# Patient Record
Sex: Male | Born: 1935 | Race: White | Hispanic: No | Smoking: Former smoker
Health system: Southern US, Community
[De-identification: ages and names within clinical notes are randomized; demographics above are authoritative.]

## PROBLEM LIST (undated history)

## (undated) DIAGNOSIS — I10 Essential (primary) hypertension: Secondary | ICD-10-CM

## (undated) DIAGNOSIS — R001 Bradycardia, unspecified: Secondary | ICD-10-CM

## (undated) DIAGNOSIS — J189 Pneumonia, unspecified organism: Secondary | ICD-10-CM

## (undated) DIAGNOSIS — I110 Hypertensive heart disease with heart failure: Secondary | ICD-10-CM

## (undated) DIAGNOSIS — R2681 Unsteadiness on feet: Secondary | ICD-10-CM

## (undated) DIAGNOSIS — I2510 Atherosclerotic heart disease of native coronary artery without angina pectoris: Secondary | ICD-10-CM

## (undated) DIAGNOSIS — N529 Male erectile dysfunction, unspecified: Secondary | ICD-10-CM

## (undated) DIAGNOSIS — R413 Other amnesia: Secondary | ICD-10-CM

## (undated) DIAGNOSIS — Z86010 Personal history of colonic polyps: Secondary | ICD-10-CM

## (undated) DIAGNOSIS — J309 Allergic rhinitis, unspecified: Secondary | ICD-10-CM

## (undated) DIAGNOSIS — Z9181 History of falling: Secondary | ICD-10-CM

## (undated) DIAGNOSIS — M6281 Muscle weakness (generalized): Secondary | ICD-10-CM

## (undated) DIAGNOSIS — R600 Localized edema: Secondary | ICD-10-CM

## (undated) DIAGNOSIS — K2100 Gastro-esophageal reflux disease with esophagitis, without bleeding: Secondary | ICD-10-CM

## (undated) DIAGNOSIS — N400 Enlarged prostate without lower urinary tract symptoms: Secondary | ICD-10-CM

## (undated) DIAGNOSIS — Z9889 Other specified postprocedural states: Secondary | ICD-10-CM

## (undated) DIAGNOSIS — R51 Headache: Secondary | ICD-10-CM

## (undated) DIAGNOSIS — R42 Dizziness and giddiness: Secondary | ICD-10-CM

## (undated) DIAGNOSIS — I5032 Chronic diastolic (congestive) heart failure: Secondary | ICD-10-CM

## (undated) DIAGNOSIS — E559 Vitamin D deficiency, unspecified: Secondary | ICD-10-CM

## (undated) DIAGNOSIS — H9190 Unspecified hearing loss, unspecified ear: Secondary | ICD-10-CM

## (undated) DIAGNOSIS — K635 Polyp of colon: Secondary | ICD-10-CM

## (undated) DIAGNOSIS — A419 Sepsis, unspecified organism: Secondary | ICD-10-CM

## (undated) DIAGNOSIS — K649 Unspecified hemorrhoids: Secondary | ICD-10-CM

## (undated) DIAGNOSIS — I340 Nonrheumatic mitral (valve) insufficiency: Secondary | ICD-10-CM

## (undated) DIAGNOSIS — M150 Primary generalized (osteo)arthritis: Secondary | ICD-10-CM

## (undated) DIAGNOSIS — A048 Other specified bacterial intestinal infections: Secondary | ICD-10-CM

## (undated) DIAGNOSIS — E785 Hyperlipidemia, unspecified: Secondary | ICD-10-CM

## (undated) DIAGNOSIS — K648 Other hemorrhoids: Secondary | ICD-10-CM

## (undated) DIAGNOSIS — G9341 Metabolic encephalopathy: Secondary | ICD-10-CM

## (undated) DIAGNOSIS — E876 Hypokalemia: Secondary | ICD-10-CM

## (undated) HISTORY — DX: Unsteadiness on feet: R26.81

## (undated) HISTORY — DX: Atherosclerotic heart disease of native coronary artery without angina pectoris: I25.10

## (undated) HISTORY — DX: Hypokalemia: E87.6

## (undated) HISTORY — DX: Allergic rhinitis, unspecified: J30.9

## (undated) HISTORY — DX: Localized edema: R60.0

## (undated) HISTORY — DX: Vitamin D deficiency, unspecified: E55.9

## (undated) HISTORY — PX: APPENDECTOMY: SHX54

## (undated) HISTORY — DX: Metabolic encephalopathy: G93.41

## (undated) HISTORY — DX: Unspecified hemorrhoids: K64.9

## (undated) HISTORY — DX: Unspecified hearing loss, unspecified ear: H91.90

## (undated) HISTORY — DX: Dizziness and giddiness: R42

## (undated) HISTORY — DX: Polyp of colon: K63.5

## (undated) HISTORY — PX: OTHER SURGICAL HISTORY: SHX169

## (undated) HISTORY — DX: Other specified postprocedural states: Z98.890

## (undated) HISTORY — DX: Muscle weakness (generalized): M62.81

## (undated) HISTORY — PX: TONSILLECTOMY: SUR1361

## (undated) HISTORY — DX: Hyperlipidemia, unspecified: E78.5

## (undated) HISTORY — DX: Gastro-esophageal reflux disease with esophagitis, without bleeding: K21.00

## (undated) HISTORY — DX: Primary generalized (osteo)arthritis: M15.0

## (undated) HISTORY — DX: Other hemorrhoids: K64.8

## (undated) HISTORY — PX: UPPER GASTROINTESTINAL ENDOSCOPY: SHX188

## (undated) HISTORY — DX: Chronic diastolic (congestive) heart failure: I50.32

## (undated) HISTORY — DX: Benign prostatic hyperplasia without lower urinary tract symptoms: N40.0

## (undated) HISTORY — DX: Pneumonia, unspecified organism: J18.9

## (undated) HISTORY — DX: Hypertensive heart disease with heart failure: I11.0

## (undated) HISTORY — PX: HEMORROIDECTOMY: SUR656

## (undated) HISTORY — DX: Male erectile dysfunction, unspecified: N52.9

## (undated) HISTORY — DX: Bradycardia, unspecified: R00.1

## (undated) HISTORY — DX: Other amnesia: R41.3

## (undated) HISTORY — PX: COLONOSCOPY: SHX174

## (undated) HISTORY — DX: Personal history of colonic polyps: Z86.010

## (undated) HISTORY — DX: Essential (primary) hypertension: I10

## (undated) HISTORY — DX: History of falling: Z91.81

## (undated) HISTORY — PX: CATARACT EXTRACTION, BILATERAL: SHX1313

## (undated) HISTORY — DX: Other specified bacterial intestinal infections: A04.8

## (undated) HISTORY — DX: Nonrheumatic mitral (valve) insufficiency: I34.0

## (undated) HISTORY — DX: Gastro-esophageal reflux disease with esophagitis: K21.0

## (undated) HISTORY — PX: NASAL SEPTUM SURGERY: SHX37

## (undated) HISTORY — DX: Headache: R51

---
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
## 1995-06-17 HISTORY — PX: CARDIAC CATHETERIZATION: SHX172

## 1998-03-06 ENCOUNTER — Ambulatory Visit (HOSPITAL_COMMUNITY): Admission: RE | Admit: 1998-03-06 | Discharge: 1998-03-06 | Payer: Self-pay | Admitting: Internal Medicine

## 1998-03-06 ENCOUNTER — Encounter: Payer: Self-pay | Admitting: Internal Medicine

## 2000-05-26 ENCOUNTER — Ambulatory Visit (HOSPITAL_BASED_OUTPATIENT_CLINIC_OR_DEPARTMENT_OTHER): Admission: RE | Admit: 2000-05-26 | Discharge: 2000-05-26 | Payer: Self-pay | Admitting: General Surgery

## 2000-05-26 ENCOUNTER — Encounter (INDEPENDENT_AMBULATORY_CARE_PROVIDER_SITE_OTHER): Payer: Self-pay | Admitting: *Deleted

## 2001-12-29 ENCOUNTER — Ambulatory Visit (HOSPITAL_COMMUNITY): Admission: RE | Admit: 2001-12-29 | Discharge: 2001-12-29 | Payer: Self-pay | Admitting: *Deleted

## 2001-12-29 ENCOUNTER — Encounter (INDEPENDENT_AMBULATORY_CARE_PROVIDER_SITE_OTHER): Payer: Self-pay | Admitting: *Deleted

## 2002-03-30 ENCOUNTER — Ambulatory Visit (HOSPITAL_COMMUNITY): Admission: RE | Admit: 2002-03-30 | Discharge: 2002-03-30 | Payer: Self-pay | Admitting: *Deleted

## 2004-06-16 HISTORY — PX: OTHER SURGICAL HISTORY: SHX169

## 2004-06-16 HISTORY — PX: INGUINAL HERNIA REPAIR: SUR1180

## 2004-11-18 ENCOUNTER — Encounter: Admission: RE | Admit: 2004-11-18 | Discharge: 2004-11-18 | Payer: Self-pay | Admitting: Orthopedic Surgery

## 2004-12-11 ENCOUNTER — Ambulatory Visit (HOSPITAL_BASED_OUTPATIENT_CLINIC_OR_DEPARTMENT_OTHER): Admission: RE | Admit: 2004-12-11 | Discharge: 2004-12-12 | Payer: Self-pay | Admitting: General Surgery

## 2004-12-11 ENCOUNTER — Ambulatory Visit (HOSPITAL_COMMUNITY): Admission: RE | Admit: 2004-12-11 | Discharge: 2004-12-11 | Payer: Self-pay | Admitting: General Surgery

## 2004-12-31 ENCOUNTER — Inpatient Hospital Stay (HOSPITAL_COMMUNITY): Admission: RE | Admit: 2004-12-31 | Discharge: 2005-01-04 | Payer: Self-pay | Admitting: Orthopedic Surgery

## 2004-12-31 ENCOUNTER — Encounter (INDEPENDENT_AMBULATORY_CARE_PROVIDER_SITE_OTHER): Payer: Self-pay | Admitting: Specialist

## 2005-01-04 ENCOUNTER — Ambulatory Visit: Payer: Self-pay | Admitting: Internal Medicine

## 2007-06-17 DIAGNOSIS — Z860100 Personal history of colon polyps, unspecified: Secondary | ICD-10-CM

## 2007-06-17 DIAGNOSIS — K648 Other hemorrhoids: Secondary | ICD-10-CM

## 2007-06-17 DIAGNOSIS — Z9889 Other specified postprocedural states: Secondary | ICD-10-CM

## 2007-06-17 HISTORY — DX: Other hemorrhoids: K64.8

## 2007-06-17 HISTORY — DX: Other specified postprocedural states: Z98.890

## 2007-06-17 HISTORY — DX: Personal history of colon polyps, unspecified: Z86.0100

## 2007-06-17 HISTORY — DX: Personal history of colonic polyps: Z86.010

## 2008-01-07 ENCOUNTER — Ambulatory Visit: Admission: RE | Admit: 2008-01-07 | Discharge: 2008-01-07 | Payer: Self-pay | Admitting: Orthopedic Surgery

## 2008-01-07 ENCOUNTER — Encounter (INDEPENDENT_AMBULATORY_CARE_PROVIDER_SITE_OTHER): Payer: Self-pay | Admitting: Orthopedic Surgery

## 2008-01-07 ENCOUNTER — Ambulatory Visit: Payer: Self-pay | Admitting: Surgery

## 2008-04-13 ENCOUNTER — Ambulatory Visit (HOSPITAL_COMMUNITY): Admission: RE | Admit: 2008-04-13 | Discharge: 2008-04-13 | Payer: Self-pay | Admitting: *Deleted

## 2008-04-13 ENCOUNTER — Encounter (INDEPENDENT_AMBULATORY_CARE_PROVIDER_SITE_OTHER): Payer: Self-pay | Admitting: *Deleted

## 2010-08-01 ENCOUNTER — Ambulatory Visit (INDEPENDENT_AMBULATORY_CARE_PROVIDER_SITE_OTHER): Payer: Medicare Other | Admitting: Cardiovascular Disease

## 2010-08-01 DIAGNOSIS — I119 Hypertensive heart disease without heart failure: Secondary | ICD-10-CM

## 2010-08-01 DIAGNOSIS — I498 Other specified cardiac arrhythmias: Secondary | ICD-10-CM

## 2010-08-01 DIAGNOSIS — E7800 Pure hypercholesterolemia, unspecified: Secondary | ICD-10-CM

## 2010-10-25 ENCOUNTER — Encounter: Payer: Self-pay | Admitting: Cardiovascular Disease

## 2010-10-25 DIAGNOSIS — I2510 Atherosclerotic heart disease of native coronary artery without angina pectoris: Secondary | ICD-10-CM | POA: Insufficient documentation

## 2010-10-25 DIAGNOSIS — I10 Essential (primary) hypertension: Secondary | ICD-10-CM | POA: Insufficient documentation

## 2010-10-25 DIAGNOSIS — E785 Hyperlipidemia, unspecified: Secondary | ICD-10-CM | POA: Insufficient documentation

## 2010-10-25 DIAGNOSIS — R51 Headache: Secondary | ICD-10-CM | POA: Insufficient documentation

## 2010-10-25 DIAGNOSIS — R519 Headache, unspecified: Secondary | ICD-10-CM | POA: Insufficient documentation

## 2010-10-29 ENCOUNTER — Encounter: Payer: Self-pay | Admitting: Cardiovascular Disease

## 2010-10-29 ENCOUNTER — Ambulatory Visit (INDEPENDENT_AMBULATORY_CARE_PROVIDER_SITE_OTHER): Payer: Medicare Other | Admitting: Cardiovascular Disease

## 2010-10-29 VITALS — BP 128/84 | HR 46 | Ht 67.0 in | Wt 170.4 lb

## 2010-10-29 DIAGNOSIS — I10 Essential (primary) hypertension: Secondary | ICD-10-CM

## 2010-10-29 DIAGNOSIS — I2510 Atherosclerotic heart disease of native coronary artery without angina pectoris: Secondary | ICD-10-CM

## 2010-10-29 NOTE — Assessment & Plan Note (Signed)
The Bystolic was apparently too expensive. He went back on the bisoprolol and cut the pill in half to 5 mg a day. He seems to be tolerating that fairly well. We'll continue with his current medications and I'll see him in 6 months.

## 2010-10-29 NOTE — Progress Notes (Signed)
[name] [name] Date of Birth  [birth year] [facility] / [facility] [address]     [address] [place], [place]  [address] [phone]  Fax  [phone]  History of Present Illness:  Elderly man with a history of moderate CAD by cath 20 years ago. Hx. Of htn and hyperlipidemia.  Has had a cold / laryngitis this past month. No syncope. No chest pain or dyspnea.   Current Outpatient Prescriptions on File Prior to Visit  Medication Sig Dispense Refill  . amLODipine (NORVASC) 10 MG tablet Take 10 mg by mouth daily.        Marland Kitchen aspirin 325 MG tablet Take 325 mg by mouth daily. 1/2 DAILY       . atorvastatin (LIPITOR) 10 MG tablet Take 10 mg by mouth 2 (two) times a week.       . Cholecalciferol (VITAMIN D3) 1000 UNITS CAPS Take by mouth daily.        . Coenzyme Q10 (COQ10) 100 MG CAPS Take by mouth daily.        . finasteride (PROSCAR) 5 MG tablet Take 5 mg by mouth daily.        Marland Kitchen lisinopril-hydrochlorothiazide (PRINZIDE,ZESTORETIC) 20-25 MG per tablet Take 1 tablet by mouth daily.        . Multiple Vitamin (MULTIVITAMIN) tablet Take 1 tablet by mouth daily.        . Multiple Vitamins-Minerals (ZINC PO) Take by mouth 3 (three) times a week.        Marland Kitchen NIACIN PO Take by mouth 4 (four) times a week. 5 days week      . DISCONTD: bisoprolol (ZEBETA) 5 MG tablet Take 5 mg by mouth daily.          No Known Allergies  Past Medical History  Diagnosis Date  . Coronary artery disease     MODERATE  . Hyperlipidemia   . Hypertension   . Headache     Past Surgical History  Procedure Date  . Cardiac catheterization 1997    REVEALED MILD TO MODERATE IRREGULARITIES. HIS LEFT EVNTRICULAR SYSTOLIC FUNCTION REVEALED NORMAL EJECTION FRACTION WITH EF OF 70%  . Appendectomy   . Inguinal hernia repair   . Tonsillectomy   . Hemorroidectomy     History  Smoking status  . Former Smoker  . Quit date: 10/25/1975  Smokeless tobacco  . Not on file    History    Alcohol Use No    No family history on file.  Reviw of Systems:  Reviewed in the HPI.  All other systems are negative.  Physical Exam: BP 128/84  Pulse 46  Ht 5\' 7"  (1.702 m)  Wt 170 lb 6.4 oz (77.293 kg)  BMI 26.69 kg/m2 The patient is alert and oriented x 3.  The mood and affect are normal.  The skin is warm and dry.  Color is normal.  The HEENT exam reveals that the sclera are nonicteric.  The mucous membranes are moist.  The carotids are 2+ without bruits.  There is no thyromegaly.  There is no JVD.  The lungs are clear.  The chest wall is non tender.  The heart exam reveals a regular rate with a normal S1 and S2.  There are no murmurs, gallops, or rubs.  The PMI is not displaced.   Abdominal exam reveals good bowel sounds.  There is no guarding or rebound.  There is no hepatosplenomegaly or tenderness.  There are no masses.  Exam of the legs reveal no clubbing, cyanosis, or edema.  The legs are without rashes.  The distal pulses are intact.  Cranial nerves II - XII are intact.  Motor and sensory functions are intact.  The gait is normal.  ECG: His EKG reveals sinus bradycardia. Otherwise there are no ST or T wave changes.  Assessment / Plan:

## 2010-10-29 NOTE — Op Note (Signed)
NAME:  [name] [name], DYGERT NO.:  [id number]   MEDICAL RECORD NO.:  [id number]          PATIENT TYPE:  AMB   LOCATION:  ENDO                         FACILITY:  [facility]   PHYSICIAN:  Georgiana Spinner, M.D.    DATE OF BIRTH:  05-26-1925   DATE OF PROCEDURE:  04/13/2008  DATE OF DISCHARGE:                               OPERATIVE REPORT   PROCEDURE:  Colonoscopy.   INDICATIONS:  Colon polyps.   ANESTHESIA:  Fentanyl 75 mcg, Versed 7 mg.   DESCRIPTION OF PROCEDURE:  With the patient mildly sedated in the left  lateral decubitus position, the Pentax videoscopic colonoscope was  inserted in the rectum after normal rectal exam and passed under direct  vision to the cecum identified by ileocecal valve and appendiceal  orifice both of which were photographed.  From this point the  colonoscope was slowly withdrawn taking circumferential views of colonic  mucosa stopping first in the cecum where a polyp was seen, photographed  and removed using hot biopsy forceps technique setting of 20/150 blended  current.  We next stopped in the rectum which appeared normal on direct  showed hemorrhoids on retroflexed view.  The endoscope was straightened  and withdrawn.  The patient's vital signs and pulse oximeter remained  stable.  The patient tolerated the procedure well without apparent  complications.   FINDINGS:  1. Polyp of cecum.  2. Internal hemorrhoids.  3. Otherwise unremarkable exam.   PLAN:  Await biopsy report.  The patient will call me for results and  follow-up with me as an outpatient.           ______________________________  Georgiana Spinner, M.D.     GMO/MEDQ  D:  04/13/2008  T:  04/13/2008  Job:  [id number]

## 2010-10-29 NOTE — Assessment & Plan Note (Signed)
[name] [name] is doing very well. He has not had any episodes of angina. I've asked him to continue with his same medications.

## 2010-11-01 NOTE — Op Note (Signed)
NAME:  [name] [name], [name] [name] NO.:  [id number]   MEDICAL RECORD NO.:  [id number]          PATIENT TYPE:  OUT   LOCATION:  DFTL                         FACILITY:  [facility]   PHYSICIAN:  Sharlet Salina T. Hoxworth, M.D.DATE OF BIRTH:  1925/04/09   DATE OF PROCEDURE:  12/11/2004  DATE OF DISCHARGE:  12/11/2004                                 OPERATIVE REPORT   PRE AND POSTOPERATIVE DIAGNOSIS:  Left inguinal hernia.   SURGICAL PROCEDURES:  Open repair left inguinal hernia.   SURGEON:  Dr. Johna Sheriff.   ANESTHESIA:  Laryngeal mask general.   BRIEF HISTORY:  [name] is a 77 year old male who presents with a  progressively enlarging symptomatic left inguinal hernia confirmed by exam.  He has previous history of right inguinal hernia repair. We have recommended  proceeding with repair under general anesthesia with mesh. This procedure,  indications, risks of bleeding, infection, recurrence were discussed and  understood.  He is now brought to the operating room for this procedure.   DESCRIPTION OF PROCEDURE:  The patient was brought to the operating room and  placed in supine position on the operating table. General orotracheal  anesthesia was induced. The left groin was sterilely prepped and draped. He  received preoperative antibiotics. Oblique incision made and dissection  carried down through subcutaneous tissue using cautery. External oblique was  identified divided along the lines of its  fibers to the external ring. The  ilioinguinal nerve was identified, dissected and protected. The cord was  dissected up off the floor at the pubic tubercle. Cremasteric fibers were  divided bilaterally with cautery up to the internal ring completely freeing  the cord. Dissection of the cord revealed a good size cord lipoma that was  excised.  There was also an indirect sac which was dissected free from cord  structures using cautery and blunt dissection and then suture-ligated,  divided,  and removed at or above the level of the internal ring. The floor  was relatively intact. A piece of Parietex polyester mesh was used and  trimmed to size to fit the floor of the inguinal canal with tails to go  around the cord to the internal ring. The mesh was sutured initially to the  pubic tubercle and then to the inguinal ligament and iliopubic tract with  running of 2-0 Prolene. Medially the mesh was sutured to the edge of the  rectus sheath with interrupted 2-0 Prolene. Tails were then tacked together  lateral to the cord with interrupted 2-0 Prolene. This provided nice broad  coverage of the direct and indirect spaces with the internal ring snug to  the fingertip. The cord and ilioinguinal nerves were returned to the  anatomic position. Complete hemostasis was assured. Soft tissue was then  infiltrated with Marcaine. The external oblique was closed with running 3-0  Vicryl. Scarpas fascia was closed with running 3-0 Vicryl, skin closed with  running subcuticular 4-0 Monocryl and Steri-Strips.  Sponge, needle and  instrument counts were correct.  Dry sterile dressings were applied. The  patient taken to recovery room in good condition.      BTH/MEDQ  D:  12/24/2004  T:  12/24/2004  Job:  [id number]

## 2010-11-01 NOTE — Consult Note (Signed)
NAME:  [name], [name] NO.:  [id number]   MEDICAL RECORD NO.:  [id number]          PATIENT TYPE:  INP   LOCATION:  1509                         FACILITY:  [facility]   PHYSICIAN:  Rozanna Boer., M.D.DATE OF BIRTH:  1925/04/16   DATE OF CONSULTATION:  01/03/2005  DATE OF DISCHARGE:                                   CONSULTATION   REASON FOR CONSULTATION:  Urinary retention postop back surgery.   HISTORY OF PRESENT ILLNESS:  A 77 year old patient who had back surgery on  01/01/2005 who has been unable to void since then.  He has had  catheterizations for at least 1400 cc on one occasion.  He has been unable  to void, just dribbles postoperatively.  He had a left inguinal hernia  repair just three weeks ago but apparently did not have any trouble voiding  after that but was constipated.  I have seen him in the office in the past.  The last was 05/23/2004.  This was the first time I had seen him in a little  over a year.  He was having some soreness of his left scrotum at that time.  He says prior to the hernia and back surgery, he had had a little bit more  frequency and urgency.  He was getting up at least two times a night and a  stream that noticeably slowed in the last four-six months.  He denies a  previous history of hematuria, urinary tract infections, or previous  retention.   MEDICINES:  Include:  1.  Atenolol.  2.  Hydrochlorothiazide.  3.  Lipitor.  4.  Occasional Levitra.   PREVIOUS SURGERIES:  Include:  1.  Appendectomy at age 87.  2.  Vasectomy five years ago.  3.  Hemorrhoidectomy in 1970.  4.  Lipomas of his back.  5.  Right inguinal hernia in 1984.  6.  Left inguinal hernia in 11/2004.  7.  Back surgery 01/01/2005.   REVIEW OF SYSTEMS, FAMILY HISTORY, SOCIAL HISTORY:  Pretty unrevealing.  He  has high blood pressure but never had a heart attack.  He did have a cardiac  catheterization many years ago.  He used to work at [employer].  He  does have some chronic constipation.  No irritable bowel.  No diabetes.  No  history of kidney stones.  He has one son and two daughters.  His son is 9  and his daughters are 43 and 54.  His daughter and son have had kidney  stones.  He has two sisters and one brother.  Both parents are deceased.  His mother had carcinoma of the colon at age 44.  Father with complications  of breathing problems at age 43.  He quit smoking in 1971.  He rarely drinks  alcohol.  He does have a little mild ED, for which he takes Levitra.   PHYSICAL EXAMINATION:  VITAL SIGNS:  His blood pressure is 130/65, pulse 68.  Weight is 175.  GENERAL:  He is a pleasant white gentleman in no acute distress but moves  very slowly due to his  back.  HEENT:  Clear.  NECK:  Supple.  LUNGS:  Clear.  ABDOMEN:  Soft.  Liver and spleen are not palpable.  Bladder  is distended.  GENITALIA:  The penis is uncircumcised.  He has bilaterally descended testes  without tenderness.  Epididymis non-tender.  RECTAL:  His prostate is about 45 g in size on rectal palpation.  No  induration, no tenderness.  EXTREMITIES:  Faint distal pulses.  Intact sensation and light touch.  No  edema.   ADDENDUM:  A Foley catheter was inserted and over 1000 cc of clear urine was  obtained.  This was left attached to a drainage bag.   IMPRESSION:  1.  Urinary retention.  2.  Obstructive uropathy postoperative back surgery.   RECOMMENDATIONS:  Leave Foley catheter for at least one week.  He will come  to my office at that time for a voiding trial.  Then 12 hours before I see  him he will remove his Foley catheter.  The patient was instructed how to do  this.  He will use a leg bag during the day and a large drainage bag at  night.  He will be covered with antibiotics when the catheter is finally  removed but it may take a little bit more than a week, but we will keep him  on Flomax 0.4 mg a day until then.   ALLERGIES:  NO ALLERGIES  TO MEDICATIONS.       HMK/MEDQ  D:  01/03/2005  T:  01/03/2005  Job:  [id number]

## 2010-11-01 NOTE — Op Note (Signed)
NAME:  [name] [name], [name] [name] NO.:  [id number]   MEDICAL RECORD NO.:  [id number]          PATIENT TYPE:  INP   LOCATION:  1509                         FACILITY:  [facility]   PHYSICIAN:  [name] Kays, M.D.  DATE OF BIRTH:  [birth year]   DATE OF PROCEDURE:  12/31/2004  DATE OF DISCHARGE:                                 OPERATIVE REPORT   PREOPERATIVE DIAGNOSES:  1.  Spinal stenosis L3-4, L4-5.  2.  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   POSTOPERATIVE DIAGNOSES:  1.  Spinal stenosis L3-4, L4-5.  2.  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   OPERATION:  1.  Central decompressive laminectomy L3-4, L4-5.  2.  Microdiskectomy and removal of free fragment L4-5 right.   SURGEON:  Dr. Simonne Come.   ASSISTANT:  Dr. Worthy Rancher.   ANESTHESIA:  General.   PATHOLOGY AND JUSTIFICATION FOR PROCEDURE:  He has had a long history of  back problems, with the most recent one being a flare-up of pain after doing  some sit-ups in April of this year.  MRI demonstrated multilevel canal  stenosis with a free fragment of disk material just cephalad at L4-5 on the  right.  Because of the spinal stenosis, I had him undergo a lumbar myelogram  which demonstrated mainly spinal stenosis at L3-4 and L4-5.  There was some  narrowing of the spinal canal throughout, in the upper lumbar spine as well,  but this was mainly on the lateral projection.  On the AP projection, there  was no significant spinal stenosis, and consequently I felt that we could  check him intraoperatively, but probably he would not need decompression  above at the L3-4 level.   PROCEDURE:  Prophylactic antibiotics, satisfactory general anesthesia, knee-  chest position on the Boyd frame.  Back was prepped with DuraPrep and  with three spinal needles and a lateral x-ray, we tentatively located the  interval we wanted from L3-L5.  Then continued draping the back in a sterile  field.   Made my initial incision in the midline and tagged two spinous  processes which actually turned out to be superior to the spinous process of  L4 and inferior with Kocher clamps.  Based on this, we increased our  incision both proximally and distally to expose L3 and L5.  Soft tissue was  dissected off the neural arches of all these levels, and two self-retaining  McCullough retractors were placed.  With double-action rongeur, I then  removed spinous process and a bit of the neural arch of L4 and also a  substantial portion of the inferior portion of L3 and superior portion of  L5.  Then with a combination of 2, 3, and 4 mm Kerrison rongeurs and double  action rongeur, we began removing bone ligamentum flavum centrally, working  laterally cephalad and caudad.  When we had done the gross decompression, we  brought in the microscope to complete the decompression until he had been  thoroughly decompressed around all four sides.  Then identified the L4-5  disk space based on prior x-ray and mobilized the L5  nerve root and dura  medially.  The disk was identified; bleeders were coagulated with bipolar  cautery, and I opened it with a 15 knife blade assisted by Epstein curette  and nerve hook.  I removed a small amount of disk material from the  interspace, and we then went searching for the free fragment of disk  material which we were able to locate up underneath the L4 nerve root and  over the body of L4, teasing it out with nerve hook and Epstein curette.  There were multiple fragments sent to pathology.  When we were satisfied  that we had removed all free fragment of disk material and rechecked the  interspace and removed all disk material from there, I irrigated the wound  well with sterile saline and placed Gelfoam soaked in thrombin in the bed of  the disk area.  Self-retaining retractors were removed.  I placed a quarter-  inch Penrose drain through the right posterior flank covering the  dura, then  closed the wound carefully over the drain with interrupted #1 Vicryl in the  paralumbar muscle and fascia, 2-0 Vicryl in subcutaneous tissue, and staples  in the skin.  He was given 15 mg of Toradol IV on closing.  The estimated  blood loss was perhaps 500 mL.  No blood replacement.       JA/MEDQ  D:  12/31/2004  T:  12/31/2004  Job:  [id number]

## 2010-11-01 NOTE — Procedures (Signed)
[facility]  Patient:    BROC, CASPERS Visit Number: [id number] MRN: [id number]          Service Type: END Location: ENDO Attending Physician:  Sabino Gasser Dictated by:   Sabino Gasser, M.D. Proc. Date: 12/29/01 Admit Date:  12/29/2001 Discharge Date: 12/29/2001                             Procedure Report  PROCEDURE:  Colonoscopy.  INDICATION FOR PROCEDURE:  Colon polyps.  ANESTHESIA:  Versed 1 mg.  DESCRIPTION OF PROCEDURE:  With the patient mildly sedated in the left lateral decubitus position, the Olympus videoscopic colonoscope was inserted in the rectum and passed after a normal rectal examination to the cecum identified by the ileocecal valve and appendiceal orifice. From this point, the colonoscope was slowly withdrawn taking circumferential views of the entire colonic mucosa, stopping at 15 cm from the anal verge at which point a pearly polyp was seen, photographed, and removed using hot biopsy forceps technique. I pulled all the way back to the rectum which appeared normal in direct and showed a small polyp on retroflexed view which also was removed using hot biopsy forceps technique on a setting of 20:20 blended current. The endoscope was straightened and withdrawn. The patients vital signs and pulse oximeter remained stable. The patient tolerated the procedure well without apparent complications.  FINDINGS:  Two small polyps, one at 15 cm and one just above the anal verge removed. Await biopsy report. The patient will call me for results and followup with me as an outpatient. Dictated by:   Sabino Gasser, M.D. Attending Physician:  Sabino Gasser DD:  12/29/01 TD:  01/01/02 Job: [id number]

## 2010-11-01 NOTE — Discharge Summary (Signed)
NAME:  MADIX, BLOWE NO.:  [id number]   MEDICAL RECORD NO.:  [id number]          PATIENT TYPE:  INP   LOCATION:  1509                         FACILITY:  [facility]   PHYSICIAN:  [name] Kays, M.D.  DATE OF BIRTH:  1925/05/20   DATE OF ADMISSION:  12/31/2004  DATE OF DISCHARGE:  01/04/2005                                 DISCHARGE SUMMARY   ADMITTING DIAGNOSES:  1.  Spinal stenosis L3-L4. L4-L5 with herniated nucleus pulposus on the      right with extruded fragment L4-5.  2.  Hypertension.  3.  Hiatal hernia.  4.  Dyslipidemia.   DISCHARGE DIAGNOSES:  1.  Spinal stenosis L3-L4. L4-L5 with herniated nucleus pulposus on the      right with extruded fragment L4-5.  2.  Hypertension.  3.  Hiatal hernia.  4.  Dyslipidemia.  5.  Urinary retention postoperative back surgery.   CONSULTS:  Dr. Courtney Paris for urology.   OPERATION:  On December 31, 2004 the patient underwent central decompressive  lumbar laminectomy L3-L4, L4-L5 with microdiskectomy, removal of free  fragment from L4-5 on the right. Dr. Ranee Gosselin assisted.   BRIEF HISTORY:  This 77 year old male with continuing progressive problems  concerning pain in his lumbar spine with radiating into the buttocks and  right leg. He has noticed weakness and a tendency for it to give out. The  patient's routine x-ray showed spondylolisthesis L3 on 4 and L4 on 5. CT  scan showed that he had stenosis at the same levels. Extruded fragment noted  at L4-5. Due to these positive findings and the fact this patient had  progressive problems and after risks and benefits were described to the  patient, he underwent the above procedure.   COURSE IN THE HOSPITAL:  The patient tolerated the surgical procedure quite  well. Early on postoperatively he developed hiccups and diaphragm spasms.  His abdomen was tender, distended, firm with few bowel sounds. We started  the ileus protocol right away. KUB was done to the  abdomen and diffuse  gaseous distension to colon was seen. This was showed consistency with  ileus. We continued on ileus protocol postoperatively. Also noted that the  patient had frequency with minimal amount of urination and had to be  catheterized with large amount of urine in the bladder up to 1400 cc. We  asked Dr. Vic Blackbird to see the patient who had previously cared for  the patient. He consulted and agreed with the Flomax that we had started. He  also put an indwelling Foley. We also had to call a GI consult, Dr. Leone Payor.  Dulcolax suppository was used as well as increasing his level of ambulation  and the patient had a large BM on January 04, 2005. Once his GU and GI status  was stabilized, the ileus was relieved, and the urinary retention was taken  care of, it was decided he could be discharged on that account.   As far as his physical therapy and his recovery from back surgery, he did  really quite well. The drain was removed with no difficulty. He  had minimal  amount of drainage to his back. He had a marked relief in pain in his lower  extremities. On the day of discharge, orthopedically, he was really quite  stable, lower extremities were moving well without pain. Wound was dry as  well. Vital signs were stable. His wife was in the room and we reviewed the  discharge plan with her prior to discharge.   LABORATORY VALUES IN THE HOSPITAL:  Hematologically showed CBC with  differential which was within normal limits. Urinalysis negative for urinary  tract infection. Chest x-ray showed no acute cardiopulmonary process. KUB  showed as above. No EKGs seen.   CONDITION ON DISCHARGE:  Improved, stable.   PLAN:  The patient discharged to his home. We will keep his analgesics at a  minimum. He is given Darvocet-N for discomfort and Robaxin 500 mg for muscle  relaxant. He may shower when the drainage stops from his back, use dry  dressing p.r.n. Return to see Dr. Simonne Come in  2 weeks. He is to follow up  with Dr. Vic Blackbird. He is to keep his catheter until 12 hours before  his visit with Dr. Aldean Ast and then remove the catheter. Instructions are  given by the nurse. He is to follow up with gastroenterology per their  instructions. Dr. Leone Payor saw the patient last. Call if any problems on all  the above accounts.      Druscilla Brownie   DLU/MEDQ  D:  01/16/2005  T:  01/16/2005  Job:  [id number]   cc:   Rozanna Boer., M.D.  [address]  [place]  [place] [address]  Fax: [phone]   Iva Boop, M.D. [facility]  [address] [place], [place] [address]

## 2010-11-01 NOTE — Op Note (Signed)
   NAME:  ELDRA, WORD NO.:  [id number]   MEDICAL RECORD NO.:  [id number]                   PATIENT TYPE:  AMB   LOCATION:  ENDO                                 FACILITY:  [facility]   PHYSICIAN:  Georgiana Spinner, M.D.                 DATE OF BIRTH:  04/28/1925   DATE OF PROCEDURE:  03/30/2002  DATE OF DISCHARGE:                                 OPERATIVE REPORT   PROCEDURE:  Upper endoscopy.   INDICATIONS:  Abdominal pain, follow-up of gastric ulcer.   ANESTHESIA:  Demerol 50, Versed 5 mg.   DESCRIPTION OF PROCEDURE:  With patient mildly sedated in the left lateral  decubitus position, the Olympus videoscopic endoscope was inserted in the  mouth, passed under direct vision through the esophagus, which appeared  normal, into the stomach.  Fundus, body, antrum, duodenal bulb, and second  portion of duodenum all appeared normal.  From this point, the endoscope was  slowly withdrawn, taking circumferential views of the entire duodenal mucosa  until the endoscope then pulled back into the stomach, placed in  retroflexion to view the stomach from below.  The endoscope was then  straightened and withdrawn, taking circumferential views of the remaining  gastric and esophageal mucosa.  The patient's vital signs and pulse oximeter  remained stable.  The patient tolerated the procedure well without apparent  complications.   FINDINGS:  Eradication of previous ulcer, status post treatment for H.  pylori.   PLAN:  Have patient follow up with me as needed.                                               Georgiana Spinner, M.D.    GMO/MEDQ  D:  03/30/2002  T:  03/30/2002  Job:  [id number]

## 2010-11-01 NOTE — H&P (Signed)
NAME:  [name] [name], [name] [name] NO.:  [id number]   MEDICAL RECORD NO.:  [id number]          PATIENT TYPE:  INP   LOCATION:  NA                           FACILITY:  [facility]   PHYSICIAN:  [name] Kays, M.D.  DATE OF BIRTH:  26-Feb-1925   DATE OF ADMISSION:  DATE OF DISCHARGE:                                HISTORY & PHYSICAL   DATE OF ADMISSION TO [facility]:  December 31, 2004   CHIEF COMPLAINT:  Pain in my back with pain and weakness in my right leg.   PRESENT ILLNESS:  This 77 year old white male with continuing and  progressive problems concerning pain into his lumbar spine radiating into  the buttocks and specifically on the right leg with feelings of giving out  of his right leg. The patient has had noticeable weakness in the right lower  extremity, has a tendency to drag the right leg. Negative straight leg  raising is negative bilaterally, as well as negative figure-of-four. Gross  sensation as well as motor strength is equal and good in the lower  extremities. X-rays in the office showed spondylolisthesis of L3 on L4 and  L4 on L5. We sent the patient for lumbar myelogram and CT scan on November 18, 2004 and it was noted that the patient had specific stenosis seen at the L3-  4 and L4-5 level. Also an extruded fragment was noted at C4-5 to the right.  Due to these positive findings and the fact that this is a very active  gentleman, and his overall lifestyle has deteriorated with function as well  as the pain and discomfort. After risks and benefits were discussed with the  patient it was felt he would benefit from surgical intervention and is being  admitted for central decompressive lumbar laminectomy L3-L4, L4-L5 with  microdiskectomy at L4-L5 on the right.   PAST MEDICAL HISTORY:  The patient has recently had a herniorrhaphy by Dr.  Johna Sheriff and Dr. Johna Sheriff feels that he can go ahead with this surgery. In  the past he has also had an appendectomy some 65  years ago,  hemorrhoidectomy, noncancerous tumors from his upper back. He is being  treated by Dr. Merri Brunette for hypertension, a hiatal hernia, and  dyslipidemia.   CURRENT MEDICATIONS:  1.  Lipitor 10 mg four times a week.  2.  Diovan 160 mg daily.  3.  Lotrel daily.   He has no drug allergies.   Again, Dr. Merri Brunette is his family physician, Dr. Elease Hashimoto is his  cardiologist, and Dr. Jaclynn Guarneri is his surgeon.   FAMILY HISTORY:  Positive for cancer.   SOCIAL HISTORY:  The patient is married, retired, has no intake of tobacco  products, no alcohol products, has three children. His wife will be the  primary caregiver after surgery. He lives in a two-story house with 14  stairs to the second level.   REVIEW OF SYSTEMS:  CNS:  No seizure disorder, paralysis, double vision. The  patient does have reported weakness in the right lower extremity.  CARDIOVASCULAR:  No chest pain, no angina or orthopnea.  RESPIRATORY:  No  productive cough, no hemoptysis, no shortness of breath. GASTROINTESTINAL:  No nausea, vomiting, melena, or bloody stools. GENITOURINARY:  The patient  has frequency; however, no dysuria or hematuria. MUSCULOSKELETAL:  Primarily  in present illness, but he has other mild arthralgias consistent with  osteoarthritis.   PHYSICAL EXAMINATION:  GENERAL:  Alert and cooperative, friendly 77 year old  white male looking somewhat younger than his stated age.  VITAL SIGNS:  Blood pressure 152/72, pulse 60, respirations are 12.  HEENT:  Normocephalic, PERRLA, EOM intact. Oropharynx is clear.  CHEST:  Clear to auscultation. No rhonchi, no rales.  HEART:  With regular rate and rhythm. There is a grade 3/6 holosystolic  murmur heard best at the right sternal border at the second interspace.  ABDOMEN:  Soft, nontender. Liver and spleen not felt.  GENITALIA AND RECTAL:  Not done, not pertinent to present illness.  EXTREMITIES:  As in present illness above.   ADMITTING  DIAGNOSES:  1.  Spinal stenosis L3-L4, L4-L5, with herniated nucleus pulposus on the      right with extruded fragment L4-L5.  2.  Hypertension.  3.  Hiatal hernia.  4.  Dyslipidemia.   PLAN:  The patient will be admitted for central decompressive lumbar  laminectomy at L3-L4, L4-L5, with microdiskectomy at L4-L5 on the right. If  we have any medical problems, we will certainly call upon Dr. Merri Brunette  or one of his associates to follow along with [place] during this  hospitalization, and should there be any cardiac events we will definitely  contact Dr. Kristeen Miss.      Druscilla Brownie   DLU/MEDQ  D:  12/26/2004  T:  12/26/2004  Job:  [id number]   cc:   Soyla Murphy. Renne Crigler, M.D.  [address]  [place]  [place] [address]  Fax: [phone]   Vesta Mixer, M.D.  [address], [address]  [place]  [place] [address]  Fax: [phone]   Lorne Skeens. Hoxworth, M.D.  [address]  [place]  [place] [address]

## 2010-11-01 NOTE — Procedures (Signed)
[facility]  Patient:    OMRAN, KEELIN Visit Number: [id number] MRN: [id number]          Service Type: END Location: ENDO Attending Physician:  Sabino Gasser Dictated by:   Sabino Gasser, M.D. Proc. Date: 12/29/01 Admit Date:  12/29/2001 Discharge Date: 12/29/2001                             Procedure Report  PROCEDURE:  Upper endoscopy.  INDICATION FOR PROCEDURE:  Gastroesophageal reflux disease.  ANESTHESIA:  Demerol 40, Versed 5 mg.  DESCRIPTION OF PROCEDURE:  With the patient mildly sedated in the left lateral decubitus position, the Olympus videoscopic endoscope was inserted in the mouth and passed under direct vision through the esophagus which appeared normal into the stomach. The fundus, body and antrum were visualized. The body showed ulcerations with eschars, photographed and subsequently biopsied. We entered into the duodenal bulb and second portion of the duodenum and both appeared normal. From this point, the endoscope was slowly withdrawn taking circumferential views of the entire duodenal mucosa until the endoscope was then pulled back into the stomach, placed in retroflexion to view the stomach from below. The endoscope was then straightened and withdrawn taking circumferential views of the remaining gastric and esophageal mucosa. The patients vital signs and pulse oximeter remained stable. The patient tolerated the procedure well without apparent complications.  FINDINGS:  Ulceration of body of stomach. Await biopsy report. The patient will call me for results and followup with me as an outpatient. Proceed to colonoscopy as planned. Dictated by:   Sabino Gasser, M.D. Attending Physician:  Sabino Gasser DD:  12/29/01 TD:  01/01/02 Job: [id number]

## 2010-11-27 ENCOUNTER — Encounter: Payer: Self-pay | Admitting: Internal Medicine

## 2011-01-08 ENCOUNTER — Other Ambulatory Visit: Payer: Self-pay | Admitting: Cardiovascular Disease

## 2011-01-08 NOTE — Telephone Encounter (Signed)
Fax received from pharmacy. Refill completed. Jodette Emylie Amster RN  

## 2011-01-09 ENCOUNTER — Ambulatory Visit (INDEPENDENT_AMBULATORY_CARE_PROVIDER_SITE_OTHER): Payer: Medicare Other | Admitting: Internal Medicine

## 2011-01-09 ENCOUNTER — Encounter: Payer: Self-pay | Admitting: Internal Medicine

## 2011-01-09 DIAGNOSIS — Z860100 Personal history of colon polyps, unspecified: Secondary | ICD-10-CM | POA: Insufficient documentation

## 2011-01-09 DIAGNOSIS — F419 Anxiety disorder, unspecified: Secondary | ICD-10-CM

## 2011-01-09 DIAGNOSIS — F411 Generalized anxiety disorder: Secondary | ICD-10-CM

## 2011-01-09 DIAGNOSIS — Z800 Family history of malignant neoplasm of digestive organs: Secondary | ICD-10-CM

## 2011-01-09 NOTE — Patient Instructions (Signed)
You should receive a letter from [place] by November 2012 - about scheduling colonoscopy. Call us in November if you have not, and you can arrange a routine repeat colonoscpy.

## 2011-01-09 NOTE — Progress Notes (Signed)
  Subjective:    Patient ID: [name] [name], male    DOB: 1925-05-23, 77 y.o.   MRN: [id number]  HPI Prior colon polyps - last adenoma of cecum removed 04/13/2008 He is concerned about this and mother's history of colon cancer x 2.  Feels like he is old but fit and likely to live a long time.  Review of Systems     Objective:   Physical Exam WDWN, elderly but vigorous Lungs clear Heart S1S2 no rmg Alert and oriented x 3       Assessment & Plan:

## 2011-01-09 NOTE — Assessment & Plan Note (Signed)
She was elderly but had 2 cancers. He is clearly traumatized by the memory of her dieting related to emergency surgery for colorectal cancer and he would like to avoid that.

## 2011-01-09 NOTE — Assessment & Plan Note (Signed)
Discussed with him this was small and removed he was told at one 2- 3 years by his previous gastroenterologist. I recommend he wait 3-5 years. He is quite concerned given his mother's history and has some anxiety over that so will plan on repeating before of this year, likely in November or December.

## 2011-02-27 ENCOUNTER — Other Ambulatory Visit: Payer: Self-pay | Admitting: Neurology

## 2011-02-27 DIAGNOSIS — E785 Hyperlipidemia, unspecified: Secondary | ICD-10-CM

## 2011-02-27 DIAGNOSIS — R42 Dizziness and giddiness: Secondary | ICD-10-CM

## 2011-02-28 ENCOUNTER — Ambulatory Visit
Admission: RE | Admit: 2011-02-28 | Discharge: 2011-02-28 | Disposition: A | Discharge status: Home / Self Care | Payer: Medicare Other | Source: Ambulatory Visit | Attending: Neurology | Admitting: Neurology

## 2011-02-28 DIAGNOSIS — R42 Dizziness and giddiness: Secondary | ICD-10-CM

## 2011-02-28 DIAGNOSIS — E785 Hyperlipidemia, unspecified: Secondary | ICD-10-CM

## 2011-05-01 ENCOUNTER — Encounter: Payer: Self-pay | Admitting: Internal Medicine

## 2011-05-22 ENCOUNTER — Encounter: Payer: Self-pay | Admitting: Internal Medicine

## 2011-05-22 ENCOUNTER — Ambulatory Visit (AMBULATORY_SURGERY_CENTER): Payer: Medicare Other

## 2011-05-22 VITALS — Ht 68.0 in | Wt 175.0 lb

## 2011-05-22 DIAGNOSIS — Z1211 Encounter for screening for malignant neoplasm of colon: Secondary | ICD-10-CM

## 2011-05-22 MED ORDER — PEG-KCL-NACL-NASULF-NA ASC-C 100 G PO SOLR: 1.0000 | Freq: Once | ORAL | Status: DC

## 2011-06-05 ENCOUNTER — Encounter: Payer: Self-pay | Admitting: Internal Medicine

## 2011-06-05 ENCOUNTER — Ambulatory Visit (AMBULATORY_SURGERY_CENTER): Payer: Medicare Other | Admitting: Internal Medicine

## 2011-06-05 VITALS — BP 133/64 | HR 54 | Temp 97.2°F | Resp 21 | Ht 68.0 in | Wt 175.0 lb

## 2011-06-05 DIAGNOSIS — Z800 Family history of malignant neoplasm of digestive organs: Secondary | ICD-10-CM

## 2011-06-05 DIAGNOSIS — Z1211 Encounter for screening for malignant neoplasm of colon: Secondary | ICD-10-CM

## 2011-06-05 DIAGNOSIS — Z86010 Personal history of colonic polyps: Secondary | ICD-10-CM

## 2011-06-05 DIAGNOSIS — D1330 Benign neoplasm of unspecified part of small intestine: Secondary | ICD-10-CM

## 2011-06-05 DIAGNOSIS — D126 Benign neoplasm of colon, unspecified: Secondary | ICD-10-CM

## 2011-06-05 DIAGNOSIS — K648 Other hemorrhoids: Secondary | ICD-10-CM

## 2011-06-05 MED ORDER — SODIUM CHLORIDE 0.9 % IV SOLN: 500.0000 mL | Freq: Once | INTRAVENOUS | Status: DC

## 2011-06-05 NOTE — Patient Instructions (Signed)
Two tiny benign-appearing polyps were removed. The largest polyp was only 3 mm. You have internal hemorrhoids - not usually a problem. Please read the information provided. I will send a letter with pathology results and recommendations. Iva Boop, MD, [facility]

## 2011-06-05 NOTE — Progress Notes (Signed)
Patient did not experience any of the following events: a burn prior to discharge; a fall within the facility; wrong site/side/patient/procedure/implant event; or a hospital transfer or hospital admission upon discharge from the facility. (G8907) Patient did not have preoperative order for IV antibiotic SSI prophylaxis. (G8918)  

## 2011-06-05 NOTE — Op Note (Signed)
[name] [address] [place], [place]  [address]  COLONOSCOPY PROCEDURE REPORT  PATIENT:  [name] [name], [name] [name]  MR#:  [id number] BIRTHDATE:  1924-10-14, 86 yrs. old  GENDER:  male ENDOSCOPIST:  Iva Boop, MD, [facility] REF. BY:  Soyla Murphy. Renne Crigler, M.D. PROCEDURE DATE:  06/05/2011 PROCEDURE:  Colonoscopy with biopsy ASA CLASS:  Class II INDICATIONS:  surveillance and high-risk screening, history of pre-cancerous (adenomatous) colon polyps, family history of colon cancer MEDICATIONS:   These medications were titrated to patient response per physician's verbal order, Fentanyl 50 mcg IV, Versed 5 mg IV  DESCRIPTION OF PROCEDURE:   After the risks benefits and alternatives of the procedure were thoroughly explained, informed consent was obtained.  Digital rectal exam was performed and revealed no abnormalities.   The [id number] endoscope was introduced through the anus and advanced to the cecum, which was identified by both the appendix and ileocecal valve, without limitations.  The quality of the prep was good, using MoviPrep. The instrument was then slowly withdrawn as the colon was fully examined. <<PROCEDUREIMAGES>>  FINDINGS:  Two polyps were found. Diminutive polyps cecum (1mm) and sigmoid (3mm). The polyps were removed using cold biopsy forceps.  This was otherwise a normal examination of the colon. Retroflexed views in the rectum revealed internal hemorrhoids. The time to cecum = 4:38 minutes. The scope was then withdrawn in 11:38 minutes from the cecum and the procedure completed. COMPLICATIONS:  None ENDOSCOPIC IMPRESSION: 1) Two diminutive polyps removed, largest 3 mm. 2) Otherwise normal colon examination, good prep 3) Internal hemorrhoids in the rectum RECOMMENDATIONS: Will notify of results and plans with a letter. He has probably reached maximal benefit of screening colonoscopy at his age (though his very fit). REPEAT EXAM:  No  Iva Boop,  MD, [facility] Graham  CC:  Romero Liner, MD and The Patient  n. eSIGNED:   Iva Boop at 06/05/2011 11:48 AM  Amado Nash, [id number]

## 2011-06-06 ENCOUNTER — Telehealth: Payer: Self-pay | Admitting: *Deleted

## 2011-06-06 NOTE — Telephone Encounter (Signed)

## 2011-06-09 ENCOUNTER — Encounter: Payer: Self-pay | Admitting: Internal Medicine

## 2011-06-09 NOTE — Progress Notes (Signed)
Quick Note:  Diminutive adenomas. Recall not recommended given age. ______

## 2011-10-02 ENCOUNTER — Other Ambulatory Visit: Payer: Self-pay | Admitting: Cardiovascular Disease

## 2011-10-29 ENCOUNTER — Other Ambulatory Visit: Payer: Self-pay | Admitting: *Deleted

## 2011-10-29 ENCOUNTER — Other Ambulatory Visit (INDEPENDENT_AMBULATORY_CARE_PROVIDER_SITE_OTHER): Payer: Medicare Other

## 2011-10-29 DIAGNOSIS — E785 Hyperlipidemia, unspecified: Secondary | ICD-10-CM

## 2011-10-29 LAB — BASIC METABOLIC PANEL
BUN: 15 mg/dL (ref 6–23)
CO2: 27 mEq/L (ref 19–32)
Calcium: 9 mg/dL (ref 8.4–10.5)
Chloride: 100 mEq/L (ref 96–112)
Creatinine, Ser: 0.9 mg/dL (ref 0.4–1.5)
GFR: 84.27 mL/min (ref >60.00)
Glucose, Bld: 95 mg/dL (ref 70–99)
Potassium: 4.1 mEq/L (ref 3.5–5.1)
Sodium: 136 mEq/L (ref 135–145)

## 2011-10-29 LAB — HEPATIC FUNCTION PANEL
ALT: 20 U/L (ref 0–53)
AST: 23 U/L (ref 0–37)
Albumin: 4 g/dL (ref 3.5–5.2)
Alkaline Phosphatase: 44 U/L (ref 39–117)
Bilirubin, Direct: 0.2 mg/dL (ref 0.0–0.3)
Total Bilirubin: 1.1 mg/dL (ref 0.3–1.2)
Total Protein: 6.5 g/dL (ref 6.0–8.3)

## 2011-10-29 LAB — LIPID PANEL
Cholesterol: 151 mg/dL (ref 0–200)
HDL: 63.6 mg/dL (ref >39.00)
LDL Cholesterol: 75 mg/dL (ref 0–99)
Total CHOL/HDL Ratio: 2
Triglycerides: 61 mg/dL (ref 0.0–149.0)
VLDL: 12.2 mg/dL (ref 0.0–40.0)

## 2011-10-30 ENCOUNTER — Encounter: Payer: Self-pay | Admitting: Cardiovascular Disease

## 2011-10-30 ENCOUNTER — Ambulatory Visit (INDEPENDENT_AMBULATORY_CARE_PROVIDER_SITE_OTHER): Payer: Medicare Other | Admitting: Cardiovascular Disease

## 2011-10-30 VITALS — BP 137/63 | HR 47 | Ht 67.0 in | Wt 177.6 lb

## 2011-10-30 DIAGNOSIS — R609 Edema, unspecified: Secondary | ICD-10-CM

## 2011-10-30 DIAGNOSIS — I10 Essential (primary) hypertension: Secondary | ICD-10-CM

## 2011-10-30 DIAGNOSIS — R001 Bradycardia, unspecified: Secondary | ICD-10-CM

## 2011-10-30 DIAGNOSIS — I2510 Atherosclerotic heart disease of native coronary artery without angina pectoris: Secondary | ICD-10-CM

## 2011-10-30 DIAGNOSIS — I498 Other specified cardiac arrhythmias: Secondary | ICD-10-CM

## 2011-10-30 NOTE — Assessment & Plan Note (Signed)
His heart rate is still very slow. We will go had to stop the Zebeta. I'll see him again in 6 months for followup office visit. He will check his blood pressure at home and will call if it is elevated.

## 2011-10-30 NOTE — Progress Notes (Signed)
[name] [name] Date of Birth  04-30-1925       [facility]    [place] [address]  [address] [place], [place]  [address]   [place], [place]  [address] [phone]     [phone]   Fax  [phone]    Fax [phone]  Problem List: 1. Moderate coronary artery disease by heart catheterization 20 years ago 2. Hyperlipidemia 3. Hypertension  History of Present Illness:  [name] [name] is a 78 y.o. gentleman with the above noted hx.   He still has some ankle edema that he is concerned about.  He has not had any chest pain   Current Outpatient Prescriptions on File Prior to Visit  Medication Sig Dispense Refill  . amLODipine (NORVASC) 10 MG tablet TAKE ONE TABLET BY MOUTH EVERY DAY  90 tablet  0  . aspirin 325 MG tablet Take 325 mg by mouth daily. 1/2 DAILY       . atorvastatin (LIPITOR) 10 MG tablet Take 10 mg by mouth 2 (two) times a week.       . bisoprolol (ZEBETA) 5 MG tablet Take 5 mg by mouth daily.        . Cholecalciferol (VITAMIN D3) 1000 UNITS CAPS Take by mouth daily.        . Coenzyme Q10 (COQ10) 100 MG CAPS Take by mouth daily.        . finasteride (PROSCAR) 5 MG tablet Take 5 mg by mouth daily.        . fish oil-omega-3 fatty acids 1000 MG capsule Take by mouth daily.        Marland Kitchen lisinopril-hydrochlorothiazide (PRINZIDE,ZESTORETIC) 20-25 MG per tablet Take 1 tablet by mouth daily.        . Multiple Vitamin (MULTIVITAMIN) tablet Take 1 tablet by mouth daily.        . Multiple Vitamins-Minerals (ZINC PO) Take by mouth 3 (three) times a week.        Marland Kitchen NIACIN PO Take by mouth 4 (four) times a week. 5 days week       Current Facility-Administered Medications on File Prior to Visit  Medication Dose Route Frequency Provider Last Rate Last Dose  . 0.9 %  sodium chloride infusion  500 mL Intravenous Once Iva Boop, MD        No Known Allergies  Past Medical History  Diagnosis Date  . Coronary artery disease    MODERATE  . Hyperlipidemia   . Hypertension   . Headache   . Internal hemorrhoids 2009    Dr. Virginia Rochester Colonoscopy  . History of colon polyps 2009    Dr. Orr/Colonoscopy -small cecal adenoma  . H. pylori infection     Hx of     Past Surgical History  Procedure Date  . Cardiac catheterization 1997    REVEALED MILD TO MODERATE IRREGULARITIES. HIS LEFT EVNTRICULAR SYSTOLIC FUNCTION REVEALED NORMAL EJECTION FRACTION WITH EF OF 70%  . Appendectomy   . Inguinal hernia repair   . Tonsillectomy   . Hemorroidectomy   . Lower back surgery   . Colonoscopy multiple  . Upper gastrointestinal endoscopy     History  Smoking status  . Former Smoker  . Quit date: 10/25/1975  Smokeless tobacco  . Never Used    History  Alcohol Use  . 1.2 oz/week  . 1 Glasses of wine, 1 Cans of beer per week    occ    Family  History  Problem Relation Age of Onset  . Colon cancer Mother 75    second cancer in 70's deceased after emergency surgery  . Breast cancer Sister     Reviw of Systems:  Reviewed in the HPI.  All other systems are negative.  Physical Exam: Blood pressure 137/63, pulse 47, height 5\' 7"  (1.702 m), weight 177 lb 9.6 oz (80.559 kg). General: Well developed, well nourished, in no acute distress.  Head: Normocephalic, atraumatic, sclera non-icteric, mucus membranes are moist,   Neck: Supple. Carotids are 2 + without bruits. No JVD  Lungs: Clear bilaterally to auscultation.  Heart: regular rate.  normal  S1 S2. No murmurs, gallops or rubs.  Abdomen: Soft, non-tender, non-distended with normal bowel sounds. No hepatomegaly. No rebound/guarding. No masses.  Msk:  Strength and tone are normal  Extremities: No clubbing or cyanosis. Trace - 1+ edema.  Distal pedal pulses are 2+ and equal bilaterally.  Neuro: Alert and oriented X 3. Moves all extremities spontaneously.  Psych:  Responds to questions appropriately with a normal affect.  ECG: Oct 30, 2011-marked sinus  bradycardia 48 beats a minute. EKG is otherwise normal.  Assessment / Plan:

## 2011-10-30 NOTE — Assessment & Plan Note (Addendum)
His lipids are well controlled. He's not having any angina.  Addendum:  Nov 01, 2011 Echo results Left ventricle: The cavity size was normal. Wall thickness was increased in a pattern of mild LVH. Systolic function was normal. The estimated ejection fraction was in the range of 60% to 65%. Wall motion was normal; there were no regional wall motion abnormalities. Doppler parameters are consistent with abnormal left ventricular relaxation (grade 1 diastolic dysfunction). - Aortic valve: Mild regurgitation.

## 2011-10-30 NOTE — Patient Instructions (Addendum)
Your physician wants you to follow-up in: 6 MONTHS  You will receive a reminder letter in the mail two months in advance. If you don't receive a letter, please call our office to schedule the follow-up appointment.  Your physician recommends that you return for a FASTING lipid profile: 6 MONTHS  Your physician has recommended you make the following change in your medication: STOP ZEBATA DUE TO SLOW HEART RATE/ CHECK BP DAILY CALL WITH QUESTIONS OR CONCERNS.  Your physician has requested that you have an echocardiogram. Echocardiography is a painless test that uses sound waves to create images of your heart. It provides your doctor with information about the size and shape of your heart and how well your heart's chambers and valves are working. This procedure takes approximately one hour. There are no restrictions for this procedure.

## 2011-10-30 NOTE — Assessment & Plan Note (Addendum)
He's on Zestoretic and amlodipine. He's having some ankle edema which may be due to the amlodipine. His neighbor started him on some Lasix which seems to have helped.  We'll get an echocardiogram for further evaluation of his cardiac function. We will also want to evaluate his ankle edema.  We'll continue to follow him. The may need to increase the lisinopril his blood pressure becomes elevated after stopping the bisoprolol.

## 2011-10-31 ENCOUNTER — Other Ambulatory Visit: Payer: Self-pay

## 2011-10-31 ENCOUNTER — Ambulatory Visit (HOSPITAL_COMMUNITY): Payer: Medicare Other | Attending: Internal Medicine

## 2011-10-31 DIAGNOSIS — R609 Edema, unspecified: Secondary | ICD-10-CM

## 2011-10-31 DIAGNOSIS — I495 Sick sinus syndrome: Secondary | ICD-10-CM | POA: Insufficient documentation

## 2011-10-31 DIAGNOSIS — I2510 Atherosclerotic heart disease of native coronary artery without angina pectoris: Secondary | ICD-10-CM

## 2011-10-31 DIAGNOSIS — M7989 Other specified soft tissue disorders: Secondary | ICD-10-CM | POA: Insufficient documentation

## 2011-10-31 DIAGNOSIS — R001 Bradycardia, unspecified: Secondary | ICD-10-CM

## 2011-10-31 DIAGNOSIS — I379 Nonrheumatic pulmonary valve disorder, unspecified: Secondary | ICD-10-CM | POA: Insufficient documentation

## 2011-10-31 DIAGNOSIS — I359 Nonrheumatic aortic valve disorder, unspecified: Secondary | ICD-10-CM | POA: Insufficient documentation

## 2011-10-31 DIAGNOSIS — I10 Essential (primary) hypertension: Secondary | ICD-10-CM | POA: Insufficient documentation

## 2011-10-31 DIAGNOSIS — E785 Hyperlipidemia, unspecified: Secondary | ICD-10-CM | POA: Insufficient documentation

## 2011-10-31 DIAGNOSIS — I079 Rheumatic tricuspid valve disease, unspecified: Secondary | ICD-10-CM | POA: Insufficient documentation

## 2011-11-03 ENCOUNTER — Telehealth: Payer: Self-pay | Admitting: Cardiovascular Disease

## 2011-11-03 NOTE — Telephone Encounter (Signed)
Message copied by Antony Odea on Mon Nov 03, 2011  5:58 PM ------      Message from: Vesta Mixer      Created: Sat Nov 01, 2011  7:30 AM       Normal LV function      Mild valvular abnormalities that we will follow . No medication adjustments needed.

## 2011-11-03 NOTE — Telephone Encounter (Signed)
msg left to call back

## 2011-11-03 NOTE — Telephone Encounter (Signed)
Patient called with lab/ echo results. Pt verbalized understanding. Alfonso Ramus RN

## 2011-11-03 NOTE — Telephone Encounter (Signed)
New msg Pt wants to talk to you about what is on his records. Please call him back

## 2012-01-09 ENCOUNTER — Other Ambulatory Visit: Payer: Self-pay | Admitting: Cardiovascular Disease

## 2012-04-14 ENCOUNTER — Other Ambulatory Visit: Payer: Self-pay | Admitting: Cardiovascular Disease

## 2012-04-14 NOTE — Telephone Encounter (Signed)
Fax Received. Refill Completed. [name] [name] (R.M.A)   

## 2012-05-27 ENCOUNTER — Other Ambulatory Visit: Payer: Self-pay | Admitting: *Deleted

## 2012-05-27 DIAGNOSIS — I10 Essential (primary) hypertension: Secondary | ICD-10-CM

## 2012-05-27 DIAGNOSIS — I2510 Atherosclerotic heart disease of native coronary artery without angina pectoris: Secondary | ICD-10-CM

## 2012-05-27 DIAGNOSIS — E785 Hyperlipidemia, unspecified: Secondary | ICD-10-CM

## 2012-05-31 ENCOUNTER — Other Ambulatory Visit (INDEPENDENT_AMBULATORY_CARE_PROVIDER_SITE_OTHER): Payer: Medicare Other

## 2012-05-31 DIAGNOSIS — I10 Essential (primary) hypertension: Secondary | ICD-10-CM

## 2012-05-31 DIAGNOSIS — E785 Hyperlipidemia, unspecified: Secondary | ICD-10-CM

## 2012-05-31 DIAGNOSIS — I2510 Atherosclerotic heart disease of native coronary artery without angina pectoris: Secondary | ICD-10-CM

## 2012-05-31 LAB — LIPID PANEL
Cholesterol: 166 mg/dL (ref 0–200)
HDL: 69.8 mg/dL (ref >39.00)
LDL Cholesterol: 84 mg/dL (ref 0–99)
Total CHOL/HDL Ratio: 2
Triglycerides: 59 mg/dL (ref 0.0–149.0)
VLDL: 11.8 mg/dL (ref 0.0–40.0)

## 2012-05-31 LAB — HEPATIC FUNCTION PANEL
ALT: 26 U/L (ref 0–53)
AST: 25 U/L (ref 0–37)
Albumin: 4.1 g/dL (ref 3.5–5.2)
Alkaline Phosphatase: 49 U/L (ref 39–117)
Bilirubin, Direct: 0.1 mg/dL (ref 0.0–0.3)
Total Bilirubin: 1.2 mg/dL (ref 0.3–1.2)
Total Protein: 6.9 g/dL (ref 6.0–8.3)

## 2012-05-31 LAB — BASIC METABOLIC PANEL
BUN: 19 mg/dL (ref 6–23)
CO2: 29 mEq/L (ref 19–32)
Calcium: 9.1 mg/dL (ref 8.4–10.5)
Chloride: 99 mEq/L (ref 96–112)
Creatinine, Ser: 0.9 mg/dL (ref 0.4–1.5)
GFR: 88.63 mL/min (ref >60.00)
Glucose, Bld: 95 mg/dL (ref 70–99)
Potassium: 3.6 mEq/L (ref 3.5–5.1)
Sodium: 137 mEq/L (ref 135–145)

## 2012-06-02 ENCOUNTER — Encounter: Payer: Self-pay | Admitting: Cardiovascular Disease

## 2012-06-02 ENCOUNTER — Ambulatory Visit (INDEPENDENT_AMBULATORY_CARE_PROVIDER_SITE_OTHER): Payer: Medicare Other | Admitting: Cardiovascular Disease

## 2012-06-02 VITALS — BP 134/60 | HR 60 | Ht 67.0 in | Wt 175.0 lb

## 2012-06-02 DIAGNOSIS — I10 Essential (primary) hypertension: Secondary | ICD-10-CM

## 2012-06-02 DIAGNOSIS — E785 Hyperlipidemia, unspecified: Secondary | ICD-10-CM

## 2012-06-02 DIAGNOSIS — I2510 Atherosclerotic heart disease of native coronary artery without angina pectoris: Secondary | ICD-10-CM

## 2012-06-02 NOTE — Progress Notes (Signed)
[name] [name] Date of Birth  [birth year]       [facility]    [place] [address]  [address] [place], [place]  [address]   [place], [place]  [address] [phone]     [phone]   Fax  [phone]    Fax [phone]  Problem List: 1. Moderate coronary artery disease by heart catheterization 20 years ago 2. Hyperlipidemia 3. Hypertension 4. Mild AI 5, Mild TR  History of Present Illness:  [name] [name] is a 78 y.o. gentleman with the above noted hx.   He still has some ankle edema that he is concerned about.  He has not had any chest pain.   He is still very active and healthy.  Current Outpatient Prescriptions on File Prior to Visit  Medication Sig Dispense Refill  . amLODipine (NORVASC) 10 MG tablet TAKE ONE TABLET BY MOUTH EVERY DAY-NEEDS TO SCHEDULE AN OFFICE VISIT  90 tablet  3  . aspirin 325 MG tablet Take 325 mg by mouth daily. 1/2 DAILY       . Cholecalciferol (VITAMIN D3) 1000 UNITS CAPS Take by mouth daily.        . Coenzyme Q10 (COQ10) 100 MG CAPS Take by mouth daily.        . finasteride (PROSCAR) 5 MG tablet Take 5 mg by mouth daily.        . fish oil-omega-3 fatty acids 1000 MG capsule Take by mouth daily.        . furosemide (LASIX) 20 MG tablet Take 20 mg by mouth 2 (two) times daily.      Marland Kitchen lisinopril-hydrochlorothiazide (PRINZIDE,ZESTORETIC) 20-25 MG per tablet Take 1 tablet by mouth daily.        . Multiple Vitamin (MULTIVITAMIN) tablet Take 1 tablet by mouth daily.        . Multiple Vitamins-Minerals (ZINC PO) Take by mouth 3 (three) times a week.        Marland Kitchen NIACIN PO Take by mouth. 5 days week        No Known Allergies  Past Medical History  Diagnosis Date  . Coronary artery disease     MODERATE  . Hyperlipidemia   . Hypertension   . Headache   . Internal hemorrhoids 2009    Dr. Virginia [name] Colonoscopy  . History of colon polyps 2009    Dr. Orr/Colonoscopy -small cecal adenoma  . H. pylori  infection     Hx of     Past Surgical History  Procedure Date  . Cardiac catheterization 1997    REVEALED MILD TO MODERATE IRREGULARITIES. HIS LEFT EVNTRICULAR SYSTOLIC FUNCTION REVEALED NORMAL EJECTION FRACTION WITH EF OF 70%  . Appendectomy   . Inguinal hernia repair   . Tonsillectomy   . Hemorroidectomy   . Lower back surgery   . Colonoscopy multiple  . Upper gastrointestinal endoscopy     History  Smoking status  . Former Smoker  . Quit date: 10/25/1975  Smokeless tobacco  . Never Used    History  Alcohol Use  . 1.2 oz/week  . 1 Glasses of wine, 1 Cans of beer per week    Comment: occ    Family History  Problem Relation Age of Onset  . Colon cancer Mother 51    second cancer in 70's deceased after emergency surgery  . Breast cancer Sister     Reviw of Systems:  Reviewed in the HPI.  All other systems  are negative.  Physical Exam: Blood pressure 134/60, pulse 60, height 5\' 7"  (1.702 m), weight 175 lb 12.8 oz (79.742 kg), SpO2 98.00%. General: Well developed, well nourished, in no acute distress.  Head: Normocephalic, atraumatic, sclera non-icteric, mucus membranes are moist,   Neck: Supple. Carotids are 2 + without bruits. No JVD  Lungs: Clear bilaterally to auscultation.  Heart: regular rate.  normal  S1 S2.  Very faint systolic murmur  Abdomen: Soft, non-tender, non-distended with normal bowel sounds. No hepatomegaly. No rebound/guarding. No masses.  Msk:  Strength and tone are normal  Extremities: No clubbing or cyanosis. Trace - 1+ edema.  Distal pedal pulses are 2+ and equal bilaterally.  Neuro: Alert and oriented X 3. Moves all extremities spontaneously.  Psych:  Responds to questions appropriately with a normal affect.  ECG:  Assessment / Plan:

## 2012-06-02 NOTE — Assessment & Plan Note (Signed)
His blood pressure is well-controlled. Continue with his current medications.

## 2012-06-02 NOTE — Assessment & Plan Note (Signed)
His recent lipid levels are very good. We will continue with his current dose of atorvastatin.

## 2012-06-02 NOTE — Patient Instructions (Addendum)
Your physician wants you to follow-up in: 1 year with fasting labs and an ekg at that time.  You will receive a reminder letter in the mail two months in advance. If you don't receive a letter, please call our office to schedule the follow-up appointment.  Your physician recommends that you return for lab work in: at your next appt in one year.

## 2012-06-02 NOTE — Assessment & Plan Note (Signed)
He has moderate coronary artery disease by cardiac apposition. He's completely asymptomatic he denies any chest pain or shortness breath. He remains very active.

## 2013-01-17 ENCOUNTER — Other Ambulatory Visit: Payer: Self-pay | Admitting: Cardiovascular Disease

## 2013-06-07 ENCOUNTER — Encounter: Payer: Self-pay | Admitting: Cardiovascular Disease

## 2013-06-07 ENCOUNTER — Ambulatory Visit (INDEPENDENT_AMBULATORY_CARE_PROVIDER_SITE_OTHER): Payer: Medicare Other | Admitting: Cardiovascular Disease

## 2013-06-07 VITALS — BP 133/69 | HR 65 | Ht 68.0 in | Wt 173.8 lb

## 2013-06-07 DIAGNOSIS — E785 Hyperlipidemia, unspecified: Secondary | ICD-10-CM

## 2013-06-07 DIAGNOSIS — I2510 Atherosclerotic heart disease of native coronary artery without angina pectoris: Secondary | ICD-10-CM

## 2013-06-07 DIAGNOSIS — R001 Bradycardia, unspecified: Secondary | ICD-10-CM

## 2013-06-07 DIAGNOSIS — I495 Sick sinus syndrome: Secondary | ICD-10-CM

## 2013-06-07 NOTE — Assessment & Plan Note (Signed)
Mr. Kelsay is doing well. He's not had any episodes of chest pain. Continue with the same medications.

## 2013-06-07 NOTE — Progress Notes (Signed)
[name] [name] Date of Birth  [birth year]       [facility]    [place] [address]  [address], [address] [place], [place]  [address]   [place], [place]  [address] [phone]     [phone]   Fax  [phone]    Fax [phone]  Problem List: 1. Moderate coronary artery disease by heart catheterization 20 years ago 2. Hyperlipidemia 3. Hypertension 4. Mild AI 5, Mild TR  History of Present Illness:  [name] [name] is a 79 y.o. gentleman with the above noted hx.   He still has some ankle edema that he is concerned about.  He has not had any chest pain.   He is still very active and healthy.  Dec. 23, 2014:  [name] [name] has done well.  No CP.  No dyspnea or syncope.   Still moderately active.  He walks some.  He does some stretching.  He wants to start bowling again.    He does have some  symptoms when he first lies down or sits up.  ? Vertigo vs. orthostasis hyotension.   Current Outpatient Prescriptions on File Prior to Visit  Medication Sig Dispense Refill  . amLODipine (NORVASC) 10 MG tablet TAKE ONE TABLET BY MOUTH EVERY DAY  90 tablet  1  . aspirin 325 MG tablet Take 325 mg by mouth daily. 1/2 DAILY       . atorvastatin (LIPITOR) 20 MG tablet Take 20 mg by mouth. 2 Times a Week      . Cholecalciferol (VITAMIN D3) 1000 UNITS CAPS Take by mouth daily.        . Coenzyme Q10 (COQ10) 100 MG CAPS Take by mouth daily.        . fish oil-omega-3 fatty acids 1000 MG capsule Take by mouth daily.        . furosemide (LASIX) 20 MG tablet Take 20 mg by mouth as needed.       Marland Kitchen lisinopril-hydrochlorothiazide (PRINZIDE,ZESTORETIC) 20-25 MG per tablet Take 1 tablet by mouth daily.        . Multiple Vitamin (MULTIVITAMIN) tablet Take 1 tablet by mouth daily.        . Multiple Vitamins-Minerals (ZINC PO) Take by mouth 3 (three) times a week.        Marland Kitchen NIACIN PO Take by mouth. 5 days week      . finasteride (PROSCAR) 5 MG tablet Take 5 mg by  mouth daily.         No current facility-administered medications on file prior to visit.    No Known Allergies  Past Medical History  Diagnosis Date  . Coronary artery disease     MODERATE  . Hyperlipidemia   . Hypertension   . Headache(784.0)   . Internal hemorrhoids 2009    Dr. Virginia Rochester Colonoscopy  . History of colon polyps 2009    Dr. Orr/Colonoscopy -small cecal adenoma  . H. pylori infection     Hx of     Past Surgical History  Procedure Laterality Date  . Cardiac catheterization  1997    REVEALED MILD TO MODERATE IRREGULARITIES. HIS LEFT EVNTRICULAR SYSTOLIC FUNCTION REVEALED NORMAL EJECTION FRACTION WITH EF OF 70%  . Appendectomy    . Inguinal hernia repair    . Tonsillectomy    . Hemorroidectomy    . Lower back surgery    . Colonoscopy  multiple  . Upper gastrointestinal endoscopy  History  Smoking status  . Former Smoker  . Quit date: 10/25/1975  Smokeless tobacco  . Never Used    History  Alcohol Use  . 1.2 oz/week  . 1 Glasses of wine, 1 Cans of beer per week    Comment: occ    Family History  Problem Relation Age of Onset  . Colon cancer Mother 29    second cancer in 51's deceased after emergency surgery  . Breast cancer Sister     Reviw of Systems:  Reviewed in the HPI.  All other systems are negative.  Physical Exam: Blood pressure 133/69, pulse 65, height 5\' 8"  (1.727 m), weight 173 lb 12.8 oz (78.835 kg). General: Well developed, well nourished, in no acute distress.  Head: Normocephalic, atraumatic, sclera non-icteric, mucus membranes are moist,   Neck: Supple. Carotids are 2 + without bruits. No JVD  Lungs: Clear bilaterally to auscultation.  Heart: regular rate.  normal  S1 S2.  Very faint systolic murmur  Abdomen: Soft, non-tender, non-distended with normal bowel sounds. No hepatomegaly. No rebound/guarding. No masses.  Msk:  Strength and tone are normal  Extremities: No clubbing or cyanosis. Trace - 1+ edema.  Distal  pedal pulses are 2+ and equal bilaterally.  Neuro: Alert and oriented X 3. Moves all extremities spontaneously.  Psych:  Responds to questions appropriately with a normal affect.  ECG:  Dec. 23, 2014:  Sinus brady at 61.  Normal ECG.    Assessment / Plan:

## 2013-06-07 NOTE — Assessment & Plan Note (Signed)
He has his lipid levels drawn at his medical doctors office. I have requested that he have been sent   here.

## 2013-06-07 NOTE — Patient Instructions (Signed)
Your physician wants you to follow-up in: 1 year with ekg You will receive a reminder letter in the mail two months in advance. If you don't receive a letter, please call our office to schedule the follow-up appointment.  Your physician recommends that you continue on your current medications as directed. Please refer to the Current Medication list given to you today. 

## 2013-08-04 ENCOUNTER — Other Ambulatory Visit: Payer: Self-pay | Admitting: Cardiovascular Disease

## 2013-08-16 ENCOUNTER — Encounter (HOSPITAL_COMMUNITY): Payer: Self-pay | Admitting: Emergency Medicine

## 2013-08-16 ENCOUNTER — Emergency Department (HOSPITAL_COMMUNITY): Payer: Medicare Other

## 2013-08-16 ENCOUNTER — Inpatient Hospital Stay (HOSPITAL_COMMUNITY)
Admission: EM | Admit: 2013-08-16 | Discharge: 2013-08-20 | DRG: 470 | Disposition: A | Discharge status: Skilled Nursing Facility | Payer: Medicare Other | Attending: Internal Medicine | Admitting: Internal Medicine

## 2013-08-16 DIAGNOSIS — W010XXA Fall on same level from slipping, tripping and stumbling without subsequent striking against object, initial encounter: Secondary | ICD-10-CM | POA: Diagnosis present

## 2013-08-16 DIAGNOSIS — Z79899 Other long term (current) drug therapy: Secondary | ICD-10-CM

## 2013-08-16 DIAGNOSIS — S7292XA Unspecified fracture of left femur, initial encounter for closed fracture: Secondary | ICD-10-CM | POA: Diagnosis present

## 2013-08-16 DIAGNOSIS — I5032 Chronic diastolic (congestive) heart failure: Secondary | ICD-10-CM | POA: Diagnosis present

## 2013-08-16 DIAGNOSIS — E785 Hyperlipidemia, unspecified: Secondary | ICD-10-CM | POA: Diagnosis present

## 2013-08-16 DIAGNOSIS — Z87891 Personal history of nicotine dependence: Secondary | ICD-10-CM

## 2013-08-16 DIAGNOSIS — S72033A Displaced midcervical fracture of unspecified femur, initial encounter for closed fracture: Principal | ICD-10-CM | POA: Diagnosis present

## 2013-08-16 DIAGNOSIS — F0390 Unspecified dementia without behavioral disturbance: Secondary | ICD-10-CM | POA: Diagnosis present

## 2013-08-16 DIAGNOSIS — Z7982 Long term (current) use of aspirin: Secondary | ICD-10-CM

## 2013-08-16 DIAGNOSIS — I498 Other specified cardiac arrhythmias: Secondary | ICD-10-CM | POA: Diagnosis present

## 2013-08-16 DIAGNOSIS — Z800 Family history of malignant neoplasm of digestive organs: Secondary | ICD-10-CM

## 2013-08-16 DIAGNOSIS — R001 Bradycardia, unspecified: Secondary | ICD-10-CM

## 2013-08-16 DIAGNOSIS — I509 Heart failure, unspecified: Secondary | ICD-10-CM | POA: Diagnosis present

## 2013-08-16 DIAGNOSIS — I10 Essential (primary) hypertension: Secondary | ICD-10-CM | POA: Diagnosis present

## 2013-08-16 DIAGNOSIS — R51 Headache: Secondary | ICD-10-CM

## 2013-08-16 DIAGNOSIS — S72009A Fracture of unspecified part of neck of unspecified femur, initial encounter for closed fracture: Secondary | ICD-10-CM

## 2013-08-16 DIAGNOSIS — I2510 Atherosclerotic heart disease of native coronary artery without angina pectoris: Secondary | ICD-10-CM | POA: Diagnosis present

## 2013-08-16 DIAGNOSIS — Z86010 Personal history of colonic polyps: Secondary | ICD-10-CM

## 2013-08-16 DIAGNOSIS — N400 Enlarged prostate without lower urinary tract symptoms: Secondary | ICD-10-CM | POA: Diagnosis present

## 2013-08-16 LAB — CBC WITH DIFFERENTIAL/PLATELET
Basophils Absolute: 0 10*3/uL (ref 0.0–0.1)
Basophils Relative: 0 % (ref 0–1)
Eosinophils Absolute: 0.2 10*3/uL (ref 0.0–0.7)
Eosinophils Relative: 2 % (ref 0–5)
HCT: 42.5 % (ref 39.0–52.0)
Hemoglobin: 15.1 g/dL (ref 13.0–17.0)
Lymphocytes Relative: 19 % (ref 12–46)
Lymphs Abs: 1.6 10*3/uL (ref 0.7–4.0)
MCH: 32.4 pg (ref 26.0–34.0)
MCHC: 35.5 g/dL (ref 30.0–36.0)
MCV: 91.2 fL (ref 78.0–100.0)
Monocytes Absolute: 1.2 10*3/uL — ABNORMAL HIGH (ref 0.1–1.0)
Monocytes Relative: 14 % — ABNORMAL HIGH (ref 3–12)
Neutro Abs: 5.6 10*3/uL (ref 1.7–7.7)
Neutrophils Relative %: 65 % (ref 43–77)
Platelets: 214 10*3/uL (ref 150–400)
RBC: 4.66 MIL/uL (ref 4.22–5.81)
RDW: 11.9 % (ref 11.5–15.5)
WBC: 8.6 10*3/uL (ref 4.0–10.5)

## 2013-08-16 LAB — BASIC METABOLIC PANEL
BUN: 26 mg/dL — ABNORMAL HIGH (ref 6–23)
CO2: 26 mEq/L (ref 19–32)
Calcium: 10 mg/dL (ref 8.4–10.5)
Chloride: 98 mEq/L (ref 96–112)
Creatinine, Ser: 1.18 mg/dL (ref 0.50–1.35)
GFR calc Af Amer: 63 mL/min — ABNORMAL LOW (ref >90)
GFR calc non Af Amer: 54 mL/min — ABNORMAL LOW (ref >90)
Glucose, Bld: 105 mg/dL — ABNORMAL HIGH (ref 70–99)
Potassium: 3.7 mEq/L (ref 3.7–5.3)
Sodium: 139 mEq/L (ref 137–147)

## 2013-08-16 LAB — TYPE AND SCREEN
ABO/RH(D): B NEG
Antibody Screen: NEGATIVE

## 2013-08-16 LAB — PROTIME-INR
INR: 1.07 (ref 0.00–1.49)
Prothrombin Time: 13.7 seconds (ref 11.6–15.2)

## 2013-08-16 MED ORDER — MORPHINE SULFATE 4 MG/ML IJ SOLN
4.0000 mg | INTRAMUSCULAR | Status: AC | PRN
  Administered 2013-08-16 – 2013-08-17 (×3): 4 mg via INTRAVENOUS
  Filled 2013-08-16 (×3): qty 1

## 2013-08-16 MED ORDER — ONDANSETRON HCL 4 MG/2ML IJ SOLN
4.0000 mg | Freq: Once | INTRAMUSCULAR | Status: AC
  Administered 2013-08-16: 4 mg via INTRAVENOUS
  Filled 2013-08-16: qty 2

## 2013-08-16 NOTE — ED Notes (Signed)
Pt aware of need to provide a urine sample but is unable to provide one at this time.

## 2013-08-16 NOTE — ED Notes (Signed)
Attempted to get urine from pt, he stated that he doesn't have to go. Will try again

## 2013-08-16 NOTE — ED Notes (Signed)
Pt reports on 2/20 he fell and slid down into a ditch after slipping on the ice, pt states Dr. Drema Dallas at the orthopedics office told him to come here because his MRI showed that he has broken his L hip. Pt has been walking on this leg with difficulty until today. Reports that it has become more difficult. Pt in a wheelchair at this time. Pt a&o x4.

## 2013-08-16 NOTE — ED Provider Notes (Signed)
CSN: [id number]     Arrival date & time 08/16/13  1918 History   First MD Initiated Contact with Patient 08/16/13 2006     Chief Complaint  Patient presents with  . Hip Injury      HPI  Patient presents with a left hip fracture. About 10 days ago he fell on the ice in a culvert in his front yard. With some effort he was able to get back into his home. He has been able to function somewhat in his home with pain. Had x-rays a days ago that apparently did not show his fracture. He had sudden worsening of his pain today while trying to start a generator. He went and saw his orthopedist at [place] and had an MRI that demonstrated a left subcapital hip fracture and he was directed here.  Past Medical History  Diagnosis Date  . Coronary artery disease     MODERATE  . Hyperlipidemia   . Hypertension   . Headache(784.0)   . Internal hemorrhoids 2009    Dr. Lajoyce Corners Colonoscopy  . History of colon polyps 2009    Dr. Orr/Colonoscopy -small cecal adenoma  . H. pylori infection     Hx of    Past Surgical History  Procedure Laterality Date  . Cardiac catheterization  1997    REVEALED MILD TO MODERATE IRREGULARITIES. HIS LEFT EVNTRICULAR SYSTOLIC FUNCTION REVEALED NORMAL EJECTION FRACTION WITH EF OF 70%  . Appendectomy    . Inguinal hernia repair    . Tonsillectomy    . Hemorroidectomy    . Lower back surgery    . Colonoscopy  multiple  . Upper gastrointestinal endoscopy     Family History  Problem Relation Age of Onset  . Colon cancer Mother 21    second cancer in 12's deceased after emergency surgery  . Breast cancer Sister    History  Substance Use Topics  . Smoking status: Former Smoker    Quit date: 10/25/1975  . Smokeless tobacco: Never Used  . Alcohol Use: 1.2 oz/week    1 Glasses of wine, 1 Cans of beer per week     Comment: occ    Review of Systems  Constitutional: Negative for fever, chills, diaphoresis, appetite change and fatigue.  HENT: Negative for mouth  sores, sore throat and trouble swallowing.   Eyes: Negative for visual disturbance.  Respiratory: Negative for cough, chest tightness, shortness of breath and wheezing.   Cardiovascular: Negative for chest pain.  Gastrointestinal: Negative for nausea, vomiting, abdominal pain, diarrhea and abdominal distention.  Endocrine: Negative for polydipsia, polyphagia and polyuria.  Genitourinary: Negative for dysuria, frequency and hematuria.  Musculoskeletal: Negative for gait problem.       Left hip pain  Skin: Negative for color change, pallor and rash.  Neurological: Negative for dizziness, syncope, light-headedness and headaches.  Hematological: Does not bruise/bleed easily.  Psychiatric/Behavioral: Negative for behavioral problems and confusion.      Allergies  Review of patient's allergies indicates no known allergies.  Home Medications   Current Outpatient Rx  Name  Route  Sig  Dispense  Refill  . amLODipine (NORVASC) 10 MG tablet        90 tablet   0   . aspirin 325 MG tablet   Oral   Take 325 mg by mouth daily. 1/2 DAILY          . atorvastatin (LIPITOR) 20 MG tablet   Oral   Take 20 mg by mouth. 2 Times a  Week         . Cholecalciferol (VITAMIN D3) 1000 UNITS CAPS   Oral   Take by mouth daily.           . Coenzyme Q10 (COQ10) 100 MG CAPS   Oral   Take by mouth daily.           . finasteride (PROSCAR) 5 MG tablet   Oral   Take 5 mg by mouth daily.           . furosemide (LASIX) 20 MG tablet   Oral   Take 40 mg by mouth as needed for fluid.          Marland Kitchen KRILL OIL PO   Oral   Take 1 capsule by mouth daily.         Marland Kitchen lisinopril-hydrochlorothiazide (PRINZIDE,ZESTORETIC) 20-25 MG per tablet   Oral   Take 1 tablet by mouth daily.           . Multiple Vitamin (MULTIVITAMIN) tablet   Oral   Take 1 tablet by mouth daily.           . Multiple Vitamins-Minerals (ZINC PO)   Oral   Take by mouth 3 (three) times a week. Monday, Wednesday, and  Friday.         Marland Kitchen NIACIN PO   Oral   Take by mouth. 5 days week         . traMADol (ULTRAM) 50 MG tablet   Oral   Take 50 mg by mouth every 6 (six) hours as needed.          BP 169/71  Pulse 64  Temp(Src) 97.6 F (36.4 C) (Oral)  Resp 18  SpO2 98% Physical Exam  Constitutional: He is oriented to person, place, and time. He appears well-developed and well-nourished. No distress.  HENT:  Head: Normocephalic.  Eyes: Conjunctivae are normal. Pupils are equal, round, and reactive to light. No scleral icterus.  Neck: Normal range of motion. Neck supple. No thyromegaly present.  Cardiovascular: Normal rate and regular rhythm.  Exam reveals no gallop and no friction rub.   No murmur heard. Pulmonary/Chest: Effort normal and breath sounds normal. No respiratory distress. He has no wheezes. He has no rales.  Abdominal: Soft. Bowel sounds are normal. He exhibits no distension. There is no tenderness. There is no rebound.  Musculoskeletal: Normal range of motion.  Patient has pain with any movement of his left hip. Patient has ecchymosis posterior to the left hip.  Neurological: He is alert and oriented to person, place, and time.  Skin: Skin is warm and dry. No rash noted.  Psychiatric: He has a normal mood and affect. His behavior is normal.    ED Course  Procedures (including critical care time) Labs Review Labs Reviewed  BASIC METABOLIC PANEL - Abnormal; Notable for the following:    Glucose, Bld 105 (*)    BUN 26 (*)    GFR calc non Af Amer 54 (*)    GFR calc Af Amer 63 (*)    All other components within normal limits  CBC WITH DIFFERENTIAL - Abnormal; Notable for the following:    Monocytes Relative 14 (*)    Monocytes Absolute 1.2 (*)    All other components within normal limits  URINE CULTURE  PROTIME-INR  URINALYSIS, ROUTINE W REFLEX MICROSCOPIC  TYPE AND SCREEN  ABO/RH   Imaging Review Dg Chest 1 View  08/16/2013   CLINICAL DATA:  Hip injury.  EXAM:  CHEST -  1 VIEW  COMPARISON:  03/22/2009  FINDINGS: Normal heart size and mediastinal contours. No acute infiltrate or edema. No effusion or pneumothorax. No acute osseous findings.  IMPRESSION: No active disease.   Electronically Signed   By: Jorje Guild M.D.   On: 08/16/2013 21:16   Dg Hip Complete Left  08/16/2013   CLINICAL DATA:  Fall, left hip pain  EXAM: LEFT HIP - COMPLETE 2+ VIEW  COMPARISON:  None.  FINDINGS: There is an acute left hip subcapital femoral neck fracture with slight displacement. Diffuse soft tissue swelling. Bones are osteopenic. Pelvis and right hip appear intact. No diastases.  IMPRESSION: Acute left hip subcapital femoral neck fracture   Electronically Signed   By: Daryll Brod M.D.   On: 08/16/2013 21:18     EKG Interpretation None      MDM   Final diagnoses:  Hip fracture    As this is Dr. Posey Pronto. Also talked with hospitalist. Patient admitted and hip fracture orders.    Tanna Furry, MD 08/16/13 [phone]

## 2013-08-17 ENCOUNTER — Encounter (HOSPITAL_COMMUNITY): Payer: Medicare Other | Admitting: Anesthesiology

## 2013-08-17 ENCOUNTER — Encounter (HOSPITAL_COMMUNITY)
Admission: EM | Disposition: A | Discharge status: Skilled Nursing Facility | Payer: Self-pay | Source: Home / Self Care | Attending: Internal Medicine

## 2013-08-17 ENCOUNTER — Inpatient Hospital Stay (HOSPITAL_COMMUNITY): Payer: Medicare Other

## 2013-08-17 ENCOUNTER — Inpatient Hospital Stay (HOSPITAL_COMMUNITY): Payer: Medicare Other | Admitting: Anesthesiology

## 2013-08-17 DIAGNOSIS — I2510 Atherosclerotic heart disease of native coronary artery without angina pectoris: Secondary | ICD-10-CM

## 2013-08-17 DIAGNOSIS — S72009A Fracture of unspecified part of neck of unspecified femur, initial encounter for closed fracture: Secondary | ICD-10-CM

## 2013-08-17 DIAGNOSIS — I5032 Chronic diastolic (congestive) heart failure: Secondary | ICD-10-CM | POA: Diagnosis not present

## 2013-08-17 DIAGNOSIS — E785 Hyperlipidemia, unspecified: Secondary | ICD-10-CM

## 2013-08-17 DIAGNOSIS — I10 Essential (primary) hypertension: Secondary | ICD-10-CM

## 2013-08-17 DIAGNOSIS — S7290XA Unspecified fracture of unspecified femur, initial encounter for closed fracture: Secondary | ICD-10-CM

## 2013-08-17 HISTORY — PX: HIP ARTHROPLASTY: SHX981

## 2013-08-17 LAB — URINALYSIS, ROUTINE W REFLEX MICROSCOPIC
Bilirubin Urine: NEGATIVE
Glucose, UA: NEGATIVE mg/dL
Hgb urine dipstick: NEGATIVE
Ketones, ur: NEGATIVE mg/dL
Leukocytes, UA: NEGATIVE
Nitrite: NEGATIVE
Protein, ur: NEGATIVE mg/dL
Specific Gravity, Urine: 1.021 (ref 1.005–1.030)
Urobilinogen, UA: 0.2 mg/dL (ref 0.0–1.0)
pH: 6.5 (ref 5.0–8.0)

## 2013-08-17 LAB — SURGICAL PCR SCREEN
MRSA, PCR: NEGATIVE
Staphylococcus aureus: NEGATIVE

## 2013-08-17 LAB — ABO/RH: ABO/RH(D): B NEG

## 2013-08-17 SURGERY — HEMIARTHROPLASTY, HIP, DIRECT ANTERIOR APPROACH, FOR FRACTURE
Anesthesia: General | Laterality: Left

## 2013-08-17 MED ORDER — FENTANYL CITRATE 0.05 MG/ML IJ SOLN
INTRAMUSCULAR | Status: DC | PRN
  Administered 2013-08-17: 50 ug via INTRAVENOUS

## 2013-08-17 MED ORDER — ENOXAPARIN SODIUM 40 MG/0.4ML ~~LOC~~ SOLN
40.0000 mg | SUBCUTANEOUS | Status: DC
  Filled 2013-08-17: qty 0.4

## 2013-08-17 MED ORDER — MIDAZOLAM HCL 2 MG/2ML IJ SOLN
INTRAMUSCULAR | Status: AC
  Filled 2013-08-17: qty 2

## 2013-08-17 MED ORDER — DOCUSATE SODIUM 100 MG PO CAPS: 100.0000 mg | ORAL_CAPSULE | Freq: Two times a day (BID) | ORAL | Status: DC | PRN

## 2013-08-17 MED ORDER — SODIUM CHLORIDE 0.9 % IR SOLN
Status: DC | PRN
  Administered 2013-08-17: 1000 mL

## 2013-08-17 MED ORDER — HYDROMORPHONE HCL PF 1 MG/ML IJ SOLN
INTRAMUSCULAR | Status: AC
  Filled 2013-08-17: qty 1

## 2013-08-17 MED ORDER — PROPOFOL 10 MG/ML IV BOLUS
INTRAVENOUS | Status: AC
  Filled 2013-08-17: qty 20

## 2013-08-17 MED ORDER — FINASTERIDE 5 MG PO TABS
5.0000 mg | ORAL_TABLET | Freq: Every day | ORAL | Status: DC
  Administered 2013-08-18 – 2013-08-19 (×2): 5 mg via ORAL
  Filled 2013-08-17 (×4): qty 1

## 2013-08-17 MED ORDER — ONDANSETRON HCL 4 MG PO TABS
4.0000 mg | ORAL_TABLET | Freq: Four times a day (QID) | ORAL | Status: DC | PRN
Start: 2013-08-17 — End: 2013-08-20

## 2013-08-17 MED ORDER — BUPIVACAINE LIPOSOME 1.3 % IJ SUSP
20.0000 mL | Freq: Once | INTRAMUSCULAR | Status: AC
  Administered 2013-08-17: 20 mL
  Filled 2013-08-17: qty 20

## 2013-08-17 MED ORDER — COQ10 100 MG PO CAPS: 1.0000 | ORAL_CAPSULE | Freq: Every day | ORAL | Status: DC

## 2013-08-17 MED ORDER — CEFAZOLIN SODIUM-DEXTROSE 2-3 GM-% IV SOLR
2.0000 g | Freq: Four times a day (QID) | INTRAVENOUS | Status: AC
  Administered 2013-08-17 – 2013-08-18 (×2): 2 g via INTRAVENOUS
  Filled 2013-08-17 (×2): qty 50

## 2013-08-17 MED ORDER — POLYETHYLENE GLYCOL 3350 17 G PO PACK: 17.0000 g | PACK | Freq: Every day | ORAL | Status: DC | PRN

## 2013-08-17 MED ORDER — ENOXAPARIN SODIUM 40 MG/0.4ML ~~LOC~~ SOLN
40.0000 mg | SUBCUTANEOUS | Status: DC
  Administered 2013-08-18 – 2013-08-20 (×3): 40 mg via SUBCUTANEOUS
  Filled 2013-08-17 (×4): qty 0.4

## 2013-08-17 MED ORDER — LISINOPRIL-HYDROCHLOROTHIAZIDE 20-25 MG PO TABS: 1.0000 | ORAL_TABLET | Freq: Every day | ORAL | Status: DC

## 2013-08-17 MED ORDER — DEXAMETHASONE SODIUM PHOSPHATE 10 MG/ML IJ SOLN
INTRAMUSCULAR | Status: DC | PRN
  Administered 2013-08-17: 10 mg via INTRAVENOUS

## 2013-08-17 MED ORDER — BUPIVACAINE-EPINEPHRINE PF 0.25-1:200000 % IJ SOLN
INTRAMUSCULAR | Status: DC | PRN
  Administered 2013-08-17: 20 mL via PERINEURAL

## 2013-08-17 MED ORDER — MORPHINE SULFATE 4 MG/ML IJ SOLN
4.0000 mg | INTRAMUSCULAR | Status: DC | PRN
  Administered 2013-08-17 (×3): 4 mg via INTRAVENOUS
  Filled 2013-08-17 (×3): qty 1

## 2013-08-17 MED ORDER — ONDANSETRON HCL 4 MG/2ML IJ SOLN: 4.0000 mg | Freq: Four times a day (QID) | INTRAMUSCULAR | Status: DC | PRN

## 2013-08-17 MED ORDER — LISINOPRIL 20 MG PO TABS
20.0000 mg | ORAL_TABLET | Freq: Every day | ORAL | Status: DC
  Filled 2013-08-17: qty 1

## 2013-08-17 MED ORDER — LACTATED RINGERS IV SOLN
INTRAVENOUS | Status: DC | PRN
  Administered 2013-08-17 (×2): via INTRAVENOUS

## 2013-08-17 MED ORDER — EPHEDRINE SULFATE 50 MG/ML IJ SOLN
INTRAMUSCULAR | Status: AC
  Filled 2013-08-17: qty 1

## 2013-08-17 MED ORDER — CEFAZOLIN SODIUM-DEXTROSE 2-3 GM-% IV SOLR
INTRAVENOUS | Status: AC
  Filled 2013-08-17: qty 50

## 2013-08-17 MED ORDER — GLYCOPYRROLATE 0.2 MG/ML IJ SOLN
INTRAMUSCULAR | Status: AC
  Filled 2013-08-17: qty 3

## 2013-08-17 MED ORDER — HYDROCHLOROTHIAZIDE 25 MG PO TABS
25.0000 mg | ORAL_TABLET | Freq: Every day | ORAL | Status: DC
  Filled 2013-08-17: qty 1

## 2013-08-17 MED ORDER — BUPIVACAINE HCL (PF) 0.25 % IJ SOLN
INTRAMUSCULAR | Status: AC
  Filled 2013-08-17: qty 30

## 2013-08-17 MED ORDER — ASPIRIN 81 MG PO CHEW
162.0000 mg | CHEWABLE_TABLET | Freq: Every day | ORAL | Status: DC
  Administered 2013-08-18 – 2013-08-19 (×2): 162 mg via ORAL
  Filled 2013-08-17 (×4): qty 2

## 2013-08-17 MED ORDER — ACETAMINOPHEN 650 MG RE SUPP: 650.0000 mg | Freq: Four times a day (QID) | RECTAL | Status: DC | PRN

## 2013-08-17 MED ORDER — SODIUM CHLORIDE 0.9 % IV SOLN
INTRAVENOUS | Status: DC
  Administered 2013-08-17 – 2013-08-18 (×2): via INTRAVENOUS

## 2013-08-17 MED ORDER — METOCLOPRAMIDE HCL 10 MG PO TABS
5.0000 mg | ORAL_TABLET | Freq: Three times a day (TID) | ORAL | Status: DC | PRN
  Administered 2013-08-19: 10 mg via ORAL
  Filled 2013-08-17: qty 1

## 2013-08-17 MED ORDER — DOCUSATE SODIUM 100 MG PO CAPS
100.0000 mg | ORAL_CAPSULE | Freq: Two times a day (BID) | ORAL | Status: DC
  Administered 2013-08-17 – 2013-08-19 (×5): 100 mg via ORAL

## 2013-08-17 MED ORDER — SUCCINYLCHOLINE CHLORIDE 20 MG/ML IJ SOLN
INTRAMUSCULAR | Status: DC | PRN
  Administered 2013-08-17: 100 mg via INTRAVENOUS

## 2013-08-17 MED ORDER — MENTHOL 3 MG MT LOZG: 1.0000 | LOZENGE | OROMUCOSAL | Status: DC | PRN

## 2013-08-17 MED ORDER — SODIUM CHLORIDE 0.9 % IJ SOLN
INTRAMUSCULAR | Status: AC
  Filled 2013-08-17: qty 10

## 2013-08-17 MED ORDER — MORPHINE SULFATE 2 MG/ML IJ SOLN: 1.0000 mg | INTRAMUSCULAR | Status: DC | PRN

## 2013-08-17 MED ORDER — LACTATED RINGERS IV SOLN: INTRAVENOUS | Status: DC

## 2013-08-17 MED ORDER — FLEET ENEMA 7-19 GM/118ML RE ENEM: 1.0000 | ENEMA | Freq: Once | RECTAL | Status: AC | PRN

## 2013-08-17 MED ORDER — PHENOL 1.4 % MT LIQD: 1.0000 | OROMUCOSAL | Status: DC | PRN

## 2013-08-17 MED ORDER — ATORVASTATIN CALCIUM 20 MG PO TABS
20.0000 mg | ORAL_TABLET | ORAL | Status: DC
  Filled 2013-08-17: qty 1

## 2013-08-17 MED ORDER — METHOCARBAMOL 100 MG/ML IJ SOLN
500.0000 mg | Freq: Four times a day (QID) | INTRAMUSCULAR | Status: DC | PRN
  Filled 2013-08-17 (×2): qty 5

## 2013-08-17 MED ORDER — ACETAMINOPHEN 325 MG PO TABS: 650.0000 mg | ORAL_TABLET | Freq: Four times a day (QID) | ORAL | Status: DC | PRN

## 2013-08-17 MED ORDER — TRAMADOL HCL 50 MG PO TABS
50.0000 mg | ORAL_TABLET | Freq: Four times a day (QID) | ORAL | Status: DC | PRN
  Administered 2013-08-18: 50 mg via ORAL
  Administered 2013-08-19 (×2): 100 mg via ORAL
  Filled 2013-08-17 (×2): qty 2
  Filled 2013-08-17: qty 1

## 2013-08-17 MED ORDER — METHOCARBAMOL 500 MG PO TABS
500.0000 mg | ORAL_TABLET | Freq: Four times a day (QID) | ORAL | Status: DC | PRN
  Administered 2013-08-19 (×3): 500 mg via ORAL
  Filled 2013-08-17 (×4): qty 1

## 2013-08-17 MED ORDER — ONDANSETRON HCL 4 MG/2ML IJ SOLN
INTRAMUSCULAR | Status: DC | PRN
  Administered 2013-08-17: 4 mg via INTRAVENOUS

## 2013-08-17 MED ORDER — VITAMIN D 1000 UNITS PO TABS
1000.0000 [IU] | ORAL_TABLET | Freq: Every day | ORAL | Status: DC
  Administered 2013-08-18 – 2013-08-19 (×2): 1000 [IU] via ORAL
  Filled 2013-08-17 (×4): qty 1

## 2013-08-17 MED ORDER — CEFAZOLIN SODIUM-DEXTROSE 2-3 GM-% IV SOLR
2.0000 g | Freq: Once | INTRAVENOUS | Status: AC
  Administered 2013-08-17: 2 g via INTRAVENOUS
  Filled 2013-08-17: qty 50

## 2013-08-17 MED ORDER — ROCURONIUM BROMIDE 100 MG/10ML IV SOLN
INTRAVENOUS | Status: AC
  Filled 2013-08-17: qty 1

## 2013-08-17 MED ORDER — BISACODYL 10 MG RE SUPP: 10.0000 mg | Freq: Every day | RECTAL | Status: DC | PRN

## 2013-08-17 MED ORDER — ONDANSETRON HCL 4 MG PO TABS: 4.0000 mg | ORAL_TABLET | Freq: Four times a day (QID) | ORAL | Status: DC | PRN

## 2013-08-17 MED ORDER — SODIUM CHLORIDE 0.9 % IV SOLN
INTRAVENOUS | Status: DC
  Administered 2013-08-17: 01:00:00 via INTRAVENOUS

## 2013-08-17 MED ORDER — METHOCARBAMOL 100 MG/ML IJ SOLN
500.0000 mg | Freq: Four times a day (QID) | INTRAVENOUS | Status: DC | PRN
  Administered 2013-08-17: 500 mg via INTRAVENOUS
  Filled 2013-08-17 (×2): qty 5

## 2013-08-17 MED ORDER — ADULT MULTIVITAMIN W/MINERALS CH
1.0000 | ORAL_TABLET | Freq: Every day | ORAL | Status: DC
  Administered 2013-08-18 – 2013-08-19 (×2): 1 via ORAL
  Filled 2013-08-17 (×4): qty 1

## 2013-08-17 MED ORDER — SODIUM CHLORIDE 0.9 % IJ SOLN
INTRAMUSCULAR | Status: AC
  Filled 2013-08-17: qty 50

## 2013-08-17 MED ORDER — HYDROMORPHONE HCL PF 1 MG/ML IJ SOLN
0.2500 mg | INTRAMUSCULAR | Status: DC | PRN
  Administered 2013-08-17: 0.5 mg via INTRAVENOUS
  Administered 2013-08-17 (×2): 0.25 mg via INTRAVENOUS

## 2013-08-17 MED ORDER — PROPOFOL 10 MG/ML IV BOLUS
INTRAVENOUS | Status: DC | PRN
  Administered 2013-08-17: 120 mg via INTRAVENOUS

## 2013-08-17 MED ORDER — DEXAMETHASONE SODIUM PHOSPHATE 10 MG/ML IJ SOLN
INTRAMUSCULAR | Status: AC
  Filled 2013-08-17: qty 1

## 2013-08-17 MED ORDER — ONDANSETRON HCL 4 MG/2ML IJ SOLN
INTRAMUSCULAR | Status: AC
  Filled 2013-08-17: qty 2

## 2013-08-17 MED ORDER — METOPROLOL TARTRATE 12.5 MG HALF TABLET
12.5000 mg | ORAL_TABLET | Freq: Two times a day (BID) | ORAL | Status: DC
  Administered 2013-08-17: 12.5 mg via ORAL
  Filled 2013-08-17 (×4): qty 1

## 2013-08-17 MED ORDER — EPHEDRINE SULFATE 50 MG/ML IJ SOLN
INTRAMUSCULAR | Status: DC | PRN
  Administered 2013-08-17 (×2): 10 mg via INTRAVENOUS

## 2013-08-17 MED ORDER — NEOSTIGMINE METHYLSULFATE 1 MG/ML IJ SOLN
INTRAMUSCULAR | Status: AC
  Filled 2013-08-17: qty 10

## 2013-08-17 MED ORDER — METOCLOPRAMIDE HCL 5 MG/ML IJ SOLN: 5.0000 mg | Freq: Three times a day (TID) | INTRAMUSCULAR | Status: DC | PRN

## 2013-08-17 MED ORDER — SENNA 8.6 MG PO TABS
1.0000 | ORAL_TABLET | Freq: Every day | ORAL | Status: DC
  Administered 2013-08-18 – 2013-08-19 (×2): 8.6 mg via ORAL

## 2013-08-17 MED ORDER — HYDROCODONE-ACETAMINOPHEN 5-325 MG PO TABS
1.0000 | ORAL_TABLET | ORAL | Status: DC | PRN
  Administered 2013-08-17 – 2013-08-18 (×2): 1 via ORAL
  Filled 2013-08-17 (×2): qty 1

## 2013-08-17 MED ORDER — FENTANYL CITRATE 0.05 MG/ML IJ SOLN
INTRAMUSCULAR | Status: AC
Start: 2013-08-17 — End: 2013-08-17
  Filled 2013-08-17: qty 5

## 2013-08-17 SURGICAL SUPPLY — 49 items
BAG SPEC THK2 15X12 ZIP CLS (MISCELLANEOUS) ×1
BAG ZIPLOCK 12X15 (MISCELLANEOUS) ×2 IMPLANT
BIT DRILL 2.8X128 (BIT) ×2 IMPLANT
BLADE SAW SAG 73X25 THK (BLADE) ×1
BLADE SAW SGTL 73X25 THK (BLADE) ×1 IMPLANT
BRUSH FEMORAL CANAL (MISCELLANEOUS) ×2 IMPLANT
CAPT HIP FX BIPOLAR/UNIPOLAR ×1 IMPLANT
CEMENT BONE DEPUY (Cement) ×5 IMPLANT
CEMENT RESTRICTOR DEPUY SZ 3 (Cement) ×1 IMPLANT
DRAPE INCISE IOBAN 66X45 STRL (DRAPES) ×2 IMPLANT
DRAPE ORTHO SPLIT 77X108 STRL (DRAPES) ×2
DRAPE POUCH INSTRU U-SHP 10X18 (DRAPES) ×2 IMPLANT
DRAPE SURG ORHT 6 SPLT 77X108 (DRAPES) ×1 IMPLANT
DRAPE U-SHAPE 47X51 STRL (DRAPES) ×2 IMPLANT
DRSG ADAPTIC 3X8 NADH LF (GAUZE/BANDAGES/DRESSINGS) ×1 IMPLANT
DRSG EMULSION OIL 3X16 NADH (GAUZE/BANDAGES/DRESSINGS) ×2 IMPLANT
DRSG MEPILEX BORDER 4X4 (GAUZE/BANDAGES/DRESSINGS) ×2 IMPLANT
DRSG MEPILEX BORDER 4X8 (GAUZE/BANDAGES/DRESSINGS) ×2 IMPLANT
DURAPREP 26ML APPLICATOR (WOUND CARE) ×2 IMPLANT
ELECT REM PT RETURN 9FT ADLT (ELECTROSURGICAL) ×2
ELECTRODE REM PT RTRN 9FT ADLT (ELECTROSURGICAL) ×1 IMPLANT
EVACUATOR 1/8 PVC DRAIN (DRAIN) ×2 IMPLANT
FACESHIELD LNG OPTICON STERILE (SAFETY) ×6 IMPLANT
GLOVE BIO SURGEON STRL SZ7.5 (GLOVE) ×2 IMPLANT
GLOVE BIO SURGEON STRL SZ8 (GLOVE) ×4 IMPLANT
GLOVE BIOGEL PI IND STRL 8 (GLOVE) ×1 IMPLANT
GLOVE BIOGEL PI INDICATOR 8 (GLOVE) ×1
GOWN STRL REUS W/TWL LRG LVL3 (GOWN DISPOSABLE) ×2 IMPLANT
GOWN STRL REUS W/TWL XL LVL3 (GOWN DISPOSABLE) ×2 IMPLANT
HANDPIECE INTERPULSE COAX TIP (DISPOSABLE) ×2
IMMOBILIZER KNEE 20 (SOFTGOODS) ×1 IMPLANT
KIT BASIN OR (CUSTOM PROCEDURE TRAY) ×2 IMPLANT
MANIFOLD NEPTUNE II (INSTRUMENTS) ×2 IMPLANT
PACK TOTAL JOINT (CUSTOM PROCEDURE TRAY) ×2 IMPLANT
PAD ABD 8X10 STRL (GAUZE/BANDAGES/DRESSINGS) ×2 IMPLANT
PASSER SUT SWANSON 36MM LOOP (INSTRUMENTS) ×2 IMPLANT
POSITIONER SURGICAL ARM (MISCELLANEOUS) ×2 IMPLANT
PRESSURIZER FEMORAL UNIV (MISCELLANEOUS) ×2 IMPLANT
SET HNDPC FAN SPRY TIP SCT (DISPOSABLE) ×1 IMPLANT
SPONGE GAUZE 4X4 12PLY (GAUZE/BANDAGES/DRESSINGS) ×3 IMPLANT
STRIP CLOSURE SKIN 1/2X4 (GAUZE/BANDAGES/DRESSINGS) ×2 IMPLANT
SUT ETHIBOND NAB CT1 #1 30IN (SUTURE) ×4 IMPLANT
SUT MNCRL AB 4-0 PS2 18 (SUTURE) ×2 IMPLANT
SUT VIC AB 1 CT1 27 (SUTURE) ×6
SUT VIC AB 1 CT1 27XBRD ANTBC (SUTURE) ×3 IMPLANT
SUT VIC AB 2-0 CT1 27 (SUTURE) ×6
SUT VIC AB 2-0 CT1 TAPERPNT 27 (SUTURE) ×3 IMPLANT
TOWEL OR 17X26 10 PK STRL BLUE (TOWEL DISPOSABLE) ×4 IMPLANT
TOWER CARTRIDGE SMART MIX (DISPOSABLE) ×2 IMPLANT

## 2013-08-17 NOTE — Progress Notes (Signed)
Clinical Social Work Department BRIEF PSYCHOSOCIAL ASSESSMENT 08/17/2013  Patient:  [name] [name], [name] [name]     Account Number:  [id number]     Admit date:  08/16/2013  Clinical Social Worker:  Lacie Scotts  Date/Time:  08/17/2013 03:44 PM  Referred by:  Physician  Date Referred:  08/17/2013 Referred for  SNF Placement   Other Referral:   Interview type:  Patient Other interview type:    PSYCHOSOCIAL DATA Living Status:  ALONE Admitted from facility:   Level of care:   Primary support name:  [name] Primary support relationship to patient:  CHILD, ADULT Degree of support available:   supportive    CURRENT CONCERNS Current Concerns  Post-Acute Placement   Other Concerns:    SOCIAL WORK ASSESSMENT / PLAN Pt is an 80 yr old gentleman living at home prior to hospitalization. CSW met with pt / daughter to assist with d/c planning. Pt slipped on ice and fx his hip. He is scheduled for surgery today. ST Rehab will be needed following hospital d/c. Pt / family are in agreement with this plan. SNF search has been initiated and family has been contacted to provide bed offers. CSW will continue to follow to assist with d/c planning.   Assessment/plan status:  Psychosocial Support/Ongoing Assessment of Needs Other assessment/ plan:   Information/referral to community resources:   SNF list has been provided. Insurance cover for SNF and ambulance transport reviewed.    PATIENT'S/FAMILY'S RESPONSE TO PLAN OF CARE: Pt is patiently awaiting surgery. He says his pain is being controlled. He has had several visitors today which is making the time go by faster. Pt states he will be happy when surgery is over and he can begin recovery. Pt is not looking forward to rehab. He would rather return home but understands rehab is needed.   Werner Lean LCSW [phone]

## 2013-08-17 NOTE — Op Note (Signed)
OPERATIVE REPORT   PREOPERATIVE DIAGNOSIS:  Left femoral neck fracture displaced.   POSTOPERATIVE DIAGNOSIS:  Left femoral neck fracture displaced.   PROCEDURE:  Left hip hemiarthroplasty.   SURGEON: Gaynelle Arabian, M.D.   ASSISTANT: Arlee Muslim, PA-C  ANESTHESIA: General  ESTIMATED BLOOD LOSS:100 ml    DRAINS: Hemovac x1.   COMPLICATIONS: None.   CONDITION: Stable to recovery.   BRIEF CLINICAL NOTE: [name] [name] is an 80 y.o. male with  significant dementia who had a fall approximately 12 days ago with hip pain initially but with the ability to bear weight until yesterday when he developed severe pain and inability to bear weight. Radiographs showed  a displaced   Left femoral neck fracture. He was evaluated in the emergency  department and admitted to the medical service. He was cleared for  surgery and presents for right hip hemiarthroplasty.   PROCEDURE IN DETAIL: After successful administration of general  anesthetic, the patient was placed in the Right lateral decubitus  position with the Left side up and held with the hip positioner.Left lower extremity was isolated from her perineum with plastic drapes and  prepped and draped in the usual sterile fashion. Short posterolateral  incision was made with a 10 blade through the subcutaneous tissue to the  level of fascia lata which was incised in line with the skin incision.  The sciatic nerve was palpated and protected, and short rotators and  capsule were isolated off the femur. There was complete fracture of the  femoral neck. The femoral head was removed. Diameters 54 mm. We then  isolated the femur with retractors and freshened up the femoral neck cut  with an oscillating saw. I used a starter reamer to get into the  femoral canal and then irrigated with saline. A lateralizing reamer was  then placed. We then broached up to a size 5, which had excellent  rotational and torsional stability. Trial neck for the size  5 with a  28 + 1.5 head and 54 bipolar was placed. Hip was reduced with excellent  stability. I placed the left leg on top of the right leg, and lengths  were equal. Hip was dislocated. All trials were removed. We trialed  the cement restrictor, and size 3 was most appropriate. The size 3  cement restrictor was placed at the appropriate depth in the femoral  canal. Canal was then prepared with pulsatile lavage and thoroughly  dried. Cement was mixed and once ready implantation was injected into  the femoral canal and pressurized. The size 5 [place] basic  cemented stem was then cemented into place in about 20 degrees of  anteversion. When the cement was fully hardened, then the permanent 28 + 1.5  head and 54 bipolar components were placed. Hip was reduced with  same stability parameters. Capsule and short rotators reattached to the  femur through drill holes with Ethibond suture. 20 ml Exparel mixed with 30 ml saline were injected into the gluteal muscles, fascia lata and subcutaneous tissues and an additional 20 ml of .25% Marcaine was injected into the same tissues. Fascia lata was closed  over Hemovac drain with running #1 V-Loc suture. Subcu closed with 2-0  Vicryl, and subcuticular running 4-0 Monocryl. The drains were hooked  to suction. Incision was cleaned and dried and Steri-Strips and a bulky  sterile dressing applied. She was then placed into a knee immobilizer,  awakened and transported to recovery in stable condition.   Gaynelle Arabian, M.D.

## 2013-08-17 NOTE — Anesthesia Postprocedure Evaluation (Signed)
  Anesthesia Post-op Note  Patient: [name] [name]  Procedure(s) Performed: Procedure(s) (LRB): ARTHROPLASTY BIPOLAR HIP (Left)  Patient Location: PACU  Anesthesia Type: General  Level of Consciousness: awake and alert   Airway and Oxygen Therapy: Patient Spontanous Breathing  Post-op Pain: mild  Post-op Assessment: Post-op Vital signs reviewed, Patient's Cardiovascular Status Stable, Respiratory Function Stable, Patent Airway and No signs of Nausea or vomiting  Last Vitals:  Filed Vitals:   08/17/13 2048  BP: 152/64  Pulse:   Temp:   Resp:     Post-op Vital Signs: stable   Complications: No apparent anesthesia complications

## 2013-08-17 NOTE — Preoperative (Addendum)
Beta Blockers   Reason not to administer Beta Blockers:Not Applicable, HR 40

## 2013-08-17 NOTE — Anesthesia Preprocedure Evaluation (Addendum)
Anesthesia Evaluation  Patient identified by MRN, date of birth, ID band Patient awake    Reviewed: Allergy & Precautions, H&P , NPO status , Patient's Chart, lab work & pertinent test results  Airway Mallampati: II TM Distance: >3 FB Neck ROM: full    Dental no notable dental hx. (+) Teeth Intact, Dental Advisory Given   Pulmonary neg pulmonary ROS, former smoker,  breath sounds clear to auscultation  Pulmonary exam normal       Cardiovascular hypertension, Pt. on medications + CAD Rhythm:regular Rate:Normal  Moderate CAD on cardiac cath 1997. Chronic diastolic heart failure.  Severe sinus bradycardia.  No CP or cardiac symptoms   Neuro/Psych negative neurological ROS  negative psych ROS   GI/Hepatic negative GI ROS, Neg liver ROS,   Endo/Other  negative endocrine ROS  Renal/GU negative Renal ROS  negative genitourinary   Musculoskeletal   Abdominal   Peds  Hematology negative hematology ROS (+)   Anesthesia Other Findings   Reproductive/Obstetrics negative OB ROS                         Anesthesia Physical Anesthesia Plan  ASA: III  Anesthesia Plan: General   Post-op Pain Management:    Induction: Intravenous  Airway Management Planned: Oral ETT  Additional Equipment:   Intra-op Plan:   Post-operative Plan: Extubation in OR  Informed Consent: I have reviewed the patients History and Physical, chart, labs and discussed the procedure including the risks, benefits and alternatives for the proposed anesthesia with the patient or authorized representative who has indicated his/her understanding and acceptance.   Dental Advisory Given  Plan Discussed with: CRNA and Surgeon  Anesthesia Plan Comments:         Anesthesia Quick Evaluation

## 2013-08-17 NOTE — Progress Notes (Addendum)
[facility] PROGRESS NOTE  [name] [name] OEU:[id number] DOB: [birth year] DOA: 08/16/2013 PCP: [name], MD  Assessment/Plan: 80 y/o male with PMH of HTN, HPL, BPH, CAD admitted with mechanical fall and hip fracture   1. Fall mechanical L hip fracture  -defer management to ortho; OR today    2. CAD no active cardiopulmonary symptoms;  -echo (2013): LVEF 55%, normal wall motion;  -cont ASA, statin, BP control; hold to start BB due h/o bradycardia   3. HTN stable; hold diuretics   4. HPL on statin   Code Status: full Family Communication: d/w patient, friends at the bedside;  (indicate person spoken with, relationship, and if by phone, the number) Disposition Plan: home per ortho   Consultants:  ortho  Procedures:  Pend surgery  Antibiotics:  none (indicate start date, and stop date if known)  HPI/Subjective: alert  Objective: Filed Vitals:   08/17/13 1019  BP: 151/72  Pulse: 51  Temp: 98.6 F (37 C)  Resp: 16    Intake/Output Summary (Last 24 hours) at 08/17/13 1142 Last data filed at 08/17/13 1121  Gross per 24 hour  Intake 509.17 ml  Output    200 ml  Net 309.17 ml   Filed Weights   08/17/13 0020  Weight: 74.844 kg (165 lb)    Exam:   General:  alert  Cardiovascular: s1,s2 rrr  Respiratory: CTA BL  Abdomen: soft, nt, nd   Musculoskeletal: no LE edema   Data Reviewed: Basic Metabolic Panel:  Recent Labs Lab 08/16/13 2042  NA 139  K 3.7  CL 98  CO2 26  GLUCOSE 105*  BUN 26*  CREATININE 1.18  CALCIUM 10.0   Liver Function Tests: No results found for this basename: AST, ALT, ALKPHOS, BILITOT, PROT, ALBUMIN,  in the last 168 hours No results found for this basename: LIPASE, AMYLASE,  in the last 168 hours No results found for this basename: AMMONIA,  in the last 168 hours CBC:  Recent Labs Lab 08/16/13 2042  WBC 8.6  NEUTROABS 5.6  HGB 15.1  HCT 42.5  MCV 91.2  PLT 214   Cardiac Enzymes: No  results found for this basename: CKTOTAL, CKMB, CKMBINDEX, TROPONINI,  in the last 168 hours BNP (last 3 results) No results found for this basename: PROBNP,  in the last 8760 hours CBG: No results found for this basename: GLUCAP,  in the last 168 hours  Recent Results (from the past 240 hour(s))  SURGICAL PCR SCREEN     Status: None   Collection Time    08/17/13  9:11 AM      Result Value Ref Range Status   MRSA, PCR NEGATIVE  NEGATIVE Final   Staphylococcus aureus NEGATIVE  NEGATIVE Final   Comment:            The Xpert SA Assay (FDA     approved for NASAL specimens     in patients over 66 years of age),     is one component of     a comprehensive surveillance     program.  Test performance has     been validated by [employer] for patients greater     than or equal to 69 year old.     It is not intended     to diagnose infection nor to     guide or monitor treatment.     Studies: Dg Chest 1 View  08/16/2013   CLINICAL DATA:  Hip injury.  EXAM: CHEST - 1 VIEW  COMPARISON:  03/22/2009  FINDINGS: Normal heart size and mediastinal contours. No acute infiltrate or edema. No effusion or pneumothorax. No acute osseous findings.  IMPRESSION: No active disease.   Electronically Signed   By: Jorje Guild M.D.   On: 08/16/2013 21:16   Dg Hip Complete Left  08/16/2013   CLINICAL DATA:  Fall, left hip pain  EXAM: LEFT HIP - COMPLETE 2+ VIEW  COMPARISON:  None.  FINDINGS: There is an acute left hip subcapital femoral neck fracture with slight displacement. Diffuse soft tissue swelling. Bones are osteopenic. Pelvis and right hip appear intact. No diastases.  IMPRESSION: Acute left hip subcapital femoral neck fracture   Electronically Signed   By: Daryll Brod M.D.   On: 08/16/2013 21:18    Scheduled Meds: . aspirin  162 mg Oral Daily  . [START ON 08/18/2013] atorvastatin  20 mg Oral Once per day on Mon Thu  .  ceFAZolin (ANCEF) IV  2 g Intravenous Once  . cholecalciferol  1,000 Units  Oral Daily  . finasteride  5 mg Oral Daily  . lisinopril  20 mg Oral Daily   And  . hydrochlorothiazide  25 mg Oral Daily  . multivitamin with minerals  1 tablet Oral Daily   Continuous Infusions: . sodium chloride 50 mL/hr at 08/17/13 0110    Principal Problem:   Femur fracture, left Active Problems:   Coronary artery disease   Hyperlipidemia   Hypertension   Chronic diastolic heart failure    Time spent: >35 minutes     Kinnie Feil  [facility] Pager [phone]. If 7PM-7AM, please contact night-coverage at [email or web address], password [facility] 08/17/2013, 11:42 AM  LOS: 1 day

## 2013-08-17 NOTE — Interval H&P Note (Signed)
History and Physical Interval Note:  08/17/2013 4:19 PM  [name] [name]  has presented today for surgery, with the diagnosis of FRACTURED LEFT HIP  The various methods of treatment have been discussed with the patient and family. After consideration of risks, benefits and other options for treatment, the patient has consented to  Procedure(s): ARTHROPLASTY BIPOLAR HIP (Left) as a surgical intervention .  The patient's history has been reviewed, patient examined, no change in status, stable for surgery.  I have reviewed the patient's chart and labs.  Questions were answered to the patient's satisfaction.     Gearlean Alf

## 2013-08-17 NOTE — Progress Notes (Signed)
Called anesthesia about holding beta blocker this AM before surgery. Anesthesia confirmed holding this medication.

## 2013-08-17 NOTE — Progress Notes (Signed)
PHARMACIST - PHYSICIAN ORDER COMMUNICATION  CONCERNING: P&T Medication Policy on Herbal Medications  DESCRIPTION:  This patient's order for: CoQ10   has been noted.  This product(s) is classified as an "herbal" or natural product. Due to a lack of definitive safety studies or FDA approval, nonstandard manufacturing practices, plus the potential risk of unknown drug-drug interactions while on inpatient medications, the Pharmacy and Therapeutics Committee does not permit the use of "herbal" or natural products of this type within [facility].   ACTION TAKEN: The pharmacy department is unable to verify this order at this time and your patient has been informed of this safety policy. Please reevaluate patient's clinical condition at discharge and address if the herbal or natural product(s) should be resumed at that time.   Thanks! Dorrene German 08/17/2013 12:31 AM

## 2013-08-17 NOTE — H&P (Signed)
[facility] History and Physical  [name] [name] WUG:[id number] DOB: 01-26-1925 DOA: 08/16/2013  Referring physician: Dr. Tanna Furry PCP: [name], MD   Chief Complaint:  Sent from orthopedics clinic for left femur fracture on MRI  HPI:  80 year old fully  functional male with history of coronary artery disease (followed by Dr. Acie Fredrickson), hypertension and hyperlipidemia who slipped on icy surface at home and landed on his left hip. Initially patient felt fine but from the second day he had pain over his left hip with painful gait. He subsequently had x-ray of the hip done which did not show any fracture. However patient had persistent pain in his left hip and today it was quite severe. He went to see his orthopedist and had an MRI done and including imaging which showed left subcapital hip fracture and was sent to the ED.  Patient denies headache, dizziness, fever, chills, nausea , vomiting, chest pain, palpitations, SOB, abdominal pain, bowel or urinary symptoms. Denies change in weight or appetite.  Course in the ED  Patient's vitals were stable. Blood work unremarkable. X-ray of the left hip showed an acute subcapital femoral neck fracture. Dr. Ricki Rodriguez was consulted by ED physician who plans on taking patient to OR tomorrow morning. [facility] consulted for admission to medical floor.  Review of Systems:  Constitutional: Denies fever, chills, diaphoresis, appetite change and fatigue.  HEENT: Denies photophobia, hearing loss, ear pain, congestion, sore throat, rhinorrhea, sneezing,  trouble swallowing, neck pain, neck stiffness and tinnitus.   Respiratory: Denies SOB, DOE, cough, chest tightness,  and wheezing.   Cardiovascular: Denies chest pain, palpitations and leg swelling.  Gastrointestinal: Denies nausea, vomiting, abdominal pain, diarrhea, constipation, blood in stool and abdominal distention.  Genitourinary: Denies dysuria, urgency, frequency, hematuria,  flank pain and difficulty urinating.  Musculoskeletal: Pain over her left hip with painful gait, Denies myalgias, back pain, joint swelling, arthralgias  Skin: Denies pallor, rash and wound.  Neurological: Denies dizziness, seizures, syncope, weakness, light-headedness, numbness and headaches.     Past Medical History  Diagnosis Date  . Coronary artery disease     MODERATE  . Hyperlipidemia   . Hypertension   . Headache(784.0)   . Internal hemorrhoids 2009    Dr. Lajoyce Corners Colonoscopy  . History of colon polyps 2009    Dr. Orr/Colonoscopy -small cecal adenoma  . H. pylori infection     Hx of    Past Surgical History  Procedure Laterality Date  . Cardiac catheterization  1997    REVEALED MILD TO MODERATE IRREGULARITIES. HIS LEFT EVNTRICULAR SYSTOLIC FUNCTION REVEALED NORMAL EJECTION FRACTION WITH EF OF 70%  . Appendectomy    . Inguinal hernia repair    . Tonsillectomy    . Hemorroidectomy    . Lower back surgery    . Colonoscopy  multiple  . Upper gastrointestinal endoscopy     Social History:  reports that he quit smoking about 37 years ago. He has never used smokeless tobacco. He reports that he drinks about 1.2 ounces of alcohol per week. He reports that he does not use illicit drugs.  No Known Allergies  Family History  Problem Relation Age of Onset  . Colon cancer Mother 64    second cancer in 86's deceased after emergency surgery  . Breast cancer Sister     Prior to Admission medications   Medication Sig Start Date End Date Taking? Authorizing Provider  amLODipine (NORVASC) 10 MG tablet TAKE ONE TABLET BY MOUTH ONCE DAILY.  PT  NEEDS TO SCHEDULE APPOINTMENT FOR FURTHER REFILLS.   Yes Thayer Headings, MD  aspirin 325 MG tablet Take 325 mg by mouth daily. 1/2 DAILY    Yes Historical Provider, MD  atorvastatin (LIPITOR) 20 MG tablet Take 20 mg by mouth. 2 Times a Week 04/14/12  Yes Thayer Headings, MD  Cholecalciferol (VITAMIN D3) 1000 UNITS CAPS Take by mouth daily.      Yes Historical Provider, MD  Coenzyme Q10 (COQ10) 100 MG CAPS Take by mouth daily.     Yes Historical Provider, MD  finasteride (PROSCAR) 5 MG tablet Take 5 mg by mouth daily.     Yes Historical Provider, MD  furosemide (LASIX) 20 MG tablet Take 40 mg by mouth as needed for fluid.    Yes Historical Provider, MD  KRILL OIL PO Take 1 capsule by mouth daily.   Yes Historical Provider, MD  lisinopril-hydrochlorothiazide (PRINZIDE,ZESTORETIC) 20-25 MG per tablet Take 1 tablet by mouth daily.     Yes Historical Provider, MD  Multiple Vitamin (MULTIVITAMIN) tablet Take 1 tablet by mouth daily.     Yes Historical Provider, MD  Multiple Vitamins-Minerals (ZINC PO) Take by mouth 3 (three) times a week. Monday, Wednesday, and Friday.   Yes Historical Provider, MD  NIACIN PO Take by mouth. 5 days week   Yes Historical Provider, MD  traMADol (ULTRAM) 50 MG tablet Take 50 mg by mouth every 6 (six) hours as needed.   Yes Historical Provider, MD     Physical Exam:  Filed Vitals:   08/16/13 1921 08/16/13 2335  BP: 169/71 135/59  Pulse: 64 63  Temp: 97.6 F (36.4 C) 97.4 F (36.3 C)  TempSrc: Oral Oral  Resp: 18 16  SpO2: 98% 98%    Constitutional: Vital signs reviewed.  Patient is an elderly well-developed and well-nourished male in no acute distress. HEENT: no pallor, no icterus, moist oral mucosa,  Cardiovascular: RRR, S1 normal, S2 normal, no MRG Chest: CTAB, no wheezes, rales, or rhonchi Abdominal: Soft. Non-tender, non-distended, bowel sounds are normal,  Ext: warm, no edema, painful ROM of left hip  Neurological: A&O x3, non focal  Labs on Admission:  Basic Metabolic Panel:  Recent Labs Lab 08/16/13 2042  NA 139  K 3.7  CL 98  CO2 26  GLUCOSE 105*  BUN 26*  CREATININE 1.18  CALCIUM 10.0   Liver Function Tests: No results found for this basename: AST, ALT, ALKPHOS, BILITOT, PROT, ALBUMIN,  in the last 168 hours No results found for this basename: LIPASE, AMYLASE,  in the last  168 hours No results found for this basename: AMMONIA,  in the last 168 hours CBC:  Recent Labs Lab 08/16/13 2042  WBC 8.6  NEUTROABS 5.6  HGB 15.1  HCT 42.5  MCV 91.2  PLT 214   Cardiac Enzymes: No results found for this basename: CKTOTAL, CKMB, CKMBINDEX, TROPONINI,  in the last 168 hours BNP: No components found with this basename: POCBNP,  CBG: No results found for this basename: GLUCAP,  in the last 168 hours  Radiological Exams on Admission: Dg Chest 1 View  08/16/2013   CLINICAL DATA:  Hip injury.  EXAM: CHEST - 1 VIEW  COMPARISON:  03/22/2009  FINDINGS: Normal heart size and mediastinal contours. No acute infiltrate or edema. No effusion or pneumothorax. No acute osseous findings.  IMPRESSION: No active disease.   Electronically Signed   By: Jorje Guild M.D.   On: 08/16/2013 21:16   Dg Hip Complete Left  08/16/2013   CLINICAL DATA:  Fall, left hip pain  EXAM: LEFT HIP - COMPLETE 2+ VIEW  COMPARISON:  None.  FINDINGS: There is an acute left hip subcapital femoral neck fracture with slight displacement. Diffuse soft tissue swelling. Bones are osteopenic. Pelvis and right hip appear intact. No diastases.  IMPRESSION: Acute left hip subcapital femoral neck fracture   Electronically Signed   By: Daryll Brod M.D.   On: 08/16/2013 21:18    EKG: Pending  Assessment/Plan  Principal Problem:   Femur fracture, left Admit to MedSurg Pain control with when necessary Tylenol and Vicodin. Added when necessary Robaxin for muscle spasms. N.p.o. after midnight for or tomorrow. Dr. Ricki Rodriguez consulted by ED. -Given history of coronary artery disease he would benefit from perioperative beta blocker . I will start him on low dose metoprolol 12.5 mg twice a day. -DVT prophylaxis with subcutaneous Lovenox 40 mg daily. Postop DVT prophylaxis and pain medications will be addressed by orthopedics. -PT/OT eval postop. Plan for skilled nursing facility upon discharge.  Active Problems:    Coronary artery disease Continue aspirin and statin. 2-D echo from 2013 showing  normal EF and grade 1 diastolic dysfunction. Patient clinically euvolemic.    Hyperlipidemia Continue statin    Hypertension Continue HCTZ-lisinopril. Will hold amlodipine as patient started on perioperative beta blocker (history of severe sinus bradycardia in the problem list).    Diet: N.p.o. after midnight  DVT prophylaxis: sq lovenox   Code Status: full code Family Communication: discussed with patient and his friend at bedside Disposition Plan: Likely needs skilled nursing facility postop  Louellen Molder [facility] Pager [phone]  Total time spent on admission :60 minutes  If 7PM-7AM, please contact night-coverage [email or web address] Password [email or web address] 08/17/2013, 12:01 AM

## 2013-08-17 NOTE — Consult Note (Signed)
Reason for Consult:Left hip fracture Referring Physician: [name] is an 80 y.o. male.  HPI: [name] [name] is an 80 yo male who slipped on ice in his yard on 2/20 landing on his left hip and buttock area and sliding down a small embankment. He was able to get up and walk with slight left hip pain. The pain increased and he was still able to walk until yesterday when he had a major increase in pain. He was seen by Dr. Drema Dallas in our office yesterday and had an x-ray and MRI showing a left femoral neck fracture. He was sent to [facility] ED and admitted by the hospitalist service. His only complaint is left hip pain and now inability to ambulate. His x-rays last night showed that there is now angulation of the femoral neck. He presents now for operative fixation of this fracture.   Past Medical History  Diagnosis Date  . Coronary artery disease     MODERATE  . Hyperlipidemia   . Hypertension   . Headache(784.0)   . Internal hemorrhoids 2009    Dr. Lajoyce Corners Colonoscopy  . History of colon polyps 2009    Dr. Orr/Colonoscopy -small cecal adenoma  . H. pylori infection     Hx of     Past Surgical History  Procedure Laterality Date  . Cardiac catheterization  1997    REVEALED MILD TO MODERATE IRREGULARITIES. HIS LEFT EVNTRICULAR SYSTOLIC FUNCTION REVEALED NORMAL EJECTION FRACTION WITH EF OF 70%  . Appendectomy    . Inguinal hernia repair    . Tonsillectomy    . Hemorroidectomy    . Lower back surgery    . Colonoscopy  multiple  . Upper gastrointestinal endoscopy      Family History  Problem Relation Age of Onset  . Colon cancer Mother 49    second cancer in 45's deceased after emergency surgery  . Breast cancer Sister     Social History:  reports that he quit smoking about 37 years ago. He has never used smokeless tobacco. He reports that he drinks about 1.2 ounces of alcohol per week. He reports that he does not use illicit drugs.  Allergies: No Known  Allergies  Medications: I have reviewed the patient's current medications.  Results for orders placed during the hospital encounter of 08/16/13 (from the past 48 hour(s))  ABO/RH     Status: None   Collection Time    08/16/13  8:27 PM      Result Value Ref Range   ABO/RH(D) B NEG    BASIC METABOLIC PANEL     Status: Abnormal   Collection Time    08/16/13  8:42 PM      Result Value Ref Range   Sodium 139  137 - 147 mEq/L   Potassium 3.7  3.7 - 5.3 mEq/L   Chloride 98  96 - 112 mEq/L   CO2 26  19 - 32 mEq/L   Glucose, Bld 105 (*) 70 - 99 mg/dL   BUN 26 (*) 6 - 23 mg/dL   Creatinine, Ser 1.18  0.50 - 1.35 mg/dL   Calcium 10.0  8.4 - 10.5 mg/dL   GFR calc non Af Amer 54 (*) >90 mL/min   GFR calc Af Amer 63 (*) >90 mL/min   Comment: (NOTE)     The eGFR has been calculated using the CKD EPI equation.     This calculation has not been validated in all clinical situations.  eGFR's persistently <90 mL/min signify possible Chronic Kidney     Disease.  CBC WITH DIFFERENTIAL     Status: Abnormal   Collection Time    08/16/13  8:42 PM      Result Value Ref Range   WBC 8.6  4.0 - 10.5 K/uL   RBC 4.66  4.22 - 5.81 MIL/uL   Hemoglobin 15.1  13.0 - 17.0 g/dL   HCT 42.5  39.0 - 52.0 %   MCV 91.2  78.0 - 100.0 fL   MCH 32.4  26.0 - 34.0 pg   MCHC 35.5  30.0 - 36.0 g/dL   RDW 11.9  11.5 - 15.5 %   Platelets 214  150 - 400 K/uL   Neutrophils Relative % 65  43 - 77 %   Neutro Abs 5.6  1.7 - 7.7 K/uL   Lymphocytes Relative 19  12 - 46 %   Lymphs Abs 1.6  0.7 - 4.0 K/uL   Monocytes Relative 14 (*) 3 - 12 %   Monocytes Absolute 1.2 (*) 0.1 - 1.0 K/uL   Eosinophils Relative 2  0 - 5 %   Eosinophils Absolute 0.2  0.0 - 0.7 K/uL   Basophils Relative 0  0 - 1 %   Basophils Absolute 0.0  0.0 - 0.1 K/uL  PROTIME-INR     Status: None   Collection Time    08/16/13  8:42 PM      Result Value Ref Range   Prothrombin Time 13.7  11.6 - 15.2 seconds   INR 1.07  0.00 - 1.49  TYPE AND SCREEN      Status: None   Collection Time    08/16/13  8:42 PM      Result Value Ref Range   ABO/RH(D) B NEG     Antibody Screen NEG     Sample Expiration 08/19/2013    URINALYSIS, ROUTINE W REFLEX MICROSCOPIC     Status: None   Collection Time    08/17/13  1:05 AM      Result Value Ref Range   Color, Urine YELLOW  YELLOW   APPearance CLEAR  CLEAR   Specific Gravity, Urine 1.021  1.005 - 1.030   pH 6.5  5.0 - 8.0   Glucose, UA NEGATIVE  NEGATIVE mg/dL   Hgb urine dipstick NEGATIVE  NEGATIVE   Bilirubin Urine NEGATIVE  NEGATIVE   Ketones, ur NEGATIVE  NEGATIVE mg/dL   Protein, ur NEGATIVE  NEGATIVE mg/dL   Urobilinogen, UA 0.2  0.0 - 1.0 mg/dL   Nitrite NEGATIVE  NEGATIVE   Leukocytes, UA NEGATIVE  NEGATIVE   Comment: MICROSCOPIC NOT DONE ON URINES WITH NEGATIVE PROTEIN, BLOOD, LEUKOCYTES, NITRITE, OR GLUCOSE <1000 mg/dL.    Dg Chest 1 View  08/16/2013   CLINICAL DATA:  Hip injury.  EXAM: CHEST - 1 VIEW  COMPARISON:  03/22/2009  FINDINGS: Normal heart size and mediastinal contours. No acute infiltrate or edema. No effusion or pneumothorax. No acute osseous findings.  IMPRESSION: No active disease.   Electronically Signed   By: Jorje Guild M.D.   On: 08/16/2013 21:16   Dg Hip Complete Left  08/16/2013   CLINICAL DATA:  Fall, left hip pain  EXAM: LEFT HIP - COMPLETE 2+ VIEW  COMPARISON:  None.  FINDINGS: There is an acute left hip subcapital femoral neck fracture with slight displacement. Diffuse soft tissue swelling. Bones are osteopenic. Pelvis and right hip appear intact. No diastases.  IMPRESSION: Acute left hip subcapital femoral  neck fracture   Electronically Signed   By: Daryll Brod M.D.   On: 08/16/2013 21:18    Review of Systems  Constitutional: Negative.   HENT: Negative.   Eyes: Negative.   Respiratory: Negative.   Cardiovascular: Negative.   Gastrointestinal: Negative.   Genitourinary: Negative.   Musculoskeletal: Positive for falls, joint pain and myalgias.  Skin:  Negative.   Neurological: Negative.   Endo/Heme/Allergies: Negative.   Psychiatric/Behavioral: Negative.    Blood pressure 121/58, pulse 50, temperature 98.1 F (36.7 C), temperature source Oral, resp. rate 16, height 5' 7"  (1.702 m), weight 165 lb (74.844 kg), SpO2 95.00%. Physical Exam Physical Examination: General appearance - alert, well appearing, and in no distress Mental status - alert, oriented to person, place, and time Chest - clear to auscultation, no wheezes, rales or rhonchi, symmetric air entry Heart - normal rate, regular rhythm, normal S1, S2, no murmurs, rubs, clicks or gallops Abdomen - soft, nontender, nondistended, no masses or organomegaly Neurological - alert, oriented, normal speech, no focal findings or movement disorder noted LLE shortened with slight external rotation. Sensation and pulses intact left foot; motor intact left foot   Assessment/Plan: Left femoral neck fracture- Unfortunately he now has slight angulation of the femoral neck suggesting that the fracture started to displace yesterday as evidenced by his increase in pain. He would be at high risk of further displacement if we treat this with pinning as opposed to hemiarthroplasty which will avoid that risk of non-union, AVN or femoral head collapse. I discussed both surgical options with the patient and he elects to proceed with left hip hemiarthroplasty. Discussed procedure, risks, potential complications and rehab course and he elects to proceed. Will plan on surgery later this afternoon  [name] [name] 08/17/2013, 7:52 AM

## 2013-08-17 NOTE — Progress Notes (Signed)
Clinical Social Work Department CLINICAL SOCIAL WORK PLACEMENT NOTE 08/17/2013  Patient:  [name] [name], [name] [name]  Account Number:  [id number] Admit date:  08/16/2013  Clinical Social Worker:  Werner Lean, LCSW  Date/time:  08/17/2013 04:02 PM  Clinical Social Work is seeking post-discharge placement for this patient at the following level of care:      (*CSW will update this form in Epic as items are completed)   08/17/2013  Patient/family provided with [place] Department of Clinical Social Work's list of facilities offering this level of care within the geographic area requested by the patient (or if unable, by the patient's family).  08/17/2013  Patient/family informed of their freedom to choose among providers that offer the needed level of care, that participate in Medicare, Medicaid or managed care program needed by the patient, have an available bed and are willing to accept the patient.  08/17/2013  Patient/family informed of [facility]' ownership interest in [facility], as well as of the fact that they are under no obligation to receive care at this facility.  PASARR submitted to EDS on 08/17/2013 PASARR number received from EDS on 08/17/2013  FL2 transmitted to all facilities in geographic area requested by pt/family on  08/17/2013 FL2 transmitted to all facilities within larger geographic area on   Patient informed that his/her managed care company has contracts with or will negotiate with  certain facilities, including the following:     Patient/family informed of bed offers received:   Patient chooses bed at  Physician recommends and patient chooses bed at    Patient to be transferred to  on   Patient to be transferred to facility by   The following physician request were entered in Epic:   Additional Comments:  Werner Lean LCSW [phone]

## 2013-08-17 NOTE — Transfer of Care (Signed)
Immediate Anesthesia Transfer of Care Note  Patient: [name] [name]  Procedure(s) Performed: Procedure(s): ARTHROPLASTY BIPOLAR HIP (Left)  Patient Location: PACU  Anesthesia Type:General  Level of Consciousness: awake, alert , oriented and patient cooperative  Airway & Oxygen Therapy: Patient Spontanous Breathing and Patient connected to face mask oxygen  Post-op Assessment: Report given to PACU RN and Post -op Vital signs reviewed and stable  Post vital signs: Reviewed and stable  Complications: No apparent anesthesia complications

## 2013-08-18 ENCOUNTER — Encounter (HOSPITAL_COMMUNITY): Payer: Self-pay | Admitting: Orthopedic Surgery

## 2013-08-18 DIAGNOSIS — I495 Sick sinus syndrome: Secondary | ICD-10-CM

## 2013-08-18 LAB — BASIC METABOLIC PANEL
BUN: 19 mg/dL (ref 6–23)
CO2: 27 mEq/L (ref 19–32)
Calcium: 8.9 mg/dL (ref 8.4–10.5)
Chloride: 101 mEq/L (ref 96–112)
Creatinine, Ser: 0.83 mg/dL (ref 0.50–1.35)
GFR calc Af Amer: 90 mL/min — ABNORMAL LOW (ref >90)
GFR calc non Af Amer: 77 mL/min — ABNORMAL LOW (ref >90)
Glucose, Bld: 202 mg/dL — ABNORMAL HIGH (ref 70–99)
Potassium: 4.9 mEq/L (ref 3.7–5.3)
Sodium: 141 mEq/L (ref 137–147)

## 2013-08-18 LAB — URINE CULTURE: Colony Count: NO GROWTH

## 2013-08-18 LAB — CBC
HCT: 39.7 % (ref 39.0–52.0)
Hemoglobin: 13.6 g/dL (ref 13.0–17.0)
MCH: 31.9 pg (ref 26.0–34.0)
MCHC: 34.3 g/dL (ref 30.0–36.0)
MCV: 93 fL (ref 78.0–100.0)
Platelets: 194 10*3/uL (ref 150–400)
RBC: 4.27 MIL/uL (ref 4.22–5.81)
RDW: 12 % (ref 11.5–15.5)
WBC: 10.5 10*3/uL (ref 4.0–10.5)

## 2013-08-18 MED ORDER — LISINOPRIL 20 MG PO TABS
20.0000 mg | ORAL_TABLET | Freq: Every day | ORAL | Status: DC
  Administered 2013-08-18 – 2013-08-19 (×2): 20 mg via ORAL
  Filled 2013-08-18 (×3): qty 1

## 2013-08-18 MED ORDER — AMLODIPINE BESYLATE 10 MG PO TABS
10.0000 mg | ORAL_TABLET | Freq: Every day | ORAL | Status: DC
  Administered 2013-08-18 – 2013-08-19 (×2): 10 mg via ORAL
  Filled 2013-08-18 (×3): qty 1

## 2013-08-18 NOTE — Progress Notes (Signed)
   Subjective: 1 Day Post-Op Procedure(s) (LRB): ARTHROPLASTY BIPOLAR HIP (Left) Patient reports pain as mild and moderate.   Patient seen in rounds with [name]. Patient is well, but has had some minor complaints of pain in the hip, requiring pain medications We will start therapy today.  Plan is to go Skilled nursing facility after hospital stay.  Objective: Vital signs in last 24 hours: Temp:  [96.4 F (35.8 C)-98.6 F (37 C)] 97.4 F (36.3 C) (03/05 0435) Pulse Rate:  [51-61] 56 (03/05 0435) Resp:  [12-18] 16 (03/05 0435) BP: (126-180)/(51-73) 157/65 mmHg (03/05 0435) SpO2:  [88 %-100 %] 97 % (03/05 0435)  Intake/Output from previous day:  Intake/Output Summary (Last 24 hours) at 08/18/13 0845 Last data filed at 08/18/13 0500  Gross per 24 hour  Intake 2804.17 ml  Output   1520 ml  Net 1284.17 ml    Intake/Output this shift:    Labs:  Recent Labs  08/16/13 2042 08/18/13 0518  HGB 15.1 13.6    Recent Labs  08/16/13 2042 08/18/13 0518  WBC 8.6 10.5  RBC 4.66 4.27  HCT 42.5 39.7  PLT 214 194    Recent Labs  08/16/13 2042 08/18/13 0518  NA 139 141  K 3.7 4.9  CL 98 101  CO2 26 27  BUN 26* 19  CREATININE 1.18 0.83  GLUCOSE 105* 202*  CALCIUM 10.0 8.9    Recent Labs  08/16/13 2042  INR 1.07    EXAM General - Patient is Alert, Appropriate and Oriented Extremity - Neurovascular intact Sensation intact distally Dorsiflexion/Plantar flexion intact Dressing - dressing C/D/I Motor Function - intact, moving foot and toes well on exam.  Hemovac pulled without difficulty.  Past Medical History  Diagnosis Date  . Coronary artery disease     MODERATE  . Hyperlipidemia   . Hypertension   . Headache(784.0)   . Internal hemorrhoids 2009    Dr. Lajoyce Corners Colonoscopy  . History of colon polyps 2009    Dr. Orr/Colonoscopy -small cecal adenoma  . H. pylori infection     Hx of     Assessment/Plan: 1 Day Post-Op Procedure(s)  (LRB): ARTHROPLASTY BIPOLAR HIP (Left) Principal Problem:   Femur fracture, left Active Problems:   Coronary artery disease   Hyperlipidemia   Hypertension   Chronic diastolic heart failure  Estimated body mass index is 25.84 kg/(m^2) as calculated from the following:   Height as of this encounter: 5\' 7"  (1.702 m).   Weight as of this encounter: 74.844 kg (165 lb). Advance diet Up with therapy Discharge to SNF  DVT Prophylaxis - Lovenox for 10 days and then ASA therapy. Weight Bearing As Tolerated left Leg D/C Knee Immobilizer Hemovac Pulled Begin Therapy Hip Preacutions  [name], ALEXZANDREW 08/18/2013, 8:45 AM

## 2013-08-18 NOTE — Progress Notes (Signed)
[facility] PROGRESS NOTE  [name] [name] UVO:[id number] DOB: Oct 24, 1924 DOA: 08/16/2013 PCP: [name], MD  Assessment/Plan: 80 y/o male with PMH of HTN, HPL, BPH, CAD admitted with mechanical fall and hip fracture   1. Fall mechanical L hip fracture  -s/p L hip by ortho on 3/4; pend PT    2. CAD no active cardiopulmonary symptoms;  -echo (2013): LVEF 55%, normal wall motion;  -cont ASA, statin, BP control; hold to start BB due h/o bradycardia   3. HTN stable; resume lisinopril; hold diuretics   4. HPL on statin   Code Status: full Family Communication: d/w patient, friends at the bedside;  (indicate person spoken with, relationship, and if by phone, the number) Disposition Plan:SNF per ortho; likely in AM   Consultants:  ortho  Procedures:  Pend surgery  Antibiotics:  none (indicate start date, and stop date if known)  HPI/Subjective: alert  Objective: Filed Vitals:   08/18/13 1027  BP: 154/65  Pulse: 67  Temp: 98.2 F (36.8 C)  Resp: 16    Intake/Output Summary (Last 24 hours) at 08/18/13 1132 Last data filed at 08/18/13 0900  Gross per 24 hour  Intake   2535 ml  Output   1320 ml  Net   1215 ml   Filed Weights   08/17/13 0020  Weight: 74.844 kg (165 lb)    Exam:   General:  alert  Cardiovascular: s1,s2 rrr  Respiratory: CTA BL  Abdomen: soft, nt, nd   Musculoskeletal: no LE edema   Data Reviewed: Basic Metabolic Panel:  Recent Labs Lab 08/16/13 2042 08/18/13 0518  NA 139 141  K 3.7 4.9  CL 98 101  CO2 26 27  GLUCOSE 105* 202*  BUN 26* 19  CREATININE 1.18 0.83  CALCIUM 10.0 8.9   Liver Function Tests: No results found for this basename: AST, ALT, ALKPHOS, BILITOT, PROT, ALBUMIN,  in the last 168 hours No results found for this basename: LIPASE, AMYLASE,  in the last 168 hours No results found for this basename: AMMONIA,  in the last 168 hours CBC:  Recent Labs Lab 08/16/13 2042 08/18/13 0518   WBC 8.6 10.5  NEUTROABS 5.6  --   HGB 15.1 13.6  HCT 42.5 39.7  MCV 91.2 93.0  PLT 214 194   Cardiac Enzymes: No results found for this basename: CKTOTAL, CKMB, CKMBINDEX, TROPONINI,  in the last 168 hours BNP (last 3 results) No results found for this basename: PROBNP,  in the last 8760 hours CBG: No results found for this basename: GLUCAP,  in the last 168 hours  Recent Results (from the past 240 hour(s))  URINE CULTURE     Status: None   Collection Time    08/17/13  1:05 AM      Result Value Ref Range Status   Specimen Description URINE, RANDOM   Final   Special Requests NONE   Final   Culture  Setup Time     Final   Value: 08/17/2013 05:29     Performed at [facility]     Final   Value: NO GROWTH     Performed at [facility]   Culture     Final   Value: NFG     Performed at [facility]   Report Status 08/18/2013 FINAL   Final  SURGICAL PCR SCREEN     Status: None   Collection Time    08/17/13  9:11 AM  Result Value Ref Range Status   MRSA, PCR NEGATIVE  NEGATIVE Final   Staphylococcus aureus NEGATIVE  NEGATIVE Final   Comment:            The Xpert SA Assay (FDA     approved for NASAL specimens     in patients over 18 years of age),     is one component of     a comprehensive surveillance     program.  Test performance has     been validated by [employer] for patients greater     than or equal to 31 year old.     It is not intended     to diagnose infection nor to     guide or monitor treatment.     Studies: Dg Chest 1 View  08/16/2013   CLINICAL DATA:  Hip injury.  EXAM: CHEST - 1 VIEW  COMPARISON:  03/22/2009  FINDINGS: Normal heart size and mediastinal contours. No acute infiltrate or edema. No effusion or pneumothorax. No acute osseous findings.  IMPRESSION: No active disease.   Electronically Signed   By: Jorje Guild M.D.   On: 08/16/2013 21:16   Dg Hip Complete Left  08/16/2013   CLINICAL DATA:   Fall, left hip pain  EXAM: LEFT HIP - COMPLETE 2+ VIEW  COMPARISON:  None.  FINDINGS: There is an acute left hip subcapital femoral neck fracture with slight displacement. Diffuse soft tissue swelling. Bones are osteopenic. Pelvis and right hip appear intact. No diastases.  IMPRESSION: Acute left hip subcapital femoral neck fracture   Electronically Signed   By: Daryll Brod M.D.   On: 08/16/2013 21:18   Dg Pelvis Portable  08/17/2013   CLINICAL DATA:  Left hip replacement.  EXAM: PORTABLE PELVIS 1-2 VIEWS  COMPARISON:  Plain films left hip 08/16/2013.  FINDINGS: The patient has a new bipolar left hip hemiarthroplasty. Surgical drain and gas in the soft tissues from surgery noted. No acute abnormality.  IMPRESSION: Left hip replacement without acute abnormality.   Electronically Signed   By: Inge Rise M.D.   On: 08/17/2013 18:47    Scheduled Meds: . aspirin  162 mg Oral Daily  . atorvastatin  20 mg Oral Once per day on Mon Thu  . cholecalciferol  1,000 Units Oral Daily  . docusate sodium  100 mg Oral BID  . enoxaparin (LOVENOX) injection  40 mg Subcutaneous Q24H  . finasteride  5 mg Oral Daily  . multivitamin with minerals  1 tablet Oral Daily  . senna  1 tablet Oral Daily   Continuous Infusions: . sodium chloride 75 mL/hr at 08/17/13 2044    Principal Problem:   Femur fracture, left Active Problems:   Coronary artery disease   Hyperlipidemia   Hypertension   Chronic diastolic heart failure    Time spent: >35 minutes     Kinnie Feil  [facility] Pager [phone]. If 7PM-7AM, please contact night-coverage at [email or web address], password [facility] 08/18/2013, 11:32 AM  LOS: 2 days

## 2013-08-18 NOTE — Progress Notes (Signed)
OT Cancellation Note  Patient Details Name: [name] [name] MRN: [id number] DOB: [birth year]   Cancelled Treatment:    Reason Eval/Treat Not Completed: Other (comment) Defer OT eval to SNF.   Jules Schick [phone] 08/18/2013, 1:15 PM

## 2013-08-18 NOTE — Progress Notes (Signed)
08/18/13 1300  PT Visit Information  Last PT Received On 08/18/13  Assistance Needed +1  History of Present Illness 80 year old fully  functional male with history of coronary artery disease (followed by Dr. Acie Fredrickson), hypertension and hyperlipidemia who slipped on icy surface at home and landed on his left hip. Initially patient felt fine but from the second day he had pain over his left hip with painful gait. He subsequently had x-ray of the hip done which did not show any fracture. However patient had persistent pain in his left hip and today it was quite severe. He went to see his orthopedist and had an MRI done and including imaging which showed left subcapital hip fracture and was sent to the ED.   PT Time Calculation  PT Start Time 1313  PT Stop Time 1338  PT Time Calculation (min) 25 min  Subjective Data  Patient Stated Goal back to active lifestyle  Precautions  Precautions Fall;Posterior Hip  Restrictions  Other Position/Activity Restrictions WBAT  Cognition  Arousal/Alertness Awake/alert  Behavior During Therapy WFL for tasks assessed/performed  Overall Cognitive Status Impaired/Different from baseline  Area of Impairment Following commands;Safety/judgement  Memory Decreased recall of precautions;Decreased short-term memory  Following Commands Follows one step commands with increased time  Safety/Judgement Decreased awareness of safety;Decreased awareness of deficits  General Comments pt talks excessively  and requires frequent redirection, cues to stay on task  Transfers  Overall transfer level Needs assistance  Equipment used Rolling walker (2 wheeled)  Transfers Sit to/from Stand  Sit to Stand Min guard  General transfer comment verbal and tactile cues for hip precautions, LLE position, hip precautions   Ambulation/Gait  Ambulation/Gait assistance Min guard  Ambulation Distance (Feet) 84 Feet  Assistive device Rolling walker (2 wheeled)  Gait Pattern/deviations Step-to  pattern  Gait velocity decr  General Gait Details cues for sequence, RW safety, and  to stay on task  General Exercises - Lower Extremity  Ankle Circles/Pumps AROM;Both;10 reps  Quad Sets AROM;Both;5 reps  PT - End of Session  Equipment Utilized During Treatment Gait belt  Activity Tolerance Patient tolerated treatment well  Patient left in chair;with call bell/phone within reach;with family/visitor present  Nurse Communication Mobility status  PT - Assessment/Plan  PT Plan Current plan remains appropriate  PT Frequency Min 3X/week  Follow Up Recommendations SNF  PT equipment None recommended by PT  PT Goal Progression  Progress towards PT goals Progressing toward goals  Acute Rehab PT Goals  PT Goal Formulation With patient  Time For Goal Achievement 08/25/13  Potential to Achieve Goals Good  PT General Charges  $$ ACUTE PT VISIT 1 Procedure  PT Treatments  $Gait Training 23-37 mins

## 2013-08-18 NOTE — Progress Notes (Signed)
UR completed. Audriella Blakeley RN CCM Case Mgmt phone [phone] 

## 2013-08-18 NOTE — Evaluation (Signed)
Physical Therapy Evaluation Patient Details Name: [name] [name] MRN: [id number] DOB: 09/21/1924 Today's Date: 08/18/2013 Time: 7829-5621 PT Time Calculation (min): 34 min  PT Assessment / Plan / Recommendation History of Present Illness  80 year old fully  functional male with history of coronary artery disease (followed by Dr. Acie Fredrickson), hypertension and hyperlipidemia who slipped on icy surface at home and landed on his left hip. Initially patient felt fine but from the second day he had pain over his left hip with painful gait. He subsequently had x-ray of the hip done which did not show any fracture. However patient had persistent pain in his left hip and today it was quite severe. He went to see his orthopedist and had an MRI done and including imaging which showed left subcapital hip fracture and was sent to the ED.   Clinical Impression  Pt will benefit from PT to address deficits below; planning for SNF post acute, dtr hopeful for [facility]    PT Assessment  Patient needs continued PT services    Follow Up Recommendations  SNF    Does the patient have the potential to tolerate intense rehabilitation      Barriers to Discharge        Equipment Recommendations  None recommended by PT    Recommendations for Other Services     Frequency Min 3X/week    Precautions / Restrictions Precautions Precautions: Fall;Posterior Hip Restrictions Weight Bearing Restrictions: No Other Position/Activity Restrictions: WBAT   Pertinent Vitals/Pain Min c/o pain      Mobility  Bed Mobility Overal bed mobility: Needs Assistance Bed Mobility: Supine to Sit Supine to sit: Min assist General bed mobility comments: constant multi-modal cues to maintain hip precautions;  Transfers Overall transfer level: Needs assistance Equipment used: Rolling walker (2 wheeled) Transfers: Sit to/from Stand Sit to Stand: Min assist General transfer comment: verbal and tactile cues for hip  precautions, LLE position, hip precautions  Ambulation/Gait Ambulation/Gait assistance: Min assist Ambulation Distance (Feet): 90 Feet Assistive device: Rolling walker (2 wheeled) Gait Pattern/deviations: Step-to pattern General Gait Details: cues for sequence, RW safety, and  to stay on task    Exercises     PT Diagnosis: Difficulty walking  PT Problem List: Decreased strength;Decreased range of motion;Decreased activity tolerance;Decreased balance;Decreased mobility;Decreased knowledge of precautions;Decreased safety awareness;Decreased knowledge of use of DME PT Treatment Interventions: DME instruction;Gait training;Functional mobility training;Therapeutic activities;Therapeutic exercise;Patient/family education     PT Goals(Current goals can be found in the care plan section) Acute Rehab PT Goals Patient Stated Goal: back to active lifestyle PT Goal Formulation: With patient Time For Goal Achievement: 08/25/13 Potential to Achieve Goals: Good  Visit Information  Last PT Received On: 08/18/13 Assistance Needed:  (+2 for safety) History of Present Illness: 80 year old fully  functional male with history of coronary artery disease (followed by Dr. Acie Fredrickson), hypertension and hyperlipidemia who slipped on icy surface at home and landed on his left hip. Initially patient felt fine but from the second day he had pain over his left hip with painful gait. He subsequently had x-ray of the hip done which did not show any fracture. However patient had persistent pain in his left hip and today it was quite severe. He went to see his orthopedist and had an MRI done and including imaging which showed left subcapital hip fracture and was sent to the ED.        Prior Functioning  Home Living Family/patient expects to be discharged to:: Skilled nursing facility Living Arrangements:  Alone Additional Comments: dtr does report that they have noticed some subtle cognitive issues since pt wife passed  away in Dec 2014 Prior Function Level of Independence: Independent Communication Communication: No difficulties    Cognition  Cognition Arousal/Alertness: Awake/alert Behavior During Therapy: WFL for tasks assessed/performed Overall Cognitive Status: Impaired/Different from baseline Area of Impairment: Following commands;Safety/judgement Following Commands: Follows one step commands inconsistently Safety/Judgement: Decreased awareness of safety;Decreased awareness of deficits General Comments: pt talks excessively  and requires frequent redirection, cues to stay on task    Extremity/Trunk Assessment Upper Extremity Assessment Upper Extremity Assessment: Overall WFL for tasks assessed Lower Extremity Assessment Lower Extremity Assessment: LLE deficits/detail LLE Deficits / Details: at least 3/5; AAROM WFL within hip precautions    Balance    End of Session PT - End of Session Equipment Utilized During Treatment: Gait belt Activity Tolerance: Patient tolerated treatment well Patient left: in chair;with family/visitor present;with call bell/phone within reach Nurse Communication: Mobility status  GP     [facility] 08/18/2013, 12:19 PM

## 2013-08-19 ENCOUNTER — Encounter: Payer: Self-pay | Admitting: *Deleted

## 2013-08-19 DIAGNOSIS — I5032 Chronic diastolic (congestive) heart failure: Secondary | ICD-10-CM

## 2013-08-19 MED ORDER — TRAMADOL HCL 50 MG PO TABS: 50.0000 mg | ORAL_TABLET | Freq: Four times a day (QID) | ORAL | Status: DC | PRN

## 2013-08-19 MED ORDER — ASPIRIN EC 81 MG PO TBEC: 81.0000 mg | DELAYED_RELEASE_TABLET | Freq: Every day | ORAL | Status: DC

## 2013-08-19 MED ORDER — DSS 100 MG PO CAPS: 100.0000 mg | ORAL_CAPSULE | Freq: Two times a day (BID) | ORAL | Status: DC

## 2013-08-19 MED ORDER — ENOXAPARIN SODIUM 40 MG/0.4ML ~~LOC~~ SOLN: 40.0000 mg | SUBCUTANEOUS | Status: DC

## 2013-08-19 MED ORDER — SENNA 8.6 MG PO TABS: 1.0000 | ORAL_TABLET | Freq: Every day | ORAL | Status: DC

## 2013-08-19 MED ORDER — BISACODYL 10 MG RE SUPP: 10.0000 mg | Freq: Every day | RECTAL | Status: DC | PRN

## 2013-08-19 MED ORDER — ALUM & MAG HYDROXIDE-SIMETH 200-200-20 MG/5ML PO SUSP
30.0000 mL | Freq: Four times a day (QID) | ORAL | Status: DC | PRN
  Administered 2013-08-19: 30 mL via ORAL
  Filled 2013-08-19: qty 30

## 2013-08-19 MED ORDER — HYDROCHLOROTHIAZIDE 25 MG PO TABS
25.0000 mg | ORAL_TABLET | Freq: Every day | ORAL | Status: DC
  Filled 2013-08-19 (×2): qty 1

## 2013-08-19 MED ORDER — PANTOPRAZOLE SODIUM 40 MG PO TBEC
40.0000 mg | DELAYED_RELEASE_TABLET | Freq: Every day | ORAL | Status: DC
  Administered 2013-08-19: 40 mg via ORAL
  Filled 2013-08-19 (×2): qty 1

## 2013-08-19 MED ORDER — HYDROCODONE-ACETAMINOPHEN 5-325 MG PO TABS: 1.0000 | ORAL_TABLET | ORAL | Status: DC | PRN

## 2013-08-19 NOTE — Progress Notes (Signed)
Writer reviewing orders and realized Troponin which was ordered at 1900 3/6 had not been done. Reordered. Will make MD aware.

## 2013-08-19 NOTE — Progress Notes (Addendum)
CSW assisting with d/c planning. Pt has a ST Rehab bed at [facility] for SAT if stable for d/c. CSW will send tentative d/c summary to SNF once available. Weekend CSW will assist with d/c planning to SNF. Pt / family are in agreement with d/c plan.  Werner Lean LCSW [phone]   [id number]  D/C Summary has been sent to [facility] for possible SAT d/c.  Werner Lean LCSW [phone]

## 2013-08-19 NOTE — Progress Notes (Signed)
Patient began complaining of what he called "indigestion" around 1700, maalox and protonix given around 1800. At 1900, pain still unrelieved.  MD called, and he ordered and EKG, and troponin's x1   On call MD informed of EKG results.   Night RN to call night MD with further concerns/questions.

## 2013-08-19 NOTE — Progress Notes (Signed)
08/19/13 1500  PT Visit Information  Last PT Received On 08/19/13  Assistance Needed +1  History of Present Illness 80 year old fully  functional male with history of coronary artery disease (followed by Dr. Acie Fredrickson), hypertension and hyperlipidemia who slipped on icy surface at home and landed on his left hip. Initially patient felt fine but from the second day he had pain over his left hip with painful gait. He subsequently had x-ray of the hip done which did not show any fracture. However patient had persistent pain in his left hip and today it was quite severe. He went to see his orthopedist and had an MRI done and including imaging which showed left subcapital hip fracture and was sent to the ED.   PT Time Calculation  PT Start Time 1525  PT Stop Time 1548  PT Time Calculation (min) 23 min  Subjective Data  Patient Stated Goal back to active lifestyle  Precautions  Precautions Fall;Posterior Hip  Restrictions  Other Position/Activity Restrictions WBAT  Cognition  Arousal/Alertness Awake/alert  Behavior During Therapy WFL for tasks assessed/performed  Overall Cognitive Status Impaired/Different from baseline  Area of Impairment Following commands;Safety/judgement  Memory Decreased recall of precautions;Decreased short-term memory  Following Commands Follows one step commands with increased time  Safety/Judgement Decreased awareness of safety;Decreased awareness of deficits  General Comments pt following commands more consistently this pm  Bed Mobility  Overal bed mobility Needs Assistance  Bed Mobility Supine to Sit;Sit to Supine  Supine to sit Min assist  Sit to supine Min assist  General bed mobility comments cues for technique  Transfers  Overall transfer level Needs assistance  Equipment used Rolling walker (2 wheeled)  Transfers Sit to/from Stand  Sit to Stand Min guard  General transfer comment verbal and tactile cues for hip precautions, LLE position, hip precautions    Ambulation/Gait  Ambulation/Gait assistance Min guard  Ambulation Distance (Feet) 102 Feet  Assistive device Rolling walker (2 wheeled)  Gait Pattern/deviations Step-to pattern;Narrow base of support;Antalgic  Gait velocity decr  General Gait Details near constant cues for sequence, RW safety, and  to stay on task  PT - End of Session  Equipment Utilized During Treatment Gait belt  Activity Tolerance Patient tolerated treatment well  Patient left in bed;with call bell/phone within reach;with bed alarm set;with family/visitor present  Nurse Communication Mobility status  PT - Assessment/Plan  PT Plan Current plan remains appropriate  PT Frequency Min 3X/week  Follow Up Recommendations SNF  PT equipment None recommended by PT  PT Goal Progression  Progress towards PT goals Progressing toward goals  Acute Rehab PT Goals  Time For Goal Achievement 08/25/13  Potential to Achieve Goals Good  PT General Charges  $$ ACUTE PT VISIT 1 Procedure  PT Treatments  $Therapeutic Activity 8-22 mins

## 2013-08-19 NOTE — Progress Notes (Signed)
[facility] PROGRESS NOTE  [name] [name] JSE:[id number] DOB: 12-29-1924 DOA: 08/16/2013 PCP: [name], MD  Assessment/Plan: 80 y/o male with PMH of HTN, HPL, BPH, CAD admitted with mechanical fall and hip fracture   1. Fall mechanical L hip fracture  -s/p L hip by ortho on 3/4; SNF likely on 3/7 per ortho     2. CAD no active cardiopulmonary symptoms;  -echo (2013): LVEF 55%, normal wall motion;  -cont ASA, statin, BP control; hold to start BB due h/o bradycardia   3. HTN not at goal; resume lisinopril; resume diuretics   4. HPL on statin   Code Status: full Family Communication: d/w patient, friends at the bedside;  (indicate person spoken with, relationship, and if by phone, the number) Disposition Plan:SNF per ortho; likely 3/7   Consultants:  ortho  Procedures:  Pend surgery  Antibiotics:  none (indicate start date, and stop date if known)  HPI/Subjective: alert  Objective: Filed Vitals:   08/19/13 0444  BP: 160/66  Pulse: 64  Temp: 97.7 F (36.5 C)  Resp: 18    Intake/Output Summary (Last 24 hours) at 08/19/13 0957 Last data filed at 08/19/13 0901  Gross per 24 hour  Intake 2260.25 ml  Output   1250 ml  Net 1010.25 ml   Filed Weights   08/17/13 0020  Weight: 74.844 kg (165 lb)    Exam:   General:  alert  Cardiovascular: s1,s2 rrr  Respiratory: CTA BL  Abdomen: soft, nt, nd   Musculoskeletal: no LE edema   Data Reviewed: Basic Metabolic Panel:  Recent Labs Lab 08/16/13 2042 08/18/13 0518  NA 139 141  K 3.7 4.9  CL 98 101  CO2 26 27  GLUCOSE 105* 202*  BUN 26* 19  CREATININE 1.18 0.83  CALCIUM 10.0 8.9   Liver Function Tests: No results found for this basename: AST, ALT, ALKPHOS, BILITOT, PROT, ALBUMIN,  in the last 168 hours No results found for this basename: LIPASE, AMYLASE,  in the last 168 hours No results found for this basename: AMMONIA,  in the last 168 hours CBC:  Recent Labs Lab  08/16/13 2042 08/18/13 0518  WBC 8.6 10.5  NEUTROABS 5.6  --   HGB 15.1 13.6  HCT 42.5 39.7  MCV 91.2 93.0  PLT 214 194   Cardiac Enzymes: No results found for this basename: CKTOTAL, CKMB, CKMBINDEX, TROPONINI,  in the last 168 hours BNP (last 3 results) No results found for this basename: PROBNP,  in the last 8760 hours CBG: No results found for this basename: GLUCAP,  in the last 168 hours  Recent Results (from the past 240 hour(s))  URINE CULTURE     Status: None   Collection Time    08/17/13  1:05 AM      Result Value Ref Range Status   Specimen Description URINE, RANDOM   Final   Special Requests NONE   Final   Culture  Setup Time     Final   Value: 08/17/2013 05:29     Performed at [facility]     Final   Value: NO GROWTH     Performed at [facility]   Culture     Final   Value: NFG     Performed at [facility]   Report Status 08/18/2013 FINAL   Final  SURGICAL PCR SCREEN     Status: None   Collection Time    08/17/13  9:11  AM      Result Value Ref Range Status   MRSA, PCR NEGATIVE  NEGATIVE Final   Staphylococcus aureus NEGATIVE  NEGATIVE Final   Comment:            The Xpert SA Assay (FDA     approved for NASAL specimens     in patients over 67 years of age),     is one component of     a comprehensive surveillance     program.  Test performance has     been validated by [employer] for patients greater     than or equal to 86 year old.     It is not intended     to diagnose infection nor to     guide or monitor treatment.     Studies: Dg Pelvis Portable  08/17/2013   CLINICAL DATA:  Left hip replacement.  EXAM: PORTABLE PELVIS 1-2 VIEWS  COMPARISON:  Plain films left hip 08/16/2013.  FINDINGS: The patient has a new bipolar left hip hemiarthroplasty. Surgical drain and gas in the soft tissues from surgery noted. No acute abnormality.  IMPRESSION: Left hip replacement without acute abnormality.    Electronically Signed   By: Inge Rise M.D.   On: 08/17/2013 18:47    Scheduled Meds: . amLODipine  10 mg Oral Daily  . aspirin  162 mg Oral Daily  . atorvastatin  20 mg Oral Once per day on Mon Thu  . cholecalciferol  1,000 Units Oral Daily  . docusate sodium  100 mg Oral BID  . enoxaparin (LOVENOX) injection  40 mg Subcutaneous Q24H  . finasteride  5 mg Oral Daily  . lisinopril  20 mg Oral Daily  . multivitamin with minerals  1 tablet Oral Daily  . senna  1 tablet Oral Daily   Continuous Infusions:    Principal Problem:   Femur fracture, left Active Problems:   Coronary artery disease   Hyperlipidemia   Hypertension   Chronic diastolic heart failure    Time spent: >35 minutes     Kinnie Feil  [facility] Pager [phone]. If 7PM-7AM, please contact night-coverage at [email or web address], password [facility] 08/19/2013, 9:57 AM  LOS: 3 days

## 2013-08-19 NOTE — Discharge Planning (Signed)
Physician Discharge Summary  [name] [name] HUT:[id number] DOB: 1924/06/28 DOA: 08/16/2013  PCP: [name] Pel, MD  Admit date: 08/16/2013 Discharge date: 08/19/2013  Time spent: >35 minutes  Recommendations for Outpatient Follow-up:  F/u with orthopedics in [name] in 2 weeks. F/u with PCP in 1-2 weeks  Discharge Diagnoses:  Principal Problem:   Femur fracture, left Active Problems:   Coronary artery disease   Hyperlipidemia   Hypertension   Chronic diastolic heart failure   Discharge Condition: stable   Diet recommendation: heart health y  [facility] Weights   08/17/13 0020  Weight: 74.844 kg (165 lb)    History of present illness:   80 y/o male with PMH of HTN, HPL, BPH, CAD admitted with mechanical fall and hip fracture   Hospital Course:  1. Fall mechanical L hip fracture  -s/p L hip by ortho on 3/4; SNF likely on 3/7 per ortho -f/u with ortho in 2 weeks   2. CAD no active cardiopulmonary symptoms;  -echo (2013): LVEF 55%, normal wall motion;  -cont ASA, statin, BP control; 3. HTN resume lisinopril; resume diuretics, titrate as tolerated  4. HPL on statin    Plan to d/c on 3/7 SNF  Procedures:  L hip (i.e. Studies not automatically included, echos, thoracentesis, etc; not x-rays)  Consultations:  ortho  Discharge Exam: Filed Vitals:   08/19/13 0900  BP:   Pulse: 68  Temp:   Resp:     General: alert Cardiovascular: s1,s2 rrr Respiratory: CTA BL  Discharge Instructions     Medication List    STOP taking these medications       aspirin 325 MG tablet  Replaced by:  aspirin EC 81 MG tablet      TAKE these medications       amLODipine 10 MG tablet  Commonly known as:  NORVASC  TAKE ONE TABLET BY MOUTH ONCE DAILY.  PT NEEDS TO SCHEDULE APPOINTMENT FOR FURTHER REFILLS.*     aspirin EC 81 MG tablet  Take 1 tablet (81 mg total) by mouth daily.     atorvastatin 20 MG tablet  Commonly known as:  LIPITOR  Take 20 mg by mouth. 2  Times a Week     bisacodyl 10 MG suppository  Commonly known as:  DULCOLAX  Place 1 suppository (10 mg total) rectally daily as needed for moderate constipation.     CoQ10 100 MG Caps  Take by mouth daily.     DSS 100 MG Caps  Take 100 mg by mouth 2 (two) times daily.     enoxaparin 40 MG/0.4ML injection  Commonly known as:  LOVENOX  Inject 0.4 mLs (40 mg total) into the skin daily.     finasteride 5 MG tablet  Commonly known as:  PROSCAR  Take 5 mg by mouth daily.     furosemide 20 MG tablet  Commonly known as:  LASIX  Take 40 mg by mouth as needed for fluid.     HYDROcodone-acetaminophen 5-325 MG per tablet  Commonly known as:  NORCO/VICODIN  Take 1-2 tablets by mouth every 4 (four) hours as needed for moderate pain.     KRILL OIL PO  Take 1 capsule by mouth daily.     lisinopril-hydrochlorothiazide 20-25 MG per tablet  Commonly known as:  PRINZIDE,ZESTORETIC  Take 1 tablet by mouth daily.     multivitamin tablet  Take 1 tablet by mouth daily.     NIACIN PO  Take by mouth. 5 days week  senna 8.6 MG Tabs tablet  Commonly known as:  SENOKOT  Take 1 tablet (8.6 mg total) by mouth daily.     traMADol 50 MG tablet  Commonly known as:  ULTRAM  Take 1 tablet (50 mg total) by mouth every 6 (six) hours as needed.     Vitamin D3 1000 UNITS Caps  Take by mouth daily.     ZINC PO  Take by mouth 3 (three) times a week. Monday, Wednesday, and Friday.       No Known Allergies    The results of significant diagnostics from this hospitalization (including imaging, microbiology, ancillary and laboratory) are listed below for reference.    Significant Diagnostic Studies: Dg Chest 1 View  08/16/2013   CLINICAL DATA:  Hip injury.  EXAM: CHEST - 1 VIEW  COMPARISON:  03/22/2009  FINDINGS: Normal heart size and mediastinal contours. No acute infiltrate or edema. No effusion or pneumothorax. No acute osseous findings.  IMPRESSION: No active disease.   Electronically Signed    By: Jorje Guild M.D.   On: 08/16/2013 21:16   Dg Hip Complete Left  08/16/2013   CLINICAL DATA:  Fall, left hip pain  EXAM: LEFT HIP - COMPLETE 2+ VIEW  COMPARISON:  None.  FINDINGS: There is an acute left hip subcapital femoral neck fracture with slight displacement. Diffuse soft tissue swelling. Bones are osteopenic. Pelvis and right hip appear intact. No diastases.  IMPRESSION: Acute left hip subcapital femoral neck fracture   Electronically Signed   By: Daryll Brod M.D.   On: 08/16/2013 21:18   Dg Pelvis Portable  08/17/2013   CLINICAL DATA:  Left hip replacement.  EXAM: PORTABLE PELVIS 1-2 VIEWS  COMPARISON:  Plain films left hip 08/16/2013.  FINDINGS: The patient has a new bipolar left hip hemiarthroplasty. Surgical drain and gas in the soft tissues from surgery noted. No acute abnormality.  IMPRESSION: Left hip replacement without acute abnormality.   Electronically Signed   By: Inge Rise M.D.   On: 08/17/2013 18:47    Microbiology: Recent Results (from the past 240 hour(s))  URINE CULTURE     Status: None   Collection Time    08/17/13  1:05 AM      Result Value Ref Range Status   Specimen Description URINE, RANDOM   Final   Special Requests NONE   Final   Culture  Setup Time     Final   Value: 08/17/2013 05:29     Performed at [facility]     Final   Value: NO GROWTH     Performed at [facility]   Culture     Final   Value: NFG     Performed at [facility]   Report Status 08/18/2013 FINAL   Final  SURGICAL PCR SCREEN     Status: None   Collection Time    08/17/13  9:11 AM      Result Value Ref Range Status   MRSA, PCR NEGATIVE  NEGATIVE Final   Staphylococcus aureus NEGATIVE  NEGATIVE Final   Comment:            The Xpert SA Assay (FDA     approved for NASAL specimens     in patients over 61 years of age),     is one component of     a comprehensive surveillance     program.  Test performance has     been validated  by [facility] for patients greater     than or equal to 76 year old.     It is not intended     to diagnose infection nor to     guide or monitor treatment.     Labs: Basic Metabolic Panel:  Recent Labs Lab 08/16/13 2042 08/18/13 0518  NA 139 141  K 3.7 4.9  CL 98 101  CO2 26 27  GLUCOSE 105* 202*  BUN 26* 19  CREATININE 1.18 0.83  CALCIUM 10.0 8.9   Liver Function Tests: No results found for this basename: AST, ALT, ALKPHOS, BILITOT, PROT, ALBUMIN,  in the last 168 hours No results found for this basename: LIPASE, AMYLASE,  in the last 168 hours No results found for this basename: AMMONIA,  in the last 168 hours CBC:  Recent Labs Lab 08/16/13 2042 08/18/13 0518  WBC 8.6 10.5  NEUTROABS 5.6  --   HGB 15.1 13.6  HCT 42.5 39.7  MCV 91.2 93.0  PLT 214 194   Cardiac Enzymes: No results found for this basename: CKTOTAL, CKMB, CKMBINDEX, TROPONINI,  in the last 168 hours BNP: BNP (last 3 results) No results found for this basename: PROBNP,  in the last 8760 hours CBG: No results found for this basename: GLUCAP,  in the last 168 hours     Signed:  Kinnie Feil  [facility] 08/19/2013, 1:49 PM

## 2013-08-19 NOTE — Progress Notes (Signed)
Physical Therapy Treatment Patient Details Name: [name] [name] MRN: [id number] DOB: Oct 01, 1924 Today's Date: 08/19/2013 Time: 6270-3500 PT Time Calculation (min): 42 min  PT Assessment / Plan / Recommendation  History of Present Illness 80 year old fully  functional male with history of coronary artery disease (followed by Dr. Acie Fredrickson), hypertension and hyperlipidemia who slipped on icy surface at home and landed on his left hip. Initially patient felt fine but from the second day he had pain over his left hip with painful gait. He subsequently had x-ray of the hip done which did not show any fracture. However patient had persistent pain in his left hip and today it was quite severe. He went to see his orthopedist and had an MRI done and including imaging which showed left subcapital hip fracture and was sent to the ED.    PT Comments   Pt will benefit from PT  At SNF level, will follow  Follow Up Recommendations  SNF     Does the patient have the potential to tolerate intense rehabilitation     Barriers to Discharge        Equipment Recommendations  None recommended by PT    Recommendations for Other Services    Frequency Min 3X/week   Progress towards PT Goals Progress towards PT goals: Progressing toward goals  Plan Current plan remains appropriate    Precautions / Restrictions Precautions Precautions: Fall;Posterior Hip Restrictions Other Position/Activity Restrictions: WBAT   Pertinent Vitals/Pain Pain controlled    Mobility  Bed Mobility Overal bed mobility: Needs Assistance Bed Mobility: Sit to Supine Sit to supine: Min assist General bed mobility comments: constant multi-modal cues to maintain hip precautions;  Transfers Overall transfer level: Needs assistance Equipment used: Rolling walker (2 wheeled) Transfers: Sit to/from [facility] Sit to Stand: Min guard Stand pivot transfers: Min assist General transfer comment: verbal and tactile  cues for hip precautions, LLE position, hip precautions  Ambulation/Gait Ambulation/Gait assistance: Min assist Ambulation Distance (Feet): 96 Feet Assistive device: Rolling walker (2 wheeled) Gait Pattern/deviations: Step-to pattern;Antalgic Gait velocity: decr General Gait Details: cues for sequence, RW safety, and  to stay on task    Exercises General Exercises - Lower Extremity Ankle Circles/Pumps: AROM;Both;10 reps Quad Sets: AROM;Both;10 reps Heel Slides: Left;AROM;10 reps;AAROM Hip ABduction/ADduction: AAROM;Left;10 reps   PT Diagnosis:    PT Problem List:   PT Treatment Interventions:     PT Goals (current goals can now be found in the care plan section) Acute Rehab PT Goals Patient Stated Goal: back to active lifestyle PT Goal Formulation: With patient Time For Goal Achievement: 08/25/13 Potential to Achieve Goals: Good  Visit Information  Last PT Received On: 08/19/13 Assistance Needed: +1 History of Present Illness: 80 year old fully  functional male with history of coronary artery disease (followed by Dr. Acie Fredrickson), hypertension and hyperlipidemia who slipped on icy surface at home and landed on his left hip. Initially patient felt fine but from the second day he had pain over his left hip with painful gait. He subsequently had x-ray of the hip done which did not show any fracture. However patient had persistent pain in his left hip and today it was quite severe. He went to see his orthopedist and had an MRI done and including imaging which showed left subcapital hip fracture and was sent to the ED.     Subjective Data  Patient Stated Goal: back to active lifestyle   Cognition  Cognition Arousal/Alertness: Awake/alert Behavior During Therapy: WFL for tasks assessed/performed  Overall Cognitive Status: Impaired/Different from baseline Area of Impairment: Following commands;Safety/judgement Memory: Decreased recall of precautions;Decreased short-term memory Following  Commands: Follows one step commands with increased time Safety/Judgement: Decreased awareness of safety;Decreased awareness of deficits General Comments: pt talks excessively  and requires frequent redirection, cues to stay on task    Balance     End of Session PT - End of Session Equipment Utilized During Treatment: Gait belt Activity Tolerance: Patient tolerated treatment well Patient left: in chair;with call bell/phone within reach;with family/visitor present Nurse Communication: Mobility status   GP     [facility] 08/19/2013, 10:36 AM

## 2013-08-19 NOTE — Progress Notes (Signed)
   Subjective: 2 Days Post-Op Procedure(s) (LRB): ARTHROPLASTY BIPOLAR HIP (Left) Patient reports pain as mild and moderate.   Patient seen in rounds with [name]. Tough night.  Discussed using the pain meds. Patient is well, but has had some minor complaints of pain in the hip, requiring pain medications Plan is to go Skilled nursing facility after hospital stay.  Objective: Vital signs in last 24 hours: Temp:  [97.7 F (36.5 C)-98.6 F (37 C)] 97.7 F (36.5 C) (03/06 0444) Pulse Rate:  [64-72] 64 (03/06 0444) Resp:  [16-19] 18 (03/06 0444) BP: (146-168)/(65-70) 160/66 mmHg (03/06 0444) SpO2:  [95 %-99 %] 96 % (03/06 0444)  Intake/Output from previous day:  Intake/Output Summary (Last 24 hours) at 08/19/13 0900 Last data filed at 08/19/13 0600  Gross per 24 hour  Intake 2020.25 ml  Output   1250 ml  Net 770.25 ml    Intake/Output this shift:    Labs:  Recent Labs  08/16/13 2042 08/18/13 0518  HGB 15.1 13.6    Recent Labs  08/16/13 2042 08/18/13 0518  WBC 8.6 10.5  RBC 4.66 4.27  HCT 42.5 39.7  PLT 214 194    Recent Labs  08/16/13 2042 08/18/13 0518  NA 139 141  K 3.7 4.9  CL 98 101  CO2 26 27  BUN 26* 19  CREATININE 1.18 0.83  GLUCOSE 105* 202*  CALCIUM 10.0 8.9    Recent Labs  08/16/13 2042  INR 1.07    EXAM General - Patient is Alert, Appropriate and Oriented Extremity - Neurovascular intact Sensation intact distally Dressing/Incision - clean, dry, no drainage, healing Motor Function - intact, moving foot and toes well on exam.   Past Medical History  Diagnosis Date  . Coronary artery disease     MODERATE  . Hyperlipidemia   . Hypertension   . Headache(784.0)   . Internal hemorrhoids 2009    Dr. Lajoyce Corners Colonoscopy  . History of colon polyps 2009    Dr. Orr/Colonoscopy -small cecal adenoma  . H. pylori infection     Hx of     Assessment/Plan: 2 Days Post-Op Procedure(s) (LRB): ARTHROPLASTY BIPOLAR HIP (Left) Principal  Problem:   Femur fracture, left Active Problems:   Coronary artery disease   Hyperlipidemia   Hypertension   Chronic diastolic heart failure  Estimated body mass index is 25.84 kg/(m^2) as calculated from the following:   Height as of this encounter: 5\' 7"  (1.702 m).   Weight as of this encounter: 74.844 kg (165 lb). Up with therapy Plan for discharge tomorrow Discharge to SNF as per medicine. F/U/ with [name] in 2 weeks. DVT Prophylaxis - Lovenox for 10 days and then ASA therapy. Weight Bearing As Tolerated left Leg  [name], [name] 08/19/2013, 9:00 AM

## 2013-08-20 LAB — TROPONIN I: Troponin I: <0.3 ng/mL (ref <0.30)

## 2013-08-20 MED ORDER — PANTOPRAZOLE SODIUM 40 MG PO TBEC: 40.0000 mg | DELAYED_RELEASE_TABLET | Freq: Every day | ORAL | Status: DC

## 2013-08-20 MED ORDER — ALUM & MAG HYDROXIDE-SIMETH 200-200-20 MG/5ML PO SUSP: 30.0000 mL | Freq: Four times a day (QID) | ORAL | Status: DC | PRN

## 2013-08-20 NOTE — Progress Notes (Signed)
Per MD, Pt ready for d/c.  Notified RN, Pt and facility.  LM for Pt's daughter, [name], at Pt's request.  Confirmed receipt of d/c summary, as admission arranged by Weekday CSW.  Facility ready to receive Pt.  Arranged for transportation.  Bernita Raisin, [place] [employer] [phone]

## 2013-08-20 NOTE — Progress Notes (Signed)
   Subjective: 3 Days Post-Op Procedure(s) (LRB): ARTHROPLASTY BIPOLAR HIP (Left) Patient reports pain as mild.   Had good BM this AM Plan is to go Rehab after hospital stay.  Objective: Vital signs in last 24 hours: Temp:  [97.6 F (36.4 C)-98.4 F (36.9 C)] 98.4 F (36.9 C) (03/07 0535) Pulse Rate:  [64-72] 66 (03/07 0535) Resp:  [16-18] 18 (03/07 0535) BP: (172-183)/(65-71) 175/67 mmHg (03/07 0535) SpO2:  [96 %-98 %] 97 % (03/07 0535)  Intake/Output from previous day:  Intake/Output Summary (Last 24 hours) at 08/20/13 0858 Last data filed at 08/20/13 0559  Gross per 24 hour  Intake    480 ml  Output   1325 ml  Net   -845 ml    Intake/Output this shift:    Labs:  Recent Labs  08/18/13 0518  HGB 13.6    Recent Labs  08/18/13 0518  WBC 10.5  RBC 4.27  HCT 39.7  PLT 194    Recent Labs  08/18/13 0518  NA 141  K 4.9  CL 101  CO2 27  BUN 19  CREATININE 0.83  GLUCOSE 202*  CALCIUM 8.9   No results found for this basename: LABPT, INR,  in the last 72 hours  EXAM General - Patient is Alert, Appropriate and Oriented Extremity - Neurologically intact ABD soft Neurovascular intact Incision: dressing C/D/I No cellulitis present Compartment soft Dressing - no drainage Motor Function - intact, moving foot and toes well on exam.    Past Medical History  Diagnosis Date  . Coronary artery disease     MODERATE  . Hyperlipidemia   . Hypertension   . Headache(784.0)   . Internal hemorrhoids 2009    Dr. Lajoyce Corners Colonoscopy  . History of colon polyps 2009    Dr. Orr/Colonoscopy -small cecal adenoma  . H. pylori infection     Hx of   . Hx of colonoscopy 2009    Dr Lajoyce Corners, hemorrhoids & polyps    Assessment/Plan: 3 Days Post-Op Procedure(s) (LRB): ARTHROPLASTY BIPOLAR HIP (Left) Principal Problem:   Femur fracture, left Active Problems:   Coronary artery disease   Hyperlipidemia   Hypertension   Chronic diastolic heart failure   Discharge to  SNF  DVT Prophylaxis - Lovenox x 10 days then ASA daily Weight-Bearing as tolerated to left leg F/U [name] 2 weeks  Gearlean Alf 08/20/2013, 8:58 AM

## 2013-08-27 ENCOUNTER — Non-Acute Institutional Stay (SKILLED_NURSING_FACILITY): Payer: Medicare Other | Admitting: Internal Medicine

## 2013-08-27 ENCOUNTER — Encounter: Payer: Self-pay | Admitting: Internal Medicine

## 2013-08-27 DIAGNOSIS — I10 Essential (primary) hypertension: Secondary | ICD-10-CM

## 2013-08-27 DIAGNOSIS — S7290XA Unspecified fracture of unspecified femur, initial encounter for closed fracture: Secondary | ICD-10-CM

## 2013-08-27 DIAGNOSIS — I5032 Chronic diastolic (congestive) heart failure: Secondary | ICD-10-CM

## 2013-08-27 DIAGNOSIS — E785 Hyperlipidemia, unspecified: Secondary | ICD-10-CM

## 2013-08-27 DIAGNOSIS — S7292XA Unspecified fracture of left femur, initial encounter for closed fracture: Secondary | ICD-10-CM

## 2013-08-27 DIAGNOSIS — I2510 Atherosclerotic heart disease of native coronary artery without angina pectoris: Secondary | ICD-10-CM

## 2013-08-27 NOTE — Progress Notes (Signed)
MRN: [id number] Name: [name] [name]  Sex: male Age: 80 y.o. DOB: 09/21/1924  [place] #: [facility] Facility/Room: 805-2 Level Of Care: SNF Provider: Inocencio Homes D Emergency Contacts: Extended Emergency Contact Information Primary Emergency Contact: Rosten,[name] Address: [address]          [place], [place] [address] [name] Phone: [phone] Mobile Phone: [phone] Relation: None Secondary Emergency Contact: Mcdaris,[name] Address: [place] Mobile Phone: [phone] Relation: None  Code Status: FULL  Allergies: Review of patient's allergies indicates no known allergies.  Chief Complaint  Patient presents with  . nursing home admission    HPI: Patient is 80 y.o. male who is s/p repair L hip fx who os admitted for OT/PT.  Past Medical History  Diagnosis Date  . Coronary artery disease     MODERATE  . Hyperlipidemia   . Hypertension   . Headache(784.0)   . Internal hemorrhoids 2009    Dr. Lajoyce Corners Colonoscopy  . History of colon polyps 2009    Dr. Orr/Colonoscopy -small cecal adenoma  . H. pylori infection     Hx of   . Hx of colonoscopy 2009    Dr Lajoyce Corners, hemorrhoids & polyps    Past Surgical History  Procedure Laterality Date  . Cardiac catheterization  1997    REVEALED MILD TO MODERATE IRREGULARITIES. HIS LEFT EVNTRICULAR SYSTOLIC FUNCTION REVEALED NORMAL EJECTION FRACTION WITH EF OF 70%  . Appendectomy    . Inguinal hernia repair    . Tonsillectomy    . Hemorroidectomy    . Lower back surgery    . Colonoscopy  multiple  . Upper gastrointestinal endoscopy    . Hip arthroplasty Left 08/17/2013    Procedure: ARTHROPLASTY BIPOLAR HIP;  Surgeon: Gearlean Alf, MD;  Location: [facility];  Service: Orthopedics;  Laterality: Left;      Medication List       This list is accurate as of: 08/27/13 11:59 PM.  Always use your most recent med list.               amLODipine 10  MG tablet  Commonly known as:  NORVASC  None Entered     aspirin EC 81 MG tablet  Take 1 tablet (81 mg total) by mouth daily.     atorvastatin 20 MG tablet  Commonly known as:  LIPITOR  Take 20 mg by mouth. 2 Times a Week     bisacodyl 10 MG suppository  Commonly known as:  DULCOLAX  Place 1 suppository (10 mg total) rectally daily as needed for moderate constipation.     CoQ10 100 MG Caps  Take by mouth daily.     DSS 100 MG Caps  Take 100 mg by mouth 2 (two) times daily.     enoxaparin 40 MG/0.4ML injection  Commonly known as:  LOVENOX  Inject 0.4 mLs (40 mg total) into the skin daily.     finasteride 5 MG tablet  Commonly known as:  PROSCAR  Take 5 mg by mouth daily.     furosemide 20 MG tablet  Commonly known as:  LASIX  Take 40 mg by mouth as needed for fluid.     HYDROcodone-acetaminophen 5-325 MG per tablet  Commonly known as:  NORCO/VICODIN  Take 1-2 tablets by mouth every 4 (four) hours as needed for moderate pain.     KRILL OIL PO  Take 1  capsule by mouth daily.     lisinopril-hydrochlorothiazide 20-25 MG per tablet  Commonly known as:  PRINZIDE,ZESTORETIC  Take 1 tablet by mouth daily.     multivitamin tablet  Take 1 tablet by mouth daily.     NIACIN PO  Take by mouth. 5 days week     senna 8.6 MG Tabs tablet  Commonly known as:  SENOKOT  Take 1 tablet (8.6 mg total) by mouth daily.     traMADol 50 MG tablet  Commonly known as:  ULTRAM  Take 1 tablet (50 mg total) by mouth every 6 (six) hours as needed.     Vitamin D3 1000 UNITS Caps  Take by mouth daily.        Meds ordered this encounter  Medications  . amLODipine (NORVASC) 10 MG tablet    Sig: None Entered     There is no immunization history on file for this patient.  History  Substance Use Topics  . Smoking status: Former Smoker    Quit date: 10/25/1975  . Smokeless tobacco: Never Used  . Alcohol Use: 1.2 oz/week    1 Glasses of wine, 1 Cans of beer per week     Comment:  occ    Family history is noncontributory    Review of Systems  DATA OBTAINED: from patient; no c/o GENERAL: Feels well no fevers, fatigue, appetite changes SKIN: No itching, rash or wounds EYES: No eye pain, redness, discharge EARS: No earache, tinnitus, change in hearing NOSE: No congestion, drainage or bleeding  MOUTH/THROAT: No mouth or tooth pain, No sore throat,  RESPIRATORY: No cough, wheezing, SOB CARDIAC: No chest pain, palpitations, lower extremity edema  GI: No abdominal pain, No N/V/D or constipation, No heartburn or reflux  GU: No dysuria, frequency or urgency, or incontinence  MUSCULOSKELETAL: No unrelieved bone/joint pain NEUROLOGIC: No headache, dizziness or focal weakness PSYCHIATRIC: No overt anxiety or sadness. Sleeps well. No behavior issue.   Filed Vitals:   08/27/13 1226  BP: 147/58  Pulse: 75  Temp: 96.8 F (36 C)  Resp: 20    Physical Exam  GENERAL APPEARANCE: Alert, conversant. Appropriately groomed. No acute distress.  SKIN: No diaphoresis rash, or wounds HEAD: Normocephalic, atraumatic  EYES: Conjunctiva/lids clear. Pupils round, reactive. EOMs intact.  EARS: External exam WNL, canals clear. Hearing grossly normal.  NOSE: No deformity or discharge.  MOUTH/THROAT: Lips w/o lesions. RESPIRATORY: Breathing is even, unlabored. Lung sounds are clear   CARDIOVASCULAR: Heart RRR no murmurs, rubs or gallops. 1+ LLE peripheral edema.  GASTROINTESTINAL: Abdomen is soft, non-tender, not distended w/ normal bowel sounds GENITOURINARY: Bladder non tender, not distended  MUSCULOSKELETAL: No abnormal joints or musculature NEUROLOGIC: Oriented X3. Cranial nerves 2-12 grossly intact. Moves all extremities no tremor. PSYCHIATRIC: Mood and affect appropriate to situation, no behavioral issues  Patient Active Problem List   Diagnosis Date Noted  . Chronic diastolic heart failure 53/97/6734  . Femur fracture, left 08/16/2013  . Bradycardia, severe sinus  10/30/2011  . Personal history of colonic adenoma 01/09/2011  . Family history of malignant neoplasm of colon cancer-mother 01/09/2011  . Coronary artery disease   . Hyperlipidemia   . Hypertension   . Headache     CBC    Component Value Date/Time   WBC 10.5 08/18/2013 0518   RBC 4.27 08/18/2013 0518   HGB 13.6 08/18/2013 0518   HCT 39.7 08/18/2013 0518   PLT 194 08/18/2013 0518   MCV 93.0 08/18/2013 0518   LYMPHSABS 1.6 08/16/2013  2042   MONOABS 1.2* 08/16/2013 2042   EOSABS 0.2 08/16/2013 2042   BASOSABS 0.0 08/16/2013 2042    CMP     Component Value Date/Time   NA 141 08/18/2013 0518   K 4.9 08/18/2013 0518   CL 101 08/18/2013 0518   CO2 27 08/18/2013 0518   GLUCOSE 202* 08/18/2013 0518   BUN 19 08/18/2013 0518   CREATININE 0.83 08/18/2013 0518   CALCIUM 8.9 08/18/2013 0518   PROT 6.9 05/31/2012 0837   ALBUMIN 4.1 05/31/2012 0837   AST 25 05/31/2012 0837   ALT 26 05/31/2012 0837   ALKPHOS 49 05/31/2012 0837   BILITOT 1.2 05/31/2012 0837   GFRNONAA 77* 08/18/2013 0518   GFRAA 90* 08/18/2013 0518    Assessment and Plan  Femur fracture, left S/p L HIP ARTHROPLASTY 3/5; prophylaxed with lovenox; admitted for OT/PT  Coronary artery disease Stable;ECHO 2013 with normal wall motion, LVEF -55%; continue BP control, statin and ASA 81mg   Hypertension Continue zestoretic, norvasc, Lasix  Chronic diastolic heart failure ECHO 2013 EF  55%; continue lasix, BP control, statin, ASA  Hyperlipidemia Continue Lipitor, krill oil, niacin    Hennie Duos, MD

## 2013-08-27 NOTE — Assessment & Plan Note (Signed)
Continue zestoretic, norvasc, Lasix

## 2013-08-27 NOTE — Assessment & Plan Note (Signed)
S/p L HIP ARTHROPLASTY 3/5; prophylaxed with lovenox; admitted for OT/PT

## 2013-08-27 NOTE — Assessment & Plan Note (Signed)
Stable;ECHO 2013 with normal wall motion, LVEF -55%; continue BP control, statin and ASA 81mg 

## 2013-08-27 NOTE — Assessment & Plan Note (Signed)
Continue Lipitor, krill oil, niacin

## 2013-08-27 NOTE — Assessment & Plan Note (Signed)
ECHO 2013 EF  55%; continue lasix, BP control, statin, ASA

## 2013-09-02 ENCOUNTER — Encounter: Payer: Self-pay | Admitting: Adult Health

## 2013-09-02 ENCOUNTER — Non-Acute Institutional Stay (SKILLED_NURSING_FACILITY): Payer: Medicare Other | Admitting: Adult Health

## 2013-09-02 DIAGNOSIS — I5032 Chronic diastolic (congestive) heart failure: Secondary | ICD-10-CM

## 2013-09-02 DIAGNOSIS — E785 Hyperlipidemia, unspecified: Secondary | ICD-10-CM

## 2013-09-02 DIAGNOSIS — S7290XA Unspecified fracture of unspecified femur, initial encounter for closed fracture: Secondary | ICD-10-CM

## 2013-09-02 DIAGNOSIS — S7292XA Unspecified fracture of left femur, initial encounter for closed fracture: Secondary | ICD-10-CM

## 2013-09-02 DIAGNOSIS — I10 Essential (primary) hypertension: Secondary | ICD-10-CM

## 2013-09-02 DIAGNOSIS — I2510 Atherosclerotic heart disease of native coronary artery without angina pectoris: Secondary | ICD-10-CM

## 2013-09-02 NOTE — Progress Notes (Signed)
Patient ID: [name] [name], male   DOB: [birth year], 80 y.o.   MRN: [id number]              PROGRESS NOTE  DATE: 09/02/2013   FACILITY: [facility]  LEVEL OF CARE: SNF (31)  Acute Visit  CHIEF COMPLAINT:  Discharge Notes  HISTORY OF PRESENT ILLNESS: This is an 80 year old male who is for discharge home with Home health PT, OT and Nursing. DME: Rolling walker and bedside commode. He has been admitted to [facility] on 08/20/13 from [facility]. He had a fall and sustained a left hip fracture and had left hip bipolar arthroplasty. Patient was admitted to this facility for short-term rehabilitation after the patient's recenthospitalization.  Patient has completed SNF rehabilitation and therapy has cleared the patient for discharge.  Reassessment of ongoing problem(s):  HTN: Pt 's HTN remains stable.  Denies CP, sob, DOE, pedal edema, headaches, dizziness or visual disturbances.  No complications from the medications currently being used.  Last BP : 131/52  CAD: The angina has been stable. The patient denies dyspnea on exertion, orthopnea, pedal edema, palpitations and paroxysmal nocturnal dyspnea. No complications noted from the medication presently being used.  CHF:The patient does not relate significant weight changes, denies sob, DOE, orthopnea, PNDs, pedal edema, palpitations or chest pain.  CHF remains stable.  No complications form the medications being used.  PAST MEDICAL HISTORY : Reviewed.  No changes.  CURRENT MEDICATIONS: Reviewed per [facility]  REVIEW OF SYSTEMS:  GENERAL: no change in appetite, no fatigue, no weight changes, no fever, chills or weakness RESPIRATORY: no cough, SOB, DOE, wheezing, hemoptysis CARDIAC: no chest pain, edema or palpitations GI: no abdominal pain, diarrhea, constipation, heart burn, nausea or vomiting  PHYSICAL EXAMINATION  GENERAL: no acute distress, normal body habitus EYES: conjunctivae normal, sclerae normal, normal  eye lids NECK: supple, trachea midline, no neck masses, no thyroid tenderness, no thyromegaly LYMPHATICS: no LAN in the neck, no supraclavicular LAN RESPIRATORY: breathing is even & unlabored, BS CTAB CARDIAC: RRR, no murmur,no extra heart sounds, no edema GI: abdomen soft, normal BS, no masses, no tenderness, no hepatomegaly, no splenomegaly EXTREMITIES: able to move all 4 extremities and ambulates with a walker PSYCHIATRIC: the patient is alert & oriented to person, affect & behavior appropriate  LABS/RADIOLOGY: Labs reviewed: Basic Metabolic Panel:  Recent Labs  08/16/13 2042 08/18/13 0518  NA 139 141  K 3.7 4.9  CL 98 101  CO2 26 27  GLUCOSE 105* 202*  BUN 26* 19  CREATININE 1.18 0.83  CALCIUM 10.0 8.9    CBC:  Recent Labs  08/16/13 2042 08/18/13 0518  WBC 8.6 10.5  NEUTROABS 5.6  --   HGB 15.1 13.6  HCT 42.5 39.7  MCV 91.2 93.0  PLT 214 194    Cardiac Enzymes:  Recent Labs  08/19/13 2357  TROPONINI <0.30    ASSESSMENT/PLAN:  Left hip fracture S/P left hip bipolar arthroplasty - for Home health PT, OT and Nursing Hyperlipidemia - continue Lipitor Hypertension - well-controlled; continue Norvasc and Zestoretic Constipation - no complaints Chronic diastolic heart failure - stable    I have filled out patient's discharge paperwork and written prescriptions.  Patient will receive home health PT, OT and Nursing. DME provided: Rolling walker and bedside commode  Total discharge time: Greater than 30 minutes Discharge time involved coordination of the discharge process with Education officer, museum, nursing staff and therapy department. Medical justification for home health services/DME verified.  CPT CODE: 36144   Seth Bake- NP [facility] [phone]

## 2013-09-26 ENCOUNTER — Other Ambulatory Visit: Payer: Self-pay | Admitting: Cardiovascular Disease

## 2014-02-23 ENCOUNTER — Other Ambulatory Visit: Payer: Self-pay | Admitting: Cardiovascular Disease

## 2014-03-02 ENCOUNTER — Telehealth: Payer: Self-pay | Admitting: Cardiovascular Disease

## 2014-03-02 DIAGNOSIS — E785 Hyperlipidemia, unspecified: Secondary | ICD-10-CM

## 2014-03-02 NOTE — Telephone Encounter (Signed)
New message  Pt called requests a call back to determine if lab work is needed//sr

## 2014-03-02 NOTE — Telephone Encounter (Signed)
Spoke with patient who states he would like to get his cholesterol checked due to his wife died last 31-May-2023 and his diet has not been as good as it was.  Patient states PCP has not checked cholesterol since May 31, 2023.  I scheduled patient for fasting lab work on 10/9 prior to 10/13 appointment.  Patient verbalized understanding and agreement.

## 2014-03-08 ENCOUNTER — Non-Acute Institutional Stay (INDEPENDENT_AMBULATORY_CARE_PROVIDER_SITE_OTHER): Payer: Medicare Other | Admitting: Family Medicine

## 2014-03-08 DIAGNOSIS — Z593 Problems related to living in residential institution: Secondary | ICD-10-CM

## 2014-03-08 NOTE — Progress Notes (Addendum)
Patient ID: [name] [name], male   DOB: Oct 22, 1924, 80 y.o.   MRN: [id number] Opened note to add discharge date to NH information.

## 2014-03-13 ENCOUNTER — Encounter: Payer: Self-pay | Admitting: Cardiovascular Disease

## 2014-03-23 ENCOUNTER — Other Ambulatory Visit (INDEPENDENT_AMBULATORY_CARE_PROVIDER_SITE_OTHER): Payer: Medicare Other | Admitting: *Deleted

## 2014-03-23 DIAGNOSIS — E785 Hyperlipidemia, unspecified: Secondary | ICD-10-CM

## 2014-03-23 LAB — LIPID PANEL
Cholesterol: 173 mg/dL (ref 0–200)
HDL: 56 mg/dL (ref >39.00)
LDL Cholesterol: 103 mg/dL — ABNORMAL HIGH (ref 0–99)
NonHDL: 117
Total CHOL/HDL Ratio: 3
Triglycerides: 71 mg/dL (ref 0.0–149.0)
VLDL: 14.2 mg/dL (ref 0.0–40.0)

## 2014-03-23 LAB — BASIC METABOLIC PANEL
BUN: 23 mg/dL (ref 6–23)
CO2: 29 mEq/L (ref 19–32)
Calcium: 9.2 mg/dL (ref 8.4–10.5)
Chloride: 99 mEq/L (ref 96–112)
Creatinine, Ser: 1 mg/dL (ref 0.4–1.5)
GFR: 77.84 mL/min (ref >60.00)
Glucose, Bld: 92 mg/dL (ref 70–99)
Potassium: 3.9 mEq/L (ref 3.5–5.1)
Sodium: 137 mEq/L (ref 135–145)

## 2014-03-23 LAB — HEPATIC FUNCTION PANEL
ALT: 18 U/L (ref 0–53)
AST: 22 U/L (ref 0–37)
Albumin: 3.6 g/dL (ref 3.5–5.2)
Alkaline Phosphatase: 43 U/L (ref 39–117)
Bilirubin, Direct: 0.1 mg/dL (ref 0.0–0.3)
Total Bilirubin: 0.9 mg/dL (ref 0.2–1.2)
Total Protein: 6.9 g/dL (ref 6.0–8.3)

## 2014-03-24 ENCOUNTER — Telehealth: Payer: Self-pay | Admitting: Cardiovascular Disease

## 2014-03-24 ENCOUNTER — Other Ambulatory Visit: Payer: Medicare Other

## 2014-03-24 NOTE — Telephone Encounter (Signed)
Patient calling for lab results. Informed him that Dr. Acie Fredrickson wants him to increase Lipitor to 40 mg daily. He states he is only take the 20mg  3 times a week. Days he will discuss this with dr. Acie Fredrickson at his appointment on Tues. 10/13.

## 2014-03-24 NOTE — Telephone Encounter (Signed)
New message ° ° ° ° °Want test results °

## 2014-03-27 NOTE — Telephone Encounter (Signed)
Message copied by Brett Canales on Mon Mar 27, 2014 12:15 PM ------      Message from: Thayer Headings      Created: Thu Mar 23, 2014  4:47 PM       Lipids are ok but his LDL is still slightly elevated. Hx of moderate CAd      Increase lipitor to 40       Recheck fasting labs in 3 months ------

## 2014-03-27 NOTE — Telephone Encounter (Signed)
Left message to call back  

## 2014-03-27 NOTE — Progress Notes (Signed)
Patient informed. He states he has an appointment with Dr. Acie Fredrickson tomorrow and will discuss with him then.

## 2014-03-28 ENCOUNTER — Ambulatory Visit (INDEPENDENT_AMBULATORY_CARE_PROVIDER_SITE_OTHER): Payer: Medicare Other | Admitting: Cardiovascular Disease

## 2014-03-28 ENCOUNTER — Encounter: Payer: Self-pay | Admitting: Cardiovascular Disease

## 2014-03-28 VITALS — BP 132/80 | HR 61 | Ht 67.0 in | Wt 171.2 lb

## 2014-03-28 DIAGNOSIS — E785 Hyperlipidemia, unspecified: Secondary | ICD-10-CM

## 2014-03-28 DIAGNOSIS — I10 Essential (primary) hypertension: Secondary | ICD-10-CM

## 2014-03-28 MED ORDER — TADALAFIL 20 MG PO TABS: 20.0000 mg | ORAL_TABLET | Freq: Every day | ORAL | Status: DC | PRN

## 2014-03-28 NOTE — Assessment & Plan Note (Signed)
His LDL is 103. He only takes his atorvastatin 3 times a week and does not want to increase his dose.  He has moderate CAD by cath many years ago.

## 2014-03-28 NOTE — Assessment & Plan Note (Signed)
BP is stable. Continue off amlodipine for now. Will continue to monitor.

## 2014-03-28 NOTE — Progress Notes (Signed)
[name] [name] Date of Birth  1925/02/22       [facility] [address], [place], [place]  [address]   [place], [place]  [address] [phone]     [phone]   Fax  [phone]    Fax [phone]  Problem List: 1. Moderate coronary artery disease by heart catheterization 20 years ago 2. Hyperlipidemia 3. Hypertension 4. Mild AI 5, Mild TR  History of Present Illness:  [name] [name] is a 80 y.o. gentleman with the above noted hx.   He still has some ankle edema that he is concerned about.  He has not had any chest pain.   He is still very active and healthy.  Dec. 23, 2014:  [name] [name] has done well.  No CP.  No dyspnea or syncope.   Still moderately active.  He walks some.  He does some stretching.  He wants to start bowling again.    He does have some  symptoms when he first lies down or sits up.  ? Vertigo vs. orthostasis hyotension.   03/28/2014:  [name] [name] is doing ok His wife died last year.  He stopped his amlodipine earlier this year - insurance would not pay for it so he stopped it. BP has been OK since that time Tries to watch his salt.  Still eats canned veggies and other salty food.     Current Outpatient Prescriptions on File Prior to Visit  Medication Sig Dispense Refill  . aspirin EC 81 MG tablet Take 1 tablet (81 mg total) by mouth daily.      Marland Kitchen atorvastatin (LIPITOR) 20 MG tablet TAKE ONE TABLET BY MOUTH EVERY DAY  90 tablet  0  . Cholecalciferol (VITAMIN D3) 1000 UNITS CAPS Take by mouth daily.        . Coenzyme Q10 (COQ10) 100 MG CAPS Take by mouth daily.        . furosemide (LASIX) 20 MG tablet Take 40 mg by mouth as needed for fluid.       Marland Kitchen KRILL OIL PO Take 1 capsule by mouth daily.      Marland Kitchen lisinopril-hydrochlorothiazide (PRINZIDE,ZESTORETIC) 20-25 MG per tablet Take 1 tablet by mouth daily.        . Multiple Vitamin (MULTIVITAMIN) tablet Take 1 tablet by mouth daily.         Marland Kitchen NIACIN PO Take by mouth. 5 days week       No current facility-administered medications on file prior to visit.    No Known Allergies  Past Medical History  Diagnosis Date  . Hyperlipidemia   . Hypertension   . Headache(784.0)   . Internal hemorrhoids 2009    Dr. Lajoyce Corners Colonoscopy  . History of colon polyps 2009    Dr. Orr/Colonoscopy -small cecal adenoma  . H. pylori infection     Hx of   . Hx of colonoscopy 2009    Dr Lajoyce Corners, hemorrhoids & polyps  . Coronary artery disease     MODERATE    Past Surgical History  Procedure Laterality Date  . Cardiac catheterization  1997    REVEALED MILD TO MODERATE IRREGULARITIES. HIS LEFT EVNTRICULAR SYSTOLIC FUNCTION REVEALED NORMAL EJECTION FRACTION WITH EF OF 70%  . Appendectomy    . Inguinal hernia repair    . Tonsillectomy    . Hemorroidectomy    . Lower back surgery    . Colonoscopy  multiple  .  Upper gastrointestinal endoscopy    . Hip arthroplasty Left 08/17/2013    Procedure: ARTHROPLASTY BIPOLAR HIP;  Surgeon: Gearlean Alf, MD;  Location: [facility];  Service: Orthopedics;  Laterality: Left;    History  Smoking status  . Former Smoker  . Quit date: 10/25/1975  Smokeless tobacco  . Never Used    History  Alcohol Use  . 1.2 oz/week  . 1 Glasses of wine, 1 Cans of beer per week    Comment: occ    Family History  Problem Relation Age of Onset  . Colon cancer Mother 28    second cancer in 32's deceased after emergency surgery  . Breast cancer Sister     Reviw of Systems:  Reviewed in the HPI.  All other systems are negative.  Physical Exam: Blood pressure 132/80, pulse 61, height 5\' 7"  (1.702 m), weight 171 lb 3.2 oz (77.656 kg). General: Well developed, well nourished, in no acute distress.  Head: Normocephalic, atraumatic, sclera non-icteric, mucus membranes are moist,   Neck: Supple. Carotids are 2 + without bruits. No JVD  Lungs: Clear bilaterally to auscultation.  Heart: regular rate.  normal   S1 S2.  Very faint systolic murmur  Abdomen: Soft, non-tender, non-distended with normal bowel sounds. No hepatomegaly. No rebound/guarding. No masses.  Msk:  Strength and tone are normal  Extremities: No clubbing or cyanosis. Trace - 1+ edema.  Distal pedal pulses are 2+ and equal bilaterally.  Neuro: Alert and oriented X 3. Moves all extremities spontaneously.  Psych:  Responds to questions appropriately with a normal affect.  ECG:  Dec. 23, 2014:  Sinus brady at 59.  Normal ECG.    Assessment / Plan:

## 2014-03-28 NOTE — Patient Instructions (Addendum)
Your physician recommends that you continue on your current medications as directed. Please refer to the Current Medication list given to you today.   Your physician recommends that you return for lab work in: [place], SAME DAY AS [place] (BMET, LFT, LIPIDS)-  PLEASE BE FASTING    Your physician has recommended you make the following change in your medication: Dr Acie Fredrickson has prescribed you Cialis 20 mg po as needed    Your physician wants you to follow-up in: [place] Acie Fredrickson you will receive a reminder letter in the mail two months in advance. If you don't receive a letter, please call our office to schedule the follow-up appointment.

## 2014-10-17 ENCOUNTER — Other Ambulatory Visit: Payer: Self-pay

## 2014-10-18 ENCOUNTER — Other Ambulatory Visit (INDEPENDENT_AMBULATORY_CARE_PROVIDER_SITE_OTHER): Payer: Medicare HMO | Admitting: *Deleted

## 2014-10-18 DIAGNOSIS — E785 Hyperlipidemia, unspecified: Secondary | ICD-10-CM | POA: Diagnosis not present

## 2014-10-18 DIAGNOSIS — I10 Essential (primary) hypertension: Secondary | ICD-10-CM

## 2014-10-18 LAB — BASIC METABOLIC PANEL
BUN: 20 mg/dL (ref 6–23)
CO2: 31 mEq/L (ref 19–32)
Calcium: 9.5 mg/dL (ref 8.4–10.5)
Chloride: 102 mEq/L (ref 96–112)
Creatinine, Ser: 0.94 mg/dL (ref 0.40–1.50)
GFR: 80.6 mL/min (ref >60.00)
Glucose, Bld: 92 mg/dL (ref 70–99)
Potassium: 3.9 mEq/L (ref 3.5–5.1)
Sodium: 137 mEq/L (ref 135–145)

## 2014-10-18 LAB — HEPATIC FUNCTION PANEL
ALT: 21 U/L (ref 0–53)
AST: 22 U/L (ref 0–37)
Albumin: 4.1 g/dL (ref 3.5–5.2)
Alkaline Phosphatase: 50 U/L (ref 39–117)
Bilirubin, Direct: 0.2 mg/dL (ref 0.0–0.3)
Total Bilirubin: 0.9 mg/dL (ref 0.2–1.2)
Total Protein: 6.9 g/dL (ref 6.0–8.3)

## 2014-10-18 LAB — LIPID PANEL
Cholesterol: 146 mg/dL (ref 0–200)
HDL: 58.2 mg/dL (ref >39.00)
LDL Cholesterol: 74 mg/dL (ref 0–99)
NonHDL: 87.8
Total CHOL/HDL Ratio: 3
Triglycerides: 67 mg/dL (ref 0.0–149.0)
VLDL: 13.4 mg/dL (ref 0.0–40.0)

## 2014-10-18 NOTE — Addendum Note (Signed)
Addended by: Eulis Foster on: 10/18/2014 09:13 AM   Modules accepted: Orders

## 2014-10-20 ENCOUNTER — Ambulatory Visit (INDEPENDENT_AMBULATORY_CARE_PROVIDER_SITE_OTHER): Payer: Medicare HMO | Admitting: Cardiovascular Disease

## 2014-10-20 ENCOUNTER — Encounter: Payer: Self-pay | Admitting: Cardiovascular Disease

## 2014-10-20 VITALS — BP 122/64 | HR 70 | Ht 67.0 in | Wt 175.0 lb

## 2014-10-20 DIAGNOSIS — E785 Hyperlipidemia, unspecified: Secondary | ICD-10-CM

## 2014-10-20 DIAGNOSIS — I5032 Chronic diastolic (congestive) heart failure: Secondary | ICD-10-CM | POA: Diagnosis not present

## 2014-10-20 DIAGNOSIS — I10 Essential (primary) hypertension: Secondary | ICD-10-CM

## 2014-10-20 NOTE — Progress Notes (Signed)
Cardiology Office Note   Date:  10/20/2014   ID:  SHERLOCK NANCARROW, DOB [birth year], MRN [id number]  PCP:  [name], MD  Cardiologist:   Thayer Headings, MD   Chief Complaint  Patient presents with  . Follow-up    HTN, CAD   1. Moderate coronary artery disease by heart catheterization 20 years ago 2. Hyperlipidemia 3. Hypertension 4. Mild AI 5, Mild TR  History of Present Illness:  [name] [name] is a 81 y.o. gentleman with the above noted hx. He still has some ankle edema that he is concerned about. He has not had any chest pain. He is still very active and healthy.  Dec. 23, 2014:  [name] [name] has done well. No CP. No dyspnea or syncope. Still moderately active. He walks some. He does some stretching. He wants to start bowling again. He does have some symptoms when he first lies down or sits up. ? Vertigo vs. orthostasis hyotension.   03/28/2014:  [name] [name] is doing ok His wife died last year.  He stopped his amlodipine earlier this year - insurance would not pay for it so he stopped it. BP has been OK since that time Tries to watch his salt. Still eats canned veggies and other salty food.    Oct 20, 2014:  [name] [name] is a 81 y.o. male who presents for  Follow up for    Past Medical History  Diagnosis Date  . Hyperlipidemia   . Hypertension   . Headache(784.0)   . Internal hemorrhoids 2009    Dr. Lajoyce Corners Colonoscopy  . History of colon polyps 2009    Dr. Orr/Colonoscopy -small cecal adenoma  . H. pylori infection     Hx of   . Hx of colonoscopy 2009    Dr Lajoyce Corners, hemorrhoids & polyps  . Coronary artery disease     MODERATE    Past Surgical History  Procedure Laterality Date  . Cardiac catheterization  1997    REVEALED MILD TO MODERATE IRREGULARITIES. HIS LEFT EVNTRICULAR SYSTOLIC FUNCTION REVEALED NORMAL EJECTION FRACTION WITH EF OF 70%  . Appendectomy    . Inguinal hernia repair    . Tonsillectomy    .  Hemorroidectomy    . Lower back surgery    . Colonoscopy  multiple  . Upper gastrointestinal endoscopy    . Hip arthroplasty Left 08/17/2013    Procedure: ARTHROPLASTY BIPOLAR HIP;  Surgeon: Gearlean Alf, MD;  Location: [facility];  Service: Orthopedics;  Laterality: Left;     Current Outpatient Prescriptions  Medication Sig Dispense Refill  . aspirin EC 81 MG tablet Take 1 tablet (81 mg total) by mouth daily.    Marland Kitchen atorvastatin (LIPITOR) 20 MG tablet TAKE ONE TABLET BY MOUTH EVERY DAY 90 tablet 0  . Cholecalciferol (VITAMIN D3) 1000 UNITS CAPS Take by mouth daily.      . Coenzyme Q10 (COQ10) 100 MG CAPS Take by mouth daily.      . furosemide (LASIX) 20 MG tablet Take 40 mg by mouth as needed for fluid.     Marland Kitchen KRILL OIL PO Take 1 capsule by mouth daily.    Marland Kitchen lisinopril-hydrochlorothiazide (PRINZIDE,ZESTORETIC) 20-25 MG per tablet Take 1 tablet by mouth daily.      . Multiple Vitamin (MULTIVITAMIN) tablet Take 1 tablet by mouth daily.      Marland Kitchen NIACIN PO Take by mouth. 5 days week    . tadalafil (CIALIS) 20 MG tablet Take 1 tablet (20  mg total) by mouth daily as needed for erectile dysfunction. 10 tablet 0   No current facility-administered medications for this visit.    Allergies:   Review of patient's allergies indicates no known allergies.    Social History:  The patient  reports that he quit smoking about 39 years ago. He has never used smokeless tobacco. He reports that he drinks about 1.2 oz of alcohol per week. He reports that he does not use illicit drugs.   Family History:  The patient's family history includes Breast cancer in his sister; Colon cancer (age of onset: 72) in his mother.    ROS:  Please see the history of present illness.    Review of Systems: Constitutional:  denies fever, chills, diaphoresis, appetite change and fatigue.  HEENT: denies photophobia, eye pain, redness, hearing loss, ear pain, congestion, sore throat, rhinorrhea, sneezing, neck pain, neck stiffness  and tinnitus.  Respiratory: denies SOB, DOE, cough, chest tightness, and wheezing.  Cardiovascular: denies chest pain, palpitations and leg swelling.  Gastrointestinal: denies nausea, vomiting, abdominal pain, diarrhea, constipation, blood in stool.  Genitourinary: denies dysuria, urgency, frequency, hematuria, flank pain and difficulty urinating.  Musculoskeletal: denies  myalgias, back pain, joint swelling, arthralgias and gait problem.   Skin: denies pallor, rash and wound.  Neurological: denies dizziness, seizures, syncope, weakness, light-headedness, numbness and headaches.   Hematological: denies adenopathy, easy bruising, personal or family bleeding history.  Psychiatric/ Behavioral: denies suicidal ideation, mood changes, confusion, nervousness, sleep disturbance and agitation.       All other systems are reviewed and negative.    PHYSICAL EXAM: VS:  Ht 5\' 7"  (1.702 m)  Wt 175 lb (79.379 kg)  BMI 27.40 kg/m2 , BMI Body mass index is 27.4 kg/(m^2). GEN: Well nourished, well developed, in no acute distress HEENT: normal Neck: no JVD, carotid bruits, or masses Cardiac: RRR; no murmurs, rubs, or gallops,no edema  Respiratory:  clear to auscultation bilaterally, normal work of breathing GI: soft, nontender, nondistended, + BS MS: no deformity or atrophy Skin: warm and dry, no rash Neuro:  Strength and sensation are intact Psych: normal   EKG:  EKG is ordered today. The ekg ordered today demonstrates  NSR at 70 .  Occasional PVCs    Recent Labs: 10/18/2014: ALT 21; BUN 20; Creatinine 0.94; Potassium 3.9; Sodium 137    Lipid Panel    Component Value Date/Time   CHOL 146 10/18/2014 0913   TRIG 67.0 10/18/2014 0913   HDL 58.20 10/18/2014 0913   CHOLHDL 3 10/18/2014 0913   VLDL 13.4 10/18/2014 0913   LDLCALC 74 10/18/2014 0913      Wt Readings from Last 3 Encounters:  10/20/14 175 lb (79.379 kg)  03/28/14 171 lb 3.2 oz (77.656 kg)  09/02/13 170 lb 12.8 oz (77.474  kg)      Other studies Reviewed: Additional studies/ records that were reviewed today include: . Review of the above records demonstrates:    ASSESSMENT AND PLAN:  1. Moderate coronary artery disease by heart catheterization 20 years ago 2. Hyperlipidemia- his lipid levels look very stable. Continue current medications. 3. Hypertension - his blood pressure remains well-controlled. Continue current medications. 4. Mild AI 5, Mild TR 6. Chronic diastolic congestive heart failure-he remains very stable. Continue current medications.   Current medicines are reviewed at length with the patient today.  The patient does not have concerns regarding medicines.  The following changes have been made:  no change  Labs/ tests ordered today include:  No orders of the defined types were placed in this encounter.     Disposition:   FU with me in 1 year     Delanna Blacketer, [name], MD  10/20/2014 2:17 PM    [facility] [place], [place], [place]  [address] Phone: [phone]; Fax: [phone]   [facility]  [address] [place], [place]  [address] [phone]    Fax [phone]

## 2014-10-20 NOTE — Patient Instructions (Addendum)
Medication Instructions:  Your physician recommends that you continue on your current medications as directed. Please refer to the Current Medication list given to you today.   Labwork: Your physician recommends that you return for lab work in: 1 year on the day of or a few days before your office visit with Dr. Acie Fredrickson.  You will need to FAST for this appointment - nothing to eat or drink after midnight the night before except water.   Testing/Procedures: None  Follow-Up: Your physician wants you to follow-up in: 1 year with Dr. Acie Fredrickson.  You will receive a reminder letter in the mail two months in advance. If you don't receive a letter, please call our office to schedule the follow-up appointment.

## 2014-11-12 ENCOUNTER — Other Ambulatory Visit: Payer: Self-pay | Admitting: Cardiovascular Disease

## 2015-03-20 DIAGNOSIS — I10 Essential (primary) hypertension: Secondary | ICD-10-CM | POA: Diagnosis not present

## 2015-03-20 DIAGNOSIS — Z7982 Long term (current) use of aspirin: Secondary | ICD-10-CM | POA: Diagnosis not present

## 2015-03-23 DIAGNOSIS — Z23 Encounter for immunization: Secondary | ICD-10-CM | POA: Diagnosis not present

## 2015-03-23 DIAGNOSIS — E785 Hyperlipidemia, unspecified: Secondary | ICD-10-CM | POA: Diagnosis not present

## 2015-03-23 DIAGNOSIS — I10 Essential (primary) hypertension: Secondary | ICD-10-CM | POA: Diagnosis not present

## 2015-03-23 DIAGNOSIS — Z1212 Encounter for screening for malignant neoplasm of rectum: Secondary | ICD-10-CM | POA: Diagnosis not present

## 2015-03-23 DIAGNOSIS — N401 Enlarged prostate with lower urinary tract symptoms: Secondary | ICD-10-CM | POA: Diagnosis not present

## 2015-03-23 DIAGNOSIS — R600 Localized edema: Secondary | ICD-10-CM | POA: Diagnosis not present

## 2015-03-27 ENCOUNTER — Telehealth: Payer: Self-pay | Admitting: Internal Medicine

## 2015-03-27 ENCOUNTER — Encounter: Payer: Self-pay | Admitting: Physician Assistant

## 2015-03-27 NOTE — Telephone Encounter (Signed)
Appointment scheduled with Nicoletta Ba for 04/13/15

## 2015-03-27 NOTE — Telephone Encounter (Signed)
Notes that were faxed states rectal bleeding x 1 recently.  Patient will be offered an earlier appt with APP by [facility]

## 2015-04-13 ENCOUNTER — Encounter: Payer: Self-pay | Admitting: Physician Assistant

## 2015-04-13 ENCOUNTER — Ambulatory Visit (INDEPENDENT_AMBULATORY_CARE_PROVIDER_SITE_OTHER): Payer: Medicare HMO | Admitting: Physician Assistant

## 2015-04-13 VITALS — BP 132/68 | HR 64 | Ht 67.0 in | Wt 169.0 lb

## 2015-04-13 DIAGNOSIS — K625 Hemorrhage of anus and rectum: Secondary | ICD-10-CM | POA: Diagnosis not present

## 2015-04-13 NOTE — Patient Instructions (Signed)
Follow up with Dr. Silvano Rusk as needed.

## 2015-04-13 NOTE — Progress Notes (Signed)
Patient ID: [name] [name], male   DOB: [birth year], 81 y.o.   MRN: [id number]   Subjective:    Patient ID: [name] [name], male    DOB: 04/09/1925, 81 y.o.   MRN: [id number]  HPI  Cutter is a very nice 81 year old white male patient of Dr. Shelia Media and known to Dr. Carlean Purl from colonoscopy done in December 2012. He had internal hemorrhoids noted and 2 diminutive polyps were removed which were adenomas. No follow-up was planned due to his age. Comes in today after a recent episode with constipation and a scant amount of rectal bleeding on a couple of occasions. Patient says that he was having a lot of back problems within the past 2-3 months and was given a course of steroids which helped quite a bit. During that time he was also taking pain medication and became constipated. He says with straining he noted a small amount of blood on 2-3 occasions. Over the past month he has had no problems says with his back being batteries not requiring any pain medication and his bowel movements are back to normal. He has no complaints of abdominal discomfort and has not seen any further blood. No complaint of rectal discomfort or pain.  Review of Systems  Pertinent positive and negative review of systems were noted in the above HPI section.  All other review of systems was otherwise negative. Outpatient Encounter Prescriptions as of 04/13/2015  Medication Sig  . amLODipine (NORVASC) 5 MG tablet Take 5 mg by mouth daily.  Marland Kitchen aspirin EC 81 MG tablet Take 1 tablet (81 mg total) by mouth daily.  Marland Kitchen atorvastatin (LIPITOR) 20 MG tablet TAKE ONE TABLET BY MOUTH EVERY DAY (Patient taking differently: 3 x a week)  . Cholecalciferol (VITAMIN D3) 1000 UNITS CAPS Take by mouth daily.    . Coenzyme Q10 (COQ10) 100 MG CAPS Take 100 mg by mouth daily.   . furosemide (LASIX) 20 MG tablet Take 40 mg by mouth daily.   Marland Kitchen KRILL OIL PO Take 1 capsule by mouth 1 day or 1 dose.   Marland Kitchen lisinopril-hydrochlorothiazide  (PRINZIDE,ZESTORETIC) 20-25 MG per tablet Take 1 tablet by mouth daily.    . Multiple Vitamin (MULTIVITAMIN) tablet Take 1 tablet by mouth daily.    Marland Kitchen NIACIN PO Take 500 mg by mouth daily.   . traMADol (ULTRAM) 50 MG tablet Take 50 mg by mouth every 6 (six) hours as needed.  . zinc gluconate 50 MG tablet Take 100 mg by mouth 3 (three) times a week.   . [DISCONTINUED] tadalafil (CIALIS) 20 MG tablet Take 1 tablet (20 mg total) by mouth daily as needed for erectile dysfunction. (Patient not taking: Reported on 10/20/2014)   No facility-administered encounter medications on file as of 04/13/2015.   No Known Allergies Patient Active Problem List   Diagnosis Date Noted  . Chronic diastolic heart failure ([place]) 08/17/2013  . Femur fracture, left ([place]) 08/16/2013  . Bradycardia, severe sinus 10/30/2011  . Personal history of colonic adenoma 01/09/2011  . Family history of malignant neoplasm of colon cancer-mother 01/09/2011  . Coronary artery disease   . Hyperlipidemia   . Hypertension   . Headache(784.0)    Social History   Social History  . Marital Status: Widowed    Spouse Name: N/A  . Number of Children: N/A  . Years of Education: N/A   Occupational History  . Not on file.   Social History Main Topics  . Smoking status: Former Smoker  Quit date: 10/25/1975  . Smokeless tobacco: Never Used  . Alcohol Use: 1.2 oz/week    1 Glasses of wine, 1 Cans of beer per week     Comment: occ  . Drug Use: No  . Sexual Activity: Not on file   Other Topics Concern  . Not on file   Social History Narrative    Mr. Apuzzo family history includes Breast cancer in his sister; Colon cancer (age of onset: 82) in his mother.      Objective:    Filed Vitals:   04/13/15 1445  BP: 132/68  Pulse: 64    Physical Exam  well-developed elderly white male in no acute distress, quite pleasant blood pressure 132/68 pulse 64 height 5 foot 7 weight 169, HEENT; nontraumatic normocephalic EOMI  PERRLA sclera anicteric, Supple ;no JVD, Cardiovascular; regular rate and rhythm with V7-C3  , soft systolic murmur, Pulmonary; clear bilaterally, Abdomen ;soft nontender nondistended bowel sounds are active there is no palpable mass or hepatosplenomegaly, Rectal ;exam no external hemorrhoids noted he's nontender to digital exam and on anoscopy does have an internal hemorrhoid slightly friable, Remedies no clubbing cyanosis or edema, Neuropsych; mood and affect appropriate      Assessment & Plan:   #1 81 yo male with recent episode  of constipation related to pain meds , and scant hematochezia with straining- resolved Suspect secondary to internal hemorrhoid #2 dimunitive  adenomatous polyps -last Colon 05/2011  Plan; Observation Pt asked to return for any recurrent bleeding or problems,. Do not plan further surveillance colonoscopies at age 47   Mckinnley Cottier S Elzabeth Mcquerry PA-C 04/13/2015   Cc: Deland Pretty, MD

## 2015-04-25 NOTE — Progress Notes (Signed)
Agree with Ms. Esterwood's assessment and plan. Carl E. Gessner, MD, FACG   

## 2015-06-04 DIAGNOSIS — R69 Illness, unspecified: Secondary | ICD-10-CM | POA: Diagnosis not present

## 2015-08-06 ENCOUNTER — Ambulatory Visit (INDEPENDENT_AMBULATORY_CARE_PROVIDER_SITE_OTHER): Payer: Medicare HMO | Admitting: *Deleted

## 2015-08-06 DIAGNOSIS — R079 Chest pain, unspecified: Secondary | ICD-10-CM | POA: Diagnosis not present

## 2015-08-06 NOTE — Progress Notes (Signed)
PT WALKED INTO  OFFICE  WITH  COMPLAINTS OF  DULL SHARP  CHEST  PAIN SINCE  Friday NOTES  INCREASE IN  INTENSITY  WITH  COUGH,SNEEZE.OR BLOWING OF NOSE .PER PT  WENT  TO  GYM  ON  Friday AND  TODAY  LOW IMPACT NO WEIGHT LIFTING  AND  WALKING. NOTES  LEG  WEAKNESS NO OTHER  SYMPTOMS NOTED . EKG   DONE  AND  DISCUSSED  WITH  DR CAMNITZ .PER  DR  CAMNITZ NO  CHANGE IN  EKG SINCE LAST ONE ON RECORD FROM 10-2014. ALSO WITH  SYMPTOMS  SOUNDS  MUSCULAR SHOULD FOLLOW UP PMD  AND FOLLOW UP WITH  DR Acie Fredrickson  AS  SCHEDULED PT VERBALIZED  UNDERSTANDING./CY

## 2015-10-09 DIAGNOSIS — J029 Acute pharyngitis, unspecified: Secondary | ICD-10-CM | POA: Diagnosis not present

## 2015-10-09 DIAGNOSIS — J309 Allergic rhinitis, unspecified: Secondary | ICD-10-CM | POA: Diagnosis not present

## 2015-10-17 ENCOUNTER — Telehealth: Payer: Self-pay | Admitting: Cardiovascular Disease

## 2015-10-17 ENCOUNTER — Emergency Department (HOSPITAL_COMMUNITY): Payer: Medicare HMO

## 2015-10-17 ENCOUNTER — Emergency Department (HOSPITAL_COMMUNITY)
Admission: EM | Admit: 2015-10-17 | Discharge: 2015-10-17 | Disposition: A | Discharge status: Home / Self Care | Payer: Medicare HMO | Attending: Emergency Medicine | Admitting: Emergency Medicine

## 2015-10-17 ENCOUNTER — Encounter (HOSPITAL_COMMUNITY): Payer: Self-pay | Admitting: Emergency Medicine

## 2015-10-17 DIAGNOSIS — H9190 Unspecified hearing loss, unspecified ear: Secondary | ICD-10-CM | POA: Insufficient documentation

## 2015-10-17 DIAGNOSIS — Z79899 Other long term (current) drug therapy: Secondary | ICD-10-CM | POA: Diagnosis not present

## 2015-10-17 DIAGNOSIS — D329 Benign neoplasm of meninges, unspecified: Secondary | ICD-10-CM | POA: Diagnosis not present

## 2015-10-17 DIAGNOSIS — Z86010 Personal history of colonic polyps: Secondary | ICD-10-CM | POA: Diagnosis not present

## 2015-10-17 DIAGNOSIS — Y9289 Other specified places as the place of occurrence of the external cause: Secondary | ICD-10-CM | POA: Insufficient documentation

## 2015-10-17 DIAGNOSIS — Z7982 Long term (current) use of aspirin: Secondary | ICD-10-CM | POA: Insufficient documentation

## 2015-10-17 DIAGNOSIS — Z87891 Personal history of nicotine dependence: Secondary | ICD-10-CM | POA: Diagnosis not present

## 2015-10-17 DIAGNOSIS — Y998 Other external cause status: Secondary | ICD-10-CM | POA: Diagnosis not present

## 2015-10-17 DIAGNOSIS — W1839XA Other fall on same level, initial encounter: Secondary | ICD-10-CM | POA: Insufficient documentation

## 2015-10-17 DIAGNOSIS — E785 Hyperlipidemia, unspecified: Secondary | ICD-10-CM | POA: Diagnosis not present

## 2015-10-17 DIAGNOSIS — I2510 Atherosclerotic heart disease of native coronary artery without angina pectoris: Secondary | ICD-10-CM | POA: Insufficient documentation

## 2015-10-17 DIAGNOSIS — Z043 Encounter for examination and observation following other accident: Secondary | ICD-10-CM | POA: Diagnosis not present

## 2015-10-17 DIAGNOSIS — Z87438 Personal history of other diseases of male genital organs: Secondary | ICD-10-CM | POA: Insufficient documentation

## 2015-10-17 DIAGNOSIS — I10 Essential (primary) hypertension: Secondary | ICD-10-CM | POA: Diagnosis not present

## 2015-10-17 DIAGNOSIS — R42 Dizziness and giddiness: Secondary | ICD-10-CM | POA: Diagnosis not present

## 2015-10-17 DIAGNOSIS — Y9389 Activity, other specified: Secondary | ICD-10-CM | POA: Diagnosis not present

## 2015-10-17 DIAGNOSIS — S199XXA Unspecified injury of neck, initial encounter: Secondary | ICD-10-CM | POA: Diagnosis not present

## 2015-10-17 DIAGNOSIS — Z8719 Personal history of other diseases of the digestive system: Secondary | ICD-10-CM | POA: Diagnosis not present

## 2015-10-17 LAB — URINALYSIS, ROUTINE W REFLEX MICROSCOPIC
Bilirubin Urine: NEGATIVE
Glucose, UA: NEGATIVE mg/dL
Hgb urine dipstick: NEGATIVE
Ketones, ur: NEGATIVE mg/dL
Leukocytes, UA: NEGATIVE
Nitrite: NEGATIVE
Protein, ur: NEGATIVE mg/dL
Specific Gravity, Urine: 1.021 (ref 1.005–1.030)
pH: 7 (ref 5.0–8.0)

## 2015-10-17 LAB — CBC
HCT: 41.5 % (ref 39.0–52.0)
Hemoglobin: 13.8 g/dL (ref 13.0–17.0)
MCH: 30.9 pg (ref 26.0–34.0)
MCHC: 33.3 g/dL (ref 30.0–36.0)
MCV: 93 fL (ref 78.0–100.0)
Platelets: 205 10*3/uL (ref 150–400)
RBC: 4.46 MIL/uL (ref 4.22–5.81)
RDW: 12.4 % (ref 11.5–15.5)
WBC: 7.6 10*3/uL (ref 4.0–10.5)

## 2015-10-17 LAB — I-STAT CHEM 8, ED
BUN: 21 mg/dL — ABNORMAL HIGH (ref 6–20)
Calcium, Ion: 1.19 mmol/L (ref 1.13–1.30)
Chloride: 99 mmol/L — ABNORMAL LOW (ref 101–111)
Creatinine, Ser: 0.9 mg/dL (ref 0.61–1.24)
Glucose, Bld: 133 mg/dL — ABNORMAL HIGH (ref 65–99)
HCT: 42 % (ref 39.0–52.0)
Hemoglobin: 14.3 g/dL (ref 13.0–17.0)
Potassium: 3.8 mmol/L (ref 3.5–5.1)
Sodium: 138 mmol/L (ref 135–145)
TCO2: 26 mmol/L (ref 0–100)

## 2015-10-17 LAB — COMPREHENSIVE METABOLIC PANEL
ALT: 26 U/L (ref 17–63)
AST: 28 U/L (ref 15–41)
Albumin: 3.7 g/dL (ref 3.5–5.0)
Alkaline Phosphatase: 51 U/L (ref 38–126)
Anion gap: 8 (ref 5–15)
BUN: 18 mg/dL (ref 6–20)
CO2: 26 mmol/L (ref 22–32)
Calcium: 9.2 mg/dL (ref 8.9–10.3)
Chloride: 103 mmol/L (ref 101–111)
Creatinine, Ser: 0.99 mg/dL (ref 0.61–1.24)
GFR calc Af Amer: >60 mL/min (ref >60)
GFR calc non Af Amer: >60 mL/min (ref >60)
Glucose, Bld: 141 mg/dL — ABNORMAL HIGH (ref 65–99)
Potassium: 3.9 mmol/L (ref 3.5–5.1)
Sodium: 137 mmol/L (ref 135–145)
Total Bilirubin: 1.1 mg/dL (ref 0.3–1.2)
Total Protein: 6.5 g/dL (ref 6.5–8.1)

## 2015-10-17 LAB — APTT: aPTT: 35 seconds (ref 24–37)

## 2015-10-17 LAB — DIFFERENTIAL
Basophils Absolute: 0 10*3/uL (ref 0.0–0.1)
Basophils Relative: 1 %
Eosinophils Absolute: 0.3 10*3/uL (ref 0.0–0.7)
Eosinophils Relative: 4 %
Lymphocytes Relative: 18 %
Lymphs Abs: 1.4 10*3/uL (ref 0.7–4.0)
Monocytes Absolute: 0.7 10*3/uL (ref 0.1–1.0)
Monocytes Relative: 9 %
Neutro Abs: 5.1 10*3/uL (ref 1.7–7.7)
Neutrophils Relative %: 68 %

## 2015-10-17 LAB — RAPID URINE DRUG SCREEN, HOSP PERFORMED
Amphetamines: NOT DETECTED
Barbiturates: NOT DETECTED
Benzodiazepines: NOT DETECTED
Cocaine: NOT DETECTED
Opiates: NOT DETECTED
Tetrahydrocannabinol: NOT DETECTED

## 2015-10-17 LAB — I-STAT TROPONIN, ED: Troponin i, poc: 0 ng/mL (ref 0.00–0.08)

## 2015-10-17 LAB — PROTIME-INR
INR: 1.12 (ref 0.00–1.49)
Prothrombin Time: 14.6 seconds (ref 11.6–15.2)

## 2015-10-17 MED ORDER — METHYLPREDNISOLONE 4 MG PO TBPK: ORAL_TABLET | ORAL | Status: DC

## 2015-10-17 MED ORDER — LORAZEPAM 1 MG PO TABS
1.0000 mg | ORAL_TABLET | Freq: Once | ORAL | Status: AC
  Administered 2015-10-17: 1 mg via ORAL
  Filled 2015-10-17: qty 1

## 2015-10-17 MED ORDER — GADOBENATE DIMEGLUMINE 529 MG/ML IV SOLN
15.0000 mL | Freq: Once | INTRAVENOUS | Status: AC | PRN
  Administered 2015-10-17: 15 mL via INTRAVENOUS

## 2015-10-17 MED ORDER — DEXAMETHASONE SODIUM PHOSPHATE 10 MG/ML IJ SOLN
10.0000 mg | Freq: Once | INTRAMUSCULAR | Status: AC
  Administered 2015-10-17: 10 mg via INTRAVENOUS
  Filled 2015-10-17: qty 1

## 2015-10-17 NOTE — Telephone Encounter (Signed)
Instructed to take patient to ED to be evaluated to r/o TIA or other causes. Patient is agreeable to plan and neighbor, Anderson Malta, will help him get to the hospital.

## 2015-10-17 NOTE — ED Notes (Signed)
Pt in CT.

## 2015-10-17 NOTE — ED Notes (Signed)
MRI contacted regarding status of patient coming over for his test, advised they would be over soon to get patient for his scan.

## 2015-10-17 NOTE — Telephone Encounter (Signed)
Patient's neighbor calling reporting patient was going to walk across the street to her house to speak with her husband who was out in the yard. Patient began to feel a sudden onset of lightheadedness/dizziness.  Patient was found in a seated position in his bushes.  This occurred about 20 min ago. Patient is speaking/answering in the background during phone call. Wrist BP cuff reporting ranges from 145-160/80-90s.  Last recorded is 160/92. Informed her that I would discuss with Dr. Acie Fredrickson, as he is in the office, and most likely patient will need to go to ED to be evaluated. She understands I will call back in a moment.

## 2015-10-17 NOTE — Telephone Encounter (Signed)
New Message  [name] [name] the patient [name] [name] states that she found the pt outside on the ground. He was light headed and fell into the bushes.   Pt c/o BP issue:  1. What are your last 5 BP readings?   155/95 initially and it is now 145/85.   2. Are you having any other symptoms (ex. Dizziness, headache, blurred vision, passed out)? Dizziness lightheaded and he fell out (lost balance)    Neighbor said she is willing ot bring him in for a look

## 2015-10-17 NOTE — ED Notes (Signed)
Pt ambulates independently and with steady gait at time of discharge. Discharge instructions and follow up information reviewed with patient. No other questions or concerns voiced at this time.  

## 2015-10-17 NOTE — ED Provider Notes (Addendum)
CSN: [id number]     Arrival date & time 10/17/15  1325 History   First MD Initiated Contact with Patient 10/17/15 [phone]     Chief Complaint  Patient presents with  . Fall  . Dizziness     (Consider location/radiation/quality/duration/timing/severity/associated sxs/prior Treatment) HPI Patient was walking down his driveway at 12 noon today when he found himself leaning to the left and fell to the ground he did not injure himself as result fall. His neighbor found him sitting on the ground and he was unable to get up. Since the event patient has been ambulatory and feels "tired and washed out. He denies difficulty speaking no focal numbness or weakness no other associated symptoms symptoms were not similar to vertigo he has had in the past Past Medical History  Diagnosis Date  . Hyperlipidemia   . Hypertension   . Headache(784.0)   . Internal hemorrhoids 2009    Dr. Lajoyce Corners Colonoscopy  . History of colon polyps 2009    Dr. Orr/Colonoscopy -small cecal adenoma  . H. pylori infection     Hx of   . Hx of colonoscopy 2009    Dr Lajoyce Corners, hemorrhoids & polyps  . Coronary artery disease     MODERATE  . Mitral valve regurgitation   . Edema of lower extremity   . Allergic rhinitis   . Erectile dysfunction   . Amnesia   . Reflux esophagitis   . Dizziness   . Bradycardia   . Hard of hearing   . Colon polyps    Past Surgical History  Procedure Laterality Date  . Cardiac catheterization  1997    REVEALED MILD TO MODERATE IRREGULARITIES. HIS LEFT EVNTRICULAR SYSTOLIC FUNCTION REVEALED NORMAL EJECTION FRACTION WITH EF OF 70%  . Appendectomy    . Inguinal hernia repair Bilateral 2006  . Tonsillectomy    . Hemorroidectomy    . Lower back surgery  2006  . Colonoscopy  multiple  . Upper gastrointestinal endoscopy    . Hip arthroplasty Left 08/17/2013    Procedure: ARTHROPLASTY BIPOLAR HIP;  Surgeon: Gearlean Alf, MD;  Location: [facility];  Service: Orthopedics;  Laterality: Left;  . Cataract  extraction, bilateral    . Nasal septum surgery     Family History  Problem Relation Age of Onset  . Colon cancer Mother 83    second cancer in 71's deceased after emergency surgery  . Breast cancer Sister    Social History  Substance Use Topics  . Smoking status: Former Smoker    Quit date: 10/25/1975  . Smokeless tobacco: Never Used  . Alcohol Use: 1.2 oz/week    1 Glasses of wine, 1 Cans of beer per week     Comment: occ    Review of Systems  Constitutional: Negative.   HENT: Negative.   Respiratory: Negative.   Cardiovascular: Negative.   Gastrointestinal: Negative.   Musculoskeletal: Negative.   Skin: Negative.   Neurological: Negative.        Gait Out of balance earlier today. Currently feels generally weak  Psychiatric/Behavioral: Negative.   All other systems reviewed and are negative.     Allergies  Review of patient's allergies indicates no known allergies.  Home Medications   Prior to Admission medications   Medication Sig Start Date End Date Taking? Authorizing Provider  amLODipine (NORVASC) 5 MG tablet Take 5 mg by mouth daily.    Historical Provider, MD  aspirin EC 81 MG tablet Take 1 tablet (81 mg total) by  mouth daily. 08/19/13   Kinnie Feil, MD  atorvastatin (LIPITOR) 20 MG tablet TAKE ONE TABLET BY MOUTH EVERY DAY Patient taking differently: 3 x a week 11/14/14   Thayer Headings, MD  Cholecalciferol (VITAMIN D3) 1000 UNITS CAPS Take by mouth daily.      Historical Provider, MD  Coenzyme Q10 (COQ10) 100 MG CAPS Take 100 mg by mouth daily.     Historical Provider, MD  furosemide (LASIX) 20 MG tablet Take 40 mg by mouth daily.     Historical Provider, MD  KRILL OIL PO Take 1 capsule by mouth 1 day or 1 dose.     Historical Provider, MD  lisinopril-hydrochlorothiazide (PRINZIDE,ZESTORETIC) 20-25 MG per tablet Take 1 tablet by mouth daily.      Historical Provider, MD  Multiple Vitamin (MULTIVITAMIN) tablet Take 1 tablet by mouth daily.       Historical Provider, MD  NIACIN PO Take 500 mg by mouth daily.     Historical Provider, MD  traMADol (ULTRAM) 50 MG tablet Take 50 mg by mouth every 6 (six) hours as needed.    Historical Provider, MD  zinc gluconate 50 MG tablet Take 100 mg by mouth 3 (three) times a week.     Historical Provider, MD   BP 160/74 mmHg  Pulse 62  Temp(Src) 97.6 F (36.4 C) (Oral)  Resp 16  SpO2 95% Physical Exam  Constitutional: He is oriented to person, place, and time. He appears well-developed and well-nourished.  HENT:  Head: Normocephalic and atraumatic.  Eyes: Conjunctivae are normal. Pupils are equal, round, and reactive to light.  Neck: Neck supple. No tracheal deviation present. No thyromegaly present.  Cardiovascular: Normal rate and regular rhythm.   No murmur heard. Pulmonary/Chest: Effort normal and breath sounds normal.  Abdominal: Soft. Bowel sounds are normal. He exhibits no distension. There is no tenderness.  Musculoskeletal: Normal range of motion. He exhibits no edema or tenderness.  Neurological: He is alert and oriented to person, place, and time. No cranial nerve deficit. Coordination normal.  Gait normal motor strength 5 over 5 overall  Skin: Skin is warm and dry. No rash noted.  Psychiatric: He has a normal mood and affect.  Nursing note and vitals reviewed.   ED Course  Procedures (including critical care time) Labs Review Labs Reviewed  COMPREHENSIVE METABOLIC PANEL - Abnormal; Notable for the following:    Glucose, Bld 141 (*)    All other components within normal limits  I-STAT CHEM 8, ED - Abnormal; Notable for the following:    Chloride 99 (*)    BUN 21 (*)    Glucose, Bld 133 (*)    All other components within normal limits  PROTIME-INR  APTT  CBC  DIFFERENTIAL  URINE RAPID DRUG SCREEN, HOSP PERFORMED  URINALYSIS, ROUTINE W REFLEX MICROSCOPIC (NOT AT [facility])  ETHANOL  I-STAT TROPOININ, ED    Imaging Review No results found. I have personally reviewed  and evaluated these images and lab results as part of my medical decision-making.   EKG Interpretation   Date/Time:  Wednesday Oct 17 2015 13:39:11 EDT Ventricular Rate:  59 PR Interval:  204 QRS Duration: 108 QT Interval:  410 QTC Calculation: 405 R Axis:   32 Text Interpretation:  Sinus bradycardia Nonspecific ST abnormality  Abnormal ECG No significant change since last tracing Confirmed by  Winfred Leeds  MD, Yordan Martindale [phone]) on 10/17/2015 4:13:27 PM     Case discussed with Dr. Nicole Kindred via telephone who requested the  contact neurosurgeon after review of scan. 9:20 PM patient is alert feels well to go home Glasgow Coma Score 15 Results for orders placed or performed during the hospital encounter of 10/17/15  Protime-INR  Result Value Ref Range   Prothrombin Time 14.6 11.6 - 15.2 seconds   INR 1.12 0.00 - 1.49  APTT  Result Value Ref Range   aPTT 35 24 - 37 seconds  CBC  Result Value Ref Range   WBC 7.6 4.0 - 10.5 K/uL   RBC 4.46 4.22 - 5.81 MIL/uL   Hemoglobin 13.8 13.0 - 17.0 g/dL   HCT 41.5 39.0 - 52.0 %   MCV 93.0 78.0 - 100.0 fL   MCH 30.9 26.0 - 34.0 pg   MCHC 33.3 30.0 - 36.0 g/dL   RDW 12.4 11.5 - 15.5 %   Platelets 205 150 - 400 K/uL  Differential  Result Value Ref Range   Neutrophils Relative % 68 %   Neutro Abs 5.1 1.7 - 7.7 K/uL   Lymphocytes Relative 18 %   Lymphs Abs 1.4 0.7 - 4.0 K/uL   Monocytes Relative 9 %   Monocytes Absolute 0.7 0.1 - 1.0 K/uL   Eosinophils Relative 4 %   Eosinophils Absolute 0.3 0.0 - 0.7 K/uL   Basophils Relative 1 %   Basophils Absolute 0.0 0.0 - 0.1 K/uL  Comprehensive metabolic panel  Result Value Ref Range   Sodium 137 135 - 145 mmol/L   Potassium 3.9 3.5 - 5.1 mmol/L   Chloride 103 101 - 111 mmol/L   CO2 26 22 - 32 mmol/L   Glucose, Bld 141 (H) 65 - 99 mg/dL   BUN 18 6 - 20 mg/dL   Creatinine, Ser 0.99 0.61 - 1.24 mg/dL   Calcium 9.2 8.9 - 10.3 mg/dL   Total Protein 6.5 6.5 - 8.1 g/dL   Albumin 3.7 3.5 - 5.0 g/dL   AST  28 15 - 41 U/L   ALT 26 17 - 63 U/L   Alkaline Phosphatase 51 38 - 126 U/L   Total Bilirubin 1.1 0.3 - 1.2 mg/dL   GFR calc non Af Amer >60 >60 mL/min   GFR calc Af Amer >60 >60 mL/min   Anion gap 8 5 - 15  Urine rapid drug screen (hosp performed)not at [facility]  Result Value Ref Range   Opiates NONE DETECTED NONE DETECTED   Cocaine NONE DETECTED NONE DETECTED   Benzodiazepines NONE DETECTED NONE DETECTED   Amphetamines NONE DETECTED NONE DETECTED   Tetrahydrocannabinol NONE DETECTED NONE DETECTED   Barbiturates NONE DETECTED NONE DETECTED  Urinalysis, Routine w reflex microscopic (not at [facility])  Result Value Ref Range   Color, Urine YELLOW YELLOW   APPearance CLEAR CLEAR   Specific Gravity, Urine 1.021 1.005 - 1.030   pH 7.0 5.0 - 8.0   Glucose, UA NEGATIVE NEGATIVE mg/dL   Hgb urine dipstick NEGATIVE NEGATIVE   Bilirubin Urine NEGATIVE NEGATIVE   Ketones, ur NEGATIVE NEGATIVE mg/dL   Protein, ur NEGATIVE NEGATIVE mg/dL   Nitrite NEGATIVE NEGATIVE   Leukocytes, UA NEGATIVE NEGATIVE  I-Stat Chem 8, ED  (not at [facility], [facility])  Result Value Ref Range   Sodium 138 135 - 145 mmol/L   Potassium 3.8 3.5 - 5.1 mmol/L   Chloride 99 (L) 101 - 111 mmol/L   BUN 21 (H) 6 - 20 mg/dL   Creatinine, Ser 0.90 0.61 - 1.24 mg/dL   Glucose, Bld 133 (H) 65 - 99 mg/dL   Calcium,  Ion 1.19 1.13 - 1.30 mmol/L   TCO2 26 0 - 100 mmol/L   Hemoglobin 14.3 13.0 - 17.0 g/dL   HCT 42.0 39.0 - 52.0 %  I-stat troponin, ED (not at [facility], [facility])  Result Value Ref Range   Troponin i, poc 0.00 0.00 - 0.08 ng/mL   Comment 3           Ct Head Wo Contrast  10/17/2015  CLINICAL DATA:  Confusion, dizziness, fall EXAM: CT HEAD WITHOUT CONTRAST TECHNIQUE: Contiguous axial images were obtained from the base of the skull through the vertex without intravenous contrast. COMPARISON:  Brain MRI 02/28/2011 FINDINGS: No intracranial hemorrhage, or midline shift. There is focal area of decreased attenuation in left temporal lobe.  Measures about 2.2 cm. Vasogenic edema or white matter demyelination cannot be excluded. Less likely cortical infarct. Further correlation with MRI is recommended. Mild cerebral atrophy. Mild periventricular white matter decreased attenuation probable due to chronic small vessel ischemic changes. Vascular: Atherosclerotic calcifications of carotid siphon are noted. Skull: No skull fracture is noted. Sinuses/Orbits: Paranasal sinuses and mastoid air cells are unremarkable. Other: None IMPRESSION: No intracranial hemorrhage, or midline shift. There is focal area of decreased attenuation in left temporal lobe. Measures about 2.2 cm. Vasogenic edema or white matter demyelination cannot be excluded. Less likely cortical infarct. Further correlation with MRI is recommended. Mild cerebral atrophy. Mild periventricular white matter decreased attenuation probable due to chronic small vessel ischemic changes. Electronically Signed   By: Lahoma Crocker M.D.   On: 10/17/2015 16:43   Mr Jeri Cos [id number] Contrast  10/17/2015  CLINICAL DATA:  Fall to the left. Abnormal CT of the head. Confusion. Dizziness. EXAM: MRI HEAD WITHOUT AND WITH CONTRAST TECHNIQUE: Multiplanar, multiecho pulse sequences of the brain and surrounding structures were obtained without and with intravenous contrast. CONTRAST:  44mL MULTIHANCE GADOBENATE DIMEGLUMINE 529 MG/ML IV SOLN COMPARISON:  CT head without contrast from the same day. MRI brain 02/28/2011 FINDINGS: Abnormal T2 signal is again noted within the left temporal tip. An enhancing dural-based mass lesion medial to the left temporal tip along the lateral aspect of the cavernous sinus measures 12 x 12 x 7 mm. There is no evidence for invasion of the cavernous sinus. The pituitary gland is normal. No other acute infarct, hemorrhage, or mass lesion is present. Moderate generalized atrophy is likely within normal limits for age. Dilated perivascular spaces are present in the basal ganglia bilaterally without  significant infarcts. The brainstem and cerebellum are normal. Flow is present in the major intracranial arteries. Focal T1 shortening is evident within the left petrous apex compatible with a cholesterol granuloma. Bilateral lens replacements are present. The paranasal sinuses and mastoid air cells are clear. IMPRESSION: 1. 12 mm meningioma along the medial aspect of the left middle cranial fossa with significant edema evident within the left temporal tip. 2. Left petrous apex cholesterol granuloma. 3. Mild generalized atrophy is likely within normal limits for age. Electronically Signed   By: San Morelle M.D.   On: 10/17/2015 19:53    MDM  I discussed case with Dr. Sherley Bounds who reviewed MRI scan feels the patient is okay for discharge with dose of Decadron 10 mg IM and prescription for Medrol Dosepak. He wants to see patient in his office within the next 1-2 weeks.Blood pressure recheck 1 or 2 weeks Final diagnoses:  None  Diagnosis #1 fall #2 meningioma #3elevated blood pressure     Orlie Dakin, MD 10/17/15 2125  Orlie Dakin,  MD 10/17/15 2126  Orlie Dakin, MD 10/25/15 1507

## 2015-10-17 NOTE — Discharge Instructions (Signed)
Meningioma Take the medication prescribed starting tomorrow. Call Dr. Adah Salvage office tomorrow to arrange to be seen in one or 2 weeks. Your blood pressure tonight was elevated at 172/66. Get  It rechecked in one or 2 weeks. Return if you feel worse for any reason. Meningioma is a tumor that occurs in the thin tissue that covers the brain and spinal cord (meninges). Meningiomas are usually benign, which means they are not cancerous and do not spread to other areas. In rare cases, a meningioma may become cancerous (malignant). Older women have a higher risk of having meningiomas. However, men have a higher risk of having a meningioma that is malignant. RISK FACTORS People who have had radiation exposure in the past may have an increased risk of developing this type of tumor. People who have neurofibromatosis 2 may also have an increased risk of meningioma. Older women have a higher risk of meningiomas than men or children. However, men have a higher risk of meningiomas that are malignant. SIGNS AND SYMPTOMS Symptoms of meningioma usually begin very slowly. The symptoms may depend on the size and location of the tumor. Possible symptoms include:   Headaches.  Nausea and vomiting.  Vision changes.  Hearing changes.   Loss of the sense of smell.  Seizures.   Weakness or numbness on one side of the body or in an arm or leg.   Mood changes.   Problems with memory or thinking.  DIAGNOSIS  Brain tumors can usually be seen on brain imaging, such as CT scan or MRI. A sample of the tumor will need to be studied in a laboratory (biopsy) to confirm the diagnosis of meningioma. Information about the tumor cells also helps guide treatment. TREATMENT  Because meningioma is so slow growing, treatment is often delayed until symptoms affect daily activities. Regular monitoring is performed to track the tumor's growth.  There are several ways that meningioma is treated:  Surgery to remove as much  of the tumor as possible.  High-energy rays (radiation therapy) to help shrink or kill the tumor.  Chemotherapy to shrink or kill the tumor. Because normal cells may also be killed, chemotherapy has many side effects.  Targeted therapy, using substances that injure or kill cancer cells without affecting normal cells.  Steroid medicine to decrease brain swelling and improve symptoms. HOME CARE INSTRUCTIONS  Take all medicines as directed by your health care provider.  Go to all follow-up appointments. SEEK MEDICAL CARE IF:  Any symptoms come back.  You have diarrhea, throw up (vomit), or have abdominal pain.  You cannot eat or drink as much as you need.  You are more weak or tired than usual.   You are losing weight without trying. SEEK IMMEDIATE MEDICAL CARE IF:  Your diarrhea, vomiting, or abdominal pain does not go away.  You have new symptoms, such as vision problems or difficulty walking.   You have a seizure.   You have bleeding that does not stop.   You have trouble breathing.   You have a fever.    This information is not intended to replace advice given to you by your health care provider. Make sure you discuss any questions you have with your health care provider.   Document Released: 06/07/2013 Document Reviewed: 06/07/2013 Elsevier Interactive Patient Education [employer].

## 2015-10-17 NOTE — ED Notes (Signed)
MD at bedside. 

## 2015-10-17 NOTE — ED Notes (Signed)
Per patients neighbor, pt found at the end of his driveway and stated that he was going to fall due to dizziness. Pt fell into the bushes. Pt did not hit head, denies pain at this time. No obvious injuries at this time. Pt feels "washed out". Pt has equal grip strength, no facial droop, no obvious deficits at this time. Pt AAOX4, answers all orientation questions correctly. Pt still feels dizzy at this time. Hx of TIA. UNknown LSN due to patient being found by neighbor.

## 2015-10-18 ENCOUNTER — Telehealth: Payer: Self-pay | Admitting: *Deleted

## 2015-10-18 ENCOUNTER — Other Ambulatory Visit: Payer: Medicare HMO

## 2015-10-18 NOTE — Telephone Encounter (Signed)
Pt called stating he did not have Rx from yesterday's visit.  Pt stated he had packet in hand.  EDCM directed pt to located bluish/purple paper that was Rx.  Pt found it promptly and will take to pharmacy.

## 2015-10-19 DIAGNOSIS — D320 Benign neoplasm of cerebral meninges: Secondary | ICD-10-CM | POA: Diagnosis not present

## 2015-10-19 DIAGNOSIS — I10 Essential (primary) hypertension: Secondary | ICD-10-CM | POA: Diagnosis not present

## 2015-10-19 DIAGNOSIS — Z0000 Encounter for general adult medical examination without abnormal findings: Secondary | ICD-10-CM | POA: Diagnosis not present

## 2015-10-19 DIAGNOSIS — E785 Hyperlipidemia, unspecified: Secondary | ICD-10-CM | POA: Diagnosis not present

## 2015-10-24 DIAGNOSIS — G939 Disorder of brain, unspecified: Secondary | ICD-10-CM | POA: Diagnosis not present

## 2015-10-29 ENCOUNTER — Ambulatory Visit: Payer: Medicare HMO | Admitting: Cardiovascular Disease

## 2015-10-30 ENCOUNTER — Other Ambulatory Visit (INDEPENDENT_AMBULATORY_CARE_PROVIDER_SITE_OTHER): Payer: Medicare HMO | Admitting: *Deleted

## 2015-10-30 DIAGNOSIS — E785 Hyperlipidemia, unspecified: Secondary | ICD-10-CM | POA: Diagnosis not present

## 2015-10-30 DIAGNOSIS — I2583 Coronary atherosclerosis due to lipid rich plaque: Secondary | ICD-10-CM | POA: Diagnosis not present

## 2015-10-30 DIAGNOSIS — I10 Essential (primary) hypertension: Secondary | ICD-10-CM | POA: Diagnosis not present

## 2015-10-30 DIAGNOSIS — I2510 Atherosclerotic heart disease of native coronary artery without angina pectoris: Secondary | ICD-10-CM

## 2015-10-30 LAB — LIPID PANEL
Cholesterol: 156 mg/dL (ref 125–200)
HDL: 72 mg/dL (ref >=40)
LDL Cholesterol: 73 mg/dL (ref <130)
Total CHOL/HDL Ratio: 2.2 Ratio (ref <=5.0)
Triglycerides: 53 mg/dL (ref <150)
VLDL: 11 mg/dL (ref <30)

## 2015-10-30 LAB — HEPATIC FUNCTION PANEL
ALT: 22 U/L (ref 9–46)
AST: 22 U/L (ref 10–35)
Albumin: 4 g/dL (ref 3.6–5.1)
Alkaline Phosphatase: 48 U/L (ref 40–115)
Bilirubin, Direct: 0.2 mg/dL (ref <=0.2)
Indirect Bilirubin: 0.9 mg/dL (ref 0.2–1.2)
Total Bilirubin: 1.1 mg/dL (ref 0.2–1.2)
Total Protein: 6.3 g/dL (ref 6.1–8.1)

## 2015-10-30 LAB — BASIC METABOLIC PANEL
BUN: 22 mg/dL (ref 7–25)
CO2: 29 mmol/L (ref 20–31)
Calcium: 9.4 mg/dL (ref 8.6–10.3)
Chloride: 101 mmol/L (ref 98–110)
Creat: 1.02 mg/dL (ref 0.70–1.11)
Glucose, Bld: 94 mg/dL (ref 65–99)
Potassium: 3.8 mmol/L (ref 3.5–5.3)
Sodium: 138 mmol/L (ref 135–146)

## 2015-10-30 NOTE — Addendum Note (Signed)
Addended by: Eulis Foster on: 10/30/2015 07:37 AM   Modules accepted: Orders

## 2015-11-07 ENCOUNTER — Ambulatory Visit (INDEPENDENT_AMBULATORY_CARE_PROVIDER_SITE_OTHER): Payer: Medicare HMO | Admitting: Cardiovascular Disease

## 2015-11-07 ENCOUNTER — Encounter: Payer: Self-pay | Admitting: Cardiovascular Disease

## 2015-11-07 VITALS — BP 120/60 | HR 60 | Ht 67.0 in | Wt 175.4 lb

## 2015-11-07 DIAGNOSIS — I10 Essential (primary) hypertension: Secondary | ICD-10-CM | POA: Diagnosis not present

## 2015-11-07 DIAGNOSIS — I5032 Chronic diastolic (congestive) heart failure: Secondary | ICD-10-CM | POA: Diagnosis not present

## 2015-11-07 NOTE — Progress Notes (Signed)
Cardiology Office Note   Date:  11/07/2015   ID:  [name] [name], DOB [birth year], MRN [id number]  PCP:  [name], MD  Cardiologist:   Mertie Moores, MD   Chief Complaint  Patient presents with  . Follow-up    chronic diastolic CHF   1. Moderate coronary artery disease by heart catheterization 20 years ago 2. Hyperlipidemia 3. Hypertension 4. Mild AI 5, Mild TR  History of Present Illness:  [name] [name] is a 82 y.o. gentleman with the above noted hx. He still has some ankle edema that he is concerned about. He has not had any chest pain. He is still very active and healthy.  Dec. 23, 2014:  [name] [name] has done well. No CP. No dyspnea or syncope. Still moderately active. He walks some. He does some stretching. He wants to start bowling again. He does have some symptoms when he first lies down or sits up. ? Vertigo vs. orthostasis hyotension.   03/28/2014:  [name] [name] is doing ok His wife died last year.  He stopped his amlodipine earlier this year - insurance would not pay for it so he stopped it. BP has been OK since that time Tries to watch his salt. Still eats canned veggies and other salty food.    Oct 20, 2014:  [name] [name] is a 82 y.o. male who presents for  Follow up for his CHf  May 24 ,2017:  [name] [name] is seen for his HTN and chronic diastolic CHF No CP or dyspnea.  Had some mid sternal CP several weeks ago - has resolved now    Past Medical History  Diagnosis Date  . Hyperlipidemia   . Hypertension   . Headache(784.0)   . Internal hemorrhoids 2009    Dr. Lajoyce Corners Colonoscopy  . History of colon polyps 2009    Dr. Orr/Colonoscopy -small cecal adenoma  . H. pylori infection     Hx of   . Hx of colonoscopy 2009    Dr Lajoyce Corners, hemorrhoids & polyps  . Coronary artery disease     MODERATE  . Mitral valve regurgitation   . Edema of lower extremity   . Allergic rhinitis   . Erectile dysfunction   . Amnesia    . Reflux esophagitis   . Dizziness   . Bradycardia   . Hard of hearing   . Colon polyps     Past Surgical History  Procedure Laterality Date  . Cardiac catheterization  1997    REVEALED MILD TO MODERATE IRREGULARITIES. HIS LEFT EVNTRICULAR SYSTOLIC FUNCTION REVEALED NORMAL EJECTION FRACTION WITH EF OF 70%  . Appendectomy    . Inguinal hernia repair Bilateral 2006  . Tonsillectomy    . Hemorroidectomy    . Lower back surgery  2006  . Colonoscopy  multiple  . Upper gastrointestinal endoscopy    . Hip arthroplasty Left 08/17/2013    Procedure: ARTHROPLASTY BIPOLAR HIP;  Surgeon: Gearlean Alf, MD;  Location: [facility];  Service: Orthopedics;  Laterality: Left;  . Cataract extraction, bilateral    . Nasal septum surgery       Current Outpatient Prescriptions  Medication Sig Dispense Refill  . amLODipine (NORVASC) 5 MG tablet Take 5 mg by mouth daily.    Marland Kitchen aspirin EC 81 MG tablet Take 1 tablet (81 mg total) by mouth daily.    Marland Kitchen atorvastatin (LIPITOR) 20 MG tablet TAKE ONE TABLET BY MOUTH EVERY DAY (Patient taking differently: 3 x a week) 90 tablet  3  . Cholecalciferol (VITAMIN D3) 1000 UNITS CAPS Take by mouth daily.      . furosemide (LASIX) 20 MG tablet Take 40 mg by mouth daily.     Marland Kitchen KRILL OIL PO Take 1 capsule by mouth 1 day or 1 dose.     Marland Kitchen lisinopril-hydrochlorothiazide (PRINZIDE,ZESTORETIC) 20-25 MG per tablet Take 1 tablet by mouth daily.      . methylPREDNISolone (MEDROL DOSEPAK) 4 MG TBPK tablet Use as directed 21 tablet 0  . Multiple Vitamin (MULTIVITAMIN) tablet Take 1 tablet by mouth daily.      Marland Kitchen NIACIN PO Take 500 mg by mouth daily. Mon, tues, thurs sat    . vitamin B-12 (CYANOCOBALAMIN) 1000 MCG tablet Take 1,000 mcg by mouth daily.    Marland Kitchen zinc gluconate 50 MG tablet Take 100 mg by mouth 3 (three) times a week. Take on Mon wed and fridays     No current facility-administered medications for this visit.    Allergies:   Review of patient's allergies indicates no  known allergies.    Social History:  The patient  reports that he quit smoking about 40 years ago. He has never used smokeless tobacco. He reports that he drinks about 1.2 oz of alcohol per week. He reports that he does not use illicit drugs.   Family History:  The patient's family history includes Breast cancer in his sister; Colon cancer (age of onset: 81) in his mother.    ROS:  Please see the history of present illness.    Review of Systems: Constitutional:  denies fever, chills, diaphoresis, appetite change and fatigue.  HEENT: denies photophobia, eye pain, redness, hearing loss, ear pain, congestion, sore throat, rhinorrhea, sneezing, neck pain, neck stiffness and tinnitus.  Respiratory: denies SOB, DOE, cough, chest tightness, and wheezing.  Cardiovascular: denies chest pain, palpitations and leg swelling.  Gastrointestinal: denies nausea, vomiting, abdominal pain, diarrhea, constipation, blood in stool.  Genitourinary: denies dysuria, urgency, frequency, hematuria, flank pain and difficulty urinating.  Musculoskeletal: denies  myalgias, back pain, joint swelling, arthralgias and gait problem.   Skin: denies pallor, rash and wound.  Neurological: denies dizziness, seizures, syncope, weakness, light-headedness, numbness and headaches.   Hematological: denies adenopathy, easy bruising, personal or family bleeding history.  Psychiatric/ Behavioral: denies suicidal ideation, mood changes, confusion, nervousness, sleep disturbance and agitation.       All other systems are reviewed and negative.    PHYSICAL EXAM: VS:  BP 120/60 mmHg  Pulse 60  Ht 5\' 7"  (1.702 m)  Wt 175 lb 6.4 oz (79.561 kg)  BMI 27.47 kg/m2 , BMI Body mass index is 27.47 kg/(m^2). GEN: Well nourished, well developed, in no acute distress HEENT: normal Neck: no JVD, carotid bruits, or masses Cardiac: RRR; no murmurs, rubs, or gallops,no edema  Respiratory:  clear to auscultation bilaterally, normal work of  breathing GI: soft, nontender, nondistended, + BS MS: no deformity or atrophy Skin: warm and dry, no rash Neuro:  Strength and sensation are intact Psych: normal   EKG:  EKG is ordered today. The ekg ordered today demonstrates  NSR at 70 .  Occasional PVCs    Recent Labs: 10/17/2015: Hemoglobin 14.3; Platelets 205 10/30/2015: ALT 22; BUN 22; Creat 1.02; Potassium 3.8; Sodium 138    Lipid Panel    Component Value Date/Time   CHOL 156 10/30/2015 0737   TRIG 53 10/30/2015 0737   HDL 72 10/30/2015 0737   CHOLHDL 2.2 10/30/2015 0737   VLDL 11 10/30/2015 0737  [place] 73 10/30/2015 0737      Wt Readings from Last 3 Encounters:  11/07/15 175 lb 6.4 oz (79.561 kg)  04/13/15 169 lb (76.658 kg)  10/20/14 175 lb (79.379 kg)      Other studies Reviewed: Additional studies/ records that were reviewed today include: . Review of the above records demonstrates:    ASSESSMENT AND PLAN:  1. Moderate coronary artery disease by heart catheterization 20 years ago 2. Hyperlipidemia- his lipid levels from last week look very stable. Continue current medications.  3. Hypertension - his blood pressure remains well-controlled. Continue current medications.  4. Balance issues:  Has loss of balance frequently .  Was seen in the ER.   MRI shows a benign tumur.  .   Is to have a repeat study in 3 months   5. Mild AI 6, Mild TR 7. Chronic diastolic congestive heart failure-he remains very stable. Continue current medications.   Current medicines are reviewed at length with the patient today.  The patient does not have concerns regarding medicines.  The following changes have been made:  no change  Labs/ tests ordered today include:  No orders of the defined types were placed in this encounter.    Continue same meds.  Disposition:   FU with me in 1 year     Mertie Moores, MD  11/07/2015 10:37 AM    [facility] [place], [place], [place]  [address] Phone:  [phone]; Fax: [phone]   [facility]  [address] [place], [place]  [address] [phone]    Fax [phone]

## 2015-11-07 NOTE — Patient Instructions (Signed)
Medication Instructions:  Your physician recommends that you continue on your current medications as directed. Please refer to the Current Medication list given to you today.   Labwork: Your physician recommends that you return for lab work in: 1 year on the day of or a few days before your office visit with Dr. [name].  You will need to FAST for this appointment - nothing to eat or drink after midnight the night before except water.   Testing/Procedures: None Ordered   Follow-Up: Your physician wants you to follow-up in: 1 year with Dr. [name].  You will receive a reminder letter in the mail two months in advance. If you don't receive a letter, please call our office to schedule the follow-up appointment.   If you need a refill on your cardiac medications before your next appointment, please call your pharmacy.   Thank you for choosing [facility]! Michelle Swinyer, RN [phone]    

## 2016-01-09 DIAGNOSIS — I10 Essential (primary) hypertension: Secondary | ICD-10-CM | POA: Diagnosis not present

## 2016-01-17 DIAGNOSIS — G939 Disorder of brain, unspecified: Secondary | ICD-10-CM | POA: Diagnosis not present

## 2016-01-25 DIAGNOSIS — G939 Disorder of brain, unspecified: Secondary | ICD-10-CM | POA: Diagnosis not present

## 2016-01-25 DIAGNOSIS — D329 Benign neoplasm of meninges, unspecified: Secondary | ICD-10-CM | POA: Diagnosis not present

## 2016-02-08 DIAGNOSIS — G939 Disorder of brain, unspecified: Secondary | ICD-10-CM | POA: Diagnosis not present

## 2016-03-13 DIAGNOSIS — I10 Essential (primary) hypertension: Secondary | ICD-10-CM | POA: Diagnosis not present

## 2016-03-13 DIAGNOSIS — M6281 Muscle weakness (generalized): Secondary | ICD-10-CM | POA: Diagnosis not present

## 2016-03-13 DIAGNOSIS — R278 Other lack of coordination: Secondary | ICD-10-CM | POA: Diagnosis not present

## 2016-03-13 DIAGNOSIS — R2681 Unsteadiness on feet: Secondary | ICD-10-CM | POA: Diagnosis not present

## 2016-03-13 DIAGNOSIS — Z9181 History of falling: Secondary | ICD-10-CM | POA: Diagnosis not present

## 2016-03-13 DIAGNOSIS — N400 Enlarged prostate without lower urinary tract symptoms: Secondary | ICD-10-CM | POA: Diagnosis not present

## 2016-03-13 DIAGNOSIS — J3089 Other allergic rhinitis: Secondary | ICD-10-CM | POA: Diagnosis not present

## 2016-03-13 DIAGNOSIS — R41841 Cognitive communication deficit: Secondary | ICD-10-CM | POA: Diagnosis not present

## 2016-03-13 DIAGNOSIS — N3941 Urge incontinence: Secondary | ICD-10-CM | POA: Diagnosis not present

## 2016-03-20 DIAGNOSIS — I10 Essential (primary) hypertension: Secondary | ICD-10-CM | POA: Diagnosis not present

## 2016-03-20 DIAGNOSIS — E785 Hyperlipidemia, unspecified: Secondary | ICD-10-CM | POA: Diagnosis not present

## 2016-03-20 DIAGNOSIS — Z0000 Encounter for general adult medical examination without abnormal findings: Secondary | ICD-10-CM | POA: Diagnosis not present

## 2016-03-20 DIAGNOSIS — Z23 Encounter for immunization: Secondary | ICD-10-CM | POA: Diagnosis not present

## 2016-03-24 DIAGNOSIS — Z0001 Encounter for general adult medical examination with abnormal findings: Secondary | ICD-10-CM | POA: Diagnosis not present

## 2016-03-24 DIAGNOSIS — Z8719 Personal history of other diseases of the digestive system: Secondary | ICD-10-CM | POA: Diagnosis not present

## 2016-03-24 DIAGNOSIS — E785 Hyperlipidemia, unspecified: Secondary | ICD-10-CM | POA: Diagnosis not present

## 2016-03-24 DIAGNOSIS — R600 Localized edema: Secondary | ICD-10-CM | POA: Diagnosis not present

## 2016-04-10 DIAGNOSIS — D3131 Benign neoplasm of right choroid: Secondary | ICD-10-CM | POA: Diagnosis not present

## 2016-04-10 DIAGNOSIS — H43811 Vitreous degeneration, right eye: Secondary | ICD-10-CM | POA: Diagnosis not present

## 2016-04-10 DIAGNOSIS — H35363 Drusen (degenerative) of macula, bilateral: Secondary | ICD-10-CM | POA: Diagnosis not present

## 2016-04-10 DIAGNOSIS — Z961 Presence of intraocular lens: Secondary | ICD-10-CM | POA: Diagnosis not present

## 2016-05-02 DIAGNOSIS — S92353A Displaced fracture of fifth metatarsal bone, unspecified foot, initial encounter for closed fracture: Secondary | ICD-10-CM | POA: Diagnosis not present

## 2016-05-02 DIAGNOSIS — S9031XA Contusion of right foot, initial encounter: Secondary | ICD-10-CM | POA: Diagnosis not present

## 2016-05-02 DIAGNOSIS — W19XXXA Unspecified fall, initial encounter: Secondary | ICD-10-CM | POA: Diagnosis not present

## 2016-05-03 DIAGNOSIS — S92354A Nondisplaced fracture of fifth metatarsal bone, right foot, initial encounter for closed fracture: Secondary | ICD-10-CM | POA: Diagnosis not present

## 2016-05-13 DIAGNOSIS — G939 Disorder of brain, unspecified: Secondary | ICD-10-CM | POA: Diagnosis not present

## 2016-07-11 ENCOUNTER — Ambulatory Visit (INDEPENDENT_AMBULATORY_CARE_PROVIDER_SITE_OTHER): Payer: Medicare HMO | Admitting: Family Medicine

## 2016-07-11 VITALS — BP 132/58 | HR 59 | Temp 97.6°F | Ht 67.0 in | Wt 172.2 lb

## 2016-07-11 DIAGNOSIS — H6123 Impacted cerumen, bilateral: Secondary | ICD-10-CM | POA: Diagnosis not present

## 2016-07-11 NOTE — Progress Notes (Signed)
Patient ID: [name] [name], male    DOB: 1924/12/10, 83 y.o.   MRN: [id number]  PCP: [name], MD  Chief Complaint  Patient presents with  . Cerumen Impaction    Subjective:  HPI 83 year old male presents for evaluation of cerumen impaction. Referral from [facility] due to ear wax impaction. Left ear is worst than right. Last ear wax removal occurred 2 years ago.  Social History   Social History  . Marital status: Widowed    Spouse name: N/A  . Number of children: N/A  . Years of education: N/A   Occupational History  . Not on file.   Social History Main Topics  . Smoking status: Former Smoker    Quit date: 10/25/1975  . Smokeless tobacco: Never Used  . Alcohol use 1.2 oz/week    1 Glasses of wine, 1 Cans of beer per week     Comment: occ  . Drug use: No  . Sexual activity: Not on file   Other Topics Concern  . Not on file   Social History Narrative  . No narrative on file    Family History  Problem Relation Age of Onset  . Colon cancer Mother 28    second cancer in 18's deceased after emergency surgery  . Breast cancer Sister    Review of Systems See HPI  Patient Active Problem List   Diagnosis Date Noted  . Chronic diastolic heart failure ([place]) 08/17/2013  . Femur fracture, left ([place]) 08/16/2013  . Bradycardia, severe sinus 10/30/2011  . Personal history of colonic adenoma 01/09/2011  . Family history of malignant neoplasm of colon cancer-mother 01/09/2011  . Coronary artery disease   . Hyperlipidemia   . Hypertension   . Headache(784.0)     No Known Allergies  Prior to Admission medications   Medication Sig Start Date End Date Taking? Authorizing Provider  amLODipine (NORVASC) 5 MG tablet Take 5 mg by mouth daily.   Yes Historical Provider, MD  aspirin EC 81 MG tablet Take 1 tablet (81 mg total) by mouth daily. 08/19/13  Yes Kinnie Feil, MD  atorvastatin (LIPITOR) 20 MG tablet TAKE ONE TABLET BY MOUTH EVERY  DAY Patient taking differently: 3 x a week 11/14/14  Yes Thayer Headings, MD  Cholecalciferol (VITAMIN D3) 1000 UNITS CAPS Take by mouth daily.     Yes Historical Provider, MD  furosemide (LASIX) 20 MG tablet Take 40 mg by mouth daily.    Yes Historical Provider, MD  KRILL OIL PO Take 1 capsule by mouth 1 day or 1 dose.    Yes Historical Provider, MD  lisinopril-hydrochlorothiazide (PRINZIDE,ZESTORETIC) 20-25 MG per tablet Take 1 tablet by mouth daily.     Yes Historical Provider, MD  Multiple Vitamin (MULTIVITAMIN) tablet Take 1 tablet by mouth daily.     Yes Historical Provider, MD  NIACIN PO Take 500 mg by mouth daily. Mon, tues, thurs sat   Yes Historical Provider, MD  vitamin B-12 (CYANOCOBALAMIN) 1000 MCG tablet Take 1,000 mcg by mouth daily.   Yes Historical Provider, MD  zinc gluconate 50 MG tablet Take 100 mg by mouth 3 (three) times a week. Take on Mon wed and fridays   Yes Historical Provider, MD  methylPREDNISolone (MEDROL DOSEPAK) 4 MG TBPK tablet Use as directed Patient not taking: Reported on 07/11/2016 10/17/15   Orlie Dakin, MD    Past Medical, Surgical Family and Social History reviewed and updated.    Objective:  Today's Vitals   07/11/16 0926  BP: (!) 132/58  Pulse: (!) 59  Temp: 97.6 F (36.4 C)  TempSrc: Oral  SpO2: 99%  Weight: 172 lb 3.2 oz (78.1 kg)  Height: 5\' 7"  (1.702 m)    Wt Readings from Last 3 Encounters:  07/11/16 172 lb 3.2 oz (78.1 kg)  11/07/15 175 lb 6.4 oz (79.6 kg)  04/13/15 169 lb (76.7 kg)   Physical Exam  Constitutional: He is oriented to person, place, and time. He appears well-developed and well-nourished.  HENT:  Head: Normocephalic and atraumatic.  Bilaterally thickened with darken cerumen bilateral ears.  Cardiovascular: Normal rate, regular rhythm, normal heart sounds and intact distal pulses.   Pulmonary/Chest: Effort normal and breath sounds normal.  Neurological: He is alert and oriented to person, place, and time.  Skin:  Skin is warm and dry.  Psychiatric: He has a normal mood and affect. His behavior is normal. Judgment and thought content normal.      Assessment & Plan:  1. Impacted cerumen of both ears -Ear Wax Removal   Carroll Sage. Kenton Kingfisher, MSN, FNP-C [facility]

## 2016-07-11 NOTE — Patient Instructions (Addendum)
     IF you received an x-ray today, you will receive an invoice from [facility]. Please contact [facility] at [phone] with questions or concerns regarding your invoice.   IF you received labwork today, you will receive an invoice from [facility]. Please contact [facility] at [phone] with questions or concerns regarding your invoice.   Our billing staff will not be able to assist you with questions regarding bills from these companies.  You will be contacted with the lab results as soon as they are available. The fastest way to get your results is to activate your My Chart account. Instructions are located on the last page of this paperwork. If you have not heard from us regarding the results in 2 weeks, please contact this office.     

## 2016-07-18 DIAGNOSIS — G939 Disorder of brain, unspecified: Secondary | ICD-10-CM | POA: Diagnosis not present

## 2016-07-23 DIAGNOSIS — D320 Benign neoplasm of cerebral meninges: Secondary | ICD-10-CM | POA: Diagnosis not present

## 2016-07-23 DIAGNOSIS — G939 Disorder of brain, unspecified: Secondary | ICD-10-CM | POA: Diagnosis not present

## 2016-07-28 DIAGNOSIS — I10 Essential (primary) hypertension: Secondary | ICD-10-CM | POA: Diagnosis not present

## 2016-07-28 DIAGNOSIS — Z7982 Long term (current) use of aspirin: Secondary | ICD-10-CM | POA: Diagnosis not present

## 2016-07-28 DIAGNOSIS — Z9181 History of falling: Secondary | ICD-10-CM | POA: Diagnosis not present

## 2016-07-28 DIAGNOSIS — Z6825 Body mass index (BMI) 25.0-25.9, adult: Secondary | ICD-10-CM | POA: Diagnosis not present

## 2016-07-28 DIAGNOSIS — R600 Localized edema: Secondary | ICD-10-CM | POA: Diagnosis not present

## 2016-07-28 DIAGNOSIS — Z0000 Encounter for general adult medical examination without abnormal findings: Secondary | ICD-10-CM | POA: Diagnosis not present

## 2016-07-28 DIAGNOSIS — D320 Benign neoplasm of cerebral meninges: Secondary | ICD-10-CM | POA: Diagnosis not present

## 2016-07-28 DIAGNOSIS — E785 Hyperlipidemia, unspecified: Secondary | ICD-10-CM | POA: Diagnosis not present

## 2016-07-28 DIAGNOSIS — Z87891 Personal history of nicotine dependence: Secondary | ICD-10-CM | POA: Diagnosis not present

## 2016-08-11 DIAGNOSIS — G939 Disorder of brain, unspecified: Secondary | ICD-10-CM | POA: Diagnosis not present

## 2016-08-28 DIAGNOSIS — N390 Urinary tract infection, site not specified: Secondary | ICD-10-CM | POA: Diagnosis not present

## 2016-08-28 DIAGNOSIS — N3001 Acute cystitis with hematuria: Secondary | ICD-10-CM | POA: Diagnosis not present

## 2016-08-28 DIAGNOSIS — R358 Other polyuria: Secondary | ICD-10-CM | POA: Diagnosis not present

## 2016-10-07 ENCOUNTER — Emergency Department (HOSPITAL_COMMUNITY)
Admission: EM | Admit: 2016-10-07 | Discharge: 2016-10-08 | Disposition: A | Discharge status: Home / Self Care | Payer: Medicare HMO | Attending: Emergency Medicine | Admitting: Emergency Medicine

## 2016-10-07 ENCOUNTER — Emergency Department (HOSPITAL_COMMUNITY): Payer: Medicare HMO

## 2016-10-07 ENCOUNTER — Encounter (HOSPITAL_COMMUNITY): Payer: Self-pay | Admitting: Emergency Medicine

## 2016-10-07 DIAGNOSIS — R509 Fever, unspecified: Secondary | ICD-10-CM | POA: Diagnosis present

## 2016-10-07 DIAGNOSIS — I110 Hypertensive heart disease with heart failure: Secondary | ICD-10-CM | POA: Diagnosis not present

## 2016-10-07 DIAGNOSIS — N12 Tubulo-interstitial nephritis, not specified as acute or chronic: Secondary | ICD-10-CM | POA: Diagnosis not present

## 2016-10-07 DIAGNOSIS — I5032 Chronic diastolic (congestive) heart failure: Secondary | ICD-10-CM | POA: Diagnosis not present

## 2016-10-07 DIAGNOSIS — Z79899 Other long term (current) drug therapy: Secondary | ICD-10-CM | POA: Insufficient documentation

## 2016-10-07 DIAGNOSIS — Z7982 Long term (current) use of aspirin: Secondary | ICD-10-CM | POA: Diagnosis not present

## 2016-10-07 DIAGNOSIS — I2510 Atherosclerotic heart disease of native coronary artery without angina pectoris: Secondary | ICD-10-CM | POA: Diagnosis not present

## 2016-10-07 DIAGNOSIS — R531 Weakness: Secondary | ICD-10-CM | POA: Diagnosis not present

## 2016-10-07 DIAGNOSIS — R404 Transient alteration of awareness: Secondary | ICD-10-CM | POA: Diagnosis not present

## 2016-10-07 DIAGNOSIS — Z87891 Personal history of nicotine dependence: Secondary | ICD-10-CM | POA: Diagnosis not present

## 2016-10-07 DIAGNOSIS — A419 Sepsis, unspecified organism: Secondary | ICD-10-CM | POA: Diagnosis not present

## 2016-10-07 DIAGNOSIS — Z96642 Presence of left artificial hip joint: Secondary | ICD-10-CM | POA: Diagnosis not present

## 2016-10-07 LAB — COMPREHENSIVE METABOLIC PANEL
ALT: 18 U/L (ref 17–63)
AST: 26 U/L (ref 15–41)
Albumin: 3.8 g/dL (ref 3.5–5.0)
Alkaline Phosphatase: 48 U/L (ref 38–126)
Anion gap: 10 (ref 5–15)
BUN: 21 mg/dL — ABNORMAL HIGH (ref 6–20)
CO2: 25 mmol/L (ref 22–32)
Calcium: 9.2 mg/dL (ref 8.9–10.3)
Chloride: 100 mmol/L — ABNORMAL LOW (ref 101–111)
Creatinine, Ser: 1.1 mg/dL (ref 0.61–1.24)
GFR calc Af Amer: >60 mL/min (ref >60)
GFR calc non Af Amer: 57 mL/min — ABNORMAL LOW (ref >60)
Glucose, Bld: 146 mg/dL — ABNORMAL HIGH (ref 65–99)
Potassium: 3.9 mmol/L (ref 3.5–5.1)
Sodium: 135 mmol/L (ref 135–145)
Total Bilirubin: 1.8 mg/dL — ABNORMAL HIGH (ref 0.3–1.2)
Total Protein: 6.7 g/dL (ref 6.5–8.1)

## 2016-10-07 LAB — CBC WITH DIFFERENTIAL/PLATELET
Basophils Absolute: 0 10*3/uL (ref 0.0–0.1)
Basophils Relative: 0 %
Eosinophils Absolute: 0 10*3/uL (ref 0.0–0.7)
Eosinophils Relative: 0 %
HCT: 40.5 % (ref 39.0–52.0)
Hemoglobin: 13.9 g/dL (ref 13.0–17.0)
Lymphocytes Relative: 13 %
Lymphs Abs: 1.7 10*3/uL (ref 0.7–4.0)
MCH: 31.4 pg (ref 26.0–34.0)
MCHC: 34.3 g/dL (ref 30.0–36.0)
MCV: 91.4 fL (ref 78.0–100.0)
Monocytes Absolute: 1.2 10*3/uL — ABNORMAL HIGH (ref 0.1–1.0)
Monocytes Relative: 9 %
Neutro Abs: 10.4 10*3/uL — ABNORMAL HIGH (ref 1.7–7.7)
Neutrophils Relative %: 78 %
Platelets: 194 10*3/uL (ref 150–400)
RBC: 4.43 MIL/uL (ref 4.22–5.81)
RDW: 12.5 % (ref 11.5–15.5)
WBC: 13.3 10*3/uL — ABNORMAL HIGH (ref 4.0–10.5)

## 2016-10-07 LAB — URINALYSIS, ROUTINE W REFLEX MICROSCOPIC
Bilirubin Urine: NEGATIVE
Glucose, UA: NEGATIVE mg/dL
Ketones, ur: 5 mg/dL — AB
Nitrite: POSITIVE — AB
Protein, ur: 100 mg/dL — AB
Specific Gravity, Urine: 1.017 (ref 1.005–1.030)
pH: 5 (ref 5.0–8.0)

## 2016-10-07 LAB — I-STAT CG4 LACTIC ACID, ED: Lactic Acid, Venous: 1.37 mmol/L (ref 0.5–1.9)

## 2016-10-07 MED ORDER — SODIUM CHLORIDE 0.9 % IV BOLUS (SEPSIS)
1000.0000 mL | Freq: Once | INTRAVENOUS | Status: AC
  Administered 2016-10-07: 1000 mL via INTRAVENOUS

## 2016-10-07 MED ORDER — CIPROFLOXACIN HCL 500 MG PO TABS: 500.0000 mg | ORAL_TABLET | Freq: Two times a day (BID) | ORAL | 0 refills | Status: AC

## 2016-10-07 NOTE — ED Triage Notes (Signed)
Per EMS-from [facility] [name] [name]-states has been running fever and feeling weak since Sunday-oral temp was 101.6-given 1000 mg of tylenol in route-fatigue, decreased appetite

## 2016-10-07 NOTE — Discharge Instructions (Signed)
Follow up with urology and your PCP as this is your second UTI here in the past month or so

## 2016-10-07 NOTE — ED Notes (Signed)
[employer], materials contacted for [employer] from [employer] [address] to [place] at [employer] [address]

## 2016-10-07 NOTE — ED Notes (Signed)
I-Stat LacAcid 1.73

## 2016-10-07 NOTE — ED Provider Notes (Signed)
[facility] DEPT Provider Note   CSN: [id number] Arrival date & time: 10/07/16  1749     History   Chief Complaint Chief Complaint  Patient presents with  . Fever    HPI [name] [name] is a 83 y.o. male.  83 yo M with a chief complaints of fever. Going on for the past 4 days. He is felt generally weak with this as well. Denies cough congestion abdominal pain. Has had some urinary hesitancy. Denies dysuria. Denies flank pain.   The history is provided by the patient.  Illness  This is a new problem. The current episode started more than 2 days ago. The problem occurs constantly. The problem has not changed since onset.Pertinent negatives include no chest pain, no abdominal pain, no headaches and no shortness of breath. Nothing aggravates the symptoms. Nothing relieves the symptoms. He has tried nothing for the symptoms. The treatment provided no relief.    Past Medical History:  Diagnosis Date  . Allergic rhinitis   . Amnesia   . Bradycardia   . Colon polyps   . Coronary artery disease    MODERATE  . Dizziness   . Edema of lower extremity   . Erectile dysfunction   . H. pylori infection    Hx of   . Hard of hearing   . Headache(784.0)   . History of colon polyps 2009   Dr. Orr/Colonoscopy -small cecal adenoma  . Hx of colonoscopy 2009   Dr Lajoyce Corners, hemorrhoids & polyps  . Hyperlipidemia   . Hypertension   . Internal hemorrhoids 2009   Dr. Lajoyce Corners Colonoscopy  . Mitral valve regurgitation   . Reflux esophagitis     Patient Active Problem List   Diagnosis Date Noted  . Chronic diastolic heart failure ([place]) 08/17/2013  . Femur fracture, left ([place]) 08/16/2013  . Bradycardia, severe sinus 10/30/2011  . Personal history of colonic adenoma 01/09/2011  . Family history of malignant neoplasm of colon cancer-mother 01/09/2011  . Coronary artery disease   . Hyperlipidemia   . Hypertension   . FWYOVZCH(885.0)     Past Surgical History:  Procedure Laterality Date    . APPENDECTOMY    . CARDIAC CATHETERIZATION  1997   REVEALED MILD TO MODERATE IRREGULARITIES. HIS LEFT EVNTRICULAR SYSTOLIC FUNCTION REVEALED NORMAL EJECTION FRACTION WITH EF OF 70%  . CATARACT EXTRACTION, BILATERAL    . COLONOSCOPY  multiple  . HEMORROIDECTOMY    . HIP ARTHROPLASTY Left 08/17/2013   Procedure: ARTHROPLASTY BIPOLAR HIP;  Surgeon: Gearlean Alf, MD;  Location: [facility];  Service: Orthopedics;  Laterality: Left;  . INGUINAL HERNIA REPAIR Bilateral 2006  . lower back surgery  2006  . NASAL SEPTUM SURGERY    . TONSILLECTOMY    . UPPER GASTROINTESTINAL ENDOSCOPY         Home Medications    Prior to Admission medications   Medication Sig Start Date End Date Taking? Authorizing Provider  amLODipine (NORVASC) 5 MG tablet Take 5 mg by mouth daily.   Yes Historical Provider, MD  aspirin 325 MG EC tablet Take 162.5 mg by mouth daily.   Yes Historical Provider, MD  atorvastatin (LIPITOR) 20 MG tablet TAKE ONE TABLET BY MOUTH EVERY DAY Patient taking differently: TAKE ONE TABLET BY MOUTH 3 TIMES A WEEK 11/14/14  Yes Thayer Headings, MD  Cholecalciferol (VITAMIN D3) 1000 UNITS CAPS Take 1 capsule by mouth daily.    Yes Historical Provider, MD  furosemide (LASIX) 40 MG tablet Take 40  mg by mouth 3 (three) times a week.   Yes Historical Provider, MD  lisinopril-hydrochlorothiazide (PRINZIDE,ZESTORETIC) 20-25 MG tablet Take 1 tablet by mouth daily.   Yes Historical Provider, MD  Multiple Vitamin (MULTIVITAMIN) tablet Take 1 tablet by mouth daily.     Yes Historical Provider, MD  NIACIN PO Take 500 mg by mouth daily. Mon, tues, thurs sat   Yes Historical Provider, MD  vitamin B-12 (CYANOCOBALAMIN) 1000 MCG tablet Take 1,000 mcg by mouth daily.   Yes Historical Provider, MD  zinc gluconate 50 MG tablet Take 100 mg by mouth 3 (three) times a week. Take on Mon wed and fridays   Yes Historical Provider, MD  aspirin EC 81 MG tablet Take 1 tablet (81 mg total) by mouth daily. Patient not  taking: Reported on 10/07/2016 08/19/13   Kinnie Feil, MD  ciprofloxacin (CIPRO) 500 MG tablet Take 1 tablet (500 mg total) by mouth 2 (two) times daily. One po bid x 7 days 10/07/16 10/21/16  Deno Etienne, DO  methylPREDNISolone (MEDROL DOSEPAK) 4 MG TBPK tablet Use as directed Patient not taking: Reported on 07/11/2016 10/17/15   Orlie Dakin, MD    Family History Family History  Problem Relation Age of Onset  . Colon cancer Mother 42    second cancer in 26's deceased after emergency surgery  . Breast cancer Sister     Social History Social History  Substance Use Topics  . Smoking status: Former Smoker    Quit date: 10/25/1975  . Smokeless tobacco: Never Used  . Alcohol use 1.2 oz/week    1 Glasses of wine, 1 Cans of beer per week     Comment: occ     Allergies   Patient has no known allergies.   Review of Systems Review of Systems  Constitutional: Negative for chills and fever.  HENT: Negative for congestion and facial swelling.   Eyes: Negative for discharge and visual disturbance.  Respiratory: Negative for shortness of breath.   Cardiovascular: Negative for chest pain and palpitations.  Gastrointestinal: Negative for abdominal pain, diarrhea, nausea and vomiting.  Musculoskeletal: Negative for arthralgias and myalgias.  Skin: Negative for color change and rash.  Neurological: Positive for weakness. Negative for tremors, syncope and headaches.  Psychiatric/Behavioral: Negative for confusion and dysphoric mood.     Physical Exam Updated Vital Signs BP (!) 151/66 (BP Location: Right Arm)   Pulse 71   Temp 98.3 F (36.8 C) (Oral)   Resp 18   SpO2 98%   Physical Exam  Constitutional: He is oriented to person, place, and time. He appears well-developed and well-nourished.  HENT:  Head: Normocephalic and atraumatic.  Eyes: EOM are normal. Pupils are equal, round, and reactive to light.  Neck: Normal range of motion. Neck supple. No JVD present.  Cardiovascular:  Normal rate and regular rhythm.  Exam reveals no gallop and no friction rub.   No murmur heard. Pulmonary/Chest: No respiratory distress. He has no wheezes.  Abdominal: He exhibits no distension and no mass. There is no tenderness. There is no rebound and no guarding.  Musculoskeletal: Normal range of motion.  Neurological: He is alert and oriented to person, place, and time.  Skin: No rash noted. No pallor.  Psychiatric: He has a normal mood and affect. His behavior is normal.  Nursing note and vitals reviewed.    ED Treatments / Results  Labs (all labs ordered are listed, but only abnormal results are displayed) Labs Reviewed  COMPREHENSIVE METABOLIC PANEL -  Abnormal; Notable for the following:       Result Value   Chloride 100 (*)    Glucose, Bld 146 (*)    BUN 21 (*)    Total Bilirubin 1.8 (*)    GFR calc non Af Amer 57 (*)    All other components within normal limits  CBC WITH DIFFERENTIAL/PLATELET - Abnormal; Notable for the following:    WBC 13.3 (*)    Neutro Abs 10.4 (*)    Monocytes Absolute 1.2 (*)    All other components within normal limits  URINALYSIS, ROUTINE W REFLEX MICROSCOPIC - Abnormal; Notable for the following:    Color, Urine AMBER (*)    APPearance CLOUDY (*)    Hgb urine dipstick MODERATE (*)    Ketones, ur 5 (*)    Protein, ur 100 (*)    Nitrite POSITIVE (*)    Leukocytes, UA LARGE (*)    Bacteria, UA MANY (*)    Squamous Epithelial / LPF 0-5 (*)    All other components within normal limits  CULTURE, BLOOD (ROUTINE X 2)  CULTURE, BLOOD (ROUTINE X 2)  URINE CULTURE  I-STAT CG4 LACTIC ACID, ED  I-STAT CG4 LACTIC ACID, ED    EKG  EKG Interpretation  Date/Time:  Tuesday October 07 2016 18:16:22 EDT Ventricular Rate:  77 PR Interval:    QRS Duration: 114 QT Interval:  396 QTC Calculation: 449 R Axis:   9 Text Interpretation:  Sinus rhythm Borderline intraventricular conduction delay No significant change since last tracing Confirmed by Hagan Maltz  MD, DANIEL [phone]) on 10/07/2016 6:35:43 PM       Radiology Dg Chest 2 View  Result Date: 10/07/2016 CLINICAL DATA:  Sepsis EXAM: CHEST  2 VIEW COMPARISON:  08/16/13 chest radiograph. FINDINGS: Low lung volumes. Stable cardiomediastinal silhouette with top-normal heart size and aortic atherosclerosis. No pneumothorax. No pleural effusion. No pulmonary edema. No consolidative airspace disease . IMPRESSION: Low lung volumes.  No active cardiopulmonary disease. Aortic atherosclerosis. Electronically Signed   By: Ilona Sorrel M.D.   On: 10/07/2016 19:18    Procedures Procedures (including critical care time)  Medications Ordered in ED Medications  sodium chloride 0.9 % bolus 1,000 mL (1,000 mLs Intravenous New Bag/Given 10/07/16 1835)     Initial Impression / Assessment and Plan / ED Course  I have reviewed the triage vital signs and the nursing notes.  Pertinent labs & imaging results that were available during my care of the patient were reviewed by me and considered in my medical decision making (see chart for details).     83 yo M with a chief complaint of a fever. Afebrile here in the ED. Will obtain a chest x-ray and UA to evaluate for bacterial source of illness. The patient is well-appearing and nontoxic.\  Patient was found to have a urinary tract infection. Patient also had a urinary tract infection about 2 or 3 weeks ago per him. Will start on ciprofloxacin. Have them follow with urology.  1:02 AM:  I have discussed the diagnosis/risks/treatment options with the patient and believe the pt to be eligible for discharge home to follow-up with Urology, PCP. We also discussed returning to the ED immediately if new or worsening sx occur. We discussed the sx which are most concerning (e.g., sudden worsening pain, fever, inability to tolerate by mouth) that necessitate immediate return. Medications administered to the patient during their visit and any new prescriptions provided to the  patient are listed below.  Medications given  during this visit Medications  sodium chloride 0.9 % bolus 1,000 mL (1,000 mLs Intravenous New Bag/Given 10/07/16 1835)     The patient appears reasonably screen and/or stabilized for discharge and I doubt any other medical condition or other [facility] requiring further screening, evaluation, or treatment in the ED at this time prior to discharge.    Final Clinical Impressions(s) / ED Diagnoses   Final diagnoses:  Pyelonephritis    New Prescriptions New Prescriptions   CIPROFLOXACIN (CIPRO) 500 MG TABLET    Take 1 tablet (500 mg total) by mouth 2 (two) times daily. One po bid x 7 days     Deno Etienne, DO 10/08/16 0102

## 2016-10-07 NOTE — ED Notes (Signed)
Bed: WA04 Expected date:  Expected time:  Means of arrival:  Comments: EMS 

## 2016-10-08 DIAGNOSIS — I5032 Chronic diastolic (congestive) heart failure: Secondary | ICD-10-CM | POA: Diagnosis not present

## 2016-10-08 DIAGNOSIS — R29898 Other symptoms and signs involving the musculoskeletal system: Secondary | ICD-10-CM | POA: Diagnosis not present

## 2016-10-08 DIAGNOSIS — Z87891 Personal history of nicotine dependence: Secondary | ICD-10-CM | POA: Diagnosis not present

## 2016-10-08 DIAGNOSIS — R2689 Other abnormalities of gait and mobility: Secondary | ICD-10-CM | POA: Diagnosis not present

## 2016-10-08 DIAGNOSIS — M25551 Pain in right hip: Secondary | ICD-10-CM | POA: Diagnosis not present

## 2016-10-08 DIAGNOSIS — R2681 Unsteadiness on feet: Secondary | ICD-10-CM | POA: Diagnosis not present

## 2016-10-08 DIAGNOSIS — N12 Tubulo-interstitial nephritis, not specified as acute or chronic: Secondary | ICD-10-CM | POA: Diagnosis not present

## 2016-10-08 DIAGNOSIS — I2510 Atherosclerotic heart disease of native coronary artery without angina pectoris: Secondary | ICD-10-CM | POA: Diagnosis not present

## 2016-10-08 DIAGNOSIS — M6281 Muscle weakness (generalized): Secondary | ICD-10-CM | POA: Diagnosis not present

## 2016-10-08 DIAGNOSIS — Z7389 Other problems related to life management difficulty: Secondary | ICD-10-CM | POA: Diagnosis not present

## 2016-10-08 DIAGNOSIS — M79604 Pain in right leg: Secondary | ICD-10-CM | POA: Diagnosis not present

## 2016-10-08 DIAGNOSIS — N3946 Mixed incontinence: Secondary | ICD-10-CM | POA: Diagnosis not present

## 2016-10-08 DIAGNOSIS — Z7982 Long term (current) use of aspirin: Secondary | ICD-10-CM | POA: Diagnosis not present

## 2016-10-08 DIAGNOSIS — Z79899 Other long term (current) drug therapy: Secondary | ICD-10-CM | POA: Diagnosis not present

## 2016-10-08 DIAGNOSIS — G9341 Metabolic encephalopathy: Secondary | ICD-10-CM | POA: Diagnosis not present

## 2016-10-08 DIAGNOSIS — I110 Hypertensive heart disease with heart failure: Secondary | ICD-10-CM | POA: Diagnosis not present

## 2016-10-08 DIAGNOSIS — A419 Sepsis, unspecified organism: Secondary | ICD-10-CM | POA: Diagnosis not present

## 2016-10-08 DIAGNOSIS — Z9181 History of falling: Secondary | ICD-10-CM | POA: Diagnosis not present

## 2016-10-08 DIAGNOSIS — R41841 Cognitive communication deficit: Secondary | ICD-10-CM | POA: Diagnosis not present

## 2016-10-08 DIAGNOSIS — Z96642 Presence of left artificial hip joint: Secondary | ICD-10-CM | POA: Diagnosis not present

## 2016-10-10 LAB — URINE CULTURE: Culture: >=100000 — AB

## 2016-10-11 ENCOUNTER — Telehealth: Payer: Self-pay

## 2016-10-11 NOTE — Telephone Encounter (Signed)
Post ED Visit - Positive Culture Follow-up  Culture report reviewed by antimicrobial stewardship pharmacist:  []  Elenor Quinones, Pharm.D. []  Heide Guile, Pharm.D., BCPS AQ-ID []  Parks Neptune, Pharm.D., BCPS []  Alycia Rossetti, Pharm.D., BCPS []  [place], Pharm.D., BCPS, AAHIVP []  Legrand Como, Pharm.D., BCPS, AAHIVP []  Salome Arnt, PharmD, BCPS [x]  Dimitri Ped, PharmD, BCPS []  Vincenza Hews, PharmD, BCPS  Positive urine culture Treated with Ciprofloxacin, organism sensitive to the same and no further patient follow-up is required at this time.  Genia Del 10/11/2016, 10:12 AM

## 2016-10-12 LAB — CULTURE, BLOOD (ROUTINE X 2)
Culture: NO GROWTH
Culture: NO GROWTH
Special Requests: ADEQUATE
Special Requests: ADEQUATE

## 2016-10-14 DIAGNOSIS — N390 Urinary tract infection, site not specified: Secondary | ICD-10-CM | POA: Diagnosis not present

## 2016-10-28 DIAGNOSIS — D1801 Hemangioma of skin and subcutaneous tissue: Secondary | ICD-10-CM | POA: Diagnosis not present

## 2016-10-28 DIAGNOSIS — L821 Other seborrheic keratosis: Secondary | ICD-10-CM | POA: Diagnosis not present

## 2016-10-30 ENCOUNTER — Telehealth: Payer: Self-pay | Admitting: Cardiovascular Disease

## 2016-10-30 DIAGNOSIS — E782 Mixed hyperlipidemia: Secondary | ICD-10-CM

## 2016-10-30 NOTE — Telephone Encounter (Signed)
Notified patient that lab appointment has been scheduled for 9/4 per his request. He thanked me for the call.

## 2016-10-30 NOTE — Telephone Encounter (Signed)
New Message    Can you schedule him lab work 11a 9/4  He is coming in 9/5 to see dr Acie Fredrickson

## 2016-11-14 DIAGNOSIS — N390 Urinary tract infection, site not specified: Secondary | ICD-10-CM | POA: Diagnosis not present

## 2016-11-14 DIAGNOSIS — N3020 Other chronic cystitis without hematuria: Secondary | ICD-10-CM | POA: Diagnosis not present

## 2016-11-14 DIAGNOSIS — N138 Other obstructive and reflux uropathy: Secondary | ICD-10-CM | POA: Diagnosis not present

## 2016-11-14 DIAGNOSIS — N401 Enlarged prostate with lower urinary tract symptoms: Secondary | ICD-10-CM | POA: Diagnosis not present

## 2016-11-21 DIAGNOSIS — N390 Urinary tract infection, site not specified: Secondary | ICD-10-CM | POA: Diagnosis not present

## 2016-11-21 DIAGNOSIS — N138 Other obstructive and reflux uropathy: Secondary | ICD-10-CM | POA: Diagnosis not present

## 2016-11-21 DIAGNOSIS — N401 Enlarged prostate with lower urinary tract symptoms: Secondary | ICD-10-CM | POA: Diagnosis not present

## 2016-11-21 DIAGNOSIS — N528 Other male erectile dysfunction: Secondary | ICD-10-CM | POA: Diagnosis not present

## 2016-11-21 DIAGNOSIS — N3020 Other chronic cystitis without hematuria: Secondary | ICD-10-CM | POA: Diagnosis not present

## 2017-02-03 DIAGNOSIS — G939 Disorder of brain, unspecified: Secondary | ICD-10-CM | POA: Diagnosis not present

## 2017-02-05 DIAGNOSIS — D329 Benign neoplasm of meninges, unspecified: Secondary | ICD-10-CM | POA: Diagnosis not present

## 2017-02-05 DIAGNOSIS — G939 Disorder of brain, unspecified: Secondary | ICD-10-CM | POA: Diagnosis not present

## 2017-02-09 DIAGNOSIS — G939 Disorder of brain, unspecified: Secondary | ICD-10-CM | POA: Diagnosis not present

## 2017-02-17 ENCOUNTER — Other Ambulatory Visit: Payer: Medicare HMO | Admitting: *Deleted

## 2017-02-17 DIAGNOSIS — E782 Mixed hyperlipidemia: Secondary | ICD-10-CM

## 2017-02-17 LAB — LIPID PANEL
Chol/HDL Ratio: 2.7 ratio (ref 0.0–5.0)
Cholesterol, Total: 194 mg/dL (ref 100–199)
HDL: 72 mg/dL (ref >39)
LDL Calculated: 105 mg/dL — ABNORMAL HIGH (ref 0–99)
Triglycerides: 87 mg/dL (ref 0–149)
VLDL Cholesterol Cal: 17 mg/dL (ref 5–40)

## 2017-02-17 LAB — COMPREHENSIVE METABOLIC PANEL
ALT: 20 IU/L (ref 0–44)
AST: 22 IU/L (ref 0–40)
Albumin/Globulin Ratio: 1.8 (ref 1.2–2.2)
Albumin: 4.4 g/dL (ref 3.5–4.7)
Alkaline Phosphatase: 59 IU/L (ref 39–117)
BUN/Creatinine Ratio: 15 (ref 10–24)
BUN: 13 mg/dL (ref 8–27)
Bilirubin Total: 1 mg/dL (ref 0.0–1.2)
CO2: 24 mmol/L (ref 20–29)
Calcium: 9.7 mg/dL (ref 8.6–10.2)
Chloride: 92 mmol/L — ABNORMAL LOW (ref 96–106)
Creatinine, Ser: 0.87 mg/dL (ref 0.76–1.27)
GFR calc Af Amer: 88 mL/min/{1.73_m2} (ref >59)
GFR calc non Af Amer: 77 mL/min/{1.73_m2} (ref >59)
Globulin, Total: 2.4 g/dL (ref 1.5–4.5)
Glucose: 85 mg/dL (ref 65–99)
Potassium: 4 mmol/L (ref 3.5–5.2)
Sodium: 134 mmol/L (ref 134–144)
Total Protein: 6.8 g/dL (ref 6.0–8.5)

## 2017-02-18 ENCOUNTER — Encounter (INDEPENDENT_AMBULATORY_CARE_PROVIDER_SITE_OTHER): Payer: Self-pay

## 2017-02-18 ENCOUNTER — Encounter: Payer: Self-pay | Admitting: Cardiovascular Disease

## 2017-02-18 ENCOUNTER — Ambulatory Visit (INDEPENDENT_AMBULATORY_CARE_PROVIDER_SITE_OTHER): Payer: Medicare HMO | Admitting: Cardiovascular Disease

## 2017-02-18 VITALS — BP 166/70 | HR 63 | Ht 67.5 in | Wt 172.2 lb

## 2017-02-18 DIAGNOSIS — I2583 Coronary atherosclerosis due to lipid rich plaque: Secondary | ICD-10-CM | POA: Diagnosis not present

## 2017-02-18 DIAGNOSIS — I5032 Chronic diastolic (congestive) heart failure: Secondary | ICD-10-CM | POA: Diagnosis not present

## 2017-02-18 DIAGNOSIS — I2510 Atherosclerotic heart disease of native coronary artery without angina pectoris: Secondary | ICD-10-CM

## 2017-02-18 NOTE — Progress Notes (Signed)
Cardiology Office Note   Date:  02/18/2017   ID:  [name] [name], DOB [birth year], MRN [id number]  PCP:  Deland Pretty, MD  Cardiologist:   Mertie Moores, MD   Chief Complaint  Patient presents with  . Follow-up    diastolic CHF, HTN   1. Moderate coronary artery disease by heart catheterization 20 years ago 2. Hyperlipidemia 3. Hypertension 4. Mild AI 5, Mild TR  History of Present Illness:  [name] [name] is a 83 y.o. gentleman with the above noted hx. He still has some ankle edema that he is concerned about. He has not had any chest pain. He is still very active and healthy.  Dec. 23, 2014:  [name] [name] has done well. No CP. No dyspnea or syncope. Still moderately active. He walks some. He does some stretching. He wants to start bowling again. He does have some symptoms when he first lies down or sits up. ? Vertigo vs. orthostasis hyotension.   03/28/2014:  [name] [name] is doing ok His wife died last year.  He stopped his amlodipine earlier this year - insurance would not pay for it so he stopped it. BP has been OK since that time Tries to watch his salt. Still eats canned veggies and other salty food.    Oct 20, 2014:  [name] [name] is a 83 y.o. male who presents for  Follow up for his CHf  May 24 ,2017:  [name] [name] is seen for his HTN and chronic diastolic CHF No CP or dyspnea.  Had some mid sternal CP several weeks ago - has resolved now  Sept. 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   It is growing slowly  No CP or dyspnea.  Has some occasional leg pain .  Has some neuropathy also   He has stopped his Atorvastatin - his recent lipid levels were slightly elevated.  He was concerned about possible side effect.     Past Medical History:  Diagnosis Date  . Allergic rhinitis   . Amnesia   . Bradycardia   . Colon polyps   .  Coronary artery disease    MODERATE  . Dizziness   . Edema of lower extremity   . Erectile dysfunction   . H. pylori infection    Hx of   . Hard of hearing   . Headache(784.0)   . History of colon polyps 2009   Dr. Orr/Colonoscopy -small cecal adenoma  . Hx of colonoscopy 2009   Dr Lajoyce Corners, hemorrhoids & polyps  . Hyperlipidemia   . Hypertension   . Internal hemorrhoids 2009   Dr. Lajoyce Corners Colonoscopy  . Mitral valve regurgitation   . Reflux esophagitis     Past Surgical History:  Procedure Laterality Date  . APPENDECTOMY    . CARDIAC CATHETERIZATION  1997   REVEALED MILD TO MODERATE IRREGULARITIES. HIS LEFT EVNTRICULAR SYSTOLIC FUNCTION REVEALED NORMAL EJECTION FRACTION WITH EF OF 70%  . CATARACT EXTRACTION, BILATERAL    . COLONOSCOPY  multiple  . HEMORROIDECTOMY    . HIP ARTHROPLASTY Left 08/17/2013   Procedure: ARTHROPLASTY BIPOLAR HIP;  Surgeon: Gearlean Alf, MD;  Location: [facility] OR[facility];  Service: Orthopedics;  Laterality: Left;  . INGUINAL HERNIA REPAIR Bilateral 2006  . lower back surgery  2006  . NASAL SEPTUM SURGERY    . TONSILLECTOMY    . UPPER GASTROINTESTINAL  ENDOSCOPY       Current Outpatient Prescriptions  Medication Sig Dispense Refill  . amLODipine (NORVASC) 5 MG tablet Take 5 mg by mouth daily.    . ASPIR-LOW 81 MG EC tablet Take 81 mg by mouth daily.  6  . Cholecalciferol (VITAMIN D3) 1000 UNITS CAPS Take 1 capsule by mouth daily.     . furosemide (LASIX) 40 MG tablet Take 40 mg by mouth 3 (three) times a week.    Marland Kitchen lisinopril-hydrochlorothiazide (PRINZIDE,ZESTORETIC) 20-25 MG tablet Take 1 tablet by mouth daily.    . Multiple Vitamin (MULTIVITAMIN) tablet Take 1 tablet by mouth daily.      Marland Kitchen NIACIN PO Take 500 mg by mouth daily. Mon, tues, thurs sat    . vitamin B-12 (CYANOCOBALAMIN) 1000 MCG tablet Take 1,000 mcg by mouth daily.    Marland Kitchen zinc gluconate 50 MG tablet Take 100 mg by mouth 3 (three) times a week. Take on Mon wed and fridays     No current  facility-administered medications for this visit.     Allergies:   Patient has no known allergies.    Social History:  The patient  reports that he quit smoking about 41 years ago. He has never used smokeless tobacco. He reports that he drinks about 1.2 oz of alcohol per week . He reports that he does not use drugs.   Family History:  The patient's family history includes Breast cancer in his sister; Colon cancer (age of onset: 64) in his mother.    ROS:  Please see the history of present illness.    Review of Systems: Constitutional:  denies fever, chills, diaphoresis, appetite change and fatigue.  HEENT: denies photophobia, eye pain, redness, hearing loss, ear pain, congestion, sore throat, rhinorrhea, sneezing, neck pain, neck stiffness and tinnitus.  Respiratory: denies SOB, DOE, cough, chest tightness, and wheezing.  Cardiovascular: denies chest pain, palpitations and leg swelling.  Gastrointestinal: denies nausea, vomiting, abdominal pain, diarrhea, constipation, blood in stool.  Genitourinary: denies dysuria, urgency, frequency, hematuria, flank pain and difficulty urinating.  Musculoskeletal: denies  myalgias, back pain, joint swelling, arthralgias and gait problem.   Skin: denies pallor, rash and wound.  Neurological: denies dizziness, seizures, syncope, weakness, light-headedness, numbness and headaches.   Hematological: denies adenopathy, easy bruising, personal or family bleeding history.  Psychiatric/ Behavioral: denies suicidal ideation, mood changes, confusion, nervousness, sleep disturbance and agitation.       All other systems are reviewed and negative.    PHYSICAL EXAM: VS:  BP (!) 166/70   Pulse 63   Ht 5' 7.5" (1.715 m)   Wt 172 lb 3.2 oz (78.1 kg)   BMI 26.57 kg/m  , BMI Body mass index is 26.57 kg/m. GEN: Well nourished, well developed, in no acute distress  HEENT: normal  Neck: no JVD, carotid bruits, or masses Cardiac: RR; soft systolic murmur, no   rubs, or gallops,  Respiratory:  clear to auscultation bilaterally, normal work of breathing GI: soft, nontender, nondistended, + BS MS: no deformity or atrophy  Trace leg edema with chronic stasis changes.   Skin: warm and dry, no rash Neuro:  Strength and sensation are intact Psych: normal   EKG:  EKG is ordered today.  Recent Labs: 10/07/2016: Hemoglobin 13.9; Platelets 194 02/17/2017: ALT 20; BUN 13; Creatinine, Ser 0.87; Potassium 4.0; Sodium 134    Lipid Panel    Component Value Date/Time   CHOL 194 02/17/2017 0820   TRIG 87 02/17/2017 0820   HDL  72 02/17/2017 0820   CHOLHDL 2.7 02/17/2017 0820   CHOLHDL 2.2 10/30/2015 0737   VLDL 11 10/30/2015 0737   LDLCALC 105 (H) 02/17/2017 0820      Wt Readings from Last 3 Encounters:  02/18/17 172 lb 3.2 oz (78.1 kg)  07/11/16 172 lb 3.2 oz (78.1 kg)  11/07/15 175 lb 6.4 oz (79.6 kg)      Other studies Reviewed: Additional studies/ records that were reviewed today include: . Review of the above records demonstrates:    ASSESSMENT AND PLAN:  1. Moderate coronary artery disease by heart catheterization 20 years ago 2. Hyperlipidemia- LDL is 105 HDL is 72.    3. Hypertension - BP , Needs to watch his salt intake   4. Balance issues:  Has loss of balance frequently .  Was seen in the ER.   MRI shows a benign tumur.  hes not sure if he will have surgery on this tumor or not.   5. Mild AI 6, Mild TR 7. Chronic diastolic congestive heart failure-he remains very stable. Continue current medications.   Current medicines are reviewed at length with the patient today.  The patient does not have concerns regarding medicines.  The following changes have been made:  no change  Labs/ tests ordered today include:  No orders of the defined types were placed in this encounter.   Continue same meds.  Disposition:   FU with me in 1 year     Mertie Moores, MD  02/18/2017 11:35 AM    [address], [place], [place]  [address] Phone: [phone]; Fax: [phone]

## 2017-02-18 NOTE — Patient Instructions (Signed)
Medication Instructions:  Your physician recommends that you continue on your current medications as directed. Please refer to the Current Medication list given to you today.   Labwork: None Ordered   Testing/Procedures: None Ordered   Follow-Up: Your physician wants you to follow-up in: 1 year with Dr. [name].  You will receive a reminder letter in the mail two months in advance. If you don't receive a letter, please call our office to schedule the follow-up appointment.   If you need a refill on your cardiac medications before your next appointment, please call your pharmacy.   Thank you for choosing [facility]! Katalyna Socarras, RN [phone]    

## 2017-03-26 DIAGNOSIS — Z0000 Encounter for general adult medical examination without abnormal findings: Secondary | ICD-10-CM | POA: Diagnosis not present

## 2017-03-26 DIAGNOSIS — I10 Essential (primary) hypertension: Secondary | ICD-10-CM | POA: Diagnosis not present

## 2017-03-26 DIAGNOSIS — E785 Hyperlipidemia, unspecified: Secondary | ICD-10-CM | POA: Diagnosis not present

## 2017-03-31 DIAGNOSIS — N390 Urinary tract infection, site not specified: Secondary | ICD-10-CM | POA: Diagnosis not present

## 2017-03-31 DIAGNOSIS — I340 Nonrheumatic mitral (valve) insufficiency: Secondary | ICD-10-CM | POA: Diagnosis not present

## 2017-03-31 DIAGNOSIS — E785 Hyperlipidemia, unspecified: Secondary | ICD-10-CM | POA: Diagnosis not present

## 2017-03-31 DIAGNOSIS — I779 Disorder of arteries and arterioles, unspecified: Secondary | ICD-10-CM | POA: Diagnosis not present

## 2017-03-31 DIAGNOSIS — R600 Localized edema: Secondary | ICD-10-CM | POA: Diagnosis not present

## 2017-03-31 DIAGNOSIS — I5032 Chronic diastolic (congestive) heart failure: Secondary | ICD-10-CM | POA: Diagnosis not present

## 2017-03-31 DIAGNOSIS — R001 Bradycardia, unspecified: Secondary | ICD-10-CM | POA: Diagnosis not present

## 2017-03-31 DIAGNOSIS — I10 Essential (primary) hypertension: Secondary | ICD-10-CM | POA: Diagnosis not present

## 2017-03-31 DIAGNOSIS — I2510 Atherosclerotic heart disease of native coronary artery without angina pectoris: Secondary | ICD-10-CM | POA: Diagnosis not present

## 2017-03-31 DIAGNOSIS — K620 Anal polyp: Secondary | ICD-10-CM | POA: Diagnosis not present

## 2017-04-07 IMAGING — CT CT HEAD W/O CM
1 series · 15 of 30 positions shown, 19 images · non-contrast
Comparison: Brain MRI 02/28/2011

CLINICAL DATA: Confusion, dizziness, fall

EXAM:
CT HEAD WITHOUT CONTRAST
TECHNIQUE: Contiguous axial images were obtained from the base of the skull
through the vertex without intravenous contrast.

[Series 2: head 5.0 h30s · axial · 0.45mm/px · z∈[-103,+47]mm · 15 of 34 slices shown, 19 images]
[im 2/34  brain]
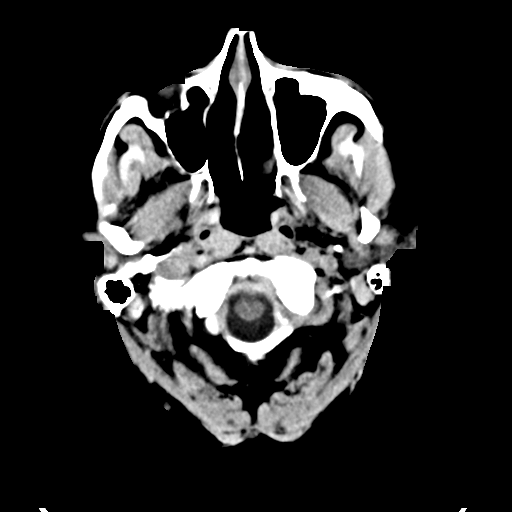
[im 2/34  bone]
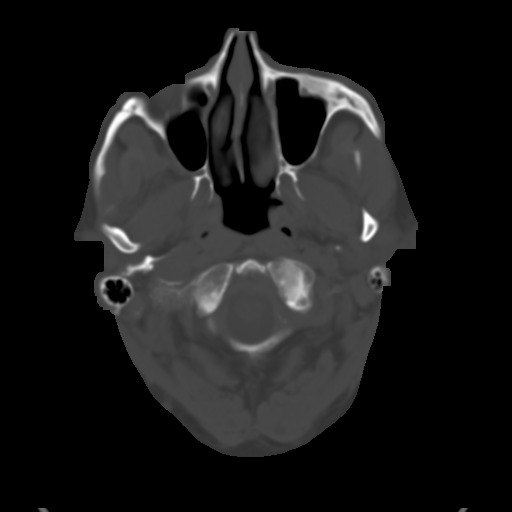
[im 4/34  brain]
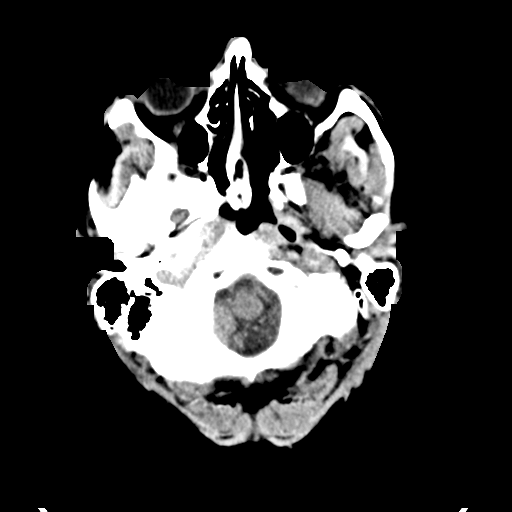
[im 6/34  brain]
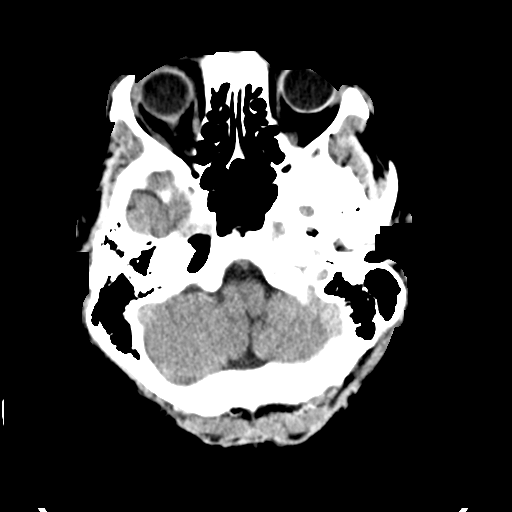
[im 8/34  brain]
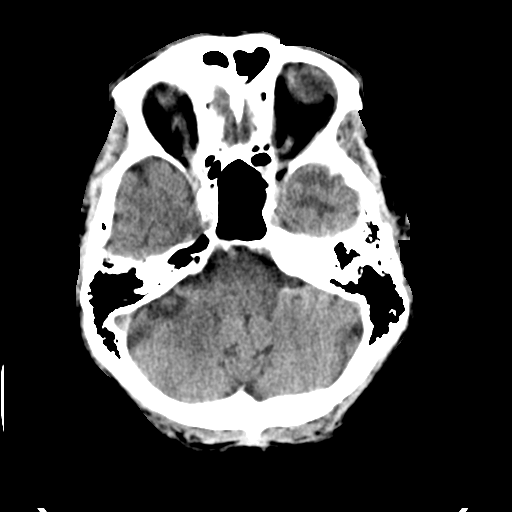
[im 11/34  brain]
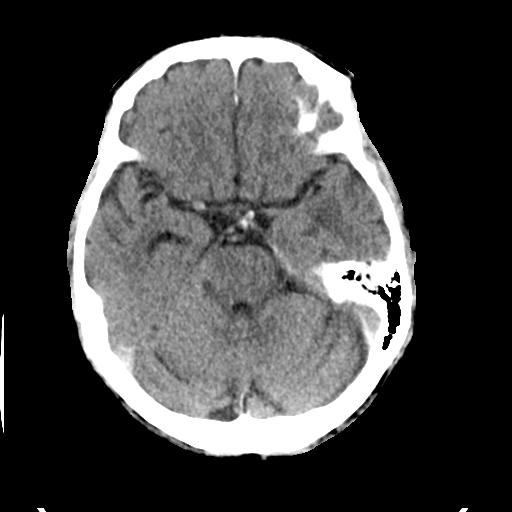
[im 11/34  bone]
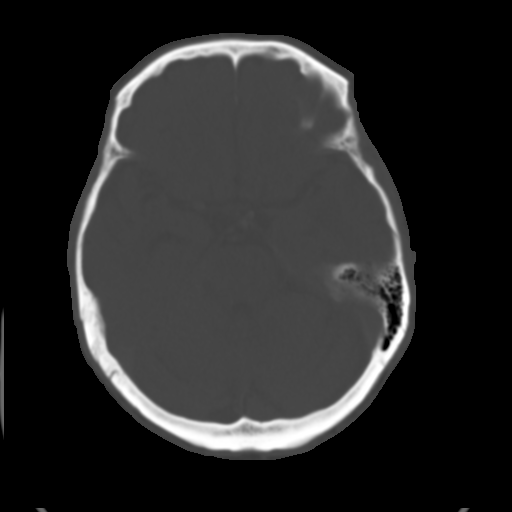
[im 13/34  brain]
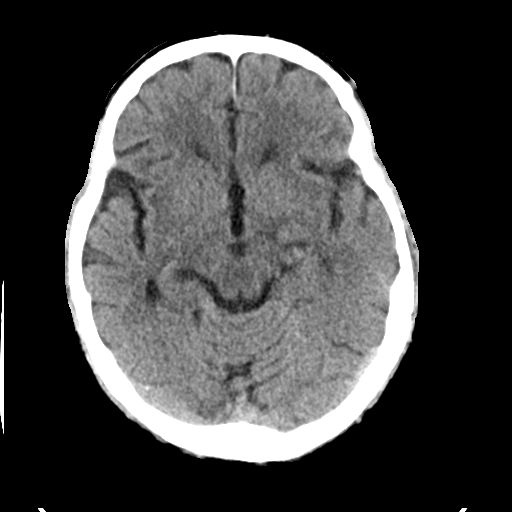
[im 15/34  brain]
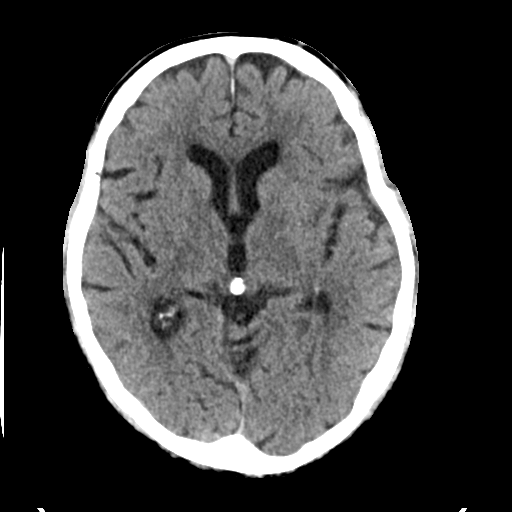
[im 18/34  brain]
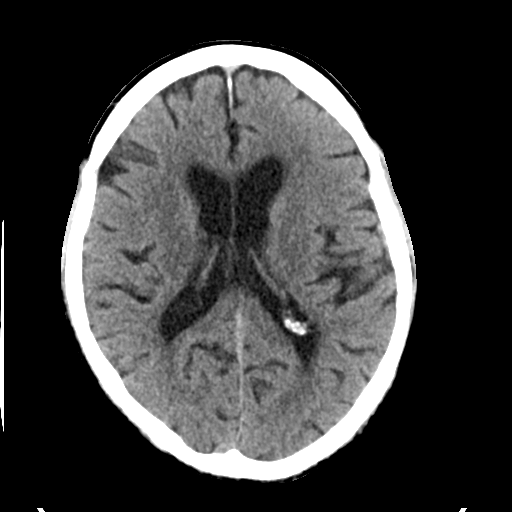
[im 19/34  brain]
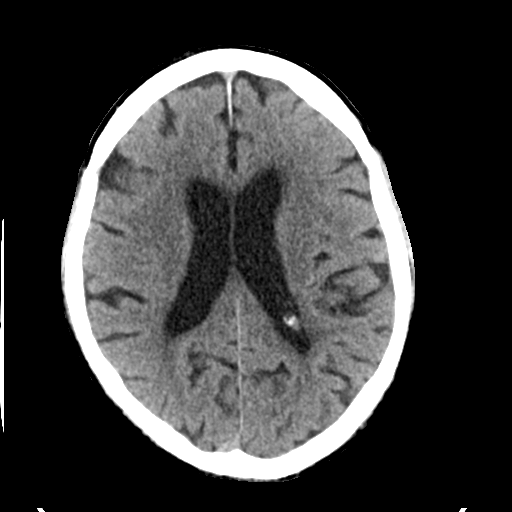
[im 19/34  bone]
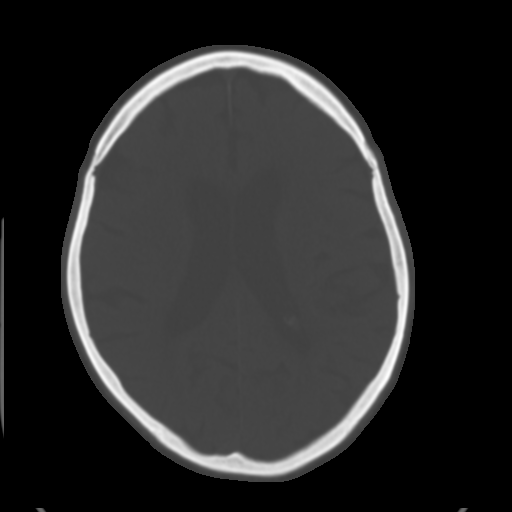
[im 21/34  brain]
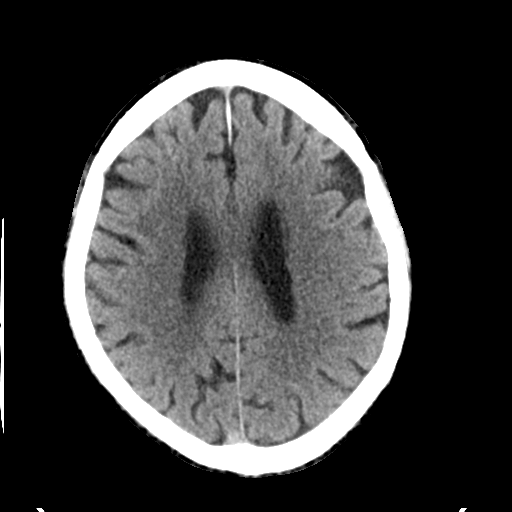
[im 23/34  brain]
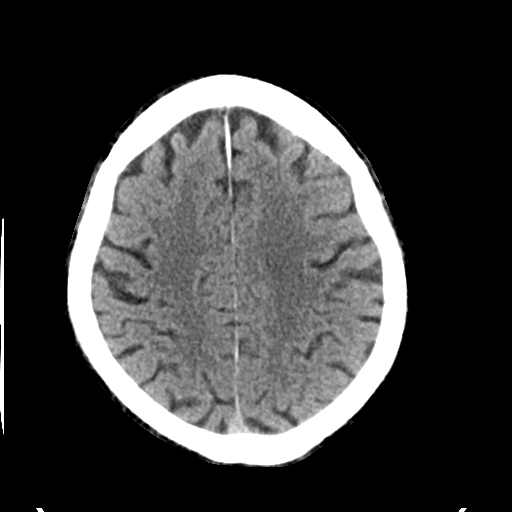
[im 26/34  brain]
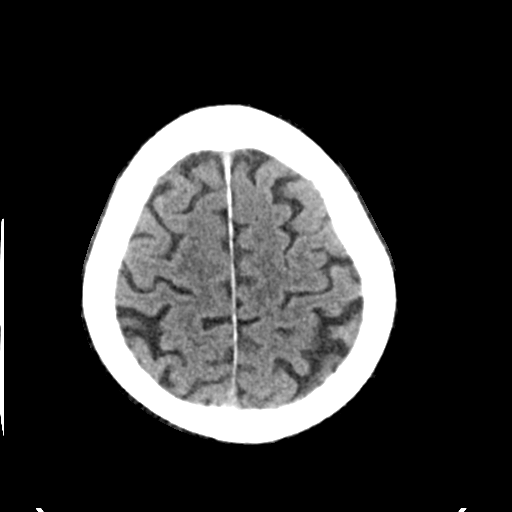
[im 28/34  brain]
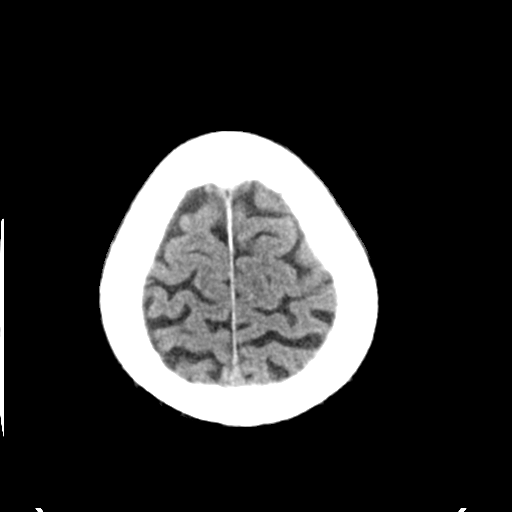
[im 28/34  bone]
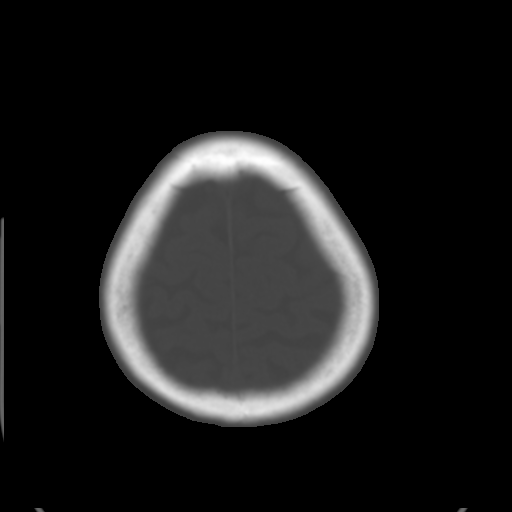
[im 30/34  brain]
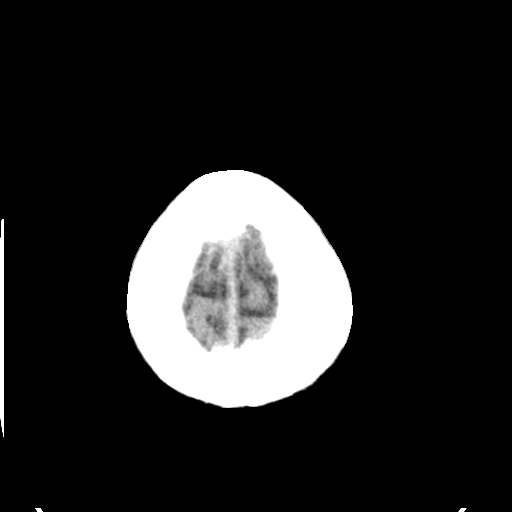
[im 32/34  brain]
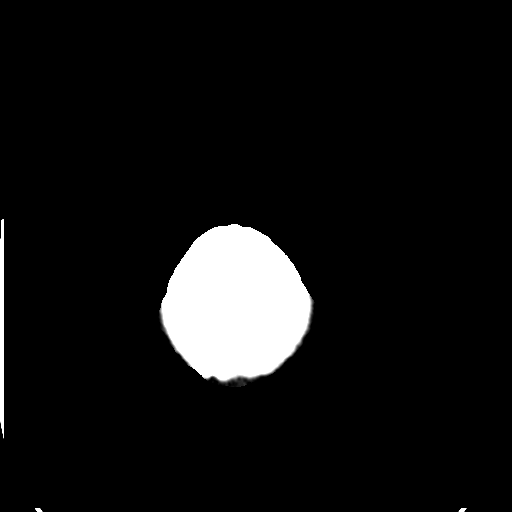

[15 of 30 positions shown; findings below may reference images not displayed]

FINDINGS: No intracranial hemorrhage, or midline shift. There is focal area of
decreased attenuation in left temporal lobe. Measures about 2.2 cm.
Vasogenic edema or white matter demyelination cannot be excluded.
Less likely cortical infarct. Further correlation with MRI is
recommended. Mild cerebral atrophy. Mild periventricular white
matter decreased attenuation probable due to chronic small vessel
ischemic changes.

Vascular: Atherosclerotic calcifications of carotid siphon are
noted.

Skull: No skull fracture is noted.

Sinuses/Orbits: Paranasal sinuses and mastoid air cells are
unremarkable.

Other: None
IMPRESSION: No intracranial hemorrhage, or midline shift. There is focal area of
decreased attenuation in left temporal lobe. Measures about 2.2 cm.
Vasogenic edema or white matter demyelination cannot be excluded.
Less likely cortical infarct. Further correlation with MRI is
recommended. Mild cerebral atrophy. Mild periventricular white
matter decreased attenuation probable due to chronic small vessel
ischemic changes.

## 2017-05-12 DIAGNOSIS — E785 Hyperlipidemia, unspecified: Secondary | ICD-10-CM | POA: Diagnosis not present

## 2017-05-12 DIAGNOSIS — I2510 Atherosclerotic heart disease of native coronary artery without angina pectoris: Secondary | ICD-10-CM | POA: Diagnosis not present

## 2017-05-14 DIAGNOSIS — I2510 Atherosclerotic heart disease of native coronary artery without angina pectoris: Secondary | ICD-10-CM | POA: Diagnosis not present

## 2017-05-14 DIAGNOSIS — R600 Localized edema: Secondary | ICD-10-CM | POA: Diagnosis not present

## 2017-05-14 DIAGNOSIS — E785 Hyperlipidemia, unspecified: Secondary | ICD-10-CM | POA: Diagnosis not present

## 2017-06-26 DIAGNOSIS — Z961 Presence of intraocular lens: Secondary | ICD-10-CM | POA: Diagnosis not present

## 2017-06-26 DIAGNOSIS — H43811 Vitreous degeneration, right eye: Secondary | ICD-10-CM | POA: Diagnosis not present

## 2017-06-26 DIAGNOSIS — H26492 Other secondary cataract, left eye: Secondary | ICD-10-CM | POA: Diagnosis not present

## 2017-06-26 DIAGNOSIS — H35363 Drusen (degenerative) of macula, bilateral: Secondary | ICD-10-CM | POA: Diagnosis not present

## 2017-07-17 DIAGNOSIS — G939 Disorder of brain, unspecified: Secondary | ICD-10-CM | POA: Diagnosis not present

## 2017-07-21 DIAGNOSIS — D329 Benign neoplasm of meninges, unspecified: Secondary | ICD-10-CM | POA: Diagnosis not present

## 2017-07-21 DIAGNOSIS — G939 Disorder of brain, unspecified: Secondary | ICD-10-CM | POA: Diagnosis not present

## 2017-08-04 DIAGNOSIS — G939 Disorder of brain, unspecified: Secondary | ICD-10-CM | POA: Diagnosis not present

## 2017-08-05 DIAGNOSIS — L304 Erythema intertrigo: Secondary | ICD-10-CM | POA: Diagnosis not present

## 2017-08-05 DIAGNOSIS — L0390 Cellulitis, unspecified: Secondary | ICD-10-CM | POA: Diagnosis not present

## 2017-08-12 DIAGNOSIS — L304 Erythema intertrigo: Secondary | ICD-10-CM | POA: Diagnosis not present

## 2017-08-12 DIAGNOSIS — L0390 Cellulitis, unspecified: Secondary | ICD-10-CM | POA: Diagnosis not present

## 2017-08-20 DIAGNOSIS — L0390 Cellulitis, unspecified: Secondary | ICD-10-CM | POA: Diagnosis not present

## 2017-08-20 DIAGNOSIS — I10 Essential (primary) hypertension: Secondary | ICD-10-CM | POA: Diagnosis not present

## 2017-08-20 DIAGNOSIS — L304 Erythema intertrigo: Secondary | ICD-10-CM | POA: Diagnosis not present

## 2017-09-30 DIAGNOSIS — E785 Hyperlipidemia, unspecified: Secondary | ICD-10-CM | POA: Diagnosis not present

## 2017-10-01 DIAGNOSIS — G629 Polyneuropathy, unspecified: Secondary | ICD-10-CM | POA: Diagnosis not present

## 2017-10-01 DIAGNOSIS — E785 Hyperlipidemia, unspecified: Secondary | ICD-10-CM | POA: Diagnosis not present

## 2017-10-01 DIAGNOSIS — D329 Benign neoplasm of meninges, unspecified: Secondary | ICD-10-CM | POA: Diagnosis not present

## 2017-10-01 DIAGNOSIS — K625 Hemorrhage of anus and rectum: Secondary | ICD-10-CM | POA: Diagnosis not present

## 2017-10-01 DIAGNOSIS — I10 Essential (primary) hypertension: Secondary | ICD-10-CM | POA: Diagnosis not present

## 2017-10-09 DIAGNOSIS — C712 Malignant neoplasm of temporal lobe: Secondary | ICD-10-CM | POA: Diagnosis not present

## 2017-10-09 DIAGNOSIS — Z87891 Personal history of nicotine dependence: Secondary | ICD-10-CM | POA: Diagnosis not present

## 2017-10-09 DIAGNOSIS — D320 Benign neoplasm of cerebral meninges: Secondary | ICD-10-CM | POA: Diagnosis not present

## 2017-10-20 ENCOUNTER — Ambulatory Visit: Payer: Medicare HMO | Admitting: Physician Assistant

## 2017-10-28 ENCOUNTER — Encounter (INDEPENDENT_AMBULATORY_CARE_PROVIDER_SITE_OTHER): Payer: Self-pay

## 2017-10-28 ENCOUNTER — Encounter: Payer: Self-pay | Admitting: Internal Medicine

## 2017-10-28 ENCOUNTER — Ambulatory Visit: Payer: Medicare HMO | Admitting: Internal Medicine

## 2017-10-28 VITALS — BP 154/60 | HR 60 | Ht 67.0 in | Wt 168.0 lb

## 2017-10-28 DIAGNOSIS — K648 Other hemorrhoids: Secondary | ICD-10-CM | POA: Insufficient documentation

## 2017-10-28 NOTE — Progress Notes (Signed)
[name] [name] 84 y.o. 1924-11-10 [id number]  Assessment & Plan:   Encounter Diagnosis  Name Primary?  . Bleeding internal hemorrhoids Yes    RP banded - looked like culprit  F/u prn  I appreciate the opportunity to care for him. QI:[facility], Thayer Jew, MD   Subjective:   Chief Complaint:  HPI Having rectal bleeding on paper and some in commode past week or so. No sig constipation or diarrhea. CBC is NL  Colonoscopy 2012 2 diminutive adenomas    No Known Allergies  Current Outpatient Medications:  .  amLODipine (NORVASC) 5 MG tablet, Take 5 mg by mouth daily., Disp: , Rfl:  .  Cholecalciferol (VITAMIN D3) 1000 UNITS CAPS, Take 1 capsule by mouth daily. , Disp: , Rfl:  .  furosemide (LASIX) 40 MG tablet, Take 40 mg by mouth 2 (two) times daily as needed. , Disp: , Rfl:  .  lisinopril-hydrochlorothiazide (PRINZIDE,ZESTORETIC) 20-25 MG tablet, Take 1 tablet by mouth daily., Disp: , Rfl:  .  Multiple Vitamin (MULTIVITAMIN) tablet, Take 1 tablet by mouth daily.  , Disp: , Rfl:  .  NIACIN PO, Take 500 mg by mouth daily. Mon, tues, thurs sat, Disp: , Rfl:  .  vitamin B-12 (CYANOCOBALAMIN) 1000 MCG tablet, Take 1,000 mcg by mouth daily., Disp: , Rfl:  .  zinc gluconate 50 MG tablet, Take 100 mg by mouth 3 (three) times a week. Take on Mon wed and fridays, Disp: , Rfl:   Past Medical History:  Diagnosis Date  . Allergic rhinitis   . Amnesia   . Bradycardia   . Colon polyps   . Coronary artery disease    MODERATE  . Dizziness   . Edema of lower extremity   . Erectile dysfunction   . H. pylori infection    Hx of   . Hard of hearing   . Headache(784.0)   . History of colon polyps 2009   Dr. Orr/Colonoscopy -small cecal adenoma  . Hx of colonoscopy 2009   Dr Lajoyce Corners, hemorrhoids & polyps  . Hyperlipidemia   . Hypertension   . Internal hemorrhoids 2009   Dr. Lajoyce Corners Colonoscopy  . Mitral valve regurgitation   . Reflux esophagitis    Past Surgical History:    Procedure Laterality Date  . APPENDECTOMY    . CARDIAC CATHETERIZATION  1997   REVEALED MILD TO MODERATE IRREGULARITIES. HIS LEFT EVNTRICULAR SYSTOLIC FUNCTION REVEALED NORMAL EJECTION FRACTION WITH EF OF 70%  . CATARACT EXTRACTION, BILATERAL    . COLONOSCOPY  multiple  . HEMORROIDECTOMY    . HIP ARTHROPLASTY Left 08/17/2013   Procedure: ARTHROPLASTY BIPOLAR HIP;  Surgeon: Gearlean Alf, MD;  Location: [facility];  Service: Orthopedics;  Laterality: Left;  . INGUINAL HERNIA REPAIR Bilateral 2006  . lower back surgery  2006  . NASAL SEPTUM SURGERY    . TONSILLECTOMY    . UPPER GASTROINTESTINAL ENDOSCOPY     Social History   Social History Narrative   Retired and widowed   family history includes Breast cancer in his sister; Colon cancer (age of onset: 40) in his mother.   Review of Systems As above  Objective:   Physical Exam BP (!) 154/60   Pulse 60   Ht 5\' 7"  (1.702 m)   Wt 168 lb (76.2 kg)   BMI 26.31 kg/m   Rectal   No mass non tender  Anoscopy Gr 2 inflamed RP internal hemorrhoid  PROCEDURE NOTE: The patient presents with symptomatic grade 2 hemorrhoids,  requesting rubber band ligation of his/her hemorrhoidal disease.  All risks, benefits and alternative forms of therapy were described and informed consent was obtained.   The anorectum was pre-medicated with 0.125% NTG and 5% lidocaine  The decision was made to band the RP internal hemorrhoid, and the [name] was used to perform band ligation without complication.  Digital anorectal examination was then performed to assure proper positioning of the band, and to adjust the banded tissue as required.  The patient was discharged home without pain or other issues.  Dietary and behavioral recommendations were given and along with follow-up instructions.      The patient will return as needed for  follow-up and possible additional banding as required. No complications were encountered and the patient  tolerated the procedure well.

## 2017-10-28 NOTE — Patient Instructions (Signed)
HEMORRHOID BANDING PROCEDURE    FOLLOW-UP CARE   1. The procedure you have had should have been relatively painless since the banding of the area involved does not have nerve endings and there is no pain sensation.  The rubber band cuts off the blood supply to the hemorrhoid and the band may fall off as soon as 48 hours after the banding (the band may occasionally be seen in the toilet bowl following a bowel movement). You may notice a temporary feeling of fullness in the rectum which should respond adequately to plain Tylenol or Motrin.  2. Following the banding, avoid strenuous exercise that evening and resume full activity the next day.  A sitz bath (soaking in a warm tub) or bidet is soothing, and can be useful for cleansing the area after bowel movements.     3. To avoid constipation, take two tablespoons of natural wheat bran, natural oat bran, flax, Benefiber or any over the counter fiber supplement and increase your water intake to 7-8 glasses daily.    4. Unless you have been prescribed anorectal medication, do not put anything inside your rectum for two weeks: No suppositories, enemas, fingers, etc.  5. Occasionally, you may have more bleeding than usual after the banding procedure.  This is often from the untreated hemorrhoids rather than the treated one.  Don't be concerned if there is a tablespoon or so of blood.  If there is more blood than this, lie flat with your bottom higher than your head and apply an ice pack to the area. If the bleeding does not stop within a half an hour or if you feel faint, call our office at [phone] or go to the emergency room.  6. Problems are not common; however, if there is a substantial amount of bleeding, severe pain, chills, fever or difficulty passing urine (very rare) or other problems, you should call us at [phone] or report to the nearest emergency room.  7. Do not stay seated continuously for more than 2-3 hours for a day or two  after the procedure.  Tighten your buttock muscles 10-15 times every two hours and take 10-15 deep breaths every 1-2 hours.  Do not spend more than a few minutes on the toilet if you cannot empty your bowel; instead re-visit the toilet at a later time.    Come back and see [place] as needed.   I appreciate the opportunity to care for you. Silvano Rusk, MD, [facility]

## 2017-11-03 ENCOUNTER — Ambulatory Visit: Payer: Medicare HMO | Admitting: Neurology

## 2017-11-03 ENCOUNTER — Encounter: Payer: Self-pay | Admitting: Neurology

## 2017-11-03 ENCOUNTER — Telehealth: Payer: Self-pay | Admitting: Neurology

## 2017-11-03 VITALS — BP 148/68 | HR 58 | Ht 67.0 in | Wt 169.0 lb

## 2017-11-03 DIAGNOSIS — M79604 Pain in right leg: Secondary | ICD-10-CM | POA: Diagnosis not present

## 2017-11-03 DIAGNOSIS — R200 Anesthesia of skin: Secondary | ICD-10-CM | POA: Diagnosis not present

## 2017-11-03 DIAGNOSIS — R29898 Other symptoms and signs involving the musculoskeletal system: Secondary | ICD-10-CM | POA: Diagnosis not present

## 2017-11-03 DIAGNOSIS — W19XXXA Unspecified fall, initial encounter: Secondary | ICD-10-CM

## 2017-11-03 DIAGNOSIS — M5416 Radiculopathy, lumbar region: Secondary | ICD-10-CM

## 2017-11-03 NOTE — Progress Notes (Signed)
[facility]    Provider:  Dr Jaynee Eagles Referring Provider: Deland Pretty, MD Primary Care Physician:  Deland Pretty, MD  CC:  tingling  HPI:  [name] [name] is a 84 y.o. male here as a referral from Dr. Shelia Media for dysesthesias of the right leg.  He has a past medical history of hypertension, Meningioma, coronary artery disease, hyperlipidemia, gait abnormality previously evaluated by neurology, chronic lower extremity edema, episodic dizziness, bradycardia, hard of hearing, leg edema. He has had symptoms for years, tingling in his right big, feels like electrical shocks but what has worsened and painful and spreading up the groin, in the groin it hurts, he has to put a pillow between his legs when he sleeps, extra strength tylenol helps, he feels weakness in his leg. The groin pain is NOT associated with the tingling in the toes but feels pain in the anterior thigh. It goes and comes, continuous every day and worsening. He has fallen. He fell this past Sunday on his right hip. Denies back pain. He had surgery in the past which affected his right leg, symptoms may be secondary to his previous back surgery or something new.  Reviewed notes, labs and imaging from outside physicians, which showed:  Reviewed referring physician's notes.  Patient complains of dysesthesias to the right leg.  Trace pitting edema noted in the bilateral lower extremities.  He was referred for neuropathy of the right leg.  He also has a meningioma and he sees neurosurgery Dr. Ronnald Ramp for a second opinion.   CBC unremarkable.  Personally reviewed MRI of the brain 2017 which showed a 12 mm meningioma along the medial aspect of the left middle cranial fossa with significant edema.  Review of Systems: Patient complains of symptoms per HPI as well as the following symptoms: imbalance,memory loss. Pertinent negatives and positives per HPI. All others negative.   Social History   Socioeconomic History  .  Marital status: Widowed    Spouse name: Not on file  . Number of children: 3  . Years of education: Not on file  . Highest education level: Not on file  Occupational History  . Not on file  Social Needs  . Financial resource strain: Not on file  . Food insecurity:    Worry: Not on file    Inability: Not on file  . Transportation needs:    Medical: Not on file    Non-medical: Not on file  Tobacco Use  . Smoking status: Former Smoker    Last attempt to quit: 10/25/1975    Years since quitting: 42.0  . Smokeless tobacco: Never Used  Substance and Sexual Activity  . Alcohol use: Yes    Comment: occ 6-7 times a year  . Drug use: No  . Sexual activity: Not on file  Lifestyle  . Physical activity:    Days per week: Not on file    Minutes per session: Not on file  . Stress: Not on file  Relationships  . Social connections:    Talks on phone: Not on file    Gets together: Not on file    Attends religious service: Not on file    Active member of club or organization: Not on file    Attends meetings of clubs or organizations: Not on file    Relationship status: Not on file  . Intimate partner violence:    Fear of current or ex partner: Not on file    Emotionally abused: Not on file  Physically abused: Not on file    Forced sexual activity: Not on file  Other Topics Concern  . Not on file  Social History Narrative   Retired and widowed   Lives at [facility]   Right handed    Family History  Problem Relation Age of Onset  . Colon cancer Mother 9       second cancer in 110's deceased after emergency surgery  . Breast cancer Sister     Past Medical History:  Diagnosis Date  . Allergic rhinitis   . Amnesia   . BPH (benign prostatic hyperplasia)   . Bradycardia   . Colon polyps   . Coronary artery disease    MODERATE  . Dizziness   . Edema of lower extremity   . Erectile dysfunction   . H. pylori infection    Hx of   . Hard of hearing   .  Headache(784.0)   . History of colon polyps 2009   Dr. Orr/Colonoscopy -small cecal adenoma  . Hx of colonoscopy 2009   Dr Lajoyce Corners, hemorrhoids & polyps  . Hyperlipidemia   . Hypertension   . Internal hemorrhoids 2009   Dr. Lajoyce Corners Colonoscopy  . Mitral valve regurgitation   . Reflux esophagitis     Past Surgical History:  Procedure Laterality Date  . APPENDECTOMY    . CARDIAC CATHETERIZATION  1997   REVEALED MILD TO MODERATE IRREGULARITIES. HIS LEFT EVNTRICULAR SYSTOLIC FUNCTION REVEALED NORMAL EJECTION FRACTION WITH EF OF 70%  . CATARACT EXTRACTION, BILATERAL    . COLONOSCOPY  multiple  . fatty tumor removal     benign  . HEMORROIDECTOMY    . HIP ARTHROPLASTY Left 08/17/2013   Procedure: ARTHROPLASTY BIPOLAR HIP;  Surgeon: Gearlean Alf, MD;  Location: [facility];  Service: Orthopedics;  Laterality: Left;  . INGUINAL HERNIA REPAIR Bilateral 2006  . lower back surgery  2006  . NASAL SEPTUM SURGERY    . TONSILLECTOMY    . UPPER GASTROINTESTINAL ENDOSCOPY      Current Outpatient Medications  Medication Sig Dispense Refill  . acetaminophen (TYLENOL) 500 MG tablet Take 500 mg by mouth as needed.    Marland Kitchen amLODipine (NORVASC) 5 MG tablet Take 5 mg by mouth daily.    Marland Kitchen lisinopril-hydrochlorothiazide (PRINZIDE,ZESTORETIC) 20-25 MG tablet Take 1 tablet by mouth daily.    . Multiple Vitamin (MULTIVITAMIN) tablet Take 1 tablet by mouth daily.      Marland Kitchen NIACIN PO Take 500 mg by mouth daily. Mon, tues, thurs sat    . OVER THE COUNTER MEDICATION Take 1 capsule by mouth daily. Vitamin D & Fish Oil in one large capsule    . vitamin B-12 (CYANOCOBALAMIN) 1000 MCG tablet Take 1,000 mcg by mouth daily.    Marland Kitchen zinc gluconate 50 MG tablet Take 100 mg by mouth 3 (three) times a week. Take on Mon wed and fridays    . furosemide (LASIX) 40 MG tablet Take 40 mg by mouth 2 (two) times daily as needed.     . rosuvastatin (CRESTOR) 5 MG tablet Take 5 mg by mouth. Takes rarely     No current facility-administered  medications for this visit.     Allergies as of 11/03/2017  . (No Known Allergies)    Vitals: BP (!) 148/68 (BP Location: Right Arm, Patient Position: Sitting)   Pulse (!) 58   Ht 5\' 7"  (1.702 m)   Wt 169 lb (76.7 kg)   BMI 26.47 kg/m  Last Weight:  Wt Readings from Last 1 Encounters:  11/03/17 169 lb (76.7 kg)   Last Height:   Ht Readings from Last 1 Encounters:  11/03/17 5\' 7"  (1.702 m)   Physical exam: Exam: Gen: NAD, conversant, tangential                 CV: RRR, no MRG. No Carotid Bruits. No peripheral edema, warm, nontender Eyes: Conjunctivae clear without exudates or hemorrhage  Neuro: Detailed Neurologic Exam  Speech:    Speech is normal Cognition:    The patient is oriented to person, place, and time;     recent and remote memory impaired;     language fluent;     Impaired attention, concentration,  fund of knowledge Cranial Nerves:    The pupils are equal, round, and reactive to light. Attempted fundoscopic exam could not visualize.  Visual fields are full to finger confrontation. Extraocular movements are intact. Trigeminal sensation is intact and the muscles of mastication are normal. The face is symmetric. The palate elevates in the midline. Hearing impaired. Voice is normal. Shoulder shrug is normal. The tongue has normal motion without fasciculations.   Coordination:    Normal finger to nose  Gait:    Wide based and shuffling   Motor Observation: No resting tremor or action.  No asymmetry, no atrophy, and no involuntary movements noted. Tone:    Normal muscle tone.  No cogwheeling.   Posture:    Posture is slightly stooped    Strength: Mild right leg flexion weakness     Sensation: intact to LT     Reflex Exam:  DTR's:    Absent right patellar and AJ.   Toes:    The toes are downgoing bilaterally.   Clonus:    Clonus is absent.       Assessment/Plan:   He has had symptoms for years, tingling in his right big, feels like electrical  shocks but what has worsened and painful and spreading up the groin, in the groin it hurts, he has to put a pillow between his legs when he sleeps, extra strength tylenol helps, he feels weakness in his leg. The groin pain is NOT associated with the tingling in the toes but feels pain in the anterior thigh. It goes and comes, continuous every day and worsening. He has fallen.    Right Groin pain: Consider evaluation of his right hip as this is not associated with the tingling in his toes, per pcp Right foot tingling and anterior thigh pain, progressive, weakness, falls: MRI lumbar spine and emg/ncs of bilateral lowers. Exam shows weakness right leg, need MRI lumbar spine for eval of ESI or surgical options for his progressive weakness, falls Meningioma: Following with Dr. Ronnald Ramp Neurosurgery, recommend follow up within 6 weeks Falls: Needs physical therapy for gait and balance evaluation,  Fall risk: discussed precautions He had surgery in the past which affected his right leg, symptoms may be secondary to his previous back surgery or something new. He declines physical therapy even with encouragement  Cc:Dr. Shelia Media Orders Placed This Encounter  Procedures  . MR LUMBAR SPINE WO CONTRAST  . NCV with EMG(electromyography)    Sarina Ill, MD  [facility] [address] [place], [place] [address]  Phone [phone] Fax [phone]

## 2017-11-03 NOTE — Patient Instructions (Addendum)
MRI lumbar spine EMG/NCS  Electromyoneurogram Electromyoneurogram is a test to check how well your muscles and nerves are working. This procedure includes the combined use of electromyogram (EMG) and nerve conduction study (NCS). EMG is used to look for muscular disorders. NCS, which is also called electroneurogram, measures how well your nerves are controlling your muscles. The procedures are usually performed together to check if your muscles and nerves are healthy. If the reaction to testing is abnormal, this can indicate disease or injury, such as peripheral nerve damage. Tell a health care provider about:  Any allergies you have.  All medicines you are taking, including vitamins, herbs, eye drops, creams, and over-the-counter medicines.  Any problems you or family members have had with anesthetic medicines.  Any blood disorders you have.  Any surgeries you have had.  Any medical conditions you have.  Any pacemaker you have. What are the risks? Generally, this is a safe procedure. However, problems may occur, including:  Infection where the electrodes were inserted.  Bleeding.  What happens before the procedure?  Ask your health care provider about: ? Changing or stopping your regular medicines. This is especially important if you are taking diabetes medicines or blood thinners. ? Taking medicines such as aspirin and ibuprofen. These medicines can thin your blood. Do not take these medicines before your procedure if your health care provider instructs you not to.  Your health care provider may ask you to avoid: ? Caffeine, such as coffee and tea. ? Nicotine. This includes cigarettes and anything with tobacco.  Do not use lotions or creams on the same day that you will be having the procedure. What happens during the procedure? For EMG:  Your health care provider will ask you to stay in a position so that he or she can access the muscle that will be studied. You may be  standing, sitting down, or lying down.  You may be given a medicine that numbs the area (local anesthetic).  A very thin needle that has an electrode on it will be inserted into your muscle.  Another small electrode will be placed on your skin near the muscle.  Your health care provider will ask you to continue to remain still.  The electrodes will send a signal that tells about the electrical activity of your muscles. You may see this on a monitor or hear it in the room.  After your muscles have been studied at rest, your health care provider will ask you to contract or flex your muscles. The electrodes will send a signal that tells about the electrical activity of your muscles.  Your health care provider will remove the electrodes and the electrode needles when the procedure is finished. The procedure may vary among health care providers and hospitals. For NCS:  An electrode that records your nerve activity (recording electrode) will be placed on your skin by the muscle that is being studied.  An electrode that is used as a reference (reference electrode) will be placed near the recording electrode.  A paste or gel will be applied to your skin between the recording electrode and the reference electrode.  Your nerve will be stimulated with a mild shock. Your health care provider will measure how much time it takes for your muscle to react.  Your health care provider will remove the electrodes and the gel when the procedure is finished. The procedure may vary among health care providers and hospitals. What happens after the procedure?  It is your  responsibility to obtain your test results. Ask your health care provider or the department performing the test when and how you will get your results.  Your health care provider may: ? Give you medicines for any pain. ? Monitor the insertion sites to make sure that they stop bleeding. This information is not intended to replace advice  given to you by your health care provider. Make sure you discuss any questions you have with your health care provider. Document Released: 10/03/2004 Document Revised: 11/08/2015 Document Reviewed: 07/24/2014 Elsevier Interactive Patient Education  2018 [place] in the Home Falls can cause injuries. They can happen to people of all ages. There are many things you can do to make your home safe and to help prevent falls. What can I do on the outside of my home?  Regularly fix the edges of walkways and driveways and fix any cracks.  Remove anything that might make you trip as you walk through a door, such as a raised step or threshold.  Trim any bushes or trees on the path to your home.  Use bright outdoor lighting.  Clear any walking paths of anything that might make someone trip, such as rocks or tools.  Regularly check to see if handrails are loose or broken. Make sure that both sides of any steps have handrails.  Any raised decks and porches should have guardrails on the edges.  Have any leaves, snow, or ice cleared regularly.  Use sand or salt on walking paths during winter.  Clean up any spills in your garage right away. This includes oil or grease spills. What can I do in the bathroom?  Use night lights.  Install grab bars by the toilet and in the tub and shower. Do not use towel bars as grab bars.  Use non-skid mats or decals in the tub or shower.  If you need to sit down in the shower, use a plastic, non-slip stool.  Keep the floor dry. Clean up any water that spills on the floor as soon as it happens.  Remove soap buildup in the tub or shower regularly.  Attach bath mats securely with double-sided non-slip rug tape.  Do not have throw rugs and other things on the floor that can make you trip. What can I do in the bedroom?  Use night lights.  Make sure that you have a light by your bed that is easy to reach.  Do not use any sheets  or blankets that are too big for your bed. They should not hang down onto the floor.  Have a firm chair that has side arms. You can use this for support while you get dressed.  Do not have throw rugs and other things on the floor that can make you trip. What can I do in the kitchen?  Clean up any spills right away.  Avoid walking on wet floors.  Keep items that you use a lot in easy-to-reach places.  If you need to reach something above you, use a strong step stool that has a grab bar.  Keep electrical cords out of the way.  Do not use floor polish or wax that makes floors slippery. If you must use wax, use non-skid floor wax.  Do not have throw rugs and other things on the floor that can make you trip. What can I do with my stairs?  Do not leave any items on the stairs.  Make sure that there  are handrails on both sides of the stairs and use them. Fix handrails that are broken or loose. Make sure that handrails are as long as the stairways.  Check any carpeting to make sure that it is firmly attached to the stairs. Fix any carpet that is loose or worn.  Avoid having throw rugs at the top or bottom of the stairs. If you do have throw rugs, attach them to the floor with carpet tape.  Make sure that you have a light switch at the top of the stairs and the bottom of the stairs. If you do not have them, ask someone to add them for you. What else can I do to help prevent falls?  Wear shoes that: ? Do not have high heels. ? Have rubber bottoms. ? Are comfortable and fit you well. ? Are closed at the toe. Do not wear sandals.  If you use a stepladder: ? Make sure that it is fully opened. Do not climb a closed stepladder. ? Make sure that both sides of the stepladder are locked into place. ? Ask someone to hold it for you, if possible.  Clearly mark and make sure that you can see: ? Any grab bars or handrails. ? First and last steps. ? Where the edge of each step is.  Use  tools that help you move around (mobility aids) if they are needed. These include: ? Canes. ? Walkers. ? Scooters. ? Crutches.  Turn on the lights when you go into a dark area. Replace any light bulbs as soon as they burn out.  Set up your furniture so you have a clear path. Avoid moving your furniture around.  If any of your floors are uneven, fix them.  If there are any pets around you, be aware of where they are.  Review your medicines with your doctor. Some medicines can make you feel dizzy. This can increase your chance of falling. Ask your doctor what other things that you can do to help prevent falls. This information is not intended to replace advice given to you by your health care provider. Make sure you discuss any questions you have with your health care provider. Document Released: 03/29/2009 Document Revised: 11/08/2015 Document Reviewed: 07/07/2014 Elsevier Interactive Patient Education  [employer].

## 2017-11-03 NOTE — Telephone Encounter (Signed)
[employer] medic[employer] order sent to [facility]. [facility] will obtain the auth and they will reach out to the pt to schedule.

## 2017-11-05 ENCOUNTER — Telehealth: Payer: Self-pay | Admitting: Neurology

## 2017-11-05 NOTE — Telephone Encounter (Signed)
error 

## 2017-11-17 NOTE — Telephone Encounter (Signed)
Jasmine with [employer] called to inform us that MRI authorization denied. Peer to peer can be completed by June 8th please call [phone].

## 2017-11-18 ENCOUNTER — Telehealth: Payer: Self-pay | Admitting: Neurology

## 2017-11-18 DIAGNOSIS — M5416 Radiculopathy, lumbar region: Secondary | ICD-10-CM

## 2017-11-18 DIAGNOSIS — M48062 Spinal stenosis, lumbar region with neurogenic claudication: Secondary | ICD-10-CM

## 2017-11-18 NOTE — Telephone Encounter (Signed)
The case number if you wanted to call to do a peer to peer is [id number].

## 2017-11-18 NOTE — Telephone Encounter (Signed)
Can you call and tell me why it was denied please? If I know the denial in advance it helps me gather information. thanks

## 2017-11-18 NOTE — Telephone Encounter (Signed)
[email or web address]  re: Holland Falling called  re: MRI for pt stating it is pending denial. Thomasena Edis stated there is a request for additional medical records and he offered a Peer to peer  When calling back please ref. [id number].

## 2017-11-18 NOTE — Telephone Encounter (Signed)
"  Based on [facility] Coverage determination and [employer] spine imaging guidelines, we cannot approve this request. MRI mri be supported in the evaluation of suspected or known spinal disease with one of the following. Failure to improve after a recent (within 3 months) 6 week trial of provider-directed treatment with clinical re-evaluation or any sings or symptoms such as significant motor weakness, malignancy, infection, cauda equina syndrome, for which conservative treatment is not needed"

## 2017-11-19 ENCOUNTER — Other Ambulatory Visit: Payer: Medicare HMO

## 2017-11-19 ENCOUNTER — Ambulatory Visit
Admission: RE | Admit: 2017-11-19 | Discharge: 2017-11-19 | Disposition: A | Discharge status: Home / Self Care | Payer: Medicare HMO | Source: Ambulatory Visit | Attending: Neurology | Admitting: Neurology

## 2017-11-19 DIAGNOSIS — R200 Anesthesia of skin: Secondary | ICD-10-CM

## 2017-11-19 DIAGNOSIS — R29898 Other symptoms and signs involving the musculoskeletal system: Secondary | ICD-10-CM

## 2017-11-19 DIAGNOSIS — M5416 Radiculopathy, lumbar region: Secondary | ICD-10-CM

## 2017-11-19 DIAGNOSIS — M79604 Pain in right leg: Secondary | ICD-10-CM | POA: Diagnosis not present

## 2017-11-19 DIAGNOSIS — W19XXXA Unspecified fall, initial encounter: Secondary | ICD-10-CM

## 2017-11-23 DIAGNOSIS — N138 Other obstructive and reflux uropathy: Secondary | ICD-10-CM | POA: Diagnosis not present

## 2017-11-23 DIAGNOSIS — N3020 Other chronic cystitis without hematuria: Secondary | ICD-10-CM | POA: Diagnosis not present

## 2017-11-23 DIAGNOSIS — N401 Enlarged prostate with lower urinary tract symptoms: Secondary | ICD-10-CM | POA: Diagnosis not present

## 2017-11-23 DIAGNOSIS — N529 Male erectile dysfunction, unspecified: Secondary | ICD-10-CM | POA: Diagnosis not present

## 2017-11-23 NOTE — Telephone Encounter (Signed)
Patient paid it all out of his pocket.

## 2017-11-23 NOTE — Telephone Encounter (Signed)
He paid for the exam out of pocket.

## 2017-11-23 NOTE — Telephone Encounter (Signed)
[name] [name] @ [employer] re: [name] [name] has called to inform of Denial as of 06-08 for the MRI of the Lumbar Spine. A call back can be made to [phone]

## 2017-11-23 NOTE — Telephone Encounter (Signed)
Called pt and discussed his MRI results. He is aware that his symptoms are likely caused by the nerve pinching and degeneration in the lumbar spine. He agreed to be evaluated by neurosurgeon and he requested Dr. Sherley Bounds @ [facility]. Also pt in agreement to cancel the appointment for the EMG/NCS. Pt verbalized appreciation.    Result Notes for MR LUMBAR SPINE WO CONTRAST   Notes recorded by Melvenia Beam, MD on 11/22/2017 at 10:22 AM EDT Patient has significant lumbar degeneration and pinched nerves. These changes have significantly worsened/ progressed since last MRi in 2006. He needs to be evaluated at neurosurgery as these are likely the causes of his symptoms. You can cancel the emg/ncs and refer him to neurosuregry if he is amenable. thanks

## 2017-11-23 NOTE — Addendum Note (Signed)
Addended by: Gildardo Griffes on: 11/23/2017 04:42 PM   Modules accepted: Orders

## 2017-11-23 NOTE — Telephone Encounter (Signed)
[name] [name], patient already had the MRI how did that happen? Was he approved or not? When I saw he had it I assumed they got my clinical notes and approved it. How did he get it done without approval?

## 2017-11-23 NOTE — Telephone Encounter (Signed)
So should I call the peer to peer?

## 2017-11-23 NOTE — Telephone Encounter (Signed)
Pt requesting a call to discuss his MRI results.

## 2017-11-24 NOTE — Telephone Encounter (Signed)
I faxed referral to [facility] for Dr. Ronnald Ramp.

## 2017-12-08 ENCOUNTER — Ambulatory Visit: Payer: Medicare HMO | Admitting: Diagnostic Neuroimaging

## 2017-12-08 DIAGNOSIS — M545 Low back pain: Secondary | ICD-10-CM | POA: Diagnosis not present

## 2017-12-08 DIAGNOSIS — Z6826 Body mass index (BMI) 26.0-26.9, adult: Secondary | ICD-10-CM | POA: Diagnosis not present

## 2017-12-10 ENCOUNTER — Encounter: Payer: Medicare HMO | Admitting: Neurology

## 2018-02-11 DIAGNOSIS — M545 Low back pain: Secondary | ICD-10-CM | POA: Diagnosis not present

## 2018-02-11 DIAGNOSIS — M961 Postlaminectomy syndrome, not elsewhere classified: Secondary | ICD-10-CM | POA: Diagnosis not present

## 2018-02-11 DIAGNOSIS — M48062 Spinal stenosis, lumbar region with neurogenic claudication: Secondary | ICD-10-CM | POA: Diagnosis not present

## 2018-02-17 DIAGNOSIS — G9389 Other specified disorders of brain: Secondary | ICD-10-CM | POA: Diagnosis not present

## 2018-02-17 DIAGNOSIS — R413 Other amnesia: Secondary | ICD-10-CM | POA: Diagnosis not present

## 2018-02-17 DIAGNOSIS — G936 Cerebral edema: Secondary | ICD-10-CM | POA: Diagnosis not present

## 2018-02-17 DIAGNOSIS — D329 Benign neoplasm of meninges, unspecified: Secondary | ICD-10-CM | POA: Diagnosis not present

## 2018-02-17 DIAGNOSIS — D320 Benign neoplasm of cerebral meninges: Secondary | ICD-10-CM | POA: Diagnosis not present

## 2018-02-17 DIAGNOSIS — R479 Unspecified speech disturbances: Secondary | ICD-10-CM | POA: Diagnosis not present

## 2018-02-24 DIAGNOSIS — M545 Low back pain: Secondary | ICD-10-CM | POA: Diagnosis not present

## 2018-02-24 DIAGNOSIS — M48062 Spinal stenosis, lumbar region with neurogenic claudication: Secondary | ICD-10-CM | POA: Diagnosis not present

## 2018-02-24 DIAGNOSIS — M961 Postlaminectomy syndrome, not elsewhere classified: Secondary | ICD-10-CM | POA: Diagnosis not present

## 2018-03-08 ENCOUNTER — Encounter (INDEPENDENT_AMBULATORY_CARE_PROVIDER_SITE_OTHER): Payer: Self-pay

## 2018-03-08 ENCOUNTER — Ambulatory Visit: Payer: Medicare HMO | Admitting: Cardiovascular Disease

## 2018-03-08 ENCOUNTER — Encounter: Payer: Self-pay | Admitting: Cardiovascular Disease

## 2018-03-08 VITALS — BP 158/62 | HR 55 | Ht 67.0 in | Wt 168.1 lb

## 2018-03-08 DIAGNOSIS — I10 Essential (primary) hypertension: Secondary | ICD-10-CM | POA: Diagnosis not present

## 2018-03-08 DIAGNOSIS — I5032 Chronic diastolic (congestive) heart failure: Secondary | ICD-10-CM

## 2018-03-08 DIAGNOSIS — I2510 Atherosclerotic heart disease of native coronary artery without angina pectoris: Secondary | ICD-10-CM

## 2018-03-08 MED ORDER — AMLODIPINE BESYLATE 10 MG PO TABS: 10.0000 mg | ORAL_TABLET | Freq: Every day | ORAL | 3 refills | Status: DC

## 2018-03-08 NOTE — Patient Instructions (Signed)
Medication Instructions:  Your physician has recommended you make the following change in your medication:   INCREASE Amlodipine (Norvasc) to 10 mg daily   Labwork: None Ordered   Testing/Procedures: None Ordered   Follow-Up: Your physician wants you to follow-up in: 1 year with Dr. Acie Fredrickson. You will receive a reminder letter in the mail two months in advance. If you don't receive a letter, please call our office to schedule the follow-up appointment.   If you need a refill on your cardiac medications before your next appointment, please call your pharmacy.   Thank you for choosing [facility]! Christen Bame, RN [phone]

## 2018-03-08 NOTE — Progress Notes (Signed)
Cardiology Office Note   Date:  03/08/2018   ID:  [name] [name], DOB 09/12/1924, MRN [id number]  PCP:  Deland Pretty, MD  Cardiologist:   Mertie Moores, MD   No chief complaint on file.  1. Moderate coronary artery disease by heart catheterization 20 years ago 2. Hyperlipidemia 3. Hypertension 4. Mild AI 5, Mild TR  History of Present Illness:  [name] [name] is a 84 y.o. gentleman with the above noted hx. He still has some ankle edema that he is concerned about. He has not had any chest pain. He is still very active and healthy.  Dec. 23, 2014:  [name] [name] has done well. No CP. No dyspnea or syncope. Still moderately active. He walks some. He does some stretching. He wants to start bowling again. He does have some symptoms when he first lies down or sits up. ? Vertigo vs. orthostasis hyotension.   03/28/2014:  [name] [name] is doing ok His wife died last year.  He stopped his amlodipine earlier this year - insurance would not pay for it so he stopped it. BP has been OK since that time Tries to watch his salt. Still eats canned veggies and other salty food.    Oct 20, 2014:  [name] [name] is a 84 y.o. male who presents for  Follow up for his CHf  May 24 ,2017:  [name] [name] is seen for his HTN and chronic diastolic CHF No CP or dyspnea.  Had some mid sternal CP several weeks ago - has resolved now  Sept. 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   It is growing slowly  No CP or dyspnea.  Has some occasional leg pain .  Has some neuropathy also   He has stopped his Atorvastatin - his recent lipid levels were slightly elevated.  He was concerned about possible side effect.     Past Medical History:  Diagnosis Date  . Allergic rhinitis   . Amnesia   . BPH (benign prostatic hyperplasia)   . Bradycardia   . Colon polyps   . Coronary  artery disease    MODERATE  . Dizziness   . Edema of lower extremity   . Erectile dysfunction   . H. pylori infection    Hx of   . Hard of hearing   . Headache(784.0)   . History of colon polyps 2009   Dr. Orr/Colonoscopy -small cecal adenoma  . Hx of colonoscopy 2009   Dr Lajoyce Corners, hemorrhoids & polyps  . Hyperlipidemia   . Hypertension   . Internal hemorrhoids 2009   Dr. Lajoyce Corners Colonoscopy  . Mitral valve regurgitation   . Reflux esophagitis     Past Surgical History:  Procedure Laterality Date  . APPENDECTOMY    . CARDIAC CATHETERIZATION  1997   REVEALED MILD TO MODERATE IRREGULARITIES. HIS LEFT EVNTRICULAR SYSTOLIC FUNCTION REVEALED NORMAL EJECTION FRACTION WITH EF OF 70%  . CATARACT EXTRACTION, BILATERAL    . COLONOSCOPY  multiple  . fatty tumor removal     benign  . HEMORROIDECTOMY    . HIP ARTHROPLASTY Left 08/17/2013   Procedure: ARTHROPLASTY BIPOLAR HIP;  Surgeon: Gearlean Alf, MD;  Location: [facility];  Service: Orthopedics;  Laterality: Left;  . INGUINAL HERNIA REPAIR Bilateral 2006  . lower back surgery  2006  . NASAL SEPTUM SURGERY    . TONSILLECTOMY    .  UPPER GASTROINTESTINAL ENDOSCOPY       Current Outpatient Medications  Medication Sig Dispense Refill  . acetaminophen (TYLENOL) 500 MG tablet Take 500 mg by mouth as needed.    . furosemide (LASIX) 40 MG tablet Take 40 mg by mouth 2 (two) times daily as needed.     Marland Kitchen lisinopril-hydrochlorothiazide (PRINZIDE,ZESTORETIC) 20-25 MG tablet Take 1 tablet by mouth daily.    . Multiple Vitamin (MULTIVITAMIN) tablet Take 1 tablet by mouth daily.      Marland Kitchen NIACIN PO Take 500 mg by mouth daily. Mon, tues, thurs sat    . OVER THE COUNTER MEDICATION Take 1 capsule by mouth daily. Vitamin D & Fish Oil in one large capsule    . rosuvastatin (CRESTOR) 5 MG tablet Take 5 mg by mouth. Takes rarely    . vitamin B-12 (CYANOCOBALAMIN) 1000 MCG tablet Take 1,000 mcg by mouth daily.    Marland Kitchen zinc gluconate 50 MG tablet Take 100 mg by mouth  3 (three) times a week. Take on Mon wed and fridays    . amLODipine (NORVASC) 10 MG tablet Take 1 tablet (10 mg total) by mouth daily. 90 tablet 3   No current facility-administered medications for this visit.     Allergies:   Patient has no known allergies.    Social History:  The patient  reports that he quit smoking about 42 years ago. He has never used smokeless tobacco. He reports that he drinks alcohol. He reports that he does not use drugs.   Family History:  The patient's family history includes Breast cancer in his sister; Colon cancer (age of onset: 78) in his mother.    ROS:  Noted in current hx    Physical Exam: Blood pressure (!) 158/62, pulse (!) 55, height 5\' 7"  (1.702 m), weight 168 lb 1.9 oz (76.3 kg), SpO2 96 %.  GEN:   Elderly male,   Walks slowly , hard of hearing  HEENT: Normal NECK: No JVD; No carotid bruits LYMPHATICS: No lymphadenopathy CARDIAC: RRR  RESPIRATORY:  Clear to auscultation without rales, wheezing or rhonchi  ABDOMEN: Soft, non-tender, non-distended MUSCULOSKELETAL:  No edema; No deformity  SKIN: Warm and dry NEUROLOGIC:  Very hard of hearing .  Walks very slowly    EKG:    Sept. 23, 2019:  Sinus brady at 63.   No changes   Recent Labs: No results found for requested labs within last 8760 hours.    Lipid Panel    Component Value Date/Time   CHOL 194 02/17/2017 0820   TRIG 87 02/17/2017 0820   HDL 72 02/17/2017 0820   CHOLHDL 2.7 02/17/2017 0820   CHOLHDL 2.2 10/30/2015 0737   VLDL 11 10/30/2015 0737   LDLCALC 105 (H) 02/17/2017 0820      Wt Readings from Last 3 Encounters:  03/08/18 168 lb 1.9 oz (76.3 kg)  11/03/17 169 lb (76.7 kg)  10/28/17 168 lb (76.2 kg)      Other studies Reviewed: Additional studies/ records that were reviewed today include: . Review of the above records demonstrates:    ASSESSMENT AND PLAN:  1. Moderate coronary artery disease by heart catheterization 20 years ago, no angina   2.  Hyperlipidemia-   Last labs were drawn in April, 2019: These looked great.  Continue to follow-up with his primary medical doctor.  3. Hypertension -  BP remains elevated.   We will increase his amlodipine to 10 mg a day.  Follow-up with his primary medical doctor.  4. Balance issues:   Has improved.    5.  Brain meningioma Has some short term memory issues. Followed by primary MD and neuro   5. Mild AI 6, Mild TR 7. Chronic diastolic congestive heart failure-he remains very stable. Continue current medications.   Current medicines are reviewed at length with the patient today.  The patient does not have concerns regarding medicines.  The following changes have been made:  no change  Labs/ tests ordered today include:   Orders Placed This Encounter  Procedures  . EKG 12-Lead    Continue same meds.  Disposition:   FU with me in 1 year     Mertie Moores, MD  03/08/2018 10:25 AM    [address], [place], [place]  [address] Phone: [phone]; Fax: [phone]

## 2018-03-17 DIAGNOSIS — D329 Benign neoplasm of meninges, unspecified: Secondary | ICD-10-CM | POA: Diagnosis not present

## 2018-03-17 DIAGNOSIS — R4181 Age-related cognitive decline: Secondary | ICD-10-CM | POA: Diagnosis not present

## 2018-03-17 DIAGNOSIS — G936 Cerebral edema: Secondary | ICD-10-CM | POA: Diagnosis not present

## 2018-04-01 DIAGNOSIS — I10 Essential (primary) hypertension: Secondary | ICD-10-CM | POA: Diagnosis not present

## 2018-04-01 DIAGNOSIS — E785 Hyperlipidemia, unspecified: Secondary | ICD-10-CM | POA: Diagnosis not present

## 2018-04-06 DIAGNOSIS — R001 Bradycardia, unspecified: Secondary | ICD-10-CM | POA: Diagnosis not present

## 2018-04-06 DIAGNOSIS — D72820 Lymphocytosis (symptomatic): Secondary | ICD-10-CM | POA: Diagnosis not present

## 2018-04-06 DIAGNOSIS — I779 Disorder of arteries and arterioles, unspecified: Secondary | ICD-10-CM | POA: Diagnosis not present

## 2018-04-06 DIAGNOSIS — Z23 Encounter for immunization: Secondary | ICD-10-CM | POA: Diagnosis not present

## 2018-04-06 DIAGNOSIS — N529 Male erectile dysfunction, unspecified: Secondary | ICD-10-CM | POA: Diagnosis not present

## 2018-04-06 DIAGNOSIS — I2510 Atherosclerotic heart disease of native coronary artery without angina pectoris: Secondary | ICD-10-CM | POA: Diagnosis not present

## 2018-04-06 DIAGNOSIS — Z0001 Encounter for general adult medical examination with abnormal findings: Secondary | ICD-10-CM | POA: Diagnosis not present

## 2018-04-06 DIAGNOSIS — E785 Hyperlipidemia, unspecified: Secondary | ICD-10-CM | POA: Diagnosis not present

## 2018-04-06 DIAGNOSIS — Z7982 Long term (current) use of aspirin: Secondary | ICD-10-CM | POA: Diagnosis not present

## 2018-04-06 DIAGNOSIS — I5032 Chronic diastolic (congestive) heart failure: Secondary | ICD-10-CM | POA: Diagnosis not present

## 2018-06-18 DIAGNOSIS — D320 Benign neoplasm of cerebral meninges: Secondary | ICD-10-CM | POA: Diagnosis not present

## 2018-06-18 DIAGNOSIS — D329 Benign neoplasm of meninges, unspecified: Secondary | ICD-10-CM | POA: Diagnosis not present

## 2018-06-18 DIAGNOSIS — Z510 Encounter for antineoplastic radiation therapy: Secondary | ICD-10-CM | POA: Diagnosis not present

## 2018-07-06 DIAGNOSIS — H35363 Drusen (degenerative) of macula, bilateral: Secondary | ICD-10-CM | POA: Diagnosis not present

## 2018-07-06 DIAGNOSIS — H26492 Other secondary cataract, left eye: Secondary | ICD-10-CM | POA: Diagnosis not present

## 2018-07-06 DIAGNOSIS — H43811 Vitreous degeneration, right eye: Secondary | ICD-10-CM | POA: Diagnosis not present

## 2018-07-06 DIAGNOSIS — Z961 Presence of intraocular lens: Secondary | ICD-10-CM | POA: Diagnosis not present

## 2018-08-08 ENCOUNTER — Emergency Department (HOSPITAL_COMMUNITY): Payer: Medicare HMO

## 2018-08-08 ENCOUNTER — Emergency Department (HOSPITAL_COMMUNITY)
Admission: EM | Admit: 2018-08-08 | Discharge: 2018-08-09 | Disposition: A | Discharge status: Home / Self Care | Payer: Medicare HMO | Attending: Emergency Medicine | Admitting: Emergency Medicine

## 2018-08-08 ENCOUNTER — Other Ambulatory Visit: Payer: Self-pay

## 2018-08-08 ENCOUNTER — Encounter (HOSPITAL_COMMUNITY): Payer: Self-pay | Admitting: *Deleted

## 2018-08-08 DIAGNOSIS — W010XXA Fall on same level from slipping, tripping and stumbling without subsequent striking against object, initial encounter: Secondary | ICD-10-CM | POA: Diagnosis not present

## 2018-08-08 DIAGNOSIS — R410 Disorientation, unspecified: Secondary | ICD-10-CM | POA: Diagnosis not present

## 2018-08-08 DIAGNOSIS — Z87891 Personal history of nicotine dependence: Secondary | ICD-10-CM | POA: Insufficient documentation

## 2018-08-08 DIAGNOSIS — S8991XA Unspecified injury of right lower leg, initial encounter: Secondary | ICD-10-CM | POA: Diagnosis not present

## 2018-08-08 DIAGNOSIS — Z79899 Other long term (current) drug therapy: Secondary | ICD-10-CM | POA: Insufficient documentation

## 2018-08-08 DIAGNOSIS — Z96642 Presence of left artificial hip joint: Secondary | ICD-10-CM | POA: Diagnosis not present

## 2018-08-08 DIAGNOSIS — W19XXXA Unspecified fall, initial encounter: Secondary | ICD-10-CM

## 2018-08-08 DIAGNOSIS — I2510 Atherosclerotic heart disease of native coronary artery without angina pectoris: Secondary | ICD-10-CM | POA: Diagnosis not present

## 2018-08-08 DIAGNOSIS — N3000 Acute cystitis without hematuria: Secondary | ICD-10-CM

## 2018-08-08 DIAGNOSIS — I5032 Chronic diastolic (congestive) heart failure: Secondary | ICD-10-CM | POA: Diagnosis not present

## 2018-08-08 DIAGNOSIS — S199XXA Unspecified injury of neck, initial encounter: Secondary | ICD-10-CM | POA: Diagnosis not present

## 2018-08-08 DIAGNOSIS — S80211A Abrasion, right knee, initial encounter: Secondary | ICD-10-CM | POA: Diagnosis not present

## 2018-08-08 DIAGNOSIS — R404 Transient alteration of awareness: Secondary | ICD-10-CM | POA: Diagnosis not present

## 2018-08-08 DIAGNOSIS — M25561 Pain in right knee: Secondary | ICD-10-CM | POA: Diagnosis not present

## 2018-08-08 DIAGNOSIS — M542 Cervicalgia: Secondary | ICD-10-CM | POA: Diagnosis not present

## 2018-08-08 DIAGNOSIS — I110 Hypertensive heart disease with heart failure: Secondary | ICD-10-CM | POA: Insufficient documentation

## 2018-08-08 DIAGNOSIS — Y92129 Unspecified place in nursing home as the place of occurrence of the external cause: Secondary | ICD-10-CM | POA: Insufficient documentation

## 2018-08-08 DIAGNOSIS — Y9301 Activity, walking, marching and hiking: Secondary | ICD-10-CM | POA: Insufficient documentation

## 2018-08-08 DIAGNOSIS — S0990XA Unspecified injury of head, initial encounter: Secondary | ICD-10-CM | POA: Diagnosis not present

## 2018-08-08 DIAGNOSIS — Y998 Other external cause status: Secondary | ICD-10-CM | POA: Diagnosis not present

## 2018-08-08 DIAGNOSIS — I10 Essential (primary) hypertension: Secondary | ICD-10-CM | POA: Diagnosis not present

## 2018-08-08 NOTE — ED Provider Notes (Signed)
[facility] DEPT Provider Note   CSN: [id number] Arrival date & time: 08/08/18  2119    History   Chief Complaint Chief Complaint  Patient presents with  . Fall  . Altered Mental Status    HPI [name] [name] is a 85 y.o. male.     The history is provided by the patient, a relative and medical records. No language interpreter was used.  Fall  This is a new problem. The current episode started 1 to 2 hours ago. The problem occurs rarely. The problem has been resolved. Pertinent negatives include no chest pain, no abdominal pain, no headaches and no shortness of breath. Nothing aggravates the symptoms. Nothing relieves the symptoms. He has tried nothing for the symptoms. The treatment provided no relief.    Past Medical History:  Diagnosis Date  . Allergic rhinitis   . Amnesia   . BPH (benign prostatic hyperplasia)   . Bradycardia   . Colon polyps   . Coronary artery disease    MODERATE  . Dizziness   . Edema of lower extremity   . Erectile dysfunction   . H. pylori infection    Hx of   . Hard of hearing   . Headache(784.0)   . History of colon polyps 2009   Dr. Orr/Colonoscopy -small cecal adenoma  . Hx of colonoscopy 2009   Dr Lajoyce Corners, hemorrhoids & polyps  . Hyperlipidemia   . Hypertension   . Internal hemorrhoids 2009   Dr. Lajoyce Corners Colonoscopy  . Mitral valve regurgitation   . Reflux esophagitis     Patient Active Problem List   Diagnosis Date Noted  . Right leg weakness 11/03/2017  . Bleeding internal hemorrhoids 10/28/2017  . Chronic diastolic heart failure ([place]) 08/17/2013  . Femur fracture, left ([place]) 08/16/2013  . Bradycardia, severe sinus 10/30/2011  . Personal history of colonic adenoma 01/09/2011  . Family history of malignant neoplasm of colon cancer-mother 01/09/2011  . Coronary artery disease   . Hyperlipidemia   . Hypertension   . KGURKYHC(623.7)     Past Surgical History:  Procedure Laterality Date  .  APPENDECTOMY    . CARDIAC CATHETERIZATION  1997   REVEALED MILD TO MODERATE IRREGULARITIES. HIS LEFT EVNTRICULAR SYSTOLIC FUNCTION REVEALED NORMAL EJECTION FRACTION WITH EF OF 70%  . CATARACT EXTRACTION, BILATERAL    . COLONOSCOPY  multiple  . fatty tumor removal     benign  . HEMORROIDECTOMY    . HIP ARTHROPLASTY Left 08/17/2013   Procedure: ARTHROPLASTY BIPOLAR HIP;  Surgeon: Gearlean Alf, MD;  Location: [facility];  Service: Orthopedics;  Laterality: Left;  . INGUINAL HERNIA REPAIR Bilateral 2006  . lower back surgery  2006  . NASAL SEPTUM SURGERY    . TONSILLECTOMY    . UPPER GASTROINTESTINAL ENDOSCOPY          Home Medications    Prior to Admission medications   Medication Sig Start Date End Date Taking? Authorizing Provider  acetaminophen (TYLENOL) 500 MG tablet Take 500 mg by mouth as needed.    [provider]  amLODipine (NORVASC) 10 MG tablet Take 1 tablet (10 mg total) by mouth daily. 03/08/18   [name], [name], MD  furosemide (LASIX) 40 MG tablet Take 40 mg by mouth 2 (two) times daily as needed.     [provider]  lisinopril-hydrochlorothiazide (PRINZIDE,ZESTORETIC) 20-25 MG tablet Take 1 tablet by mouth daily.    [provider]  Multiple Vitamin (MULTIVITAMIN) tablet Take 1  tablet by mouth daily.      [provider]  NIACIN PO Take 500 mg by mouth daily. Mon, tues, thurs sat    [provider]  OVER THE COUNTER MEDICATION Take 1 capsule by mouth daily. Vitamin D & Fish Oil in one large capsule    [provider]  rosuvastatin (CRESTOR) 5 MG tablet Take 5 mg by mouth. Takes rarely    [provider]  vitamin B-12 (CYANOCOBALAMIN) 1000 MCG tablet Take 1,000 mcg by mouth daily.    [provider]  zinc gluconate 50 MG tablet Take 100 mg by mouth 3 (three) times a week. Take on Mon wed and fridays    [provider]    Family History Family History  Problem Relation Age of Onset  . Colon  cancer Mother 25       second cancer in 20's deceased after emergency surgery  . Breast cancer Sister     Social History Social History   Tobacco Use  . Smoking status: Former Smoker    Last attempt to quit: 10/25/1975    Years since quitting: 42.8  . Smokeless tobacco: Never Used  Substance Use Topics  . Alcohol use: Yes    Comment: occ 6-7 times a year  . Drug use: No     Allergies   Patient has no known allergies.   Review of Systems Review of Systems  Constitutional: Negative for activity change, chills, diaphoresis, fatigue and fever.  HENT: Negative for congestion and rhinorrhea.   Eyes: Negative for visual disturbance.  Respiratory: Negative for cough, chest tightness, shortness of breath, wheezing and stridor.   Cardiovascular: Negative for chest pain, palpitations and leg swelling.  Gastrointestinal: Negative for abdominal distention, abdominal pain, blood in stool, constipation, diarrhea, nausea and vomiting.  Genitourinary: Negative for difficulty urinating, dysuria and flank pain.  Musculoskeletal: Negative for back pain, gait problem, neck pain and neck stiffness.  Skin: Negative for rash and wound.  Neurological: Negative for dizziness, weakness, light-headedness and headaches.  Psychiatric/Behavioral: Negative for agitation.  All other systems reviewed and are negative.    Physical Exam Updated Vital Signs BP (!) 191/59 (BP Location: Right Arm)   Pulse 85   Temp 98.1 F (36.7 C) (Oral)   Resp 16   SpO2 98%   Physical Exam Vitals signs and nursing note reviewed.  Constitutional:      General: He is not in acute distress.    Appearance: He is well-developed. He is not ill-appearing, toxic-appearing or diaphoretic.  HENT:     Head: Normocephalic and atraumatic.     Nose: No congestion or rhinorrhea.     Mouth/Throat:     Pharynx: No oropharyngeal exudate or posterior oropharyngeal erythema.  Eyes:     Extraocular Movements: Extraocular movements  intact.     Conjunctiva/sclera: Conjunctivae normal.     Pupils: Pupils are equal, round, and reactive to light.  Neck:     Musculoskeletal: Neck supple. No muscular tenderness.  Cardiovascular:     Rate and Rhythm: Normal rate and regular rhythm.     Heart sounds: No murmur.  Pulmonary:     Effort: Pulmonary effort is normal. No respiratory distress.     Breath sounds: Normal breath sounds. No wheezing, rhonchi or rales.  Chest:     Chest wall: No tenderness.  Abdominal:     General: Abdomen is flat. There is no distension.     Palpations: Abdomen is soft.  Tenderness: There is no abdominal tenderness.  Musculoskeletal:        General: Tenderness present. No deformity.     Right lower leg: No edema.     Left lower leg: No edema.  Skin:    General: Skin is warm and dry.     Capillary Refill: Capillary refill takes less than 2 seconds.     Findings: No erythema or rash.  Neurological:     General: No focal deficit present.     Mental Status: He is alert.     Sensory: No sensory deficit.     Motor: No weakness.  Psychiatric:        Mood and Affect: Mood normal.      ED Treatments / Results  Labs (all labs ordered are listed, but only abnormal results are displayed) Labs Reviewed  URINALYSIS, ROUTINE W REFLEX MICROSCOPIC - Abnormal; Notable for the following components:      Result Value   Hgb urine dipstick MODERATE (*)    Ketones, ur 80 (*)    Protein, ur 30 (*)    Leukocytes,Ua SMALL (*)    Bacteria, UA RARE (*)    All other components within normal limits  URINE CULTURE    EKG None  Radiology Ct Head Wo Contrast  Result Date: 08/08/2018 CLINICAL DATA:  85 y/o  M; fall, head trauma, neck pain, confusion. EXAM: CT HEAD WITHOUT CONTRAST CT CERVICAL SPINE WITHOUT CONTRAST TECHNIQUE: Multidetector CT imaging of the head and cervical spine was performed following the standard protocol without intravenous contrast. Multiplanar CT image reconstructions of the  cervical spine were also generated. COMPARISON:  07/21/2017 MRI head. FINDINGS: CT HEAD FINDINGS Brain: No acute stroke, hemorrhage, extra-axial collection, hydrocephalus, or herniation lobulated meningioma along the medial aspect of the left middle cranial fossa as well as hypoattenuation throughout the left anterior temporal lobe likely representing associated edema is stable in comparison with the prior MRI of the brain given differences in technique. Nonspecific white matter hypodensities are compatible with chronic microvascular ischemic changes and there is volume loss of the brain which is also stable. Vascular: Calcific atherosclerosis of carotid siphons and vertebral arteries. Skull: Normal. Negative for fracture or focal lesion. Sinuses/Orbits: Mild paranasal sinus mucosal thickening. Normal aeration of mastoid air cells. Bilateral intra-ocular lens replacement. Other: None. CT CERVICAL SPINE FINDINGS Alignment: Mild reversal of cervical curvature with apex at C5. No listhesis. Skull base and vertebrae: No acute fracture. No primary bone lesion or focal pathologic process. Soft tissues and spinal canal: No prevertebral fluid or swelling. No visible canal hematoma. Disc levels: Loss of intervertebral disc space height greatest at the C3-4 and C6-7 levels. Right-sided C3-C5 as well as left-sided C4-C6 facet fusion. Uncovertebral and facet hypertrophy result in bony neural foraminal encroachment at the right C3-4 and bilateral C6-7 levels. Mild spinal canal stenosis at C6-7. Upper chest: Negative. Other: 21 mm nodule in the left lobe of the thyroid gland. IMPRESSION: CT head: 1. No acute intracranial abnormality identified. 2. Stable left middle cranial fossa meningioma and left anterior temporal lobe edema given differences in technique. 3. Stable chronic microvascular ischemic changes and volume loss of the brain. CT CERVICAL SPINE:. 1. No acute fracture or dislocation identified. 2. Moderate cervical  spondylosis greatest at C3-4 and C6-7 levels. 3. 21 mm nodule in left lobe of thyroid gland. Further evaluation with thyroid ultrasound is recommended on a nonemergent basis. Electronically Signed   By: Kristine Garbe M.D.   On: 08/08/2018 23:20  Ct Cervical Spine Wo Contrast  Result Date: 08/08/2018 CLINICAL DATA:  85 y/o  M; fall, head trauma, neck pain, confusion. EXAM: CT HEAD WITHOUT CONTRAST CT CERVICAL SPINE WITHOUT CONTRAST TECHNIQUE: Multidetector CT imaging of the head and cervical spine was performed following the standard protocol without intravenous contrast. Multiplanar CT image reconstructions of the cervical spine were also generated. COMPARISON:  07/21/2017 MRI head. FINDINGS: CT HEAD FINDINGS Brain: No acute stroke, hemorrhage, extra-axial collection, hydrocephalus, or herniation lobulated meningioma along the medial aspect of the left middle cranial fossa as well as hypoattenuation throughout the left anterior temporal lobe likely representing associated edema is stable in comparison with the prior MRI of the brain given differences in technique. Nonspecific white matter hypodensities are compatible with chronic microvascular ischemic changes and there is volume loss of the brain which is also stable. Vascular: Calcific atherosclerosis of carotid siphons and vertebral arteries. Skull: Normal. Negative for fracture or focal lesion. Sinuses/Orbits: Mild paranasal sinus mucosal thickening. Normal aeration of mastoid air cells. Bilateral intra-ocular lens replacement. Other: None. CT CERVICAL SPINE FINDINGS Alignment: Mild reversal of cervical curvature with apex at C5. No listhesis. Skull base and vertebrae: No acute fracture. No primary bone lesion or focal pathologic process. Soft tissues and spinal canal: No prevertebral fluid or swelling. No visible canal hematoma. Disc levels: Loss of intervertebral disc space height greatest at the C3-4 and C6-7 levels. Right-sided C3-C5 as well  as left-sided C4-C6 facet fusion. Uncovertebral and facet hypertrophy result in bony neural foraminal encroachment at the right C3-4 and bilateral C6-7 levels. Mild spinal canal stenosis at C6-7. Upper chest: Negative. Other: 21 mm nodule in the left lobe of the thyroid gland. IMPRESSION: CT head: 1. No acute intracranial abnormality identified. 2. Stable left middle cranial fossa meningioma and left anterior temporal lobe edema given differences in technique. 3. Stable chronic microvascular ischemic changes and volume loss of the brain. CT CERVICAL SPINE:. 1. No acute fracture or dislocation identified. 2. Moderate cervical spondylosis greatest at C3-4 and C6-7 levels. 3. 21 mm nodule in left lobe of thyroid gland. Further evaluation with thyroid ultrasound is recommended on a nonemergent basis. Electronically Signed   By: Kristine Garbe M.D.   On: 08/08/2018 23:20   Dg Knee Complete 4 Views Right  Result Date: 08/08/2018 CLINICAL DATA:  Fall, right knee pain EXAM: RIGHT KNEE - COMPLETE 4+ VIEW COMPARISON:  None. FINDINGS: No evidence of fracture, dislocation, or joint effusion. No evidence of arthropathy or other focal bone abnormality. Soft tissues are unremarkable. IMPRESSION: Negative. Electronically Signed   By: Rolm Baptise M.D.   On: 08/08/2018 22:19    Procedures Procedures (including critical care time)  Medications Ordered in ED Medications  cephALEXin (KEFLEX) capsule 500 mg (500 mg Oral Given 08/09/18 0036)     Initial Impression / Assessment and Plan / ED Course  I have reviewed the triage vital signs and the nursing notes.  Pertinent labs & imaging results that were available during my care of the patient were reviewed by me and considered in my medical decision making (see chart for details).        DELL BRINER is a 85 y.o. male with a past medical history significant for CAD, CHF, hypertension, hyperlipidemia, and meningioma status post gamma knife surgery  who presents with an unwitnessed fall and mild confusion.  Patient reports that he is feeling normal and is thinking clearly.  He reports he thinks he had a fall today and EMS report was  that he was found to have fallen over at a nursing facility.  Patient was brought in with c-collar in place for injury rule out.  Patient abrasion on his right knee.  Patient's daughter came to the bedside eventually and reports that he is acting with very mild confusion similar to when he said urinary tract infection in the past.  Patient denies fevers, chills, congestion, cough, chest pain, shortness breath, palpitations, nausea, vomiting, constipation, or diarrhea.  He denies any urinary symptoms on my evaluation.  He is reporting mild right knee pain.  No significant headaches, vision changes, neck pain, or neck stiffness.  On exam, lungs clear chest nontender.  Abdomen nontender.  Patient moving all extremities.  Patient has abrasion to his right knee with mild tenderness.  Patient had no evidence of significant trauma on his head.  Due to patient's recent gamma knife and the confusion with has not had injury, patient had CT head and neck which were reassuring.  C-collar removed.  X-ray of the knee showed no fracture dislocation.  Daughter requested urinalysis as he "is acting the same" as his prior UTI.  Urinalysis shows no evidence of contamination with no epithelial cells but does show leukocytes and significant bacteria.  Patient was treated for UTI similar to prior.  Patient given dose of Keflex and will follow-up with his PCP.  Given lack of any respiratory symptoms or other abnormalities, do not feel he needs further work-up in the emergency department at this time.  Patient and family agree with plan of care and understands return precautions and follow-up instructions.  Patient no other questions or concerns and was discharged in good condition.   Final Clinical Impressions(s) / ED Diagnoses   Final  diagnoses:  Acute cystitis without hematuria  Confusion  Fall, initial encounter  Acute pain of right knee  Abrasion of right knee, initial encounter    ED Discharge Orders         Ordered    cephALEXin (KEFLEX) 500 MG capsule  2 times daily     08/09/18 0018         Clinical Impression: 1. Acute cystitis without hematuria   2. Confusion   3. Fall, initial encounter   4. Acute pain of right knee   5. Abrasion of right knee, initial encounter     Disposition: Discharge  Condition: Good  I have discussed the results, Dx and Tx plan with the pt(& family if present). He/she/they expressed understanding and agree(s) with the plan. Discharge instructions discussed at great length. Strict return precautions discussed and pt &/or family have verbalized understanding of the instructions. No further questions at time of discharge.    Discharge Medication List as of 08/09/2018 12:19 AM    START taking these medications   Details  cephALEXin (KEFLEX) 500 MG capsule Take 1 capsule (500 mg total) by mouth 2 (two) times daily for 7 days., Starting Mon 08/09/2018, Until Mon 08/16/2018, Print        Follow Up: Deland Pretty, MD [address] [place] [id number] [phone]     [facility] [place] [phone] [name]       Pang Robers, Gwenyth Allegra, MD 08/09/18 0100

## 2018-08-08 NOTE — ED Notes (Signed)
Bed: WA06 Expected date:  Expected time:  Means of arrival:  Comments: EMS 85 yo male from independent living-fall-normally confused-brain tumor which he is getting radiation for

## 2018-08-08 NOTE — ED Triage Notes (Signed)
Pt bib EMS and coming from [facility].  Pt presents to ED after an unwitnessed fall.  Pt appeared to be walking and then fell over.  EMS didn't find any obvious injuries and it is not believed that pt had LOC.  Unsure if pt takes blood thinners as a med list could not be obtained.  Pt is alert with a recently dx brain tumor and facility is unaware what his baseline is but know he babbles all the time.  Pt is not oriented to time but is able to follow commands. Pt did report some knee pain but the abrasions appear old.  EMS EKG was unremarkable. Hx HTN and is probably non-compliant with medications.

## 2018-08-09 DIAGNOSIS — I2510 Atherosclerotic heart disease of native coronary artery without angina pectoris: Secondary | ICD-10-CM | POA: Diagnosis not present

## 2018-08-09 DIAGNOSIS — S80211A Abrasion, right knee, initial encounter: Secondary | ICD-10-CM | POA: Diagnosis not present

## 2018-08-09 DIAGNOSIS — I5032 Chronic diastolic (congestive) heart failure: Secondary | ICD-10-CM | POA: Diagnosis not present

## 2018-08-09 DIAGNOSIS — R410 Disorientation, unspecified: Secondary | ICD-10-CM | POA: Diagnosis not present

## 2018-08-09 DIAGNOSIS — Z87891 Personal history of nicotine dependence: Secondary | ICD-10-CM | POA: Diagnosis not present

## 2018-08-09 DIAGNOSIS — N3000 Acute cystitis without hematuria: Secondary | ICD-10-CM | POA: Diagnosis not present

## 2018-08-09 DIAGNOSIS — M542 Cervicalgia: Secondary | ICD-10-CM | POA: Diagnosis not present

## 2018-08-09 DIAGNOSIS — I110 Hypertensive heart disease with heart failure: Secondary | ICD-10-CM | POA: Diagnosis not present

## 2018-08-09 DIAGNOSIS — Z96642 Presence of left artificial hip joint: Secondary | ICD-10-CM | POA: Diagnosis not present

## 2018-08-09 DIAGNOSIS — W010XXA Fall on same level from slipping, tripping and stumbling without subsequent striking against object, initial encounter: Secondary | ICD-10-CM | POA: Diagnosis not present

## 2018-08-09 LAB — URINALYSIS, ROUTINE W REFLEX MICROSCOPIC
Bilirubin Urine: NEGATIVE
Glucose, UA: NEGATIVE mg/dL
Ketones, ur: 80 mg/dL — AB
Nitrite: NEGATIVE
Protein, ur: 30 mg/dL — AB
Specific Gravity, Urine: 1.023 (ref 1.005–1.030)
pH: 5 (ref 5.0–8.0)

## 2018-08-09 MED ORDER — CEPHALEXIN 500 MG PO CAPS: 500.0000 mg | ORAL_CAPSULE | Freq: Two times a day (BID) | ORAL | 0 refills | Status: AC

## 2018-08-09 MED ORDER — CEPHALEXIN 500 MG PO CAPS
500.0000 mg | ORAL_CAPSULE | Freq: Once | ORAL | Status: AC
  Administered 2018-08-09: 500 mg via ORAL
  Filled 2018-08-09: qty 1

## 2018-08-09 NOTE — Discharge Instructions (Signed)
Your work-up today showed no acute traumatic injuries.  The x-ray of your knee showed no fracture dislocation.  The CT of your head and neck showed no fracture or dislocation in your neck and showed no intracranial injury.  Your urine did show evidence of urinary tract infection especially with the mild confusion you are having similar to prior altered mental status with UTI.  Please take the antibiotics for the next week and follow-up with your primary doctor.  If any symptoms change or worsen, please return to the nearest emergency department.  Please be careful not to fall.

## 2018-08-11 LAB — URINE CULTURE: Culture: >=100000 — AB

## 2018-08-12 ENCOUNTER — Telehealth: Payer: Self-pay | Admitting: *Deleted

## 2018-08-12 NOTE — Telephone Encounter (Signed)
Post ED Visit - Positive Culture Follow-up  Culture report reviewed by antimicrobial stewardship pharmacist: [facility] Team []  Elenor Quinones, Pharm.D. []  Heide Guile, Pharm.D., BCPS AQ-ID []  Parks Neptune, Pharm.D., BCPS []  Alycia Rossetti, Pharm.D., BCPS []  [place], [place].D., BCPS, AAHIVP []  Legrand Como, Pharm.D., BCPS, AAHIVP []  Salome Arnt, PharmD, BCPS []  Johnnette Gourd, PharmD, BCPS []  Hughes Better, PharmD, BCPS []  Leeroy Cha, PharmD []  Laqueta Linden, PharmD, BCPS []  Albertina Parr, PharmD  [place] Team []  Leodis Sias, PharmD []  Lindell Spar, PharmD []  Royetta Asal, PharmD []  Graylin Shiver, Rph []  Rema Fendt) Glennon Mac, PharmD []  Arlyn Dunning, PharmD []  Netta Cedars, PharmD []  Dia Sitter, PharmD []  Leone Haven, PharmD []  Gretta Arab, PharmD []  Theodis Shove, PharmD [x]  Peggyann Juba, PharmD []  Reuel Boom, PharmD   Positive urine culture Treated with Cephalexin, organism sensitive to the same and no further patient follow-up is required at this time.  Harlon Flor [facility] 08/12/2018, 12:43 PM

## 2018-10-12 DIAGNOSIS — K625 Hemorrhage of anus and rectum: Secondary | ICD-10-CM | POA: Diagnosis not present

## 2018-10-25 ENCOUNTER — Ambulatory Visit: Payer: Medicare HMO | Admitting: Internal Medicine

## 2018-11-01 ENCOUNTER — Encounter (HOSPITAL_COMMUNITY): Payer: Self-pay | Admitting: Emergency Medicine

## 2018-11-01 ENCOUNTER — Inpatient Hospital Stay (HOSPITAL_COMMUNITY)
Admission: EM | Admit: 2018-11-01 | Discharge: 2018-11-04 | DRG: 871 | Disposition: A | Discharge status: Skilled Nursing Facility | Payer: Medicare HMO | Source: Skilled Nursing Facility | Attending: Internal Medicine | Admitting: Internal Medicine

## 2018-11-01 ENCOUNTER — Emergency Department (HOSPITAL_COMMUNITY): Payer: Medicare HMO

## 2018-11-01 ENCOUNTER — Other Ambulatory Visit: Payer: Self-pay

## 2018-11-01 DIAGNOSIS — I5032 Chronic diastolic (congestive) heart failure: Secondary | ICD-10-CM | POA: Diagnosis present

## 2018-11-01 DIAGNOSIS — R569 Unspecified convulsions: Secondary | ICD-10-CM | POA: Diagnosis not present

## 2018-11-01 DIAGNOSIS — Z7401 Bed confinement status: Secondary | ICD-10-CM | POA: Diagnosis not present

## 2018-11-01 DIAGNOSIS — I340 Nonrheumatic mitral (valve) insufficiency: Secondary | ICD-10-CM | POA: Diagnosis not present

## 2018-11-01 DIAGNOSIS — E785 Hyperlipidemia, unspecified: Secondary | ICD-10-CM | POA: Diagnosis present

## 2018-11-01 DIAGNOSIS — E161 Other hypoglycemia: Secondary | ICD-10-CM | POA: Diagnosis not present

## 2018-11-01 DIAGNOSIS — Z66 Do not resuscitate: Secondary | ICD-10-CM | POA: Diagnosis present

## 2018-11-01 DIAGNOSIS — I440 Atrioventricular block, first degree: Secondary | ICD-10-CM | POA: Diagnosis present

## 2018-11-01 DIAGNOSIS — R404 Transient alteration of awareness: Secondary | ICD-10-CM | POA: Diagnosis not present

## 2018-11-01 DIAGNOSIS — E786 Lipoprotein deficiency: Secondary | ICD-10-CM | POA: Diagnosis not present

## 2018-11-01 DIAGNOSIS — D320 Benign neoplasm of cerebral meninges: Secondary | ICD-10-CM | POA: Diagnosis present

## 2018-11-01 DIAGNOSIS — A419 Sepsis, unspecified organism: Secondary | ICD-10-CM | POA: Diagnosis not present

## 2018-11-01 DIAGNOSIS — Z87891 Personal history of nicotine dependence: Secondary | ICD-10-CM

## 2018-11-01 DIAGNOSIS — N400 Enlarged prostate without lower urinary tract symptoms: Secondary | ICD-10-CM | POA: Diagnosis present

## 2018-11-01 DIAGNOSIS — E876 Hypokalemia: Secondary | ICD-10-CM | POA: Diagnosis present

## 2018-11-01 DIAGNOSIS — M150 Primary generalized (osteo)arthritis: Secondary | ICD-10-CM | POA: Diagnosis not present

## 2018-11-01 DIAGNOSIS — G936 Cerebral edema: Secondary | ICD-10-CM | POA: Diagnosis not present

## 2018-11-01 DIAGNOSIS — Z7189 Other specified counseling: Secondary | ICD-10-CM

## 2018-11-01 DIAGNOSIS — I2510 Atherosclerotic heart disease of native coronary artery without angina pectoris: Secondary | ICD-10-CM | POA: Diagnosis present

## 2018-11-01 DIAGNOSIS — M2550 Pain in unspecified joint: Secondary | ICD-10-CM | POA: Diagnosis not present

## 2018-11-01 DIAGNOSIS — J189 Pneumonia, unspecified organism: Secondary | ICD-10-CM

## 2018-11-01 DIAGNOSIS — G9341 Metabolic encephalopathy: Secondary | ICD-10-CM | POA: Diagnosis present

## 2018-11-01 DIAGNOSIS — R1310 Dysphagia, unspecified: Secondary | ICD-10-CM | POA: Diagnosis not present

## 2018-11-01 DIAGNOSIS — Z20828 Contact with and (suspected) exposure to other viral communicable diseases: Secondary | ICD-10-CM | POA: Diagnosis not present

## 2018-11-01 DIAGNOSIS — R2681 Unsteadiness on feet: Secondary | ICD-10-CM | POA: Diagnosis not present

## 2018-11-01 DIAGNOSIS — R32 Unspecified urinary incontinence: Secondary | ICD-10-CM | POA: Diagnosis not present

## 2018-11-01 DIAGNOSIS — M25662 Stiffness of left knee, not elsewhere classified: Secondary | ICD-10-CM | POA: Diagnosis not present

## 2018-11-01 DIAGNOSIS — I110 Hypertensive heart disease with heart failure: Secondary | ICD-10-CM | POA: Diagnosis present

## 2018-11-01 DIAGNOSIS — R4182 Altered mental status, unspecified: Secondary | ICD-10-CM | POA: Diagnosis not present

## 2018-11-01 DIAGNOSIS — R918 Other nonspecific abnormal finding of lung field: Secondary | ICD-10-CM | POA: Diagnosis not present

## 2018-11-01 DIAGNOSIS — R29898 Other symptoms and signs involving the musculoskeletal system: Secondary | ICD-10-CM | POA: Diagnosis not present

## 2018-11-01 DIAGNOSIS — R1312 Dysphagia, oropharyngeal phase: Secondary | ICD-10-CM | POA: Diagnosis not present

## 2018-11-01 DIAGNOSIS — I4891 Unspecified atrial fibrillation: Secondary | ICD-10-CM | POA: Diagnosis not present

## 2018-11-01 DIAGNOSIS — Z9181 History of falling: Secondary | ICD-10-CM | POA: Diagnosis not present

## 2018-11-01 DIAGNOSIS — I10 Essential (primary) hypertension: Secondary | ICD-10-CM | POA: Diagnosis not present

## 2018-11-01 DIAGNOSIS — N3946 Mixed incontinence: Secondary | ICD-10-CM | POA: Diagnosis not present

## 2018-11-01 DIAGNOSIS — M6281 Muscle weakness (generalized): Secondary | ICD-10-CM | POA: Diagnosis not present

## 2018-11-01 DIAGNOSIS — Z515 Encounter for palliative care: Secondary | ICD-10-CM | POA: Diagnosis present

## 2018-11-01 DIAGNOSIS — F0390 Unspecified dementia without behavioral disturbance: Secondary | ICD-10-CM | POA: Diagnosis present

## 2018-11-01 DIAGNOSIS — E162 Hypoglycemia, unspecified: Secondary | ICD-10-CM | POA: Diagnosis not present

## 2018-11-01 DIAGNOSIS — R531 Weakness: Secondary | ICD-10-CM | POA: Diagnosis not present

## 2018-11-01 DIAGNOSIS — Z96642 Presence of left artificial hip joint: Secondary | ICD-10-CM | POA: Diagnosis present

## 2018-11-01 DIAGNOSIS — M25661 Stiffness of right knee, not elsewhere classified: Secondary | ICD-10-CM | POA: Diagnosis not present

## 2018-11-01 LAB — BASIC METABOLIC PANEL
Anion gap: 14 (ref 5–15)
BUN: 13 mg/dL (ref 8–23)
CO2: 25 mmol/L (ref 22–32)
Calcium: 9.5 mg/dL (ref 8.9–10.3)
Chloride: 97 mmol/L — ABNORMAL LOW (ref 98–111)
Creatinine, Ser: 1.03 mg/dL (ref 0.61–1.24)
GFR calc Af Amer: >60 mL/min (ref >60)
GFR calc non Af Amer: >60 mL/min (ref >60)
Glucose, Bld: 129 mg/dL — ABNORMAL HIGH (ref 70–99)
Potassium: 3.4 mmol/L — ABNORMAL LOW (ref 3.5–5.1)
Sodium: 136 mmol/L (ref 135–145)

## 2018-11-01 LAB — URINALYSIS, ROUTINE W REFLEX MICROSCOPIC
Bilirubin Urine: NEGATIVE
Glucose, UA: NEGATIVE mg/dL
Hgb urine dipstick: NEGATIVE
Ketones, ur: 20 mg/dL — AB
Leukocytes,Ua: NEGATIVE
Nitrite: NEGATIVE
Protein, ur: NEGATIVE mg/dL
Specific Gravity, Urine: 1.014 (ref 1.005–1.030)
pH: 7 (ref 5.0–8.0)

## 2018-11-01 LAB — HEPATIC FUNCTION PANEL
ALT: 24 U/L (ref 0–44)
AST: 31 U/L (ref 15–41)
Albumin: 4.1 g/dL (ref 3.5–5.0)
Alkaline Phosphatase: 64 U/L (ref 38–126)
Bilirubin, Direct: 0.3 mg/dL — ABNORMAL HIGH (ref 0.0–0.2)
Indirect Bilirubin: 0.9 mg/dL (ref 0.3–0.9)
Total Bilirubin: 1.2 mg/dL (ref 0.3–1.2)
Total Protein: 6.6 g/dL (ref 6.5–8.1)

## 2018-11-01 LAB — CBC WITH DIFFERENTIAL/PLATELET
Abs Immature Granulocytes: 0.06 10*3/uL (ref 0.00–0.07)
Basophils Absolute: 0 10*3/uL (ref 0.0–0.1)
Basophils Relative: 0 %
Eosinophils Absolute: 0 10*3/uL (ref 0.0–0.5)
Eosinophils Relative: 0 %
HCT: 42.9 % (ref 39.0–52.0)
Hemoglobin: 14.9 g/dL (ref 13.0–17.0)
Immature Granulocytes: 0 %
Lymphocytes Relative: 5 %
Lymphs Abs: 0.9 10*3/uL (ref 0.7–4.0)
MCH: 32.7 pg (ref 26.0–34.0)
MCHC: 34.7 g/dL (ref 30.0–36.0)
MCV: 94.3 fL (ref 80.0–100.0)
Monocytes Absolute: 1 10*3/uL (ref 0.1–1.0)
Monocytes Relative: 6 %
Neutro Abs: 15 10*3/uL — ABNORMAL HIGH (ref 1.7–7.7)
Neutrophils Relative %: 89 %
Platelets: 223 10*3/uL (ref 150–400)
RBC: 4.55 MIL/uL (ref 4.22–5.81)
RDW: 12 % (ref 11.5–15.5)
WBC: 16.9 10*3/uL — ABNORMAL HIGH (ref 4.0–10.5)
nRBC: 0 % (ref 0.0–0.2)

## 2018-11-01 LAB — TROPONIN I: Troponin I: 0.06 ng/mL (ref <0.03)

## 2018-11-01 LAB — LACTIC ACID, PLASMA: Lactic Acid, Venous: 1.8 mmol/L (ref 0.5–1.9)

## 2018-11-01 LAB — AMMONIA: Ammonia: 22 umol/L (ref 9–35)

## 2018-11-01 LAB — CK: Total CK: 343 U/L (ref 49–397)

## 2018-11-01 LAB — SARS CORONAVIRUS 2 BY RT PCR (HOSPITAL ORDER, PERFORMED IN ~~LOC~~ HOSPITAL LAB): SARS Coronavirus 2: NEGATIVE

## 2018-11-01 MED ORDER — ACETAMINOPHEN 325 MG PO TABS: 650.0000 mg | ORAL_TABLET | Freq: Four times a day (QID) | ORAL | Status: DC | PRN

## 2018-11-01 MED ORDER — PIPERACILLIN-TAZOBACTAM 3.375 G IVPB
3.3750 g | Freq: Three times a day (TID) | INTRAVENOUS | Status: DC
  Administered 2018-11-01 – 2018-11-04 (×8): 3.375 g via INTRAVENOUS
  Filled 2018-11-01 (×8): qty 50

## 2018-11-01 MED ORDER — VANCOMYCIN HCL 10 G IV SOLR
1500.0000 mg | Freq: Once | INTRAVENOUS | Status: AC
  Administered 2018-11-01: 16:00:00 1500 mg via INTRAVENOUS
  Filled 2018-11-01: qty 1500

## 2018-11-01 MED ORDER — AMLODIPINE BESYLATE 5 MG PO TABS
10.0000 mg | ORAL_TABLET | Freq: Every day | ORAL | Status: DC
  Administered 2018-11-02 – 2018-11-04 (×3): 10 mg via ORAL
  Filled 2018-11-01 (×3): qty 2

## 2018-11-01 MED ORDER — POTASSIUM CHLORIDE 10 MEQ/100ML IV SOLN
10.0000 meq | INTRAVENOUS | Status: AC
  Administered 2018-11-01 (×2): 10 meq via INTRAVENOUS
  Filled 2018-11-01 (×2): qty 100

## 2018-11-01 MED ORDER — VANCOMYCIN HCL IN DEXTROSE 1-5 GM/200ML-% IV SOLN: 1000.0000 mg | Freq: Once | INTRAVENOUS | Status: DC

## 2018-11-01 MED ORDER — VANCOMYCIN HCL IN DEXTROSE 1-5 GM/200ML-% IV SOLN: 1000.0000 mg | INTRAVENOUS | Status: DC

## 2018-11-01 MED ORDER — SODIUM CHLORIDE 0.9 % IV BOLUS
500.0000 mL | Freq: Once | INTRAVENOUS | Status: AC
  Administered 2018-11-01: 500 mL via INTRAVENOUS

## 2018-11-01 MED ORDER — PIPERACILLIN-TAZOBACTAM 4.5 G IVPB: 4.5000 g | Freq: Once | INTRAVENOUS | Status: DC

## 2018-11-01 MED ORDER — SODIUM CHLORIDE 0.9 % IV SOLN
INTRAVENOUS | Status: AC
  Administered 2018-11-01: 22:00:00 via INTRAVENOUS

## 2018-11-01 MED ORDER — ROSUVASTATIN CALCIUM 5 MG PO TABS
5.0000 mg | ORAL_TABLET | Freq: Every day | ORAL | Status: DC
  Administered 2018-11-02 – 2018-11-04 (×3): 5 mg via ORAL
  Filled 2018-11-01 (×3): qty 1

## 2018-11-01 MED ORDER — ENOXAPARIN SODIUM 40 MG/0.4ML ~~LOC~~ SOLN
40.0000 mg | SUBCUTANEOUS | Status: DC
  Administered 2018-11-01 – 2018-11-03 (×3): 40 mg via SUBCUTANEOUS
  Filled 2018-11-01 (×3): qty 0.4

## 2018-11-01 MED ORDER — PIPERACILLIN-TAZOBACTAM 3.375 G IVPB 30 MIN
3.3750 g | Freq: Once | INTRAVENOUS | Status: AC
  Administered 2018-11-01: 16:00:00 3.375 g via INTRAVENOUS
  Filled 2018-11-01: qty 50

## 2018-11-01 NOTE — ED Triage Notes (Signed)
Pt was found staff abt 1315 lying in between the bed and floor on his left side. Pt was last seen normal abt 25 hours. Pt is nonverbal which atypical for him. Staff states his normal is he usually ambulates and talks.

## 2018-11-01 NOTE — ED Notes (Signed)
ED TO INPATIENT HANDOFF REPORT  ED Nurse Name and Phone #:  Patty [phone]  S Name/Age/Gender [name] [name] 85 y.o. male Room/Bed: 032C/032C  Code Status   Code Status: Full Code  Home/SNF/Other Nursing Home Pt awake but will not answer questions which is abnormal for him. He is normally AOx4.  Is this baseline? No   Triage Complete: Triage complete  Chief Complaint non verbal  Triage Note Pt was found staff abt 1315 lying in between the bed and floor on his left side. Pt was last seen normal abt 25 hours. Pt is nonverbal which atypical for him. Staff states his normal is he usually ambulates and talks.    Allergies No Known Allergies  Level of Care/Admitting Diagnosis ED Disposition    ED Disposition Condition Comment   Admit  Hospital Area: [facility] [[id number]]  Level of Care: Telemetry Medical [104]  Covid Evaluation: N/A  Diagnosis: Multifocal pneumonia [[id number]]  Admitting Physician: Shela Leff [[id number]]  Attending Physician: Shela Leff [[id number]]  Estimated length of stay: past midnight tomorrow  Certification:: I certify this patient will need inpatient services for at least 2 midnights  PT Class (Do Not Modify): Inpatient [101]  PT Acc Code (Do Not Modify): Private [1]       B Medical/Surgery History Past Medical History:  Diagnosis Date  . Allergic rhinitis   . Amnesia   . BPH (benign prostatic hyperplasia)   . Bradycardia   . Colon polyps   . Coronary artery disease    MODERATE  . Dizziness   . Edema of lower extremity   . Erectile dysfunction   . H. pylori infection    Hx of   . Hard of hearing   . Headache(784.0)   . History of colon polyps 2009   Dr. Orr/Colonoscopy -small cecal adenoma  . Hx of colonoscopy 2009   Dr Lajoyce Corners, hemorrhoids & polyps  . Hyperlipidemia   . Hypertension   . Internal hemorrhoids 2009   Dr. Lajoyce Corners Colonoscopy  . Mitral valve regurgitation   . Reflux esophagitis    Past  Surgical History:  Procedure Laterality Date  . APPENDECTOMY    . CARDIAC CATHETERIZATION  1997   REVEALED MILD TO MODERATE IRREGULARITIES. HIS LEFT EVNTRICULAR SYSTOLIC FUNCTION REVEALED NORMAL EJECTION FRACTION WITH EF OF 70%  . CATARACT EXTRACTION, BILATERAL    . COLONOSCOPY  multiple  . fatty tumor removal     benign  . HEMORROIDECTOMY    . HIP ARTHROPLASTY Left 08/17/2013   Procedure: ARTHROPLASTY BIPOLAR HIP;  Surgeon: Gearlean Alf, MD;  Location: [facility];  Service: Orthopedics;  Laterality: Left;  . INGUINAL HERNIA REPAIR Bilateral 2006  . lower back surgery  2006  . NASAL SEPTUM SURGERY    . TONSILLECTOMY    . UPPER GASTROINTESTINAL ENDOSCOPY       A IV Location/Drains/Wounds Patient Lines/Drains/Airways Status   Active Line/Drains/Airways    Name:   Placement date:   Placement time:   Site:   Days:   Peripheral IV 11/01/18 Left Forearm   11/01/18    1345    Forearm   less than 1   Closed System Drain 1 Left Hip Accordion (Hemovac) 8 Fr.   08/17/13    1723    Hip   1902   Incision (Closed) 08/17/13 Hip Left   08/17/13    1731     1902          Intake/Output Last 24 hours  Intake/Output Summary (Last 24 hours) at 11/01/2018 2117 Last data filed at 11/01/2018 1749 Gross per 24 hour  Intake 500 ml  Output -  Net 500 ml    Labs/Imaging Results for orders placed or performed during the hospital encounter of 11/01/18 (from the past 48 hour(s))  Urinalysis, Routine w reflex microscopic     Status: Abnormal   Collection Time: 11/01/18  2:00 PM  Result Value Ref Range   Color, Urine YELLOW YELLOW   APPearance CLEAR CLEAR   Specific Gravity, Urine 1.014 1.005 - 1.030   pH 7.0 5.0 - 8.0   Glucose, UA NEGATIVE NEGATIVE mg/dL   Hgb urine dipstick NEGATIVE NEGATIVE   Bilirubin Urine NEGATIVE NEGATIVE   Ketones, ur 20 (A) NEGATIVE mg/dL   Protein, ur NEGATIVE NEGATIVE mg/dL   Nitrite NEGATIVE NEGATIVE   Leukocytes,Ua NEGATIVE NEGATIVE    Comment: Performed at [facility] [address], [place], [place] [address]  CK     Status: None   Collection Time: 11/01/18  2:05 PM  Result Value Ref Range   Total CK 343 49 - 397 U/L    Comment: Performed at [facility], [place] [address] [place], [place] [address]  Basic metabolic panel     Status: Abnormal   Collection Time: 11/01/18  2:05 PM  Result Value Ref Range   Sodium 136 135 - 145 mmol/L   Potassium 3.4 (L) 3.5 - 5.1 mmol/L   Chloride 97 (L) 98 - 111 mmol/L   CO2 25 22 - 32 mmol/L   Glucose, Bld 129 (H) 70 - 99 mg/dL   BUN 13 8 - 23 mg/dL   Creatinine, Ser 1.03 0.61 - 1.24 mg/dL   Calcium 9.5 8.9 - 10.3 mg/dL   GFR calc non Af Amer >60 >60 mL/min   GFR calc Af Amer >60 >60 mL/min   Anion gap 14 5 - 15    Comment: Performed at [facility], [place] [address], [place], [place] [address]  CBC with Differential     Status: Abnormal   Collection Time: 11/01/18  2:05 PM  Result Value Ref Range   WBC 16.9 (H) 4.0 - 10.5 K/uL   RBC 4.55 4.22 - 5.81 MIL/uL   Hemoglobin 14.9 13.0 - 17.0 g/dL   HCT 42.9 39.0 - 52.0 %   MCV 94.3 80.0 - 100.0 fL   MCH 32.7 26.0 - 34.0 pg   MCHC 34.7 30.0 - 36.0 g/dL   RDW 12.0 11.5 - 15.5 %   Platelets 223 150 - 400 K/uL   nRBC 0.0 0.0 - 0.2 %   Neutrophils Relative % 89 %   Neutro Abs 15.0 (H) 1.7 - 7.7 K/uL   Lymphocytes Relative 5 %   Lymphs Abs 0.9 0.7 - 4.0 K/uL   Monocytes Relative 6 %   Monocytes Absolute 1.0 0.1 - 1.0 K/uL   Eosinophils Relative 0 %   Eosinophils Absolute 0.0 0.0 - 0.5 K/uL   Basophils Relative 0 %   Basophils Absolute 0.0 0.0 - 0.1 K/uL   Immature Granulocytes 0 %   Abs Immature Granulocytes 0.06 0.00 - 0.07 K/uL    Comment: Performed at [facility] [address], [place], [place] [address]  Hepatic function panel     Status: Abnormal   Collection Time: 11/01/18  4:24 PM  Result Value Ref Range   Total Protein 6.6 6.5 - 8.1 g/dL   Albumin 4.1 3.5 -  5.0 g/dL   AST 31 15 - 41 U/L   ALT 24 0 - 44 U/L    Alkaline Phosphatase 64 38 - 126 U/L   Total Bilirubin 1.2 0.3 - 1.2 mg/dL   Bilirubin, Direct 0.3 (H) 0.0 - 0.2 mg/dL   Indirect Bilirubin 0.9 0.3 - 0.9 mg/dL    Comment: Performed at [facility] [address] [place], [place] [address]  Lactic acid, plasma     Status: None   Collection Time: 11/01/18  4:24 PM  Result Value Ref Range   Lactic Acid, Venous 1.8 0.5 - 1.9 mmol/L    Comment: Performed at [facility] [address], [place], [place] [address]  Ammonia     Status: None   Collection Time: 11/01/18  4:24 PM  Result Value Ref Range   Ammonia 22 9 - 35 umol/L    Comment: Performed at [facility], [place] [address], [place], [place] [address]  SARS Coronavirus 2 (CEPHEID- Performed in [facility] hospital lab), Hosp Order     Status: None   Collection Time: 11/01/18  4:49 PM  Result Value Ref Range   SARS Coronavirus 2 NEGATIVE NEGATIVE    Comment: (NOTE) If result is NEGATIVE SARS-CoV-2 target nucleic acids are NOT DETECTED. The SARS-CoV-2 RNA is generally detectable in upper and lower  respiratory specimens during the acute phase of infection. The lowest  concentration of SARS-CoV-2 viral copies this assay can detect is 250  copies / mL. A negative result does not preclude SARS-CoV-2 infection  and should not be used as the sole basis for treatment or other  patient management decisions.  A negative result may occur with  improper specimen collection / handling, submission of specimen other  than nasopharyngeal swab, presence of viral mutation(s) within the  areas targeted by this assay, and inadequate number of viral copies  (<250 copies / mL). A negative result must be combined with clinical  observations, patient history, and epidemiological information. If result is POSITIVE SARS-CoV-2 target nucleic acids are DETECTED. The SARS-CoV-2 RNA is generally detectable in upper and lower  respiratory specimens dur ing the acute phase of infection.   Positive  results are indicative of active infection with SARS-CoV-2.  Clinical  correlation with patient history and other diagnostic information is  necessary to determine patient infection status.  Positive results do  not rule out bacterial infection or co-infection with other viruses. If result is PRESUMPTIVE POSTIVE SARS-CoV-2 nucleic acids MAY BE PRESENT.   A presumptive positive result was obtained on the submitted specimen  and confirmed on repeat testing.  While 2019 novel coronavirus  (SARS-CoV-2) nucleic acids may be present in the submitted sample  additional confirmatory testing may be necessary for epidemiological  and / or clinical management purposes  to differentiate between  SARS-CoV-2 and other Sarbecovirus currently known to infect humans.  If clinically indicated additional testing with an alternate test  methodology [phone]) is advised. The SARS-CoV-2 RNA is generally  detectable in upper and lower respiratory sp ecimens during the acute  phase of infection. The expected result is Negative. Fact Sheet for Patients:  StrictlyIdeas.no Fact Sheet for Healthcare Providers: BankingDealers.co.za This test is not yet approved or cleared by the [place] FDA and has been authorized for detection and/or diagnosis of SARS-CoV-2 by FDA under an Emergency Use Authorization (EUA).  This EUA will remain in effect (meaning this test can be used) for the duration of the COVID-19  declaration under Section 564(b)(1) of the Act, 21 U.S.C. section 360bbb-3(b)(1), unless the authorization is terminated or revoked sooner. Performed at [facility], [place] [address], [place], [place] [address]   Troponin I - ONCE - STAT     Status: Abnormal   Collection Time: 11/01/18  7:36 PM  Result Value Ref Range   Troponin I 0.06 (HH) <0.03 ng/mL    Comment: CRITICAL RESULT CALLED TO, READ BACK BY AND VERIFIED WITH: P.Artis Buechele RN [id number]  11/01/2018 MCCORMICK K Performed at [facility], [place] [address] [place], [place] [address]    Dg Chest 2 View  Result Date: 11/01/2018 CLINICAL DATA:  85 year old male with altered mental status EXAM: CHEST - 2 VIEW COMPARISON:  10/07/2016 FINDINGS: Cardiomediastinal silhouette unchanged in size and contour. No evidence of central vascular congestion. No pneumothorax. No pleural effusion. Patchy opacities at the bilateral lung bases. No displaced fracture. Degenerative changes of the spine IMPRESSION: Patchy opacities bilateral lungs, potentially developing infection and/or atelectasis. Electronically Signed   By: Corrie Mckusick D.O.   On: 11/01/2018 14:53   Ct Head Wo Contrast  Result Date: 11/01/2018 CLINICAL DATA:  Altered mental status and decreased responsiveness EXAM: CT HEAD WITHOUT CONTRAST TECHNIQUE: Contiguous axial images were obtained from the base of the skull through the vertex without intravenous contrast. COMPARISON:  Brain MRI July 21, 2017; head CT August 08, 2018 FINDINGS: Brain: Mild diffuse atrophy is stable. There remains edema throughout much of the inferior left temporal lobe. A known meningioma arising in the medial left temporal lobe is better appreciated on prior MR. There is subtle increased attenuation in the area of the known meningioma, stable, measuring 2.2 x 0.9 cm. No similar appearing edema elsewhere. No other mass evident. There is no hemorrhage, extra-axial fluid collection, or midline shift. There is patchy small vessel disease in the centra semiovale bilaterally. No acute infarct is demonstrable on this study. Vascular: There is no appreciable hyperdense vessel. There is calcification in the distal left vertebral artery as well as in both carotid siphon regions. Skull: The bony calvarium appears intact. Sinuses/Orbits: There is mucosal thickening in several ethmoid air cells bilaterally. Other visualized paranasal sinuses are clear. Visualized orbits  appear symmetric bilaterally. Other: Mastoid air cells are clear. IMPRESSION: Persistent vasogenic edema in the left temporal lobe adjacent to a known meningioma, not felt to be appreciably changed in size or contour. No new edema evident. No hemorrhage. There is underlying atrophy with periventricular small vessel disease. No evident acute infarct. There are foci of arterial vascular calcification. There is mucosal thickening in several ethmoid air cells bilaterally. Electronically Signed   By: Lowella Grip III M.D.   On: 11/01/2018 15:34    Pending Labs Unresulted Labs (From admission, onward)    Start     Ordered   11/02/18 0500  CBC  Tomorrow morning,   R     11/01/18 2109   11/02/18 6384  Basic metabolic panel  Tomorrow morning,   R     11/01/18 2109   11/01/18 2112  Brain natriuretic peptide  ONCE - STAT,   R     11/01/18 2111   11/01/18 2109  Vitamin B12  Once,   R     11/01/18 2109   11/01/18 2107  Magnesium  Add-on,   R     11/01/18 2109   11/01/18 2107  TSH  Once,   R     11/01/18 2109   11/01/18 2107  T4, free  Once,   R     11/01/18 2109   11/01/18 2103  HIV antibody (Routine Screening)  Once,   R     11/01/18 2109   11/01/18 1620  Blood culture (routine x 2)  BLOOD CULTURE X 2,   STAT     11/01/18 1620          Vitals/Pain Today's Vitals   11/01/18 1800 11/01/18 1815 11/01/18 1900 11/01/18 1944  BP: 121/71  127/66   Pulse: (!) 119 68 64   Resp:  (!) 21 16   Temp:      TempSrc:      SpO2: 95% 98% 96%   Weight:      Height:      PainSc:    0-No pain    Isolation Precautions No active isolations  Medications Medications  amLODipine (NORVASC) tablet 10 mg (has no administration in time range)  rosuvastatin (CRESTOR) tablet 5 mg (has no administration in time range)  enoxaparin (LOVENOX) injection 40 mg (has no administration in time range)  acetaminophen (TYLENOL) tablet 650 mg (has no administration in time range)  potassium chloride 10 mEq in 100 mL  IVPB (has no administration in time range)  sodium chloride 0.9 % bolus 500 mL (has no administration in time range)  0.9 %  sodium chloride infusion (has no administration in time range)  vancomycin (VANCOCIN) 1,500 mg in sodium chloride 0.9 % 500 mL IVPB (0 mg Intravenous Stopped 11/01/18 1749)  piperacillin-tazobactam (ZOSYN) IVPB 3.375 g (0 g Intravenous Stopped 11/01/18 1605)    Mobility walks with device High fall risk   Focused Assessments    R Recommendations: See Admitting Provider Note  Report given to:   Additional Notes:

## 2018-11-01 NOTE — ED Notes (Addendum)
Family Contact: Daughter [name]= [place] = [phone]

## 2018-11-01 NOTE — Progress Notes (Signed)
Pharmacy Antibiotic Note  [name] [name] is a 85 y.o. male admitted on 11/01/2018 with multifocal pneumonia.  Pharmacy has been consulted for vancomycin and zosyn dosing.  Presenting with altered mental status. CXR showing patchy opacities bilaterally. WBC 16.9, LA 1.8, afebrile. Scr 1.03 (CrCl 43 mL/min).   Plan: Zosyn 3.375g IV q8h (4 hour infusion). Vancomycin 1500 mg IV once then 1000 mg IV every 24 hours  Monitor renal fx, cx results, clinical pic, and vanc levels as appropriate  Height: 5\' 7"  (170.2 cm) Weight: 167 lb 8.8 oz (76 kg) IBW/kg (Calculated) : 66.1  Temp (24hrs), Avg:98 F (36.7 C), Min:98 F (36.7 C), Max:98 F (36.7 C)  Recent Labs  Lab 11/01/18 1405 11/01/18 1624  WBC 16.9*  --   CREATININE 1.03  --   LATICACIDVEN  --  1.8    Estimated Creatinine Clearance: 43.7 mL/min (by C-G formula based on SCr of 1.03 mg/dL).    No Known Allergies  Antimicrobials this admission: Vanc 5/18 >>  Zosyn 5/18 >>   Dose adjustments this admission: N/A  Microbiology results: 5/18 BCx: sent 5/18 COVID Cx: neg   Thank you for allowing pharmacy to be a part of this patient's care.  Antonietta Jewel, PharmD, [place] Clinical Pharmacist  Pager: [phone] Phone: [phone] 11/01/2018 9:21 PM

## 2018-11-01 NOTE — ED Notes (Signed)
RN updated daughter Gwinda Passe on the phone.

## 2018-11-01 NOTE — ED Notes (Signed)
This RN gave pt daughter Gwinda Passe updates as much as my scope allowed & told her that the Dr. Lynnda Child call with updates soon.

## 2018-11-01 NOTE — H&P (Addendum)
History and Physical    [name] [name] RJJ:[id number] DOB: 19-Oct-1924 DOA: 11/01/2018  PCP: Deland Pretty, MD Patient coming from: [facility]  Chief Complaint: Altered mental status  HPI: [name] [name] is a 85 y.o. male with medical history significant of amnesia, BPH, bradycardia, coronary artery disease, colon polyps, hypertension, hyperlipidemia, mitral valve regurgitation, diastolic congestive heart failure, reflux esophagitis, and conditions listed below presenting to the hospital for evaluation of altered mental status.  He was found by his nursing home staff at 1315 lying in between the bed and floor on his left side.  He was last seen normal about 25 hours prior to ED arrival.  Found to be nonverbal at triage but at baseline is verbal and able to ambulate.  Patient is nonverbal.  No additional history could be obtained from him.  ED Course: Afebrile, tachycardic, tachypneic, not hypotensive, and not hypoxic.  White count 01.6 with neutrophilic predominance, hemoglobin 14.9, and platelet count 223.  Sodium 136, potassium 3.4, chloride 97, bicarb 25, BUN 13, creatinine 1.0, and glucose 129.  LFTs grossly normal.  Lactic acid normal.  UA positive for ketones but negative for nitrite and leukocyte esterase.  CK within normal range.  Ammonia normal.  Troponin 0.06.  EKG done in the ED is a very poor quality study and cannot be interpreted.  COVID-19 rapid test negative.  Chest x-ray (personally reviewed) showing patchy opacities in bilateral lungs, concerning for developing infection versus atelectasis.  Head CT showing persistent vasogenic edema in the left temporal lobe adjacent to the known meningioma, not felt to be appreciably changed in size or contour.  No new edema evident.  No hemorrhage.  No evident acute infarct. Pending labs: BNP, blood culture x2 Medications administered in the ED: Vancomycin and Zosyn  Review of Systems: All systems reviewed and apart from history of  presenting illness, are negative.  Past Medical History:  Diagnosis Date  . Allergic rhinitis   . Amnesia   . BPH (benign prostatic hyperplasia)   . Bradycardia   . Colon polyps   . Coronary artery disease    MODERATE  . Dizziness   . Edema of lower extremity   . Erectile dysfunction   . H. pylori infection    Hx of   . Hard of hearing   . Headache(784.0)   . History of colon polyps 2009   Dr. Orr/Colonoscopy -small cecal adenoma  . Hx of colonoscopy 2009   Dr Lajoyce Corners, hemorrhoids & polyps  . Hyperlipidemia   . Hypertension   . Internal hemorrhoids 2009   Dr. Lajoyce Corners Colonoscopy  . Mitral valve regurgitation   . Reflux esophagitis     Past Surgical History:  Procedure Laterality Date  . APPENDECTOMY    . CARDIAC CATHETERIZATION  1997   REVEALED MILD TO MODERATE IRREGULARITIES. HIS LEFT EVNTRICULAR SYSTOLIC FUNCTION REVEALED NORMAL EJECTION FRACTION WITH EF OF 70%  . CATARACT EXTRACTION, BILATERAL    . COLONOSCOPY  multiple  . fatty tumor removal     benign  . HEMORROIDECTOMY    . HIP ARTHROPLASTY Left 08/17/2013   Procedure: ARTHROPLASTY BIPOLAR HIP;  Surgeon: Gearlean Alf, MD;  Location: [facility];  Service: Orthopedics;  Laterality: Left;  . INGUINAL HERNIA REPAIR Bilateral 2006  . lower back surgery  2006  . NASAL SEPTUM SURGERY    . TONSILLECTOMY    . UPPER GASTROINTESTINAL ENDOSCOPY       reports that he quit smoking about 43 years ago. He has  never used smokeless tobacco. He reports current alcohol use. He reports that he does not use drugs.  No Known Allergies  Family History  Problem Relation Age of Onset  . Colon cancer Mother 42       second cancer in 68's deceased after emergency surgery  . Breast cancer Sister     Prior to Admission medications   Medication Sig Start Date End Date Taking? Authorizing Provider  amLODipine (NORVASC) 10 MG tablet Take 1 tablet (10 mg total) by mouth daily. 03/08/18  Yes [name], [name], MD  lisinopril-hydrochlorothiazide  (PRINZIDE,ZESTORETIC) 20-25 MG tablet Take 1 tablet by mouth daily.   Yes [provider]  rosuvastatin (CRESTOR) 5 MG tablet Take 5 mg by mouth. Takes rarely   Yes [provider]  acetaminophen (TYLENOL) 500 MG tablet Take 500 mg by mouth as needed.    [provider]  furosemide (LASIX) 40 MG tablet Take 40 mg by mouth 2 (two) times daily as needed.     [provider]  Multiple Vitamin (MULTIVITAMIN) tablet Take 1 tablet by mouth daily.      [provider]  NIACIN PO Take 500 mg by mouth daily. Mon, tues, thurs sat    [provider]  OVER THE COUNTER MEDICATION Take 1 capsule by mouth daily. Vitamin D & Fish Oil in one large capsule    [provider]  vitamin B-12 (CYANOCOBALAMIN) 1000 MCG tablet Take 1,000 mcg by mouth daily.    [provider]  zinc gluconate 50 MG tablet Take 100 mg by mouth 3 (three) times a week. Take on Mon wed and fridays    [provider]    Physical Exam: Vitals:   11/01/18 1800 11/01/18 1815 11/01/18 1900 11/01/18 2147  BP: 121/71  127/66 (!) 138/52  Pulse: (!) 119 68 64 (!) 58  Resp:  (!) 21 16 16   Temp:    98.2 F (36.8 C)  TempSrc:    Oral  SpO2: 95% 98% 96% 98%  Weight:      Height:        Physical Exam  Constitutional: He appears well-developed and well-nourished. No distress.  Resting comfortably in a stretcher watching television  HENT:  Head: Normocephalic.  Very small amount of dried blood noticed around the lips.  Oropharynx could not be checked as patient refused to open his mouth.  Eyes: Pupils are equal, round, and reactive to light. Right eye exhibits no discharge. Left eye exhibits no discharge.  Neck: Neck supple.  Mild JVD  Cardiovascular: Normal rate, regular rhythm and intact distal pulses.  Pulmonary/Chest: Effort normal. No respiratory distress. He has no wheezes.  Slightly coarse breath sounds appreciated at the bases upon auscultation of  anterior lung fields.  Posterior lung fields could not be auscultated as patient is not able to understand instructions.  Abdominal: Soft. Bowel sounds are normal. He exhibits no distension. There is no abdominal tenderness. There is no guarding.  Musculoskeletal:        General: Edema present. No tenderness or deformity.     Comments: +2 pedal edema bilaterally  Neurological:  Awake and alert, watching television and looking around the room Nonverbal and not following commands Moving all extremities spontaneously  Skin: Skin is warm and dry. He is not diaphoretic.     Labs on Admission: I have personally reviewed following labs and imaging studies  CBC: Recent Labs  Lab 11/01/18 1405  WBC 16.9*  NEUTROABS 15.0*  HGB  14.9  HCT 42.9  MCV 94.3  PLT 016   Basic Metabolic Panel: Recent Labs  Lab 11/01/18 1405  NA 136  K 3.4*  CL 97*  CO2 25  GLUCOSE 129*  BUN 13  CREATININE 1.03  CALCIUM 9.5   GFR: Estimated Creatinine Clearance: 43.7 mL/min (by C-G formula based on SCr of 1.03 mg/dL). Liver Function Tests: Recent Labs  Lab 11/01/18 1624  AST 31  ALT 24  ALKPHOS 64  BILITOT 1.2  PROT 6.6  ALBUMIN 4.1   No results for input(s): LIPASE, AMYLASE in the last 168 hours. Recent Labs  Lab 11/01/18 1624  AMMONIA 22   Coagulation Profile: No results for input(s): INR, PROTIME in the last 168 hours. Cardiac Enzymes: Recent Labs  Lab 11/01/18 1405 11/01/18 1936  CKTOTAL 343  --   TROPONINI  --  0.06*   BNP (last 3 results) No results for input(s): PROBNP in the last 8760 hours. HbA1C: No results for input(s): HGBA1C in the last 72 hours. CBG: No results for input(s): GLUCAP in the last 168 hours. Lipid Profile: No results for input(s): CHOL, HDL, LDLCALC, TRIG, CHOLHDL, LDLDIRECT in the last 72 hours. Thyroid Function Tests: No results for input(s): TSH, T4TOTAL, FREET4, T3FREE, THYROIDAB in the last 72 hours. Anemia Panel: No results for input(s):  VITAMINB12, FOLATE, FERRITIN, TIBC, IRON, RETICCTPCT in the last 72 hours. Urine analysis:    Component Value Date/Time   COLORURINE YELLOW 11/01/2018 1400   APPEARANCEUR CLEAR 11/01/2018 1400   LABSPEC 1.014 11/01/2018 1400   PHURINE 7.0 11/01/2018 1400   GLUCOSEU NEGATIVE 11/01/2018 1400   HGBUR NEGATIVE 11/01/2018 1400   BILIRUBINUR NEGATIVE 11/01/2018 1400   KETONESUR 20 (A) 11/01/2018 1400   PROTEINUR NEGATIVE 11/01/2018 1400   UROBILINOGEN 0.2 08/17/2013 0105   NITRITE NEGATIVE 11/01/2018 1400   LEUKOCYTESUR NEGATIVE 11/01/2018 1400    Radiological Exams on Admission: Dg Chest 2 View  Result Date: 11/01/2018 CLINICAL DATA:  85 year old male with altered mental status EXAM: CHEST - 2 VIEW COMPARISON:  10/07/2016 FINDINGS: Cardiomediastinal silhouette unchanged in size and contour. No evidence of central vascular congestion. No pneumothorax. No pleural effusion. Patchy opacities at the bilateral lung bases. No displaced fracture. Degenerative changes of the spine IMPRESSION: Patchy opacities bilateral lungs, potentially developing infection and/or atelectasis. Electronically Signed   By: Corrie Mckusick D.O.   On: 11/01/2018 14:53   Ct Head Wo Contrast  Result Date: 11/01/2018 CLINICAL DATA:  Altered mental status and decreased responsiveness EXAM: CT HEAD WITHOUT CONTRAST TECHNIQUE: Contiguous axial images were obtained from the base of the skull through the vertex without intravenous contrast. COMPARISON:  Brain MRI July 21, 2017; head CT August 08, 2018 FINDINGS: Brain: Mild diffuse atrophy is stable. There remains edema throughout much of the inferior left temporal lobe. A known meningioma arising in the medial left temporal lobe is better appreciated on prior MR. There is subtle increased attenuation in the area of the known meningioma, stable, measuring 2.2 x 0.9 cm. No similar appearing edema elsewhere. No other mass evident. There is no hemorrhage, extra-axial fluid  collection, or midline shift. There is patchy small vessel disease in the centra semiovale bilaterally. No acute infarct is demonstrable on this study. Vascular: There is no appreciable hyperdense vessel. There is calcification in the distal left vertebral artery as well as in both carotid siphon regions. Skull: The bony calvarium appears intact. Sinuses/Orbits: There is mucosal thickening in several ethmoid air cells bilaterally. Other visualized paranasal sinuses are  clear. Visualized orbits appear symmetric bilaterally. Other: Mastoid air cells are clear. IMPRESSION: Persistent vasogenic edema in the left temporal lobe adjacent to a known meningioma, not felt to be appreciably changed in size or contour. No new edema evident. No hemorrhage. There is underlying atrophy with periventricular small vessel disease. No evident acute infarct. There are foci of arterial vascular calcification. There is mucosal thickening in several ethmoid air cells bilaterally. Electronically Signed   By: Lowella Grip III M.D.   On: 11/01/2018 15:34    EKG: Independently reviewed.  Sinus rhythm with first-degree AV block.  Assessment/Plan Principal Problem:   Multifocal pneumonia Active Problems:   Hypertension   Sepsis ([place])   Acute metabolic encephalopathy   Hypokalemia   Sepsis secondary to multifocal pneumonia Tachycardic and tachypneic on arrival.  Afebrile.  White count 25.0 with neutrophilic predominance.  Lactic acid normal.  COVID-19 rapid test negative. Chest x-ray (personally reviewed) showing patchy opacities in bilateral lungs, concerning for developing infection versus atelectasis.  Received vancomycin and Zosyn in the ED. PSI/ PORT score 141 (risk class V) with 27.0-29.2% mortality risk.  -Continue vancomycin and Zosyn -IV fluid hydration -Supplemental oxygen if needed -Continue to monitor CBC -Blood culture x2  Acute metabolic encephalopathy Suspect secondary to pneumonia.  Currently nonverbal  and not following commands. No obvious focal motor deficit noted, moving all extremities spontaneously.  UA not suggestive of infection.  Ammonia level normal. Head CT showing persistent vasogenic edema in the left temporal lobe adjacent to the known meningioma, not felt to be appreciably changed in size or contour.  No new edema evident.  No hemorrhage.  No evident acute infarct. -Continue to monitor mental status -Management of multifocal pneumonia as mentioned above -Check TSH, free T4, B12 levels -Consider brain MRI if no improvement with treatment for pneumonia.  Fall, ?syncope Patient was found lying between his bed and the floor at his nursing home.  He was down on the floor for an unknown period of time.  It is not clear whether this was a syncopal event.  Troponin 0.06, EKG with first-degree AV block and ?ST depression in lateral leads.  Appears comfortable on exam and no obvious deformities noted.  Head CT negative for acute finding.  CK within normal range.  Fall could possibly be due to physical deconditioning from underlying infection. -Cardiac monitoring -Management of pneumonia as mentioned above -IV fluid hydration -Check orthostatics -PT and OT evaluation -Echocardiogram -Trend troponin -Check TSH, free T4 levels  Mild hypokalemia Potassium 3.4. -Replete potassium.  Check magnesium level.  Continue to monitor BMP.  Chronic diastolic congestive heart failure Has peripheral edema but chest x-ray without pulmonary edema. -Continue gentle IV fluid hydration given sepsis and patient being non-verbal and not eating -Check BNP  Hypertension -Continue amlodipine  Hyperlipidemia -Continue Crestor  DVT prophylaxis: Lovenox Code Status: Full code Family Communication: No family available at this time. Disposition Plan: Anticipate discharge to nursing home after clinical improvement. Consults called: None Admission status: It is my clinical opinion that admission to  INPATIENT is reasonable and necessary in this 85 y.o. male . presenting with symptoms of altered mental status, concerning for acute metabolic encephalopathy secondary to multifocal pneumonia . in the context of PMH including: Nursing home resident, hypertension, hyperlipidemia . with pertinent positives on physical exam including: Nonverbal, not following commands . and pertinent positives on radiographic and laboratory data including: Leukocytosis.  Chest x-ray with evidence of multifocal pneumonia. . Workup and treatment include IV fluid hydration, IV  broad-spectrum antibiotics.  PSI/ PORT score 141 (risk class V) with 27.0-29.2% mortality risk.   Given the aforementioned, the predictability of an adverse outcome is felt to be significant. I expect that the patient will require at least 2 midnights in the hospital to treat this condition.   The medical decision making on this patient was of high complexity and the patient is at high risk for clinical deterioration, therefore this is a level 3 visit.  Shela Leff MD [facility] Pager [phone]  If 7PM-7AM, please contact night-coverage [email or web address] Password [facility]  11/02/2018, 12:26 AM

## 2018-11-01 NOTE — ED Provider Notes (Signed)
Care assumed from Dr. Tyrone Nine.  Patient was awaiting results of other labs prior to admission for multifocal pneumonia.  Patient was found altered and down wedged between the bed and the wall for an unknown amount of time.  Patient was found to have multifocal pneumonia and started on broad-spectrum antibiotics.  Patient has been tachycardic in the emergency department and tachypneic.  He is not hypotensive.  Initial laboratory testing shows CK is not significantly elevated.  Normal kidney function.  Patient has a leukocytosis likely consistent with his pneumonia.  No anemia.  Urinalysis shows some ketones may indicate dehydration but no evidence of infection.  CT head appears unchanged from prior with persistent vasogenic edema for a known meningioma.  No evidence of new stroke.  Chest x-ray showed the patchy opacities in bilateral lungs concerning for developing infection.  Patient still waiting on results of lactic acid and other labs.  When these are returned, he will be admitted.  Patient to be admitted for multifocal pneumonia.  Clinical Impression: 1. Altered mental status   2. Community acquired pneumonia, unspecified laterality     Disposition: Admit  This note was prepared with assistance of Systems analyst. Occasional wrong-word or sound-a-like substitutions may have occurred due to the inherent limitations of voice recognition software.      Tegeler, Gwenyth Allegra, MD 11/02/18 [phone]

## 2018-11-01 NOTE — ED Notes (Signed)
Pt's daughter, Gwinda Passe, contacted and given update on pt's status.

## 2018-11-01 NOTE — ED Provider Notes (Addendum)
[place] EMERGENCY DEPARTMENT Provider Note   CSN: [id number] Arrival date & time: 11/01/18  1348    History   Chief Complaint Chief Complaint  Patient presents with  . Altered Mental Status    HPI [name] [name] is a 85 y.o. male with HTN, BPH, CAD and diastolic HF presented to the ED with altered mental status from his living facility, [facility]. He was found by staff lying between the bed and floor on his left side, non-verbal. He was last seen normal about 25 hours ago. At baseline he is verbal and able to ambulate.     HPI  Past Medical History:  Diagnosis Date  . Allergic rhinitis   . Amnesia   . BPH (benign prostatic hyperplasia)   . Bradycardia   . Colon polyps   . Coronary artery disease    MODERATE  . Dizziness   . Edema of lower extremity   . Erectile dysfunction   . H. pylori infection    Hx of   . Hard of hearing   . Headache(784.0)   . History of colon polyps 2009   Dr. Orr/Colonoscopy -small cecal adenoma  . Hx of colonoscopy 2009   Dr Lajoyce Corners, hemorrhoids & polyps  . Hyperlipidemia   . Hypertension   . Internal hemorrhoids 2009   Dr. Lajoyce Corners Colonoscopy  . Mitral valve regurgitation   . Reflux esophagitis     Patient Active Problem List   Diagnosis Date Noted  . Right leg weakness 11/03/2017  . Bleeding internal hemorrhoids 10/28/2017  . Chronic diastolic heart failure ([place]) 08/17/2013  . Femur fracture, left ([place]) 08/16/2013  . Bradycardia, severe sinus 10/30/2011  . Personal history of colonic adenoma 01/09/2011  . Family history of malignant neoplasm of colon cancer-mother 01/09/2011  . Coronary artery disease   . Hyperlipidemia   . Hypertension   . NWGNFAOZ(308.6)     Past Surgical History:  Procedure Laterality Date  . APPENDECTOMY    . CARDIAC CATHETERIZATION  1997   REVEALED MILD TO MODERATE IRREGULARITIES. HIS LEFT EVNTRICULAR SYSTOLIC FUNCTION REVEALED NORMAL EJECTION FRACTION WITH EF OF 70%  .  CATARACT EXTRACTION, BILATERAL    . COLONOSCOPY  multiple  . fatty tumor removal     benign  . HEMORROIDECTOMY    . HIP ARTHROPLASTY Left 08/17/2013   Procedure: ARTHROPLASTY BIPOLAR HIP;  Surgeon: Gearlean Alf, MD;  Location: [facility];  Service: Orthopedics;  Laterality: Left;  . INGUINAL HERNIA REPAIR Bilateral 2006  . lower back surgery  2006  . NASAL SEPTUM SURGERY    . TONSILLECTOMY    . UPPER GASTROINTESTINAL ENDOSCOPY          Home Medications    Prior to Admission medications   Medication Sig Start Date End Date Taking? Authorizing Provider  acetaminophen (TYLENOL) 500 MG tablet Take 500 mg by mouth as needed.    [provider]  amLODipine (NORVASC) 10 MG tablet Take 1 tablet (10 mg total) by mouth daily. 03/08/18   [name], [name], MD  furosemide (LASIX) 40 MG tablet Take 40 mg by mouth 2 (two) times daily as needed.     [provider]  lisinopril-hydrochlorothiazide (PRINZIDE,ZESTORETIC) 20-25 MG tablet Take 1 tablet by mouth daily.    [provider]  Multiple Vitamin (MULTIVITAMIN) tablet Take 1 tablet by mouth daily.      [provider]  NIACIN PO Take 500 mg by mouth daily. Mon, tues, thurs sat  [provider]  OVER THE COUNTER MEDICATION Take 1 capsule by mouth daily. Vitamin D & Fish Oil in one large capsule    [provider]  rosuvastatin (CRESTOR) 5 MG tablet Take 5 mg by mouth. Takes rarely    [provider]  vitamin B-12 (CYANOCOBALAMIN) 1000 MCG tablet Take 1,000 mcg by mouth daily.    [provider]  zinc gluconate 50 MG tablet Take 100 mg by mouth 3 (three) times a week. Take on Mon wed and fridays    [provider]    Family History Family History  Problem Relation Age of Onset  . Colon cancer Mother 78       second cancer in 84's deceased after emergency surgery  . Breast cancer Sister     Social History Social History   Tobacco Use  . Smoking status: Former  Smoker    Last attempt to quit: 10/25/1975    Years since quitting: 43.0  . Smokeless tobacco: Never Used  Substance Use Topics  . Alcohol use: Yes    Comment: occ 6-7 times a year  . Drug use: No     Allergies   Patient has no known allergies.   Review of Systems Review of Systems  Reason unable to perform ROS: altered mental status.     Physical Exam Updated Vital Signs BP 121/63   Pulse (!) 226   Temp 98 F (36.7 C) (Axillary)   Resp 16   SpO2 (!) 77%   Physical Exam HENT:     Head: Normocephalic and atraumatic.     Mouth/Throat:     Comments: Noted to have dried blood on lower lip/mouth Eyes:     Conjunctiva/sclera: Conjunctivae normal.  Cardiovascular:     Rate and Rhythm: Regular rhythm. Tachycardia present.     Pulses: Normal pulses.     Heart sounds: Normal heart sounds. No murmur. No friction rub. No gallop.   Pulmonary:     Effort: Pulmonary effort is normal. No respiratory distress.  Abdominal:     General: Abdomen is flat. Bowel sounds are normal. There is no distension.     Palpations: Abdomen is soft.     Tenderness: There is no abdominal tenderness. There is no guarding.  Musculoskeletal:        General: No tenderness.     Right lower leg: Edema present.     Left lower leg: Edema present.  Skin:    General: Skin is warm and dry.  Neurological:     Mental Status: He is disoriented.      ED Treatments / Results  Labs (all labs ordered are listed, but only abnormal results are displayed) Labs Reviewed  CBC WITH DIFFERENTIAL/PLATELET - Abnormal; Notable for the following components:      Result Value   WBC 16.9 (*)    Neutro Abs 15.0 (*)    All other components within normal limits  URINALYSIS, ROUTINE W REFLEX MICROSCOPIC - Abnormal; Notable for the following components:   Ketones, ur 20 (*)    All other components within normal limits  CK  BASIC METABOLIC PANEL    EKG None  Radiology Dg Chest 2 View  Result Date: 11/01/2018  CLINICAL DATA:  85 year old male with altered mental status EXAM: CHEST - 2 VIEW COMPARISON:  10/07/2016 FINDINGS: Cardiomediastinal silhouette unchanged in size and contour. No evidence of central vascular congestion. No pneumothorax. No pleural effusion. Patchy opacities at the bilateral lung bases. No displaced fracture. Degenerative  changes of the spine IMPRESSION: Patchy opacities bilateral lungs, potentially developing infection and/or atelectasis. Electronically Signed   By: Corrie Mckusick D.O.   On: 11/01/2018 14:53    Procedures Procedures (including critical care time)  Medications Ordered in ED Medications  vancomycin (VANCOCIN) IVPB 1000 mg/200 mL premix (has no administration in time range)  piperacillin-tazobactam (ZOSYN) IVPB 4.5 g (has no administration in time range)     Initial Impression / Assessment and Plan / ED Course  I have reviewed the triage vital signs and the nursing notes.  Pertinent labs & imaging results that were available during my care of the patient were reviewed by me and considered in my medical decision making (see chart for details).  Pt presented to the ED with altered mental status from his living facility, [facility]. He was found by staff lying between the bed and floor on his left side, non-verbal. He was last seen normal about 25 hours ago. At baseline he is verbal and able to ambulate.   He is not alert or oriented to person place or time. Non-toxic appearing, confused on exam. Noted to have dried blood around his lip/mouth. Will order labs and CT head and reassess.   UA positive for ketones, otherwise unremarkable. Chest xray showed bilatearal patchy opacities, possibly infection and/or atelectasis. Leukocytosis 16.9. Will start on vanc and zoysn. CK, BMP and CT head pending. Will need to be admitted to the hospital. Will sign out to Dr. Sherry Ruffing.     Final Clinical Impressions(s) / ED Diagnoses   Final diagnoses:  Altered mental status   Community acquired pneumonia, unspecified laterality    ED Discharge Orders    None       Raihan Kimmel N, DO 11/01/18 1522    Filomena Pokorney N, DO 11/01/18 1523    Malicia Blasdel N, DO 11/01/18 [place], DO 11/01/18 1526

## 2018-11-01 NOTE — ED Notes (Signed)
Main lab called to say that they cannot run the Troponin lab that was collected and sent at 1620.   New order added.

## 2018-11-02 ENCOUNTER — Inpatient Hospital Stay (HOSPITAL_COMMUNITY): Payer: Medicare HMO

## 2018-11-02 DIAGNOSIS — E876 Hypokalemia: Secondary | ICD-10-CM

## 2018-11-02 DIAGNOSIS — G9341 Metabolic encephalopathy: Secondary | ICD-10-CM

## 2018-11-02 DIAGNOSIS — A419 Sepsis, unspecified organism: Secondary | ICD-10-CM

## 2018-11-02 LAB — CBC
HCT: 36.7 % — ABNORMAL LOW (ref 39.0–52.0)
Hemoglobin: 12.9 g/dL — ABNORMAL LOW (ref 13.0–17.0)
MCH: 32.9 pg (ref 26.0–34.0)
MCHC: 35.1 g/dL (ref 30.0–36.0)
MCV: 93.6 fL (ref 80.0–100.0)
Platelets: 192 10*3/uL (ref 150–400)
RBC: 3.92 MIL/uL — ABNORMAL LOW (ref 4.22–5.81)
RDW: 12.2 % (ref 11.5–15.5)
WBC: 12.8 10*3/uL — ABNORMAL HIGH (ref 4.0–10.5)
nRBC: 0 % (ref 0.0–0.2)

## 2018-11-02 LAB — VITAMIN B12: Vitamin B-12: 1261 pg/mL — ABNORMAL HIGH (ref 180–914)

## 2018-11-02 LAB — BASIC METABOLIC PANEL
Anion gap: 11 (ref 5–15)
BUN: 10 mg/dL (ref 8–23)
CO2: 24 mmol/L (ref 22–32)
Calcium: 8.7 mg/dL — ABNORMAL LOW (ref 8.9–10.3)
Chloride: 99 mmol/L (ref 98–111)
Creatinine, Ser: 0.87 mg/dL (ref 0.61–1.24)
GFR calc Af Amer: >60 mL/min (ref >60)
GFR calc non Af Amer: >60 mL/min (ref >60)
Glucose, Bld: 114 mg/dL — ABNORMAL HIGH (ref 70–99)
Potassium: 3.3 mmol/L — ABNORMAL LOW (ref 3.5–5.1)
Sodium: 134 mmol/L — ABNORMAL LOW (ref 135–145)

## 2018-11-02 LAB — HIV ANTIBODY (ROUTINE TESTING W REFLEX): HIV Screen 4th Generation wRfx: NONREACTIVE

## 2018-11-02 LAB — PROCALCITONIN: Procalcitonin: <0.1 ng/mL

## 2018-11-02 LAB — TROPONIN I
Troponin I: 0.05 ng/mL (ref <0.03)
Troponin I: 0.04 ng/mL (ref <0.03)

## 2018-11-02 LAB — MAGNESIUM: Magnesium: 1.8 mg/dL (ref 1.7–2.4)

## 2018-11-02 LAB — BRAIN NATRIURETIC PEPTIDE: B Natriuretic Peptide: 316.3 pg/mL — ABNORMAL HIGH (ref 0.0–100.0)

## 2018-11-02 LAB — MRSA PCR SCREENING: MRSA by PCR: NEGATIVE

## 2018-11-02 LAB — T4, FREE: Free T4: 0.95 ng/dL (ref 0.82–1.77)

## 2018-11-02 LAB — TSH: TSH: 1.504 u[IU]/mL (ref 0.350–4.500)

## 2018-11-02 MED ORDER — POTASSIUM CHLORIDE CRYS ER 20 MEQ PO TBCR
40.0000 meq | EXTENDED_RELEASE_TABLET | Freq: Two times a day (BID) | ORAL | Status: AC
  Administered 2018-11-02 (×2): 40 meq via ORAL
  Filled 2018-11-02 (×2): qty 2

## 2018-11-02 NOTE — TOC Initial Note (Signed)
Transition of Care [facility]) - Initial/Assessment Note    Patient Details  Name: [name] [name] MRN: [id number] Date of Birth: 1924/09/12  [facility]) CM/SW Contact:    Eileen Stanford, LCSW Phone Number: 11/02/2018, 2:04 PM  Clinical Narrative:   CSW received a voicemail from [place] stating pt is from their Materials [facility]. Pt is only alert to self. CSW spoke with pt's daughter, Gwinda Passe. Pt's daughter confirmed pt is from [place].  PT is recommending SNF. Pt will go back to [place] SNF--confirmed by facility, daughter updated. CSW to continue to follow for d/c.             Expected Discharge Plan: Skilled Nursing Facility Barriers to Discharge: Continued Medical Work up   Patient Goals and CMS Choice Patient states their goals for this hospitalization and ongoing recovery are:: " pt to return to ILF" CMS Medicare.gov Compare Post Acute Care list provided to:: (N/A) Choice offered to / list presented to : NA  Expected Discharge Plan and Services Expected Discharge Plan: [place] In-house Referral: Clinical Social Work Discharge Planning Services: NA Post Acute Care Choice: [place] Living arrangements for the past 2 months: [place]                 DME Arranged: N/A DME Agency: NA       HH Arranged: NA [place] Agency: NA        Prior Living Arrangements/Services Living arrangements for the past 2 months: [place] Lives with:: Self Patient language and need for interpreter reviewed:: Yes Do you feel safe going back to the place where you live?: Yes      Need for Family Participation in Patient Care: No (Comment) Care giver support system in place?: Yes (comment)(daughters)   Criminal Activity/Legal Involvement Pertinent to Current Situation/Hospitalization: No - Comment as needed  Activities of Daily Living      Permission  Sought/Granted Permission sought to share information with : Facility Sport and exercise psychologist, Family Supports    Share Information with NAME: Gwinda Passe  Permission granted to share info w AGENCY: [place] granted to share info w Relationship: Daughter     Emotional Assessment Appearance:: Appears stated age Attitude/Demeanor/Rapport: Unable to Assess Affect (typically observed): Unable to Assess Orientation: : Oriented to Self Alcohol / Substance Use: Not Applicable Psych Involvement: No (comment)  Admission diagnosis:  Altered mental status [R41.82] Community acquired pneumonia, unspecified laterality [J18.9] Patient Active Problem List   Diagnosis Date Noted  . Sepsis ([place]) 11/02/2018  . Acute metabolic encephalopathy 66/29/4765  . Hypokalemia 11/02/2018  . Multifocal pneumonia 11/01/2018  . Right leg weakness 11/03/2017  . Bleeding internal hemorrhoids 10/28/2017  . Chronic diastolic heart failure ([place]) 08/17/2013  . Femur fracture, left ([place]) 08/16/2013  . Bradycardia, severe sinus 10/30/2011  . Personal history of colonic adenoma 01/09/2011  . Family history of malignant neoplasm of colon cancer-mother 01/09/2011  . Coronary artery disease   . Hyperlipidemia   . Hypertension   . YYTKPTWS(568.1)    PCP:  Deland Pretty, MD Pharmacy:   [place], [place] [address] Phone: [phone] Fax: [phone]     Social Determinants of Health (SDOH) Interventions    Readmission Risk Interventions No flowsheet data found.

## 2018-11-02 NOTE — Progress Notes (Addendum)
Occupational Therapy Evaluation Patient Details Name: [name] [name] MRN: [id number] DOB: 29-Nov-1924 Today's Date: 11/02/2018    History of Present Illness [name] [name] is a 85 y.o. male with medical history significant of amnesia, BPH, bradycardia, coronary artery disease, colon polyps, hypertension, hyperlipidemia, mitral valve regurgitation, diastolic congestive heart failure, reflux esophagitis, and conditions listed below presenting to the hospital for evaluation of altered mental status.  He was found by his nursing home staff at 1315 lying in between the bed and floor on his left side.  He was last seen normal about 25 hours prior to ED arrival.  Found to be nonverbal at triage but at baseline is verbal and able to ambulate.   Clinical Impression   Information regarding pt's PLOF PTA, found in chart, unable to contact family this session. PTA, pt was living at [facility] and was modified independent with ambulation at RW level. Pt currently requires minA for ADL/IADL and minA for functional mobility at RW level. Pt was orthostatic standing with no reports of dizziness/lightheadedness, nsg aware. Sitting BP 144/57, 56min standing BP 159/59, 76min Standing BP 139/115. Pt continues to demonstrate cognitive deficits, listed below (see OT problem list) impacting his ability to safely and independently complete ADL/IADL and functional mobility. Due to decline in current level of function, pt would benefit from acute OT to address established goals to facilitate safe D/C to venue listed below. At this time, recommend SNF follow-up. Will continue to follow acutely.     Follow Up Recommendations  SNF;Supervision/Assistance - 24 hour    Equipment Recommendations  3 in 1 bedside commode    Recommendations for Other Services PT consult     Precautions / Restrictions Precautions Precautions: Fall, orthostatics Restrictions Weight Bearing Restrictions: No      Mobility Bed  Mobility Overal bed mobility: Needs Assistance Bed Mobility: Supine to Sit     Supine to sit: Min guard     General bed mobility comments: min guard for safety due to impulsivity and drop in BP  Transfers Overall transfer level: Needs assistance Equipment used: Rolling walker (2 wheeled) Transfers: Sit to/from Stand Sit to Stand: Min assist         General transfer comment: minA for stability in standing;required multimodal cues for proper use of RW    Balance Overall balance assessment: Needs assistance Sitting-balance support: No upper extremity supported;Feet unsupported Sitting balance-Leahy Scale: Good     Standing balance support: Single extremity supported;During functional activity Standing balance-Leahy Scale: Fair Standing balance comment: single UE support required for static standing;BUE required for dynamic standing                           ADL either performed or assessed with clinical judgement   ADL Overall ADL's : Needs assistance/impaired Eating/Feeding: Set up;Sitting   Grooming: Minimal assistance;Standing;Moderate assistance   Upper Body Bathing: Min guard;Sitting   Lower Body Bathing: Min guard;Sit to/from stand   Upper Body Dressing : Min guard;Sitting   Lower Body Dressing: Minimal assistance;Sit to/from stand Lower Body Dressing Details (indicate cue type and reason): pt can figure-4 to don doff socks;mild instability while sitting EOB Toilet Transfer: Minimal assistance;Ambulation Toilet Transfer Details (indicate cue type and reason): simulated sit<>stand from EOB ambulating to recliner with RW Toileting- Clothing Manipulation and Hygiene: Minimal assistance;Moderate assistance       Functional mobility during ADLs: Minimal assistance General ADL Comments: pt requires multimodal cues for task completion due  to drop in BP, impulsivity and instability with more dyamic level activities     Vision Baseline Vision/History:  Wears glasses Patient Visual Report: No change from baseline       Perception     Praxis      Pertinent Vitals/Pain Pain Assessment: No/denies pain     Hand Dominance Right   Extremity/Trunk Assessment Upper Extremity Assessment Upper Extremity Assessment: Overall WFL for tasks assessed   Lower Extremity Assessment Lower Extremity Assessment: Defer to PT evaluation       Communication Communication Communication: HOH   Cognition Arousal/Alertness: Awake/alert Behavior During Therapy: WFL for tasks assessed/performed Overall Cognitive Status: Impaired/Different from baseline Area of Impairment: Orientation;Attention;Memory;Following commands;Safety/judgement;Awareness;Problem solving                 Orientation Level: Disoriented to;Place;Time;Situation Current Attention Level: Focused Memory: Decreased short-term memory Following Commands: Follows one step commands with increased time;Follows one step commands consistently Safety/Judgement: Decreased awareness of safety;Decreased awareness of deficits Awareness: Intellectual Problem Solving: Slow processing;Requires verbal cues General Comments: pt impulsive, required vc to increase safety awareness;pt reported he was in [place], despite educating pt he was in [employer] he did not use RW pta, per chart, family reports he was using RW   General Comments       Exercises     Shoulder Instructions      Home Living Family/patient expects to be discharged to:: Skilled nursing facility                                 Additional Comments: pt reports he lives alone and has family in [place]      Prior Functioning/Environment Level of Independence: Needs assistance  Gait / Transfers Assistance Needed: per chart, pt was ambulating with use of RW ADL's / Homemaking Assistance Needed: unsure pt's prior level of functioning   Comments: unable to get in touch with family this session to  determine pt's prior level of functioning, pt unreliable historian         OT Problem List: Decreased activity tolerance;Impaired balance (sitting and/or standing);Decreased cognition;Decreased safety awareness;Decreased knowledge of use of DME or AE;Decreased knowledge of precautions;Pain      OT Treatment/Interventions: Self-care/ADL training;Therapeutic exercise;Energy conservation;Therapeutic activities;DME and/or AE instruction;Cognitive remediation/compensation;Patient/family education;Balance training    OT Goals(Current goals can be found in the care plan section) Acute Rehab OT Goals Patient Stated Goal: to see his grandkids  OT Goal Formulation: With patient Time For Goal Achievement: 11/16/18 Potential to Achieve Goals: Good ADL Goals Pt Will Perform Grooming: with supervision Pt Will Perform Upper Body Dressing: with supervision Pt Will Perform Lower Body Dressing: with supervision Pt Will Transfer to Toilet: with supervision Additional ADL Goal #1: Pt will follow multi-step commands consistently throughout session.  OT Frequency: Min 2X/week   Barriers to D/C: Decreased caregiver support  unsure how much support pt will have at home       Co-evaluation              AM-PAC OT "6 Clicks" Daily Activity     Outcome Measure Help from another person eating meals?: None Help from another person taking care of personal grooming?: A Little Help from another person toileting, which includes using toliet, bedpan, or urinal?: A Little Help from another person bathing (including washing, rinsing, drying)?: A Little Help from another person to put on and taking off regular upper body clothing?: A Little Help from  another person to put on and taking off regular lower body clothing?: A Little 6 Click Score: 19   End of Session Equipment Utilized During Treatment: Gait belt;Rolling walker Nurse Communication: Mobility status  Activity Tolerance: Patient tolerated  treatment well Patient left: in chair;with call bell/phone within reach;with nursing/sitter in room;with chair alarm set  OT Visit Diagnosis: Unsteadiness on feet (R26.81);Other abnormalities of gait and mobility (R26.89);Muscle weakness (generalized) (M62.81);Other symptoms and signs involving cognitive function                Time: 4627-0350 OT Time Calculation (min): 20 min Charges:  OT General Charges $OT Visit: 1 Visit OT Evaluation $OT Eval Moderate Complexity: [place] OTR/L Acute Rehabilitation Services Office: [place] 11/02/2018, 12:49 PM

## 2018-11-02 NOTE — Evaluation (Signed)
Physical Therapy Evaluation Patient Details Name: [name] [name] MRN: [id number] DOB: 1924-07-18 Today's Date: 11/02/2018   History of Present Illness  [name] [name] is a 85 y.o. male with medical history significant of amnesia, BPH, bradycardia, coronary artery disease, colon polyps, hypertension, hyperlipidemia, mitral valve regurgitation, diastolic congestive heart failure, reflux esophagitis, and conditions listed below presenting to the hospital for evaluation of altered mental status.  He was found by his nursing home staff at 1315 lying in between the bed and floor on his left side.  He was last seen normal about 25 hours prior to ED arrival.  Found to be nonverbal at triage but at baseline is verbal and able to ambulate.  Clinical Impression  Pt admitted with above. Pt with decreased insight to deficits and safety, increased falls risk, generalized weakness and impaired balance. Pt states he resides at [facility] and does everything on his own, unsure the accuracy of this report. Pt will need 24/7 assist upon d/c. Acute PT to cont to follow.    Follow Up Recommendations SNF;Supervision/Assistance - 24 hour    Equipment Recommendations  None recommended by PT(Patient states he has a RW)    Recommendations for Other Services       Precautions / Restrictions Precautions Precautions: Fall Restrictions Weight Bearing Restrictions: No      Mobility  Bed Mobility Overal bed mobility: Needs Assistance Bed Mobility: Supine to Sit     Supine to sit: Min guard     General bed mobility comments: pt sitting up in chair upon PT arrival  Transfers Overall transfer level: Needs assistance Equipment used: Rolling walker (2 wheeled) Transfers: Sit to/from Stand Sit to Stand: Min assist         General transfer comment: v/c's for safety and hand placement, minA to steady during transition of hands  Ambulation/Gait Ambulation/Gait assistance: Min assist Gait  Distance (Feet): 120 Feet Assistive device: Rolling walker (2 wheeled) Gait Pattern/deviations: Step-through pattern;Decreased stride length;Trunk flexed Gait velocity: slow Gait velocity interpretation: 1.31 - 2.62 ft/sec, indicative of limited community ambulator General Gait Details: pt with short shuffled steps and flexed trunk despite max verbal cues to stay in walker and stand upright. minA for walker management during turning  Stairs            Wheelchair Mobility    Modified Rankin (Stroke Patients Only) Modified Rankin (Stroke Patients Only) Pre-Morbid Rankin Score: Slight disability Modified Rankin: Moderate disability     Balance Overall balance assessment: Needs assistance Sitting-balance support: No upper extremity supported;Feet unsupported Sitting balance-Leahy Scale: Good     Standing balance support: Bilateral upper extremity supported Standing balance-Leahy Scale: Fair Standing balance comment: dependent on RW                             Pertinent Vitals/Pain Pain Assessment: No/denies pain    Home Living Family/patient expects to be discharged to:: Skilled nursing facility                 Additional Comments: pt reports he lives a [facility], pt extremely poor historian, unsure of PLOF    Prior Function Level of Independence: Needs assistance   Gait / Transfers Assistance Needed: per chart, pt was ambulating with use of RW  ADL's / Homemaking Assistance Needed: unsure pt's prior level of functioning  Comments: unable to get in touch with family this session to determine pt's prior level of functioning,  pt unreliable historian      Hand Dominance   Dominant Hand: Right    Extremity/Trunk Assessment   Upper Extremity Assessment Upper Extremity Assessment: Defer to OT evaluation    Lower Extremity Assessment Lower Extremity Assessment: Generalized weakness    Cervical / Trunk Assessment Cervical / Trunk  Assessment: Kyphotic  Communication   Communication: HOH(EXTREMELY)  Cognition Arousal/Alertness: Awake/alert Behavior During Therapy: WFL for tasks assessed/performed Overall Cognitive Status: Impaired/Different from baseline Area of Impairment: Orientation;Attention;Memory;Following commands;Safety/judgement;Awareness;Problem solving                 Orientation Level: Disoriented to;Place;Time;Situation Current Attention Level: Focused Memory: Decreased short-term memory Following Commands: Follows one step commands with increased time;Follows one step commands consistently Safety/Judgement: Decreased awareness of safety;Decreased awareness of deficits Awareness: Intellectual Problem Solving: Slow processing;Requires verbal cues General Comments: pt impulsive, decresaed safety awareness, extremely HOH limiting command follow ability, pt confused and inconsistent with PLOF report/recall      General Comments General comments (skin integrity, edema, etc.): pt assist to bathroom for BM, pt supervision for safety for hygiene s/p BM, pt with catheter    Exercises     Assessment/Plan    PT Assessment Patient needs continued PT services  PT Problem List Decreased range of motion;Decreased strength;Decreased activity tolerance;Decreased balance;Decreased mobility;Decreased coordination;Decreased cognition;Decreased knowledge of use of DME;Decreased safety awareness       PT Treatment Interventions DME instruction;Gait training;Stair training;Therapeutic activities;Functional mobility training;Therapeutic exercise;Balance training;Neuromuscular re-education;Cognitive remediation    PT Goals (Current goals can be found in the Care Plan section)  Acute Rehab PT Goals Patient Stated Goal: didn't state PT Goal Formulation: With patient Time For Goal Achievement: 11/16/18 Potential to Achieve Goals: Good    Frequency Min 3X/week   Barriers to discharge        Co-evaluation                AM-PAC PT "6 Clicks" Mobility  Outcome Measure Help needed turning from your back to your side while in a flat bed without using bedrails?: A Little Help needed moving from lying on your back to sitting on the side of a flat bed without using bedrails?: A Little Help needed moving to and from a bed to a chair (including a wheelchair)?: A Little Help needed standing up from a chair using your arms (e.g., wheelchair or bedside chair)?: A Little Help needed to walk in hospital room?: A Little Help needed climbing 3-5 steps with a railing? : A Lot 6 Click Score: 17    End of Session Equipment Utilized During Treatment: Gait belt Activity Tolerance: Patient tolerated treatment well Patient left: in chair;with call bell/phone within reach;with chair alarm set Nurse Communication: Mobility status PT Visit Diagnosis: Unsteadiness on feet (R26.81);Other abnormalities of gait and mobility (R26.89);Repeated falls (R29.6);Muscle weakness (generalized) (M62.81);History of falling (Z91.81);Ataxic gait (R26.0);Difficulty in walking, not elsewhere classified (R26.2);Apraxia (R48.2)    Time: 0109-3235 PT Time Calculation (min) (ACUTE ONLY): 26 min   Charges:   PT Evaluation $PT Eval Moderate Complexity: 1 Mod PT Treatments $Gait Training: 8-22 mins        Kittie Plater, PT, DPT Acute Rehabilitation Services Pager #: [phone] Office #: [phone]   Berline Lopes 11/02/2018, 1:44 PM

## 2018-11-02 NOTE — TOC Initial Note (Addendum)
Transition of Care [facility]) - Initial/Assessment Note    Patient Details  Name: [name] [name] MRN: [id number] Date of Birth: 26-Sep-1924  Transition of [facility]) CM/SW Contact:    Sharin Mons, RN Phone Number: 11/02/2018, 10:32 AM  Clinical Narrative:     Pt presents with AMS, multifocal PNA.  From [facility].           NCM spoke to [facility], daughter Therisa Doyne regarding d/c planning. Betsey seems to think dad would benefit from ALF vs SNF. PTA per daughter dad used walker with ambulation.   PT evaluation pending ....Marland KitchenNCM to f/u with daughter.   Sharyl Nimrod (Daughter) Rudransh Bellanca (Daughter)    [phone] (c) Work mobile [phone]       Expected Discharge Plan: Skilled Nursing Facility Barriers to Discharge: Continued Medical Work up   Patient Goals and CMS Choice        Expected Discharge Plan and Services Expected Discharge Plan: [place] In-house Referral: Clinical Social Work Discharge Planning Services: CM Consult   Living arrangements for the past 2 months: [place]                     Prior Living Arrangements/Services Living arrangements for the past 2 months: [place] Lives with:: Self          Need for Family Participation in Patient Care: Yes (Comment) Care giver support system in place?: Yes (comment)   Criminal Activity/Legal Involvement Pertinent to Current Situation/Hospitalization: No - Comment as needed  Activities of Daily Living      Permission Sought/Granted                  Emotional Assessment Appearance:: Appears stated age       Alcohol / Substance Use: Not Applicable Psych Involvement: No (comment)  Admission diagnosis:  Altered mental status [R41.82] Community acquired pneumonia, unspecified laterality [J18.9] Patient Active Problem List   Diagnosis Date Noted  . Sepsis ([place]) 11/02/2018  . Acute metabolic encephalopathy  03/54/6568  . Hypokalemia 11/02/2018  . Multifocal pneumonia 11/01/2018  . Right leg weakness 11/03/2017  . Bleeding internal hemorrhoids 10/28/2017  . Chronic diastolic heart failure ([place]) 08/17/2013  . Femur fracture, left ([place]) 08/16/2013  . Bradycardia, severe sinus 10/30/2011  . Personal history of colonic adenoma 01/09/2011  . Family history of malignant neoplasm of colon cancer-mother 01/09/2011  . Coronary artery disease   . Hyperlipidemia   . Hypertension   . LEXNTZGY(174.9)    PCP:  Deland Pretty, MD Pharmacy:   [place], [place] [address] Phone: [phone] Fax: [phone]     Social Determinants of Health (SDOH) Interventions    Readmission Risk Interventions No flowsheet data found.

## 2018-11-02 NOTE — NC FL2 (Signed)
[place] LEVEL OF CARE SCREENING TOOL     IDENTIFICATION  Patient Name: [name] [name] Birthdate: [birth year] Sex: male Admission Date (Current Location): 11/01/2018  [facility] and [place] Number:  Herbalist and Address:  [facility] [place], [place] [address], [place], [place] [address]      Provider Number: [id number]  Attending Physician Name and Address:  No att. providers found  Relative Name and Phone Number:       Current Level of Care: Hospital Recommended Level of Care: [place] Prior Approval Number:    Date Approved/Denied:   PASRR Number: [id number]    Discharge Plan: SNF    Current Diagnoses: Patient Active Problem List   Diagnosis Date Noted  . Sepsis ([place]) 11/02/2018  . Acute metabolic encephalopathy 56/21/3086  . Hypokalemia 11/02/2018  . Multifocal pneumonia 11/01/2018  . Right leg weakness 11/03/2017  . Bleeding internal hemorrhoids 10/28/2017  . Chronic diastolic heart failure ([place]) 08/17/2013  . Femur fracture, left ([place]) 08/16/2013  . Bradycardia, severe sinus 10/30/2011  . Personal history of colonic adenoma 01/09/2011  . Family history of malignant neoplasm of colon cancer-mother 01/09/2011  . Coronary artery disease   . Hyperlipidemia   . Hypertension   . Headache(784.0)     Orientation RESPIRATION BLADDER Height & Weight     Self  Normal External catheter, Incontinent(placed 10/31/18) Weight: 167 lb 8.8 oz (76 kg) Height:  5\' 7"  (170.2 cm)  BEHAVIORAL SYMPTOMS/MOOD NEUROLOGICAL BOWEL NUTRITION STATUS      Incontinent Diet(heart healthy, thin liquids)  AMBULATORY STATUS COMMUNICATION OF NEEDS Skin   Limited Assist Verbally Normal                       Personal Care Assistance Level of Assistance  Bathing, Dressing, Feeding Bathing Assistance: Limited assistance Feeding assistance: Independent Dressing Assistance: Limited assistance     Functional Limitations Info   Sight, Hearing, Speech Sight Info: Adequate Hearing Info: Adequate Speech Info: Adequate    SPECIAL CARE FACTORS FREQUENCY  PT (By licensed PT), OT (By licensed OT)     PT Frequency: 5x OT Frequency: 5x            Contractures Contractures Info: Not present    Additional Factors Info  Code Status, Allergies Code Status Info: Full Code Allergies Info: No known allergies           Current Medications (11/02/2018):  This is the current hospital active medication list Current Facility-Administered Medications  Medication Dose Route Frequency Provider Last Rate Last Dose  . acetaminophen (TYLENOL) tablet 650 mg  650 mg Oral Q6H PRN Shela Leff, MD      . amLODipine (NORVASC) tablet 10 mg  10 mg Oral Daily Shela Leff, MD   10 mg at 11/02/18 1022  . enoxaparin (LOVENOX) injection 40 mg  40 mg Subcutaneous Q24H Shela Leff, MD   40 mg at 11/01/18 2241  . piperacillin-tazobactam (ZOSYN) IVPB 3.375 g  3.375 g Intravenous Quay Burow, MD 12.5 mL/hr at 11/02/18 0607 3.375 g at 11/02/18 0607  . potassium chloride SA (K-DUR) CR tablet 40 mEq  40 mEq Oral BID Manuella Ghazi, Pratik D, DO   40 mEq at 11/02/18 1022  . rosuvastatin (CRESTOR) tablet 5 mg  5 mg Oral Daily Shela Leff, MD   5 mg at 11/02/18 1022     Discharge Medications: Please see discharge summary for a list of discharge medications.  Relevant  Imaging Results:  Relevant Lab Results:   Additional Information SSN: [id number]  Eileen Stanford, LCSW

## 2018-11-02 NOTE — Progress Notes (Signed)
Pt is confused and keeps pulling off Telemetry equipment.  Replaced and explained importance of equipment 3 times.  Pt. Pulled off again.  Told telemetry to keep him on standby for now.

## 2018-11-02 NOTE — Progress Notes (Signed)
Patient arrived to the unit via bed from the emergency department. Patient is alert and oriented to self.  Skin assessment complete.  Bruise noted to the left hip and right lower leg.  IV intact to the left forearm.  Placed the patient on telemetry. Ordered a low bed for patient.  Placed the patient call bed within reach, lowered the bed and activated the bed alarm.  Will continue to monitor the patient and notify as needed

## 2018-11-02 NOTE — Progress Notes (Signed)
  Echocardiogram 2D Echocardiogram has been attempted. Patient eating dinner.  Randa Lynn Frimy Uffelman 11/02/2018, 4:28 PM

## 2018-11-02 NOTE — Progress Notes (Signed)
PROGRESS NOTE    [name] [name]  JJO:[id number] DOB: 12/09/1924 DOA: 11/01/2018 PCP: Deland Pretty, MD   Brief Narrative:  Per HPI: [name] [name] is a 85 y.o. male with medical history significant of amnesia, BPH, bradycardia, coronary artery disease, colon polyps, hypertension, hyperlipidemia, mitral valve regurgitation, diastolic congestive heart failure, reflux esophagitis, and conditions listed below presenting to the hospital for evaluation of altered mental status.  He was found by his nursing home staff at 1315 lying in between the bed and floor on his left side.  He was last seen normal about 25 hours prior to ED arrival.  Found to be nonverbal at triage but at baseline is verbal and able to ambulate.  Patient was admitted with sepsis secondary to multifocal pneumonia as well as associated acute metabolic encephalopathy.  He was found on the floor of skilled nursing facility and further evaluation with echocardiogram as well as PT and OT are pending.  Assessment & Plan:   Principal Problem:   Multifocal pneumonia Active Problems:   Hypertension   Sepsis ([place])   Acute metabolic encephalopathy   Hypokalemia  Sepsis secondary to multifocal pneumonia Tachycardic and tachypneic on arrival.  Afebrile.  White count 16.0 with neutrophilic predominance.  Lactic acid normal.  COVID-19 rapid test negative. Chest x-ray (personally reviewed) showing patchy opacities in bilateral lungs, concerning for developing infection versus atelectasis.  Received vancomycin and Zosyn in the ED. PSI/ PORT score 141 (risk class V) with 27.0-29.2% mortality risk.  -Continue Zosyn and DC Vanc on 5/19 since MRSA negative -IV fluid hydration DC at this time and follow lactic in am along with procalcitonin -Supplemental oxygen if needed -Continue to monitor CBC -Blood culture x2 with no growth thus far  Acute metabolic encephalopathy-improving Suspect secondary to pneumonia.  Currently nonverbal  and not following commands. No obvious focal motor deficit noted, moving all extremities spontaneously.  UA not suggestive of infection.  Ammonia level normal. Head CT showing persistent vasogenic edema in the left temporal lobe adjacent to the known meningioma, not felt to be appreciably changed in size or contour.  No new edema evident.  No hemorrhage.  No evident acute infarct. -Continue to monitor mental status -Management of multifocal pneumonia as mentioned above -Continue to monitor for now  Fall,  Questionable syncope Patient was found lying between his bed and the floor at his nursing home.  He was down on the floor for an unknown period of time.  It is not clear whether this was a syncopal event.  Troponin 0.06, EKG with first-degree AV block and ?ST depression in lateral leads.  Appears comfortable on exam and no obvious deformities noted.  Head CT negative for acute finding.  CK within normal range.  Fall could possibly be due to physical deconditioning from underlying infection. -Cardiac monitoring -Management of pneumonia as mentioned above -IV fluid hydration DC today -Check orthostatics WNL -PT and OT evaluation -Echocardiogram -Trend troponin; currently flat trend with no CP -Check TSH, free T4 levels WNL  Mild hypokalemia Potassium 3.4. -Replete potassium ongoing orally -Recheck in AM -Mg normal  Chronic diastolic congestive heart failure Has peripheral edema but chest x-ray without pulmonary edema. -Stop IVF and BNP noted to be 316 with no HF symptoms  Hypertension-stable -Continue amlodipine  Hyperlipidemia -Continue Crestor   DVT prophylaxis: Lovenox Code Status: Full Family Communication: None currently at bedside Disposition Plan: Continue on antibiotic treatment for pneumonia and anticipate discharge back to SNF once stable and improved hopefully in 24-48  hours.   Consultants:   None  Procedures:   None  Antimicrobials:  Anti-infectives  (From admission, onward)   Start     Dose/Rate Route Frequency Ordered Stop   11/02/18 1600  vancomycin (VANCOCIN) IVPB 1000 mg/200 mL premix     1,000 mg 200 mL/hr over 60 Minutes Intravenous Every 24 hours 11/01/18 2120     11/01/18 2300  piperacillin-tazobactam (ZOSYN) IVPB 3.375 g     3.375 g 12.5 mL/hr over 240 Minutes Intravenous Every 8 hours 11/01/18 2120     11/01/18 1530  vancomycin (VANCOCIN) IVPB 1000 mg/200 mL premix  Status:  Discontinued     1,000 mg 200 mL/hr over 60 Minutes Intravenous  Once 11/01/18 1524 11/01/18 1529   11/01/18 1530  piperacillin-tazobactam (ZOSYN) IVPB 4.5 g  Status:  Discontinued     4.5 g 200 mL/hr over 30 Minutes Intravenous  Once 11/01/18 1524 11/01/18 1529   11/01/18 1530  vancomycin (VANCOCIN) 1,500 mg in sodium chloride 0.9 % 500 mL IVPB     1,500 mg 250 mL/hr over 120 Minutes Intravenous  Once 11/01/18 1529 11/01/18 1749   11/01/18 1530  piperacillin-tazobactam (ZOSYN) IVPB 3.375 g     3.375 g 100 mL/hr over 30 Minutes Intravenous  Once 11/01/18 1529 11/01/18 1605         Subjective: Patient seen and evaluated today with no new acute complaints or concerns. No acute concerns or events noted overnight. He denies any chest pain or dyspnea, but has had a mild non-productive cough.  Objective: Vitals:   11/01/18 1900 11/01/18 2147 11/02/18 0415 11/02/18 0708  BP: 127/66 (!) 138/52 (!) 118/56 (!) 125/54  Pulse: 64 (!) 58 79 (!) 59  Resp: 16 16    Temp:  98.2 F (36.8 C) 98.3 F (36.8 C) 97.7 F (36.5 C)  TempSrc:  Oral Oral Oral  SpO2: 96% 98% (!) 83% 98%  Weight:      Height:        Intake/Output Summary (Last 24 hours) at 11/02/2018 0911 Last data filed at 11/02/2018 0612 Gross per 24 hour  Intake 500 ml  Output 450 ml  Net 50 ml   Filed Weights   11/01/18 1500  Weight: 76 kg    Examination:  General exam: Appears calm and comfortable  Respiratory system: Clear to auscultation. Respiratory effort  normal. Cardiovascular system: S1 & S2 heard, RRR. No JVD, murmurs, rubs, gallops or clicks. No pedal edema. Gastrointestinal system: Abdomen is nondistended, soft and nontender. No organomegaly or masses felt. Normal bowel sounds heard. Central nervous system: Alert and oriented. No focal neurological deficits. Extremities: Symmetric 5 x 5 power. Skin: No rashes, lesions or ulcers Psychiatry: Judgement and insight appear normal. Mood & affect appropriate.     Data Reviewed: I have personally reviewed following labs and imaging studies  CBC: Recent Labs  Lab 11/01/18 1405 11/02/18 0222  WBC 16.9* 12.8*  NEUTROABS 15.0*  --   HGB 14.9 12.9*  HCT 42.9 36.7*  MCV 94.3 93.6  PLT 223 952   Basic Metabolic Panel: Recent Labs  Lab 11/01/18 1405 11/01/18 2348 11/02/18 0222  NA 136  --  134*  K 3.4*  --  3.3*  CL 97*  --  99  CO2 25  --  24  GLUCOSE 129*  --  114*  BUN 13  --  10  CREATININE 1.03  --  0.87  CALCIUM 9.5  --  8.7*  MG  --  1.8  --  GFR: Estimated Creatinine Clearance: 51.7 mL/min (by C-G formula based on SCr of 0.87 mg/dL). Liver Function Tests: Recent Labs  Lab 11/01/18 1624  AST 31  ALT 24  ALKPHOS 64  BILITOT 1.2  PROT 6.6  ALBUMIN 4.1   No results for input(s): LIPASE, AMYLASE in the last 168 hours. Recent Labs  Lab 11/01/18 1624  AMMONIA 22   Coagulation Profile: No results for input(s): INR, PROTIME in the last 168 hours. Cardiac Enzymes: Recent Labs  Lab 11/01/18 1405 11/01/18 1936 11/02/18 0222  CKTOTAL 343  --   --   TROPONINI  --  0.06* 0.05*   BNP (last 3 results) No results for input(s): PROBNP in the last 8760 hours. HbA1C: No results for input(s): HGBA1C in the last 72 hours. CBG: No results for input(s): GLUCAP in the last 168 hours. Lipid Profile: No results for input(s): CHOL, HDL, LDLCALC, TRIG, CHOLHDL, LDLDIRECT in the last 72 hours. Thyroid Function Tests: Recent Labs    11/01/18 2348  TSH 1.504  FREET4  0.95   Anemia Panel: Recent Labs    11/01/18 2348  VITAMINB12 1,261*   Sepsis Labs: Recent Labs  Lab 11/01/18 1624  LATICACIDVEN 1.8    Recent Results (from the past 240 hour(s))  SARS Coronavirus 2 (CEPHEID- Performed in [place] hospital lab), Hosp Order     Status: None   Collection Time: 11/01/18  4:49 PM  Result Value Ref Range Status   SARS Coronavirus 2 NEGATIVE NEGATIVE Final    Comment: (NOTE) If result is NEGATIVE SARS-CoV-2 target nucleic acids are NOT DETECTED. The SARS-CoV-2 RNA is generally detectable in upper and lower  respiratory specimens during the acute phase of infection. The lowest  concentration of SARS-CoV-2 viral copies this assay can detect is 250  copies / mL. A negative result does not preclude SARS-CoV-2 infection  and should not be used as the sole basis for treatment or other  patient management decisions.  A negative result may occur with  improper specimen collection / handling, submission of specimen other  than nasopharyngeal swab, presence of viral mutation(s) within the  areas targeted by this assay, and inadequate number of viral copies  (<250 copies / mL). A negative result must be combined with clinical  observations, patient history, and epidemiological information. If result is POSITIVE SARS-CoV-2 target nucleic acids are DETECTED. The SARS-CoV-2 RNA is generally detectable in upper and lower  respiratory specimens dur ing the acute phase of infection.  Positive  results are indicative of active infection with SARS-CoV-2.  Clinical  correlation with patient history and other diagnostic information is  necessary to determine patient infection status.  Positive results do  not rule out bacterial infection or co-infection with other viruses. If result is PRESUMPTIVE POSTIVE SARS-CoV-2 nucleic acids MAY BE PRESENT.   A presumptive positive result was obtained on the submitted specimen  and confirmed on repeat testing.  While 2019  novel coronavirus  (SARS-CoV-2) nucleic acids may be present in the submitted sample  additional confirmatory testing may be necessary for epidemiological  and / or clinical management purposes  to differentiate between  SARS-CoV-2 and other Sarbecovirus currently known to infect humans.  If clinically indicated additional testing with an alternate test  methodology [phone]) is advised. The SARS-CoV-2 RNA is generally  detectable in upper and lower respiratory sp ecimens during the acute  phase of infection. The expected result is Negative. Fact Sheet for Patients:  StrictlyIdeas.no Fact Sheet for Healthcare Providers: BankingDealers.co.za This  test is not yet approved or cleared by the [place] and has been authorized for detection and/or diagnosis of SARS-CoV-2 by FDA under an Emergency Use Authorization (EUA).  This EUA will remain in effect (meaning this test can be used) for the duration of the COVID-19 declaration under Section 564(b)(1) of the Act, 21 U.S.C. section 360bbb-3(b)(1), unless the authorization is terminated or revoked sooner. Performed at [facility], [place] [address] [place], [place] [address]   Blood culture (routine x 2)     Status: None (Preliminary result)   Collection Time: 11/01/18  4:57 PM  Result Value Ref Range Status   Specimen Description BLOOD LEFT ANTECUBITAL  Final   Special Requests   Final    BOTTLES DRAWN AEROBIC AND ANAEROBIC Blood Culture adequate volume   Culture   Final    NO GROWTH < 12 HOURS Performed at [facility], [place] [address], [place], [place] [address]    Report Status PENDING  Incomplete  Blood culture (routine x 2)     Status: None (Preliminary result)   Collection Time: 11/01/18  4:57 PM  Result Value Ref Range Status   Specimen Description BLOOD RIGHT ANTECUBITAL  Final   Special Requests   Final    BOTTLES DRAWN AEROBIC AND ANAEROBIC Blood Culture  results may not be optimal due to an inadequate volume of blood received in culture bottles   Culture   Final    NO GROWTH < 12 HOURS Performed at [facility], [place] [address], [place], [place] [address]    Report Status PENDING  Incomplete  MRSA PCR Screening     Status: None   Collection Time: 11/02/18  5:47 AM  Result Value Ref Range Status   MRSA by PCR NEGATIVE NEGATIVE Final    Comment:        The GeneXpert MRSA Assay (FDA approved for NASAL specimens only), is one component of a comprehensive MRSA colonization surveillance program. It is not intended to diagnose MRSA infection nor to guide or monitor treatment for MRSA infections. Performed at [facility], [place] [address], [place], [place] [address]          Radiology Studies: Dg Chest 2 View  Result Date: 11/01/2018 CLINICAL DATA:  85 year old male with altered mental status EXAM: CHEST - 2 VIEW COMPARISON:  10/07/2016 FINDINGS: Cardiomediastinal silhouette unchanged in size and contour. No evidence of central vascular congestion. No pneumothorax. No pleural effusion. Patchy opacities at the bilateral lung bases. No displaced fracture. Degenerative changes of the spine IMPRESSION: Patchy opacities bilateral lungs, potentially developing infection and/or atelectasis. Electronically Signed   By: Corrie Mckusick D.O.   On: 11/01/2018 14:53   Ct Head Wo Contrast  Result Date: 11/01/2018 CLINICAL DATA:  Altered mental status and decreased responsiveness EXAM: CT HEAD WITHOUT CONTRAST TECHNIQUE: Contiguous axial images were obtained from the base of the skull through the vertex without intravenous contrast. COMPARISON:  Brain MRI July 21, 2017; head CT August 08, 2018 FINDINGS: Brain: Mild diffuse atrophy is stable. There remains edema throughout much of the inferior left temporal lobe. A known meningioma arising in the medial left temporal lobe is better appreciated on prior MR. There is subtle increased  attenuation in the area of the known meningioma, stable, measuring 2.2 x 0.9 cm. No similar appearing edema elsewhere. No other mass evident. There is no hemorrhage, extra-axial fluid collection, or midline shift. There is patchy small vessel disease in the  centra semiovale bilaterally. No acute infarct is demonstrable on this study. Vascular: There is no appreciable hyperdense vessel. There is calcification in the distal left vertebral artery as well as in both carotid siphon regions. Skull: The bony calvarium appears intact. Sinuses/Orbits: There is mucosal thickening in several ethmoid air cells bilaterally. Other visualized paranasal sinuses are clear. Visualized orbits appear symmetric bilaterally. Other: Mastoid air cells are clear. IMPRESSION: Persistent vasogenic edema in the left temporal lobe adjacent to a known meningioma, not felt to be appreciably changed in size or contour. No new edema evident. No hemorrhage. There is underlying atrophy with periventricular small vessel disease. No evident acute infarct. There are foci of arterial vascular calcification. There is mucosal thickening in several ethmoid air cells bilaterally. Electronically Signed   By: Lowella Grip III M.D.   On: 11/01/2018 15:34        Scheduled Meds:  amLODipine  10 mg Oral Daily   enoxaparin (LOVENOX) injection  40 mg Subcutaneous Q24H   rosuvastatin  5 mg Oral Daily   Continuous Infusions:  sodium chloride 100 mL/hr at 11/01/18 2227   piperacillin-tazobactam (ZOSYN)  IV 3.375 g (11/02/18 0607)   vancomycin       LOS: 1 day    Time spent: 30 minutes    Lamica Mccart Darleen Crocker, DO [facility] Pager [phone]  If 7PM-7AM, please contact night-coverage [email or web address] Password [email or web address] 11/02/2018, 9:11 AM

## 2018-11-02 NOTE — Plan of Care (Signed)
  Problem: Education: Goal: Knowledge of General Education information will improve Description Including pain rating scale, medication(s)/side effects and non-pharmacologic comfort measures Outcome: Progressing   Problem: Education: Goal: Knowledge of General Education information will improve Description Problem: Nutrition: Goal: Adequate nutrition will be maintained 11/02/2018 0203 by Jolaine Click, RN Outcome: Progressing 11/02/2018 0203 by Jolaine Click, RN Outcome: Progressing   Problem: Safety: Goal: Ability to remain free from injury will improve Outcome: Progressing   Including pain rating scale, medication(s)/side effects and non-pharmacologic comfort measures Outcome: Progressing   Problem: Health Behavior/Discharge Planning: Goal: Ability to manage health-related needs will improve Outcome: Progressing

## 2018-11-03 ENCOUNTER — Other Ambulatory Visit: Payer: Self-pay

## 2018-11-03 ENCOUNTER — Inpatient Hospital Stay (HOSPITAL_COMMUNITY): Payer: Medicare HMO

## 2018-11-03 ENCOUNTER — Encounter (HOSPITAL_COMMUNITY): Payer: Self-pay

## 2018-11-03 DIAGNOSIS — Z7189 Other specified counseling: Secondary | ICD-10-CM

## 2018-11-03 DIAGNOSIS — G9341 Metabolic encephalopathy: Secondary | ICD-10-CM

## 2018-11-03 DIAGNOSIS — I340 Nonrheumatic mitral (valve) insufficiency: Secondary | ICD-10-CM

## 2018-11-03 DIAGNOSIS — Z515 Encounter for palliative care: Secondary | ICD-10-CM

## 2018-11-03 DIAGNOSIS — I10 Essential (primary) hypertension: Secondary | ICD-10-CM

## 2018-11-03 DIAGNOSIS — A419 Sepsis, unspecified organism: Principal | ICD-10-CM

## 2018-11-03 LAB — BASIC METABOLIC PANEL
Anion gap: 11 (ref 5–15)
BUN: 13 mg/dL (ref 8–23)
CO2: 24 mmol/L (ref 22–32)
Calcium: 9.2 mg/dL (ref 8.9–10.3)
Chloride: 101 mmol/L (ref 98–111)
Creatinine, Ser: 0.98 mg/dL (ref 0.61–1.24)
GFR calc Af Amer: >60 mL/min (ref >60)
GFR calc non Af Amer: >60 mL/min (ref >60)
Glucose, Bld: 122 mg/dL — ABNORMAL HIGH (ref 70–99)
Potassium: 3.7 mmol/L (ref 3.5–5.1)
Sodium: 136 mmol/L (ref 135–145)

## 2018-11-03 LAB — CBC
HCT: 37.3 % — ABNORMAL LOW (ref 39.0–52.0)
Hemoglobin: 13 g/dL (ref 13.0–17.0)
MCH: 32.5 pg (ref 26.0–34.0)
MCHC: 34.9 g/dL (ref 30.0–36.0)
MCV: 93.3 fL (ref 80.0–100.0)
Platelets: 202 10*3/uL (ref 150–400)
RBC: 4 MIL/uL — ABNORMAL LOW (ref 4.22–5.81)
RDW: 12 % (ref 11.5–15.5)
WBC: 10.8 10*3/uL — ABNORMAL HIGH (ref 4.0–10.5)
nRBC: 0 % (ref 0.0–0.2)

## 2018-11-03 LAB — ECHOCARDIOGRAM COMPLETE
Height: 67 in
Weight: 2680.79 oz

## 2018-11-03 LAB — PROCALCITONIN: Procalcitonin: <0.1 ng/mL

## 2018-11-03 LAB — LACTIC ACID, PLASMA: Lactic Acid, Venous: 1.1 mmol/L (ref 0.5–1.9)

## 2018-11-03 MED ORDER — HALOPERIDOL LACTATE 5 MG/ML IJ SOLN
1.0000 mg | Freq: Four times a day (QID) | INTRAMUSCULAR | Status: DC | PRN
  Administered 2018-11-04: 2 mg via INTRAVENOUS
  Filled 2018-11-03: qty 1

## 2018-11-03 NOTE — Consult Note (Signed)
Consultation Note Date: 11/03/2018   Patient Name: [name] [name]  DOB: 05-Apr-1925  MRN: [id number]  Age / Sex: 85 y.o., male  PCP: Deland Pretty, MD Referring Physician: Aline August, MD  Reason for Consultation: Establishing goals of care  HPI/Patient Profile: 85 y.o. male  with past medical history of amnesia, BPH, bradycardia, CAD, colon polyps, HTN, HLD, mitral valve regurgitation, and CHF admitted on 11/01/2018 after he was found down at his independent living facility. He is being treated for sepsis secondary to multifocal pna with IV antibiotics. PMT consulted for [place].  Clinical Assessment and Goals of Care: I have reviewed medical records including EPIC notes, labs and imaging, received report from RN, and then spoke with patient's daughter, Gwinda Passe,  to discuss diagnosis prognosis, GOC, EOL wishes, disposition and options.  I introduced Palliative Medicine as specialized medical care for people living with serious illness. It focuses on providing relief from the symptoms and stress of a serious illness. The goal is to improve quality of life for both the patient and the family.  Patient is widowed. He has been living at [facility] in [place] living. Patient has 2 daughters and 1 son. They are out of town.    As far as functional and nutritional status, Gwinda Passe tells me he is mostly independent. Able to care for himself. Good appetite. Some small problems with memory but mostly cognitively intact.  Gwinda Passe does note that patient has had some mild swallowing problems - she is thankful that a swallowing study has been ordered.    We discussed his current illness and what it means in the larger context of his on-going co-morbidities.  Gwinda Passe has good understanding of his illness.  I attempted to elicit values and goals of care important to the patient.  Gwinda Passe shares that patient's independence is what is most important to him.   Advance directives, concepts specific to code status, artifical feeding and hydration, and rehospitalization were considered and discussed. Gwinda Passe mentioned in out conversation that her dad would not want "heroic interventions". We discussed what this means for her dad. She is going to reach out to her sister [name] and call back later today with further guidance about advance directives and specifically code status. She believes her dad has a DNR order at his facility.   Questions and concerns were addressed. The family was encouraged to call with questions or concerns.   Spoke with patient's other daughter [name] later in the day - both daughter's confirm patient should be DNR.   Primary Decision Maker NEXT OF KIN - children making decisions jointly    SUMMARY OF RECOMMENDATIONS   - continue current measures - Gwinda Passe is hopeful patient can be discharged to ALF - Code status changed to DNR per conversation with both daughters  Code Status/Advance Care Planning:  DNR  Palliative Prophylaxis:   Delirium Protocol and Frequent Pain Assessment  Prognosis:   Unable to determine  Discharge Planning: family hopeful for ALF      Primary Diagnoses: Present on Admission: . Multifocal pneumonia . Hypertension   I have reviewed the medical record, interviewed the patient and family, and examined the patient. The following aspects are pertinent.  Past Medical History:  Diagnosis Date  . Allergic rhinitis   . Amnesia   . BPH (benign prostatic hyperplasia)   . Bradycardia   . Colon polyps   . Coronary artery disease    MODERATE  . Dizziness   . Edema of lower extremity   .  Erectile dysfunction   . H. pylori infection    Hx of   . Hard of hearing   . Headache(784.0)   . History of colon polyps 2009   Dr. Orr/Colonoscopy -small cecal adenoma  . Hx of colonoscopy 2009   Dr Lajoyce Corners, hemorrhoids & polyps  . Hyperlipidemia   . Hypertension   . Internal hemorrhoids 2009   Dr. Lajoyce Corners  Colonoscopy  . Mitral valve regurgitation   . Reflux esophagitis    Social History   Socioeconomic History  . Marital status: Widowed    Spouse name: Not on file  . Number of children: 3  . Years of education: Not on file  . Highest education level: Not on file  Occupational History  . Not on file  Social Needs  . Financial resource strain: Not on file  . Food insecurity:    Worry: Not on file    Inability: Not on file  . Transportation needs:    Medical: Not on file    Non-medical: Not on file  Tobacco Use  . Smoking status: Former Smoker    Last attempt to quit: 10/25/1975    Years since quitting: 43.0  . Smokeless tobacco: Never Used  Substance and Sexual Activity  . Alcohol use: Yes    Comment: occ 6-7 times a year  . Drug use: No  . Sexual activity: Not on file  Lifestyle  . Physical activity:    Days per week: Not on file    Minutes per session: Not on file  . Stress: Not on file  Relationships  . Social connections:    Talks on phone: Not on file    Gets together: Not on file    Attends religious service: Not on file    Active member of club or organization: Not on file    Attends meetings of clubs or organizations: Not on file    Relationship status: Not on file  Other Topics Concern  . Not on file  Social History Narrative   Retired and widowed   Lives at [place]   Right handed   Family History  Problem Relation Age of Onset  . Colon cancer Mother 50       second cancer in 40's deceased after emergency surgery  . Breast cancer Sister    Scheduled Meds: . amLODipine  10 mg Oral Daily  . enoxaparin (LOVENOX) injection  40 mg Subcutaneous Q24H  . rosuvastatin  5 mg Oral Daily   Continuous Infusions: . piperacillin-tazobactam (ZOSYN)  IV 3.375 g (11/03/18 0924)   PRN Meds:.acetaminophen No Known Allergies  Vital Signs: BP (!) 165/66   Pulse 72   Temp 98.1 F (36.7 C) (Oral)   Resp 16   Ht 5\' 7"  (1.702 m)   Wt 76 kg   SpO2  98%   BMI 26.24 kg/m  Pain Scale: 0-10   Pain Score: 0-No pain   SpO2: SpO2: 98 % O2 Device:SpO2: 98 % O2 Flow Rate: .   IO: Intake/output summary:   Intake/Output Summary (Last 24 hours) at 11/03/2018 1302 Last data filed at 11/03/2018 0310 Gross per 24 hour  Intake 495.76 ml  Output 700 ml  Net -204.24 ml    LBM: Last BM Date: (PTA) Baseline Weight: Weight: 76 kg Most recent weight: Weight: 76 kg     Palliative Assessment/Data: PPS 50%    The above conversation was completed via telephone due to the visitor restrictions during the COVID-19 pandemic. Thorough  chart review and discussion with necessary members of the care team was completed as part of assessment. All issues were discussed and addressed but no physical exam was performed.   Time Total: 50 minutes Greater than 50%  of this time was spent counseling and coordinating care related to the above assessment and plan.  Juel Burrow, DNP, AGNP-C Palliative Medicine Team [phone] Pager: [phone]

## 2018-11-03 NOTE — Progress Notes (Signed)
Physical Therapy Treatment Patient Details Name: [name] [name] MRN: [id number] DOB: [birth year] Today's Date: 11/03/2018    History of Present Illness [name] [name] is a 85 y.o. male with medical history significant of amnesia, BPH, bradycardia, coronary artery disease, colon polyps, hypertension, hyperlipidemia, mitral valve regurgitation, diastolic congestive heart failure, reflux esophagitis, and conditions listed below presenting to the hospital for evaluation of altered mental status.  He was found by his nursing home staff at 1315 lying in between the bed and floor on his left side.  He was last seen normal about 25 hours prior to ED arrival.  Found to be nonverbal at triage but at baseline is verbal and able to ambulate.    PT Comments    Patient seen for mobility progression. Pt requires min A for bed mobility and OOB this session. Pt continues to be oriented to self only and tangential during session. Continue to progress as tolerated with anticipated d/c to SNF for further skilled PT services.     Follow Up Recommendations  SNF;Supervision/Assistance - 24 hour     Equipment Recommendations  None recommended by PT(Patient states he has a RW)    Recommendations for Other Services       Precautions / Restrictions Precautions Precautions: Fall Restrictions Weight Bearing Restrictions: No    Mobility  Bed Mobility Overal bed mobility: Needs Assistance Bed Mobility: Supine to Sit     Supine to sit: Min assist     General bed mobility comments: mulitmodal cues for sequencing; pt appeared to believe he was in a chair and that it was "too high up"; assistance to initiate task and elevate trunk into sitting  Transfers Overall transfer level: Needs assistance Equipment used: Rolling walker (2 wheeled) Transfers: Sit to/from Stand Sit to Stand: Min assist         General transfer comment: cues for safety; assist to steady and to power up from  EOB  Ambulation/Gait Ambulation/Gait assistance: Min assist Gait Distance (Feet): 150 Feet Assistive device: Rolling walker (2 wheeled) Gait Pattern/deviations: Step-through pattern;Decreased stride length;Trunk flexed;Drifts right/left Gait velocity: decreased   General Gait Details: cues for safe use of AD and posture; pt easily distracted by environement and requires assistance for balance and guiding RW; pt requires directional cues to return to room; LOB X 1 when turning   Stairs             Wheelchair Mobility    Modified Rankin (Stroke Patients Only) Modified Rankin (Stroke Patients Only) Pre-Morbid Rankin Score: Slight disability Modified Rankin: Moderately severe disability     Balance Overall balance assessment: Needs assistance Sitting-balance support: No upper extremity supported;Feet unsupported Sitting balance-Leahy Scale: Good     Standing balance support: Bilateral upper extremity supported Standing balance-Leahy Scale: Poor Standing balance comment: dependent on RW                            Cognition Arousal/Alertness: Awake/alert Behavior During Therapy: WFL for tasks assessed/performed Overall Cognitive Status: No family/caregiver present to determine baseline cognitive functioning Area of Impairment: Orientation;Attention;Memory;Following commands;Safety/judgement;Problem solving;Awareness                 Orientation Level: Disoriented to;Place;Time;Situation(reports it is 2020 when given choices) Current Attention Level: Focused Memory: Decreased short-term memory Following Commands: Follows one step commands with increased time;Follows one step commands consistently Safety/Judgement: Decreased awareness of safety;Decreased awareness of deficits Awareness: Intellectual Problem Solving: Slow processing;Requires verbal cues General Comments:  pt impulsive, decresaed safety awareness, extremely HOH limiting command follow  ability      Exercises      General Comments        Pertinent Vitals/Pain Pain Assessment: No/denies pain    Home Living                      Prior Function            PT Goals (current goals can now be found in the care plan section) Progress towards PT goals: Progressing toward goals    Frequency    Min 3X/week      PT Plan Current plan remains appropriate    Co-evaluation              AM-PAC PT "6 Clicks" Mobility   Outcome Measure  Help needed turning from your back to your side while in a flat bed without using bedrails?: A Little Help needed moving from lying on your back to sitting on the side of a flat bed without using bedrails?: A Little Help needed moving to and from a bed to a chair (including a wheelchair)?: A Little Help needed standing up from a chair using your arms (e.g., wheelchair or bedside chair)?: A Little Help needed to walk in hospital room?: A Little Help needed climbing 3-5 steps with a railing? : A Lot 6 Click Score: 17    End of Session Equipment Utilized During Treatment: Gait belt Activity Tolerance: Patient tolerated treatment well Patient left: with call bell/phone within reach;in bed;with bed alarm set;with nursing/sitter in room Nurse Communication: Mobility status PT Visit Diagnosis: Unsteadiness on feet (R26.81);Other abnormalities of gait and mobility (R26.89);Repeated falls (R29.6);Muscle weakness (generalized) (M62.81);History of falling (Z91.81);Ataxic gait (R26.0);Difficulty in walking, not elsewhere classified (R26.2);Apraxia (R48.2)     Time: 9147-8295 PT Time Calculation (min) (ACUTE ONLY): 38 min  Charges:  $Gait Training: 8-22 mins $Therapeutic Activity: 8-22 mins                     Earney Navy, PTA Acute Rehabilitation Services Pager: [phone] Office: [phone]     Darliss Cheney 11/03/2018, 11:29 AM

## 2018-11-03 NOTE — Progress Notes (Signed)
  Echocardiogram 2D Echocardiogram has been performed.  Shalik Sanfilippo G Monisha Siebel 11/03/2018, 1:30 PM

## 2018-11-03 NOTE — Progress Notes (Signed)
Patient ID: [name] [name], male   DOB: 02-08-1925, 85 y.o.   MRN: [id number]  PROGRESS NOTE    [name] [name]  DGU:[id number] DOB: 1924-11-10 DOA: 11/01/2018 PCP: Deland Pretty, MD   Brief Narrative:  85 year old male with history of amnesia, BPH, bradycardia, coronary artery disease, colon polyps, hypertension, hyperlipidemia, mitral valve regurgitation, diastolic congestive heart failure, reflux esophagitis presented on 11/01/2018 from his nursing home for altered mental status.  He was nonverbal on presentation but at baseline, he is verbal and able to ambulate apparently.  He was admitted with sepsis secondary to multifocal pneumonia and acute metabolic encephalopathy and was started on IV antibiotics.  Assessment & Plan:   Principal Problem:   Multifocal pneumonia Active Problems:   Hypertension   Sepsis ([place])   Acute metabolic encephalopathy   Hypokalemia  Sepsis: Present on admission -Secondary to multifocal pneumonia -COVID-19 test was negative -Chest x-ray on admission showed patchy opacities in bilateral lungs, concerning for developing infection versus atelectasis -Antibiotics plan as below.  Follow cultures.  Multifocal pneumonia -Continue Zosyn.  MRSA PCR was negative -Follow cultures.  Supplemental oxygen if needed -Procalcitonin was less than 0.1 -SLP evaluation.  If patient is able to swallow, will consider switching to oral Augmentin  Leukocytosis -Improved.  Acute metabolic encephalopathy in a patient with probable dementia -Improving -Patient is currently pleasantly confused.  Unclear if this is his baseline mental status -Ammonia level was normal.  Head CT showed persistent vasogenic edema in the left temporal lobe adjacent to the known meningioma, not felt to be appreciably changed in size or contour -If mental status does not improve, might consider MRI of the brain.  No evidence of seizures -B12 level normal.  TSH normal -Fall precautions.   Aspiration precautions.  Monitor mental status -Palliative care consultation for goals of care discussion.  Fall with questionable syncope -Patient was down on the floor for an unknown period of time at his nursing home.  Unclear if he had a syncopal event -No fall since admission. -Continue PT/OT evaluation -IV fluids discontinued. -Echo -Troponins did not trend up.  No chest pain.  Hypokalemia -Improved with replacement  Chronic diastolic heart failure -Stable.  Monitor.  Hypertension  -continue amlodipine  Hyperlipidemia -Continue Crestor   DVT prophylaxis: Lovenox Code Status: Full Family Communication: We will try to get in touch with family today on phone.  None at bedside Disposition Plan: SNF in 1 to 2 days if clinically improved  Consultants: Palliative care  Procedures: None  Antimicrobials: Zosyn from 11/01/2018 onwards Vancomycin 1 dose on 11/01/2018   Subjective: Patient seen and examined at bedside. he is pleasantly confused and a very poor historian.  No overnight fever or vomiting reported by nursing staff.  He has been trying to get out of bed and trying to pull out his IV line.  Objective: Vitals:   11/02/18 1741 11/02/18 2235 11/03/18 0556 11/03/18 0922  BP: (!) 145/48 (!) 161/63 (!) 153/59 (!) 165/66  Pulse: 71 65 72   Resp: 17 16 16    Temp: 97.8 F (36.6 C) 98 F (36.7 C) 98.1 F (36.7 C)   TempSrc: Oral Oral Oral   SpO2: 97% 97% 98%   Weight:      Height:        Intake/Output Summary (Last 24 hours) at 11/03/2018 1055 Last data filed at 11/03/2018 0310 Gross per 24 hour  Intake 839.76 ml  Output 700 ml  Net 139.76 ml   Filed Weights   11/01/18  1500  Weight: 76 kg    Examination:  General exam: Appears calm and comfortable.  Pleasantly confused.  Elderly male lying in bed. Respiratory system: Bilateral decreased breath sounds at bases with some scattered crackles Cardiovascular system: S1 & S2 heard, Rate  controlled Gastrointestinal system: Abdomen is nondistended, soft and nontender. Normal bowel sounds heard. Extremities: No cyanosis, clubbing, edema    Data Reviewed: I have personally reviewed following labs and imaging studies  CBC: Recent Labs  Lab 11/01/18 1405 11/02/18 0222 11/03/18 0337  WBC 16.9* 12.8* 10.8*  NEUTROABS 15.0*  --   --   HGB 14.9 12.9* 13.0  HCT 42.9 36.7* 37.3*  MCV 94.3 93.6 93.3  PLT 223 192 979   Basic Metabolic Panel: Recent Labs  Lab 11/01/18 1405 11/01/18 2348 11/02/18 0222 11/03/18 0337  NA 136  --  134* 136  K 3.4*  --  3.3* 3.7  CL 97*  --  99 101  CO2 25  --  24 24  GLUCOSE 129*  --  114* 122*  BUN 13  --  10 13  CREATININE 1.03  --  0.87 0.98  CALCIUM 9.5  --  8.7* 9.2  MG  --  1.8  --   --    GFR: Estimated Creatinine Clearance: 45.9 mL/min (by C-G formula based on SCr of 0.98 mg/dL). Liver Function Tests: Recent Labs  Lab 11/01/18 1624  AST 31  ALT 24  ALKPHOS 64  BILITOT 1.2  PROT 6.6  ALBUMIN 4.1   No results for input(s): LIPASE, AMYLASE in the last 168 hours. Recent Labs  Lab 11/01/18 1624  AMMONIA 22   Coagulation Profile: No results for input(s): INR, PROTIME in the last 168 hours. Cardiac Enzymes: Recent Labs  Lab 11/01/18 1405 11/01/18 1936 11/02/18 0222 11/02/18 0827  CKTOTAL 343  --   --   --   TROPONINI  --  0.06* 0.05* 0.04*   BNP (last 3 results) No results for input(s): PROBNP in the last 8760 hours. HbA1C: No results for input(s): HGBA1C in the last 72 hours. CBG: No results for input(s): GLUCAP in the last 168 hours. Lipid Profile: No results for input(s): CHOL, HDL, LDLCALC, TRIG, CHOLHDL, LDLDIRECT in the last 72 hours. Thyroid Function Tests: Recent Labs    11/01/18 2348  TSH 1.504  FREET4 0.95   Anemia Panel: Recent Labs    11/01/18 2348  VITAMINB12 1,261*   Sepsis Labs: Recent Labs  Lab 11/01/18 1624 11/02/18 0222 11/03/18 0337  PROCALCITON  --  <0.10 <0.10   LATICACIDVEN 1.8  --  1.1    Recent Results (from the past 240 hour(s))  SARS Coronavirus 2 (CEPHEID- Performed in [place] hospital lab), Hosp Order     Status: None   Collection Time: 11/01/18  4:49 PM  Result Value Ref Range Status   SARS Coronavirus 2 NEGATIVE NEGATIVE Final    Comment: (NOTE) If result is NEGATIVE SARS-CoV-2 target nucleic acids are NOT DETECTED. The SARS-CoV-2 RNA is generally detectable in upper and lower  respiratory specimens during the acute phase of infection. The lowest  concentration of SARS-CoV-2 viral copies this assay can detect is 250  copies / mL. A negative result does not preclude SARS-CoV-2 infection  and should not be used as the sole basis for treatment or other  patient management decisions.  A negative result may occur with  improper specimen collection / handling, submission of specimen other  than nasopharyngeal swab, presence of viral mutation(s)  within the  areas targeted by this assay, and inadequate number of viral copies  (<250 copies / mL). A negative result must be combined with clinical  observations, patient history, and epidemiological information. If result is POSITIVE SARS-CoV-2 target nucleic acids are DETECTED. The SARS-CoV-2 RNA is generally detectable in upper and lower  respiratory specimens dur ing the acute phase of infection.  Positive  results are indicative of active infection with SARS-CoV-2.  Clinical  correlation with patient history and other diagnostic information is  necessary to determine patient infection status.  Positive results do  not rule out bacterial infection or co-infection with other viruses. If result is PRESUMPTIVE POSTIVE SARS-CoV-2 nucleic acids MAY BE PRESENT.   A presumptive positive result was obtained on the submitted specimen  and confirmed on repeat testing.  While 2019 novel coronavirus  (SARS-CoV-2) nucleic acids may be present in the submitted sample  additional confirmatory  testing may be necessary for epidemiological  and / or clinical management purposes  to differentiate between  SARS-CoV-2 and other Sarbecovirus currently known to infect humans.  If clinically indicated additional testing with an alternate test  methodology [phone]) is advised. The SARS-CoV-2 RNA is generally  detectable in upper and lower respiratory sp ecimens during the acute  phase of infection. The expected result is Negative. Fact Sheet for Patients:  StrictlyIdeas.no Fact Sheet for Healthcare Providers: BankingDealers.co.za This test is not yet approved or cleared by the [place] FDA and has been authorized for detection and/or diagnosis of SARS-CoV-2 by FDA under an Emergency Use Authorization (EUA).  This EUA will remain in effect (meaning this test can be used) for the duration of the COVID-19 declaration under Section 564(b)(1) of the Act, 21 U.S.C. section 360bbb-3(b)(1), unless the authorization is terminated or revoked sooner. Performed at [facility], [place] [address], [place], [place] [address]   Blood culture (routine x 2)     Status: None (Preliminary result)   Collection Time: 11/01/18  4:57 PM  Result Value Ref Range Status   Specimen Description BLOOD LEFT ANTECUBITAL  Final   Special Requests   Final    BOTTLES DRAWN AEROBIC AND ANAEROBIC Blood Culture adequate volume   Culture   Final    NO GROWTH < 24 HOURS Performed at [facility], [place] [address], [place], [place] [address]    Report Status PENDING  Incomplete  Blood culture (routine x 2)     Status: None (Preliminary result)   Collection Time: 11/01/18  4:57 PM  Result Value Ref Range Status   Specimen Description BLOOD RIGHT ANTECUBITAL  Final   Special Requests   Final    BOTTLES DRAWN AEROBIC AND ANAEROBIC Blood Culture results may not be optimal due to an inadequate volume of blood received in culture bottles   Culture   Final    NO  GROWTH < 24 HOURS Performed at [facility], [place] [address] [place], [place] [address]    Report Status PENDING  Incomplete  MRSA PCR Screening     Status: None   Collection Time: 11/02/18  5:47 AM  Result Value Ref Range Status   MRSA by PCR NEGATIVE NEGATIVE Final    Comment:        The GeneXpert MRSA Assay (FDA approved for NASAL specimens only), is one component of a comprehensive MRSA colonization surveillance program. It is not intended to diagnose MRSA infection nor to guide or monitor treatment for MRSA infections. Performed at  [facility], [place] [address], [place], [place] [address]          Radiology Studies: Dg Chest 2 View  Result Date: 11/01/2018 CLINICAL DATA:  85 year old male with altered mental status EXAM: CHEST - 2 VIEW COMPARISON:  10/07/2016 FINDINGS: Cardiomediastinal silhouette unchanged in size and contour. No evidence of central vascular congestion. No pneumothorax. No pleural effusion. Patchy opacities at the bilateral lung bases. No displaced fracture. Degenerative changes of the spine IMPRESSION: Patchy opacities bilateral lungs, potentially developing infection and/or atelectasis. Electronically Signed   By: Corrie Mckusick D.O.   On: 11/01/2018 14:53   Ct Head Wo Contrast  Result Date: 11/01/2018 CLINICAL DATA:  Altered mental status and decreased responsiveness EXAM: CT HEAD WITHOUT CONTRAST TECHNIQUE: Contiguous axial images were obtained from the base of the skull through the vertex without intravenous contrast. COMPARISON:  Brain MRI July 21, 2017; head CT August 08, 2018 FINDINGS: Brain: Mild diffuse atrophy is stable. There remains edema throughout much of the inferior left temporal lobe. A known meningioma arising in the medial left temporal lobe is better appreciated on prior MR. There is subtle increased attenuation in the area of the known meningioma, stable, measuring 2.2 x 0.9 cm. No similar appearing edema elsewhere. No other  mass evident. There is no hemorrhage, extra-axial fluid collection, or midline shift. There is patchy small vessel disease in the centra semiovale bilaterally. No acute infarct is demonstrable on this study. Vascular: There is no appreciable hyperdense vessel. There is calcification in the distal left vertebral artery as well as in both carotid siphon regions. Skull: The bony calvarium appears intact. Sinuses/Orbits: There is mucosal thickening in several ethmoid air cells bilaterally. Other visualized paranasal sinuses are clear. Visualized orbits appear symmetric bilaterally. Other: Mastoid air cells are clear. IMPRESSION: Persistent vasogenic edema in the left temporal lobe adjacent to a known meningioma, not felt to be appreciably changed in size or contour. No new edema evident. No hemorrhage. There is underlying atrophy with periventricular small vessel disease. No evident acute infarct. There are foci of arterial vascular calcification. There is mucosal thickening in several ethmoid air cells bilaterally. Electronically Signed   By: Lowella Grip III M.D.   On: 11/01/2018 15:34        Scheduled Meds:  amLODipine  10 mg Oral Daily   enoxaparin (LOVENOX) injection  40 mg Subcutaneous Q24H   rosuvastatin  5 mg Oral Daily   Continuous Infusions:  piperacillin-tazobactam (ZOSYN)  IV 3.375 g (11/03/18 0924)     LOS: 2 days        Aline August, MD [facility] 11/03/2018, 10:55 AM

## 2018-11-03 NOTE — Progress Notes (Signed)
Pt becoming very combative. He is continuously trying to get out of bed and ignoring tele sitter commands. Pot., has tried to remove IV. Doctor notified. Charge nurse notified. Will continue to monitor and check on him.

## 2018-11-03 NOTE — Progress Notes (Signed)
Pt is very confused.  Climbing out of bed and not redirectable.  He climbed out of bed and broke off his IV tubing.  He bled all over his sheets and the floor.  I put in an IV consult.  And passed on to charge that this patient may need a Midwife.

## 2018-11-03 NOTE — Evaluation (Signed)
Clinical/Bedside Swallow Evaluation Patient Details  Name: [name] [name] MRN: [id number] Date of Birth: 1925-01-16  Today's Date: 11/03/2018 Time: SLP Start Time (ACUTE ONLY): 1441 SLP Stop Time (ACUTE ONLY): 1452 SLP Time Calculation (min) (ACUTE ONLY): 11 min  Past Medical History:  Past Medical History:  Diagnosis Date  . Allergic rhinitis   . Amnesia   . BPH (benign prostatic hyperplasia)   . Bradycardia   . Colon polyps   . Coronary artery disease    MODERATE  . Dizziness   . Edema of lower extremity   . Erectile dysfunction   . H. pylori infection    Hx of   . Hard of hearing   . Headache(784.0)   . History of colon polyps 2009   Dr. Orr/Colonoscopy -small cecal adenoma  . Hx of colonoscopy 2009   Dr Lajoyce Corners, hemorrhoids & polyps  . Hyperlipidemia   . Hypertension   . Internal hemorrhoids 2009   Dr. Lajoyce Corners Colonoscopy  . Mitral valve regurgitation   . Reflux esophagitis    Past Surgical History:  Past Surgical History:  Procedure Laterality Date  . APPENDECTOMY    . CARDIAC CATHETERIZATION  1997   REVEALED MILD TO MODERATE IRREGULARITIES. HIS LEFT EVNTRICULAR SYSTOLIC FUNCTION REVEALED NORMAL EJECTION FRACTION WITH EF OF 70%  . CATARACT EXTRACTION, BILATERAL    . COLONOSCOPY  multiple  . fatty tumor removal     benign  . HEMORROIDECTOMY    . HIP ARTHROPLASTY Left 08/17/2013   Procedure: ARTHROPLASTY BIPOLAR HIP;  Surgeon: Gearlean Alf, MD;  Location: [facility];  Service: Orthopedics;  Laterality: Left;  . INGUINAL HERNIA REPAIR Bilateral 2006  . lower back surgery  2006  . NASAL SEPTUM SURGERY    . TONSILLECTOMY    . UPPER GASTROINTESTINAL ENDOSCOPY     HPI:   85 y.o. male with medical history significant for amnesia, BPH, bradycardia, coronary artery disease, colon polyps, hypertension, hyperlipidemia, mitral valve regurgitation, diastolic congestive heart failure, reflux esophagitis, and conditions listed below presenting to the hospital for evaluation of  altered mental status.  He was found by Independent Living staff ([facility]) at 1315 lying in between the bed and floor on his left side.  He was last seen normal about 25 hours prior to ED arrival.  Found to be nonverbal at triage but at baseline is verbal and able to ambulate.  Admitted with sepsis secondary to multifocal pneumonia and acute metabolic encephalopathy and was started on IV antibiotics. CXR 5/18 Patchy opacities bilateral lungs, potentially developing infection and/or atx.   Assessment / Plan / Recommendation Clinical Impression  Pt confused, difficult to redirect, not following specific commands.  However, he engaged in clinical swallow evaluation demonstrating adequate oral attention and mastication, brisk swallow response, no s/s of aspiration with any tested consistencies.  Passed three oz water test.  Unable to participate in oral mechanism exam, but no focal deficits noted. Recommend continuing current regular diet, thin liquids.  No concerns at this time for dysphagia-related aspiration.  SLP to sign off.  SLP Visit Diagnosis: Dysphagia, unspecified (R13.10)    Aspiration Risk  No limitations    Diet Recommendation   regular solids, thin liquids  Medication Administration: Whole meds with liquid    Other  Recommendations Oral Care Recommendations: Oral care BID   Follow up Recommendations None      Frequency and Duration            Prognosis  Swallow Study   General Date of Onset: 11/01/18 HPI:  85 y.o. male with medical history significant for amnesia, BPH, bradycardia, coronary artery disease, colon polyps, hypertension, hyperlipidemia, mitral valve regurgitation, diastolic congestive heart failure, reflux esophagitis, and conditions listed below presenting to the hospital for evaluation of altered mental status.  He was found by Independent Living staff ([facility]) at 1315 lying in between the bed and floor on his left side.  He was last  seen normal about 25 hours prior to ED arrival.  Found to be nonverbal at triage but at baseline is verbal and able to ambulate.  admitted with sepsis secondary to multifocal pneumonia and acute metabolic encephalopathy and was started on IV antibiotics. CXR 5/18 Patchy opacities bilateral lungs, potentially developing infection and/or atx. Type of Study: Bedside Swallow Evaluation Previous Swallow Assessment: no Diet Prior to this Study: Regular;Thin liquids Temperature Spikes Noted: No Respiratory Status: Room air History of Recent Intubation: No Behavior/Cognition: Alert;Agitated;Impulsive Oral Cavity Assessment: Within Functional Limits Oral Care Completed by SLP: No Oral Cavity - Dentition: Adequate natural dentition Vision: Functional for self-feeding Self-Feeding Abilities: Able to feed self Patient Positioning: Upright in bed Baseline Vocal Quality: Normal Volitional Cough: Cognitively unable to elicit Volitional Swallow: Unable to elicit    Oral/Motor/Sensory Function Overall Oral Motor/Sensory Function: Within functional limits   Ice Chips Ice chips: Within functional limits   Thin Liquid Thin Liquid: Within functional limits    Nectar Thick Nectar Thick Liquid: Not tested   Honey Thick Honey Thick Liquid: Not tested   Puree Puree: Within functional limits   Solid     Solid: Within functional limits      Juan Quam Laurice 11/03/2018,3:03 PM   Estill Bamberg L. Tivis Ringer, [place] Office number [phone] Pager [phone]

## 2018-11-04 DIAGNOSIS — R32 Unspecified urinary incontinence: Secondary | ICD-10-CM | POA: Diagnosis not present

## 2018-11-04 DIAGNOSIS — R1312 Dysphagia, oropharyngeal phase: Secondary | ICD-10-CM | POA: Diagnosis not present

## 2018-11-04 DIAGNOSIS — M25662 Stiffness of left knee, not elsewhere classified: Secondary | ICD-10-CM | POA: Diagnosis not present

## 2018-11-04 DIAGNOSIS — Z7189 Other specified counseling: Secondary | ICD-10-CM | POA: Diagnosis not present

## 2018-11-04 DIAGNOSIS — R2681 Unsteadiness on feet: Secondary | ICD-10-CM | POA: Diagnosis not present

## 2018-11-04 DIAGNOSIS — N3946 Mixed incontinence: Secondary | ICD-10-CM | POA: Diagnosis not present

## 2018-11-04 DIAGNOSIS — I2510 Atherosclerotic heart disease of native coronary artery without angina pectoris: Secondary | ICD-10-CM | POA: Diagnosis not present

## 2018-11-04 DIAGNOSIS — G9341 Metabolic encephalopathy: Secondary | ICD-10-CM | POA: Diagnosis not present

## 2018-11-04 DIAGNOSIS — Z20828 Contact with and (suspected) exposure to other viral communicable diseases: Secondary | ICD-10-CM | POA: Diagnosis not present

## 2018-11-04 DIAGNOSIS — Z515 Encounter for palliative care: Secondary | ICD-10-CM | POA: Diagnosis not present

## 2018-11-04 DIAGNOSIS — I5032 Chronic diastolic (congestive) heart failure: Secondary | ICD-10-CM | POA: Diagnosis not present

## 2018-11-04 DIAGNOSIS — R29898 Other symptoms and signs involving the musculoskeletal system: Secondary | ICD-10-CM | POA: Diagnosis not present

## 2018-11-04 DIAGNOSIS — E786 Lipoprotein deficiency: Secondary | ICD-10-CM | POA: Diagnosis not present

## 2018-11-04 DIAGNOSIS — A419 Sepsis, unspecified organism: Secondary | ICD-10-CM

## 2018-11-04 DIAGNOSIS — E785 Hyperlipidemia, unspecified: Secondary | ICD-10-CM | POA: Diagnosis not present

## 2018-11-04 DIAGNOSIS — Z9181 History of falling: Secondary | ICD-10-CM | POA: Diagnosis not present

## 2018-11-04 DIAGNOSIS — R1310 Dysphagia, unspecified: Secondary | ICD-10-CM | POA: Diagnosis not present

## 2018-11-04 DIAGNOSIS — J189 Pneumonia, unspecified organism: Secondary | ICD-10-CM | POA: Diagnosis not present

## 2018-11-04 DIAGNOSIS — E876 Hypokalemia: Secondary | ICD-10-CM | POA: Diagnosis not present

## 2018-11-04 DIAGNOSIS — R531 Weakness: Secondary | ICD-10-CM | POA: Diagnosis not present

## 2018-11-04 DIAGNOSIS — M2550 Pain in unspecified joint: Secondary | ICD-10-CM | POA: Diagnosis not present

## 2018-11-04 DIAGNOSIS — M6281 Muscle weakness (generalized): Secondary | ICD-10-CM | POA: Diagnosis not present

## 2018-11-04 DIAGNOSIS — Z7401 Bed confinement status: Secondary | ICD-10-CM | POA: Diagnosis not present

## 2018-11-04 DIAGNOSIS — M150 Primary generalized (osteo)arthritis: Secondary | ICD-10-CM | POA: Diagnosis not present

## 2018-11-04 DIAGNOSIS — M25661 Stiffness of right knee, not elsewhere classified: Secondary | ICD-10-CM | POA: Diagnosis not present

## 2018-11-04 DIAGNOSIS — I10 Essential (primary) hypertension: Secondary | ICD-10-CM | POA: Diagnosis not present

## 2018-11-04 HISTORY — DX: Sepsis, unspecified organism: A41.9

## 2018-11-04 LAB — COMPREHENSIVE METABOLIC PANEL
ALT: 22 U/L (ref 0–44)
AST: 26 U/L (ref 15–41)
Albumin: 3.5 g/dL (ref 3.5–5.0)
Alkaline Phosphatase: 48 U/L (ref 38–126)
Anion gap: 10 (ref 5–15)
BUN: 9 mg/dL (ref 8–23)
CO2: 23 mmol/L (ref 22–32)
Calcium: 9.1 mg/dL (ref 8.9–10.3)
Chloride: 103 mmol/L (ref 98–111)
Creatinine, Ser: 0.94 mg/dL (ref 0.61–1.24)
GFR calc Af Amer: >60 mL/min (ref >60)
GFR calc non Af Amer: >60 mL/min (ref >60)
Glucose, Bld: 107 mg/dL — ABNORMAL HIGH (ref 70–99)
Potassium: 3.3 mmol/L — ABNORMAL LOW (ref 3.5–5.1)
Sodium: 136 mmol/L (ref 135–145)
Total Bilirubin: 1.8 mg/dL — ABNORMAL HIGH (ref 0.3–1.2)
Total Protein: 5.8 g/dL — ABNORMAL LOW (ref 6.5–8.1)

## 2018-11-04 LAB — CBC WITH DIFFERENTIAL/PLATELET
Abs Immature Granulocytes: 0.03 10*3/uL (ref 0.00–0.07)
Basophils Absolute: 0 10*3/uL (ref 0.0–0.1)
Basophils Relative: 0 %
Eosinophils Absolute: 0.1 10*3/uL (ref 0.0–0.5)
Eosinophils Relative: 1 %
HCT: 36.8 % — ABNORMAL LOW (ref 39.0–52.0)
Hemoglobin: 12.8 g/dL — ABNORMAL LOW (ref 13.0–17.0)
Immature Granulocytes: 0 %
Lymphocytes Relative: 21 %
Lymphs Abs: 1.9 10*3/uL (ref 0.7–4.0)
MCH: 32.2 pg (ref 26.0–34.0)
MCHC: 34.8 g/dL (ref 30.0–36.0)
MCV: 92.5 fL (ref 80.0–100.0)
Monocytes Absolute: 1.1 10*3/uL — ABNORMAL HIGH (ref 0.1–1.0)
Monocytes Relative: 12 %
Neutro Abs: 6 10*3/uL (ref 1.7–7.7)
Neutrophils Relative %: 66 %
Platelets: 186 10*3/uL (ref 150–400)
RBC: 3.98 MIL/uL — ABNORMAL LOW (ref 4.22–5.81)
RDW: 11.9 % (ref 11.5–15.5)
WBC: 9 10*3/uL (ref 4.0–10.5)
nRBC: 0 % (ref 0.0–0.2)

## 2018-11-04 LAB — PROCALCITONIN: Procalcitonin: <0.1 ng/mL

## 2018-11-04 LAB — MAGNESIUM: Magnesium: 1.8 mg/dL (ref 1.7–2.4)

## 2018-11-04 MED ORDER — TRAZODONE HCL 50 MG PO TABS: 25.0000 mg | ORAL_TABLET | Freq: Every day | ORAL | 0 refills | Status: DC

## 2018-11-04 MED ORDER — QUETIAPINE FUMARATE 25 MG PO TABS: 12.5000 mg | ORAL_TABLET | Freq: Every day | ORAL | Status: DC

## 2018-11-04 MED ORDER — QUETIAPINE FUMARATE 25 MG PO TABS: 12.5000 mg | ORAL_TABLET | Freq: Every day | ORAL | 0 refills | Status: DC

## 2018-11-04 MED ORDER — AMOXICILLIN-POT CLAVULANATE 875-125 MG PO TABS: 1.0000 | ORAL_TABLET | Freq: Two times a day (BID) | ORAL | 0 refills | Status: DC

## 2018-11-04 NOTE — Progress Notes (Signed)
Pt. Very restless and confused all night. Not sleeping. Still trying to climb out of bed.  Undirectable.  Gave Haldol but no relief.  Paged MD.

## 2018-11-04 NOTE — Progress Notes (Signed)
Daily Progress Note   Patient Name: [name] [name]       Date: 11/04/2018 DOB: 1924-07-08  Age: 86 y.o. MRN#: [id number] Attending Physician: Aline August, MD Primary Care Physician: Deland Pretty, MD Admit Date: 11/01/2018  Reason for Consultation/Follow-up: Establishing goals of care  Subjective: Being d/c'd today, agitation overnight, better today  Length of Stay: 3  Current Medications: Scheduled Meds:   amLODipine  10 mg Oral Daily   enoxaparin (LOVENOX) injection  40 mg Subcutaneous Q24H   QUEtiapine  12.5 mg Oral QHS   rosuvastatin  5 mg Oral Daily    Continuous Infusions:  piperacillin-tazobactam (ZOSYN)  IV 3.375 g (11/04/18 0855)    PRN Meds: acetaminophen, haloperidol lactate          Vital Signs: BP (!) 157/62 (BP Location: Left Arm)    Pulse 64    Temp 98.5 F (36.9 C) (Oral)    Resp 16    Ht 5\' 7"  (1.702 m)    Wt 76 kg    SpO2 97%    BMI 26.24 kg/m  SpO2: SpO2: 97 % O2 Device: O2 Device: Room Air O2 Flow Rate:    Intake/output summary:   Intake/Output Summary (Last 24 hours) at 11/04/2018 1220 Last data filed at 11/04/2018 1029 Gross per 24 hour  Intake 564 ml  Output --  Net 564 ml   LBM: Last BM Date: (PTA) Baseline Weight: Weight: 76 kg Most recent weight: Weight: 76 kg       Palliative Assessment/Data: PPS 50%      Patient Active Problem List   Diagnosis Date Noted   Goals of care, counseling/discussion    Palliative care by specialist    Sepsis ([place]) 16/55/3748   Acute metabolic encephalopathy 27/12/8673   Hypokalemia 11/02/2018   Multifocal pneumonia 11/01/2018   Right leg weakness 11/03/2017   Bleeding internal hemorrhoids 10/28/2017   Chronic diastolic heart failure ([place]) 08/17/2013   Femur fracture, left ([facility])  08/16/2013   Bradycardia, severe sinus 10/30/2011   Personal history of colonic adenoma 01/09/2011   Family history of malignant neoplasm of colon cancer-mother 01/09/2011   Coronary artery disease    Hyperlipidemia    Hypertension    Headache(784.0)     Palliative Care Assessment & Plan   HPI: 85 y.o. male  with past medical history of amnesia, BPH, bradycardia, CAD, colon polyps, HTN, HLD, mitral valve regurgitation, and CHF admitted on 11/01/2018 after he was found down at his independent living facility. He is being treated for sepsis secondary to multifocal pna with IV antibiotics. PMT consulted for [place].  Assessment: Received phone call this AM from patient's daughter [name] requesting a return call. [name] expressed concerns about her father's medications. These issues have been resolved. Provided clinical update. [name] understands he is being discharged today and is grateful for update.  Recommendations/Plan:  Palliative to follow at SNF  Code status changed to DNR, signed DNR on charg  Code Status:  DNR  Prognosis:   Unable to determine  Discharge Planning:  [place] for rehab with Palliative care service follow-up  Care plan was discussed with palliative RN and daughter [name]  Thank you for allowing the Palliative  Medicine Team to assist in the care of this patient.   Total Time 25 minutes Prolonged Time Billed  no    The above conversation was completed via telephone due to the visitor restrictions during the COVID-19 pandemic. Thorough chart review and discussion with necessary members of the care team was completed as part of assessment. All issues were discussed and addressed but no physical exam was performed.      Greater than 50%  of this time was spent counseling and coordinating care related to the above assessment and plan.  Juel Burrow, DNP, [facility] Palliative Medicine Team Team Phone # [phone]  Pager  [phone]

## 2018-11-04 NOTE — TOC Transition Note (Signed)
Transition of Care [facility]) - CM/SW Discharge Note   Patient Details  Name: [name] [name] MRN: [id number] Date of Birth: May 05, 1925  Transition of Care [facility]) CM/SW Contact:  Benard Halsted, LCSW Phone Number: 11/04/2018, 12:03 PM   Clinical Narrative:    Patient will DC to: [facility] Anticipated DC date: 11/04/18 Family notified: Daughter, [name] Transport by: Corey Harold   Per MD patient ready for DC to [facility]. RN, patient, patient's family, and facility notified of DC. Discharge Summary and FL2 sent to facility. RN to call report prior to discharge [phone] Room 926). DC packet on chart. Ambulance transport requested for patient.   CSW will sign off for now as social work intervention is no longer needed. Please consult [place] again if new needs arise.  Cedric Fishman, LCSW Clinical Social Worker [phone]    Final next level of care: Skilled Nursing Facility Barriers to Discharge: No Barriers Identified   Patient Goals and CMS Choice Patient states their goals for this hospitalization and ongoing recovery are:: " pt to return to ILF" CMS Medicare.gov Compare Post Acute Care list provided to:: (N/A) Choice offered to / list presented to : NA  Discharge Placement   Existing PASRR number confirmed : 11/04/18          Patient chooses bed at: [facility] Patient to be transferred to facility by: [facility] Name of family member notified: POA, daughter, [name] Patient and family notified of of transfer: 11/04/18  Discharge Plan and Services In-house Referral: Clinical Social Work Discharge Planning Services: NA Post Acute Care Choice: [facility] [place]          DME Arranged: N/A DME Agency: NA       HH Arranged: NA HH Agency: NA        Social Determinants of Health (SDOH) Interventions     Readmission Risk Interventions No flowsheet data found.

## 2018-11-04 NOTE — Progress Notes (Signed)
MD on call ordered non-violent restraints.  There is a sitter present I don't believe restraints are indicated at this time.  I am not going to apply restraints at this time.  I will report off to oncoming nurse and leave order just in case restraints need to be applied.

## 2018-11-04 NOTE — Discharge Summary (Addendum)
Physician Discharge Summary  [name] [name] VFI:[id number] DOB: August 03, 1924 DOA: 11/01/2018  PCP: Deland Pretty, MD  Admit date: 11/01/2018 Discharge date: 11/04/2018  [facility] From[facility] Disposition: SNF  Recommendations for Outpatient Follow-up:  1. Follow up with SNF provider at earliest convenience 2. Outpatient evaluation and follow-up by palliative care team 3. Follow up in ED if symptoms worsen or new appear   Home Health: No Equipment/Devices: None  Discharge Condition: Guarded to poor CODE STATUS: DNR Diet recommendation: Heart healthy/as per SLP recommendations  Brief/Interim Summary: 85 year old male with history of amnesia, BPH, bradycardia, coronary artery disease, colon polyps, hypertension, hyperlipidemia, mitral valve regurgitation, diastolic congestive heart failure, reflux esophagitis presented on 11/01/2018 from his nursing home for altered mental status.  He was nonverbal on presentation but at baseline, he is verbal and able to ambulate apparently.  He was admitted with sepsis secondary to multifocal pneumonia and acute metabolic encephalopathy and was started on IV antibiotics.  Given the hospitalization, his respiratory status improved and has remained stable.  Cultures have been negative so far.  He has had increased intermittent confusion and agitation.  PT recommended SNF placement.  Palliative care was also consulted.  He has been discharged to SNF on oral antibiotics.  Discharge Diagnoses:  Principal Problem:   Multifocal pneumonia Active Problems:   Hypertension   Sepsis ([place])   Acute metabolic encephalopathy   Hypokalemia   Goals of care, counseling/discussion   Palliative care by specialist  Sepsis: Present on admission -Secondary to multifocal pneumonia -COVID-19 test was negative -Chest x-ray on admission showed patchy opacities in bilateral lungs, concerning for developing infection versus atelectasis -Antibiotics plan as below.   Cultures negative so far -Sepsis has resolved  Multifocal pneumonia -Currently on Zosyn.  MRSA PCR was negative -Cultures negative so far.  Currently on room air. -Procalcitonin was less than 0.1 -Diet as per SLP recommendation.    Will switch to oral Augmentin for 3 more days -Discharge patient to SNF today.    Leukocytosis -Resolved  Acute metabolic encephalopathy in a patient with probable dementia -Ammonia level was normal.  Head CT showed persistent vasogenic edema in the left temporal lobe adjacent to the known meningioma, not felt to be appreciably changed in size or contour -No evidence of seizures -B12 level normal.  TSH normal -Patient has had waxing and waning mental status with increased sundowning.  He has had increased restlessness and agitation overnight.  He had to receive Haldol as well.  He has not gotten much sleep at night and maybe that is making matters worse.  After discussion with patient's daughter [name] [name] on phone today, it was agreed that patient will be started on low-dose trazodone to help with sleep.  The dose can be increased at SNF if needed.  Family did not want to try Seroquel. -If condition does not improve and progressively worsens, consider hospice/comfort measures.  Follow palliative care as an outpatient.  Fall with questionable syncope -Patient was down on the floor for an unknown period of time at his nursing home.  Unclear if he had a syncopal event -No fall since admission. -PT recommends SNF placement. -IV fluids discontinued. -Echo showed EF of more than 65% with impaired diastolic relaxation. -Troponins did not trend up.  No chest pain.  Hypokalemia -Improved with replacement  Chronic diastolic heart failure -Stable.  Monitor.  Hypertension  -continue amlodipine.  Resume lisinopril/hydrochlorothiazide  Hyperlipidemia -Continue Crestor  Discharge Instructions  Discharge Instructions    Diet - low sodium  heart healthy    Complete by:  As directed    Increase activity slowly   Complete by:  As directed      Allergies as of 11/04/2018   No Known Allergies     Medication List    TAKE these medications   acetaminophen 500 MG tablet Commonly known as:  TYLENOL Take 500 mg by mouth as needed.   amLODipine 10 MG tablet Commonly known as:  NORVASC Take 1 tablet (10 mg total) by mouth daily.   amoxicillin-clavulanate 875-125 MG tablet Commonly known as:  Augmentin Take 1 tablet by mouth 2 (two) times daily for 3 days.   furosemide 40 MG tablet Commonly known as:  LASIX Take 40 mg by mouth 2 (two) times daily as needed.   lisinopril-hydrochlorothiazide 20-25 MG tablet Commonly known as:  ZESTORETIC Take 1 tablet by mouth daily.   multivitamin tablet Take 1 tablet by mouth daily.   NIACIN PO Take 500 mg by mouth daily. Mon, tues, thurs sat   OVER THE COUNTER MEDICATION Take 1 capsule by mouth daily. Vitamin D & Fish Oil in one large capsule   rosuvastatin 5 MG tablet Commonly known as:  CRESTOR Take 5 mg by mouth. Takes rarely   traZODone 50 MG tablet Commonly known as:  DESYREL Take 0.5 tablets (25 mg total) by mouth at bedtime.   vitamin B-12 1000 MCG tablet Commonly known as:  CYANOCOBALAMIN Take 1,000 mcg by mouth daily.   zinc gluconate 50 MG tablet Take 100 mg by mouth 3 (three) times a week. Take on Mon wed and fridays       Follow-up Information    Deland Pretty, MD. Schedule an appointment as soon as possible for a visit in 1 week(s).   Specialty:  Internal Medicine Contact information: [address] [place] [address] [phone]        Palliative care Follow up.   Why:  At earliest convenience         No Known Allergies  Consultations:  Palliative care   Procedures/Studies: Dg Chest 2 View  Result Date: 11/01/2018 CLINICAL DATA:  85 year old male with altered mental status EXAM: CHEST - 2 VIEW COMPARISON:  10/07/2016 FINDINGS:  Cardiomediastinal silhouette unchanged in size and contour. No evidence of central vascular congestion. No pneumothorax. No pleural effusion. Patchy opacities at the bilateral lung bases. No displaced fracture. Degenerative changes of the spine IMPRESSION: Patchy opacities bilateral lungs, potentially developing infection and/or atelectasis. Electronically Signed   By: Corrie Mckusick D.O.   On: 11/01/2018 14:53   Ct Head Wo Contrast  Result Date: 11/01/2018 CLINICAL DATA:  Altered mental status and decreased responsiveness EXAM: CT HEAD WITHOUT CONTRAST TECHNIQUE: Contiguous axial images were obtained from the base of the skull through the vertex without intravenous contrast. COMPARISON:  Brain MRI July 21, 2017; head CT August 08, 2018 FINDINGS: Brain: Mild diffuse atrophy is stable. There remains edema throughout much of the inferior left temporal lobe. A known meningioma arising in the medial left temporal lobe is better appreciated on prior MR. There is subtle increased attenuation in the area of the known meningioma, stable, measuring 2.2 x 0.9 cm. No similar appearing edema elsewhere. No other mass evident. There is no hemorrhage, extra-axial fluid collection, or midline shift. There is patchy small vessel disease in the centra semiovale bilaterally. No acute infarct is demonstrable on this study. Vascular: There is no appreciable hyperdense vessel. There is calcification in the distal left vertebral artery as  well as in both carotid siphon regions. Skull: The bony calvarium appears intact. Sinuses/Orbits: There is mucosal thickening in several ethmoid air cells bilaterally. Other visualized paranasal sinuses are clear. Visualized orbits appear symmetric bilaterally. Other: Mastoid air cells are clear. IMPRESSION: Persistent vasogenic edema in the left temporal lobe adjacent to a known meningioma, not felt to be appreciably changed in size or contour. No new edema evident. No hemorrhage. There is  underlying atrophy with periventricular small vessel disease. No evident acute infarct. There are foci of arterial vascular calcification. There is mucosal thickening in several ethmoid air cells bilaterally. Electronically Signed   By: Lowella Grip III M.D.   On: 11/01/2018 15:34    Echo IMPRESSIONS    1. The left ventricle has hyperdynamic systolic function, with an ejection fraction of >65%. The cavity size was normal. Left ventricular diastolic Doppler parameters are consistent with impaired relaxation. Indeterminate filling pressures.  2. The right ventricle has normal systolic function. The cavity was normal. There is no increase in right ventricular wall thickness.  3. Left atrial size was mildly dilated.  4. Right atrial size was mildly dilated.  5. The aortic valve is tricuspid. Mild thickening of the aortic valve. Mild calcification of the aortic valve. No stenosis of the aortic valve.   Subjective: Patient seen and examined at bedside.  He is sitting on chair and is pleasantly confused.  As per nursing staff, he was intermittently agitated overnight and hardly had any sleep.  No overnight fever or vomiting.  Discharge Exam: Vitals:   11/03/18 2253 11/04/18 0603  BP: (!) 145/69 (!) 157/62  Pulse: 66 64  Resp: 16 16  Temp: 98.3 F (36.8 C) 98.5 F (36.9 C)  SpO2: 96% 97%    General: Pt is awake but pleasantly confused.   Cardiovascular: rate controlled, S1/S2 + Respiratory: bilateral decreased breath sounds at bases with some scattered crackles Abdominal: Soft, NT, ND, bowel sounds + Extremities: no edema, no cyanosis    The results of significant diagnostics from this hospitalization (including imaging, microbiology, ancillary and laboratory) are listed below for reference.     Microbiology: Recent Results (from the past 240 hour(s))  SARS Coronavirus 2 (CEPHEID- Performed in [facility] hospital lab), Hosp Order     Status: None   Collection Time: 11/01/18   4:49 PM  Result Value Ref Range Status   SARS Coronavirus 2 NEGATIVE NEGATIVE Final    Comment: (NOTE) If result is NEGATIVE SARS-CoV-2 target nucleic acids are NOT DETECTED. The SARS-CoV-2 RNA is generally detectable in upper and lower  respiratory specimens during the acute phase of infection. The lowest  concentration of SARS-CoV-2 viral copies this assay can detect is 250  copies / mL. A negative result does not preclude SARS-CoV-2 infection  and should not be used as the sole basis for treatment or other  patient management decisions.  A negative result may occur with  improper specimen collection / handling, submission of specimen other  than nasopharyngeal swab, presence of viral mutation(s) within the  areas targeted by this assay, and inadequate number of viral copies  (<250 copies / mL). A negative result must be combined with clinical  observations, patient history, and epidemiological information. If result is POSITIVE SARS-CoV-2 target nucleic acids are DETECTED. The SARS-CoV-2 RNA is generally detectable in upper and lower  respiratory specimens dur ing the acute phase of infection.  Positive  results are indicative of active infection with SARS-CoV-2.  Clinical  correlation with patient history  and other diagnostic information is  necessary to determine patient infection status.  Positive results do  not rule out bacterial infection or co-infection with other viruses. If result is PRESUMPTIVE POSTIVE SARS-CoV-2 nucleic acids MAY BE PRESENT.   A presumptive positive result was obtained on the submitted specimen  and confirmed on repeat testing.  While 2019 novel coronavirus  (SARS-CoV-2) nucleic acids may be present in the submitted sample  additional confirmatory testing may be necessary for epidemiological  and / or clinical management purposes  to differentiate between  SARS-CoV-2 and other Sarbecovirus currently known to infect humans.  If clinically indicated  additional testing with an alternate test  methodology [phone]) is advised. The SARS-CoV-2 RNA is generally  detectable in upper and lower respiratory sp ecimens during the acute  phase of infection. The expected result is Negative. Fact Sheet for Patients:  StrictlyIdeas.no Fact Sheet for Healthcare Providers: BankingDealers.co.za This test is not yet approved or cleared by the [place] FDA and has been authorized for detection and/or diagnosis of SARS-CoV-2 by FDA under an Emergency Use Authorization (EUA).  This EUA will remain in effect (meaning this test can be used) for the duration of the COVID-19 declaration under Section 564(b)(1) of the Act, 21 U.S.C. section 360bbb-3(b)(1), unless the authorization is terminated or revoked sooner. Performed at [facility], [place] [address] [place], [place] [address]   Blood culture (routine x 2)     Status: None (Preliminary result)   Collection Time: 11/01/18  4:57 PM  Result Value Ref Range Status   Specimen Description BLOOD LEFT ANTECUBITAL  Final   Special Requests   Final    BOTTLES DRAWN AEROBIC AND ANAEROBIC Blood Culture adequate volume   Culture   Final    NO GROWTH 2 DAYS Performed at [facility], [place] [address], [place], [place] [address]    Report Status PENDING  Incomplete  Blood culture (routine x 2)     Status: None (Preliminary result)   Collection Time: 11/01/18  4:57 PM  Result Value Ref Range Status   Specimen Description BLOOD RIGHT ANTECUBITAL  Final   Special Requests   Final    BOTTLES DRAWN AEROBIC AND ANAEROBIC Blood Culture results may not be optimal due to an inadequate volume of blood received in culture bottles   Culture   Final    NO GROWTH 2 DAYS Performed at [facility], [place] [address], [place], [place] [address]    Report Status PENDING  Incomplete  MRSA PCR Screening     Status: None   Collection Time: 11/02/18  5:47 AM   Result Value Ref Range Status   MRSA by PCR NEGATIVE NEGATIVE Final    Comment:        The GeneXpert MRSA Assay (FDA approved for NASAL specimens only), is one component of a comprehensive MRSA colonization surveillance program. It is not intended to diagnose MRSA infection nor to guide or monitor treatment for MRSA infections. Performed at [facility], [place] [address] [place], [place] [address]      Labs: BNP (last 3 results) Recent Labs    11/01/18 2348  BNP 106.2*   Basic Metabolic Panel: Recent Labs  Lab 11/01/18 1405 11/01/18 2348 11/02/18 0222 11/03/18 0337 11/04/18 0312  NA 136  --  134* 136 136  K 3.4*  --  3.3* 3.7 3.3*  CL 97*  --  99 101 103  CO2 25  --  24  24 23  GLUCOSE 129*  --  114* 122* 107*  BUN 13  --  10 13 9   CREATININE 1.03  --  0.87 0.98 0.94  CALCIUM 9.5  --  8.7* 9.2 9.1  MG  --  1.8  --   --  1.8   Liver Function Tests: Recent Labs  Lab 11/01/18 1624 11/04/18 0312  AST 31 26  ALT 24 22  ALKPHOS 64 48  BILITOT 1.2 1.8*  PROT 6.6 5.8*  ALBUMIN 4.1 3.5   No results for input(s): LIPASE, AMYLASE in the last 168 hours. Recent Labs  Lab 11/01/18 1624  AMMONIA 22   CBC: Recent Labs  Lab 11/01/18 1405 11/02/18 0222 11/03/18 0337 11/04/18 0312  WBC 16.9* 12.8* 10.8* 9.0  NEUTROABS 15.0*  --   --  6.0  HGB 14.9 12.9* 13.0 12.8*  HCT 42.9 36.7* 37.3* 36.8*  MCV 94.3 93.6 93.3 92.5  PLT 223 192 202 186   Cardiac Enzymes: Recent Labs  Lab 11/01/18 1405 11/01/18 1936 11/02/18 0222 11/02/18 0827  CKTOTAL 343  --   --   --   TROPONINI  --  0.06* 0.05* 0.04*   BNP: Invalid input(s): POCBNP CBG: No results for input(s): GLUCAP in the last 168 hours. D-Dimer No results for input(s): DDIMER in the last 72 hours. Hgb A1c No results for input(s): HGBA1C in the last 72 hours. Lipid Profile No results for input(s): CHOL, HDL, LDLCALC, TRIG, CHOLHDL, LDLDIRECT in the last 72 hours. Thyroid function studies Recent  Labs    11/01/18 2348  TSH 1.504   Anemia work up Recent Labs    11/01/18 2348  VITAMINB12 1,261*   Urinalysis    Component Value Date/Time   COLORURINE YELLOW 11/01/2018 1400   APPEARANCEUR CLEAR 11/01/2018 1400   LABSPEC 1.014 11/01/2018 1400   PHURINE 7.0 11/01/2018 1400   GLUCOSEU NEGATIVE 11/01/2018 1400   HGBUR NEGATIVE 11/01/2018 1400   BILIRUBINUR NEGATIVE 11/01/2018 1400   KETONESUR 20 (A) 11/01/2018 1400   PROTEINUR NEGATIVE 11/01/2018 1400   UROBILINOGEN 0.2 08/17/2013 0105   NITRITE NEGATIVE 11/01/2018 1400   LEUKOCYTESUR NEGATIVE 11/01/2018 1400   Sepsis Labs Invalid input(s): PROCALCITONIN,  WBC,  LACTICIDVEN Microbiology Recent Results (from the past 240 hour(s))  SARS Coronavirus 2 (CEPHEID- Performed in [facility] hospital lab), Hosp Order     Status: None   Collection Time: 11/01/18  4:49 PM  Result Value Ref Range Status   SARS Coronavirus 2 NEGATIVE NEGATIVE Final    Comment: (NOTE) If result is NEGATIVE SARS-CoV-2 target nucleic acids are NOT DETECTED. The SARS-CoV-2 RNA is generally detectable in upper and lower  respiratory specimens during the acute phase of infection. The lowest  concentration of SARS-CoV-2 viral copies this assay can detect is 250  copies / mL. A negative result does not preclude SARS-CoV-2 infection  and should not be used as the sole basis for treatment or other  patient management decisions.  A negative result may occur with  improper specimen collection / handling, submission of specimen other  than nasopharyngeal swab, presence of viral mutation(s) within the  areas targeted by this assay, and inadequate number of viral copies  (<250 copies / mL). A negative result must be combined with clinical  observations, patient history, and epidemiological information. If result is POSITIVE SARS-CoV-2 target nucleic acids are DETECTED. The SARS-CoV-2 RNA is generally detectable in upper and lower  respiratory specimens  dur ing the acute phase of infection.  Positive  results are indicative of active infection with SARS-CoV-2.  Clinical  correlation with patient history and other diagnostic information is  necessary to determine patient infection status.  Positive results do  not rule out bacterial infection or co-infection with other viruses. If result is PRESUMPTIVE POSTIVE SARS-CoV-2 nucleic acids MAY BE PRESENT.   A presumptive positive result was obtained on the submitted specimen  and confirmed on repeat testing.  While 2019 novel coronavirus  (SARS-CoV-2) nucleic acids may be present in the submitted sample  additional confirmatory testing may be necessary for epidemiological  and / or clinical management purposes  to differentiate between  SARS-CoV-2 and other Sarbecovirus currently known to infect humans.  If clinically indicated additional testing with an alternate test  methodology [phone]) is advised. The SARS-CoV-2 RNA is generally  detectable in upper and lower respiratory sp ecimens during the acute  phase of infection. The expected result is Negative. Fact Sheet for Patients:  StrictlyIdeas.no Fact Sheet for Healthcare Providers: BankingDealers.co.za This test is not yet approved or cleared by the [place] FDA and has been authorized for detection and/or diagnosis of SARS-CoV-2 by FDA under an Emergency Use Authorization (EUA).  This EUA will remain in effect (meaning this test can be used) for the duration of the COVID-19 declaration under Section 564(b)(1) of the Act, 21 U.S.C. section 360bbb-3(b)(1), unless the authorization is terminated or revoked sooner. Performed at [facility], [place] [address], [place], [place] [address]   Blood culture (routine x 2)     Status: None (Preliminary result)   Collection Time: 11/01/18  4:57 PM  Result Value Ref Range Status   Specimen Description BLOOD LEFT ANTECUBITAL  Final    Special Requests   Final    BOTTLES DRAWN AEROBIC AND ANAEROBIC Blood Culture adequate volume   Culture   Final    NO GROWTH 2 DAYS Performed at [facility], [place] [address], [place], [place] [address]    Report Status PENDING  Incomplete  Blood culture (routine x 2)     Status: None (Preliminary result)   Collection Time: 11/01/18  4:57 PM  Result Value Ref Range Status   Specimen Description BLOOD RIGHT ANTECUBITAL  Final   Special Requests   Final    BOTTLES DRAWN AEROBIC AND ANAEROBIC Blood Culture results may not be optimal due to an inadequate volume of blood received in culture bottles   Culture   Final    NO GROWTH 2 DAYS Performed at [facility], [place] [address], [place], [place] [address]    Report Status PENDING  Incomplete  MRSA PCR Screening     Status: None   Collection Time: 11/02/18  5:47 AM  Result Value Ref Range Status   MRSA by PCR NEGATIVE NEGATIVE Final    Comment:        The GeneXpert MRSA Assay (FDA approved for NASAL specimens only), is one component of a comprehensive MRSA colonization surveillance program. It is not intended to diagnose MRSA infection nor to guide or monitor treatment for MRSA infections. Performed at [facility], [place] [address] [place], [place] [address]      Time coordinating discharge: 35 minutes  SIGNED:   Aline August, MD  [facility] 11/04/2018, 10:23 AM

## 2018-11-04 NOTE — Progress Notes (Signed)
Pt prepared for d/c to SNF. IV d/c'd. Skin intact except as charted in most recent assessments. Vitals are stable. Report called to receiving facility. Pt to be transported by ambulance service. 

## 2018-11-05 ENCOUNTER — Encounter: Payer: Self-pay | Admitting: Internal Medicine

## 2018-11-05 ENCOUNTER — Non-Acute Institutional Stay (SKILLED_NURSING_FACILITY): Payer: Medicare HMO | Admitting: Internal Medicine

## 2018-11-05 DIAGNOSIS — G9341 Metabolic encephalopathy: Secondary | ICD-10-CM | POA: Diagnosis not present

## 2018-11-05 DIAGNOSIS — I5032 Chronic diastolic (congestive) heart failure: Secondary | ICD-10-CM

## 2018-11-05 DIAGNOSIS — E785 Hyperlipidemia, unspecified: Secondary | ICD-10-CM | POA: Diagnosis not present

## 2018-11-05 DIAGNOSIS — I10 Essential (primary) hypertension: Secondary | ICD-10-CM

## 2018-11-05 DIAGNOSIS — J189 Pneumonia, unspecified organism: Secondary | ICD-10-CM

## 2018-11-05 NOTE — Progress Notes (Signed)
Provider:   Location:  [facility] Room Number: 324 Place of Service:  SNF (31)  PCP: Deland Pretty, MD Patient Care Team: Deland Pretty, MD as PCP - General (Internal Medicine) Gatha Mayer, MD as Consulting Physician (Gastroenterology) [name], [name], MD as Consulting Physician (Cardiology)  Extended Emergency Contact Information Primary Emergency Contact: Maraj,[name] Address: [address]          [place], [place] [address] [name] Phone: [phone] Mobile Phone: [phone] Relation: Daughter Secondary Emergency Contact: Laur,[name] Address: [place] [address] [place] Mobile Phone: [phone] Relation: Daughter  Code Status:  Goals of Care: Advanced Directive information Advanced Directives 11/05/2018  Does Patient Have a Medical Advance Directive? Yes  Type of Advance Directive Living will  Does patient want to make changes to medical advance directive? No - Patient declined  Copy of Declo in Chart? -  Pre-existing out of facility DNR order (yellow form or pink MOST form) -      Chief Complaint  Patient presents with  . New Admit To SNF    new admit to facility     HPI: Patient is a 85 y.o. male seen today for admission to SNF for Therapy  Patient was admitted to the [facility] from 05/18-05/21 for sepsis due to multifocal pneumonia and acute metabolic encephalopathy. Patient has a history of BPH, bradycardia, CAD, hypertension, diastolic CHF, GERD,H/o Left Sphenoid Meningioma Patient lives by  himself in [address] apartment.  He does not remember but states that he fell and was found by the nursing staff in his apartment on the floor.  Patient states he had not had any fever cough or shortness of breath before this fall. The hospital patient was found to be septic secondary to multifocal pneumonia.  Covid test was negative.  He was treated  with broad-spectrum antibiotics.  He was switched over  to oral Antibiotics. His CT scan showed some edema in the left temporal lobe adjacent to known meningioma with no acute changes. His B12 and TSH was normal.  He did continue to have some sundowning and needed some Haldol.  Patient's family wanted him to be on trazodone and Not Seroquel Echo showed EF of 60% with diastolic dysfunction. At baseline patient is independent and walks with a walker He was upset today as he is to be in isolation for 2 weeks and cannot go back to his apartment.  Though he is working with the therapy and is already able to get up and walk to the bathroom with the walker.  He is very hard of hearing but denied any cough or shortness of breath.  He is Planning to  go back to his apartment Past Medical History:  Diagnosis Date  . Allergic rhinitis   . Amnesia   . BPH (benign prostatic hyperplasia)   . Bradycardia   . Colon polyps   . Coronary artery disease    MODERATE  . Dizziness   . Edema of lower extremity   . Erectile dysfunction   . H. pylori infection    Hx of   . Hard of hearing   . Headache(784.0)   . History of colon polyps 2009   Dr. Orr/Colonoscopy -small cecal adenoma  . Hx of colonoscopy 2009   Dr Lajoyce Corners, hemorrhoids & polyps  . Hyperlipidemia   . Hypertension   . Internal hemorrhoids 2009  Dr. Lajoyce Corners Colonoscopy  . Mitral valve regurgitation   . Reflux esophagitis    Past Surgical History:  Procedure Laterality Date  . APPENDECTOMY    . CARDIAC CATHETERIZATION  1997   REVEALED MILD TO MODERATE IRREGULARITIES. HIS LEFT EVNTRICULAR SYSTOLIC FUNCTION REVEALED NORMAL EJECTION FRACTION WITH EF OF 70%  . CATARACT EXTRACTION, BILATERAL    . COLONOSCOPY  multiple  . fatty tumor removal     benign  . HEMORROIDECTOMY    . HIP ARTHROPLASTY Left 08/17/2013   Procedure: ARTHROPLASTY BIPOLAR HIP;  Surgeon: Gearlean Alf, MD;  Location: [facility];  Service: Orthopedics;  Laterality: Left;  . INGUINAL  HERNIA REPAIR Bilateral 2006  . lower back surgery  2006  . NASAL SEPTUM SURGERY    . TONSILLECTOMY    . UPPER GASTROINTESTINAL ENDOSCOPY      reports that he quit smoking about 43 years ago. He has never used smokeless tobacco. He reports current alcohol use. He reports that he does not use drugs. Social History   Socioeconomic History  . Marital status: Widowed    Spouse name: Not on file  . Number of children: 3  . Years of education: Not on file  . Highest education level: Not on file  Occupational History  . Not on file  Social Needs  . Financial resource strain: Not on file  . Food insecurity:    Worry: Not on file    Inability: Not on file  . Transportation needs:    Medical: Not on file    Non-medical: Not on file  Tobacco Use  . Smoking status: Former Smoker    Last attempt to quit: 10/25/1975    Years since quitting: 43.0  . Smokeless tobacco: Never Used  Substance and Sexual Activity  . Alcohol use: Yes    Comment: occ 6-7 times a year  . Drug use: No  . Sexual activity: Not on file  Lifestyle  . Physical activity:    Days per week: Not on file    Minutes per session: Not on file  . Stress: Not on file  Relationships  . Social connections:    Talks on phone: Not on file    Gets together: Not on file    Attends religious service: Not on file    Active member of club or organization: Not on file    Attends meetings of clubs or organizations: Not on file    Relationship status: Not on file  . Intimate partner violence:    Fear of current or ex partner: Not on file    Emotionally abused: Not on file    Physically abused: Not on file    Forced sexual activity: Not on file  Other Topics Concern  . Not on file  Social History Narrative   Retired and widowed   Lives at [facility]   Right handed    Functional Status Survey:    Family History  Problem Relation Age of Onset  . Colon cancer Mother 71       second cancer in 54's deceased  after emergency surgery  . Breast cancer Sister     Health Maintenance  Topic Date Due  . Samul Dada  05/07/1944  . INFLUENZA VACCINE  01/15/2019  . PNA vac Low Risk Adult  Completed    No Known Allergies  Outpatient Encounter Medications as of 11/05/2018  Medication Sig  . acetaminophen (TYLENOL) 500 MG tablet Take 500 mg by mouth every 8 (eight)  hours as needed.   Marland Kitchen amLODipine (NORVASC) 10 MG tablet Take 1 tablet (10 mg total) by mouth daily.  Marland Kitchen amoxicillin-clavulanate (AUGMENTIN) 875-125 MG tablet Take 1 tablet by mouth 2 (two) times daily for 3 days.  . Cholecalciferol 25 MCG (1000 UT) capsule Take 1,000 Units by mouth daily.  Marland Kitchen doxycycline (VIBRA-TABS) 100 MG tablet Take 100 mg by mouth 2 (two) times daily.  . furosemide (LASIX) 40 MG tablet Take 40 mg by mouth 2 (two) times daily as needed.   Marland Kitchen lisinopril-hydrochlorothiazide (PRINZIDE,ZESTORETIC) 20-25 MG tablet Take 1 tablet by mouth daily.  . Multiple Vitamin (MULTIVITAMIN) tablet Take 1 tablet by mouth daily.    Marland Kitchen NIACIN PO Take 500 mg by mouth daily. Mon, tues, thurs sat  . Omega-3 Fatty Acids (FISH OIL) 1000 MG CAPS Take 1 capsule by mouth daily.  . rosuvastatin (CRESTOR) 5 MG tablet Take 5 mg by mouth. Takes rarely  . saccharomyces boulardii (FLORASTOR) 250 MG capsule Take 250 mg by mouth 2 (two) times daily.  . traZODone (DESYREL) 50 MG tablet Take 0.5 tablets (25 mg total) by mouth at bedtime.  . vitamin B-12 (CYANOCOBALAMIN) 1000 MCG tablet Take 1,000 mcg by mouth daily.  Marland Kitchen zinc gluconate 50 MG tablet Take 100 mg by mouth 3 (three) times a week. Take on Mon wed and fridays  . OVER THE COUNTER MEDICATION Take 1 capsule by mouth daily. Vitamin D & Fish Oil in one large capsule   No facility-administered encounter medications on file as of 11/05/2018.     Review of Systems Review of Systems  Constitutional: Negative for activity change, appetite change, chills, diaphoresis, fatigue and fever.  HENT: Negative for  mouth sores, postnasal drip, rhinorrhea, sinus pain and sore throat.   Respiratory: Negative for apnea, cough, chest tightness, shortness of breath and wheezing.   Cardiovascular: Negative for chest pain, palpitations and Positive  leg swelling.  Gastrointestinal: Negative for abdominal distention, abdominal pain, constipation, diarrhea, nausea and vomiting.  Genitourinary: Negative for dysuria and frequency.  Musculoskeletal: Negative for arthralgias, joint swelling and myalgias.  Skin: Negative for rash.  Neurological: Negative for dizziness, syncope, weakness, light-headedness and numbness.  Psychiatric/Behavioral: Negative for behavioral problems, confusion and sleep disturbance.     Vitals:   11/05/18 1542  BP: 138/64  Pulse: 77  Resp: 20  Temp: 99.1 F (37.3 C)  SpO2: 96%  Weight: 160 lb (72.6 kg)   Body mass index is 25.06 kg/m. Physical Exam Constitutional: Oriented to person, place, and time. Well-developed and well-nourished.  HENT:  Head: Normocephalic.  Mouth/Throat: Oropharynx is clear and moist.  Eyes: Pupils are equal, round, and reactive to light.  Neck: Neck supple.  Cardiovascular: Normal rate and normal heart sounds.  No murmur heard. Pulmonary/Chest: Effort normal and breath sounds normal. No respiratory distress. No wheezes. She has no rales.  Abdominal: Soft. Bowel sounds are normal. No distension. There is no tenderness. There is no rebound.  Musculoskeletal: Has Mild edema Left More then Right Lymphadenopathy: none Neurological: Alert and oriented to person, place, and time.  Skin: Skin is warm and dry.  Psychiatric: Normal mood and affect. Behavior is normal. Thought content normal.   Labs reviewed: Basic Metabolic Panel: Recent Labs    11/01/18 2348 11/02/18 0222 11/03/18 0337 11/04/18 0312  NA  --  134* 136 136  K  --  3.3* 3.7 3.3*  CL  --  99 101 103  CO2  --  24 24 23   GLUCOSE  --  114* 122* 107*  BUN  --  10 13 9   CREATININE  --   0.87 0.98 0.94  CALCIUM  --  8.7* 9.2 9.1  MG 1.8  --   --  1.8   Liver Function Tests: Recent Labs    11/01/18 1624 11/04/18 0312  AST 31 26  ALT 24 22  ALKPHOS 64 48  BILITOT 1.2 1.8*  PROT 6.6 5.8*  ALBUMIN 4.1 3.5   No results for input(s): LIPASE, AMYLASE in the last 8760 hours. Recent Labs    11/01/18 1624  AMMONIA 22   CBC: Recent Labs    11/01/18 1405 11/02/18 0222 11/03/18 0337 11/04/18 0312  WBC 16.9* 12.8* 10.8* 9.0  NEUTROABS 15.0*  --   --  6.0  HGB 14.9 12.9* 13.0 12.8*  HCT 42.9 36.7* 37.3* 36.8*  MCV 94.3 93.6 93.3 92.5  PLT 223 192 202 186   Cardiac Enzymes: Recent Labs    11/01/18 1405 11/01/18 1936 11/02/18 0222 11/02/18 0827  CKTOTAL 343  --   --   --   TROPONINI  --  0.06* 0.05* 0.04*   BNP: Invalid input(s): POCBNP No results found for: HGBA1C Lab Results  Component Value Date   TSH 1.504 11/01/2018   Lab Results  Component Value Date   VITAMINB12 1,261 (H) 11/01/2018   No results found for: FOLATE No results found for: IRON, TIBC, FERRITIN  Imaging and Procedures obtained prior to SNF admission: Dg Chest 2 View  Result Date: 11/01/2018 CLINICAL DATA:  85 year old male with altered mental status EXAM: CHEST - 2 VIEW COMPARISON:  10/07/2016 FINDINGS: Cardiomediastinal silhouette unchanged in size and contour. No evidence of central vascular congestion. No pneumothorax. No pleural effusion. Patchy opacities at the bilateral lung bases. No displaced fracture. Degenerative changes of the spine IMPRESSION: Patchy opacities bilateral lungs, potentially developing infection and/or atelectasis. Electronically Signed   By: Corrie Mckusick D.O.   On: 11/01/2018 14:53   Ct Head Wo Contrast  Result Date: 11/01/2018 CLINICAL DATA:  Altered mental status and decreased responsiveness EXAM: CT HEAD WITHOUT CONTRAST TECHNIQUE: Contiguous axial images were obtained from the base of the skull through the vertex without intravenous contrast.  COMPARISON:  Brain MRI July 21, 2017; head CT August 08, 2018 FINDINGS: Brain: Mild diffuse atrophy is stable. There remains edema throughout much of the inferior left temporal lobe. A known meningioma arising in the medial left temporal lobe is better appreciated on prior MR. There is subtle increased attenuation in the area of the known meningioma, stable, measuring 2.2 x 0.9 cm. No similar appearing edema elsewhere. No other mass evident. There is no hemorrhage, extra-axial fluid collection, or midline shift. There is patchy small vessel disease in the centra semiovale bilaterally. No acute infarct is demonstrable on this study. Vascular: There is no appreciable hyperdense vessel. There is calcification in the distal left vertebral artery as well as in both carotid siphon regions. Skull: The bony calvarium appears intact. Sinuses/Orbits: There is mucosal thickening in several ethmoid air cells bilaterally. Other visualized paranasal sinuses are clear. Visualized orbits appear symmetric bilaterally. Other: Mastoid air cells are clear. IMPRESSION: Persistent vasogenic edema in the left temporal lobe adjacent to a known meningioma, not felt to be appreciably changed in size or contour. No new edema evident. No hemorrhage. There is underlying atrophy with periventricular small vessel disease. No evident acute infarct. There are foci of arterial vascular calcification. There is mucosal thickening in several ethmoid air cells bilaterally. Electronically Signed  By: Lowella Grip III M.D.   On: 11/01/2018 15:34    Assessment/Plan 1. Multifocal pneumonia Finishing Doxycyline Afebrile and doing well Regular diet recommended by speech  2. Essential hypertension BP controlled on Norvasc and Lisinopril Repeat BMP  3. Chronic diastolic heart failure (HCC) On Prn lasix for weight gain  4. Acute metabolic encephalopathy Seems back to his baseline Was alert and oriented Was walking with walker  Contineu trazadone at night   5. Hyperlipidemia,  On crestor     Family/ staff Communication:   Labs/tests ordered: CMP and CBC in 1 week Total time spent in this patient care encounter was  45_  minutes; greater than 50% of the visit spent counseling patient and staff, reviewing records , Labs and coordinating care for problems addressed at this encounter.

## 2018-11-06 LAB — CULTURE, BLOOD (ROUTINE X 2)
Culture: NO GROWTH
Culture: NO GROWTH
Special Requests: ADEQUATE

## 2018-11-09 ENCOUNTER — Encounter: Payer: Self-pay | Admitting: Internal Medicine

## 2018-11-09 ENCOUNTER — Non-Acute Institutional Stay (SKILLED_NURSING_FACILITY): Payer: Medicare HMO | Admitting: Internal Medicine

## 2018-11-09 DIAGNOSIS — I10 Essential (primary) hypertension: Secondary | ICD-10-CM

## 2018-11-09 DIAGNOSIS — E785 Hyperlipidemia, unspecified: Secondary | ICD-10-CM

## 2018-11-09 DIAGNOSIS — I5032 Chronic diastolic (congestive) heart failure: Secondary | ICD-10-CM

## 2018-11-09 DIAGNOSIS — J189 Pneumonia, unspecified organism: Secondary | ICD-10-CM | POA: Diagnosis not present

## 2018-11-09 NOTE — Progress Notes (Signed)
Location:  [facility] Room Number: 926/A Place of Service:  SNF [phone]) Provider: [name] L,MD   Deland Pretty, MD  Patient Care Team: Deland Pretty, MD as PCP - General (Internal Medicine) Gatha Mayer, MD as Consulting Physician (Gastroenterology) [name], [name], MD as Consulting Physician (Cardiology) [name], MD as Consulting Physician (Internal Medicine)  Extended Emergency Contact Information Primary Emergency Contact: Trost,[name] Address: [address]          [place], [place] [address] [name] of [place] Phone: [phone] Mobile Phone: [phone] Relation: Daughter Secondary Emergency Contact: Nipp,[name] Address: [place] [address] [place] Mobile Phone: [phone] Relation: Daughter  Code Status: Managed Care Goals of care: Advanced Directive information Advanced Directives 11/09/2018  Does Patient Have a Medical Advance Directive? Yes  Type of Advance Directive Living will  Does patient want to make changes to medical advance directive? No - Patient declined  Copy of [place] in Chart? -  Pre-existing out of facility DNR order (yellow form or pink MOST form) -     Chief Complaint  Patient presents with  . Acute Visit    Weight gain   . Health Maintenance    tetanus vaccine    HPI:  Pt is a 85 y.o. male seen today for an acute visit for LE edema and Weight Gain  [facility] for sepsis due to multifocal pneumonia and acute metabolic encephalopathy. Patient has a history of BPH, bradycardia, CAD, hypertension, diastolic CHF, GERD,H/o Left Sphenoid Meningioma  He is in SNF for therapy. Patient is doing well with that. Walking with the walker Nurses noticed worsening of his LE edema His weight is also up from 160 to 166 lbs. He says he takes Lasix as needed for his edema at home Denies any  Cough or SOB. Does get SOB on walking. No fever. Past Medical History:  Diagnosis Date  . Allergic rhinitis   . Amnesia   . BPH (benign prostatic hyperplasia)   . Bradycardia   . Colon polyps   . Coronary artery disease    MODERATE  . Dizziness   . Edema of lower extremity   . Erectile dysfunction   . H. pylori infection    Hx of   . Hard of hearing   . Headache(784.0)   . History of colon polyps 2009   Dr. Orr/Colonoscopy -small cecal adenoma  . Hx of colonoscopy 2009   Dr Lajoyce Corners, hemorrhoids & polyps  . Hyperlipidemia   . Hypertension   . Internal hemorrhoids 2009   Dr. Lajoyce Corners Colonoscopy  . Mitral valve regurgitation   . Reflux esophagitis    Past Surgical History:  Procedure Laterality Date  . APPENDECTOMY    . CARDIAC CATHETERIZATION  1997   REVEALED MILD TO MODERATE IRREGULARITIES. HIS LEFT EVNTRICULAR SYSTOLIC FUNCTION REVEALED NORMAL EJECTION FRACTION WITH EF OF 70%  . CATARACT EXTRACTION, BILATERAL    . COLONOSCOPY  multiple  . fatty tumor removal     benign  . HEMORROIDECTOMY    . HIP ARTHROPLASTY Left 08/17/2013   Procedure: ARTHROPLASTY BIPOLAR HIP;  Surgeon: Gearlean Alf, MD;  Location: [facility];  Service: Orthopedics;  Laterality: Left;  . INGUINAL HERNIA REPAIR Bilateral 2006  . lower back surgery  2006  . NASAL SEPTUM SURGERY    . TONSILLECTOMY    . UPPER  GASTROINTESTINAL ENDOSCOPY      No Known Allergies  Outpatient Encounter Medications as of 11/09/2018  Medication Sig  . acetaminophen (TYLENOL) 500 MG tablet Take 500 mg by mouth every 8 (eight) hours as needed.   Marland Kitchen amLODipine (NORVASC) 10 MG tablet Take 1 tablet (10 mg total) by mouth daily.  . Cholecalciferol 25 MCG (1000 UT) capsule Take 1,000 Units by mouth daily.  . furosemide (LASIX) 40 MG tablet Take 40 mg by mouth 2 (two) times daily as needed.   . hydrocortisone (ANUSOL-HC) 2.5 % rectal cream Place 1 application rectally daily as needed for hemorrhoids or anal itching. With perineal  applicator  . lisinopril-hydrochlorothiazide (PRINZIDE,ZESTORETIC) 20-25 MG tablet Take 1 tablet by mouth daily.  . Multiple Vitamin (MULTIVITAMIN) tablet Take 1 tablet by mouth daily.    . Multiple Vitamins-Minerals (ZINC PO) Take 50 mg by mouth daily.  Marland Kitchen NIACIN PO Take 500 mg by mouth daily. Mon, tues, thurs sat  . Omega-3 Fatty Acids (FISH OIL) 1000 MG CAPS Take 1 capsule by mouth daily.  . rosuvastatin (CRESTOR) 5 MG tablet Take 5 mg by mouth. Takes rarely  . traZODone (DESYREL) 50 MG tablet Take 0.5 tablets (25 mg total) by mouth at bedtime.  . vitamin B-12 (CYANOCOBALAMIN) 1000 MCG tablet Take 1,000 mcg by mouth daily.  . [DISCONTINUED] doxycycline (VIBRA-TABS) 100 MG tablet Take 100 mg by mouth 2 (two) times daily.  . [DISCONTINUED] OVER THE COUNTER MEDICATION Take 1 capsule by mouth daily. Vitamin D & Fish Oil in one large capsule  . [DISCONTINUED] saccharomyces boulardii (FLORASTOR) 250 MG capsule Take 250 mg by mouth 2 (two) times daily.  . [DISCONTINUED] zinc gluconate 50 MG tablet Take 100 mg by mouth 3 (three) times a week. Take on Mon wed and fridays   No facility-administered encounter medications on file as of 11/09/2018.     Review of Systems  Review of Systems  Constitutional: Negative for activity change, appetite change, chills, diaphoresis, fatigue and fever.  HENT: Negative for mouth sores, postnasal drip, rhinorrhea, sinus pain and sore throat.   Respiratory: Negative for apnea, cough, chest tightness, shortness of breath and wheezing.   Cardiovascular: Negative for chest pain, palpitations Gastrointestinal: Negative for abdominal distention, abdominal pain, constipation, diarrhea, nausea and vomiting.  Genitourinary: Negative for dysuria and frequency.  Musculoskeletal: Negative for arthralgias, joint swelling and myalgias.  Skin: Negative for rash.  Neurological: Negative for dizziness, syncope, weakness, light-headedness and numbness.  Psychiatric/Behavioral:  Negative for behavioral problems, confusion and sleep disturbance.     Immunization History  Administered Date(s) Administered  . Influenza-Unspecified 04/08/2016  . Pneumococcal Conjugate-13 06/02/2013  . Pneumococcal Polysaccharide-23 04/07/2001   Pertinent  Health Maintenance Due  Topic Date Due  . INFLUENZA VACCINE  01/15/2019  . PNA vac Low Risk Adult  Completed   Fall Risk  11/03/2017 07/11/2016  Falls in the past year? Yes Yes  Number falls in past yr: 2 or more 2 or more  Injury with Fall? Yes Yes  Comment - broke toe on right foot  Risk Factor Category  High Fall Risk -  Follow up Education provided;Falls prevention discussed -   Functional Status Survey:    Vitals:   11/09/18 1039  BP: (!) 120/54  Pulse: 60  Resp: 17  Temp: (!) 97.2 F (36.2 C)  SpO2: 98%  Weight: 164 lb 6.4 oz (74.6 kg)   Body mass index is 25.75 kg/m. Physical Exam Vitals signs reviewed.  HENT:  Head: Normocephalic.     Nose: Nose normal.     Mouth/Throat:     Mouth: Mucous membranes are moist.     Pharynx: Oropharynx is clear.  Eyes:     Pupils: Pupils are equal, round, and reactive to light.  Neck:     Musculoskeletal: Neck supple.  Cardiovascular:     Rate and Rhythm: Normal rate and regular rhythm.     Pulses: Normal pulses.  Pulmonary:     Effort: Pulmonary effort is normal.     Breath sounds: Normal breath sounds.  Abdominal:     General: Abdomen is flat. Bowel sounds are normal.     Palpations: Abdomen is soft.  Musculoskeletal:     Comments: Moderate swelling Bilateral  Skin:    General: Skin is warm and dry.  Neurological:     General: No focal deficit present.     Mental Status: He is alert.  Psychiatric:        Mood and Affect: Mood normal.        Thought Content: Thought content normal.        Judgment: Judgment normal.     Labs reviewed: Recent Labs    11/01/18 2348 11/02/18 0222 11/03/18 0337 11/04/18 0312  NA  --  134* 136 136  K  --  3.3*  3.7 3.3*  CL  --  99 101 103  CO2  --  24 24 23   GLUCOSE  --  114* 122* 107*  BUN  --  10 13 9   CREATININE  --  0.87 0.98 0.94  CALCIUM  --  8.7* 9.2 9.1  MG 1.8  --   --  1.8   Recent Labs    11/01/18 1624 11/04/18 0312  AST 31 26  ALT 24 22  ALKPHOS 64 48  BILITOT 1.2 1.8*  PROT 6.6 5.8*  ALBUMIN 4.1 3.5   Recent Labs    11/01/18 1405 11/02/18 0222 11/03/18 0337 11/04/18 0312  WBC 16.9* 12.8* 10.8* 9.0  NEUTROABS 15.0*  --   --  6.0  HGB 14.9 12.9* 13.0 12.8*  HCT 42.9 36.7* 37.3* 36.8*  MCV 94.3 93.6 93.3 92.5  PLT 223 192 202 186   Lab Results  Component Value Date   TSH 1.504 11/01/2018   No results found for: HGBA1C Lab Results  Component Value Date   CHOL 194 02/17/2017   HDL 72 02/17/2017   LDLCALC 105 (H) 02/17/2017   TRIG 87 02/17/2017   CHOLHDL 2.7 02/17/2017    Significant Diagnostic Results in last 30 days:  Dg Chest 2 View  Result Date: 11/01/2018 CLINICAL DATA:  85 year old male with altered mental status EXAM: CHEST - 2 VIEW COMPARISON:  10/07/2016 FINDINGS: Cardiomediastinal silhouette unchanged in size and contour. No evidence of central vascular congestion. No pneumothorax. No pleural effusion. Patchy opacities at the bilateral lung bases. No displaced fracture. Degenerative changes of the spine IMPRESSION: Patchy opacities bilateral lungs, potentially developing infection and/or atelectasis. Electronically Signed   By: Corrie Mckusick D.O.   On: 11/01/2018 14:53   Ct Head Wo Contrast  Result Date: 11/01/2018 CLINICAL DATA:  Altered mental status and decreased responsiveness EXAM: CT HEAD WITHOUT CONTRAST TECHNIQUE: Contiguous axial images were obtained from the base of the skull through the vertex without intravenous contrast. COMPARISON:  Brain MRI July 21, 2017; head CT August 08, 2018 FINDINGS: Brain: Mild diffuse atrophy is stable. There remains edema throughout much of the inferior left temporal lobe. A known meningioma  arising in the  medial left temporal lobe is better appreciated on prior MR. There is subtle increased attenuation in the area of the known meningioma, stable, measuring 2.2 x 0.9 cm. No similar appearing edema elsewhere. No other mass evident. There is no hemorrhage, extra-axial fluid collection, or midline shift. There is patchy small vessel disease in the centra semiovale bilaterally. No acute infarct is demonstrable on this study. Vascular: There is no appreciable hyperdense vessel. There is calcification in the distal left vertebral artery as well as in both carotid siphon regions. Skull: The bony calvarium appears intact. Sinuses/Orbits: There is mucosal thickening in several ethmoid air cells bilaterally. Other visualized paranasal sinuses are clear. Visualized orbits appear symmetric bilaterally. Other: Mastoid air cells are clear. IMPRESSION: Persistent vasogenic edema in the left temporal lobe adjacent to a known meningioma, not felt to be appreciably changed in size or contour. No new edema evident. No hemorrhage. There is underlying atrophy with periventricular small vessel disease. No evident acute infarct. There are foci of arterial vascular calcification. There is mucosal thickening in several ethmoid air cells bilaterally. Electronically Signed   By: Lowella Grip III M.D.   On: 11/01/2018 15:34    Assessment/Plan Chronic diastolic heart failure With LE edema Will start him on Lasix 40 mg QD Chnge his Lisinopril /HCTZ to only Lisinopril Discontinue HCTZ Follow weights Ted hoses if Patient agrees Repeat BMP   Essential hypertension On Lisinopril and Norvasc  Multifocal pneumonia Finished Antibiotics  Hyperlipidemia, On Statin  4. Acute metabolic encephalopathy Seems back to his baseline Was alert and oriented Was walking with walker Contineu trazadone at n Most likely would be discharged to [facility]  Family/ staff Communication:   Labs/tests ordered:   Total time spent in this patient  care encounter was  25_  minutes; greater than 50% of the visit spent counseling patient and staff, reviewing records , Labs and coordinating care for problems addressed at this encounter.

## 2018-11-11 DIAGNOSIS — E876 Hypokalemia: Secondary | ICD-10-CM | POA: Diagnosis not present

## 2018-11-11 DIAGNOSIS — J189 Pneumonia, unspecified organism: Secondary | ICD-10-CM | POA: Diagnosis not present

## 2018-11-15 DIAGNOSIS — E785 Hyperlipidemia, unspecified: Secondary | ICD-10-CM | POA: Diagnosis not present

## 2018-11-15 DIAGNOSIS — R2681 Unsteadiness on feet: Secondary | ICD-10-CM | POA: Diagnosis not present

## 2018-11-15 DIAGNOSIS — E786 Lipoprotein deficiency: Secondary | ICD-10-CM | POA: Diagnosis not present

## 2018-11-15 DIAGNOSIS — G9341 Metabolic encephalopathy: Secondary | ICD-10-CM | POA: Diagnosis not present

## 2018-11-15 DIAGNOSIS — J189 Pneumonia, unspecified organism: Secondary | ICD-10-CM | POA: Diagnosis not present

## 2018-11-15 DIAGNOSIS — A419 Sepsis, unspecified organism: Secondary | ICD-10-CM | POA: Diagnosis not present

## 2018-11-15 DIAGNOSIS — Z9181 History of falling: Secondary | ICD-10-CM | POA: Diagnosis not present

## 2018-11-15 DIAGNOSIS — Z20828 Contact with and (suspected) exposure to other viral communicable diseases: Secondary | ICD-10-CM | POA: Diagnosis not present

## 2018-11-15 DIAGNOSIS — M6281 Muscle weakness (generalized): Secondary | ICD-10-CM | POA: Diagnosis not present

## 2018-11-19 DIAGNOSIS — Z9181 History of falling: Secondary | ICD-10-CM | POA: Diagnosis not present

## 2018-11-19 DIAGNOSIS — Z7389 Other problems related to life management difficulty: Secondary | ICD-10-CM | POA: Diagnosis not present

## 2018-11-19 DIAGNOSIS — M25551 Pain in right hip: Secondary | ICD-10-CM | POA: Diagnosis not present

## 2018-11-19 DIAGNOSIS — N3946 Mixed incontinence: Secondary | ICD-10-CM | POA: Diagnosis not present

## 2018-11-19 DIAGNOSIS — A419 Sepsis, unspecified organism: Secondary | ICD-10-CM | POA: Diagnosis not present

## 2018-11-19 DIAGNOSIS — G9341 Metabolic encephalopathy: Secondary | ICD-10-CM | POA: Diagnosis not present

## 2018-11-19 DIAGNOSIS — R2681 Unsteadiness on feet: Secondary | ICD-10-CM | POA: Diagnosis not present

## 2018-11-19 DIAGNOSIS — R2689 Other abnormalities of gait and mobility: Secondary | ICD-10-CM | POA: Diagnosis not present

## 2018-11-20 DIAGNOSIS — R7611 Nonspecific reaction to tuberculin skin test without active tuberculosis: Secondary | ICD-10-CM | POA: Diagnosis not present

## 2018-11-22 DIAGNOSIS — Z7389 Other problems related to life management difficulty: Secondary | ICD-10-CM | POA: Diagnosis not present

## 2018-11-22 DIAGNOSIS — G9341 Metabolic encephalopathy: Secondary | ICD-10-CM | POA: Diagnosis not present

## 2018-11-22 DIAGNOSIS — A419 Sepsis, unspecified organism: Secondary | ICD-10-CM | POA: Diagnosis not present

## 2018-11-22 DIAGNOSIS — R2681 Unsteadiness on feet: Secondary | ICD-10-CM | POA: Diagnosis not present

## 2018-11-22 DIAGNOSIS — M25551 Pain in right hip: Secondary | ICD-10-CM | POA: Diagnosis not present

## 2018-11-22 DIAGNOSIS — N3946 Mixed incontinence: Secondary | ICD-10-CM | POA: Diagnosis not present

## 2018-11-22 DIAGNOSIS — R2689 Other abnormalities of gait and mobility: Secondary | ICD-10-CM | POA: Diagnosis not present

## 2018-11-22 DIAGNOSIS — Z9181 History of falling: Secondary | ICD-10-CM | POA: Diagnosis not present

## 2018-11-23 DIAGNOSIS — Z7389 Other problems related to life management difficulty: Secondary | ICD-10-CM | POA: Diagnosis not present

## 2018-11-23 DIAGNOSIS — N3946 Mixed incontinence: Secondary | ICD-10-CM | POA: Diagnosis not present

## 2018-11-23 DIAGNOSIS — M25551 Pain in right hip: Secondary | ICD-10-CM | POA: Diagnosis not present

## 2018-11-23 DIAGNOSIS — Z9181 History of falling: Secondary | ICD-10-CM | POA: Diagnosis not present

## 2018-11-23 DIAGNOSIS — A419 Sepsis, unspecified organism: Secondary | ICD-10-CM | POA: Diagnosis not present

## 2018-11-23 DIAGNOSIS — R2681 Unsteadiness on feet: Secondary | ICD-10-CM | POA: Diagnosis not present

## 2018-11-23 DIAGNOSIS — R2689 Other abnormalities of gait and mobility: Secondary | ICD-10-CM | POA: Diagnosis not present

## 2018-11-23 DIAGNOSIS — G9341 Metabolic encephalopathy: Secondary | ICD-10-CM | POA: Diagnosis not present

## 2018-11-24 DIAGNOSIS — R2681 Unsteadiness on feet: Secondary | ICD-10-CM | POA: Diagnosis not present

## 2018-11-24 DIAGNOSIS — Z7389 Other problems related to life management difficulty: Secondary | ICD-10-CM | POA: Diagnosis not present

## 2018-11-24 DIAGNOSIS — N3946 Mixed incontinence: Secondary | ICD-10-CM | POA: Diagnosis not present

## 2018-11-24 DIAGNOSIS — M25551 Pain in right hip: Secondary | ICD-10-CM | POA: Diagnosis not present

## 2018-11-24 DIAGNOSIS — R2689 Other abnormalities of gait and mobility: Secondary | ICD-10-CM | POA: Diagnosis not present

## 2018-11-24 DIAGNOSIS — A419 Sepsis, unspecified organism: Secondary | ICD-10-CM | POA: Diagnosis not present

## 2018-11-24 DIAGNOSIS — Z9181 History of falling: Secondary | ICD-10-CM | POA: Diagnosis not present

## 2018-11-24 DIAGNOSIS — G9341 Metabolic encephalopathy: Secondary | ICD-10-CM | POA: Diagnosis not present

## 2018-11-25 DIAGNOSIS — G9341 Metabolic encephalopathy: Secondary | ICD-10-CM | POA: Diagnosis not present

## 2018-11-25 DIAGNOSIS — M25551 Pain in right hip: Secondary | ICD-10-CM | POA: Diagnosis not present

## 2018-11-25 DIAGNOSIS — R2689 Other abnormalities of gait and mobility: Secondary | ICD-10-CM | POA: Diagnosis not present

## 2018-11-25 DIAGNOSIS — R2681 Unsteadiness on feet: Secondary | ICD-10-CM | POA: Diagnosis not present

## 2018-11-25 DIAGNOSIS — Z7389 Other problems related to life management difficulty: Secondary | ICD-10-CM | POA: Diagnosis not present

## 2018-11-25 DIAGNOSIS — A419 Sepsis, unspecified organism: Secondary | ICD-10-CM | POA: Diagnosis not present

## 2018-11-25 DIAGNOSIS — Z9181 History of falling: Secondary | ICD-10-CM | POA: Diagnosis not present

## 2018-11-25 DIAGNOSIS — N3946 Mixed incontinence: Secondary | ICD-10-CM | POA: Diagnosis not present

## 2018-11-26 ENCOUNTER — Encounter: Payer: Self-pay | Admitting: Internal Medicine

## 2018-11-26 ENCOUNTER — Non-Acute Institutional Stay: Payer: Medicare HMO | Admitting: Internal Medicine

## 2018-11-26 DIAGNOSIS — E785 Hyperlipidemia, unspecified: Secondary | ICD-10-CM

## 2018-11-26 DIAGNOSIS — I10 Essential (primary) hypertension: Secondary | ICD-10-CM

## 2018-11-26 DIAGNOSIS — G9341 Metabolic encephalopathy: Secondary | ICD-10-CM | POA: Diagnosis not present

## 2018-11-26 DIAGNOSIS — M25551 Pain in right hip: Secondary | ICD-10-CM | POA: Diagnosis not present

## 2018-11-26 DIAGNOSIS — R2681 Unsteadiness on feet: Secondary | ICD-10-CM | POA: Diagnosis not present

## 2018-11-26 DIAGNOSIS — Z9181 History of falling: Secondary | ICD-10-CM | POA: Diagnosis not present

## 2018-11-26 DIAGNOSIS — I5032 Chronic diastolic (congestive) heart failure: Secondary | ICD-10-CM

## 2018-11-26 DIAGNOSIS — I2510 Atherosclerotic heart disease of native coronary artery without angina pectoris: Secondary | ICD-10-CM

## 2018-11-26 DIAGNOSIS — J189 Pneumonia, unspecified organism: Secondary | ICD-10-CM

## 2018-11-26 DIAGNOSIS — N3946 Mixed incontinence: Secondary | ICD-10-CM | POA: Diagnosis not present

## 2018-11-26 DIAGNOSIS — I2583 Coronary atherosclerosis due to lipid rich plaque: Secondary | ICD-10-CM

## 2018-11-26 DIAGNOSIS — Z7389 Other problems related to life management difficulty: Secondary | ICD-10-CM | POA: Diagnosis not present

## 2018-11-26 DIAGNOSIS — A419 Sepsis, unspecified organism: Secondary | ICD-10-CM | POA: Diagnosis not present

## 2018-11-26 DIAGNOSIS — R2689 Other abnormalities of gait and mobility: Secondary | ICD-10-CM | POA: Diagnosis not present

## 2018-11-26 NOTE — Progress Notes (Signed)
Location:  [facility] Room Number: 907/A Place of Service:  ALF [phone]) Provider:Aizah Gehlhausen L,MD   [name] Pretty, MD  Patient Care Team: [name] Pretty, MD as PCP - General (Internal Medicine) Gatha Mayer, MD as Consulting Physician (Gastroenterology) [name], [name], MD as Consulting Physician (Cardiology) [name], MD as Consulting Physician (Internal Medicine)  Extended Emergency Contact Information Primary Emergency Contact: Goldberger,[name] Address: [address]          [place], [place] [address] [name] of [place] Phone: [phone] Mobile Phone: [phone] Relation: Daughter Secondary Emergency Contact: Kemple,[name] Address: [place] [address] [place] Mobile Phone: [phone] Relation: Daughter  Code Status: Full Code  Goals of care: Advanced Directive information Advanced Directives 11/26/2018  Does Patient Have a Medical Advance Directive? Yes  Type of Advance Directive Living will  Does patient want to make changes to medical advance directive? No - Patient declined  Copy of [place] in Chart? -  Pre-existing out of facility DNR order (yellow form or pink MOST form) -     Chief Complaint  Patient presents with   Acute Visit    Hip pain    HPI:  Pt is a 85 y.o. male seen today for an acute visit for  Right Hip Pain   Patient has a history of BPH, bradycardia, CAD, hypertension, diastolic CHF, GERD,H/o Left Sphenoid Meningioma and recently was in the hospital for Multifocal pneumonia and Acute Metabolic Encephalopathy He was admitted to SNF for therapy and now is transferred to [facility] for Long term Care He has been complaining of right hip pain for past few days. Patient does have a history of chronic pain in that leg.  He said that he was taking Tylenol twice daily at home.  But recently the pain is worse.  It is making hard for him to work with  Therapy. He does walk with a walker.  Per nurses and by patient he has not had any recent falls. He says Tylenol helps but then pain comes back His cough and shortness of breath is much better and he has not had any fever recently.  Swelling in his legs is also better.     Past Medical History:  Diagnosis Date   Allergic rhinitis    Amnesia    BPH (benign prostatic hyperplasia)    Bradycardia    Colon polyps    Coronary artery disease    MODERATE   Dizziness    Edema of lower extremity    Erectile dysfunction    H. pylori infection    Hx of    Hard of hearing    Headache(784.0)    History of colon polyps 2009   Dr. Orr/Colonoscopy -small cecal adenoma   Hx of colonoscopy 2009   Dr Lajoyce Corners, hemorrhoids & polyps   Hyperlipidemia    Hypertension    Internal hemorrhoids 2009   Dr. Lajoyce Corners Colonoscopy   Mitral valve regurgitation    Reflux esophagitis    Past Surgical History:  Procedure Laterality Date   APPENDECTOMY     CARDIAC CATHETERIZATION  1997   REVEALED MILD TO MODERATE IRREGULARITIES. HIS LEFT EVNTRICULAR SYSTOLIC FUNCTION REVEALED NORMAL EJECTION FRACTION WITH EF OF 70%   CATARACT EXTRACTION, BILATERAL     COLONOSCOPY  multiple   fatty tumor removal     benign   HEMORROIDECTOMY     HIP  ARTHROPLASTY Left 08/17/2013   Procedure: ARTHROPLASTY BIPOLAR HIP;  Surgeon: Gearlean Alf, MD;  Location: [facility];  Service: Orthopedics;  Laterality: Left;   INGUINAL HERNIA REPAIR Bilateral 2006   lower back surgery  2006   NASAL SEPTUM SURGERY     TONSILLECTOMY     UPPER GASTROINTESTINAL ENDOSCOPY      No Known Allergies  Outpatient Encounter Medications as of 11/26/2018  Medication Sig   acetaminophen (TYLENOL) 500 MG tablet Take 500 mg by mouth every 8 (eight) hours as needed.    amLODipine (NORVASC) 10 MG tablet Take 1 tablet (10 mg total) by mouth daily.   Cholecalciferol 25 MCG (1000 UT) capsule Take 1,000 Units by mouth daily.    furosemide (LASIX) 40 MG tablet Take 40 mg by mouth 2 (two) times daily as needed.    hydrocortisone (ANUSOL-HC) 2.5 % rectal cream Place 1 application rectally daily as needed for hemorrhoids or anal itching. With perineal applicator   lisinopril-hydrochlorothiazide (PRINZIDE,ZESTORETIC) 20-25 MG tablet Take 1 tablet by mouth daily.   Multiple Vitamin (MULTIVITAMIN) tablet Take 1 tablet by mouth daily.     NIACIN PO Take 500 mg by mouth daily. Mon, tues, thurs sat   Omega-3 Fatty Acids (FISH OIL) 1000 MG CAPS Take 1 capsule by mouth daily.   potassium chloride (KLOR-CON) 20 MEQ packet Take 20 mEq by mouth daily.   rosuvastatin (CRESTOR) 5 MG tablet Take 5 mg by mouth. Takes rarely   traZODone (DESYREL) 50 MG tablet Take 0.5 tablets (25 mg total) by mouth at bedtime.   vitamin B-12 (CYANOCOBALAMIN) 1000 MCG tablet Take 1,000 mcg by mouth daily.   Zinc 50 MG TABS Take 100 mg by mouth daily. On Mon and Friday   [DISCONTINUED] Multiple Vitamins-Minerals (ZINC PO) Take 50 mg by mouth daily.   No facility-administered encounter medications on file as of 11/26/2018.     Review of Systems  Constitutional: Positive for activity change.  HENT: Negative.   Respiratory: Negative.   Cardiovascular: Positive for leg swelling.  Gastrointestinal: Negative.   Genitourinary: Negative.   Musculoskeletal: Positive for arthralgias, gait problem and myalgias.  Skin: Negative.   Neurological: Positive for weakness.  Psychiatric/Behavioral: Negative.   All other systems reviewed and are negative.   Immunization History  Administered Date(s) Administered   Influenza-Unspecified 04/08/2016   Pneumococcal Conjugate-13 06/02/2013   Pneumococcal Polysaccharide-23 04/07/2001   Pertinent  Health Maintenance Due  Topic Date Due   INFLUENZA VACCINE  01/15/2019   PNA vac Low Risk Adult  Completed   Fall Risk  11/03/2017 07/11/2016  Falls in the past year? Yes Yes  Number falls in past yr: 2 or  more 2 or more  Injury with Fall? Yes Yes  Comment - broke toe on right foot  Risk Factor Category  High Fall Risk -  Follow up Education provided;Falls prevention discussed -   Functional Status Survey:    Vitals:   11/26/18 1227  BP: 130/70  Pulse: 92  Resp: 20  Temp: 98 F (36.7 C)  SpO2: 92%  Weight: 165 lb 6.4 oz (75 kg)  Height: 5\' 7"  (1.702 m)   Body mass index is 25.91 kg/m. Physical Exam Vitals signs reviewed.  Constitutional:      Appearance: Normal appearance.  HENT:     Head: Normocephalic.     Nose: Nose normal.     Mouth/Throat:     Mouth: Mucous membranes are moist.     Pharynx: Oropharynx is clear.  Eyes:     Pupils: Pupils are equal, round, and reactive to light.  Cardiovascular:     Rate and Rhythm: Normal rate and regular rhythm.     Pulses: Normal pulses.     Heart sounds: Normal heart sounds.  Pulmonary:     Effort: Pulmonary effort is normal. No respiratory distress.     Breath sounds: Normal breath sounds. No wheezing or rales.  Abdominal:     General: Abdomen is flat. Bowel sounds are normal.     Palpations: Abdomen is soft.  Musculoskeletal:     Comments: Mild swelling Bilateral  Right Hip Exam  Patient had Pain with both Flexion and Abduction of Hip Joint He also was c/o Pain walking with walker  Skin:    General: Skin is warm and dry.  Neurological:     General: No focal deficit present.     Mental Status: He is alert and oriented to person, place, and time.  Psychiatric:        Mood and Affect: Mood normal.        Thought Content: Thought content normal.        Judgment: Judgment normal.     Labs reviewed: Recent Labs    11/01/18 2348 11/02/18 0222 11/03/18 0337 11/04/18 0312  NA  --  134* 136 136  K  --  3.3* 3.7 3.3*  CL  --  99 101 103  CO2  --  24 24 23   GLUCOSE  --  114* 122* 107*  BUN  --  10 13 9   CREATININE  --  0.87 0.98 0.94  CALCIUM  --  8.7* 9.2 9.1  MG 1.8  --   --  1.8   Recent Labs     11/01/18 1624 11/04/18 0312  AST 31 26  ALT 24 22  ALKPHOS 64 48  BILITOT 1.2 1.8*  PROT 6.6 5.8*  ALBUMIN 4.1 3.5   Recent Labs    11/01/18 1405 11/02/18 0222 11/03/18 0337 11/04/18 0312  WBC 16.9* 12.8* 10.8* 9.0  NEUTROABS 15.0*  --   --  6.0  HGB 14.9 12.9* 13.0 12.8*  HCT 42.9 36.7* 37.3* 36.8*  MCV 94.3 93.6 93.3 92.5  PLT 223 192 202 186   Lab Results  Component Value Date   TSH 1.504 11/01/2018   No results found for: HGBA1C Lab Results  Component Value Date   CHOL 194 02/17/2017   HDL 72 02/17/2017   LDLCALC 105 (H) 02/17/2017   TRIG 87 02/17/2017   CHOLHDL 2.7 02/17/2017    Significant Diagnostic Results in last 30 days:  Dg Chest 2 View  Result Date: 11/01/2018 CLINICAL DATA:  85 year old male with altered mental status EXAM: CHEST - 2 VIEW COMPARISON:  10/07/2016 FINDINGS: Cardiomediastinal silhouette unchanged in size and contour. No evidence of central vascular congestion. No pneumothorax. No pleural effusion. Patchy opacities at the bilateral lung bases. No displaced fracture. Degenerative changes of the spine IMPRESSION: Patchy opacities bilateral lungs, potentially developing infection and/or atelectasis. Electronically Signed   By: Corrie Mckusick D.O.   On: 11/01/2018 14:53   Ct Head Wo Contrast  Result Date: 11/01/2018 CLINICAL DATA:  Altered mental status and decreased responsiveness EXAM: CT HEAD WITHOUT CONTRAST TECHNIQUE: Contiguous axial images were obtained from the base of the skull through the vertex without intravenous contrast. COMPARISON:  Brain MRI July 21, 2017; head CT August 08, 2018 FINDINGS: Brain: Mild diffuse atrophy is stable. There remains edema throughout much of the inferior  left temporal lobe. A known meningioma arising in the medial left temporal lobe is better appreciated on prior MR. There is subtle increased attenuation in the area of the known meningioma, stable, measuring 2.2 x 0.9 cm. No similar appearing edema  elsewhere. No other mass evident. There is no hemorrhage, extra-axial fluid collection, or midline shift. There is patchy small vessel disease in the centra semiovale bilaterally. No acute infarct is demonstrable on this study. Vascular: There is no appreciable hyperdense vessel. There is calcification in the distal left vertebral artery as well as in both carotid siphon regions. Skull: The bony calvarium appears intact. Sinuses/Orbits: There is mucosal thickening in several ethmoid air cells bilaterally. Other visualized paranasal sinuses are clear. Visualized orbits appear symmetric bilaterally. Other: Mastoid air cells are clear. IMPRESSION: Persistent vasogenic edema in the left temporal lobe adjacent to a known meningioma, not felt to be appreciably changed in size or contour. No new edema evident. No hemorrhage. There is underlying atrophy with periventricular small vessel disease. No evident acute infarct. There are foci of arterial vascular calcification. There is mucosal thickening in several ethmoid air cells bilaterally. Electronically Signed   By: Lowella Grip III M.D.   On: 11/01/2018 15:34    Assessment/Plan Right Hip Pain Tylenol 650 mg BID Xray of Right hip for Arthritis Possible ortho Referral for Steroid injection   Essential hypertension - Plan:  On Norvasc and Lisinopril  Chronic diastolic heart failure - Plan:  On Chronic Lasix now Will Repeat BMP  Multifocal pneumonia - Plan:  Doing well Symptoms Resolved  Hyperlipidemia, On Crestor Acute metabolic encephalopathy Seems back to his baseline Is alert and oriented Is walking with walker Contineu trazadone       Family/ staff Communication:   Labs/tests ordered:  BMP and CBC Xray of the right Hip Total time spent in this patient care encounter was  40_  minutes; greater than 50% of the visit spent counseling patient and staff, reviewing records , Labs and coordinating care for problems addressed at this  encounter.

## 2018-11-29 DIAGNOSIS — A419 Sepsis, unspecified organism: Secondary | ICD-10-CM | POA: Diagnosis not present

## 2018-11-29 DIAGNOSIS — R2681 Unsteadiness on feet: Secondary | ICD-10-CM | POA: Diagnosis not present

## 2018-11-29 DIAGNOSIS — M25551 Pain in right hip: Secondary | ICD-10-CM | POA: Diagnosis not present

## 2018-11-29 DIAGNOSIS — N3946 Mixed incontinence: Secondary | ICD-10-CM | POA: Diagnosis not present

## 2018-11-29 DIAGNOSIS — R2689 Other abnormalities of gait and mobility: Secondary | ICD-10-CM | POA: Diagnosis not present

## 2018-11-29 DIAGNOSIS — Z9181 History of falling: Secondary | ICD-10-CM | POA: Diagnosis not present

## 2018-11-29 DIAGNOSIS — Z7389 Other problems related to life management difficulty: Secondary | ICD-10-CM | POA: Diagnosis not present

## 2018-11-29 DIAGNOSIS — G9341 Metabolic encephalopathy: Secondary | ICD-10-CM | POA: Diagnosis not present

## 2018-11-30 DIAGNOSIS — Z9181 History of falling: Secondary | ICD-10-CM | POA: Diagnosis not present

## 2018-11-30 DIAGNOSIS — Z7389 Other problems related to life management difficulty: Secondary | ICD-10-CM | POA: Diagnosis not present

## 2018-11-30 DIAGNOSIS — N3946 Mixed incontinence: Secondary | ICD-10-CM | POA: Diagnosis not present

## 2018-11-30 DIAGNOSIS — E876 Hypokalemia: Secondary | ICD-10-CM | POA: Diagnosis not present

## 2018-11-30 DIAGNOSIS — A419 Sepsis, unspecified organism: Secondary | ICD-10-CM | POA: Diagnosis not present

## 2018-11-30 DIAGNOSIS — M25551 Pain in right hip: Secondary | ICD-10-CM | POA: Diagnosis not present

## 2018-11-30 DIAGNOSIS — R2681 Unsteadiness on feet: Secondary | ICD-10-CM | POA: Diagnosis not present

## 2018-11-30 DIAGNOSIS — R2689 Other abnormalities of gait and mobility: Secondary | ICD-10-CM | POA: Diagnosis not present

## 2018-11-30 DIAGNOSIS — G9341 Metabolic encephalopathy: Secondary | ICD-10-CM | POA: Diagnosis not present

## 2018-11-30 LAB — BASIC METABOLIC PANEL
BUN: 13 (ref 4–21)
Creatinine: 1 (ref 0.6–1.3)
Glucose: 101
Potassium: 4.1 (ref 3.4–5.3)
Sodium: 138 (ref 137–147)

## 2018-11-30 LAB — CBC AND DIFFERENTIAL
HCT: 41 (ref 41–53)
Hemoglobin: 14 (ref 13.5–17.5)
Platelets: 200 (ref 150–399)
WBC: 5.5

## 2018-12-01 DIAGNOSIS — R2689 Other abnormalities of gait and mobility: Secondary | ICD-10-CM | POA: Diagnosis not present

## 2018-12-01 DIAGNOSIS — Z9181 History of falling: Secondary | ICD-10-CM | POA: Diagnosis not present

## 2018-12-01 DIAGNOSIS — N3946 Mixed incontinence: Secondary | ICD-10-CM | POA: Diagnosis not present

## 2018-12-01 DIAGNOSIS — R2681 Unsteadiness on feet: Secondary | ICD-10-CM | POA: Diagnosis not present

## 2018-12-01 DIAGNOSIS — Z7389 Other problems related to life management difficulty: Secondary | ICD-10-CM | POA: Diagnosis not present

## 2018-12-01 DIAGNOSIS — G9341 Metabolic encephalopathy: Secondary | ICD-10-CM | POA: Diagnosis not present

## 2018-12-01 DIAGNOSIS — M25551 Pain in right hip: Secondary | ICD-10-CM | POA: Diagnosis not present

## 2018-12-01 DIAGNOSIS — A419 Sepsis, unspecified organism: Secondary | ICD-10-CM | POA: Diagnosis not present

## 2018-12-02 DIAGNOSIS — Z9181 History of falling: Secondary | ICD-10-CM | POA: Diagnosis not present

## 2018-12-02 DIAGNOSIS — A419 Sepsis, unspecified organism: Secondary | ICD-10-CM | POA: Diagnosis not present

## 2018-12-02 DIAGNOSIS — N3946 Mixed incontinence: Secondary | ICD-10-CM | POA: Diagnosis not present

## 2018-12-02 DIAGNOSIS — R2689 Other abnormalities of gait and mobility: Secondary | ICD-10-CM | POA: Diagnosis not present

## 2018-12-02 DIAGNOSIS — M25551 Pain in right hip: Secondary | ICD-10-CM | POA: Diagnosis not present

## 2018-12-02 DIAGNOSIS — Z7389 Other problems related to life management difficulty: Secondary | ICD-10-CM | POA: Diagnosis not present

## 2018-12-02 DIAGNOSIS — G9341 Metabolic encephalopathy: Secondary | ICD-10-CM | POA: Diagnosis not present

## 2018-12-02 DIAGNOSIS — R2681 Unsteadiness on feet: Secondary | ICD-10-CM | POA: Diagnosis not present

## 2018-12-03 DIAGNOSIS — R2681 Unsteadiness on feet: Secondary | ICD-10-CM | POA: Diagnosis not present

## 2018-12-03 DIAGNOSIS — R2689 Other abnormalities of gait and mobility: Secondary | ICD-10-CM | POA: Diagnosis not present

## 2018-12-03 DIAGNOSIS — N3946 Mixed incontinence: Secondary | ICD-10-CM | POA: Diagnosis not present

## 2018-12-03 DIAGNOSIS — G9341 Metabolic encephalopathy: Secondary | ICD-10-CM | POA: Diagnosis not present

## 2018-12-03 DIAGNOSIS — M25551 Pain in right hip: Secondary | ICD-10-CM | POA: Diagnosis not present

## 2018-12-03 DIAGNOSIS — Z9181 History of falling: Secondary | ICD-10-CM | POA: Diagnosis not present

## 2018-12-03 DIAGNOSIS — Z7389 Other problems related to life management difficulty: Secondary | ICD-10-CM | POA: Diagnosis not present

## 2018-12-03 DIAGNOSIS — A419 Sepsis, unspecified organism: Secondary | ICD-10-CM | POA: Diagnosis not present

## 2018-12-07 DIAGNOSIS — A419 Sepsis, unspecified organism: Secondary | ICD-10-CM | POA: Diagnosis not present

## 2018-12-07 DIAGNOSIS — Z7389 Other problems related to life management difficulty: Secondary | ICD-10-CM | POA: Diagnosis not present

## 2018-12-07 DIAGNOSIS — R2681 Unsteadiness on feet: Secondary | ICD-10-CM | POA: Diagnosis not present

## 2018-12-07 DIAGNOSIS — R2689 Other abnormalities of gait and mobility: Secondary | ICD-10-CM | POA: Diagnosis not present

## 2018-12-07 DIAGNOSIS — G9341 Metabolic encephalopathy: Secondary | ICD-10-CM | POA: Diagnosis not present

## 2018-12-07 DIAGNOSIS — Z9181 History of falling: Secondary | ICD-10-CM | POA: Diagnosis not present

## 2018-12-07 DIAGNOSIS — N3946 Mixed incontinence: Secondary | ICD-10-CM | POA: Diagnosis not present

## 2018-12-07 DIAGNOSIS — M25551 Pain in right hip: Secondary | ICD-10-CM | POA: Diagnosis not present

## 2018-12-08 DIAGNOSIS — N3946 Mixed incontinence: Secondary | ICD-10-CM | POA: Diagnosis not present

## 2018-12-08 DIAGNOSIS — R2681 Unsteadiness on feet: Secondary | ICD-10-CM | POA: Diagnosis not present

## 2018-12-08 DIAGNOSIS — G9341 Metabolic encephalopathy: Secondary | ICD-10-CM | POA: Diagnosis not present

## 2018-12-08 DIAGNOSIS — M25551 Pain in right hip: Secondary | ICD-10-CM | POA: Diagnosis not present

## 2018-12-08 DIAGNOSIS — R2689 Other abnormalities of gait and mobility: Secondary | ICD-10-CM | POA: Diagnosis not present

## 2018-12-08 DIAGNOSIS — Z7389 Other problems related to life management difficulty: Secondary | ICD-10-CM | POA: Diagnosis not present

## 2018-12-08 DIAGNOSIS — A419 Sepsis, unspecified organism: Secondary | ICD-10-CM | POA: Diagnosis not present

## 2018-12-08 DIAGNOSIS — Z9181 History of falling: Secondary | ICD-10-CM | POA: Diagnosis not present

## 2018-12-09 DIAGNOSIS — G9341 Metabolic encephalopathy: Secondary | ICD-10-CM | POA: Diagnosis not present

## 2018-12-09 DIAGNOSIS — R2681 Unsteadiness on feet: Secondary | ICD-10-CM | POA: Diagnosis not present

## 2018-12-09 DIAGNOSIS — R2689 Other abnormalities of gait and mobility: Secondary | ICD-10-CM | POA: Diagnosis not present

## 2018-12-09 DIAGNOSIS — M25551 Pain in right hip: Secondary | ICD-10-CM | POA: Diagnosis not present

## 2018-12-09 DIAGNOSIS — Z9181 History of falling: Secondary | ICD-10-CM | POA: Diagnosis not present

## 2018-12-09 DIAGNOSIS — Z7389 Other problems related to life management difficulty: Secondary | ICD-10-CM | POA: Diagnosis not present

## 2018-12-09 DIAGNOSIS — A419 Sepsis, unspecified organism: Secondary | ICD-10-CM | POA: Diagnosis not present

## 2018-12-09 DIAGNOSIS — N3946 Mixed incontinence: Secondary | ICD-10-CM | POA: Diagnosis not present

## 2018-12-10 ENCOUNTER — Encounter: Payer: Self-pay | Admitting: Nurse Practitioner

## 2018-12-10 ENCOUNTER — Non-Acute Institutional Stay: Payer: Medicare HMO | Admitting: Nurse Practitioner

## 2018-12-10 DIAGNOSIS — M545 Low back pain, unspecified: Secondary | ICD-10-CM | POA: Insufficient documentation

## 2018-12-10 DIAGNOSIS — Z7389 Other problems related to life management difficulty: Secondary | ICD-10-CM | POA: Diagnosis not present

## 2018-12-10 DIAGNOSIS — M25551 Pain in right hip: Secondary | ICD-10-CM

## 2018-12-10 DIAGNOSIS — I10 Essential (primary) hypertension: Secondary | ICD-10-CM | POA: Diagnosis not present

## 2018-12-10 DIAGNOSIS — R2689 Other abnormalities of gait and mobility: Secondary | ICD-10-CM | POA: Diagnosis not present

## 2018-12-10 DIAGNOSIS — I5032 Chronic diastolic (congestive) heart failure: Secondary | ICD-10-CM

## 2018-12-10 DIAGNOSIS — G9341 Metabolic encephalopathy: Secondary | ICD-10-CM | POA: Diagnosis not present

## 2018-12-10 DIAGNOSIS — A419 Sepsis, unspecified organism: Secondary | ICD-10-CM | POA: Diagnosis not present

## 2018-12-10 DIAGNOSIS — M5441 Lumbago with sciatica, right side: Secondary | ICD-10-CM | POA: Diagnosis not present

## 2018-12-10 DIAGNOSIS — R102 Pelvic and perineal pain: Secondary | ICD-10-CM | POA: Diagnosis not present

## 2018-12-10 DIAGNOSIS — R2681 Unsteadiness on feet: Secondary | ICD-10-CM | POA: Diagnosis not present

## 2018-12-10 DIAGNOSIS — N3946 Mixed incontinence: Secondary | ICD-10-CM | POA: Diagnosis not present

## 2018-12-10 DIAGNOSIS — Z9181 History of falling: Secondary | ICD-10-CM | POA: Diagnosis not present

## 2018-12-10 NOTE — Assessment & Plan Note (Signed)
blood pressure is controlled, continue Lisinopril 20mg  qd, Amlodipine 10mg  qd.

## 2018-12-10 NOTE — Progress Notes (Signed)
Location:  [facility] Room Number: [phone] Place of Service:  ALF [phone]) Provider:  Odesser Tourangeau Otho Darner, NP  Deland Pretty, MD  Patient Care Team: Deland Pretty, MD as PCP - General (Internal Medicine) Gatha Mayer, MD as Consulting Physician (Gastroenterology) [name], [name], MD as Consulting Physician (Cardiology) [name], MD as Consulting Physician (Internal Medicine)  Extended Emergency Contact Information Primary Emergency Contact: Hustead,[name] Address: [address]          [place], [place] [address] [name] of [place] Phone: [phone] Mobile Phone: [phone] Relation: Daughter Secondary Emergency Contact: Sakai,[name] Address: [place] [address] [place] Mobile Phone: [phone] Relation: Daughter  Code Status:  FULL Goals of care: Advanced Directive information Advanced Directives 12/10/2018  Does Patient Have a Medical Advance Directive? Yes  Type of Advance Directive Living will  Does patient want to make changes to medical advance directive? No - Patient declined  Copy of [place] in Chart? -  Pre-existing out of facility DNR order (yellow form or pink MOST form) -     Chief Complaint  Patient presents with  . Acute Visit    Right hip/leg pain    HPI:  Pt is a 85 y.o. male seen today for an acute visit for lower back, right hip, right inner thigh pain, positional, X-ray of R hip showed no acute fractures, Tylenol 650mg  q8hr, 500mg  q8h prn is not adequate for pain relief. Insomnia, takes Trazodone 25 mg qd. HTN, blood pressure is controlled on Lisinopril 20mg  qd, Amlodipine 10mg  qd.  CHF/trace edema BLE, compensated on Furosemide 40mg  qd.    Past Medical History:  Diagnosis Date  . Allergic rhinitis   . Amnesia   . BPH (benign prostatic hyperplasia)   . Bradycardia   . Colon polyps   . Coronary artery disease    MODERATE  . Dizziness   .  Edema of lower extremity   . Erectile dysfunction   . H. pylori infection    Hx of   . Hard of hearing   . Headache(784.0)   . History of colon polyps 2009   Dr. Orr/Colonoscopy -small cecal adenoma  . Hx of colonoscopy 2009   Dr Lajoyce Corners, hemorrhoids & polyps  . Hyperlipidemia   . Hypertension   . Internal hemorrhoids 2009   Dr. Lajoyce Corners Colonoscopy  . Mitral valve regurgitation   . Reflux esophagitis    Past Surgical History:  Procedure Laterality Date  . APPENDECTOMY    . CARDIAC CATHETERIZATION  1997   REVEALED MILD TO MODERATE IRREGULARITIES. HIS LEFT EVNTRICULAR SYSTOLIC FUNCTION REVEALED NORMAL EJECTION FRACTION WITH EF OF 70%  . CATARACT EXTRACTION, BILATERAL    . COLONOSCOPY  multiple  . fatty tumor removal     benign  . HEMORROIDECTOMY    . HIP ARTHROPLASTY Left 08/17/2013   Procedure: ARTHROPLASTY BIPOLAR HIP;  Surgeon: Gearlean Alf, MD;  Location: [facility];  Service: Orthopedics;  Laterality: Left;  . INGUINAL HERNIA REPAIR Bilateral 2006  . lower back surgery  2006  . NASAL SEPTUM SURGERY    . TONSILLECTOMY    . UPPER GASTROINTESTINAL ENDOSCOPY      No Known Allergies  Outpatient Encounter Medications as of 12/10/2018  Medication Sig  . acetaminophen (TYLENOL) 500 MG tablet Take 500 mg by mouth every 8 (eight) hours as needed.   Marland Kitchen acetaminophen (TYLENOL) 650 MG CR  tablet Take 650 mg by mouth every 8 (eight) hours.  Marland Kitchen amLODipine (NORVASC) 10 MG tablet Take 1 tablet (10 mg total) by mouth daily.  . Cholecalciferol 25 MCG (1000 UT) capsule Take 1,000 Units by mouth daily.  Marland Kitchen DEXTRAN 70-HYPROMELLOSE, PF, OP Place 1 drop into both eyes daily as needed.  . furosemide (LASIX) 40 MG tablet Take 40 mg by mouth.  . hydrocortisone (ANUSOL-HC) 2.5 % rectal cream Place 1 application rectally daily as needed for hemorrhoids or anal itching. With perineal applicator  . lisinopril (ZESTRIL) 20 MG tablet Take 20 mg by mouth daily.  . Multiple Vitamin (MULTIVITAMIN) tablet Take 1  tablet by mouth daily.    . Omega-3 Fatty Acids (FISH OIL) 1000 MG CAPS Take 1 capsule by mouth daily.  . potassium chloride (KLOR-CON) 20 MEQ packet Take 20 mEq by mouth daily.  . rosuvastatin (CRESTOR) 5 MG tablet Take 5 mg by mouth. Takes rarely  . sodium chloride (OCEAN) 0.65 % SOLN nasal spray Place 1 spray into both nostrils daily as needed for congestion.  . traZODone (DESYREL) 50 MG tablet Take 0.5 tablets (25 mg total) by mouth at bedtime.  . vitamin B-12 (CYANOCOBALAMIN) 1000 MCG tablet Take 1,000 mcg by mouth daily.  . Zinc 50 MG TABS Take 100 mg by mouth daily. On Mon and Friday  . [DISCONTINUED] NIACIN PO Take 500 mg by mouth daily. Mon, tues, thurs sat   No facility-administered encounter medications on file as of 12/10/2018.     Review of Systems  Constitutional: Negative for activity change, appetite change, chills, diaphoresis, fatigue, fever and unexpected weight change.  HENT: Positive for hearing loss. Negative for congestion and voice change.   Eyes: Negative for visual disturbance.  Respiratory: Negative for cough, shortness of breath and wheezing.   Cardiovascular: Positive for leg swelling. Negative for chest pain and palpitations.  Gastrointestinal: Negative for abdominal distention, abdominal pain, constipation, diarrhea, nausea and vomiting.  Genitourinary: Negative for difficulty urinating, dysuria and urgency.  Musculoskeletal: Positive for arthralgias and gait problem. Negative for joint swelling.  Skin: Negative for color change and pallor.  Neurological: Negative for dizziness, speech difficulty, weakness and headaches.       Memory lapses.   Psychiatric/Behavioral: Positive for sleep disturbance. Negative for agitation, behavioral problems and hallucinations. The patient is not nervous/anxious.        Dreams    Immunization History  Administered Date(s) Administered  . Influenza-Unspecified 04/08/2016  . Pneumococcal Conjugate-13 06/02/2013  .  Pneumococcal Polysaccharide-23 04/07/2001   Pertinent  Health Maintenance Due  Topic Date Due  . INFLUENZA VACCINE  01/15/2019  . PNA vac Low Risk Adult  Completed   Fall Risk  11/03/2017 07/11/2016  Falls in the past year? Yes Yes  Number falls in past yr: 2 or more 2 or more  Injury with Fall? Yes Yes  Comment - broke toe on right foot  Risk Factor Category  High Fall Risk -  Follow up Education provided;Falls prevention discussed -   Functional Status Survey:    Vitals:   12/10/18 1542  BP: 140/60  Pulse: 76  Resp: 20  Temp: 98.9 F (37.2 C)  TempSrc: Oral  SpO2: 96%  Weight: 164 lb 12.8 oz (74.8 kg)  Height: 5\' 7"  (1.702 m)   Body mass index is 25.81 kg/m. Physical Exam Vitals signs and nursing note reviewed.  Constitutional:      General: He is not in acute distress.    Appearance: Normal appearance.  He is normal weight. He is not ill-appearing, toxic-appearing or diaphoretic.  HENT:     Head: Normocephalic and atraumatic.     Nose: Nose normal. No congestion or rhinorrhea.     Mouth/Throat:     Mouth: Mucous membranes are moist.  Eyes:     Extraocular Movements: Extraocular movements intact.     Conjunctiva/sclera: Conjunctivae normal.     Pupils: Pupils are equal, round, and reactive to light.  Neck:     Musculoskeletal: Normal range of motion and neck supple.  Cardiovascular:     Rate and Rhythm: Normal rate and regular rhythm.     Heart sounds: No murmur.  Pulmonary:     Effort: Pulmonary effort is normal.     Breath sounds: No wheezing, rhonchi or rales.  Abdominal:     General: Bowel sounds are normal. There is no distension.     Palpations: Abdomen is soft.     Tenderness: There is no abdominal tenderness. There is no right CVA tenderness, left CVA tenderness, guarding or rebound.  Musculoskeletal:        General: Tenderness present.     Right lower leg: Edema present.     Left lower leg: Edema present.     Comments: Trace edema BLE. Ambulates  with walker. Right lower back, hip, thigh pain with ROM of the R leg, weight bearing.   Skin:    General: Skin is warm and dry.  Neurological:     General: No focal deficit present.     Mental Status: He is alert. Mental status is at baseline.     Cranial Nerves: No cranial nerve deficit.     Motor: No weakness.     Coordination: Coordination normal.     Gait: Gait abnormal.     Comments: Oriented to person and place.   Psychiatric:        Mood and Affect: Mood normal.        Behavior: Behavior normal.        Thought Content: Thought content normal.        Judgment: Judgment normal.     Labs reviewed: Recent Labs    11/01/18 2348 11/02/18 0222 11/03/18 0337 11/04/18 0312 11/30/18  NA  --  134* 136 136 138  K  --  3.3* 3.7 3.3* 4.1  CL  --  99 101 103  --   CO2  --  24 24 23   --   GLUCOSE  --  114* 122* 107*  --   BUN  --  10 13 9 13   CREATININE  --  0.87 0.98 0.94 1.0  CALCIUM  --  8.7* 9.2 9.1  --   MG 1.8  --   --  1.8  --    Recent Labs    11/01/18 1624 11/04/18 0312  AST 31 26  ALT 24 22  ALKPHOS 64 48  BILITOT 1.2 1.8*  PROT 6.6 5.8*  ALBUMIN 4.1 3.5   Recent Labs    11/01/18 1405 11/02/18 0222 11/03/18 0337 11/04/18 0312 11/30/18  WBC 16.9* 12.8* 10.8* 9.0 5.5  NEUTROABS 15.0*  --   --  6.0  --   HGB 14.9 12.9* 13.0 12.8* 14.0  HCT 42.9 36.7* 37.3* 36.8* 41  MCV 94.3 93.6 93.3 92.5  --   PLT 223 192 202 186 200   Lab Results  Component Value Date   TSH 1.504 11/01/2018   No results found for: HGBA1C Lab Results  Component Value  Date   CHOL 194 02/17/2017   HDL 72 02/17/2017   LDLCALC 105 (H) 02/17/2017   TRIG 87 02/17/2017   CHOLHDL 2.7 02/17/2017    Significant Diagnostic Results in last 30 days:   Assessment/Plan Lower back pain Right lower back, right hip, right thigh, positional, continue Tylenol 650mg  q8hr, 500mg  q8h prn. X-ray lumbar spine, pelvis to r/o fxs. Adding Gabapentin 100mg  qhs in setting of sciatica nerve pain in  nature. Observe.   Right hip pain Persisted, Ortho needed.   Hypertension  blood pressure is controlled, continue Lisinopril 20mg  qd, Amlodipine 10mg  qd.    Chronic diastolic heart failure CHF/trace edema BLE, compensated, continue  Furosemide 40mg  qd.        Family/ staff Communication: plan of care reviewed with the patient and charge nurse.   Labs/tests ordered:  X-ray lumbar spine, pelvis.   Time spend 25 minutes.

## 2018-12-10 NOTE — Assessment & Plan Note (Signed)
CHF/trace edema BLE, compensated, continue  Furosemide 40mg  qd.

## 2018-12-10 NOTE — Assessment & Plan Note (Signed)
Right lower back, right hip, right thigh, positional, continue Tylenol 650mg  q8hr, 500mg  q8h prn. X-ray lumbar spine, pelvis to r/o fxs. Adding Gabapentin 100mg  qhs in setting of sciatica nerve pain in nature. Observe.

## 2018-12-10 NOTE — Assessment & Plan Note (Signed)
Persisted, Ortho needed.

## 2018-12-13 DIAGNOSIS — A419 Sepsis, unspecified organism: Secondary | ICD-10-CM | POA: Diagnosis not present

## 2018-12-13 DIAGNOSIS — N3946 Mixed incontinence: Secondary | ICD-10-CM | POA: Diagnosis not present

## 2018-12-13 DIAGNOSIS — G9341 Metabolic encephalopathy: Secondary | ICD-10-CM | POA: Diagnosis not present

## 2018-12-13 DIAGNOSIS — M25551 Pain in right hip: Secondary | ICD-10-CM | POA: Diagnosis not present

## 2018-12-13 DIAGNOSIS — Z7389 Other problems related to life management difficulty: Secondary | ICD-10-CM | POA: Diagnosis not present

## 2018-12-13 DIAGNOSIS — Z9181 History of falling: Secondary | ICD-10-CM | POA: Diagnosis not present

## 2018-12-13 DIAGNOSIS — R2681 Unsteadiness on feet: Secondary | ICD-10-CM | POA: Diagnosis not present

## 2018-12-13 DIAGNOSIS — R2689 Other abnormalities of gait and mobility: Secondary | ICD-10-CM | POA: Diagnosis not present

## 2018-12-14 DIAGNOSIS — R2689 Other abnormalities of gait and mobility: Secondary | ICD-10-CM | POA: Diagnosis not present

## 2018-12-14 DIAGNOSIS — M25551 Pain in right hip: Secondary | ICD-10-CM | POA: Diagnosis not present

## 2018-12-14 DIAGNOSIS — A419 Sepsis, unspecified organism: Secondary | ICD-10-CM | POA: Diagnosis not present

## 2018-12-14 DIAGNOSIS — R2681 Unsteadiness on feet: Secondary | ICD-10-CM | POA: Diagnosis not present

## 2018-12-14 DIAGNOSIS — Z9181 History of falling: Secondary | ICD-10-CM | POA: Diagnosis not present

## 2018-12-14 DIAGNOSIS — G9341 Metabolic encephalopathy: Secondary | ICD-10-CM | POA: Diagnosis not present

## 2018-12-14 DIAGNOSIS — Z7389 Other problems related to life management difficulty: Secondary | ICD-10-CM | POA: Diagnosis not present

## 2018-12-14 DIAGNOSIS — N3946 Mixed incontinence: Secondary | ICD-10-CM | POA: Diagnosis not present

## 2018-12-15 ENCOUNTER — Ambulatory Visit: Payer: Medicare HMO | Admitting: Orthopaedic Surgery

## 2018-12-15 DIAGNOSIS — R2689 Other abnormalities of gait and mobility: Secondary | ICD-10-CM | POA: Diagnosis not present

## 2018-12-15 DIAGNOSIS — M25551 Pain in right hip: Secondary | ICD-10-CM | POA: Diagnosis not present

## 2018-12-15 DIAGNOSIS — N3946 Mixed incontinence: Secondary | ICD-10-CM | POA: Diagnosis not present

## 2018-12-15 DIAGNOSIS — R2681 Unsteadiness on feet: Secondary | ICD-10-CM | POA: Diagnosis not present

## 2018-12-15 DIAGNOSIS — Z7389 Other problems related to life management difficulty: Secondary | ICD-10-CM | POA: Diagnosis not present

## 2018-12-15 DIAGNOSIS — A419 Sepsis, unspecified organism: Secondary | ICD-10-CM | POA: Diagnosis not present

## 2018-12-15 DIAGNOSIS — G9341 Metabolic encephalopathy: Secondary | ICD-10-CM | POA: Diagnosis not present

## 2018-12-15 DIAGNOSIS — M6281 Muscle weakness (generalized): Secondary | ICD-10-CM | POA: Diagnosis not present

## 2018-12-16 DIAGNOSIS — R2689 Other abnormalities of gait and mobility: Secondary | ICD-10-CM | POA: Diagnosis not present

## 2018-12-16 DIAGNOSIS — R2681 Unsteadiness on feet: Secondary | ICD-10-CM | POA: Diagnosis not present

## 2018-12-16 DIAGNOSIS — A419 Sepsis, unspecified organism: Secondary | ICD-10-CM | POA: Diagnosis not present

## 2018-12-16 DIAGNOSIS — N3946 Mixed incontinence: Secondary | ICD-10-CM | POA: Diagnosis not present

## 2018-12-16 DIAGNOSIS — M6281 Muscle weakness (generalized): Secondary | ICD-10-CM | POA: Diagnosis not present

## 2018-12-16 DIAGNOSIS — M25551 Pain in right hip: Secondary | ICD-10-CM | POA: Diagnosis not present

## 2018-12-16 DIAGNOSIS — Z7389 Other problems related to life management difficulty: Secondary | ICD-10-CM | POA: Diagnosis not present

## 2018-12-16 DIAGNOSIS — G9341 Metabolic encephalopathy: Secondary | ICD-10-CM | POA: Diagnosis not present

## 2018-12-21 DIAGNOSIS — Z1159 Encounter for screening for other viral diseases: Secondary | ICD-10-CM | POA: Diagnosis not present

## 2018-12-24 DIAGNOSIS — M6281 Muscle weakness (generalized): Secondary | ICD-10-CM | POA: Diagnosis not present

## 2018-12-24 DIAGNOSIS — R2681 Unsteadiness on feet: Secondary | ICD-10-CM | POA: Diagnosis not present

## 2018-12-24 DIAGNOSIS — Z7389 Other problems related to life management difficulty: Secondary | ICD-10-CM | POA: Diagnosis not present

## 2018-12-24 DIAGNOSIS — G9341 Metabolic encephalopathy: Secondary | ICD-10-CM | POA: Diagnosis not present

## 2018-12-24 DIAGNOSIS — N3946 Mixed incontinence: Secondary | ICD-10-CM | POA: Diagnosis not present

## 2018-12-24 DIAGNOSIS — A419 Sepsis, unspecified organism: Secondary | ICD-10-CM | POA: Diagnosis not present

## 2018-12-24 DIAGNOSIS — M25551 Pain in right hip: Secondary | ICD-10-CM | POA: Diagnosis not present

## 2018-12-24 DIAGNOSIS — R2689 Other abnormalities of gait and mobility: Secondary | ICD-10-CM | POA: Diagnosis not present

## 2018-12-28 DIAGNOSIS — N3946 Mixed incontinence: Secondary | ICD-10-CM | POA: Diagnosis not present

## 2018-12-28 DIAGNOSIS — Z7389 Other problems related to life management difficulty: Secondary | ICD-10-CM | POA: Diagnosis not present

## 2018-12-28 DIAGNOSIS — M25551 Pain in right hip: Secondary | ICD-10-CM | POA: Diagnosis not present

## 2018-12-28 DIAGNOSIS — R2681 Unsteadiness on feet: Secondary | ICD-10-CM | POA: Diagnosis not present

## 2018-12-28 DIAGNOSIS — G9341 Metabolic encephalopathy: Secondary | ICD-10-CM | POA: Diagnosis not present

## 2018-12-28 DIAGNOSIS — A419 Sepsis, unspecified organism: Secondary | ICD-10-CM | POA: Diagnosis not present

## 2018-12-28 DIAGNOSIS — M6281 Muscle weakness (generalized): Secondary | ICD-10-CM | POA: Diagnosis not present

## 2018-12-28 DIAGNOSIS — R2689 Other abnormalities of gait and mobility: Secondary | ICD-10-CM | POA: Diagnosis not present

## 2018-12-29 ENCOUNTER — Ambulatory Visit: Payer: Medicare HMO | Admitting: Orthopaedic Surgery

## 2018-12-29 DIAGNOSIS — R2689 Other abnormalities of gait and mobility: Secondary | ICD-10-CM | POA: Diagnosis not present

## 2018-12-29 DIAGNOSIS — M6281 Muscle weakness (generalized): Secondary | ICD-10-CM | POA: Diagnosis not present

## 2018-12-29 DIAGNOSIS — A419 Sepsis, unspecified organism: Secondary | ICD-10-CM | POA: Diagnosis not present

## 2018-12-29 DIAGNOSIS — G9341 Metabolic encephalopathy: Secondary | ICD-10-CM | POA: Diagnosis not present

## 2018-12-29 DIAGNOSIS — Z7389 Other problems related to life management difficulty: Secondary | ICD-10-CM | POA: Diagnosis not present

## 2018-12-29 DIAGNOSIS — N3946 Mixed incontinence: Secondary | ICD-10-CM | POA: Diagnosis not present

## 2018-12-29 DIAGNOSIS — M25551 Pain in right hip: Secondary | ICD-10-CM | POA: Diagnosis not present

## 2018-12-29 DIAGNOSIS — R2681 Unsteadiness on feet: Secondary | ICD-10-CM | POA: Diagnosis not present

## 2018-12-30 DIAGNOSIS — R2681 Unsteadiness on feet: Secondary | ICD-10-CM | POA: Diagnosis not present

## 2018-12-30 DIAGNOSIS — A419 Sepsis, unspecified organism: Secondary | ICD-10-CM | POA: Diagnosis not present

## 2018-12-30 DIAGNOSIS — N3946 Mixed incontinence: Secondary | ICD-10-CM | POA: Diagnosis not present

## 2018-12-30 DIAGNOSIS — M6281 Muscle weakness (generalized): Secondary | ICD-10-CM | POA: Diagnosis not present

## 2018-12-30 DIAGNOSIS — Z7389 Other problems related to life management difficulty: Secondary | ICD-10-CM | POA: Diagnosis not present

## 2018-12-30 DIAGNOSIS — G9341 Metabolic encephalopathy: Secondary | ICD-10-CM | POA: Diagnosis not present

## 2018-12-30 DIAGNOSIS — M25551 Pain in right hip: Secondary | ICD-10-CM | POA: Diagnosis not present

## 2018-12-30 DIAGNOSIS — R2689 Other abnormalities of gait and mobility: Secondary | ICD-10-CM | POA: Diagnosis not present

## 2018-12-31 ENCOUNTER — Ambulatory Visit: Payer: Medicare HMO | Admitting: Physician Assistant

## 2018-12-31 DIAGNOSIS — R2681 Unsteadiness on feet: Secondary | ICD-10-CM | POA: Diagnosis not present

## 2018-12-31 DIAGNOSIS — R2689 Other abnormalities of gait and mobility: Secondary | ICD-10-CM | POA: Diagnosis not present

## 2018-12-31 DIAGNOSIS — M25551 Pain in right hip: Secondary | ICD-10-CM | POA: Diagnosis not present

## 2018-12-31 DIAGNOSIS — A419 Sepsis, unspecified organism: Secondary | ICD-10-CM | POA: Diagnosis not present

## 2018-12-31 DIAGNOSIS — N3946 Mixed incontinence: Secondary | ICD-10-CM | POA: Diagnosis not present

## 2018-12-31 DIAGNOSIS — Z7389 Other problems related to life management difficulty: Secondary | ICD-10-CM | POA: Diagnosis not present

## 2018-12-31 DIAGNOSIS — M6281 Muscle weakness (generalized): Secondary | ICD-10-CM | POA: Diagnosis not present

## 2018-12-31 DIAGNOSIS — G9341 Metabolic encephalopathy: Secondary | ICD-10-CM | POA: Diagnosis not present

## 2019-01-03 DIAGNOSIS — B342 Coronavirus infection, unspecified: Secondary | ICD-10-CM | POA: Diagnosis not present

## 2019-01-03 DIAGNOSIS — G9341 Metabolic encephalopathy: Secondary | ICD-10-CM | POA: Diagnosis not present

## 2019-01-03 DIAGNOSIS — Z7389 Other problems related to life management difficulty: Secondary | ICD-10-CM | POA: Diagnosis not present

## 2019-01-03 DIAGNOSIS — R2689 Other abnormalities of gait and mobility: Secondary | ICD-10-CM | POA: Diagnosis not present

## 2019-01-03 DIAGNOSIS — M6281 Muscle weakness (generalized): Secondary | ICD-10-CM | POA: Diagnosis not present

## 2019-01-03 DIAGNOSIS — R2681 Unsteadiness on feet: Secondary | ICD-10-CM | POA: Diagnosis not present

## 2019-01-03 DIAGNOSIS — A419 Sepsis, unspecified organism: Secondary | ICD-10-CM | POA: Diagnosis not present

## 2019-01-03 DIAGNOSIS — M25551 Pain in right hip: Secondary | ICD-10-CM | POA: Diagnosis not present

## 2019-01-03 DIAGNOSIS — N3946 Mixed incontinence: Secondary | ICD-10-CM | POA: Diagnosis not present

## 2019-01-05 DIAGNOSIS — A419 Sepsis, unspecified organism: Secondary | ICD-10-CM | POA: Diagnosis not present

## 2019-01-05 DIAGNOSIS — G9341 Metabolic encephalopathy: Secondary | ICD-10-CM | POA: Diagnosis not present

## 2019-01-05 DIAGNOSIS — M6281 Muscle weakness (generalized): Secondary | ICD-10-CM | POA: Diagnosis not present

## 2019-01-05 DIAGNOSIS — Z7389 Other problems related to life management difficulty: Secondary | ICD-10-CM | POA: Diagnosis not present

## 2019-01-05 DIAGNOSIS — N3946 Mixed incontinence: Secondary | ICD-10-CM | POA: Diagnosis not present

## 2019-01-05 DIAGNOSIS — R2689 Other abnormalities of gait and mobility: Secondary | ICD-10-CM | POA: Diagnosis not present

## 2019-01-05 DIAGNOSIS — R2681 Unsteadiness on feet: Secondary | ICD-10-CM | POA: Diagnosis not present

## 2019-01-05 DIAGNOSIS — M25551 Pain in right hip: Secondary | ICD-10-CM | POA: Diagnosis not present

## 2019-01-07 ENCOUNTER — Ambulatory Visit (INDEPENDENT_AMBULATORY_CARE_PROVIDER_SITE_OTHER): Payer: Medicare HMO | Admitting: Orthopaedic Surgery

## 2019-01-07 ENCOUNTER — Telehealth: Payer: Self-pay | Admitting: Orthopaedic Surgery

## 2019-01-07 ENCOUNTER — Ambulatory Visit (INDEPENDENT_AMBULATORY_CARE_PROVIDER_SITE_OTHER): Payer: Medicare HMO

## 2019-01-07 ENCOUNTER — Telehealth: Payer: Self-pay | Admitting: Physician Assistant

## 2019-01-07 ENCOUNTER — Encounter: Payer: Self-pay | Admitting: Physician Assistant

## 2019-01-07 ENCOUNTER — Other Ambulatory Visit: Payer: Self-pay

## 2019-01-07 VITALS — Ht 67.0 in | Wt 164.0 lb

## 2019-01-07 DIAGNOSIS — M25551 Pain in right hip: Secondary | ICD-10-CM | POA: Diagnosis not present

## 2019-01-07 DIAGNOSIS — R2681 Unsteadiness on feet: Secondary | ICD-10-CM | POA: Diagnosis not present

## 2019-01-07 DIAGNOSIS — G9341 Metabolic encephalopathy: Secondary | ICD-10-CM | POA: Diagnosis not present

## 2019-01-07 DIAGNOSIS — M5416 Radiculopathy, lumbar region: Secondary | ICD-10-CM | POA: Diagnosis not present

## 2019-01-07 DIAGNOSIS — Z7389 Other problems related to life management difficulty: Secondary | ICD-10-CM | POA: Diagnosis not present

## 2019-01-07 DIAGNOSIS — R2689 Other abnormalities of gait and mobility: Secondary | ICD-10-CM | POA: Diagnosis not present

## 2019-01-07 DIAGNOSIS — A419 Sepsis, unspecified organism: Secondary | ICD-10-CM | POA: Diagnosis not present

## 2019-01-07 DIAGNOSIS — N3946 Mixed incontinence: Secondary | ICD-10-CM | POA: Diagnosis not present

## 2019-01-07 DIAGNOSIS — I69351 Hemiplegia and hemiparesis following cerebral infarction affecting right dominant side: Secondary | ICD-10-CM | POA: Diagnosis not present

## 2019-01-07 DIAGNOSIS — M62838 Other muscle spasm: Secondary | ICD-10-CM | POA: Diagnosis not present

## 2019-01-07 DIAGNOSIS — M6281 Muscle weakness (generalized): Secondary | ICD-10-CM | POA: Diagnosis not present

## 2019-01-07 DIAGNOSIS — M531 Cervicobrachial syndrome: Secondary | ICD-10-CM | POA: Diagnosis not present

## 2019-01-07 NOTE — Telephone Encounter (Signed)
faxed

## 2019-01-07 NOTE — Telephone Encounter (Signed)
[place] called and stated to see if someone can resend orders from [facility] today and the AVS to them. Patient has misplaced.  [phone]

## 2019-01-07 NOTE — Telephone Encounter (Signed)
Can you send my note from today?  I wrote on a rx pad and sent with patient.  He may have lost it.  I am recommending medrol dosepak and referral to dr. Ernestina Patches for [facility]

## 2019-01-07 NOTE — Telephone Encounter (Signed)
Pleas advise on order. Thanks.

## 2019-01-07 NOTE — Telephone Encounter (Signed)
Can you please fax this for me? 

## 2019-01-07 NOTE — Telephone Encounter (Signed)
Received voicemail message from [place] with [facility] needing orders faxed over to her. The fax# is [phone]

## 2019-01-07 NOTE — Progress Notes (Signed)
Office Visit Note   Patient: [name] [name]           Date of Birth: March 01, 1925           MRN: [id number] Visit Date: 01/07/2019              Requested by: Deland Pretty, MD [address] [place],   [address] PCP: Deland Pretty, MD   Assessment & Plan: Visit Diagnoses:  1. Pain in right hip     Plan: Impression is right hip pain likely from L2-3 and L4-5.  I have written on his SNF paperwork that we recommend medrol dosepak.  We will refer the patient to Dr. Ernestina Patches for an ESI.  He will follow-up with [place] as needed. Total face to face encounter time was greater than 45 minutes and over half of this time was spent in counseling and/or coordination of care.  Follow-Up Instructions: Return if symptoms worsen or fail to improve.   Orders:  Orders Placed This Encounter  Procedures   XR HIP UNILAT W OR W/O PELVIS 2-3 VIEWS RIGHT   No orders of the defined types were placed in this encounter.     Procedures: No procedures performed   Clinical Data: No additional findings.   Subjective: Chief Complaint  Patient presents with   Right Hip - Pain    HPI patient is a pleasant 85 year old gentleman who presents our clinic today with right groin and medial thigh pain.  He notes that this is been ongoing since a fall he sustained after blacking out approximately 2-1/2 to 3 months ago at his assisted living facility.[facility]  He was seen in the ED but at that point x-rays were not obtained of the right hip.  Prior to the fall, he notes he was ambulating with a walker and without hip pain.  He is now hardly able to stand up and ambulate with a walker as he often gets significant right hip pain.  He has no pain at rest.  He is now living in a skilled nursing facility.  Of note, he is approximately 5 years out left hip hemiarthroplasty.  He also had an MRI of his lumbar spine 1 year ago which showed prominent on the listhesis and facet degenerative changes most severe at  L2-3 and L4-5.  Review of Systems as detailed in HPI.  All others reviewed and are negative.   Objective: Vital Signs: Ht 5\' 7"  (1.702 m)    Wt 164 lb (74.4 kg)    BMI 25.69 kg/m   Physical Exam well-developed and well-nourished gentleman in no acute distress.  Alert and oriented x3.  Ortho Exam examination of his right hip reveals full range of motion without pain.  Negative logroll.  Negative straight leg raise.  No pain with lumbar flexion or extension.  No focal weakness.  He is neurovascularly intact distally.  Specialty Comments:  No specialty comments available.  Imaging: Xr Hip Unilat W Or W/o Pelvis 2-3 Views Right  Result Date: 01/07/2019 No acute or structural abnormalities    PMFS History: Patient Active Problem List   Diagnosis Date Noted   Lower back pain 12/10/2018   Right hip pain 11/26/2018   Goals of care, counseling/discussion    Palliative care by specialist    Sepsis ([place]) 28/31/5176   Acute metabolic encephalopathy 16/12/3708   Hypokalemia 11/02/2018   Multifocal pneumonia 11/01/2018   Right leg weakness 11/03/2017   Bleeding internal hemorrhoids 10/28/2017   Chronic diastolic  heart failure ([place]) 08/17/2013   Femur fracture, left ([facility]) 08/16/2013   Bradycardia, severe sinus 10/30/2011   Personal history of colonic adenoma 01/09/2011   Family history of malignant neoplasm of colon cancer-mother 01/09/2011   Coronary artery disease    Hyperlipidemia    Hypertension    Headache(784.0)    Past Medical History:  Diagnosis Date   Allergic rhinitis    Amnesia    BPH (benign prostatic hyperplasia)    Bradycardia    Colon polyps    Coronary artery disease    MODERATE   Dizziness    Edema of lower extremity    Erectile dysfunction    H. pylori infection    Hx of    Hard of hearing    Headache(784.0)    History of colon polyps 2009   Dr. Orr/Colonoscopy -small cecal adenoma   Hx of colonoscopy 2009   Dr Lajoyce Corners,  hemorrhoids & polyps   Hyperlipidemia    Hypertension    Internal hemorrhoids 2009   Dr. Lajoyce Corners Colonoscopy   Mitral valve regurgitation    Reflux esophagitis     Family History  Problem Relation Age of Onset   Colon cancer Mother 13       second cancer in 45's deceased after emergency surgery   Breast cancer Sister     Past Surgical History:  Procedure Laterality Date   APPENDECTOMY     CARDIAC CATHETERIZATION  1997   REVEALED MILD TO MODERATE IRREGULARITIES. HIS LEFT EVNTRICULAR SYSTOLIC FUNCTION REVEALED NORMAL EJECTION FRACTION WITH EF OF 70%   CATARACT EXTRACTION, BILATERAL     COLONOSCOPY  multiple   fatty tumor removal     benign   HEMORROIDECTOMY     HIP ARTHROPLASTY Left 08/17/2013   Procedure: ARTHROPLASTY BIPOLAR HIP;  Surgeon: Gearlean Alf, MD;  Location: [facility];  Service: Orthopedics;  Laterality: Left;   INGUINAL HERNIA REPAIR Bilateral 2006   lower back surgery  2006   NASAL SEPTUM SURGERY     TONSILLECTOMY     UPPER GASTROINTESTINAL ENDOSCOPY     Social History   Occupational History   Not on file  Tobacco Use   Smoking status: Former Smoker    Quit date: 10/25/1975    Years since quitting: 43.2   Smokeless tobacco: Never Used  Substance and Sexual Activity   Alcohol use: Yes    Comment: occ 6-7 times a year   Drug use: No   Sexual activity: Not on file

## 2019-01-10 DIAGNOSIS — A419 Sepsis, unspecified organism: Secondary | ICD-10-CM | POA: Diagnosis not present

## 2019-01-10 DIAGNOSIS — G9341 Metabolic encephalopathy: Secondary | ICD-10-CM | POA: Diagnosis not present

## 2019-01-10 DIAGNOSIS — R2681 Unsteadiness on feet: Secondary | ICD-10-CM | POA: Diagnosis not present

## 2019-01-10 DIAGNOSIS — Z7389 Other problems related to life management difficulty: Secondary | ICD-10-CM | POA: Diagnosis not present

## 2019-01-10 DIAGNOSIS — M6281 Muscle weakness (generalized): Secondary | ICD-10-CM | POA: Diagnosis not present

## 2019-01-10 DIAGNOSIS — N3946 Mixed incontinence: Secondary | ICD-10-CM | POA: Diagnosis not present

## 2019-01-10 DIAGNOSIS — M25551 Pain in right hip: Secondary | ICD-10-CM | POA: Diagnosis not present

## 2019-01-10 DIAGNOSIS — R2689 Other abnormalities of gait and mobility: Secondary | ICD-10-CM | POA: Diagnosis not present

## 2019-01-10 NOTE — Telephone Encounter (Signed)
Faxed to both [phone] & [phone]

## 2019-01-12 DIAGNOSIS — Z03818 Encounter for observation for suspected exposure to other biological agents ruled out: Secondary | ICD-10-CM | POA: Diagnosis not present

## 2019-01-19 DIAGNOSIS — Z1159 Encounter for screening for other viral diseases: Secondary | ICD-10-CM | POA: Diagnosis not present

## 2019-01-21 DIAGNOSIS — Z79899 Other long term (current) drug therapy: Secondary | ICD-10-CM | POA: Diagnosis not present

## 2019-01-21 DIAGNOSIS — E876 Hypokalemia: Secondary | ICD-10-CM | POA: Diagnosis not present

## 2019-01-21 DIAGNOSIS — E785 Hyperlipidemia, unspecified: Secondary | ICD-10-CM | POA: Diagnosis not present

## 2019-01-22 LAB — LIPID PANEL
Cholesterol: 134 (ref 0–200)
HDL: 57 (ref 35–70)
LDL Cholesterol: 63
LDl/HDL Ratio: 2.4
Triglycerides: 59 (ref 40–160)

## 2019-01-24 ENCOUNTER — Ambulatory Visit (INDEPENDENT_AMBULATORY_CARE_PROVIDER_SITE_OTHER): Payer: Medicare HMO | Admitting: Physical Medicine and Rehabilitation

## 2019-01-24 ENCOUNTER — Encounter: Payer: Self-pay | Admitting: Physical Medicine and Rehabilitation

## 2019-01-24 ENCOUNTER — Ambulatory Visit: Payer: Medicare HMO

## 2019-01-24 DIAGNOSIS — M5416 Radiculopathy, lumbar region: Secondary | ICD-10-CM

## 2019-01-24 DIAGNOSIS — M25551 Pain in right hip: Secondary | ICD-10-CM

## 2019-01-24 MED ORDER — BETAMETHASONE SOD PHOS & ACET 6 (3-3) MG/ML IJ SUSP
12.0000 mg | Freq: Once | INTRAMUSCULAR | Status: AC
  Administered 2019-01-24: 12 mg

## 2019-01-24 NOTE — Progress Notes (Signed)
Pt states pain in right hip (groin pain). Pt states pain started 2-3 years ago. Pt states walking makes pain better. Pt states nothing helps with pain.   .Numeric Pain Rating Scale and Functional Assessment Average Pain 9   In the last MONTH (on 0-10 scale) has pain interfered with the following?  1. General activity like being  able to carry out your everyday physical activities such as walking, climbing stairs, carrying groceries, or moving a chair?  Rating(8)  -Dye Allergies.

## 2019-01-25 NOTE — Progress Notes (Signed)
[name] [name] - 85 y.o. male MRN [id number]  Date of birth: 10/31/1924  Office Visit Note: Visit Date: 01/24/2019 PCP: Deland Pretty, MD Referred by: Deland Pretty, MD  Subjective: Chief Complaint  Patient presents with   Right Hip - Pain   HPI:  [name] [name] is a 85 y.o. male who comes in today For planned right lumbar epidural injection.  Was seen and evaluated by Tawanna Cooler, PA-C for Dr. Eduard Roux.  Patient has had prior left total hip arthroplasty with continued posterior right low back and hip and thigh pain.  He really does not get much past the knee.  He is a difficult historian we probably had to speak with him about 30 minutes because of different questions he had about medications and treatment and x-rays.  We likely need to get him a follow-up appointment with Mendel Ryder just to make sure he knows what is going on.  He is 85 years old and he is in a nursing facility.  He is oriented and we did talk quite a bit I think part of it may have been anxiety with an injection.  We went over his MRI and spine models.  He has more of an issue at L2-3 but some at L4-5.  The L4-5 issue is more on the left side.  We decided to complete a right L3 transforaminal epidural steroid injection to see if we get him some relief of his hip and thigh pain.  X-rays of his hips were not very impressive.  He really does not have much in the way of groin pain.  He is failed conservative care including medication management and activity modification.  He has chronic multiple medical conditions.  This injection would be hopefully diagnostic and therapeutic.  ROS Otherwise per HPI.  Assessment & Plan: Visit Diagnoses:  1. Pain in right hip   2. Lumbar radiculopathy     Plan: No additional findings.   Meds & Orders:  Meds ordered this encounter  Medications   betamethasone acetate-betamethasone sodium phosphate (CELESTONE) injection 12 mg    Orders Placed This Encounter  Procedures    XR C-ARM NO REPORT   Epidural Steroid injection    Follow-up: Return for  Eduard Roux, M.D. or Tawanna Cooler, PA-C.   Procedures: No procedures performed  Lumbosacral Transforaminal Epidural Steroid Injection - Sub-Pedicular Approach with Fluoroscopic Guidance  Patient: [name] [name]      Date of Birth: [birth year] MRN: [id number] PCP: Deland Pretty, MD      Visit Date: 01/24/2019   Universal Protocol:    Date/Time: 01/24/2019  Consent Given By: the patient  Position: PRONE  Additional Comments: Vital signs were monitored before and after the procedure. Patient was prepped and draped in the usual sterile fashion. The correct patient, procedure, and site was verified.   Injection Procedure Details:  Procedure Site One Meds Administered:  Meds ordered this encounter  Medications   betamethasone acetate-betamethasone sodium phosphate (CELESTONE) injection 12 mg    Laterality: Right  Location/Site:  L3-L4  Needle size: 46 G  Needle type: Spinal  Needle Placement: Transforaminal  Findings:    -Comments: Excellent flow of contrast along the nerve and into the epidural space.  Procedure Details: After squaring off the end-plates to get a true AP view, the C-arm was positioned so that an oblique view of the foramen as noted above was visualized. The target area is just inferior to the "nose of the scotty dog" or sub pedicular.  The soft tissues overlying this structure were infiltrated with 2-3 ml. of 1% Lidocaine without Epinephrine.  The spinal needle was inserted toward the target using a "trajectory" view along the fluoroscope beam.  Under AP and lateral visualization, the needle was advanced so it did not puncture dura and was located close the 6 O'Clock position of the pedical in AP tracterory. Biplanar projections were used to confirm position. Aspiration was confirmed to be negative for CSF and/or blood. A 1-2 ml. volume of Isovue-250 was injected and flow  of contrast was noted at each level. Radiographs were obtained for documentation purposes.   After attaining the desired flow of contrast documented above, a 0.5 to 1.0 ml test dose of 0.25% Marcaine was injected into each respective transforaminal space.  The patient was observed for 90 seconds post injection.  After no sensory deficits were reported, and normal lower extremity motor function was noted,   the above injectate was administered so that equal amounts of the injectate were placed at each foramen (level) into the transforaminal epidural space.   Additional Comments:  The patient tolerated the procedure well Dressing: 2 x 2 sterile gauze and Band-Aid    Post-procedure details: Patient was observed during the procedure. Post-procedure instructions were reviewed.  Patient left the clinic in stable condition.    Clinical History: 12/02/18 IMPRESSION:  Abnormal MRI Lumbar Spine showing prominent spondylitic and facet degenerative changes throughout most severe at L 2-3 where there is bilateral severe foraminal and moderate canal stenosis and impingement of bilateral nerve roots and and at L 4-5 where there is severe left sided foraminal narrowing with exiting nerve root impingement. Compared to prior MRI films from 09/30/2004 these changes appear much progressed.     INTERPRETING PHYSICIAN:  Antony Contras, MD Certified in  [place] by [place] of Neuroimaging and [employer] for Neurological Subspecialities     Objective:  VS:  HT:     WT:    BMI:      BP:    HR: bpm   TEMP: ( )   RESP:  Physical Exam  Ortho Exam Imaging: Xr C-arm No Report  Result Date: 01/24/2019 Please see Notes tab for imaging impression.

## 2019-01-25 NOTE — Procedures (Signed)
Lumbosacral Transforaminal Epidural Steroid Injection - Sub-Pedicular Approach with Fluoroscopic Guidance  Patient: [name] [name]      Date of Birth: Aug 18, 1924 MRN: [id number] PCP: Deland Pretty, MD      Visit Date: 01/24/2019   Universal Protocol:    Date/Time: 01/24/2019  Consent Given By: the patient  Position: PRONE  Additional Comments: Vital signs were monitored before and after the procedure. Patient was prepped and draped in the usual sterile fashion. The correct patient, procedure, and site was verified.   Injection Procedure Details:  Procedure Site One Meds Administered:  Meds ordered this encounter  Medications  . betamethasone acetate-betamethasone sodium phosphate (CELESTONE) injection 12 mg    Laterality: Right  Location/Site:  L3-L4  Needle size: 22 G  Needle type: Spinal  Needle Placement: Transforaminal  Findings:    -Comments: Excellent flow of contrast along the nerve and into the epidural space.  Procedure Details: After squaring off the end-plates to get a true AP view, the C-arm was positioned so that an oblique view of the foramen as noted above was visualized. The target area is just inferior to the "nose of the scotty dog" or sub pedicular. The soft tissues overlying this structure were infiltrated with 2-3 ml. of 1% Lidocaine without Epinephrine.  The spinal needle was inserted toward the target using a "trajectory" view along the fluoroscope beam.  Under AP and lateral visualization, the needle was advanced so it did not puncture dura and was located close the 6 O'Clock position of the pedical in AP tracterory. Biplanar projections were used to confirm position. Aspiration was confirmed to be negative for CSF and/or blood. A 1-2 ml. volume of Isovue-250 was injected and flow of contrast was noted at each level. Radiographs were obtained for documentation purposes.   After attaining the desired flow of contrast documented above, a 0.5  to 1.0 ml test dose of 0.25% Marcaine was injected into each respective transforaminal space.  The patient was observed for 90 seconds post injection.  After no sensory deficits were reported, and normal lower extremity motor function was noted,   the above injectate was administered so that equal amounts of the injectate were placed at each foramen (level) into the transforaminal epidural space.   Additional Comments:  The patient tolerated the procedure well Dressing: 2 x 2 sterile gauze and Band-Aid    Post-procedure details: Patient was observed during the procedure. Post-procedure instructions were reviewed.  Patient left the clinic in stable condition.

## 2019-01-26 ENCOUNTER — Encounter: Payer: Self-pay | Admitting: Nurse Practitioner

## 2019-01-26 DIAGNOSIS — F411 Generalized anxiety disorder: Secondary | ICD-10-CM | POA: Insufficient documentation

## 2019-01-26 DIAGNOSIS — R69 Illness, unspecified: Secondary | ICD-10-CM | POA: Diagnosis not present

## 2019-02-03 ENCOUNTER — Encounter: Payer: Self-pay | Admitting: Nurse Practitioner

## 2019-02-03 ENCOUNTER — Other Ambulatory Visit: Payer: Self-pay | Admitting: *Deleted

## 2019-02-03 ENCOUNTER — Non-Acute Institutional Stay: Payer: Medicare HMO | Admitting: Nurse Practitioner

## 2019-02-03 DIAGNOSIS — I5032 Chronic diastolic (congestive) heart failure: Secondary | ICD-10-CM | POA: Diagnosis not present

## 2019-02-03 DIAGNOSIS — D519 Vitamin B12 deficiency anemia, unspecified: Secondary | ICD-10-CM | POA: Diagnosis not present

## 2019-02-03 DIAGNOSIS — M25551 Pain in right hip: Secondary | ICD-10-CM | POA: Diagnosis not present

## 2019-02-03 DIAGNOSIS — I10 Essential (primary) hypertension: Secondary | ICD-10-CM | POA: Diagnosis not present

## 2019-02-03 DIAGNOSIS — M5441 Lumbago with sciatica, right side: Secondary | ICD-10-CM

## 2019-02-03 DIAGNOSIS — F411 Generalized anxiety disorder: Secondary | ICD-10-CM

## 2019-02-03 DIAGNOSIS — D649 Anemia, unspecified: Secondary | ICD-10-CM | POA: Insufficient documentation

## 2019-02-03 DIAGNOSIS — R69 Illness, unspecified: Secondary | ICD-10-CM | POA: Diagnosis not present

## 2019-02-03 NOTE — Assessment & Plan Note (Signed)
Started about 20 years ago with lower back pain, then right hip/knee pain, 01/07/19 Ortho Xray, Medrol dose pk, s/p inj, no significant improvement. Continue to f/u Ortho, continue Tylenol and Gabapentin.

## 2019-02-03 NOTE — Assessment & Plan Note (Signed)
01/26/19 life long, on Trazodone 25mg  qd, Psych [employer], no suggestion for med

## 2019-02-03 NOTE — Assessment & Plan Note (Signed)
Lower back, R hip, R knee pain, positional, duration is about 20s years, f/u neurology.

## 2019-02-03 NOTE — Progress Notes (Signed)
Location:  [facility] Room Number: [place] of Service:  ALF [phone]) Provider:  Bucky Grigg, Lennie Odor  NP  Deland Pretty, MD  Patient Care Team: Deland Pretty, MD as PCP - General (Internal Medicine) Gatha Mayer, MD as Consulting Physician (Gastroenterology) [name], [name], MD as Consulting Physician (Cardiology) [name], MD as Consulting Physician (Internal Medicine)  Extended Emergency Contact Information Primary Emergency Contact: Wack,[name] Address: [address]          [place], [place] [address] [name] of [place] Phone: [phone] Mobile Phone: [phone] Relation: Daughter Secondary Emergency Contact: Ansell,[name] Address: [place] [address] [place] Mobile Phone: [phone] Relation: Daughter  Code Status:  Full Code Goals of care: Advanced Directive information Advanced Directives 02/03/2019  Does Patient Have a Medical Advance Directive? Yes  Type of Advance Directive Living will  Does patient want to make changes to medical advance directive? No - Patient declined  Copy of [place] in Chart? -  Pre-existing out of facility DNR order (yellow form or pink MOST form) -     Chief Complaint  Patient presents with  . Medical Management of Chronic Issues    HPI:  Pt is a 85 y.o. [name] seen today for medical management of chronic diseases.     The patient resides in [facility] for safety and care assistance, ambulates with walker. Hx of chronic back/R hip/R knee pain, positional, s/p Ortho evaluation, inj. On Gabapentin 100mg  qd, Tylenol 650mg  q8hr. HTN, blood pressure is controlled on Lisinopril 20mg  qd, Furosemide 40mg  qd, Amlodipine 10mg  qd. His mood is stable, on Trazodone 25mg  qd. Anemia, taking Vit B12 1064mcg qd, last Hgb 12.8 11/04/18. CHF compensated clinically, on Furosemide 40mg  qd.    Past Medical History:  Diagnosis Date  . Allergic rhinitis    . Amnesia   . BPH (benign prostatic hyperplasia)   . Bradycardia   . Colon polyps   . Coronary artery disease    MODERATE  . Dizziness   . Edema of lower extremity   . Erectile dysfunction   . H. pylori infection    Hx of   . Hard of hearing   . Headache(784.0)   . History of colon polyps 2009   Dr. Orr/Colonoscopy -small cecal adenoma  . Hx of colonoscopy 2009   Dr Lajoyce Corners, hemorrhoids & polyps  . Hyperlipidemia   . Hypertension   . Internal hemorrhoids 2009   Dr. Lajoyce Corners Colonoscopy  . Mitral valve regurgitation   . Reflux esophagitis    Past Surgical History:  Procedure Laterality Date  . APPENDECTOMY    . CARDIAC CATHETERIZATION  1997   REVEALED MILD TO MODERATE IRREGULARITIES. HIS LEFT EVNTRICULAR SYSTOLIC FUNCTION REVEALED NORMAL EJECTION FRACTION WITH EF OF 70%  . CATARACT EXTRACTION, BILATERAL    . COLONOSCOPY  multiple  . fatty tumor removal     benign  . HEMORROIDECTOMY    . HIP ARTHROPLASTY Left 08/17/2013   Procedure: ARTHROPLASTY BIPOLAR HIP;  Surgeon: Gearlean Alf, MD;  Location: [facility];  Service: Orthopedics;  Laterality: Left;  . INGUINAL HERNIA REPAIR Bilateral 2006  . lower back surgery  2006  . NASAL SEPTUM SURGERY    . TONSILLECTOMY    . UPPER GASTROINTESTINAL ENDOSCOPY      No Known Allergies  Outpatient Encounter Medications as of 02/03/2019  Medication Sig  . acetaminophen (TYLENOL) 500 MG  tablet Take 500 mg by mouth every 8 (eight) hours as needed.   Marland Kitchen acetaminophen (TYLENOL) 650 MG CR tablet Take 650 mg by mouth every 8 (eight) hours.  Marland Kitchen amLODipine (NORVASC) 10 MG tablet Take 1 tablet (10 mg total) by mouth daily.  . Cholecalciferol 25 MCG (1000 UT) capsule Take 1,000 Units by mouth daily.  Marland Kitchen DEXTRAN 70-HYPROMELLOSE, PF, OP Place 1 drop into both eyes daily as needed.  . furosemide (LASIX) 40 MG tablet Take 40 mg by mouth.  Derrill Memo ON 02/09/2019] gabapentin (NEURONTIN) 100 MG capsule Take 100 mg by mouth at bedtime.  . hydrocortisone  (ANUSOL-HC) 2.5 % rectal cream Place 1 application rectally daily as needed for hemorrhoids or anal itching. With perineal applicator  . lisinopril (ZESTRIL) 20 MG tablet Take 20 mg by mouth daily.  . Multiple Vitamin (MULTIVITAMIN) tablet Take 1 tablet by mouth daily.    . niacin 500 MG tablet Take 500 mg by mouth daily. On Mon, Tue, Thu, Sat  . Omega-3 Fatty Acids (FISH OIL) 1000 MG CAPS Take 1 capsule by mouth daily.  . potassium chloride (KLOR-CON) 20 MEQ packet Take 20 mEq by mouth daily.  . rosuvastatin (CRESTOR) 5 MG tablet Take 5 mg by mouth. Takes rarely  . sodium chloride (OCEAN) 0.65 % SOLN nasal spray Place 1 spray into both nostrils daily as needed for congestion.  . traZODone (DESYREL) 50 MG tablet Take 0.5 tablets (25 mg total) by mouth at bedtime.  . vitamin B-12 (CYANOCOBALAMIN) 1000 MCG tablet Take 1,000 mcg by mouth daily.  . Zinc 50 MG TABS Take 100 mg by mouth daily. On Mon and Friday   No facility-administered encounter medications on file as of 02/03/2019.    ROS was provided with assistance of staff Review of Systems  Constitutional: Negative for activity change, appetite change, chills, diaphoresis, fatigue, fever and unexpected weight change.  HENT: Positive for hearing loss. Negative for congestion and voice change.   Respiratory: Negative for cough, shortness of breath and wheezing.   Cardiovascular: Negative for chest pain and leg swelling.  Gastrointestinal: Negative for abdominal distention, abdominal pain, constipation, diarrhea, nausea and vomiting.  Genitourinary: Negative for difficulty urinating, dysuria and urgency.  Musculoskeletal: Positive for arthralgias, back pain and gait problem.  Skin: Negative for color change and pallor.  Neurological: Negative for dizziness, speech difficulty, weakness and headaches.       Memory lapses  Psychiatric/Behavioral: Positive for dysphoric mood. Negative for agitation, behavioral problems, hallucinations and sleep  disturbance. The patient is not nervous/anxious.     Immunization History  Administered Date(s) Administered  . Influenza-Unspecified 04/08/2016  . Pneumococcal Conjugate-13 06/02/2013  . Pneumococcal Polysaccharide-23 04/07/2001   Pertinent  Health Maintenance Due  Topic Date Due  . INFLUENZA VACCINE  01/15/2019  . PNA vac Low Risk Adult  Completed   Fall Risk  11/03/2017 07/11/2016  Falls in the past year? Yes Yes  Number falls in past yr: 2 or more 2 or more  Injury with Fall? Yes Yes  Comment - broke toe on right foot  Risk Factor Category  High Fall Risk -  Follow up Education provided;Falls prevention discussed -   Functional Status Survey:    Vitals:   02/03/19 1008  BP: 130/80  Pulse: 78  Resp: 20  Temp: 98 F (36.7 C)  SpO2: 94%  Weight: 161 lb 3.2 oz (73.1 kg)  Height: 5\' 7"  (1.702 m)   Body mass index is 25.25 kg/m. Physical Exam Vitals  signs and nursing note reviewed.  Constitutional:      Appearance: Normal appearance. He is normal weight.  HENT:     Head: Normocephalic and atraumatic.     Nose: Nose normal.     Mouth/Throat:     Mouth: Mucous membranes are moist.  Eyes:     Extraocular Movements: Extraocular movements intact.     Conjunctiva/sclera: Conjunctivae normal.     Pupils: Pupils are equal, round, and reactive to light.  Neck:     Musculoskeletal: Normal range of motion and neck supple.  Cardiovascular:     Rate and Rhythm: Normal rate and regular rhythm.     Heart sounds: No murmur.  Pulmonary:     Breath sounds: No wheezing, rhonchi or rales.  Chest:     Chest wall: No tenderness.  Abdominal:     General: Bowel sounds are normal.     Palpations: Abdomen is soft.     Tenderness: There is no abdominal tenderness. There is no right CVA tenderness, left CVA tenderness, guarding or rebound.  Musculoskeletal:        General: Tenderness present.     Right lower leg: No edema.     Left lower leg: No edema.     Comments: Pain in the  lower back, R hip, R knee with position change and walking with walker.   Skin:    General: Skin is warm and dry.     Comments: Chronic venous insufficiency brownish skin changes BLE  Neurological:     General: No focal deficit present.     Mental Status: He is alert. Mental status is at baseline.     Cranial Nerves: No cranial nerve deficit.     Motor: No weakness.     Coordination: Coordination normal.     Gait: Gait abnormal.     Comments: Oriented to person, place  Psychiatric:        Mood and Affect: Mood normal.        Behavior: Behavior normal.        Thought Content: Thought content normal.        Judgment: Judgment normal.     Labs reviewed: Recent Labs    11/01/18 2348 11/02/18 0222 11/03/18 0337 11/04/18 0312 11/30/18  NA  --  134* 136 136 138  K  --  3.3* 3.7 3.3* 4.1  CL  --  99 101 103  --   CO2  --  24 24 23   --   GLUCOSE  --  114* 122* 107*  --   BUN  --  10 13 9 13   CREATININE  --  0.87 0.98 0.94 1.0  CALCIUM  --  8.7* 9.2 9.1  --   MG 1.8  --   --  1.8  --    Recent Labs    11/01/18 1624 11/04/18 0312  AST 31 26  ALT 24 22  ALKPHOS 64 48  BILITOT 1.2 1.8*  PROT 6.6 5.8*  ALBUMIN 4.1 3.5   Recent Labs    11/01/18 1405 11/02/18 0222 11/03/18 0337 11/04/18 0312 11/30/18  WBC 16.9* 12.8* 10.8* 9.0 5.5  NEUTROABS 15.0*  --   --  6.0  --   HGB 14.9 12.9* 13.0 12.8* 14.0  HCT 42.9 36.7* 37.3* 36.8* 41  MCV 94.3 93.6 93.3 92.5  --   PLT 223 192 202 186 200   Lab Results  Component Value Date   TSH 1.504 11/01/2018   No results found  for: HGBA1C Lab Results  Component Value Date   CHOL 134 01/22/2019   HDL 57 01/22/2019   LDLCALC 63 01/22/2019   TRIG 59 01/22/2019   CHOLHDL 2.7 02/17/2017    Significant Diagnostic Results in last 30 days:  Xr C-arm No Report  Result Date: 01/24/2019 Please see Notes tab for imaging impression.  Xr Hip Unilat W Or W/o Pelvis 2-3 Views Right  Result Date: 01/07/2019 No acute or structural  abnormalities   Assessment/Plan Right hip pain Started about 20 years ago with lower back pain, then right hip/knee pain, 01/07/19 Ortho Xray, Medrol dose pk, s/p inj, no significant improvement. Continue to f/u Ortho, continue Tylenol and Gabapentin.   Hypertension Blood pressure is controlled, continue Amlodipine, Lisinopril, Furosemide   Chronic diastolic heart failure Compensated clinically, continue Furosemide 40mg  bid.   Generalized anxiety disorder 01/26/19 life long, on Trazodone 25mg  qd, Psych [employer], no suggestion for med   Anemia Continue Vit B12 1069mcg qd, last Hgb 12.8 11/04/18.  Lower back pain Lower back, R hip, R knee pain, positional, duration is about 20s years, f/u neurology.      Family/ staff Communication: plan of care reviewed with the patient and charge nurse.   Labs/tests ordered: none  Time spend 40 minutes.

## 2019-02-03 NOTE — Assessment & Plan Note (Signed)
Compensated clinically, continue Furosemide 40mg  bid.

## 2019-02-03 NOTE — Assessment & Plan Note (Signed)
Continue Vit B12 1071mcg qd, last Hgb 12.8 11/04/18.

## 2019-02-03 NOTE — Assessment & Plan Note (Signed)
Blood pressure is controlled, continue Amlodipine, Lisinopril, Furosemide

## 2019-02-10 ENCOUNTER — Encounter: Payer: Self-pay | Admitting: Internal Medicine

## 2019-02-10 ENCOUNTER — Non-Acute Institutional Stay: Payer: Medicare HMO | Admitting: Internal Medicine

## 2019-02-10 DIAGNOSIS — M5441 Lumbago with sciatica, right side: Secondary | ICD-10-CM

## 2019-02-10 DIAGNOSIS — I5032 Chronic diastolic (congestive) heart failure: Secondary | ICD-10-CM

## 2019-02-10 NOTE — Progress Notes (Signed)
Location:  [facility] Room Number: [place] of Service:  ALF [phone]) Provider:  [name]  MD  [name], MD  Patient Care Team: [name], MD as PCP - General (Internal Medicine) Gatha Mayer, MD as Consulting Physician (Gastroenterology) [name], [name], MD as Consulting Physician (Cardiology) [name], MD as Attending Physician (Internal Medicine) Mast, Man X, NP as Nurse Practitioner (Internal Medicine)  Extended Emergency Contact Information Primary Emergency Contact: Szczygiel,[name] Address: [address]          [place], [place] [address] [name] of [place] Phone: [phone] Mobile Phone: [phone] Relation: Daughter Secondary Emergency Contact: Axon,[name] Address: [place] [address] [place] Mobile Phone: [phone] Relation: Daughter  Code Status:  Full Code  Goals of care: Advanced Directive information Advanced Directives 02/03/2019  Does Patient Have a Medical Advance Directive? Yes  Type of Advance Directive Living will  Does patient want to make changes to medical advance directive? No - Patient declined  Copy of [place] in Chart? -  Pre-existing out of facility DNR order (yellow form or pink MOST form) -     Chief Complaint  Patient presents with   Acute Visit    HPI:  Pt is a 85 y.o. male seen today for an acute visit for Right Hip Pain  Patient has a history of BPH, bradycardia, CAD, hypertension, diastolic CHF, GERD,H/o Left Sphenoid Meningioma and recently was in the hospital for Multifocal pneumonia and Acute Metabolic Encephalopathy He was admitted to SNF for therapy and now is transferred to [facility] for Long term Care  He has been having chronic Right Hip Pain for past few Weeks. Was seen by Orthopedics and Thinking was that his pain is related to L2-3 and L4-5 Severe Stenosis as per MRI done in 2019. He was started  on Medrol pack which patient says seemed to help. He also underwent Epidural Injection in his spine on 08/10. Which helped for few days but now pain is back. His Xray in Orthopedics office did not show any acute process. Xray in Facility also showed Mild arthritis.  He keeps c/o Pain in his Right hip and inability to walk due to this. Is very upset as he says nothing is helping.  Past Medical History:  Diagnosis Date   Allergic rhinitis    Amnesia    BPH (benign prostatic hyperplasia)    Bradycardia    Colon polyps    Coronary artery disease    MODERATE   Dizziness    Edema of lower extremity    Erectile dysfunction    H. pylori infection    Hx of    Hard of hearing    Headache(784.0)    History of colon polyps 2009   Dr. Orr/Colonoscopy -small cecal adenoma   Hx of colonoscopy 2009   Dr Lajoyce Corners, hemorrhoids & polyps   Hyperlipidemia    Hypertension    Internal hemorrhoids 2009   Dr. Lajoyce Corners Colonoscopy   Mitral valve regurgitation    Reflux esophagitis    Past Surgical History:  Procedure Laterality Date   APPENDECTOMY     CARDIAC CATHETERIZATION  1997   REVEALED MILD TO MODERATE IRREGULARITIES. HIS LEFT EVNTRICULAR SYSTOLIC FUNCTION REVEALED NORMAL EJECTION FRACTION WITH EF OF 70%   CATARACT EXTRACTION, BILATERAL     COLONOSCOPY  multiple   fatty tumor removal  benign   HEMORROIDECTOMY     HIP ARTHROPLASTY Left 08/17/2013   Procedure: ARTHROPLASTY BIPOLAR HIP;  Surgeon: Gearlean Alf, MD;  Location: [facility];  Service: Orthopedics;  Laterality: Left;   INGUINAL HERNIA REPAIR Bilateral 2006   lower back surgery  2006   NASAL SEPTUM SURGERY     TONSILLECTOMY     UPPER GASTROINTESTINAL ENDOSCOPY      No Known Allergies  Outpatient Encounter Medications as of 02/10/2019  Medication Sig   acetaminophen (TYLENOL) 500 MG tablet Take 500 mg by mouth every 8 (eight) hours as needed.    acetaminophen (TYLENOL) 650 MG CR tablet Take 650 mg by  mouth 2 (two) times daily.    amLODipine (NORVASC) 10 MG tablet Take 1 tablet (10 mg total) by mouth daily.   Cholecalciferol 25 MCG (1000 UT) capsule Take 1,000 Units by mouth daily.   DEXTRAN 70-HYPROMELLOSE, PF, OP Place 1 drop into both eyes daily as needed.   furosemide (LASIX) 40 MG tablet Take 40 mg by mouth.   gabapentin (NEURONTIN) 100 MG capsule Take 100 mg by mouth at bedtime.   hydrocortisone (ANUSOL-HC) 2.5 % rectal cream Place 1 application rectally daily as needed for hemorrhoids or anal itching. With perineal applicator   lisinopril (ZESTRIL) 20 MG tablet Take 20 mg by mouth daily.   Multiple Vitamin (MULTIVITAMIN) tablet Take 1 tablet by mouth daily.     niacin 500 MG tablet Take 500 mg by mouth daily. On Mon, Tue, Thu, Sat   Omega-3 Fatty Acids (FISH OIL) 1000 MG CAPS Take 1 capsule by mouth daily.   potassium chloride (KLOR-CON) 20 MEQ packet Take 20 mEq by mouth daily.   rosuvastatin (CRESTOR) 5 MG tablet Take 5 mg by mouth. Takes rarely   sodium chloride (OCEAN) 0.65 % SOLN nasal spray Place 1 spray into both nostrils daily as needed for congestion.   traZODone (DESYREL) 50 MG tablet Take 0.5 tablets (25 mg total) by mouth at bedtime.   vitamin B-12 (CYANOCOBALAMIN) 1000 MCG tablet Take 1,000 mcg by mouth daily.   Zinc 50 MG TABS Take 100 mg by mouth daily. On Mon and Friday   No facility-administered encounter medications on file as of 02/10/2019.     Review of Systems  Constitutional: Positive for activity change.  HENT: Negative.   Respiratory: Negative.   Cardiovascular: Positive for leg swelling.  Gastrointestinal: Negative.   Genitourinary: Negative.   Musculoskeletal: Positive for arthralgias, gait problem and myalgias.  Skin: Negative.   Neurological: Positive for weakness.  Psychiatric/Behavioral: Positive for agitation and dysphoric mood.    Immunization History  Administered Date(s) Administered   Influenza-Unspecified 04/08/2016     Pneumococcal Conjugate-13 06/02/2013   Pneumococcal Polysaccharide-23 04/07/2001   Pertinent  Health Maintenance Due  Topic Date Due   INFLUENZA VACCINE  01/15/2019   PNA vac Low Risk Adult  Completed   Fall Risk  11/03/2017 07/11/2016  Falls in the past year? Yes Yes  Number falls in past yr: 2 or more 2 or more  Injury with Fall? Yes Yes  Comment - broke toe on right foot  Risk Factor Category  High Fall Risk -  Follow up Education provided;Falls prevention discussed -   Functional Status Survey:    Vitals:   02/10/19 0929  BP: 140/70  Pulse: 76  Resp: 20  Temp: 98.5 F (36.9 C)  SpO2: 98%  Weight: 162 lb 3.2 oz (73.6 kg)  Height: 5\' 7"  (1.702 m)   Body  mass index is 25.4 kg/m. Physical Exam Vitals signs reviewed.  Constitutional:      Appearance: Normal appearance.  HENT:     Head: Normocephalic.     Nose: Nose normal.     Mouth/Throat:     Mouth: Mucous membranes are moist.     Pharynx: Oropharynx is clear.  Eyes:     Pupils: Pupils are equal, round, and reactive to light.  Neck:     Musculoskeletal: Neck supple.  Cardiovascular:     Rate and Rhythm: Normal rate and regular rhythm.  Pulmonary:     Effort: Pulmonary effort is normal.     Breath sounds: Normal breath sounds.  Abdominal:     General: Abdomen is flat. Bowel sounds are normal.     Palpations: Abdomen is soft.  Musculoskeletal:        General: Swelling present.     Comments: Had Pain in Lower back going down his Right Leg. Also c/o Pain in Hip when walks  Skin:    General: Skin is warm and dry.  Neurological:     General: No focal deficit present.     Mental Status: He is alert.  Psychiatric:        Mood and Affect: Mood normal.        Thought Content: Thought content normal.     Labs reviewed: Recent Labs    11/01/18 2348 11/02/18 0222 11/03/18 0337 11/04/18 0312 11/30/18  NA  --  134* 136 136 138  K  --  3.3* 3.7 3.3* 4.1  CL  --  99 101 103  --   CO2  --  24 24 23    --   GLUCOSE  --  114* 122* 107*  --   BUN  --  10 13 9 13   CREATININE  --  0.87 0.98 0.94 1.0  CALCIUM  --  8.7* 9.2 9.1  --   MG 1.8  --   --  1.8  --    Recent Labs    11/01/18 1624 11/04/18 0312  AST 31 26  ALT 24 22  ALKPHOS 64 48  BILITOT 1.2 1.8*  PROT 6.6 5.8*  ALBUMIN 4.1 3.5   Recent Labs    11/01/18 1405 11/02/18 0222 11/03/18 0337 11/04/18 0312 11/30/18  WBC 16.9* 12.8* 10.8* 9.0 5.5  NEUTROABS 15.0*  --   --  6.0  --   HGB 14.9 12.9* 13.0 12.8* 14.0  HCT 42.9 36.7* 37.3* 36.8* 41  MCV 94.3 93.6 93.3 92.5  --   PLT 223 192 202 186 200   Lab Results  Component Value Date   TSH 1.504 11/01/2018   No results found for: HGBA1C Lab Results  Component Value Date   CHOL 134 01/22/2019   HDL 57 01/22/2019   LDLCALC 63 01/22/2019   TRIG 59 01/22/2019   CHOLHDL 2.7 02/17/2017    Significant Diagnostic Results in last 30 days:  Xr C-arm No Report  Result Date: 01/24/2019 Please see Notes tab for imaging impression.   Assessment/Plan Pain in Right Hip ? Lower back Pain due to Lumbar stenosis Per Ortho it is pain radiating from his Lower back. Medrol Pack helped for little while . Same for his Epidural injection which helped. But now the pain is back. He is going to see Neurology in oct as he thinks that they can help him D/W him and Nurses Started him on Meloxicam 15 mg QD Increased his Neurontin to 200 mg QHS Also  Started him On Robaxin 250 mg QHS PRN Reval in few days to see if he will need titration of these meds    Family/ staff Communication:   Labs/tests ordered:  Total time spent in this patient care encounter was  25_  minutes; greater than 50% of the visit spent counseling patient and staff, reviewing records , Labs and coordinating care for problems addressed at this encounter.

## 2019-02-22 ENCOUNTER — Non-Acute Institutional Stay: Payer: Medicare HMO | Admitting: Nurse Practitioner

## 2019-02-22 ENCOUNTER — Encounter: Payer: Self-pay | Admitting: Nurse Practitioner

## 2019-02-22 DIAGNOSIS — R609 Edema, unspecified: Secondary | ICD-10-CM

## 2019-02-22 DIAGNOSIS — M25551 Pain in right hip: Secondary | ICD-10-CM | POA: Diagnosis not present

## 2019-02-22 DIAGNOSIS — R296 Repeated falls: Secondary | ICD-10-CM | POA: Insufficient documentation

## 2019-02-22 DIAGNOSIS — W19XXXA Unspecified fall, initial encounter: Secondary | ICD-10-CM

## 2019-02-22 NOTE — Assessment & Plan Note (Addendum)
Stable, continue Furosemide 74m qd. Update CBC/diff, CMP/eGFR.

## 2019-02-22 NOTE — Progress Notes (Signed)
Location:  [facility] Room Number: [place] of Service:  ALF [phone]) Provider:  Marlana Latus  NP  [name], MD  Patient Care Team: [name], MD as PCP - General (Internal Medicine) Gatha Mayer, MD as Consulting Physician (Gastroenterology) [name], [name], MD as Consulting Physician (Cardiology) [name], MD as Attending Physician (Internal Medicine) Mast, Man X, NP as Nurse Practitioner (Internal Medicine)  Extended Emergency Contact Information Primary Emergency Contact: Engelbert,[name] Address: [address]          [place], [place] [address] [name] of [place] Phone: [phone] Mobile Phone: [phone] Relation: Daughter Secondary Emergency Contact: Hofstra,[name] Address: [place] [address] [place] Mobile Phone: [phone] Relation: Daughter  Code Status:  Full Code Goals of care: Advanced Directive information Advanced Directives 02/22/2019  Does Patient Have a Medical Advance Directive? Yes  Type of Advance Directive Living will;Healthcare Power of Attorney  Does patient want to make changes to medical advance directive? No - Patient declined  Copy of [place] in Chart? Yes - validated most recent copy scanned in chart (See row information)  Pre-existing out of facility DNR order (yellow form or pink MOST form) -     Chief Complaint  Patient presents with  . Acute Visit    C/o - Fall    HPI:  Pt is a 85 y.o. male seen today for an acute visit for fall 02/21/19, the patient stated he slid off bed. Right lower back/hip/leg pain remains no change, on Meloxicam 61m qd, Tylenol 6558mbid, Gabapentin 20067md, Methocarbamol 250m67m, qd prn. Pending neurology consultation 03/2019. BLE edema, stable, on Furosemide 40mg20m    Past Medical History:  Diagnosis Date  . Allergic rhinitis   . Amnesia   . BPH (benign prostatic hyperplasia)   .  Bradycardia   . Colon polyps   . Coronary artery disease    MODERATE  . Dizziness   . Edema of lower extremity   . Erectile dysfunction   . H. pylori infection    Hx of   . Hard of hearing   . Headache(784.0)   . History of colon polyps 2009   Dr. Orr/Colonoscopy -small cecal adenoma  . Hx of colonoscopy 2009   Dr Orr, Lajoyce Cornersorrhoids & polyps  . Hyperlipidemia   . Hypertension   . Internal hemorrhoids 2009   Dr. Orr/ Lajoyce Cornersnoscopy  . Mitral valve regurgitation   . Reflux esophagitis    Past Surgical History:  Procedure Laterality Date  . APPENDECTOMY    . CARDIAC CATHETERIZATION  1997   REVEALED MILD TO MODERATE IRREGULARITIES. HIS LEFT EVNTRICULAR SYSTOLIC FUNCTION REVEALED NORMAL EJECTION FRACTION WITH EF OF 70%  . CATARACT EXTRACTION, BILATERAL    . COLONOSCOPY  multiple  . fatty tumor removal     benign  . HEMORROIDECTOMY    . HIP ARTHROPLASTY Left 08/17/2013   Procedure: ARTHROPLASTY BIPOLAR HIP;  Surgeon: FrankGearlean Alf  Location: [facility];  Service: Orthopedics;  Laterality: Left;  . INGUINAL HERNIA REPAIR Bilateral 2006  . lower back surgery  2006  . NASAL SEPTUM SURGERY    . TONSILLECTOMY    . UPPER GASTROINTESTINAL ENDOSCOPY      No Known Allergies  Outpatient Encounter Medications as of 02/22/2019  Medication Sig  . acetaminophen (TYLENOL 8 HOUR) 650 MG CR tablet Take 650 mg by  mouth 2 (two) times daily.  Marland Kitchen acetaminophen (TYLENOL) 500 MG tablet Take 500 mg by mouth every 8 (eight) hours as needed.   Marland Kitchen amLODipine (NORVASC) 10 MG tablet Take 1 tablet (10 mg total) by mouth daily.  . Cholecalciferol 25 MCG (1000 UT) capsule Take 1,000 Units by mouth daily.  Marland Kitchen DEXTRAN 70-HYPROMELLOSE OP Apply 1 drop to eye daily as needed.  . furosemide (LASIX) 40 MG tablet Take 40 mg by mouth.  . gabapentin (NEURONTIN) 100 MG capsule Take 200 mg by mouth at bedtime.  . hydrocortisone (ANUSOL-HC) 2.5 % rectal cream Place 1 application rectally daily as needed for hemorrhoids or  anal itching. With perineal applicator  . lisinopril (ZESTRIL) 20 MG tablet Take 20 mg by mouth daily.  . meloxicam (MOBIC) 15 MG tablet Take 15 mg by mouth daily.  . methocarbamol (ROBAXIN) 500 MG tablet Take 250 mg by mouth daily. Per Georg Ruddle  . methocarbamol (ROBAXIN) 500 MG tablet Take 250 mg by mouth daily as needed for muscle spasms. Administer 257m po daily PRN per TGeorg Ruddle . Multiple Vitamin (MULTIVITAMIN) tablet Take 1 tablet by mouth daily.    . Omega-3 Fatty Acids (FISH OIL) 1000 MG CAPS Take 1 capsule by mouth daily.  . potassium chloride (KLOR-CON) 20 MEQ packet Take 20 mEq by mouth daily.  . rosuvastatin (CRESTOR) 5 MG tablet Take 5 mg by mouth daily. Takes rarely   . sodium chloride (DEEP SEA NASAL SPRAY) 0.65 % nasal spray Place 1 spray into the nose daily as needed for congestion.  . traZODone (DESYREL) 50 MG tablet Take 0.5 tablets (25 mg total) by mouth at bedtime.  . vitamin B-12 (CYANOCOBALAMIN) 1000 MCG tablet Take 1,000 mcg by mouth daily.  . Zinc 50 MG TABS Take 100 mg by mouth daily. On Mon and Friday  . [DISCONTINUED] acetaminophen (TYLENOL) 650 MG CR tablet Take 650 mg by mouth 2 (two) times daily.   . [DISCONTINUED] DEXTRAN 70-HYPROMELLOSE, PF, OP Place 1 drop into both eyes daily as needed.  . [DISCONTINUED] gabapentin (NEURONTIN) 100 MG capsule Take 100 mg by mouth at bedtime.  . [DISCONTINUED] niacin 500 MG tablet Take 500 mg by mouth daily. On Mon, Tue, Thu, Sat  . [DISCONTINUED] sodium chloride (OCEAN) 0.65 % SOLN nasal spray Place 1 spray into both nostrils daily as needed for congestion.   No facility-administered encounter medications on file as of 02/22/2019.     Review of Systems  Constitutional: Negative for activity change, appetite change, chills, diaphoresis, fatigue and fever.  HENT: Positive for hearing loss. Negative for congestion and voice change.   Eyes: Negative for visual disturbance.  Respiratory: Negative for cough, shortness of  breath and wheezing.   Cardiovascular: Positive for leg swelling.  Gastrointestinal: Negative for abdominal distention, abdominal pain, constipation, diarrhea, nausea and vomiting.  Genitourinary: Negative for difficulty urinating, dysuria and urgency.  Musculoskeletal: Positive for arthralgias, back pain and gait problem.       Right lower back, right hip, right leg pain is chronic   Skin: Positive for color change. Negative for pallor.       Brownish skin discoloration  Neurological: Negative for dizziness, speech difficulty, weakness and headaches.  Psychiatric/Behavioral: Positive for dysphoric mood. Negative for agitation, behavioral problems, hallucinations and sleep disturbance. The patient is not nervous/anxious.     Immunization History  Administered Date(s) Administered  . Influenza-Unspecified 04/08/2016  . Pneumococcal Conjugate-13 06/02/2013  . Pneumococcal Polysaccharide-23 04/07/2001   Pertinent  Health Maintenance  Due  Topic Date Due  . INFLUENZA VACCINE  01/15/2019  . PNA vac Low Risk Adult  Completed   Fall Risk  11/03/2017 07/11/2016  Falls in the past year? Yes Yes  Number falls in past yr: 2 or more 2 or more  Injury with Fall? Yes Yes  Comment - broke toe on right foot  Risk Factor Category  High Fall Risk -  Follow up Education provided;Falls prevention discussed -   Functional Status Survey:    Vitals:   02/22/19 1043  BP: (!) 132/58  Pulse: 62  Resp: 18  Temp: 98.2 F (36.8 C)  SpO2: 98%  Weight: 164 lb 9.6 oz (74.7 kg)  Height: 5' 7"  (1.702 m)   Body mass index is 25.78 kg/m. Physical Exam Constitutional:      General: He is not in acute distress.    Appearance: Normal appearance. He is not ill-appearing, toxic-appearing or diaphoretic.  HENT:     Head: Normocephalic and atraumatic.     Nose: Nose normal.     Mouth/Throat:     Mouth: Mucous membranes are moist.  Eyes:     Extraocular Movements: Extraocular movements intact.      Conjunctiva/sclera: Conjunctivae normal.     Pupils: Pupils are equal, round, and reactive to light.  Neck:     Musculoskeletal: Normal range of motion and neck supple.  Cardiovascular:     Rate and Rhythm: Normal rate and regular rhythm.     Heart sounds: No murmur.  Pulmonary:     Breath sounds: No wheezing, rhonchi or rales.  Abdominal:     General: Bowel sounds are normal.     Palpations: Abdomen is soft.     Tenderness: There is no abdominal tenderness. There is no right CVA tenderness, left CVA tenderness, guarding or rebound.  Musculoskeletal:     Right lower leg: Edema present.     Left lower leg: Edema present.     Comments: Trace edema BLE.   Skin:    General: Skin is warm and dry.     Comments: Brownish venous insufficiency skin changes BLE  Neurological:     General: No focal deficit present.     Mental Status: He is alert. Mental status is at baseline.     Cranial Nerves: No cranial nerve deficit.     Motor: No weakness.     Gait: Gait abnormal.     Comments: Oriented to person, place.   Psychiatric:        Mood and Affect: Mood normal.        Behavior: Behavior normal.        Thought Content: Thought content normal.     Labs reviewed: Recent Labs    11/01/18 2348 11/02/18 0222 11/03/18 0337 11/04/18 0312 11/30/18  NA  --  134* 136 136 138  K  --  3.3* 3.7 3.3* 4.1  CL  --  99 101 103  --   CO2  --  24 24 23   --   GLUCOSE  --  114* 122* 107*  --   BUN  --  10 13 9 13   CREATININE  --  0.87 0.98 0.94 1.0  CALCIUM  --  8.7* 9.2 9.1  --   MG 1.8  --   --  1.8  --    Recent Labs    11/01/18 1624 11/04/18 0312  AST 31 26  ALT 24 22  ALKPHOS 64 48  BILITOT 1.2 1.8*  PROT 6.6 5.8*  ALBUMIN 4.1 3.5   Recent Labs    11/01/18 1405 11/02/18 0222 11/03/18 0337 11/04/18 0312 11/30/18  WBC 16.9* 12.8* 10.8* 9.0 5.5  NEUTROABS 15.0*  --   --  6.0  --   HGB 14.9 12.9* 13.0 12.8* 14.0  HCT 42.9 36.7* 37.3* 36.8* 41  MCV 94.3 93.6 93.3 92.5  --   PLT  223 192 202 186 200   Lab Results  Component Value Date   TSH 1.504 11/01/2018   No results found for: HGBA1C Lab Results  Component Value Date   CHOL 134 01/22/2019   HDL 57 01/22/2019   LDLCALC 63 01/22/2019   TRIG 59 01/22/2019   CHOLHDL 2.7 02/17/2017    Significant Diagnostic Results in last 30 days:  Xr C-arm No Report  Result Date: 01/24/2019 Please see Notes tab for imaging impression.   Assessment/Plan Fall Slid off bed, no apparent injury, safety awareness encouraged. Observe.   Right hip pain 01/07/19 Fredrich Birks, Medrol dose pk.  Pending neurology consultation 03/2019, continue Gabapentin 292m qd, Methocarbamol 2578mqd, qd prn. Meloxicam 1538md, Tylenol 650m23md.   Edema Stable, continue Furosemide 40mg54m Update CBC/diff, CMP/eGFR.      Family/ staff Communication: plan of care reviewed with the patient and charge nurse.   Labs/tests ordered:  CBC/diff, CMP/eGFR  Time spend 40 minutes.

## 2019-02-22 NOTE — Assessment & Plan Note (Signed)
Slid off bed, no apparent injury, safety awareness encouraged. Observe.

## 2019-02-22 NOTE — Assessment & Plan Note (Signed)
01/07/19 [name] [name], Medrol dose pk.  Pending neurology consultation 03/2019, continue Gabapentin 200mg  qd, Methocarbamol 250mg  qd, qd prn. Meloxicam 15mg  qd, Tylenol 650mg  bid.

## 2019-02-24 DIAGNOSIS — J189 Pneumonia, unspecified organism: Secondary | ICD-10-CM | POA: Diagnosis not present

## 2019-02-28 ENCOUNTER — Encounter: Payer: Self-pay | Admitting: Nurse Practitioner

## 2019-02-28 ENCOUNTER — Non-Acute Institutional Stay: Payer: Medicare HMO | Admitting: Nurse Practitioner

## 2019-02-28 DIAGNOSIS — M5441 Lumbago with sciatica, right side: Secondary | ICD-10-CM

## 2019-02-28 DIAGNOSIS — R269 Unspecified abnormalities of gait and mobility: Secondary | ICD-10-CM

## 2019-02-28 DIAGNOSIS — I10 Essential (primary) hypertension: Secondary | ICD-10-CM

## 2019-02-28 DIAGNOSIS — W19XXXA Unspecified fall, initial encounter: Secondary | ICD-10-CM

## 2019-02-28 DIAGNOSIS — R609 Edema, unspecified: Secondary | ICD-10-CM

## 2019-02-28 DIAGNOSIS — I5032 Chronic diastolic (congestive) heart failure: Secondary | ICD-10-CM

## 2019-02-28 NOTE — Assessment & Plan Note (Signed)
Persisted edema BLE, continue Furosemide 40mg  qd.

## 2019-02-28 NOTE — Assessment & Plan Note (Signed)
Uses walker, w/c for mobility.

## 2019-02-28 NOTE — Assessment & Plan Note (Addendum)
Controlled BP, elevated Sbp was related to fall, re checked 132/68, he denied HA, dizziness, change of vision, chest pain/pressrue, or palpitation, continue Lisinopril 20mg  qd, Amlodipine 10mg  qd.

## 2019-02-28 NOTE — Assessment & Plan Note (Addendum)
x2 in the past 2 weeks, lost balance, lacking of safety awareness, increased frailty are contributory, close supervision for safety, update CBC/diff, CMP/eGFR, monitor for s/s of UTI, obtain MMSE

## 2019-02-28 NOTE — Progress Notes (Addendum)
Location:  [facility] Room Number: [place] of Service:  ALF [phone]) Provider:  Ethyn Schetter Otho Darner, NP  [name], MD  Patient Care Team: [name], MD as PCP - General (Internal Medicine) Gatha Mayer, MD as Consulting Physician (Gastroenterology) [name], [name], MD as Consulting Physician (Cardiology) [name], MD as Attending Physician (Internal Medicine) Myrtis Maille X, NP as Nurse Practitioner (Internal Medicine)  Extended Emergency Contact Information Primary Emergency Contact: Dyk,[name] Address: [address]          [place], [place] [address] [name] of [place] Phone: [phone] Mobile Phone: [phone] Relation: Daughter Secondary Emergency Contact: Warnell,[name] Address: [place] [address] [place] Mobile Phone: [phone] Relation: Daughter  Code Status: FULL Goals of care: Advanced Directive information Advanced Directives 02/28/2019  Does Patient Have a Medical Advance Directive? Yes  Type of Paramedic of [place];Living will  Does patient want to make changes to medical advance directive? No - Patient declined  Copy of [place] in Chart? Yes - validated most recent copy scanned in chart (See row information)  Pre-existing out of facility DNR order (yellow form or pink MOST form) -     Chief Complaint  Patient presents with  . Acute Visit    Fall     HPI:  Pt is a 85 y.o. male seen today for an acute visit  for fall, lost balance, no apparent injury 2x in the past 2 weeks. HPOA stated he may have UTI, the patient denied dysuria, blood in urine, suprapubic pain, he is afebrile. Urination 2-3x/night at his baseline, uses urinal. Right lower back/hip/leg pain, controlled on Methocarbamol 283m qd, Mobic 179mqd, Tylenol 65069mid,  Gabapentin 200m85m. HTN, blood pressure is controlled on Amlodipine 10mg106m  Lisnopril 20mg 49mCHF/edema, persisted, on Furosemide 40mg q74m  Past Medical History:  Diagnosis Date  . Allergic rhinitis   . Amnesia   . BPH (benign prostatic hyperplasia)   . Bradycardia   . Colon polyps   . Coronary artery disease    MODERATE  . Dizziness   . Edema of lower extremity   . Erectile dysfunction   . H. pylori infection    Hx of   . Hard of hearing   . Headache(784.0)   . History of colon polyps 2009   Dr. Orr/Colonoscopy -small cecal adenoma  . Hx of colonoscopy 2009   Dr Orr, heLajoyce Cornersrhoids & polyps  . Hyperlipidemia   . Hypertension   . Internal hemorrhoids 2009   Dr. Orr/ CoLajoyce Cornersscopy  . Mitral valve regurgitation   . Reflux esophagitis    Past Surgical History:  Procedure Laterality Date  . APPENDECTOMY    . CARDIAC CATHETERIZATION  1997   REVEALED MILD TO MODERATE IRREGULARITIES. HIS LEFT EVNTRICULAR SYSTOLIC FUNCTION REVEALED NORMAL EJECTION FRACTION WITH EF OF 70%  . CATARACT EXTRACTION, BILATERAL    . COLONOSCOPY  multiple  . fatty tumor removal     benign  . HEMORROIDECTOMY    . HIP ARTHROPLASTY Left 08/17/2013   Procedure: ARTHROPLASTY BIPOLAR HIP;  Surgeon: Frank [facility]earlean AlfLocation: [facility];  Service: Orthopedics;  Laterality: Left;  . INGUINAL HERNIA REPAIR Bilateral 2006  . lower back surgery  2006  . NASAL SEPTUM SURGERY    . TONSILLECTOMY    . UPPER GASTROINTESTINAL ENDOSCOPY  No Known Allergies  Outpatient Encounter Medications as of 02/28/2019  Medication Sig  . acetaminophen (TYLENOL 8 HOUR) 650 MG CR tablet Take 650 mg by mouth 2 (two) times daily.  Marland Kitchen acetaminophen (TYLENOL) 500 MG tablet Take 500 mg by mouth every 8 (eight) hours as needed.   Marland Kitchen amLODipine (NORVASC) 10 MG tablet Take 1 tablet (10 mg total) by mouth daily.  . Cholecalciferol 25 MCG (1000 UT) capsule Take 1,000 Units by mouth daily.  Marland Kitchen DEXTRAN 70-HYPROMELLOSE OP Apply 1 drop to eye daily as needed.  . furosemide (LASIX) 40 MG tablet Take 40 mg by mouth.   . gabapentin (NEURONTIN) 100 MG capsule Take 200 mg by mouth at bedtime.  . hydrocortisone (ANUSOL-HC) 2.5 % rectal cream Place 1 application rectally daily as needed for hemorrhoids or anal itching. With perineal applicator  . lisinopril (ZESTRIL) 20 MG tablet Take 20 mg by mouth daily.  . meloxicam (MOBIC) 15 MG tablet Take 15 mg by mouth daily.  . methocarbamol (ROBAXIN) 500 MG tablet Take 250 mg by mouth daily. Per Georg Ruddle  . methocarbamol (ROBAXIN) 500 MG tablet Take 250 mg by mouth daily as needed for muscle spasms. per Georg Ruddle  . Multiple Vitamin (MULTIVITAMIN) tablet Take 1 tablet by mouth daily.    . Omega-3 Fatty Acids (FISH OIL) 1000 MG CAPS Take 1 capsule by mouth daily.  . potassium chloride (KLOR-CON) 20 MEQ packet Take 20 mEq by mouth daily.  . rosuvastatin (CRESTOR) 5 MG tablet Take 5 mg by mouth daily. Takes rarely   . sodium chloride (DEEP SEA NASAL SPRAY) 0.65 % nasal spray Place 1 spray into the nose daily as needed for congestion.  . traZODone (DESYREL) 50 MG tablet Take 0.5 tablets (25 mg total) by mouth at bedtime.  . vitamin B-12 (CYANOCOBALAMIN) 1000 MCG tablet Take 1,000 mcg by mouth daily.  . Zinc 50 MG TABS Take 100 mg by mouth daily. On Mon and Friday   No facility-administered encounter medications on file as of 02/28/2019.    ROS was provided with assistance of staff.  Review of Systems  Constitutional: Negative for activity change, appetite change, chills, diaphoresis, fatigue and fever.  HENT: Positive for hearing loss. Negative for congestion and voice change.   Respiratory: Negative for cough, shortness of breath and wheezing.   Cardiovascular: Positive for leg swelling.  Gastrointestinal: Negative for abdominal distention, abdominal pain, constipation, diarrhea, nausea and vomiting.  Genitourinary: Positive for frequency. Negative for difficulty urinating, dysuria, hematuria and urgency.  Musculoskeletal: Positive for arthralgias, back pain and  gait problem.  Skin: Negative for color change and pallor.  Neurological: Negative for dizziness, facial asymmetry, speech difficulty, weakness and headaches.       Memory lapses.   Psychiatric/Behavioral: Positive for dysphoric mood. Negative for agitation, behavioral problems, hallucinations and sleep disturbance. The patient is not nervous/anxious.     Immunization History  Administered Date(s) Administered  . Influenza-Unspecified 04/08/2016  . Pneumococcal Conjugate-13 06/02/2013  . Pneumococcal Polysaccharide-23 04/07/2001   Pertinent  Health Maintenance Due  Topic Date Due  . INFLUENZA VACCINE  01/15/2019  . PNA vac Low Risk Adult  Completed   Fall Risk  11/03/2017 07/11/2016  Falls in the past year? Yes Yes  Number falls in past yr: 2 or more 2 or more  Injury with Fall? Yes Yes  Comment - broke toe on right foot  Risk Factor Category  High Fall Risk -  Follow up Education provided;Falls prevention discussed -  Functional Status Survey:    Vitals:   02/28/19 1010  BP: (!) 160/70  Pulse: 96  Resp: 20  Temp: 98.7 F (37.1 C)  TempSrc: Oral  SpO2: 94%  Weight: 164 lb 12.8 oz (74.8 kg)  Height: 5' 7"  (1.702 m)   Body mass index is 25.81 kg/m. Physical Exam Constitutional:      General: He is not in acute distress.    Appearance: Normal appearance. He is not ill-appearing, toxic-appearing or diaphoretic.  HENT:     Head: Normocephalic and atraumatic.     Nose: Nose normal.     Mouth/Throat:     Mouth: Mucous membranes are moist.  Eyes:     Extraocular Movements: Extraocular movements intact.     Conjunctiva/sclera: Conjunctivae normal.     Pupils: Pupils are equal, round, and reactive to light.  Neck:     Musculoskeletal: Normal range of motion and neck supple.  Cardiovascular:     Rate and Rhythm: Normal rate and regular rhythm.     Heart sounds: Murmur present.  Pulmonary:     Breath sounds: No wheezing, rhonchi or rales.  Abdominal:     General:  Bowel sounds are normal. There is no distension.     Palpations: Abdomen is soft.     Tenderness: There is no abdominal tenderness. There is no right CVA tenderness, left CVA tenderness or guarding.  Musculoskeletal:     Right lower leg: Edema present.     Left lower leg: Edema present.     Comments: 1+ edema BLE.   Skin:    General: Skin is warm and dry.     Comments: Chronic venous insufficiency skin changes BLE  Neurological:     General: No focal deficit present.     Mental Status: He is alert. Mental status is at baseline.     Cranial Nerves: No cranial nerve deficit.     Motor: No weakness.     Coordination: Coordination normal.     Gait: Gait abnormal.     Comments: Oriented to person, place.   Psychiatric:        Mood and Affect: Mood normal.        Behavior: Behavior normal.        Thought Content: Thought content normal.     Labs reviewed: Recent Labs    11/01/18 2348 11/02/18 0222 11/03/18 0337 11/04/18 0312 11/30/18  NA  --  134* 136 136 138  K  --  3.3* 3.7 3.3* 4.1  CL  --  99 101 103  --   CO2  --  24 24 23   --   GLUCOSE  --  114* 122* 107*  --   BUN  --  10 13 9 13   CREATININE  --  0.87 0.98 0.94 1.0  CALCIUM  --  8.7* 9.2 9.1  --   MG 1.8  --   --  1.8  --    Recent Labs    11/01/18 1624 11/04/18 0312  AST 31 26  ALT 24 22  ALKPHOS 64 48  BILITOT 1.2 1.8*  PROT 6.6 5.8*  ALBUMIN 4.1 3.5   Recent Labs    11/01/18 1405 11/02/18 0222 11/03/18 0337 11/04/18 0312 11/30/18  WBC 16.9* 12.8* 10.8* 9.0 5.5  NEUTROABS 15.0*  --   --  6.0  --   HGB 14.9 12.9* 13.0 12.8* 14.0  HCT 42.9 36.7* 37.3* 36.8* 41  MCV 94.3 93.6 93.3 92.5  --  PLT 223 192 202 186 200   Lab Results  Component Value Date   TSH 1.504 11/01/2018   No results found for: HGBA1C Lab Results  Component Value Date   CHOL 134 01/22/2019   HDL 57 01/22/2019   LDLCALC 63 01/22/2019   TRIG 59 01/22/2019   CHOLHDL 2.7 02/17/2017    Signi ficant Diagnostic Results in  last 30 days:   Assessment/Plan Fall x2 in the past 2 weeks, lost balance, lacking of safety awareness, increased frailty are contributory, close supervision for safety, update CBC/diff, CMP/eGFR, monitor for s/s of UTI, obtain MMSE  Gait abnormality Uses walker, w/c for mobility.   Edema Persisted, no significant changes, continue Furosemide 9m qd  Chronic diastolic heart failure Persisted edema BLE, continue Furosemide 433mqd.   Hypertension Controlled BP, elevated Sbp was related to fall, re checked 132/68, he denied HA, dizziness, change of vision, chest pain/pressrue, or palpitation, continue Lisinopril 2046md, Amlodipine 38m9m.   Lower back pain Right lower back/hip/leg pain, controlled, continue  Methocarbamol 250mg63m Mobic 15mg 45mTylenol 650mg b22m Gabapentin 200mg qd60m   Family/ staff Communication: plan of care reviewed with the patient and charge nurse.   Labs/tests ordered:  CBC/diff, CMP/eGFR  Time spend 40 minutes.

## 2019-02-28 NOTE — Assessment & Plan Note (Signed)
Right lower back/hip/leg pain, controlled, continue  Methocarbamol 250mg  qd, Mobic 15mg  qd, Tylenol 650mg  bid,  Gabapentin 200mg  qd.

## 2019-02-28 NOTE — Assessment & Plan Note (Signed)
Persisted, no significant changes, continue Furosemide 40mg  qd

## 2019-03-01 DIAGNOSIS — Z7389 Other problems related to life management difficulty: Secondary | ICD-10-CM | POA: Diagnosis not present

## 2019-03-01 DIAGNOSIS — M79604 Pain in right leg: Secondary | ICD-10-CM | POA: Diagnosis not present

## 2019-03-01 DIAGNOSIS — R2681 Unsteadiness on feet: Secondary | ICD-10-CM | POA: Diagnosis not present

## 2019-03-01 DIAGNOSIS — E876 Hypokalemia: Secondary | ICD-10-CM | POA: Diagnosis not present

## 2019-03-01 LAB — CBC AND DIFFERENTIAL
HCT: 38 — AB (ref 41–53)
Hemoglobin: 13.2 — AB (ref 13.5–17.5)
Platelets: 283 (ref 150–399)
WBC: 9.4

## 2019-03-01 LAB — BASIC METABOLIC PANEL
BUN: 14 (ref 4–21)
Creatinine: 0.9 (ref 0.6–1.3)
Glucose: 91
Potassium: 4.4 (ref 3.4–5.3)
Sodium: 139 (ref 137–147)

## 2019-03-01 LAB — HEPATIC FUNCTION PANEL
ALT: 16 (ref 10–40)
AST: 17 (ref 14–40)
Alkaline Phosphatase: 57 (ref 25–125)
Bilirubin, Total: 0.8

## 2019-03-03 DIAGNOSIS — Z7389 Other problems related to life management difficulty: Secondary | ICD-10-CM | POA: Diagnosis not present

## 2019-03-03 DIAGNOSIS — R2681 Unsteadiness on feet: Secondary | ICD-10-CM | POA: Diagnosis not present

## 2019-03-03 DIAGNOSIS — M79604 Pain in right leg: Secondary | ICD-10-CM | POA: Diagnosis not present

## 2019-03-04 DIAGNOSIS — Z7389 Other problems related to life management difficulty: Secondary | ICD-10-CM | POA: Diagnosis not present

## 2019-03-04 DIAGNOSIS — M79604 Pain in right leg: Secondary | ICD-10-CM | POA: Diagnosis not present

## 2019-03-04 DIAGNOSIS — R2681 Unsteadiness on feet: Secondary | ICD-10-CM | POA: Diagnosis not present

## 2019-03-07 DIAGNOSIS — R2681 Unsteadiness on feet: Secondary | ICD-10-CM | POA: Diagnosis not present

## 2019-03-07 DIAGNOSIS — Z7389 Other problems related to life management difficulty: Secondary | ICD-10-CM | POA: Diagnosis not present

## 2019-03-07 DIAGNOSIS — M79604 Pain in right leg: Secondary | ICD-10-CM | POA: Diagnosis not present

## 2019-03-10 DIAGNOSIS — Z7389 Other problems related to life management difficulty: Secondary | ICD-10-CM | POA: Diagnosis not present

## 2019-03-10 DIAGNOSIS — M79604 Pain in right leg: Secondary | ICD-10-CM | POA: Diagnosis not present

## 2019-03-10 DIAGNOSIS — R2681 Unsteadiness on feet: Secondary | ICD-10-CM | POA: Diagnosis not present

## 2019-03-11 DIAGNOSIS — R2681 Unsteadiness on feet: Secondary | ICD-10-CM | POA: Diagnosis not present

## 2019-03-11 DIAGNOSIS — Z7389 Other problems related to life management difficulty: Secondary | ICD-10-CM | POA: Diagnosis not present

## 2019-03-11 DIAGNOSIS — M79604 Pain in right leg: Secondary | ICD-10-CM | POA: Diagnosis not present

## 2019-03-15 ENCOUNTER — Telehealth: Payer: Self-pay | Admitting: *Deleted

## 2019-03-15 DIAGNOSIS — Z7389 Other problems related to life management difficulty: Secondary | ICD-10-CM | POA: Diagnosis not present

## 2019-03-15 DIAGNOSIS — M79604 Pain in right leg: Secondary | ICD-10-CM | POA: Diagnosis not present

## 2019-03-15 DIAGNOSIS — R2681 Unsteadiness on feet: Secondary | ICD-10-CM | POA: Diagnosis not present

## 2019-03-15 NOTE — Telephone Encounter (Signed)
Received a call from Madelin Rear ([place] Nurse) regarding a face to face with Dr. Lyndel Safe on 03/14/19, he stated that they discussed getting a wheelchair and he needs a script and the note regarding this wheel chair. Please Advise!

## 2019-03-16 DIAGNOSIS — M79604 Pain in right leg: Secondary | ICD-10-CM | POA: Diagnosis not present

## 2019-03-16 DIAGNOSIS — Z7389 Other problems related to life management difficulty: Secondary | ICD-10-CM | POA: Diagnosis not present

## 2019-03-16 DIAGNOSIS — R2681 Unsteadiness on feet: Secondary | ICD-10-CM | POA: Diagnosis not present

## 2019-03-16 NOTE — Telephone Encounter (Signed)
I will see him when I am in [place] and write the order.

## 2019-03-17 ENCOUNTER — Non-Acute Institutional Stay: Payer: Medicare HMO | Admitting: Internal Medicine

## 2019-03-17 ENCOUNTER — Encounter: Payer: Self-pay | Admitting: Internal Medicine

## 2019-03-17 ENCOUNTER — Encounter: Payer: Self-pay | Admitting: *Deleted

## 2019-03-17 ENCOUNTER — Telehealth: Payer: Self-pay | Admitting: *Deleted

## 2019-03-17 DIAGNOSIS — R269 Unspecified abnormalities of gait and mobility: Secondary | ICD-10-CM

## 2019-03-17 DIAGNOSIS — I2510 Atherosclerotic heart disease of native coronary artery without angina pectoris: Secondary | ICD-10-CM

## 2019-03-17 DIAGNOSIS — M79604 Pain in right leg: Secondary | ICD-10-CM | POA: Diagnosis not present

## 2019-03-17 DIAGNOSIS — M25551 Pain in right hip: Secondary | ICD-10-CM | POA: Diagnosis not present

## 2019-03-17 DIAGNOSIS — R2681 Unsteadiness on feet: Secondary | ICD-10-CM | POA: Diagnosis not present

## 2019-03-17 DIAGNOSIS — I2583 Coronary atherosclerosis due to lipid rich plaque: Secondary | ICD-10-CM | POA: Diagnosis not present

## 2019-03-17 DIAGNOSIS — I5032 Chronic diastolic (congestive) heart failure: Secondary | ICD-10-CM | POA: Diagnosis not present

## 2019-03-17 DIAGNOSIS — Z7389 Other problems related to life management difficulty: Secondary | ICD-10-CM | POA: Diagnosis not present

## 2019-03-17 LAB — CHLORIDE
Albumin: 3.9
Calcium: 9.5
Carbon Dioxide, Total: 30
Chloride: 103
EGFR (Non-African Amer.): 72
Globulin: 2.3
Total Protein: 6.2 g/dL

## 2019-03-17 NOTE — Telephone Encounter (Signed)
Mr [name] [name]( Physical Therapist) at [facility] called regarding the face - face encounter with [name] [name]. He needs that note before he can go forward with his wheel chair purchase. Please let me know when you have completed it and I will print it for him. Please Advise

## 2019-03-17 NOTE — Telephone Encounter (Signed)
I will do it today. Just open his chart for me.

## 2019-03-17 NOTE — Progress Notes (Signed)
Location:  [facility] Room Number: [place] of Service:  [facility] [phone]) Provider:  [name]  MD  [name], MD  Patient Care Team: [name], MD as PCP - General (Internal Medicine) Gatha Mayer, MD as Consulting Physician (Gastroenterology) [name], [name], MD as Consulting Physician (Cardiology) [name], MD as Attending Physician (Internal Medicine) Mast, Man X, NP as Nurse Practitioner (Internal Medicine)  Extended Emergency Contact Information Primary Emergency Contact: Mcfetridge,[name] Address: [address]          [place], [place] [address] [name] of [place] Phone: [phone] Mobile Phone: [phone] Relation: Daughter Secondary Emergency Contact: Benfer,[name] Address: [place] [address] [place] Mobile Phone: [phone] Relation: Daughter  Code Status:  Full Code Goals of care: Advanced Directive information Advanced Directives 03/17/2019  Does Patient Have a Medical Advance Directive? Yes  Type of Paramedic of [place];Living will  Does patient want to make changes to medical advance directive? No - Patient declined  Copy of [name] in Chart? Yes - validated most recent copy scanned in chart (See row information)  Pre-existing out of facility DNR order (yellow form or pink MOST form) -     Chief Complaint  Patient presents with  . Acute Visit    C/o - back pain, wheel chair    HPI:  Pt is a 85 y.o. [name] [name] seen today for an acute visit for back pain and eval for wheelchair  Patient has a history of BPH, bradycardia, CAD, hypertension, diastolic CHF, GERD,H/o Left Sphenoid Meningiomaand recently was in the hospital for Multifocal pneumonia and Acute Metabolic Encephalopathy He was admitted to SNF for therapy and now is transferred to [facility] for Long term Care Patient has a history of chronic right hip pain.  He  was seen by orthopedics who think that pain is related to L2-3 and L4-5 severe stenosis as per MRI done in 2019.  He responded to Medrol pack and epidural injection in 8/10 Since then we started him on meloxicam 15 mg daily, on Robaxin to 50 mg and increased his Neurontin to 200 mg. Patient's pain is controlled but he still limps when he walks.  And has had couple of falls. He continues to live in [place] and is independent cognitively and able to do his ADLs The therapy evaluated patient and they think he would benefit from wheelchair  Past Medical History:  Diagnosis Date  . Allergic rhinitis   . Amnesia   . BPH (benign prostatic hyperplasia)   . Bradycardia   . Colon polyps   . Coronary artery disease    MODERATE  . Dizziness   . Edema of lower extremity   . Erectile dysfunction   . H. pylori infection    Hx of   . Hard of hearing   . Headache(784.0)   . History of colon polyps 2009   Dr. Orr/Colonoscopy -small cecal adenoma  . Hx of colonoscopy 2009   Dr Lajoyce Corners, hemorrhoids & polyps  . Hyperlipidemia   . Hypertension   . Internal hemorrhoids 2009   Dr. Lajoyce Corners Colonoscopy  . Mitral valve regurgitation   . Reflux esophagitis    Past Surgical History:  Procedure Laterality Date  . APPENDECTOMY    . CARDIAC CATHETERIZATION  1997   REVEALED MILD TO MODERATE IRREGULARITIES. HIS LEFT EVNTRICULAR SYSTOLIC FUNCTION REVEALED NORMAL EJECTION  FRACTION WITH EF OF 70%  . CATARACT EXTRACTION, BILATERAL    . COLONOSCOPY  multiple  . fatty tumor removal     benign  . HEMORROIDECTOMY    . HIP ARTHROPLASTY Left 08/17/2013   Procedure: ARTHROPLASTY BIPOLAR HIP;  Surgeon: Gearlean Alf, MD;  Location: [facility];  Service: Orthopedics;  Laterality: Left;  . INGUINAL HERNIA REPAIR Bilateral 2006  . lower back surgery  2006  . NASAL SEPTUM SURGERY    . TONSILLECTOMY    . UPPER GASTROINTESTINAL ENDOSCOPY      No Known Allergies  Outpatient Encounter Medications as of 03/17/2019  Medication Sig   . acetaminophen (TYLENOL) 500 MG tablet Take 500 mg by mouth every 8 (eight) hours as needed.   Marland Kitchen amLODipine (NORVASC) 10 MG tablet Take 1 tablet (10 mg total) by mouth daily.  . Cholecalciferol 25 MCG (1000 UT) capsule Take 1,000 Units by mouth daily.  Marland Kitchen DEXTRAN 70-HYPROMELLOSE OP Apply 1 drop to eye daily as needed.  . furosemide (LASIX) 40 MG tablet Take 40 mg by mouth.  . gabapentin (NEURONTIN) 100 MG capsule Take 200 mg by mouth at bedtime.  . hydrocortisone (ANUSOL-HC) 2.5 % rectal cream Place 1 application rectally daily as needed for hemorrhoids or anal itching. With perineal applicator  . lisinopril (ZESTRIL) 20 MG tablet Take 20 mg by mouth daily.  . meloxicam (MOBIC) 15 MG tablet Take 15 mg by mouth daily.  . methocarbamol (ROBAXIN) 500 MG tablet Take 250 mg by mouth daily. Per Georg Ruddle  . methocarbamol (ROBAXIN) 500 MG tablet Take 250 mg by mouth daily as needed for muscle spasms. per Georg Ruddle  . Multiple Vitamin (MULTIVITAMIN) tablet Take 1 tablet by mouth daily.    . potassium chloride (KLOR-CON) 20 MEQ packet Take 20 mEq by mouth daily.  . rosuvastatin (CRESTOR) 5 MG tablet Take 5 mg by mouth daily. Takes rarely   . sodium chloride (DEEP SEA NASAL SPRAY) 0.65 % nasal spray Place 1 spray into the nose daily as needed for congestion.  . traZODone (DESYREL) 50 MG tablet Take 0.5 tablets (25 mg total) by mouth at bedtime.  . vitamin B-12 (CYANOCOBALAMIN) 1000 MCG tablet Take 1,000 mcg by mouth daily.  . Zinc 50 MG TABS Take 100 mg by mouth daily. On Mon and Friday  . [DISCONTINUED] acetaminophen (TYLENOL 8 HOUR) 650 MG CR tablet Take 650 mg by mouth 2 (two) times daily.  . [DISCONTINUED] Omega-3 Fatty Acids (FISH OIL) 1000 MG CAPS Take 1 capsule by mouth daily.   No facility-administered encounter medications on file as of 03/17/2019.     Review of Systems  Constitutional: Negative.   HENT: Negative.   Respiratory: Negative.   Gastrointestinal: Positive for  constipation.  Genitourinary: Negative.   Musculoskeletal: Positive for arthralgias, back pain and gait problem.  Neurological: Positive for weakness.  Psychiatric/Behavioral: Negative.   All other systems reviewed and are negative.   Immunization History  Administered Date(s) Administered  . Influenza-Unspecified 04/08/2016  . Pneumococcal Conjugate-13 06/02/2013  . Pneumococcal Polysaccharide-23 04/07/2001   Pertinent  Health Maintenance Due  Topic Date Due  . INFLUENZA VACCINE  01/15/2019  . PNA vac Low Risk Adult  Completed   Fall Risk  11/03/2017 07/11/2016  Falls in the past year? Yes Yes  Number falls in past yr: 2 or more 2 or more  Injury with Fall? Yes Yes  Comment - broke toe on right foot  Risk Factor Category  High Fall Risk -  Follow up Education provided;Falls prevention discussed -   Functional Status Survey:    Vitals:   03/17/19 1120  BP: (!) 146/74  Pulse: 80  Resp: 20  Temp: (!) 97.4 F (36.3 C)  SpO2: 94%  Weight: 166 lb 3.2 oz ([name].4 kg)  Height: 5\' 7"  (1.702 m)   Body mass index is 26.03 kg/m. Physical Exam Vitals signs reviewed.  Constitutional:      Appearance: Normal appearance.  HENT:     Head: Normocephalic.     Nose: Nose normal.     Mouth/Throat:     Mouth: Mucous membranes are moist.     Pharynx: Oropharynx is clear.  Eyes:     Pupils: Pupils are equal, round, and reactive to light.  Neck:     Musculoskeletal: Neck supple.  Cardiovascular:     Rate and Rhythm: Normal rate.     Pulses: Normal pulses.     Heart sounds: Normal heart sounds.  Pulmonary:     Effort: Pulmonary effort is normal.     Breath sounds: Normal breath sounds.  Abdominal:     General: Abdomen is flat.     Palpations: Abdomen is soft.  Musculoskeletal:        General: Swelling present.     Comments: Patient can get up from the chair with help of the walker.  But when he walks his limbs on the right leg and drags his right leg.  Mildly unsteady gait   Skin:    General: Skin is warm and dry.  Neurological:     Mental Status: He is alert and oriented to person, place, and time.  Psychiatric:        Mood and Affect: Mood normal.        Thought Content: Thought content normal.     Labs reviewed: Recent Labs    11/01/18 2348 11/02/18 0222 11/03/18 0337 11/04/18 0312 11/30/18 03/01/19  NA  --  134* 136 136 138 139  K  --  3.3* 3.7 3.3* 4.1 4.4  CL  --  99 101 103  --  103  CO2  --  24 24 23   --  30  GLUCOSE  --  114* 122* 107*  --   --   BUN  --  10 13 9 13 14   CREATININE  --  0.87 0.98 0.94 1.0 0.9  CALCIUM  --  8.7* 9.2 9.1  --  9.5  MG 1.8  --   --  1.8  --   --    Recent Labs    11/01/18 1624 11/04/18 0312 03/01/19  AST 31 26 17   ALT 24 22 16   ALKPHOS 64 48 57  BILITOT 1.2 1.8*  --   PROT 6.6 5.8* 6.2  ALBUMIN 4.1 3.5 3.9   Recent Labs    11/01/18 1405 11/02/18 0222 11/03/18 0337 11/04/18 0312 11/30/18 03/01/19  WBC 16.9* 12.8* 10.8* 9.0 5.5 9.4  NEUTROABS 15.0*  --   --  6.0  --   --   HGB 14.9 12.9* 13.0 12.8* 14.0 13.2*  HCT 42.9 36.7* 37.3* 36.8* 41 38*  MCV 94.3 93.6 93.3 92.5  --   --   PLT 223 192 202 186 200 283   Lab Results  Component Value Date   TSH 1.504 11/01/2018   No results found for: HGBA1C Lab Results  Component Value Date   CHOL 134 01/22/2019   HDL 57 01/22/2019   LDLCALC 63 01/22/2019   TRIG  59 01/22/2019   CHOLHDL 2.7 02/17/2017    Significant Diagnostic Results in last 30 days:  No results found.  Assessment/Plan Unsteady gait Unable to go long distance with his walker due to pain weakness He continues to be high risk for falls Therapy has recommended wheelchair Would write a prescription for it Pain in Right Hip ? Lower back Pain due to Lumbar stenosis Decrease the dose of meloxicam to 7.5 Continue Robaxin as needed Continue Neurontin 200 nightly We will also start on Prilosec for gastric protection  Other issues are stable Essential hypertension - Plan:  On  Norvasc and Lisinopril  Chronic diastolic heart failure - Plan:  On Chronic Lasix now Will Repeat BMP showed BUN/Creat Stable  Multifocal pneumonia - Plan:  Doing well Symptoms Resolved  Hyperlipidemia, On Crestor LDL 63  Family/ staff Communication:   Labs/tests ordered:   Total time spent in this patient care encounter was  25_  minutes; greater than 50% of the visit spent counseling patient and staff, reviewing records , Labs and coordinating care for problems addressed at this encounter.

## 2019-03-21 DIAGNOSIS — N528 Other male erectile dysfunction: Secondary | ICD-10-CM | POA: Diagnosis not present

## 2019-03-21 DIAGNOSIS — Z800 Family history of malignant neoplasm of digestive organs: Secondary | ICD-10-CM | POA: Diagnosis not present

## 2019-03-21 DIAGNOSIS — N138 Other obstructive and reflux uropathy: Secondary | ICD-10-CM | POA: Diagnosis not present

## 2019-03-21 DIAGNOSIS — N401 Enlarged prostate with lower urinary tract symptoms: Secondary | ICD-10-CM | POA: Diagnosis not present

## 2019-03-21 DIAGNOSIS — N529 Male erectile dysfunction, unspecified: Secondary | ICD-10-CM | POA: Diagnosis not present

## 2019-03-21 DIAGNOSIS — N3020 Other chronic cystitis without hematuria: Secondary | ICD-10-CM | POA: Diagnosis not present

## 2019-03-22 ENCOUNTER — Other Ambulatory Visit: Payer: Self-pay

## 2019-03-22 ENCOUNTER — Ambulatory Visit (INDEPENDENT_AMBULATORY_CARE_PROVIDER_SITE_OTHER): Payer: Medicare HMO | Admitting: Cardiovascular Disease

## 2019-03-22 ENCOUNTER — Encounter: Payer: Self-pay | Admitting: Cardiovascular Disease

## 2019-03-22 VITALS — BP 152/58 | HR 64 | Ht 67.0 in | Wt 167.1 lb

## 2019-03-22 DIAGNOSIS — R2681 Unsteadiness on feet: Secondary | ICD-10-CM | POA: Diagnosis not present

## 2019-03-22 DIAGNOSIS — Z7389 Other problems related to life management difficulty: Secondary | ICD-10-CM | POA: Diagnosis not present

## 2019-03-22 DIAGNOSIS — M79604 Pain in right leg: Secondary | ICD-10-CM | POA: Diagnosis not present

## 2019-03-22 DIAGNOSIS — I2510 Atherosclerotic heart disease of native coronary artery without angina pectoris: Secondary | ICD-10-CM | POA: Diagnosis not present

## 2019-03-22 DIAGNOSIS — E785 Hyperlipidemia, unspecified: Secondary | ICD-10-CM | POA: Diagnosis not present

## 2019-03-22 DIAGNOSIS — I2583 Coronary atherosclerosis due to lipid rich plaque: Secondary | ICD-10-CM

## 2019-03-22 NOTE — Patient Instructions (Signed)
Medication Instructions:  Your provider recommends that you continue on your current medications as directed. Please refer to the Current Medication list given to you today.    Labwork: None  Testing/Procedures: None  Follow-Up: Your provider wants you to follow-up in: 1 year with Dr. Acie Fredrickson. You will receive a reminder letter in the mail two months in advance. If you don't receive a letter, please call our office to schedule the follow-up appointment.

## 2019-03-22 NOTE — Progress Notes (Signed)
Cardiology Office Note   Date:  03/22/2019   ID:  [name] [name], DOB 04-04-1925, MRN [id number]  PCP:  [name], MD  Cardiologist:   Mertie Moores, MD   Chief Complaint  Patient presents with  . Coronary Artery Disease  . Congestive Heart Failure   1. Moderate coronary artery disease by heart catheterization 20 years ago 2. Hyperlipidemia 3. Hypertension 4. Mild AI 5, Mild TR  History of Present Illness:  Mr. [name] [name] is a 85 y.o. gentleman with the above noted hx. He still has some ankle edema that he is concerned about. He has not had any chest pain. He is still very active and healthy.  Dec. 23, 2014:  [name] has done well. No CP. No dyspnea or syncope. Still moderately active. He walks some. He does some stretching. He wants to start bowling again. He does have some symptoms when he first lies down or sits up. ? Vertigo vs. orthostasis hyotension.   03/28/2014:  Mr. [name] [name] is doing ok His wife died last year.  He stopped his amlodipine earlier this year - insurance would not pay for it so he stopped it. BP has been OK since that time Tries to watch his salt. Still eats canned veggies and other salty food.    Oct 20, 2014:  [name] [name] is a 85 y.o. male who presents for  Follow up for his CHf  May 24 ,2017:  Mr. [name] [name] is seen for his HTN and chronic diastolic CHF No CP or dyspnea.  Had some mid sternal CP several weeks ago - has resolved now  Sept. 5, 2018  Mr. 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   It is growing slowly  No CP or dyspnea.  Has some occasional leg pain .  Has some neuropathy also   He has stopped his Atorvastatin - his recent lipid levels were slightly elevated.  He was concerned about possible side effect.   March 22, 2019:  Mr. [name] [name] seen for follow-up visit.  He was found to have a benign brain  tumor last year.  He was examined  in the wheelchair.  Use a wheelchair mostly .  For  Short distances he will use his walker.   Naproxen cholesterol levels from his primary medical doctor.  His total cholesterol is 134.  The HDL is 57.  The triglyceride level is 59.  The LDL is 63.  Very hard of hearing   Past Medical History:  Diagnosis Date  . Allergic rhinitis   . Amnesia   . BPH (benign prostatic hyperplasia)   . Bradycardia   . Colon polyps   . Coronary artery disease    MODERATE  . Dizziness   . Edema of lower extremity   . Erectile dysfunction   . H. pylori infection    Hx of   . Hard of hearing   . Headache(784.0)   . History of colon polyps 2009   Dr. Orr/Colonoscopy -small cecal adenoma  . Hx of colonoscopy 2009   Dr Lajoyce Corners, hemorrhoids & polyps  . Hyperlipidemia   . Hypertension   . Internal hemorrhoids 2009   Dr. Lajoyce Corners Colonoscopy  . Mitral valve regurgitation   . Reflux esophagitis     Past Surgical History:  Procedure Laterality Date  . APPENDECTOMY    . [place]  MILD TO MODERATE IRREGULARITIES. HIS LEFT EVNTRICULAR SYSTOLIC FUNCTION REVEALED NORMAL EJECTION FRACTION WITH EF OF 70%  . CATARACT EXTRACTION, BILATERAL    . COLONOSCOPY  multiple  . fatty tumor removal     benign  . HEMORROIDECTOMY    . HIP ARTHROPLASTY Left 08/17/2013   Procedure: ARTHROPLASTY BIPOLAR HIP;  Surgeon: Gearlean Alf, MD;  Location: [facility];  Service: Orthopedics;  Laterality: Left;  . INGUINAL HERNIA REPAIR Bilateral 2006  . lower back surgery  2006  . NASAL SEPTUM SURGERY    . TONSILLECTOMY    . UPPER GASTROINTESTINAL ENDOSCOPY       Current Outpatient Medications  Medication Sig Dispense Refill  . acetaminophen (TYLENOL) 500 MG tablet Take 500 mg by mouth every 8 (eight) hours as needed.     Marland Kitchen amLODipine (NORVASC) 10 MG tablet Take 1 tablet (10 mg total) by mouth daily. 90 tablet 3  . Cholecalciferol 25 MCG (1000 UT) capsule Take 1,000  Units by mouth daily.    Marland Kitchen DEXTRAN 70-HYPROMELLOSE OP Apply 1 drop to eye daily as needed.    . furosemide (LASIX) 40 MG tablet Take 40 mg by mouth.    . gabapentin (NEURONTIN) 100 MG capsule Take 200 mg by mouth at bedtime.    . hydrocortisone (ANUSOL-HC) 2.5 % rectal cream Place 1 application rectally daily as needed for hemorrhoids or anal itching. With perineal applicator    . lisinopril (ZESTRIL) 20 MG tablet Take 20 mg by mouth daily.    . meloxicam (MOBIC) 15 MG tablet Take 15 mg by mouth daily.    . methocarbamol (ROBAXIN) 500 MG tablet Take 250 mg by mouth daily as needed for muscle spasms. per Georg Ruddle    . Multiple Vitamin (MULTIVITAMIN) tablet Take 1 tablet by mouth daily.      Marland Kitchen omeprazole (PRILOSEC) 20 MG capsule Take 20 mg by mouth daily.    . potassium chloride (KLOR-CON) 20 MEQ packet Take 20 mEq by mouth daily.    . rosuvastatin (CRESTOR) 5 MG tablet Take 5 mg by mouth daily. Takes rarely     . sodium chloride (DEEP SEA NASAL SPRAY) 0.65 % nasal spray Place 1 spray into the nose daily as needed for congestion.    . traZODone (DESYREL) 50 MG tablet Take 0.5 tablets (25 mg total) by mouth at bedtime. 30 tablet 0  . vitamin B-12 (CYANOCOBALAMIN) 1000 MCG tablet Take 1,000 mcg by mouth daily.    . Zinc 50 MG TABS Take 100 mg by mouth daily. On Mon and Friday     No current facility-administered medications for this visit.     Allergies:   Patient has no known allergies.    Social History:  The patient  reports that he quit smoking about 43 years ago. He has never used smokeless tobacco. He reports current alcohol use. He reports that he does not use drugs.   Family History:  The patient's family history includes Breast cancer in his sister; Colon cancer (age of onset: 79) in his mother.    ROS:  Noted in current hx    Physical Exam: Blood pressure (!) 152/58, pulse 64, height 5\' 7"  (1.702 m), weight 167 lb 1.9 oz (75.8 kg), SpO2 97 %.  GEN:  elderly male,  Very  hard of hearing  HEENT: Normal NECK: No JVD; No carotid bruits LYMPHATICS: No lymphadenopathy CARDIAC: RRR   RESPIRATORY:  Clear to auscultation without rales, wheezing or rhonchi  ABDOMEN: Soft, non-tender, non-distended  MUSCULOSKELETAL:  No edema; No deformity  SKIN: Warm and dry NEUROLOGIC:  Alert and oriented x 3   EKG:    Recent Labs: 11/01/2018: B Natriuretic Peptide 316.3; TSH 1.504 11/04/2018: Magnesium 1.8 03/01/2019: ALT 16; BUN 14; Creatinine 0.9; Hemoglobin 13.2; Platelets 283; Potassium 4.4; Sodium 139    Lipid Panel    Component Value Date/Time   CHOL 134 01/22/2019   CHOL 194 02/17/2017 0820   TRIG 59 01/22/2019   HDL 57 01/22/2019   HDL 72 02/17/2017 0820   CHOLHDL 2.7 02/17/2017 0820   CHOLHDL 2.2 10/30/2015 0737   VLDL 11 10/30/2015 0737   LDLCALC 63 01/22/2019   LDLCALC 105 (H) 02/17/2017 0820      Wt Readings from Last 3 Encounters:  03/22/19 167 lb 1.9 oz (75.8 kg)  03/17/19 166 lb 3.2 oz (75.4 kg)  02/28/19 164 lb 12.8 oz (74.8 kg)      Other studies Reviewed: Additional studies/ records that were reviewed today include: . Review of the above records demonstrates:    ASSESSMENT AND PLAN:  1. Moderate coronary artery disease by heart catheterization 20 years ago,   2. Hyperlipidemia- recent labs look good   3. Hypertension -   BP is a bit elevated but overall I think he is doig well   4. Balance issues:   Has improved.    5.  Brain meningioma Further plans per primary MD   5. Mild AI 6, Mild TR 7. Chronic diastolic congestive heart failure-he remains very stable. Continue current medications.   Current medicines are reviewed at length with the patient today.  The patient does not have concerns regarding medicines.  The following changes have been made:  no change  Labs/ tests ordered today include:   No orders of the defined types were placed in this encounter.   Continue same meds.  Disposition:   FU with me in 1 year      Mertie Moores, MD  03/22/2019 10:52 AM    [facility] [place], [place], [place]  [address] Phone: [phone]; Fax: [phone]

## 2019-03-22 NOTE — Progress Notes (Signed)
[id number]    Provider:  Dr Jaynee Eagles Referring Provider: [name], MD Primary Care Physician:  [name], MD  CC:  Tingling  Patient is referred again March 22, 2019 for similar symptoms seen in May 2019.  Dysesthesias of the right leg and low back pain. He has a past medical history of hypertension, Meningioma, coronary artery disease, hyperlipidemia, gait abnormality previously evaluated by neurology, chronic lower extremity edema, episodic dizziness, bradycardia, hard of hearing, leg edema. He has had symptoms for years, tingling in his right big toe, feels like electrical shocks, spreading up the groin.  Recommendations included considering evaluation of his right hip especially given the groin pain.  MRI of the lumbar spine and EMG nerve conduction study were ordered.  He follows with Dr. Ronnald Ramp neurosurgery for meningioma. ESI was ordered. At this time he likely needs to follow with Dr. Ronnald Ramp for any possible interventions or pain clinic for chronic pain management. We did refer him to neurosurgery in 11/2017. He still has a lot of back pain with radiation down the leg. Tingling in the right foot toe. He takes a lot of pain pills. He saw a neurosurgeon 4-5 years ago.   MRI lumbar spine showed:  Abnormal MRI Lumbar Spine showing prominent spondylitic and facet degenerative changes throughout most severe at L 2-3 where there is bilateral severe foraminal and moderate canal stenosis and impingement of bilateral nerve roots and and at L 4-5 where there is severe left sided foraminal narrowing with exiting nerve root impingement. Compared to prior MRI films from 09/30/2004 these changes appear much progressed.  HPI 10/2017:  [name] [name] is a 85 y.o. male here as a referral from Dr. Lyndel Safe for dysesthesias of the right leg.  He has a past medical history of hypertension, Meningioma, coronary artery disease, hyperlipidemia, gait abnormality previously evaluated by  neurology, chronic lower extremity edema, episodic dizziness, bradycardia, hard of hearing, leg edema. He has had symptoms for years, tingling in his right big, feels like electrical shocks but what has worsened and painful and spreading up the groin, in the groin it hurts, he has to put a pillow between his legs when he sleeps, extra strength tylenol helps, he feels weakness in his leg. The groin pain is NOT associated with the tingling in the toes but feels pain in the anterior thigh. It goes and comes, continuous every day and worsening. He has fallen. He fell this past Sunday on his right hip. Denies back pain. He had surgery in the past which affected his right leg, symptoms may be secondary to his previous back surgery or something new.  Reviewed notes, labs and imaging from outside physicians, which showed:  Reviewed referring physician's notes.  Patient complains of dysesthesias to the right leg.  Trace pitting edema noted in the bilateral lower extremities.  He was referred for neuropathy of the right leg.  He also has a meningioma and he sees neurosurgery Dr. Ronnald Ramp for a second opinion.   CBC unremarkable.  Personally reviewed MRI of the brain 2017 which showed a 12 mm meningioma along the medial aspect of the left middle cranial fossa with significant edema.  Review of Systems: Patient complains of symptoms per HPI as well as the following symptoms: imbalance,memory loss. Pertinent negatives and positives per HPI. All others negative.   Social History   Socioeconomic History   Marital status: Widowed    Spouse name: Not on file   Number of children: 3  Years of education: Not on file   Highest education level: Not on file  Occupational History   Not on file  Social Needs   Financial resource strain: Not on file   Food insecurity    Worry: Not on file    Inability: Not on file   Transportation needs    Medical: Not on file    Non-medical: Not on file  Tobacco Use    Smoking status: Former Smoker    Quit date: 10/25/1975    Years since quitting: 43.4   Smokeless tobacco: Never Used  Substance and Sexual Activity   Alcohol use: Yes    Comment: occ 6-7 times a year   Drug use: No   Sexual activity: Not on file  Lifestyle   Physical activity    Days per week: Not on file    Minutes per session: Not on file   Stress: Not on file  Relationships   Social connections    Talks on phone: Not on file    Gets together: Not on file    Attends religious service: Not on file    Active member of club or organization: Not on file    Attends meetings of clubs or organizations: Not on file    Relationship status: Not on file   Intimate partner violence    Fear of current or ex partner: Not on file    Emotionally abused: Not on file    Physically abused: Not on file    Forced sexual activity: Not on file  Other Topics Concern   Not on file  Social History Narrative   Retired and widowed   Lives at [facility]   Right handed    Family History  Problem Relation Age of Onset   Colon cancer Mother 43       second cancer in 2's deceased after emergency surgery   Breast cancer Sister     Past Medical History:  Diagnosis Date   Allergic rhinitis    Amnesia    BPH (benign prostatic hyperplasia)    Bradycardia    Colon polyps    Coronary artery disease    MODERATE   Dizziness    Edema of lower extremity    Erectile dysfunction    H. pylori infection    Hx of    Hard of hearing    Headache(784.0)    History of colon polyps 2009   Dr. Orr/Colonoscopy -small cecal adenoma   Hx of colonoscopy 2009   Dr Lajoyce Corners, hemorrhoids & polyps   Hyperlipidemia    Hypertension    Internal hemorrhoids 2009   Dr. Lajoyce Corners Colonoscopy   Mitral valve regurgitation    Reflux esophagitis     Past Surgical History:  Procedure Laterality Date   APPENDECTOMY     CARDIAC CATHETERIZATION  1997   REVEALED MILD TO [place]. HIS LEFT EVNTRICULAR SYSTOLIC FUNCTION REVEALED NORMAL EJECTION FRACTION WITH EF OF 70%   CATARACT EXTRACTION, BILATERAL     COLONOSCOPY  multiple   fatty tumor removal     benign   HEMORROIDECTOMY     HIP ARTHROPLASTY Left 08/17/2013   Procedure: ARTHROPLASTY BIPOLAR HIP;  Surgeon: Gearlean Alf, MD;  Location: [facility];  Service: Orthopedics;  Laterality: Left;   INGUINAL HERNIA REPAIR Bilateral 2006   lower back surgery  2006   NASAL SEPTUM SURGERY     TONSILLECTOMY     UPPER GASTROINTESTINAL ENDOSCOPY  Current Outpatient Medications  Medication Sig Dispense Refill   acetaminophen (TYLENOL) 500 MG tablet Take 500 mg by mouth every 8 (eight) hours as needed.      amLODipine (NORVASC) 10 MG tablet Take 1 tablet (10 mg total) by mouth daily. 90 tablet 3   Cholecalciferol 25 MCG (1000 UT) capsule Take 1,000 Units by mouth daily.     DEXTRAN 70-HYPROMELLOSE OP Apply 1 drop to eye daily as needed.     furosemide (LASIX) 40 MG tablet Take 40 mg by mouth.     gabapentin (NEURONTIN) 100 MG capsule Take 200 mg by mouth at bedtime.     hydrocortisone (ANUSOL-HC) 2.5 % rectal cream Place 1 application rectally daily as needed for hemorrhoids or anal itching. With perineal applicator     lisinopril (ZESTRIL) 20 MG tablet Take 20 mg by mouth daily.     meloxicam (MOBIC) 15 MG tablet Take 15 mg by mouth daily.     methocarbamol (ROBAXIN) 500 MG tablet Take 250 mg by mouth daily as needed for muscle spasms. per Georg Ruddle     Multiple Vitamin (MULTIVITAMIN) tablet Take 1 tablet by mouth daily.       omeprazole (PRILOSEC) 20 MG capsule Take 20 mg by mouth daily.     potassium chloride (KLOR-CON) 20 MEQ packet Take 20 mEq by mouth daily.     rosuvastatin (CRESTOR) 5 MG tablet Take 5 mg by mouth daily. Takes rarely      sodium chloride (DEEP SEA NASAL SPRAY) 0.65 % nasal spray Place 1 spray into the nose daily as needed for congestion.     traZODone  (DESYREL) 50 MG tablet Take 0.5 tablets (25 mg total) by mouth at bedtime. 30 tablet 0   vitamin B-12 (CYANOCOBALAMIN) 1000 MCG tablet Take 1,000 mcg by mouth daily.     Zinc 50 MG TABS Take 100 mg by mouth daily. On Mon and Friday     No current facility-administered medications for this visit.     Allergies as of 03/23/2019   (No Known Allergies)    Vitals: There were no vitals taken for this visit. Last Weight:  Wt Readings from Last 1 Encounters:  03/22/19 167 lb 1.9 oz (75.8 kg)   Last Height:   Ht Readings from Last 1 Encounters:  03/22/19 5\' 7"  (1.702 m)   Neuro: Detailed Neurologic Exam  Speech:    Speech is normal Cognition:    The patient is oriented to person, place, and time;     recent and remote memory impaired;     language fluent;     Impaired attention, concentration,  fund of knowledge Cranial Nerves:    The pupils are equal, round, and reactive to light.  Extraocular movements are intact. Trigeminal sensation is intact and the muscles of mastication are normal. The face is symmetric. The palate elevates in the midline. Hearing impaired. Voice is normal. Shoulder shrug is normal. The tongue has normal motion without fasciculations.   Gait:    Wide based and shuffling, in a wheelchair today but uses a walker  Motor Observation: No resting tremor or action.  No asymmetry, no atrophy, and no involuntary movements noted. Tone:    Normal muscle tone.  No cogwheeling.   Posture:    Posture is slightly stooped    Strength: Mild right leg flexion weakness     Sensation: intact to LT     Reflex Exam:  DTR's:    Absent right patellar and AJ.  Assessment/Plan:   He has had symptoms for years, tingling in his right leg to the toe , feels like electrical shocks but what has worsened and painful and spreading  It goes and comes, continuous every day and worsening.   He had surgery in the past which affected his right leg, symptoms may be  secondary to his previous back surgery or his progresive lumbar degenerative disease. He has significant and progressive lumbar degenerative disease(see MRI report below). There is not much we can do in neurology, he needs continued pain management, epidural steroid injections, we saw him last year and referred him for both. I will contact Dr. Lyndel Safe and explain. Follow up with Dr. Lyndel Safe for medication management.   Meningioma: Following with Dr. Ronnald Ramp Neurosurgery, may consider injections in the low back as well but he declines injections, no surgery. Will defer to Dr. Lyndel Safe, he declines any surgery, shots did not help. At this point medical management, nothing further from neurology.  MR lumbar spine 2019: MRI lumbar spine showed:  Abnormal MRI Lumbar Spine showing prominent spondylitic and facet degenerative changes throughout most severe at L 2-3 where there is bilateral severe foraminal and moderate canal stenosis and impingement of bilateral nerve roots and and at L 4-5 where there is severe left sided foraminal narrowing with exiting nerve root impingement. Compared to prior MRI films from 09/30/2004 these changes appear much progressed  Falls:  fall precautions.    Cc:Dr. Rozann Lesches, MD  [facility] [address] [place], [place] [address]  Phone [phone] Fax [phone]  A total of 25 minutes was spent face-to-face with this patient. Over half this time was spent on counseling patient on the No diagnosis found. diagnosis and different diagnostic and therapeutic options, counseling and coordination of care, risks ans benefits of management, compliance, or risk factor reduction and education.

## 2019-03-23 ENCOUNTER — Encounter: Payer: Self-pay | Admitting: Neurology

## 2019-03-23 ENCOUNTER — Ambulatory Visit (INDEPENDENT_AMBULATORY_CARE_PROVIDER_SITE_OTHER): Payer: Medicare HMO | Admitting: Neurology

## 2019-03-23 ENCOUNTER — Encounter: Payer: Self-pay | Admitting: Nurse Practitioner

## 2019-03-23 VITALS — BP 150/60 | HR 68 | Temp 96.4°F | Wt 165.0 lb

## 2019-03-23 DIAGNOSIS — N400 Enlarged prostate without lower urinary tract symptoms: Secondary | ICD-10-CM | POA: Insufficient documentation

## 2019-03-23 DIAGNOSIS — M5136 Other intervertebral disc degeneration, lumbar region: Secondary | ICD-10-CM | POA: Diagnosis not present

## 2019-03-24 DIAGNOSIS — R2681 Unsteadiness on feet: Secondary | ICD-10-CM | POA: Diagnosis not present

## 2019-03-24 DIAGNOSIS — Z7389 Other problems related to life management difficulty: Secondary | ICD-10-CM | POA: Diagnosis not present

## 2019-03-24 DIAGNOSIS — M79604 Pain in right leg: Secondary | ICD-10-CM | POA: Diagnosis not present

## 2019-03-25 DIAGNOSIS — R2681 Unsteadiness on feet: Secondary | ICD-10-CM | POA: Diagnosis not present

## 2019-03-25 DIAGNOSIS — Z7389 Other problems related to life management difficulty: Secondary | ICD-10-CM | POA: Diagnosis not present

## 2019-03-25 DIAGNOSIS — M79604 Pain in right leg: Secondary | ICD-10-CM | POA: Diagnosis not present

## 2019-03-28 DIAGNOSIS — M545 Low back pain: Secondary | ICD-10-CM | POA: Diagnosis not present

## 2019-03-29 DIAGNOSIS — R2681 Unsteadiness on feet: Secondary | ICD-10-CM | POA: Diagnosis not present

## 2019-03-29 DIAGNOSIS — M79604 Pain in right leg: Secondary | ICD-10-CM | POA: Diagnosis not present

## 2019-03-29 DIAGNOSIS — Z7389 Other problems related to life management difficulty: Secondary | ICD-10-CM | POA: Diagnosis not present

## 2019-04-04 DIAGNOSIS — R2681 Unsteadiness on feet: Secondary | ICD-10-CM | POA: Diagnosis not present

## 2019-04-04 DIAGNOSIS — M79604 Pain in right leg: Secondary | ICD-10-CM | POA: Diagnosis not present

## 2019-04-04 DIAGNOSIS — Z7389 Other problems related to life management difficulty: Secondary | ICD-10-CM | POA: Diagnosis not present

## 2019-04-05 DIAGNOSIS — M79604 Pain in right leg: Secondary | ICD-10-CM | POA: Diagnosis not present

## 2019-04-05 DIAGNOSIS — Z7389 Other problems related to life management difficulty: Secondary | ICD-10-CM | POA: Diagnosis not present

## 2019-04-05 DIAGNOSIS — R2681 Unsteadiness on feet: Secondary | ICD-10-CM | POA: Diagnosis not present

## 2019-04-19 DIAGNOSIS — Z03818 Encounter for observation for suspected exposure to other biological agents ruled out: Secondary | ICD-10-CM | POA: Diagnosis not present

## 2019-04-26 DIAGNOSIS — Z03818 Encounter for observation for suspected exposure to other biological agents ruled out: Secondary | ICD-10-CM | POA: Diagnosis not present

## 2019-04-28 DIAGNOSIS — M545 Low back pain: Secondary | ICD-10-CM | POA: Diagnosis not present

## 2019-05-03 DIAGNOSIS — Z03818 Encounter for observation for suspected exposure to other biological agents ruled out: Secondary | ICD-10-CM | POA: Diagnosis not present

## 2019-05-11 ENCOUNTER — Encounter: Payer: Self-pay | Admitting: Nurse Practitioner

## 2019-05-11 ENCOUNTER — Non-Acute Institutional Stay: Payer: Medicare HMO | Admitting: Nurse Practitioner

## 2019-05-11 DIAGNOSIS — I10 Essential (primary) hypertension: Secondary | ICD-10-CM | POA: Diagnosis not present

## 2019-05-11 DIAGNOSIS — M25551 Pain in right hip: Secondary | ICD-10-CM

## 2019-05-11 DIAGNOSIS — R69 Illness, unspecified: Secondary | ICD-10-CM | POA: Diagnosis not present

## 2019-05-11 DIAGNOSIS — M5441 Lumbago with sciatica, right side: Secondary | ICD-10-CM | POA: Diagnosis not present

## 2019-05-11 DIAGNOSIS — R609 Edema, unspecified: Secondary | ICD-10-CM | POA: Diagnosis not present

## 2019-05-11 DIAGNOSIS — I5032 Chronic diastolic (congestive) heart failure: Secondary | ICD-10-CM

## 2019-05-11 DIAGNOSIS — F411 Generalized anxiety disorder: Secondary | ICD-10-CM

## 2019-05-11 NOTE — Progress Notes (Signed)
Location:   [facility] Room Number: 907 Place of Service:  ALF (13) Provider:  Aroura Vasudevan NP  [name], MD  Patient Care Team: [name], MD as PCP - General (Internal Medicine) Gatha Mayer, MD as Consulting Physician (Gastroenterology) [name], [name], MD as Consulting Physician (Cardiology) [name], MD as Attending Physician (Internal Medicine) Judit Awad X, NP as Nurse Practitioner (Internal Medicine)  Extended Emergency Contact Information Primary Emergency Contact: Delfin,[name] Address: [address]          [place], [place] [address] [name] of [place] Phone: [phone] Mobile Phone: [phone] Relation: Daughter Secondary Emergency Contact: Pata,[name] Address: [place] [address] [place] Mobile Phone: [phone] Relation: Daughter  Code Status:  Full Code Goals of care: Advanced Directive information Advanced Directives 03/17/2019  Does Patient Have a Medical Advance Directive? Yes  Type of Paramedic of [place];Living will  Does patient want to make changes to medical advance directive? No - Patient declined  Copy of [place] in Chart? Yes - validated most recent copy scanned in chart (See row information)  Pre-existing out of facility DNR order (yellow form or pink MOST form) -     Chief Complaint  Patient presents with   Medical Management of Chronic Issues    HPI:  Pt is a 85 y.o. male seen today for medical management of chronic diseases.    The patient resides in [facility] for safety, care assistance, chronic lower back, right hip/thigh pain, tingling toes, opted out surgery, had spinal inj, taking Methocarbamol 250mg  qd and prn, Meloxicam 7.5mg  qd, Gabapentin 200mg  qd, Tylenol 650mg  bid. W/c for mobility. His mood is stable, on Trazodone 25mg  qd. HTN, blood pressure is controlled, on Lisinopril 20mg  qd, Amlodipine  10mg  qd. /CHF/BLE edema, trace to 1+, taking Furosemide 40mg  qd.    Past Medical History:  Diagnosis Date   Allergic rhinitis    Amnesia    Atherosclerotic heart disease of native coronary artery without angina pectoris    BPH (benign prostatic hyperplasia)    Bradycardia    Chronic diastolic (congestive) heart failure (HCC)    Colon polyps    Coronary artery disease    MODERATE   Dizziness    Edema of lower extremity    Erectile dysfunction    H. pylori infection    Hx of    Hard of hearing    Headache(784.0)    History of colon polyps 2009   Dr. Levester Fresh -small cecal adenoma   History of falling    Hx of colonoscopy 2009   Dr Lajoyce Corners, hemorrhoids & polyps   Hyperlipidemia    Hypertension    Hypertensive heart disease with heart failure ([place])    Hypokalemia    Internal hemorrhoids 2009   Dr. Lajoyce Corners Colonoscopy   Metabolic encephalopathy    Mitral valve regurgitation    Muscle weakness (generalized)    Pneumonia    Primary generalized (osteo)arthritis    Reflux esophagitis    Sepsis ([place]) 11/04/2018   Unspecified hemorrhoids    Unsteadiness on feet    Vitamin D deficiency    Past Surgical History:  Procedure Laterality Date   APPENDECTOMY     CARDIAC CATHETERIZATION  1997   REVEALED MILD TO MODERATE IRREGULARITIES. HIS LEFT EVNTRICULAR SYSTOLIC FUNCTION REVEALED NORMAL EJECTION FRACTION WITH EF OF 70%   CATARACT EXTRACTION,  BILATERAL     COLONOSCOPY  multiple   fatty tumor removal     benign   HEMORROIDECTOMY     HIP ARTHROPLASTY Left 08/17/2013   Procedure: ARTHROPLASTY BIPOLAR HIP;  Surgeon: Gearlean Alf, MD;  Location: [facility];  Service: Orthopedics;  Laterality: Left;   INGUINAL HERNIA REPAIR Bilateral 2006   lower back surgery  2006   NASAL SEPTUM SURGERY     TONSILLECTOMY     UPPER GASTROINTESTINAL ENDOSCOPY      No Known Allergies  Allergies as of 05/11/2019   No Known Allergies     Medication List        Accurate as of May 11, 2019 11:59 PM. If you have any questions, ask your nurse or doctor.        acetaminophen 500 MG tablet Commonly known as: TYLENOL Take 500 mg by mouth every 8 (eight) hours as needed.   acetaminophen 650 MG CR tablet Commonly known as: TYLENOL Take 650 mg by mouth 2 (two) times daily.   amLODipine 10 MG tablet Commonly known as: NORVASC Take 1 tablet (10 mg total) by mouth daily.   Anusol-HC 2.5 % rectal cream Generic drug: hydrocortisone Place 1 application rectally daily as needed for hemorrhoids or anal itching. With perineal applicator   Cholecalciferol 25 MCG (1000 UT) capsule Take 1,000 Units by mouth daily.   Deep Sea Nasal Spray 0.65 % nasal spray Generic drug: sodium chloride Place 1 spray into the nose daily as needed for congestion.   DEXTRAN 70-HYPROMELLOSE OP Apply 1 drop to eye daily as needed.   furosemide 40 MG tablet Commonly known as: LASIX Take 40 mg by mouth.   gabapentin 100 MG capsule Commonly known as: NEURONTIN Take 200 mg by mouth at bedtime.   lisinopril 20 MG tablet Commonly known as: ZESTRIL Take 20 mg by mouth daily.   meloxicam 7.5 MG tablet Commonly known as: MOBIC Take 7.5 mg by mouth daily.   Robaxin 500 MG tablet Generic drug: methocarbamol Take 250 mg by mouth daily.   methocarbamol 500 MG tablet Commonly known as: ROBAXIN Take 250 mg by mouth daily as needed for muscle spasms. per Georg Ruddle   multivitamin tablet Take 1 tablet by mouth daily.   omeprazole 20 MG capsule Commonly known as: PRILOSEC Take 20 mg by mouth daily.   potassium chloride 20 MEQ packet Commonly known as: KLOR-CON Take 20 mEq by mouth daily.   rosuvastatin 5 MG tablet Commonly known as: CRESTOR Take 5 mg by mouth daily. Takes rarely   traZODone 50 MG tablet Commonly known as: DESYREL Take 0.5 tablets (25 mg total) by mouth at bedtime.   vitamin B-12 1000 MCG tablet Commonly known as:  CYANOCOBALAMIN Take 1,000 mcg by mouth daily.   Zinc 50 MG Tabs Take 100 mg by mouth daily. On Mon and Friday      ROS was provided with assistance of staff.  Review of Systems  Constitutional: Negative for activity change, appetite change, chills, diaphoresis, fatigue and fever.  HENT: Positive for hearing loss. Negative for congestion and voice change.   Eyes: Negative for visual disturbance.  Respiratory: Negative for cough, shortness of breath and wheezing.   Cardiovascular: Positive for leg swelling.  Gastrointestinal: Negative for abdominal distention, abdominal pain, constipation, diarrhea, nausea and vomiting.  Genitourinary: Negative for difficulty urinating, dysuria and urgency.  Musculoskeletal: Positive for arthralgias, back pain and gait problem.       Lower back, right hip/thigh pain  Skin: Positive for color change. Negative for pallor.  Neurological: Negative for dizziness, speech difficulty, weakness and headaches.       Memory lapses. Tingling in toes.   Psychiatric/Behavioral: Negative for agitation, behavioral problems, hallucinations and sleep disturbance. The patient is not nervous/anxious.     Immunization History  Administered Date(s) Administered   Influenza-Unspecified 04/08/2016   Pneumococcal Conjugate-13 06/02/2013   Pneumococcal Polysaccharide-23 04/07/2001   Pertinent  Health Maintenance Due  Topic Date Due   INFLUENZA VACCINE  Completed   PNA vac Low Risk Adult  Completed   Fall Risk  11/03/2017 07/11/2016  Falls in the past year? Yes Yes  Number falls in past yr: 2 or more 2 or more  Injury with Fall? Yes Yes  Comment - broke toe on right foot  Risk Factor Category  High Fall Risk -  Follow up Education provided;Falls prevention discussed -   Functional Status Survey:    Vitals:   05/11/19 1502  BP: 110/60  Pulse: 66  Resp: 18  Temp: 98.7 F (37.1 C)  SpO2: 96%  Weight: 166 lb 12.8 oz (75.7 kg)  Height: 5\' 7"  (1.702 m)    Body mass index is 26.12 kg/m. Physical Exam Vitals signs and nursing note reviewed.  Constitutional:      General: He is not in acute distress.    Appearance: Normal appearance. He is not ill-appearing, toxic-appearing or diaphoretic.  HENT:     Head: Normocephalic and atraumatic.     Nose: Nose normal.     Mouth/Throat:     Mouth: Mucous membranes are moist.  Eyes:     Extraocular Movements: Extraocular movements intact.     Conjunctiva/sclera: Conjunctivae normal.     Pupils: Pupils are equal, round, and reactive to light.  Neck:     Musculoskeletal: Normal range of motion and neck supple. No neck rigidity or muscular tenderness.     Vascular: No carotid bruit.  Cardiovascular:     Rate and Rhythm: Normal rate and regular rhythm.     Heart sounds: Murmur present.  Pulmonary:     Breath sounds: No wheezing, rhonchi or rales.  Abdominal:     General: Bowel sounds are normal. There is no distension.     Palpations: Abdomen is soft.     Tenderness: There is no abdominal tenderness. There is no right CVA tenderness, left CVA tenderness, guarding or rebound.  Musculoskeletal:     Right lower leg: Edema present.     Left lower leg: Edema present.     Comments: Trace to 1+ edema BLE  Lymphadenopathy:     Cervical: No cervical adenopathy.  Skin:    General: Skin is warm and dry.     Comments: Chronic venous insufficiency skin changes BLE.   Neurological:     General: No focal deficit present.     Mental Status: He is alert. Mental status is at baseline.     Cranial Nerves: No cranial nerve deficit.     Motor: No weakness.     Coordination: Coordination normal.     Gait: Gait abnormal.     Comments: Oriented to person, place.   Psychiatric:        Mood and Affect: Mood normal.        Behavior: Behavior normal.        Thought Content: Thought content normal.        Judgment: Judgment normal.     Labs reviewed: Recent Labs    11/01/18  2348 11/02/18 0222  11/03/18 0337 11/04/18 0312 11/30/18 03/01/19  NA  --  134* 136 136 138 139  K  --  3.3* 3.7 3.3* 4.1 4.4  CL  --  99 101 103  --  103  CO2  --  24 24 23   --  30  GLUCOSE  --  114* 122* 107*  --   --   BUN  --  10 13 9 13 14   CREATININE  --  0.87 0.98 0.94 1.0 0.9  CALCIUM  --  8.7* 9.2 9.1  --  9.5  MG 1.8  --   --  1.8  --   --    Recent Labs    11/01/18 1624 11/04/18 0312 03/01/19  AST 31 26 17   ALT 24 22 16   ALKPHOS 64 48 57  BILITOT 1.2 1.8*  --   PROT 6.6 5.8* 6.2  ALBUMIN 4.1 3.5 3.9   Recent Labs    11/01/18 1405 11/02/18 0222 11/03/18 0337 11/04/18 0312 11/30/18 03/01/19  WBC 16.9* 12.8* 10.8* 9.0 5.5 9.4  NEUTROABS 15.0*  --   --  6.0  --   --   HGB 14.9 12.9* 13.0 12.8* 14.0 13.2*  HCT 42.9 36.7* 37.3* 36.8* 41 38*  MCV 94.3 93.6 93.3 92.5  --   --   PLT 223 192 202 186 200 283   Lab Results  Component Value Date   TSH 1.504 11/01/2018   No results found for: HGBA1C Lab Results  Component Value Date   CHOL 134 01/22/2019   HDL 57 01/22/2019   LDLCALC 63 01/22/2019   TRIG 59 01/22/2019   CHOLHDL 2.7 02/17/2017    Significant Diagnostic Results in last 30 days:  No results found.  Assessment/Plan Chronic diastolic heart failure (HCC) Chronic edema BLE, trace to 1+, continue Furosemide 40mg  qd.   Hypertension Blood pressure is controlled, continue Lisinopril, Amlodipine.   Right hip pain Lower back, had spinal inj, say Ortho 01/07/19, x-rayed, Medro dose pk. Right thigh pain, tingling in toes. Continue Tylenol, Methocarbamol, Meloxicam, Gabapentin.   Lower back pain Chronic, no change of current meds.   Generalized anxiety disorder His mood is stable, continue Trazodone.   Edema Chronic edema BLE. Continue Furosemide.      Family/ staff Communication: plan of care reviewed with the patient and charge nurse.   Labs/tests ordered:  none  Time spend 40 minutes

## 2019-05-20 ENCOUNTER — Encounter: Payer: Self-pay | Admitting: Nurse Practitioner

## 2019-05-20 NOTE — Assessment & Plan Note (Signed)
Blood pressure is controlled, continue Lisinopril, Amlodipine.

## 2019-05-20 NOTE — Assessment & Plan Note (Addendum)
Chronic, right hip/thigh pain, tingling in toes, no change of current meds. 03/23/19 neurology, MRI, DDD,

## 2019-05-20 NOTE — Assessment & Plan Note (Signed)
Lower back, had spinal inj, say Ortho 01/07/19, x-rayed, Medro dose pk. Right thigh pain, tingling in toes. Continue Tylenol, Methocarbamol, Meloxicam, Gabapentin.

## 2019-05-20 NOTE — Assessment & Plan Note (Signed)
Chronic edema BLE, trace to 1+, continue Furosemide 40mg  qd.

## 2019-05-20 NOTE — Assessment & Plan Note (Signed)
Chronic edema BLE. Continue Furosemide.

## 2019-05-20 NOTE — Assessment & Plan Note (Signed)
His mood is stable, continue Trazodone.

## 2019-05-28 DIAGNOSIS — M545 Low back pain: Secondary | ICD-10-CM | POA: Diagnosis not present

## 2019-05-31 DIAGNOSIS — Z20828 Contact with and (suspected) exposure to other viral communicable diseases: Secondary | ICD-10-CM | POA: Diagnosis not present

## 2019-06-07 DIAGNOSIS — Z20828 Contact with and (suspected) exposure to other viral communicable diseases: Secondary | ICD-10-CM | POA: Diagnosis not present

## 2019-06-28 DIAGNOSIS — M545 Low back pain: Secondary | ICD-10-CM | POA: Diagnosis not present

## 2019-06-28 DIAGNOSIS — I10 Essential (primary) hypertension: Secondary | ICD-10-CM | POA: Diagnosis not present

## 2019-06-28 LAB — LIPID PANEL
Cholesterol: 121 (ref 0–200)
HDL: 46 (ref 35–70)
LDL Cholesterol: 61
LDl/HDL Ratio: 2.6
Triglycerides: 62 (ref 40–160)

## 2019-06-29 DIAGNOSIS — Z20828 Contact with and (suspected) exposure to other viral communicable diseases: Secondary | ICD-10-CM | POA: Diagnosis not present

## 2019-07-06 DIAGNOSIS — Z20828 Contact with and (suspected) exposure to other viral communicable diseases: Secondary | ICD-10-CM | POA: Diagnosis not present

## 2019-07-13 DIAGNOSIS — Z20828 Contact with and (suspected) exposure to other viral communicable diseases: Secondary | ICD-10-CM | POA: Diagnosis not present

## 2019-07-19 ENCOUNTER — Encounter: Payer: Self-pay | Admitting: Internal Medicine

## 2019-07-19 NOTE — Progress Notes (Deleted)
Location:  [facility] Room Number: 907-A Place of Service:  ALF [phone]) Provider:  [name], MD  Patient Care Team: [name], MD as PCP - General (Internal Medicine) Gatha Mayer, MD as Consulting Physician (Gastroenterology) [name], [name], MD as Consulting Physician (Cardiology) [name], MD as Attending Physician (Internal Medicine) Mast, Man X, NP as Nurse Practitioner (Internal Medicine)  Extended Emergency Contact Information Primary Emergency Contact: [name],[name] Address: [address]          [place], [place] [address] [name] [name] of [place] Phone: [phone] Mobile Phone: [phone] Relation: Daughter Secondary Emergency Contact: [name],[name] Address: [place] [address] [place] Mobile Phone: [phone] Relation: Daughter  Code Status:  FULL CODE Goals of care: Advanced Directive information Advanced Directives 03/17/2019  Does Patient Have a Medical Advance Directive? Yes  Type of Paramedic of [place];Living will  Does patient want to make changes to medical advance directive? No - Patient declined  Copy of [place] in Chart? Yes - validated most recent copy scanned in chart (See row information)  Pre-existing out of facility DNR order (yellow form or pink MOST form) -     Chief Complaint  Patient presents with  . Medical Management of Chronic Issues    Routine [facility] AL visit    HPI:  Pt is a 86 y.o. male seen today for medical management of chronic diseases.     Past Medical History:  Diagnosis Date  . Allergic rhinitis   . Amnesia   . Atherosclerotic heart disease of native coronary artery without angina pectoris   . BPH (benign prostatic hyperplasia)   . Bradycardia   . Chronic diastolic (congestive) heart failure ([place])   . Colon polyps   . Coronary artery disease    MODERATE  . Dizziness   .  Edema of lower extremity   . Erectile dysfunction   . H. pylori infection    Hx of   . Hard of hearing   . Headache(784.0)   . History of colon polyps 2009   Dr. Orr/Colonoscopy -small cecal adenoma  . History of falling   . Hx of colonoscopy 2009   Dr Lajoyce Corners, hemorrhoids & polyps  . Hyperlipidemia   . Hypertension   . Hypertensive heart disease with heart failure ([place])   . Hypokalemia   . Internal hemorrhoids 2009   Dr. Lajoyce Corners Colonoscopy  . Metabolic encephalopathy   . Mitral valve regurgitation   . Muscle weakness (generalized)   . Pneumonia   . Primary generalized (osteo)arthritis   . Reflux esophagitis   . Sepsis ([place]) 11/04/2018  . Unspecified hemorrhoids   . Unsteadiness on feet   . Vitamin D deficiency    Past Surgical History:  Procedure Laterality Date  . APPENDECTOMY    . CARDIAC CATHETERIZATION  1997   REVEALED MILD TO MODERATE IRREGULARITIES. HIS LEFT EVNTRICULAR SYSTOLIC FUNCTION REVEALED NORMAL EJECTION FRACTION WITH EF OF 70%  . CATARACT EXTRACTION, BILATERAL    . COLONOSCOPY  multiple  . fatty tumor removal     benign  . HEMORROIDECTOMY    . HIP ARTHROPLASTY Left 08/17/2013   Procedure: ARTHROPLASTY BIPOLAR HIP;  Surgeon: Gearlean Alf, MD;  Location: [facility];  Service: Orthopedics;  Laterality: Left;  . INGUINAL HERNIA REPAIR Bilateral 2006  . lower back surgery  2006  . NASAL SEPTUM SURGERY    .  TONSILLECTOMY    . UPPER GASTROINTESTINAL ENDOSCOPY      No Known Allergies  Outpatient Encounter Medications as of 07/19/2019  Medication Sig  . acetaminophen (TYLENOL) 500 MG tablet Take 500 mg by mouth every 8 (eight) hours as needed.   Marland Kitchen acetaminophen (TYLENOL) 650 MG CR tablet Take 650 mg by mouth 2 (two) times daily.  Marland Kitchen amLODipine (NORVASC) 10 MG tablet Take 1 tablet (10 mg total) by mouth daily.  . Cholecalciferol 25 MCG (1000 UT) capsule Take 1,000 Units by mouth daily.  Marland Kitchen DEXTRAN 70-HYPROMELLOSE OP Apply 1 drop to eye daily as needed.  . furosemide  (LASIX) 40 MG tablet Take 40 mg by mouth.  . gabapentin (NEURONTIN) 100 MG capsule Take 200 mg by mouth at bedtime.  . hydrocortisone (ANUSOL-HC) 2.5 % rectal cream Place 1 application rectally daily as needed for hemorrhoids or anal itching. With perineal applicator  . lisinopril (ZESTRIL) 20 MG tablet Take 20 mg by mouth daily.  . meloxicam (MOBIC) 7.5 MG tablet Take 7.5 mg by mouth daily.  . methocarbamol (ROBAXIN) 500 MG tablet Take 250 mg by mouth daily as needed for muscle spasms. per Georg Ruddle  . methocarbamol (ROBAXIN) 500 MG tablet Take 250 mg by mouth daily.  . Multiple Vitamin (MULTIVITAMIN) tablet Take 1 tablet by mouth daily.    Marland Kitchen omeprazole (PRILOSEC) 20 MG capsule Take 20 mg by mouth daily.  . potassium chloride (KLOR-CON) 20 MEQ packet Take 20 mEq by mouth daily.  . rosuvastatin (CRESTOR) 5 MG tablet Take 5 mg by mouth daily. Takes rarely   . sodium chloride (DEEP SEA NASAL SPRAY) 0.65 % nasal spray Place 1 spray into the nose daily as needed for congestion.  . traZODone (DESYREL) 50 MG tablet Take 0.5 tablets (25 mg total) by mouth at bedtime.  . vitamin B-12 (CYANOCOBALAMIN) 1000 MCG tablet Take 1,000 mcg by mouth daily.  . Zinc 50 MG TABS Take 100 mg by mouth daily. On Mon and Friday   No facility-administered encounter medications on file as of 07/19/2019.    Review of Systems  Immunization History  Administered Date(s) Administered  . Influenza-Unspecified 04/08/2016  . Pneumococcal Conjugate-13 06/02/2013  . Pneumococcal Polysaccharide-23 04/07/2001   Pertinent  Health Maintenance Due  Topic Date Due  . INFLUENZA VACCINE  Completed  . PNA vac Low Risk Adult  Completed   Fall Risk  11/03/2017 07/11/2016  Falls in the past year? Yes Yes  Number falls in past yr: 2 or more 2 or more  Injury with Fall? Yes Yes  Comment - broke toe on right foot  Risk Factor Category  High Fall Risk -  Follow up Education provided;Falls prevention discussed -   Functional  Status Survey:    Vitals:   07/19/19 1640  BP: 136/74  Pulse: 74  Resp: 18  Temp: (!) 97.1 F (36.2 C)  TempSrc: Oral  SpO2: 94%  Weight: 167 lb 3.2 oz (75.8 kg)  Height: 5\' 7"  (1.702 m)   Body mass index is 26.19 kg/m. Physical Exam  Labs reviewed: Recent Labs    11/01/18 1405 11/01/18 2348 11/02/18 0222 11/02/18 0222 11/03/18 [id number] 11/03/18 [id number] 11/04/18 0312 11/30/18 0000 03/01/19 0000  NA   < >  --  134*   < > 136   < > 136 138 139  K   < >  --  3.3*   < > 3.7   < > 3.3* 4.1 4.4  CL   < >  --  99   < > 101  --  103  --  103  CO2   < >  --  24   < > 24  --  23  --  30  GLUCOSE   < >  --  114*  --  122*  --  107*  --   --   BUN   < >  --  10   < > 13   < > 9 13 14   CREATININE   < >  --  0.87   < > 0.98   < > 0.94 1.0 0.9  CALCIUM   < >  --  8.7*   < > 9.2  --  9.1  --  9.5  MG  --  1.8  --   --   --   --  1.8  --   --    < > = values in this interval not displayed.   Recent Labs    11/01/18 1624 11/04/18 0312 03/01/19 0000  AST 31 26 17   ALT 24 22 16   ALKPHOS 64 48 57  BILITOT 1.2 1.8*  --   PROT 6.6 5.8* 6.2  ALBUMIN 4.1 3.5 3.9   Recent Labs    11/01/18 1405 11/01/18 1405 11/02/18 0222 11/02/18 0222 11/03/18 0337 11/03/18 0337 11/04/18 0312 11/30/18 0000 03/01/19 0000  WBC 16.9*   < > 12.8*   < > 10.8*   < > 9.0 5.5 9.4  NEUTROABS 15.0*  --   --   --   --   --  6.0  --   --   HGB 14.9   < > 12.9*   < > 13.0   < > 12.8* 14.0 13.2*  HCT 42.9   < > 36.7*   < > 37.3*   < > 36.8* 41 38*  MCV 94.3   < > 93.6  --  93.3  --  92.5  --   --   PLT 223   < > 192   < > 202   < > 186 200 283   < > = values in this interval not displayed.   Lab Results  Component Value Date   TSH 1.504 11/01/2018   No results found for: HGBA1C Lab Results  Component Value Date   CHOL 134 01/22/2019   HDL 57 01/22/2019   LDLCALC 63 01/22/2019   TRIG 59 01/22/2019   CHOLHDL 2.7 02/17/2017    Significant Diagnostic Results in last 30 days:  No results  found.  Assessment/Plan There are no diagnoses linked to this encounter.   Family/ staff Communication: ***  Labs/tests ordered:  ***

## 2019-07-20 DIAGNOSIS — Z20828 Contact with and (suspected) exposure to other viral communicable diseases: Secondary | ICD-10-CM | POA: Diagnosis not present

## 2019-07-20 NOTE — Progress Notes (Signed)
This encounter was created in error - please disregard.

## 2019-07-21 ENCOUNTER — Encounter: Payer: Self-pay | Admitting: Internal Medicine

## 2019-07-21 NOTE — Progress Notes (Signed)
A user error has taken place.

## 2019-07-22 ENCOUNTER — Non-Acute Institutional Stay: Payer: Medicare HMO | Admitting: Internal Medicine

## 2019-07-22 ENCOUNTER — Encounter: Payer: Self-pay | Admitting: Internal Medicine

## 2019-07-22 DIAGNOSIS — I5032 Chronic diastolic (congestive) heart failure: Secondary | ICD-10-CM | POA: Diagnosis not present

## 2019-07-22 DIAGNOSIS — I2510 Atherosclerotic heart disease of native coronary artery without angina pectoris: Secondary | ICD-10-CM | POA: Diagnosis not present

## 2019-07-22 DIAGNOSIS — I10 Essential (primary) hypertension: Secondary | ICD-10-CM

## 2019-07-22 DIAGNOSIS — M25551 Pain in right hip: Secondary | ICD-10-CM

## 2019-07-22 DIAGNOSIS — I2583 Coronary atherosclerosis due to lipid rich plaque: Secondary | ICD-10-CM | POA: Diagnosis not present

## 2019-07-22 DIAGNOSIS — R269 Unspecified abnormalities of gait and mobility: Secondary | ICD-10-CM | POA: Diagnosis not present

## 2019-07-22 DIAGNOSIS — E785 Hyperlipidemia, unspecified: Secondary | ICD-10-CM

## 2019-07-22 NOTE — Progress Notes (Signed)
Location:    [facility] Room Number: [place] of Service:  ALF [phone]) Provider:  [name], MD  [name], MD  Patient Care Team: [name], MD as PCP - General (Internal Medicine) Gatha Mayer, MD as Consulting Physician (Gastroenterology) [name], [name], MD as Consulting Physician (Cardiology) [name], MD as Attending Physician (Internal Medicine) Mast, Man X, NP as Nurse Practitioner (Internal Medicine)  Extended Emergency Contact Information Primary Emergency Contact: Requejo,[name] Address: [address]          [place], [place] [address] [name] of [place] Phone: [phone] Mobile Phone: [phone] Relation: Daughter Secondary Emergency Contact: Winberry,[name] Address: [place] [address] [place] Mobile Phone: [phone] Relation: Daughter  Code Status:  Full Code Goals of care: Advanced Directive information Advanced Directives 07/22/2019  Does Patient Have a Medical Advance Directive? Yes  Type of Advance Directive [place]  Does patient want to make changes to medical advance directive? No - Patient declined  Copy of [place] in Chart? Yes - validated most recent copy scanned in chart (See row information)  Pre-existing out of facility DNR order (yellow form or pink MOST form) -     Chief Complaint  Patient presents with  . Medical Management of Chronic Issues    HPI:  Pt is a 86 y.o. male seen today for medical management of chronic diseases.    Patient has a history of BPH, bradycardia, CAD, hypertension, diastolic CHF, GERD,H/o Left Sphenoid MeningiomaAnd Chronic Right Hip Pain  Right Hip Pain Seen by Orthopedics and Thinking was that his pain is related to L2-3 and L4-5 Severe Stenosis as per MRI done in 2019. He was started on Medrol pack which patient says seemed to help. He also underwent Epidural  Injection in his spine on 08/10 Also Was seen By Neurology and they also thought there is nothing they can do except do Pain Control He says his pain is better. And wants me to reduce some of his Meds as he thinks he can just use Tylenol LE edema Now on Lasix QD Has Gained weight Unstable gait Continues to be Wheelchair dependent Had fall recently  No Other Active issues. Doing well in AL  Past Medical History:  Diagnosis Date  . Allergic rhinitis   . Amnesia   . Atherosclerotic heart disease of native coronary artery without angina pectoris   . BPH (benign prostatic hyperplasia)   . Bradycardia   . Chronic diastolic (congestive) heart failure ([place])   . Colon polyps   . Coronary artery disease    MODERATE  . Dizziness   . Edema of lower extremity   . Erectile dysfunction   . H. pylori infection    Hx of   . Hard of hearing   . Headache(784.0)   . History of colon polyps 2009   Dr. Orr/Colonoscopy -small cecal adenoma  . History of falling   . Hx of colonoscopy 2009   Dr Lajoyce Corners, hemorrhoids & polyps  . Hyperlipidemia   . Hypertension   . Hypertensive heart disease with heart failure ([place])   . Hypokalemia   . Internal hemorrhoids 2009   Dr. Lajoyce Corners Colonoscopy  . Metabolic encephalopathy   . Mitral valve regurgitation   . Muscle weakness (generalized)   . Pneumonia   . Primary generalized (osteo)arthritis   . Reflux esophagitis   .  Sepsis ([place]) 11/04/2018  . Unspecified hemorrhoids   . Unsteadiness on feet   . Vitamin D deficiency    Past Surgical History:  Procedure Laterality Date  . APPENDECTOMY    . CARDIAC CATHETERIZATION  1997   REVEALED MILD TO MODERATE IRREGULARITIES. HIS LEFT EVNTRICULAR SYSTOLIC FUNCTION REVEALED NORMAL EJECTION FRACTION WITH EF OF 70%  . CATARACT EXTRACTION, BILATERAL    . COLONOSCOPY  multiple  . fatty tumor removal     benign  . HEMORROIDECTOMY    . HIP ARTHROPLASTY Left 08/17/2013   Procedure: ARTHROPLASTY BIPOLAR HIP;  Surgeon: Gearlean Alf, MD;  Location: [facility];  Service: Orthopedics;  Laterality: Left;  . INGUINAL HERNIA REPAIR Bilateral 2006  . lower back surgery  2006  . NASAL SEPTUM SURGERY    . TONSILLECTOMY    . UPPER GASTROINTESTINAL ENDOSCOPY      No Known Allergies  Allergies as of 07/22/2019   No Known Allergies     Medication List       Accurate as of July 22, 2019  1:41 PM. If you have any questions, ask your nurse or doctor.        acetaminophen 500 MG tablet Commonly known as: TYLENOL Take 500 mg by mouth every 8 (eight) hours as needed.   acetaminophen 650 MG CR tablet Commonly known as: TYLENOL Take 650 mg by mouth 2 (two) times daily.   amLODipine 10 MG tablet Commonly known as: NORVASC Take 1 tablet (10 mg total) by mouth daily.   Anusol-HC 2.5 % rectal cream Generic drug: hydrocortisone Place 1 application rectally daily as needed for hemorrhoids or anal itching. With perineal applicator   Cholecalciferol 25 MCG (1000 UT) capsule Take 1,000 Units by mouth daily.   [place] Nasal Spray 0.65 % nasal spray Generic drug: sodium chloride Place 1 spray into the nose daily as needed for congestion.   DEXTRAN 70-HYPROMELLOSE OP Apply 1 drop to eye daily as needed.   furosemide 40 MG tablet Commonly known as: LASIX Take 40 mg by mouth.   gabapentin 100 MG capsule Commonly known as: NEURONTIN Take 200 mg by mouth at bedtime.   lisinopril 20 MG tablet Commonly known as: ZESTRIL Take 20 mg by mouth daily.   meloxicam 7.5 MG tablet Commonly known as: MOBIC Take 7.5 mg by mouth daily.   Robaxin 500 MG tablet Generic drug: methocarbamol Take 250 mg by mouth daily.   methocarbamol 500 MG tablet Commonly known as: ROBAXIN Take 250 mg by mouth daily as needed for muscle spasms. per Georg Ruddle   multivitamin tablet Take 1 tablet by mouth daily.   omeprazole 20 MG capsule Commonly known as: PRILOSEC Take 20 mg by mouth daily.   potassium chloride 20 MEQ  packet Commonly known as: KLOR-CON Take 20 mEq by mouth daily.   rosuvastatin 5 MG tablet Commonly known as: CRESTOR Take 5 mg by mouth daily. Takes rarely   traZODone 50 MG tablet Commonly known as: DESYREL Take 0.5 tablets (25 mg total) by mouth at bedtime.   vitamin B-12 1000 MCG tablet Commonly known as: CYANOCOBALAMIN Take 1,000 mcg by mouth daily.   Zinc 50 MG Tabs Take 100 mg by mouth daily. On Mon and Friday       Review of Systems  Constitutional: Negative.   HENT: Negative.   Respiratory: Negative.   Cardiovascular: Positive for leg swelling.  Gastrointestinal: Negative.   Genitourinary: Negative.   Musculoskeletal: Positive for gait problem.  Psychiatric/Behavioral: Negative.  All other systems reviewed and are negative.   Immunization History  Administered Date(s) Administered  . Influenza, High Dose Seasonal PF 03/19/2019  . Influenza-Unspecified 04/08/2016  . Moderna SARS-COVID-2 Vaccination 06/18/2019  . Pneumococcal Conjugate-13 06/02/2013  . Pneumococcal Polysaccharide-23 04/07/2001   Pertinent  Health Maintenance Due  Topic Date Due  . INFLUENZA VACCINE  Completed  . PNA vac Low Risk Adult  Completed   Fall Risk  11/03/2017 07/11/2016  Falls in the past year? Yes Yes  Number falls in past yr: 2 or more 2 or more  Injury with Fall? Yes Yes  Comment - broke toe on right foot  Risk Factor Category  High Fall Risk -  Follow up Education provided;Falls prevention discussed -   Functional Status Survey:    Vitals:   07/22/19 1335  BP: 136/74  Pulse: 74  Resp: 18  Temp: 97.9 F (36.6 C)  SpO2: 96%  Weight: 170 lb (77.1 kg)  Height: 5\' 7"  (1.702 m)   Body mass index is 26.63 kg/m. Physical Exam  Constitutional: Oriented to person, place, and time. Well-developed and well-nourished.  HENT:  Head: Normocephalic.  Mouth/Throat: Oropharynx is clear and moist.  Eyes: Pupils are equal, round, and reactive to light.  Neck: Neck supple.   Cardiovascular: Normal rate and normal heart sounds.  No murmur heard. Pulmonary/Chest: Effort normal and breath sounds normal. No respiratory distress. No wheezes. She has no rales.  Abdominal: Soft. Bowel sounds are normal. No distension. There is no tenderness. There is no rebound.  Musculoskeletal: Bilateral LE edema Lymphadenopathy: none Neurological: Alert and oriented to person, place, and time.  Can get up from his chair but has unsteady gait with walker Skin: Skin is warm and dry.  Psychiatric: Normal mood and affect. Behavior is normal. Thought content normal.    Labs reviewed: Recent Labs    11/01/18 1405 11/01/18 2348 11/02/18 0222 11/02/18 0222 11/03/18 [id number] 11/03/18 0337 11/04/18 0312 11/30/18 0000 03/01/19 0000  NA   < >  --  134*   < > 136   < > 136 138 139  K   < >  --  3.3*   < > 3.7   < > 3.3* 4.1 4.4  CL   < >  --  99   < > 101  --  103  --  103  CO2   < >  --  24   < > 24  --  23  --  30  GLUCOSE   < >  --  114*  --  122*  --  107*  --   --   BUN   < >  --  10   < > 13   < > 9 13 14   CREATININE   < >  --  0.87   < > 0.98   < > 0.94 1.0 0.9  CALCIUM   < >  --  8.7*   < > 9.2  --  9.1  --  9.5  MG  --  1.8  --   --   --   --  1.8  --   --    < > = values in this interval not displayed.   Recent Labs    11/01/18 1624 11/04/18 0312 03/01/19 0000  AST 31 26 17   ALT 24 22 16   ALKPHOS 64 48 57  BILITOT 1.2 1.8*  --   PROT 6.6 5.8* 6.2  ALBUMIN 4.1 3.5 3.9  Recent Labs    11/01/18 1405 11/01/18 1405 11/02/18 0222 11/02/18 0222 11/03/18 [id number] 11/03/18 0337 11/04/18 0312 11/30/18 0000 03/01/19 0000  WBC 16.9*   < > 12.8*   < > 10.8*   < > 9.0 5.5 9.4  NEUTROABS 15.0*  --   --   --   --   --  6.0  --   --   HGB 14.9   < > 12.9*   < > 13.0   < > 12.8* 14.0 13.2*  HCT 42.9   < > 36.7*   < > 37.3*   < > 36.8* 41 38*  MCV 94.3   < > 93.6  --  93.3  --  92.5  --   --   PLT 223   < > 192   < > 202   < > 186 200 283   < > = values in this interval  not displayed.   Lab Results  Component Value Date   TSH 1.504 11/01/2018   No results found for: HGBA1C Lab Results  Component Value Date   CHOL 121 06/28/2019   HDL 46 06/28/2019   LDLCALC 61 06/28/2019   TRIG 62 06/28/2019   CHOLHDL 2.7 02/17/2017    Significant Diagnostic Results in last 30 days:  No results found.  Assessment/Plan Essential hypertension Controlled on Lisinopril and Norvasc Will try to decrease his Norvasc to 5 mg to see if it helps his Edema Increase his Lisinopril to 40 mg  Coronary artery disease due to lipid rich plaque Follows with Cardiology Doing well On Statin Will start his Aspirin back  Chronic diastolic heart failure (HCC) with LE edema ON Lasix and Ted hoses Bun and creat stable Will decrease the dose of Norvasc to see if it helps his Edema Can go up on Lisinopril Right hip pain Says tylenol is enough Will start with discontinuing Mobic Will eventually take him off Robaxin Hyperlipidemia, LDL was  On Statin Gait abnormality Continues to be Wheelchair dependent BPH Was recently seen by Urology in [facility] and treated for UTI No Symptoms now H/O Meningoma ASymptomatic  Family/ staff Communication:   Labs/tests ordered:  BMP and CBC in 2 weeks  Total time spent in this patient care encounter was 45 _  minutes; greater than 50% of the visit spent counseling patient and staff, reviewing records , Labs and coordinating care for problems addressed at this encounter.

## 2019-07-27 DIAGNOSIS — Z20828 Contact with and (suspected) exposure to other viral communicable diseases: Secondary | ICD-10-CM | POA: Diagnosis not present

## 2019-07-28 DIAGNOSIS — H04123 Dry eye syndrome of bilateral lacrimal glands: Secondary | ICD-10-CM | POA: Diagnosis not present

## 2019-07-28 DIAGNOSIS — Z961 Presence of intraocular lens: Secondary | ICD-10-CM | POA: Diagnosis not present

## 2019-07-28 DIAGNOSIS — H35363 Drusen (degenerative) of macula, bilateral: Secondary | ICD-10-CM | POA: Diagnosis not present

## 2019-07-29 DIAGNOSIS — M545 Low back pain: Secondary | ICD-10-CM | POA: Diagnosis not present

## 2019-08-03 DIAGNOSIS — I10 Essential (primary) hypertension: Secondary | ICD-10-CM | POA: Diagnosis not present

## 2019-08-03 DIAGNOSIS — Z20828 Contact with and (suspected) exposure to other viral communicable diseases: Secondary | ICD-10-CM | POA: Diagnosis not present

## 2019-08-26 DIAGNOSIS — M545 Low back pain: Secondary | ICD-10-CM | POA: Diagnosis not present

## 2019-09-18 ENCOUNTER — Inpatient Hospital Stay (HOSPITAL_COMMUNITY)
Admission: EM | Admit: 2019-09-18 | Discharge: 2019-09-23 | DRG: 871 | Disposition: A | Discharge status: Skilled Nursing Facility | Payer: Medicare HMO | Source: Skilled Nursing Facility | Attending: Internal Medicine | Admitting: Internal Medicine

## 2019-09-18 ENCOUNTER — Emergency Department (HOSPITAL_COMMUNITY): Payer: Medicare HMO

## 2019-09-18 DIAGNOSIS — N179 Acute kidney failure, unspecified: Secondary | ICD-10-CM | POA: Diagnosis present

## 2019-09-18 DIAGNOSIS — Z9181 History of falling: Secondary | ICD-10-CM

## 2019-09-18 DIAGNOSIS — G936 Cerebral edema: Secondary | ICD-10-CM | POA: Diagnosis present

## 2019-09-18 DIAGNOSIS — H9190 Unspecified hearing loss, unspecified ear: Secondary | ICD-10-CM | POA: Diagnosis present

## 2019-09-18 DIAGNOSIS — Z803 Family history of malignant neoplasm of breast: Secondary | ICD-10-CM

## 2019-09-18 DIAGNOSIS — D320 Benign neoplasm of cerebral meninges: Secondary | ICD-10-CM | POA: Diagnosis present

## 2019-09-18 DIAGNOSIS — R569 Unspecified convulsions: Secondary | ICD-10-CM

## 2019-09-18 DIAGNOSIS — Z03818 Encounter for observation for suspected exposure to other biological agents ruled out: Secondary | ICD-10-CM | POA: Diagnosis not present

## 2019-09-18 DIAGNOSIS — K2100 Gastro-esophageal reflux disease with esophagitis, without bleeding: Secondary | ICD-10-CM | POA: Diagnosis present

## 2019-09-18 DIAGNOSIS — Z66 Do not resuscitate: Secondary | ICD-10-CM | POA: Diagnosis present

## 2019-09-18 DIAGNOSIS — R404 Transient alteration of awareness: Secondary | ICD-10-CM | POA: Diagnosis not present

## 2019-09-18 DIAGNOSIS — I5032 Chronic diastolic (congestive) heart failure: Secondary | ICD-10-CM | POA: Diagnosis present

## 2019-09-18 DIAGNOSIS — I499 Cardiac arrhythmia, unspecified: Secondary | ICD-10-CM | POA: Diagnosis not present

## 2019-09-18 DIAGNOSIS — E860 Dehydration: Secondary | ICD-10-CM | POA: Diagnosis present

## 2019-09-18 DIAGNOSIS — R29818 Other symptoms and signs involving the nervous system: Secondary | ICD-10-CM | POA: Diagnosis not present

## 2019-09-18 DIAGNOSIS — G40109 Localization-related (focal) (partial) symptomatic epilepsy and epileptic syndromes with simple partial seizures, not intractable, without status epilepticus: Secondary | ICD-10-CM | POA: Diagnosis present

## 2019-09-18 DIAGNOSIS — E785 Hyperlipidemia, unspecified: Secondary | ICD-10-CM | POA: Diagnosis present

## 2019-09-18 DIAGNOSIS — I110 Hypertensive heart disease with heart failure: Secondary | ICD-10-CM | POA: Diagnosis present

## 2019-09-18 DIAGNOSIS — N390 Urinary tract infection, site not specified: Secondary | ICD-10-CM

## 2019-09-18 DIAGNOSIS — R339 Retention of urine, unspecified: Secondary | ICD-10-CM | POA: Diagnosis not present

## 2019-09-18 DIAGNOSIS — A4159 Other Gram-negative sepsis: Principal | ICD-10-CM | POA: Diagnosis present

## 2019-09-18 DIAGNOSIS — N289 Disorder of kidney and ureter, unspecified: Secondary | ICD-10-CM | POA: Diagnosis not present

## 2019-09-18 DIAGNOSIS — Z1389 Encounter for screening for other disorder: Secondary | ICD-10-CM

## 2019-09-18 DIAGNOSIS — M150 Primary generalized (osteo)arthritis: Secondary | ICD-10-CM | POA: Diagnosis present

## 2019-09-18 DIAGNOSIS — I083 Combined rheumatic disorders of mitral, aortic and tricuspid valves: Secondary | ICD-10-CM | POA: Diagnosis present

## 2019-09-18 DIAGNOSIS — Z800 Family history of malignant neoplasm of digestive organs: Secondary | ICD-10-CM

## 2019-09-18 DIAGNOSIS — Z0189 Encounter for other specified special examinations: Secondary | ICD-10-CM

## 2019-09-18 DIAGNOSIS — I482 Chronic atrial fibrillation, unspecified: Secondary | ICD-10-CM

## 2019-09-18 DIAGNOSIS — G9341 Metabolic encephalopathy: Secondary | ICD-10-CM

## 2019-09-18 DIAGNOSIS — I4891 Unspecified atrial fibrillation: Secondary | ICD-10-CM | POA: Diagnosis not present

## 2019-09-18 DIAGNOSIS — I480 Paroxysmal atrial fibrillation: Secondary | ICD-10-CM | POA: Diagnosis present

## 2019-09-18 DIAGNOSIS — I2510 Atherosclerotic heart disease of native coronary artery without angina pectoris: Secondary | ICD-10-CM | POA: Diagnosis present

## 2019-09-18 DIAGNOSIS — R52 Pain, unspecified: Secondary | ICD-10-CM | POA: Diagnosis not present

## 2019-09-18 DIAGNOSIS — N400 Enlarged prostate without lower urinary tract symptoms: Secondary | ICD-10-CM | POA: Diagnosis present

## 2019-09-18 DIAGNOSIS — Z96642 Presence of left artificial hip joint: Secondary | ICD-10-CM | POA: Diagnosis present

## 2019-09-18 DIAGNOSIS — R4182 Altered mental status, unspecified: Secondary | ICD-10-CM | POA: Diagnosis not present

## 2019-09-18 DIAGNOSIS — E872 Acidosis: Secondary | ICD-10-CM | POA: Diagnosis present

## 2019-09-18 DIAGNOSIS — Z87891 Personal history of nicotine dependence: Secondary | ICD-10-CM

## 2019-09-18 DIAGNOSIS — Z20822 Contact with and (suspected) exposure to covid-19: Secondary | ICD-10-CM | POA: Diagnosis present

## 2019-09-18 LAB — I-STAT CHEM 8, ED
BUN: 24 mg/dL — ABNORMAL HIGH (ref 8–23)
Calcium, Ion: 1.16 mmol/L (ref 1.15–1.40)
Chloride: 99 mmol/L (ref 98–111)
Creatinine, Ser: 1.5 mg/dL — ABNORMAL HIGH (ref 0.61–1.24)
Glucose, Bld: 164 mg/dL — ABNORMAL HIGH (ref 70–99)
HCT: 42 % (ref 39.0–52.0)
Hemoglobin: 14.3 g/dL (ref 13.0–17.0)
Potassium: 4 mmol/L (ref 3.5–5.1)
Sodium: 133 mmol/L — ABNORMAL LOW (ref 135–145)
TCO2: 22 mmol/L (ref 22–32)

## 2019-09-18 LAB — DIFFERENTIAL
Abs Immature Granulocytes: 0.28 10*3/uL — ABNORMAL HIGH (ref 0.00–0.07)
Basophils Absolute: 0.1 10*3/uL (ref 0.0–0.1)
Basophils Relative: 0 %
Eosinophils Absolute: 0 10*3/uL (ref 0.0–0.5)
Eosinophils Relative: 0 %
Immature Granulocytes: 1 %
Lymphocytes Relative: 4 %
Lymphs Abs: 1 10*3/uL (ref 0.7–4.0)
Monocytes Absolute: 2.2 10*3/uL — ABNORMAL HIGH (ref 0.1–1.0)
Monocytes Relative: 8 %
Neutro Abs: 24.2 10*3/uL — ABNORMAL HIGH (ref 1.7–7.7)
Neutrophils Relative %: 87 %

## 2019-09-18 LAB — CBC
HCT: 43.2 % (ref 39.0–52.0)
Hemoglobin: 14.6 g/dL (ref 13.0–17.0)
MCH: 32.9 pg (ref 26.0–34.0)
MCHC: 33.8 g/dL (ref 30.0–36.0)
MCV: 97.3 fL (ref 80.0–100.0)
Platelets: 268 10*3/uL (ref 150–400)
RBC: 4.44 MIL/uL (ref 4.22–5.81)
RDW: 12 % (ref 11.5–15.5)
WBC: 27.7 10*3/uL — ABNORMAL HIGH (ref 4.0–10.5)
nRBC: 0 % (ref 0.0–0.2)

## 2019-09-18 LAB — PROTIME-INR
INR: 1.1 (ref 0.8–1.2)
Prothrombin Time: 14.5 seconds (ref 11.4–15.2)

## 2019-09-18 LAB — ETHANOL: Alcohol, Ethyl (B): <10 mg/dL (ref <10)

## 2019-09-18 LAB — APTT: aPTT: 31 seconds (ref 24–36)

## 2019-09-18 MED ORDER — LEVETIRACETAM IN NACL 1500 MG/100ML IV SOLN
1500.0000 mg | INTRAVENOUS | Status: AC
  Administered 2019-09-18: 1500 mg via INTRAVENOUS
  Filled 2019-09-18: qty 100

## 2019-09-18 MED ORDER — IOHEXOL 350 MG/ML SOLN: 75.0000 mL | Freq: Once | INTRAVENOUS | Status: DC | PRN

## 2019-09-18 MED ORDER — LORAZEPAM 2 MG/ML IJ SOLN
INTRAMUSCULAR | Status: AC
  Administered 2019-09-18: 1 mg
  Filled 2019-09-18: qty 1

## 2019-09-18 MED ORDER — IOHEXOL 350 MG/ML SOLN
75.0000 mL | Freq: Once | INTRAVENOUS | Status: AC | PRN
  Administered 2019-09-18: 75 mL via INTRAVENOUS

## 2019-09-18 NOTE — Consult Note (Signed)
Neurology Consultation Reason for Consult: AMS Referring Physician: Roxanne Mins, D  CC: Seizure  History is obtained from: Nursing home  HPI: [name] [name] is a 86 y.o. male he was found down at his nursing home after having fallen from a chair.  Reason for fall is slightly unclear, but he was confused after the fall.  He was placed in the bed and almost immediately thereafter, had convulsive activity involving the right side only.  This lasted for approximately 30 seconds and following this he was somnolent, essentially unarousable with sonorous respirations.  EMS was activated and on arrival a code stroke was activated given his sudden change in mental status.  Of note, his daughter describes that earlier today, he was in his normal state and speaking normally.  While she was talking to him, he had about a minute long episode where he was staring off into space and smacking his lips.  She tried to get his attention and was unable to.  He then abruptly stopped and looked at his watch and resumed normal conversation.  Last known well: 830 TPA given: No, seizure at onset  ROS: Unable to obtain due to altered mental status.   Past Medical History:  Diagnosis Date  . Allergic rhinitis   . Amnesia   . Atherosclerotic heart disease of native coronary artery without angina pectoris   . BPH (benign prostatic hyperplasia)   . Bradycardia   . Chronic diastolic (congestive) heart failure ([place])   . Colon polyps   . Coronary artery disease    MODERATE  . Dizziness   . Edema of lower extremity   . Erectile dysfunction   . H. pylori infection    Hx of   . Hard of hearing   . Headache(784.0)   . History of colon polyps 2009   Dr. Orr/Colonoscopy -small cecal adenoma  . History of falling   . Hx of colonoscopy 2009   Dr Lajoyce Corners, hemorrhoids & polyps  . Hyperlipidemia   . Hypertension   . Hypertensive heart disease with heart failure ([place])   . Hypokalemia   . Internal hemorrhoids 2009   Dr.  Lajoyce Corners Colonoscopy  . Metabolic encephalopathy   . Mitral valve regurgitation   . Muscle weakness (generalized)   . Pneumonia   . Primary generalized (osteo)arthritis   . Reflux esophagitis   . Sepsis ([place]) 11/04/2018  . Unspecified hemorrhoids   . Unsteadiness on feet   . Vitamin D deficiency      Family History  Problem Relation Age of Onset  . Colon cancer Mother 70       second cancer in 49's deceased after emergency surgery  . Breast cancer Sister      Social History:  reports that he quit smoking about 43 years ago. He has never used smokeless tobacco. He reports current alcohol use. He reports that he does not use drugs.   Exam: Current vital signs: Today's Vitals   09/18/19 2350 09/18/19 2351 09/19/19 0000  BP: (!) 128/45    Pulse: (!) 105    Resp: 17    SpO2: 95%    Weight:  78.9 kg   Height:   5\' 7"  (1.702 m)   Body mass index is 27.25 kg/m.  Vital signs in last 24 hours:     Physical Exam  Constitutional: Appears well-developed and well-nourished.  Psych: Affect appropriate to situation Eyes: No scleral injection HENT: No OP obstrucion MSK: no joint deformities.  Cardiovascular: Normal rate and regular  rhythm.  Respiratory: Effort normal, non-labored breathing GI: Soft.  No distension. There is no tenderness.  Skin: WDI  Neuro: Mental Status: Patient is awake, he is able to tell me his name, and occasionally follows commands but not reliably.  He does not have any verbal output other than his name. Cranial Nerves: II: Blinks to threat bilaterally pupils are equal, round, and reactive to light.   III,IV, VI: EOMI without ptosis or diploplia.  V: Facial sensation is symmetric to temperature VII: Facial movement is symmetric.  Motor: Though he does not cooperate with formal testing, he does move all extremities Sensory: He response to noxious stimulation in all 4 extremities Cerebellar: No clear ataxia     I have reviewed labs in epic and  the results pertinent to this consultation are: UA with evidence of UTI  I have reviewed the images obtained: CT head-negative acute  Impression: 86 year old male with a history of meningioma who presents with new onset seizures.  I suspect that the episode of lipsmacking, confusion that the daughter witnessed earlier today was likely a partial seizure.  He will need to be started on antiepileptic.  He is postictal, but has shown some improvement with following commands and telling me his name.  He received Ativan 1 mg and is now quite somnolent.  Recommendations: 1) Keppra 500 twice daily after 1500 mg load 2) eeg, mri 3) neuro to follow.   Roland Rack, MD [facility] [phone]  If 7pm- 7am, please page neurology on call as listed in [place].

## 2019-09-18 NOTE — ED Triage Notes (Signed)
Pt arrived from  [facility]. Per facility pt had an unwitnessed fall at approx 2200. Following fall pt had left side focal motor seizure. Pt baseline is A+Ox4 but at this time will not answer questions or have purposeful movement

## 2019-09-18 NOTE — ED Notes (Signed)
Code stroke activated per Dr. Roxanne Mins

## 2019-09-18 NOTE — ED Provider Notes (Signed)
[facility] EMERGENCY DEPARTMENT Provider Note   CSN: [id number] Arrival date & time: 09/18/19  2252   History Chief Complaint  Patient presents with  . Seizures  . Fall    [name] [name] is a 86 y.o. male.  The history is provided by the EMS personnel and the nursing home. The history is limited by the condition of the patient (Altered mental status).  He has history of hypertension, hyperlipidemia, diastolic heart failure and presents as a possible acute stroke.  Per nursing home, he fell at about 9:15 PM.  Following that, he was noted to have some focal jerking of his left arm and he has been noncommunicative.  He was incontinent in his stool.  He is at baseline alert and oriented and conversant.  He seems to not be moving his left side as well.  He has no seizure history and he is not on anticoagulants.  Past Medical History:  Diagnosis Date  . Allergic rhinitis   . Amnesia   . Atherosclerotic heart disease of native coronary artery without angina pectoris   . BPH (benign prostatic hyperplasia)   . Bradycardia   . Chronic diastolic (congestive) heart failure ([place])   . Colon polyps   . Coronary artery disease    MODERATE  . Dizziness   . Edema of lower extremity   . Erectile dysfunction   . H. pylori infection    Hx of   . Hard of hearing   . Headache(784.0)   . History of colon polyps 2009   Dr. Orr/Colonoscopy -small cecal adenoma  . History of falling   . Hx of colonoscopy 2009   Dr Lajoyce Corners, hemorrhoids & polyps  . Hyperlipidemia   . Hypertension   . Hypertensive heart disease with heart failure ([place])   . Hypokalemia   . Internal hemorrhoids 2009   Dr. Lajoyce Corners Colonoscopy  . Metabolic encephalopathy   . Mitral valve regurgitation   . Muscle weakness (generalized)   . Pneumonia   . Primary generalized (osteo)arthritis   . Reflux esophagitis   . Sepsis ([place]) 11/04/2018  . Unspecified hemorrhoids   . Unsteadiness on feet   . Vitamin D  deficiency     Patient Active Problem List   Diagnosis Date Noted  . BPH (benign prostatic hyperplasia) 03/23/2019  . Gait abnormality 02/28/2019  . Fall 02/22/2019  . Anemia 02/03/2019  . Lower back pain 12/10/2018  . Right hip pain 11/26/2018  . Palliative care by specialist   . Right leg weakness 11/03/2017  . Bleeding internal hemorrhoids 10/28/2017  . Chronic diastolic heart failure ([place]) 08/17/2013  . Femur fracture, left ([place]) 08/16/2013  . Bradycardia, severe sinus 10/30/2011  . Personal history of colonic adenoma 01/09/2011  . Family history of malignant neoplasm of colon cancer-mother 01/09/2011  . Coronary artery disease   . Hyperlipidemia   . Hypertension     Past Surgical History:  Procedure Laterality Date  . APPENDECTOMY    . CARDIAC CATHETERIZATION  1997   REVEALED MILD TO MODERATE IRREGULARITIES. HIS LEFT EVNTRICULAR SYSTOLIC FUNCTION REVEALED NORMAL EJECTION FRACTION WITH EF OF 70%  . CATARACT EXTRACTION, BILATERAL    . COLONOSCOPY  multiple  . fatty tumor removal     benign  . HEMORROIDECTOMY    . HIP ARTHROPLASTY Left 08/17/2013   Procedure: ARTHROPLASTY BIPOLAR HIP;  Surgeon: Gearlean Alf, MD;  Location: [facility] ORS;  Service: Orthopedics;  Laterality: Left;  . INGUINAL HERNIA REPAIR Bilateral 2006  .  lower back surgery  2006  . NASAL SEPTUM SURGERY    . TONSILLECTOMY    . UPPER GASTROINTESTINAL ENDOSCOPY         Family History  Problem Relation Age of Onset  . Colon cancer Mother 82       second cancer in 66's deceased after emergency surgery  . Breast cancer Sister     Social History   Tobacco Use  . Smoking status: Former Smoker    Quit date: 10/25/1975    Years since quitting: 43.9  . Smokeless tobacco: Never Used  Substance Use Topics  . Alcohol use: Yes    Comment: occ 6-7 times a year  . Drug use: No    Home Medications Prior to Admission medications   Medication Sig Start Date End Date Taking? Authorizing Provider    acetaminophen (TYLENOL) 500 MG tablet Take 500 mg by mouth every 8 (eight) hours as needed.     [provider]  acetaminophen (TYLENOL) 650 MG CR tablet Take 650 mg by mouth 2 (two) times daily.    [provider]  amLODipine (NORVASC) 10 MG tablet Take 1 tablet (10 mg total) by mouth daily. 03/08/18   [name], [name], MD  Cholecalciferol 25 MCG (1000 UT) capsule Take 1,000 Units by mouth daily.    [provider]  DEXTRAN 70-HYPROMELLOSE OP Apply 1 drop to eye daily as needed. 11/26/18   [provider]  furosemide (LASIX) 40 MG tablet Take 40 mg by mouth.    [provider]  gabapentin (NEURONTIN) 100 MG capsule Take 200 mg by mouth at bedtime. 02/11/19   [provider]  hydrocortisone (ANUSOL-HC) 2.5 % rectal cream Place 1 application rectally daily as needed for hemorrhoids or anal itching. With perineal applicator    [provider]  lisinopril (ZESTRIL) 20 MG tablet Take 20 mg by mouth daily.    [provider]  methocarbamol (ROBAXIN) 500 MG tablet Take 250 mg by mouth daily as needed for muscle spasms. per Georg Ruddle 02/11/19   [provider]  methocarbamol (ROBAXIN) 500 MG tablet Take 250 mg by mouth daily.    [provider]  Multiple Vitamin (MULTIVITAMIN) tablet Take 1 tablet by mouth daily.      [provider]  omeprazole (PRILOSEC) 20 MG capsule Take 20 mg by mouth daily.    [provider]  potassium chloride (KLOR-CON) 20 MEQ packet Take 20 mEq by mouth daily.    [provider]  rosuvastatin (CRESTOR) 5 MG tablet Take 5 mg by mouth daily. Takes rarely     [provider]  sodium chloride (DEEP SEA NASAL SPRAY) 0.65 % nasal spray Place 1 spray into the nose daily as needed for congestion.    [provider]  traZODone (DESYREL) 50 MG tablet Take 0.5 tablets (25 mg total) by mouth at bedtime. 11/04/18   Aline August, MD  vitamin B-12  (CYANOCOBALAMIN) 1000 MCG tablet Take 1,000 mcg by mouth daily.    [provider]  Zinc 50 MG TABS Take 100 mg by mouth daily. On Mon and Friday    [provider]    Allergies    Patient has no known allergies.  Review of Systems   Review of Systems  Unable to perform ROS: Mental status change    Physical Exam Updated Vital Signs BP (!) 128/50 (BP Location: Right Arm)   Pulse (!) 112   Temp 98 F (36.7 C) (Oral)  Resp 17   Ht 5\' 7"  (1.702 m)   Wt 76.4 kg   SpO2 96%   BMI 26.38 kg/m   Physical Exam Vitals and nursing note reviewed.   86 year old male, resting comfortably and in no acute distress. Vital signs are significant for slightly elevated heart rate. Oxygen saturation is 96%, which is normal. Head is normocephalic and atraumatic. PERRLA, EOMI. Oropharynx is clear. Neck is nontender and supple without adenopathy or JVD.  There are no carotid bruits. Back is nontender and there is no CVA tenderness. Lungs are clear without rales, wheezes, or rhonchi. Chest is nontender. Heart has regular rate and rhythm without murmur. Abdomen is soft, flat, nontender without masses or hepatosplenomegaly and peristalsis is normoactive. Extremities have no cyanosis or edema, full range of motion is present. Skin is warm and dry without rash. Neurologic: Awake but nonverbal, will not follow commands.  He moves all extremities equally, but not purposefully.  Cranial nerves are intact, there are no gross motor or sensory deficits.  Plantar responses flexor bilaterally.  ED Results / Procedures / Treatments   Labs (all labs ordered are listed, but only abnormal results are displayed) Labs Reviewed  CBC - Abnormal; Notable for the following components:      Result Value   WBC 27.7 (*)    All other components within normal limits  DIFFERENTIAL - Abnormal; Notable for the following components:   Neutro Abs 24.2 (*)    Monocytes Absolute 2.2 (*)    Abs Immature  Granulocytes 0.28 (*)    All other components within normal limits  COMPREHENSIVE METABOLIC PANEL - Abnormal; Notable for the following components:   CO2 20 (*)    Glucose, Bld 174 (*)    Creatinine, Ser 1.47 (*)    Total Protein 6.3 (*)    GFR calc non Af Amer 41 (*)    GFR calc Af Amer 47 (*)    All other components within normal limits  URINALYSIS, ROUTINE W REFLEX MICROSCOPIC - Abnormal; Notable for the following components:   APPearance CLOUDY (*)    Specific Gravity, Urine >1.046 (*)    Protein, ur 30 (*)    Leukocytes,Ua LARGE (*)    WBC, UA >50 (*)    Bacteria, UA MANY (*)    All other components within normal limits  LACTIC ACID, PLASMA - Abnormal; Notable for the following components:   Lactic Acid, Venous 3.7 (*)    All other components within normal limits  CBC - Abnormal; Notable for the following components:   WBC 25.8 (*)    All other components within normal limits  BASIC METABOLIC PANEL - Abnormal; Notable for the following components:   Potassium 5.2 (*)    Glucose, Bld 133 (*)    GFR calc non Af Amer 50 (*)    GFR calc Af Amer 58 (*)    All other components within normal limits  I-STAT CHEM 8, ED - Abnormal; Notable for the following components:   Sodium 133 (*)    BUN 24 (*)    Creatinine, Ser 1.50 (*)    Glucose, Bld 164 (*)    All other components within normal limits  URINE CULTURE  CULTURE, BLOOD (ROUTINE X 2)  CULTURE, BLOOD (ROUTINE X 2)  SARS CORONAVIRUS 2 (TAT 6-24 HRS)  ETHANOL  PROTIME-INR  APTT  RAPID URINE DRUG SCREEN, HOSP PERFORMED  HEPARIN LEVEL (UNFRACTIONATED)    EKG EKG Interpretation  Date/Time:  Sunday September 18 2019  23:39:43 EDT Ventricular Rate:  82 PR Interval:    QRS Duration: 110 QT Interval:  405 QTC Calculation: 473 R Axis:   66 Text Interpretation: Atrial fibrillation RSR' in V1 or V2, right VCD or RVH When compared with ECG of 11/01/2018, Atrial fibrillation has replaced Sinus rhythm Confirmed by Delora Fuel  ([id number]) on 09/18/2019 11:42:40 PM   Radiology CT Code Stroke CTA Head W/WO contrast  Result Date: 09/18/2019 CLINICAL DATA:  Seizure and altered mental status EXAM: CT ANGIOGRAPHY HEAD AND NECK TECHNIQUE: Multidetector CT imaging of the head and neck was performed using the standard protocol during bolus administration of intravenous contrast. Multiplanar CT image reconstructions and MIPs were obtained to evaluate the vascular anatomy. Carotid stenosis measurements (when applicable) are obtained utilizing NASCET criteria, using the distal internal carotid diameter as the denominator. CONTRAST:  50mL OMNIPAQUE IOHEXOL 350 MG/ML SOLN COMPARISON:  Head CT 09/18/2019 FINDINGS: The examination is markedly degraded by motion. CTA NECK FINDINGS SKELETON: There is no bony spinal canal stenosis. No lytic or blastic lesion. OTHER NECK: Normal pharynx, larynx and major salivary glands. No cervical lymphadenopathy. Unremarkable thyroid gland. UPPER CHEST: No pneumothorax or pleural effusion. No nodules or masses. AORTIC ARCH: There is mild calcific atherosclerosis of the aortic arch. There is no aneurysm, dissection or hemodynamically significant stenosis of the visualized portion of the aorta. Conventional 3 vessel aortic branching pattern. The visualized proximal subclavian arteries are widely patent. RIGHT CAROTID SYSTEM: No dissection, occlusion or aneurysm. Mild atherosclerotic calcification at the carotid bifurcation without hemodynamically significant stenosis. LEFT CAROTID SYSTEM: No dissection, occlusion or aneurysm. Mild atherosclerotic calcification at the carotid bifurcation without hemodynamically significant stenosis. VERTEBRAL ARTERIES: Left dominant configuration. Both origins are clearly patent. There is no dissection, occlusion or flow-limiting stenosis to the skull base (V1-V3 segments). CTA HEAD FINDINGS POSTERIOR CIRCULATION: --Vertebral arteries: Normal V4 segments. --Posterior inferior cerebellar  arteries (PICA): Patent origins from the vertebral arteries. --Anterior inferior cerebellar arteries (AICA): Patent origins from the basilar artery. --Basilar artery: Normal. --Superior cerebellar arteries: Normal. --Posterior cerebral arteries: Normal. The right PCA is partially supplied by a posterior communicating artery (p-comm). ANTERIOR CIRCULATION: --Intracranial internal carotid arteries: Normal. --Anterior cerebral arteries (ACA): Normal. Both A1 segments are present. Patent anterior communicating artery (a-comm). --Middle cerebral arteries (MCA): Normal. VENOUS SINUSES: As permitted by contrast timing, patent. ANATOMIC VARIANTS: None Review of the MIP images confirms the above findings. IMPRESSION: 1. Motion degraded study. Within that limitation, no large vessel occlusion or hemodynamically significant stenosis of the head or neck. 2.  Aortic atherosclerosis (ICD10-I70.0). Electronically Signed   By: Ulyses Jarred M.D.   On: 09/18/2019 23:41   MR BRAIN WO CONTRAST  Result Date: 09/19/2019 CLINICAL DATA:  Altered mental status with unclear cause. EXAM: MRI HEAD WITHOUT CONTRAST TECHNIQUE: Multiplanar, multiecho pulse sequences of the brain and surrounding structures were obtained without intravenous contrast. COMPARISON:  CTA head neck from yesterday brain MRI report 10/17/2015, images not available FINDINGS: Brain: No acute infarction, hemorrhage, hydrocephalus, or shift. Moderate edema in the anterior left temporal lobe white matter which has been seen on multiple prior scans and correlates with a 2017 brain MRI report showing a meningioma in the left middle cranial fossa. A peritumoral cyst may have developed since prior. Vascular: Normal flow voids Skull and upper cervical spine: Unremarkable marrow signal. Sinuses/Orbits: Negative. Other: Motion degraded and truncated study. T1 and coronal T2 imaging was not acquired. IMPRESSION: 1. Chronic vasogenic edema in the left temporal lobe correlating with  a middle  cranial fossa meningioma reported in 2017. The previously seen enhancing mass is not visible on this motion degraded and noncontrast study which was truncated. 2. Background senescent changes. Electronically Signed   By: Monte Fantasia M.D.   On: 09/19/2019 07:22   DG Chest Port 1 View  Result Date: 09/19/2019 CLINICAL DATA:  Altered mental status, status post fall. EXAM: PORTABLE CHEST 1 VIEW COMPARISON:  Nov 01, 2018 FINDINGS: There is no evidence of acute infiltrate, pleural effusion or pneumothorax. The heart size and mediastinal contours are within normal limits. There is moderate severity calcification of the aortic arch. The visualized skeletal structures are unremarkable. IMPRESSION: No active disease. Electronically Signed   By: Virgina Norfolk M.D.   On: 09/19/2019 02:18   CT HEAD CODE STROKE WO CONTRAST  Result Date: 09/18/2019 CLINICAL DATA:  Code stroke.  Seizure and altered mental status EXAM: CT HEAD WITHOUT CONTRAST TECHNIQUE: Contiguous axial images were obtained from the base of the skull through the vertex without intravenous contrast. COMPARISON:  11/01/2018 FINDINGS: Brain: There is no mass, hemorrhage or extra-axial collection. The size and configuration of the ventricles and extra-axial CSF spaces are normal. There is encephalomalacia of the anterior left temporal lobe, unchanged. There is periventricular hypoattenuation compatible with chronic microvascular disease. Vascular: No abnormal hyperdensity of the major intracranial arteries or dural venous sinuses. No intracranial atherosclerosis. Skull: The visualized skull base, calvarium and extracranial soft tissues are normal. Sinuses/Orbits: No fluid levels or advanced mucosal thickening of the visualized paranasal sinuses. No mastoid or middle ear effusion. The orbits are normal. ASPECTS [facility] Stroke Program Early CT Score) - Ganglionic level infarction (caudate, lentiform nuclei, internal capsule, insula, M1-M3 cortex):  7 - Supraganglionic infarction (M4-M6 cortex): 3 Total score (0-10 with 10 being normal): 10 IMPRESSION: 1. No acute hemorrhage or mass lesion. 2. ASPECTS is 10. * These results were communicated to Dr. Roland Rack at 11:14 pm on 09/18/2019 by text page via the [facility] messaging system. Electronically Signed   By: Ulyses Jarred M.D.   On: 09/18/2019 23:15    Procedures Procedures  CRITICAL CARE Performed by: Delora Fuel Total critical care time: 85 minutes Critical care time was exclusive of separately billable procedures and treating other patients. Critical care was necessary to treat or prevent imminent or life-threatening deterioration. Critical care was time spent personally by me on the following activities: development of treatment plan with patient and/or surrogate as well as nursing, discussions with consultants, evaluation of patient's response to treatment, examination of patient, obtaining history from patient or surrogate, ordering and performing treatments and interventions, ordering and review of laboratory studies, ordering and review of radiographic studies, pulse oximetry and re-evaluation of patient's condition.  Medications Ordered in ED Medications - No data to display  ED Course  I have reviewed the triage vital signs and the nursing notes.  Pertinent labs & imaging results that were available during my care of the patient were reviewed by me and considered in my medical decision making (see chart for details).  Fall followed by focal seizure and patient now uncommunicative.  Possible stroke, possible postictal state, possible subclinical status epilepticus.  Because of stroke possibility, code stroke was activated and he is being sent for emergent CT of head.  Old records are reviewed, and he is noted to have a problem with gait instability, falls.  There was a prior hospitalization for altered mentation at which time he was also nonverbal and there was an underlying  pneumonia.  Given his  history, will also initiate sepsis work-up.  11:48 PM CT shows no acute process.  Apparently, family medicine.  Also seen him have a "blanking out" episode earlier today suggestive of another seizure.  ECG shows atrial fibrillation which is new, he will need to be anticoagulated.  It is possible that he is having cerebral emboli from his atrial fibrillation that may be triggering has seizures.  Will start on anticonvulsants as well as anticoagulation.   CT shows no acute process, CT angiogram shows no occlusions.  He is started on heparin for his atrial fibrillation.  Mental status has not improved on reexam.  Case is discussed with Dr. [name] Sax of [facility] hospitalist, who agrees to admit the patient. MDM Rules/Calculators/A&P This patients CHA2DS2-VASc Score and unadjusted Ischemic Stroke Rate (% per year) is equal to 4.8 % stroke rate/year from a score of 4  Above score calculated as 1 point each if present [CHF, HTN, DM, Vascular=MI/PAD/Aortic Plaque, Age if 65-74, or Male] Above score calculated as 2 points each if present [Age > 75, or Stroke/TIA/TE]   Final Clinical Impression(s) / ED Diagnoses Final diagnoses:  Seizures ([place])  Atrial fibrillation, unspecified type [facility])  Renal insufficiency    Rx / DC Orders ED Discharge Orders    None       Delora Fuel, MD [id number] [phone]

## 2019-09-19 ENCOUNTER — Inpatient Hospital Stay (HOSPITAL_COMMUNITY): Payer: Medicare HMO

## 2019-09-19 DIAGNOSIS — I083 Combined rheumatic disorders of mitral, aortic and tricuspid valves: Secondary | ICD-10-CM | POA: Diagnosis present

## 2019-09-19 DIAGNOSIS — N3000 Acute cystitis without hematuria: Secondary | ICD-10-CM | POA: Diagnosis not present

## 2019-09-19 DIAGNOSIS — A419 Sepsis, unspecified organism: Secondary | ICD-10-CM | POA: Diagnosis not present

## 2019-09-19 DIAGNOSIS — N179 Acute kidney failure, unspecified: Secondary | ICD-10-CM

## 2019-09-19 DIAGNOSIS — R279 Unspecified lack of coordination: Secondary | ICD-10-CM | POA: Diagnosis not present

## 2019-09-19 DIAGNOSIS — Z803 Family history of malignant neoplasm of breast: Secondary | ICD-10-CM | POA: Diagnosis not present

## 2019-09-19 DIAGNOSIS — Z800 Family history of malignant neoplasm of digestive organs: Secondary | ICD-10-CM | POA: Diagnosis not present

## 2019-09-19 DIAGNOSIS — I2510 Atherosclerotic heart disease of native coronary artery without angina pectoris: Secondary | ICD-10-CM | POA: Diagnosis present

## 2019-09-19 DIAGNOSIS — G9341 Metabolic encephalopathy: Secondary | ICD-10-CM | POA: Diagnosis not present

## 2019-09-19 DIAGNOSIS — N390 Urinary tract infection, site not specified: Secondary | ICD-10-CM

## 2019-09-19 DIAGNOSIS — E872 Acidosis: Secondary | ICD-10-CM | POA: Diagnosis not present

## 2019-09-19 DIAGNOSIS — R402411 Glasgow coma scale score 13-15, in the field [EMT or ambulance]: Secondary | ICD-10-CM | POA: Diagnosis not present

## 2019-09-19 DIAGNOSIS — D320 Benign neoplasm of cerebral meninges: Secondary | ICD-10-CM | POA: Diagnosis present

## 2019-09-19 DIAGNOSIS — Z96642 Presence of left artificial hip joint: Secondary | ICD-10-CM | POA: Diagnosis present

## 2019-09-19 DIAGNOSIS — Z743 Need for continuous supervision: Secondary | ICD-10-CM | POA: Diagnosis not present

## 2019-09-19 DIAGNOSIS — G936 Cerebral edema: Secondary | ICD-10-CM | POA: Diagnosis not present

## 2019-09-19 DIAGNOSIS — R569 Unspecified convulsions: Secondary | ICD-10-CM | POA: Diagnosis not present

## 2019-09-19 DIAGNOSIS — I480 Paroxysmal atrial fibrillation: Secondary | ICD-10-CM | POA: Diagnosis not present

## 2019-09-19 DIAGNOSIS — Z66 Do not resuscitate: Secondary | ICD-10-CM | POA: Diagnosis not present

## 2019-09-19 DIAGNOSIS — I482 Chronic atrial fibrillation, unspecified: Secondary | ICD-10-CM

## 2019-09-19 DIAGNOSIS — M6281 Muscle weakness (generalized): Secondary | ICD-10-CM | POA: Diagnosis not present

## 2019-09-19 DIAGNOSIS — I5032 Chronic diastolic (congestive) heart failure: Secondary | ICD-10-CM | POA: Diagnosis not present

## 2019-09-19 DIAGNOSIS — N400 Enlarged prostate without lower urinary tract symptoms: Secondary | ICD-10-CM | POA: Diagnosis present

## 2019-09-19 DIAGNOSIS — R2681 Unsteadiness on feet: Secondary | ICD-10-CM | POA: Diagnosis not present

## 2019-09-19 DIAGNOSIS — M150 Primary generalized (osteo)arthritis: Secondary | ICD-10-CM | POA: Diagnosis present

## 2019-09-19 DIAGNOSIS — I959 Hypotension, unspecified: Secondary | ICD-10-CM | POA: Diagnosis not present

## 2019-09-19 DIAGNOSIS — Z87891 Personal history of nicotine dependence: Secondary | ICD-10-CM | POA: Diagnosis not present

## 2019-09-19 DIAGNOSIS — I4891 Unspecified atrial fibrillation: Secondary | ICD-10-CM | POA: Diagnosis not present

## 2019-09-19 DIAGNOSIS — H9190 Unspecified hearing loss, unspecified ear: Secondary | ICD-10-CM | POA: Diagnosis present

## 2019-09-19 DIAGNOSIS — E860 Dehydration: Secondary | ICD-10-CM | POA: Diagnosis present

## 2019-09-19 DIAGNOSIS — R29898 Other symptoms and signs involving the musculoskeletal system: Secondary | ICD-10-CM | POA: Diagnosis not present

## 2019-09-19 DIAGNOSIS — A4159 Other Gram-negative sepsis: Secondary | ICD-10-CM | POA: Diagnosis not present

## 2019-09-19 DIAGNOSIS — Z9181 History of falling: Secondary | ICD-10-CM | POA: Diagnosis not present

## 2019-09-19 DIAGNOSIS — R140 Abdominal distension (gaseous): Secondary | ICD-10-CM | POA: Diagnosis not present

## 2019-09-19 DIAGNOSIS — Z20822 Contact with and (suspected) exposure to covid-19: Secondary | ICD-10-CM | POA: Diagnosis not present

## 2019-09-19 DIAGNOSIS — K2100 Gastro-esophageal reflux disease with esophagitis, without bleeding: Secondary | ICD-10-CM | POA: Diagnosis present

## 2019-09-19 DIAGNOSIS — I110 Hypertensive heart disease with heart failure: Secondary | ICD-10-CM | POA: Diagnosis present

## 2019-09-19 DIAGNOSIS — S299XXA Unspecified injury of thorax, initial encounter: Secondary | ICD-10-CM | POA: Diagnosis not present

## 2019-09-19 DIAGNOSIS — I2583 Coronary atherosclerosis due to lipid rich plaque: Secondary | ICD-10-CM | POA: Diagnosis not present

## 2019-09-19 DIAGNOSIS — E785 Hyperlipidemia, unspecified: Secondary | ICD-10-CM | POA: Diagnosis not present

## 2019-09-19 DIAGNOSIS — G40109 Localization-related (focal) (partial) symptomatic epilepsy and epileptic syndromes with simple partial seizures, not intractable, without status epilepticus: Secondary | ICD-10-CM | POA: Diagnosis not present

## 2019-09-19 DIAGNOSIS — R4182 Altered mental status, unspecified: Secondary | ICD-10-CM | POA: Diagnosis not present

## 2019-09-19 LAB — BASIC METABOLIC PANEL
Anion gap: 14 (ref 5–15)
BUN: 22 mg/dL (ref 8–23)
CO2: 22 mmol/L (ref 22–32)
Calcium: 9.3 mg/dL (ref 8.9–10.3)
Chloride: 99 mmol/L (ref 98–111)
Creatinine, Ser: 1.24 mg/dL (ref 0.61–1.24)
GFR calc Af Amer: 58 mL/min — ABNORMAL LOW (ref >60)
GFR calc non Af Amer: 50 mL/min — ABNORMAL LOW (ref >60)
Glucose, Bld: 133 mg/dL — ABNORMAL HIGH (ref 70–99)
Potassium: 5.2 mmol/L — ABNORMAL HIGH (ref 3.5–5.1)
Sodium: 135 mmol/L (ref 135–145)

## 2019-09-19 LAB — CBC
HCT: 43.8 % (ref 39.0–52.0)
Hemoglobin: 15.1 g/dL (ref 13.0–17.0)
MCH: 32.7 pg (ref 26.0–34.0)
MCHC: 34.5 g/dL (ref 30.0–36.0)
MCV: 94.8 fL (ref 80.0–100.0)
Platelets: 248 10*3/uL (ref 150–400)
RBC: 4.62 MIL/uL (ref 4.22–5.81)
RDW: 12.1 % (ref 11.5–15.5)
WBC: 25.8 10*3/uL — ABNORMAL HIGH (ref 4.0–10.5)
nRBC: 0 % (ref 0.0–0.2)

## 2019-09-19 LAB — COMPREHENSIVE METABOLIC PANEL
ALT: 27 U/L (ref 0–44)
AST: 39 U/L (ref 15–41)
Albumin: 3.8 g/dL (ref 3.5–5.0)
Alkaline Phosphatase: 71 U/L (ref 38–126)
Anion gap: 14 (ref 5–15)
BUN: 22 mg/dL (ref 8–23)
CO2: 20 mmol/L — ABNORMAL LOW (ref 22–32)
Calcium: 9.2 mg/dL (ref 8.9–10.3)
Chloride: 101 mmol/L (ref 98–111)
Creatinine, Ser: 1.47 mg/dL — ABNORMAL HIGH (ref 0.61–1.24)
GFR calc Af Amer: 47 mL/min — ABNORMAL LOW (ref >60)
GFR calc non Af Amer: 41 mL/min — ABNORMAL LOW (ref >60)
Glucose, Bld: 174 mg/dL — ABNORMAL HIGH (ref 70–99)
Potassium: 4.2 mmol/L (ref 3.5–5.1)
Sodium: 135 mmol/L (ref 135–145)
Total Bilirubin: 0.8 mg/dL (ref 0.3–1.2)
Total Protein: 6.3 g/dL — ABNORMAL LOW (ref 6.5–8.1)

## 2019-09-19 LAB — RAPID URINE DRUG SCREEN, HOSP PERFORMED
Amphetamines: NOT DETECTED
Barbiturates: NOT DETECTED
Benzodiazepines: NOT DETECTED
Cocaine: NOT DETECTED
Opiates: NOT DETECTED
Tetrahydrocannabinol: NOT DETECTED

## 2019-09-19 LAB — ECHOCARDIOGRAM COMPLETE
Height: 67 in
Weight: 2694.9 oz

## 2019-09-19 LAB — URINALYSIS, ROUTINE W REFLEX MICROSCOPIC
Bilirubin Urine: NEGATIVE
Glucose, UA: NEGATIVE mg/dL
Hgb urine dipstick: NEGATIVE
Ketones, ur: NEGATIVE mg/dL
Nitrite: NEGATIVE
Protein, ur: 30 mg/dL — AB
Specific Gravity, Urine: >1.046 — ABNORMAL HIGH (ref 1.005–1.030)
WBC, UA: >50 WBC/hpf — ABNORMAL HIGH (ref 0–5)
pH: 5 (ref 5.0–8.0)

## 2019-09-19 LAB — LACTIC ACID, PLASMA
Lactic Acid, Venous: 3.7 mmol/L (ref 0.5–1.9)
Lactic Acid, Venous: 1.2 mmol/L (ref 0.5–1.9)
Lactic Acid, Venous: 1.1 mmol/L (ref 0.5–1.9)

## 2019-09-19 LAB — SARS CORONAVIRUS 2 (TAT 6-24 HRS): SARS Coronavirus 2: NEGATIVE

## 2019-09-19 LAB — HEPARIN LEVEL (UNFRACTIONATED): Heparin Unfractionated: 0.55 IU/mL (ref 0.30–0.70)

## 2019-09-19 LAB — PROCALCITONIN: Procalcitonin: 0.74 ng/mL

## 2019-09-19 MED ORDER — LORAZEPAM 2 MG/ML IJ SOLN: 1.0000 mg | INTRAMUSCULAR | Status: DC | PRN

## 2019-09-19 MED ORDER — LEVETIRACETAM 500 MG PO TABS
500.0000 mg | ORAL_TABLET | Freq: Two times a day (BID) | ORAL | Status: DC
  Administered 2019-09-19 – 2019-09-23 (×8): 500 mg via ORAL
  Filled 2019-09-19 (×9): qty 1

## 2019-09-19 MED ORDER — SODIUM CHLORIDE 0.9 % IV SOLN: INTRAVENOUS | Status: AC

## 2019-09-19 MED ORDER — HEPARIN (PORCINE) 25000 UT/250ML-% IV SOLN
1100.0000 [IU]/h | INTRAVENOUS | Status: DC
  Administered 2019-09-19: 1100 [IU]/h via INTRAVENOUS
  Filled 2019-09-19: qty 250

## 2019-09-19 MED ORDER — SODIUM CHLORIDE 0.9 % IV SOLN
1.0000 g | INTRAVENOUS | Status: DC
  Administered 2019-09-20 – 2019-09-22 (×4): 1 g via INTRAVENOUS
  Filled 2019-09-19: qty 1
  Filled 2019-09-19 (×3): qty 10
  Filled 2019-09-19: qty 1
  Filled 2019-09-19: qty 10

## 2019-09-19 MED ORDER — ACETAMINOPHEN 650 MG RE SUPP: 650.0000 mg | RECTAL | Status: DC | PRN

## 2019-09-19 MED ORDER — SODIUM CHLORIDE 0.9 % IV SOLN
1.0000 g | Freq: Once | INTRAVENOUS | Status: AC
  Administered 2019-09-19: 1 g via INTRAVENOUS
  Filled 2019-09-19: qty 10

## 2019-09-19 MED ORDER — ENOXAPARIN SODIUM 40 MG/0.4ML ~~LOC~~ SOLN
40.0000 mg | SUBCUTANEOUS | Status: DC
  Administered 2019-09-19 – 2019-09-22 (×4): 40 mg via SUBCUTANEOUS
  Filled 2019-09-19 (×4): qty 0.4

## 2019-09-19 MED ORDER — ACETAMINOPHEN 325 MG PO TABS
650.0000 mg | ORAL_TABLET | ORAL | Status: DC | PRN
  Administered 2019-09-23: 650 mg via ORAL
  Filled 2019-09-19: qty 2

## 2019-09-19 MED ORDER — SODIUM CHLORIDE 0.9 % IV SOLN
75.0000 mL/h | INTRAVENOUS | Status: DC
  Administered 2019-09-19: 75 mL/h via INTRAVENOUS

## 2019-09-19 NOTE — Progress Notes (Signed)
ANTICOAGULATION CONSULT NOTE - Initial Consult  Pharmacy Consult for heparin Indication: atrial fibrillation  No Known Allergies  Patient Measurements: Weight: 78.9 kg (174 lb)  Vital Signs: BP: 128/45 (04/04 2350) Pulse Rate: 105 (04/04 2350)  Labs: Recent Labs    09/18/19 2256 09/18/19 2305  HGB 14.6 14.3  HCT 43.2 42.0  PLT 268  --   APTT 31  --   LABPROT 14.5  --   INR 1.1  --   CREATININE  --  1.50*    Estimated Creatinine Clearance: 29.4 mL/min (A) (by C-G formula based on SCr of 1.5 mg/dL (H)).   Medical History: Past Medical History:  Diagnosis Date  . Allergic rhinitis   . Amnesia   . Atherosclerotic heart disease of native coronary artery without angina pectoris   . BPH (benign prostatic hyperplasia)   . Bradycardia   . Chronic diastolic (congestive) heart failure ([place])   . Colon polyps   . Coronary artery disease    MODERATE  . Dizziness   . Edema of lower extremity   . Erectile dysfunction   . H. pylori infection    Hx of   . Hard of hearing   . Headache(784.0)   . History of colon polyps 2009   Dr. Orr/Colonoscopy -small cecal adenoma  . History of falling   . Hx of colonoscopy 2009   Dr Lajoyce Corners, hemorrhoids & polyps  . Hyperlipidemia   . Hypertension   . Hypertensive heart disease with heart failure ([place])   . Hypokalemia   . Internal hemorrhoids 2009   Dr. Lajoyce Corners Colonoscopy  . Metabolic encephalopathy   . Mitral valve regurgitation   . Muscle weakness (generalized)   . Pneumonia   . Primary generalized (osteo)arthritis   . Reflux esophagitis   . Sepsis ([place]) 11/04/2018  . Unspecified hemorrhoids   . Unsteadiness on feet   . Vitamin D deficiency     Assessment: 86yo male initially called as code stroke after a fall but exam c/w seizure, found to be in Afib, to begin heparin.  Goal of Therapy:  Heparin level 0.3-0.7 units/ml Monitor platelets by anticoagulation protocol: Yes   Plan:  Will hold off on heparin bolus given fall and  start heparin gtt at 1100 units/hr and monitor heparin levels and CBC.  Wynona Neat, PharmD, BCPS  09/19/2019,12:18 AM

## 2019-09-19 NOTE — Progress Notes (Signed)
ANTICOAGULATION CONSULT NOTE - Follow Up Consult  Pharmacy Consult for Heparin Indication: atrial fibrillation  No Known Allergies  Patient Measurements: Height: 5\' 7"  (170.2 cm) Weight: 76.4 kg (168 lb 6.9 oz) IBW/kg (Calculated) : 66.1 Heparin Dosing Weight: 76.4 kg  Vital Signs: Temp: 97.7 F (36.5 C) (04/05 0815) Temp Source: Oral (04/05 0815) BP: 121/77 (04/05 0815) Pulse Rate: 79 (04/05 0815)  Labs: Recent Labs    09/18/19 2256 09/18/19 2256 09/18/19 2305 09/19/19 0319 09/19/19 0854  HGB 14.6   < > 14.3 15.1  --   HCT 43.2  --  42.0 43.8  --   PLT 268  --   --  248  --   APTT 31  --   --   --   --   LABPROT 14.5  --   --   --   --   INR 1.1  --   --   --   --   HEPARINUNFRC  --   --   --   --  0.55  CREATININE 1.47*  --  1.50* 1.24  --    < > = values in this interval not displayed.    Estimated Creatinine Clearance: 35.5 mL/min (by C-G formula based on SCr of 1.24 mg/dL).  Assessment:  86yo male initially called as code stroke after a fall but exam c/w seizure, found to be in Afib, IV heparin begun.    Initial heparin level is therapeutic (0.55) on 1100 units/hr.  Goal of Therapy:  Heparin level 0.3-0.7 units/ml Monitor platelets by anticoagulation protocol: Yes   Plan:   Continue heparin drip at 1100 units/hr  Daily heparin level and CBC.  Follow up anticoagulation plans.  Arty Baumgartner, [place] Phone: [phone] 09/19/2019,10:04 AM

## 2019-09-19 NOTE — Evaluation (Signed)
Physical Therapy Evaluation Patient Details Name: [name] [name] MRN: [id number] DOB: 08-01-1924 Today's Date: 09/19/2019   History of Present Illness  86 yo male admitted 4/4 for AMS, fall at ALF. Initial workup for CVA negative, workup now for seizures. PMH includes amnesia, CAD, chronic diastolic congestive heart failure, hypertension, hyperlipidemia, BPH, history of falls, meningioma.  Clinical Impression   Pt presents with functional LE weakness, difficulty performing mobility tasks, impaired cognition not at baseline chart review, inability to progress to upright standing, and decreased activity tolreance. Pt to benefit from acute PT to address deficits. Pt required mod-max assist for mobility this session, and unable to progress to upright standing and gait due to pt's considerable weakness. Session limited by pt's extreme HOH, PT had to write out short cues for pt for duration of session and pt with difficulty with this without word-by-word cuing from PT. PT recommending SNF level of care post-acutely to address mobility deficits and return to PLOF. PT to progress mobility as tolerated, and will continue to follow acutely.     Follow Up Recommendations SNF;Supervision/Assistance - 24 hour    Equipment Recommendations  Other (comment)(defer to next venue)    Recommendations for Other Services       Precautions / Restrictions Precautions Precautions: Fall Restrictions Weight Bearing Restrictions: No      Mobility  Bed Mobility Overal bed mobility: Needs Assistance Bed Mobility: Rolling;Sidelying to Sit;Sit to Supine Rolling: Mod assist Sidelying to sit: Mod assist;HOB elevated   Sit to supine: Min assist;HOB elevated   General bed mobility comments: Mod assist for rolling to R side for truncal translation and hand placement on bedrails, mod assist for sidelying to sit for trunk elevation and LE management. Pt able to scoot self to EOB with increased time and effort. Min  assist for LE lifting into bed, scooting up in bed with use of bed pads.  Transfers Overall transfer level: Needs assistance Equipment used: Rolling walker (2 wheeled) Transfers: Sit to/from Stand Sit to Stand: Max assist;From elevated surface         General transfer comment: Stand attempt x3 with max assist, able to clear hips but unable to come to upright standing even with gluteal and truncal assist by PT. Pt also states during transfer "you don't need to help me", lacking awareness of significant weakness.  Ambulation/Gait             General Gait Details: unable this day  Stairs            Wheelchair Mobility    Modified Rankin (Stroke Patients Only)       Balance Overall balance assessment: Needs assistance;History of Falls Sitting-balance support: Bilateral upper extremity supported;Feet supported Sitting balance-Leahy Scale: Fair     Standing balance support: Bilateral upper extremity supported Standing balance-Leahy Scale: Zero Standing balance comment: unable to stand upright even with max PT assist                             Pertinent Vitals/Pain Pain Assessment: Faces Faces Pain Scale: Hurts little more Pain Location: low back Pain Descriptors / Indicators: Sore;Discomfort Pain Intervention(s): Limited activity within patient's tolerance;Monitored during session;Repositioned    Home Living Family/patient expects to be discharged to:: Assisted living               Home Equipment: Walker - 4 wheels Additional Comments: Pt states "yes" when asked if he lives at [facility], states it  is ALF but "I don't require any assistance" (unsure of accuacy, pt is a poor historian).    Prior Function Level of Independence: Needs assistance   Gait / Transfers Assistance Needed: Pt reports "I use my rolling chair (assuming rollator) to walk around, I get up by myself"  ADL's / Homemaking Assistance Needed: Unsure, pt states he required  no assist with dressing and bathing PTA        Hand Dominance   Dominant Hand: Right    Extremity/Trunk Assessment   Upper Extremity Assessment Upper Extremity Assessment: Defer to OT evaluation    Lower Extremity Assessment Lower Extremity Assessment: Generalized weakness(Unable to formally assess MMT due to HOH/cognition; full AROM knee extension, 1/2 ROM hip flexion EOB)    Cervical / Trunk Assessment Cervical / Trunk Assessment: Normal  Communication   Communication: HOH(EXTREMELY - had to write out questions and gesture to communicate with pt)  Cognition Arousal/Alertness: Awake/alert Behavior During Therapy: Restless Overall Cognitive Status: No family/caregiver present to determine baseline cognitive functioning Area of Impairment: Orientation;Attention;Memory;Following commands;Safety/judgement;Problem solving                 Orientation Level: Disoriented to;Place;Time;Situation Current Attention Level: Sustained Memory: Decreased short-term memory Following Commands: Follows one step commands inconsistently Safety/Judgement: Decreased awareness of safety;Decreased awareness of deficits   Problem Solving: Slow processing;Decreased initiation;Difficulty sequencing;Requires tactile cues;Requires verbal cues General Comments: waxing and waning orientation during session, initially states "I am at [facility]" but then states "will you take me back to my room so I can do my finances? I am in room 7". Pt reports that he can stand and walk on his own, but is very heavy physical assist at present. Difficult to determine true cognition due to [facility], PT wrote out subjective questions and PT had to physically cue each word one by one for pt to understand.      General Comments General comments (skin integrity, edema, etc.): HR 78-109 bpm, in afib rhythm    Exercises     Assessment/Plan    PT Assessment Patient needs continued PT services  PT Problem List Decreased  strength;Decreased mobility;Decreased safety awareness;Decreased balance;Cardiopulmonary status limiting activity;Decreased knowledge of use of DME;Pain;Decreased cognition;Decreased activity tolerance       PT Treatment Interventions DME instruction;Therapeutic activities;Gait training;Patient/family education;Therapeutic exercise;Balance training;Functional mobility training;Neuromuscular re-education    PT Goals (Current goals can be found in the Care Plan section)  Acute Rehab PT Goals Patient Stated Goal: walk, do my exercises PT Goal Formulation: With patient Time For Goal Achievement: 10/03/19 Potential to Achieve Goals: Good    Frequency Min 2X/week   Barriers to discharge        Co-evaluation               AM-PAC PT "6 Clicks" Mobility  Outcome Measure Help needed turning from your back to your side while in a flat bed without using bedrails?: A Lot Help needed moving from lying on your back to sitting on the side of a flat bed without using bedrails?: A Lot Help needed moving to and from a bed to a chair (including a wheelchair)?: Total Help needed standing up from a chair using your arms (e.g., wheelchair or bedside chair)?: Total Help needed to walk in hospital room?: Total Help needed climbing 3-5 steps with a railing? : Total 6 Click Score: 8    End of Session Equipment Utilized During Treatment: Gait belt Activity Tolerance: Patient limited by fatigue;Patient tolerated treatment well Patient left:  in bed;with call bell/phone within reach;with bed alarm set Nurse Communication: Mobility status PT Visit Diagnosis: Other abnormalities of gait and mobility (R26.89);Muscle weakness (generalized) (M62.81);History of falling (Z91.81)    Time: [id number] PT Time Calculation (min) (ACUTE ONLY): 28 min   Charges:   PT Evaluation $PT Eval Low Complexity: 1 Low PT Treatments $Therapeutic Activity: 8-22 mins        Aranda Bihm E, PT Acute Rehabilitation  Services Pager [phone]  Office [phone]   Maurisio Ruddy D Elonda Husky 09/19/2019, 11:34 AM

## 2019-09-19 NOTE — Procedures (Signed)
Patient Name: BENCE MI  MRN: [id number]  Epilepsy Attending: Lora Havens  Referring Physician/Provider: Dr. Shela Leff Date: 09/19/2019 Duration: 22.11 minutes  Patient history: 86 year old male with history of left temporal meningioma presented with new onset seizures.  EEG evaluate for seizures.  Level of alertness: Awake  AEDs during EEG study: Keppra  Technical aspects: This EEG study was done with scalp electrodes positioned according to the 10-20 International system of electrode placement. Electrical activity was acquired at a sampling rate of 500Hz  and reviewed with a high frequency filter of 70Hz  and a low frequency filter of 1Hz . EEG data were recorded continuously and digitally stored.   Description: During awake state, no clear posterior dominant was seen.  EEG showed intermittent generalized 3 to 5 Hz theta-delta slowing as well as generalized 9 to 10 Hz polymorphic alpha activity. Hyperventilation and photic stimulation were not performed.  Abnormality -Intermittent slow, generalized  IMPRESSION: This study is suggestive of mild diffuse encephalopathy, nonspecific etiology No seizures or epileptiform discharges were seen throughout the recording.  Kyleigh Nannini Barbra Sarks

## 2019-09-19 NOTE — Progress Notes (Signed)
PROGRESS NOTE  [name] [name] [id number] DOB: [birth year] DOA: 09/18/2019 PCP: [name], MD  HPI/Recap of past 24 hours: HPI from Dr Zelphia Cairo is a 86 y.o. male with medical history significant of amnesia, CAD, chronic diastolic congestive heart failure, hypertension, hyperlipidemia, BPH, history of falls, meningioma presenting to the ED from his nursing home to be evaluated after an unwitnessed fall. Patient was found down at his nursing home after having fallen possibly from a chair.  He was confused after the fall. Soon after he was placed on a bed, had an episode of convulsive activity involving the right side only which lasted approximately 30 seconds.  Following this the patient was somnolent and unarousable.  At baseline patient is AAO x4.  EMS was called and code stroke activated upon arrival to the ED. In the ED, head CT negative for acute intracranial abnormality.  CTA head and neck negative for LVO.  Neurology was consulted for work-up of seizures.  Patient was given a Keppra load. Patient was also found to be in new onset A. fib, rate controlled.  Heparin was started for anticoagulation. Labs showed WBC count 27.7 with left shift, Creatinine 1.4, baseline 0.9. UA with large amount of leukocytes, greater than 50 WBCs, and many bacteria.  Urine culture pending.  SARS-CoV-2 PCR negative. Blood culture x2 pending.  Chest x-ray showing no active disease.  Patient admitted for further management.     Today, patient still noted to be confused, continues to asked to be placed in his room, unable to answer any of my questions, slightly restless.     Assessment/Plan: Principal Problem:   Seizure ([place]) Active Problems:   Acute metabolic encephalopathy   New onset atrial fibrillation ([facility])   UTI (urinary tract infection)   AKI (acute kidney injury) ([place])   Possible partial seizure None witnessed so far here in the hospital, no prior history as per  daughter Head CT negative for any acute intracranial abnormality CTA head and neck negative for LVO MRI brain showed chronic vasogenic edema in the left temporal lobe correlating with a middle cranial fossa meningioma EEG suggestive of mild diffuse encephalopathy.  No seizures or epileptiform discharges were seen Neurology on board, continue Keppra, Ativan as needed Seizure precautions  Sepsis likely 2/2 UTI Currently afebrile, with leukocytosis, tachycardia, lactic acidosis LA 3.7 will trend Procalcitonin pending UA positive for infection, UC pending BC x2 pending Continue ceftriaxone, pending sensitivities  New onset A. Fib Currently rate controlled CHA2DS2-VASc, around 5 Echo pending Not a candidate for anticoagulation, as patient is elderly, with increased risk of falls/seizures.  Discussed extensively with daughter Xzaiver Benites on 09/19/2019 who understands his increased risk of bleeding versus CVA from A. Fib.  Daughter is in agreement not to start any anticoagulation Discontinue IV heparin Telemetry  AKI/acute urinary retention Likely 2/2 poor oral intake/dehydration UA with increased specific gravity Continue IV fluids In the ED, bladder scan showed 450 mils, in and out cath done As needed bladder scan/In-N-Out cath Daily BMP  Acute metabolic encephalopathy Likely multifactorial, possible recent seizure/post ictal state, possible UTI/sepsis, dehydration, possible underlying dementia Management as above  Chronic diastolic CHF No signs of volume overload, appears dry Repeat echo pending  Hypertension BP stable  History of frequent falls Likely multifactorial Fall precautions PT/OT          Malnutrition Type:      Malnutrition Characteristics:      Nutrition Interventions:       Estimated  body mass index is 26.38 kg/m as calculated from the following:   Height as of this encounter: 5\' 7"  (1.702 m).   Weight as of this encounter: 76.4 kg.      Code Status: DNR  Family Communication: Discussed with daughter on 09/19/2019  Disposition Plan: Likely back to nursing home   Consultants:  None  Procedures:  None  Antimicrobials:  Ceftriaxone  DVT prophylaxis: Lovenox   Objective: Vitals:   09/19/19 0300 09/19/19 0423 09/19/19 0815 09/19/19 1259  BP: 137/72 (!) 128/50 121/77 (!) 158/52  Pulse: (!) 112  79 63  Resp: (!) 24 17 (!) 21 20  Temp:  98 F (36.7 C) 97.7 F (36.5 C) 98.1 F (36.7 C)  TempSrc:  Oral Oral Tympanic  SpO2: 94% 96% 96% 98%  Weight:  76.4 kg    Height:  5\' 7"  (1.702 m)      Intake/Output Summary (Last 24 hours) at 09/19/2019 1412 Last data filed at 09/19/2019 0353 Gross per 24 hour  Intake --  Output 300 ml  Net -300 ml   Filed Weights   09/18/19 2351 09/19/19 0423  Weight: 78.9 kg 76.4 kg    Exam:  General: NAD, confused, restless, not following commands  Cardiovascular: S1, S2 present  Respiratory: CTAB  Abdomen: Soft, nontender, mildly distended, bowel sounds present  Musculoskeletal: No bilateral pedal edema noted  Skin: Normal  Psychiatry: Unable to assess   Data Reviewed: CBC: Recent Labs  Lab 09/18/19 2256 09/18/19 2305 09/19/19 0319  WBC 27.7*  --  25.8*  NEUTROABS 24.2*  --   --   HGB 14.6 14.3 15.1  HCT 43.2 42.0 43.8  MCV 97.3  --  94.8  PLT 268  --  [id number]   Basic Metabolic Panel: Recent Labs  Lab 09/18/19 2256 09/18/19 2305 09/19/19 0319  NA 135 133* 135  K 4.2 4.0 5.2*  CL 101 99 99  CO2 20*  --  22  GLUCOSE 174* 164* 133*  BUN 22 24* 22  CREATININE 1.47* 1.50* 1.24  CALCIUM 9.2  --  9.3   GFR: Estimated Creatinine Clearance: 35.5 mL/min (by C-G formula based on SCr of 1.24 mg/dL). Liver Function Tests: Recent Labs  Lab 09/18/19 2256  AST 39  ALT 27  ALKPHOS 71  BILITOT 0.8  PROT 6.3*  ALBUMIN 3.8   No results for input(s): LIPASE, AMYLASE in the last 168 hours. No results for input(s): AMMONIA in the last 168  hours. Coagulation Profile: Recent Labs  Lab 09/18/19 2256  INR 1.1   Cardiac Enzymes: No results for input(s): CKTOTAL, CKMB, CKMBINDEX, TROPONINI in the last 168 hours. BNP (last 3 results) No results for input(s): PROBNP in the last 8760 hours. HbA1C: No results for input(s): HGBA1C in the last 72 hours. CBG: No results for input(s): GLUCAP in the last 168 hours. Lipid Profile: No results for input(s): CHOL, HDL, LDLCALC, TRIG, CHOLHDL, LDLDIRECT in the last 72 hours. Thyroid Function Tests: No results for input(s): TSH, T4TOTAL, FREET4, T3FREE, THYROIDAB in the last 72 hours. Anemia Panel: No results for input(s): VITAMINB12, FOLATE, FERRITIN, TIBC, IRON, RETICCTPCT in the last 72 hours. Urine analysis:    Component Value Date/Time   COLORURINE YELLOW 09/19/2019 0148   APPEARANCEUR CLOUDY (A) 09/19/2019 0148   LABSPEC >1.046 (H) 09/19/2019 0148   PHURINE 5.0 09/19/2019 0148   GLUCOSEU NEGATIVE 09/19/2019 0148   HGBUR NEGATIVE 09/19/2019 0148   BILIRUBINUR NEGATIVE 09/19/2019 0148   KETONESUR NEGATIVE 09/19/2019 0148  PROTEINUR 30 (A) 09/19/2019 0148   UROBILINOGEN 0.2 08/17/2013 0105   NITRITE NEGATIVE 09/19/2019 0148   LEUKOCYTESUR LARGE (A) 09/19/2019 0148   Sepsis Labs: @LABRCNTIP (procalcitonin:4,lacticidven:4)  ) Recent Results (from the past 240 hour(s))  SARS CORONAVIRUS 2 (TAT 6-24 HRS) Nasopharyngeal Nasopharyngeal Swab     Status: None   Collection Time: 09/19/19  1:48 AM   Specimen: Nasopharyngeal Swab  Result Value Ref Range Status   SARS Coronavirus 2 NEGATIVE NEGATIVE Final    Comment: (NOTE) SARS-CoV-2 target nucleic acids are NOT DETECTED. The SARS-CoV-2 RNA is generally detectable in upper and lower respiratory specimens during the acute phase of infection. Negative results do not preclude SARS-CoV-2 infection, do not rule out co-infections with other pathogens, and should not be used as the sole basis for treatment or other patient management  decisions. Negative results must be combined with clinical observations, patient history, and epidemiological information. The expected result is Negative. Fact Sheet for Patients: SugarRoll.be Fact Sheet for Healthcare Providers: https://www.woods-mathews.com/ This test is not yet approved or cleared by the [place] FDA and  has been authorized for detection and/or diagnosis of SARS-CoV-2 by FDA under an Emergency Use Authorization (EUA). This EUA will remain  in effect (meaning this test can be used) for the duration of the COVID-19 declaration under Section 56 4(b)(1) of the Act, 21 U.S.C. section 360bbb-3(b)(1), unless the authorization is terminated or revoked sooner. Performed at [facility], [place] [address] [place], [place] [address]       Studies: EEG  Result Date: 09/19/2019 Lora Havens, MD     09/19/2019 12:28 PM Patient Name: TRUNG ARDITO MRN: [id number] Epilepsy Attending: Lora Havens Referring Physician/Provider: Dr. Shela Leff Date: 09/19/2019 Duration: 22.11 minutes Patient history: 86 year old male with history of left temporal meningioma presented with new onset seizures.  EEG evaluate for seizures. Level of alertness: Awake AEDs during EEG study: Keppra Technical aspects: This EEG study was done with scalp electrodes positioned according to the 10-20 International system of electrode placement. Electrical activity was acquired at a sampling rate of 500Hz  and reviewed with a high frequency filter of 70Hz  and a low frequency filter of 1Hz . EEG data were recorded continuously and digitally stored. Description: During awake state, no clear posterior dominant was seen.  EEG showed intermittent generalized 3 to 5 Hz theta-delta slowing as well as generalized 9 to 10 Hz polymorphic alpha activity. Hyperventilation and photic stimulation were not performed. Abnormality -Intermittent slow, generalized IMPRESSION:  This study is suggestive of mild diffuse encephalopathy, nonspecific etiology No seizures or epileptiform discharges were seen throughout the recording. Lora Havens   CT Code Stroke CTA Head W/WO contrast  Result Date: 09/18/2019 CLINICAL DATA:  Seizure and altered mental status EXAM: CT ANGIOGRAPHY HEAD AND NECK TECHNIQUE: Multidetector CT imaging of the head and neck was performed using the standard protocol during bolus administration of intravenous contrast. Multiplanar CT image reconstructions and MIPs were obtained to evaluate the vascular anatomy. Carotid stenosis measurements (when applicable) are obtained utilizing NASCET criteria, using the distal internal carotid diameter as the denominator. CONTRAST:  29mL OMNIPAQUE IOHEXOL 350 MG/ML SOLN COMPARISON:  Head CT 09/18/2019 FINDINGS: The examination is markedly degraded by motion. CTA NECK FINDINGS SKELETON: There is no bony spinal canal stenosis. No lytic or blastic lesion. OTHER NECK: Normal pharynx, larynx and major salivary glands. No cervical lymphadenopathy. Unremarkable thyroid gland. UPPER CHEST: No pneumothorax or pleural effusion. No nodules or masses. AORTIC ARCH: There is mild  calcific atherosclerosis of the aortic arch. There is no aneurysm, dissection or hemodynamically significant stenosis of the visualized portion of the aorta. Conventional 3 vessel aortic branching pattern. The visualized proximal subclavian arteries are widely patent. RIGHT CAROTID SYSTEM: No dissection, occlusion or aneurysm. Mild atherosclerotic calcification at the carotid bifurcation without hemodynamically significant stenosis. LEFT CAROTID SYSTEM: No dissection, occlusion or aneurysm. Mild atherosclerotic calcification at the carotid bifurcation without hemodynamically significant stenosis. VERTEBRAL ARTERIES: Left dominant configuration. Both origins are clearly patent. There is no dissection, occlusion or flow-limiting stenosis to the skull base (V1-V3  segments). CTA HEAD FINDINGS POSTERIOR CIRCULATION: --Vertebral arteries: Normal V4 segments. --Posterior inferior cerebellar arteries (PICA): Patent origins from the vertebral arteries. --Anterior inferior cerebellar arteries (AICA): Patent origins from the basilar artery. --Basilar artery: Normal. --Superior cerebellar arteries: Normal. --Posterior cerebral arteries: Normal. The right PCA is partially supplied by a posterior communicating artery (p-comm). ANTERIOR CIRCULATION: --Intracranial internal carotid arteries: Normal. --Anterior cerebral arteries (ACA): Normal. Both A1 segments are present. Patent anterior communicating artery (a-comm). --Middle cerebral arteries (MCA): Normal. VENOUS SINUSES: As permitted by contrast timing, patent. ANATOMIC VARIANTS: None Review of the MIP images confirms the above findings. IMPRESSION: 1. Motion degraded study. Within that limitation, no large vessel occlusion or hemodynamically significant stenosis of the head or neck. 2.  Aortic atherosclerosis (ICD10-I70.0). Electronically Signed   By: Ulyses Jarred M.D.   On: 09/18/2019 23:41   DG Abd 1 View  Result Date: 09/19/2019 CLINICAL DATA:  MRI. Question metal. EXAM: ABDOMEN - 1 VIEW COMPARISON:  None. FINDINGS: Stomach is moderately distended. Bowel gas pattern is otherwise normal. Lung bases are clear. The heart is enlarged. Left hip hemiarthroplasty is present. No other metallic implants are present. Contrast is present within the urinary bladder. IMPRESSION: 1. Left hip hemiarthroplasty. 2. No other metallic implants. 3. Moderate gaseous distention of the stomach. 4. Cardiomegaly without failure. Electronically Signed   By: San Morelle M.D.   On: 09/19/2019 07:50   MR BRAIN WO CONTRAST  Result Date: 09/19/2019 CLINICAL DATA:  Altered mental status with unclear cause. EXAM: MRI HEAD WITHOUT CONTRAST TECHNIQUE: Multiplanar, multiecho pulse sequences of the brain and surrounding structures were obtained  without intravenous contrast. COMPARISON:  CTA head neck from yesterday brain MRI report 10/17/2015, images not available FINDINGS: Brain: No acute infarction, hemorrhage, hydrocephalus, or shift. Moderate edema in the anterior left temporal lobe white matter which has been seen on multiple prior scans and correlates with a 2017 brain MRI report showing a meningioma in the left middle cranial fossa. A peritumoral cyst may have developed since prior. Vascular: Normal flow voids Skull and upper cervical spine: Unremarkable marrow signal. Sinuses/Orbits: Negative. Other: Motion degraded and truncated study. T1 and coronal T2 imaging was not acquired. IMPRESSION: 1. Chronic vasogenic edema in the left temporal lobe correlating with a middle cranial fossa meningioma reported in 2017. The previously seen enhancing mass is not visible on this motion degraded and noncontrast study which was truncated. 2. Background senescent changes. Electronically Signed   By: Monte Fantasia M.D.   On: 09/19/2019 07:22   DG Chest Port 1 View  Result Date: 09/19/2019 CLINICAL DATA:  Altered mental status, status post fall. EXAM: PORTABLE CHEST 1 VIEW COMPARISON:  Nov 01, 2018 FINDINGS: There is no evidence of acute infiltrate, pleural effusion or pneumothorax. The heart size and mediastinal contours are within normal limits. There is moderate severity calcification of the aortic arch. The visualized skeletal structures are unremarkable. IMPRESSION: No active disease. Electronically  Signed   By: Virgina Norfolk M.D.   On: 09/19/2019 02:18   CT HEAD CODE STROKE WO CONTRAST  Result Date: 09/18/2019 CLINICAL DATA:  Code stroke.  Seizure and altered mental status EXAM: CT HEAD WITHOUT CONTRAST TECHNIQUE: Contiguous axial images were obtained from the base of the skull through the vertex without intravenous contrast. COMPARISON:  11/01/2018 FINDINGS: Brain: There is no mass, hemorrhage or extra-axial collection. The size and  configuration of the ventricles and extra-axial CSF spaces are normal. There is encephalomalacia of the anterior left temporal lobe, unchanged. There is periventricular hypoattenuation compatible with chronic microvascular disease. Vascular: No abnormal hyperdensity of the major intracranial arteries or dural venous sinuses. No intracranial atherosclerosis. Skull: The visualized skull base, calvarium and extracranial soft tissues are normal. Sinuses/Orbits: No fluid levels or advanced mucosal thickening of the visualized paranasal sinuses. No mastoid or middle ear effusion. The orbits are normal. ASPECTS [facility] Stroke Program Early CT Score) - Ganglionic level infarction (caudate, lentiform nuclei, internal capsule, insula, M1-M3 cortex): 7 - Supraganglionic infarction (M4-M6 cortex): 3 Total score (0-10 with 10 being normal): 10 IMPRESSION: 1. No acute hemorrhage or mass lesion. 2. ASPECTS is 10. * These results were communicated to Dr. Roland Rack at 11:14 pm on 09/18/2019 by text page via the [facility] messaging system. Electronically Signed   By: Ulyses Jarred M.D.   On: 09/18/2019 23:15    Scheduled Meds: . levETIRAcetam  500 mg Oral BID    Continuous Infusions: . sodium chloride 75 mL/hr at 09/19/19 0331  . [START ON 09/20/2019] cefTRIAXone (ROCEPHIN)  IV    . heparin 1,100 Units/hr (09/19/19 0147)     LOS: 0 days     Alma Friendly, MD [facility]  If 7PM-7AM, please contact night-coverage [email or web address] 09/19/2019, 2:12 PM

## 2019-09-19 NOTE — H&P (Signed)
History and Physical    [name] [name] [id number] DOB: December 27, 1924 DOA: 09/18/2019  PCP: [name], MD Patient coming from: Nursing home  Chief Complaint: Unwitnessed fall, seizure  HPI: [name] [name] is a 86 y.o. male with medical history significant of amnesia, CAD, chronic diastolic congestive heart failure, hypertension, hyperlipidemia, BPH, history of falls, meningioma presenting to the ED from his nursing home to be evaluated after an unwitnessed fall at approximately 2200.  It is reported that the patient was found down at his nursing home after having fallen from a chair.  He was confused after the fall.  He was placed in the bed and almost immediately thereafter had convulsive activity involving the right side only which lasted approximately 30 seconds.  Following this the patient was somnolent and unarousable.  At baseline patient is AAO x4.  EMS was called and code stroke activated upon arrival to the ED.  Patient is currently oriented to self only and confused.  No history could be obtained from him.    ED Course:  Head CT negative for acute intracranial abnormality.  CTA head and neck negative for LVO.  Neurology was consulted for work-up of seizures.  Patient was given a Keppra load.  Patient also found to be in new onset A. fib, rate controlled.  Heparin was started for anticoagulation.  Afebrile.  WBC count 27.7 with left shift.  Lactic acid level pending.  INR 1.1.  Bicarb 20, anion gap 14.  Blood glucose 174.  Creatinine 1.4, baseline 0.9.  LFTs normal.  Blood ethanol level undetectable.  UA with large amount of leukocytes, greater than 50 WBCs, and many bacteria.  Urine culture pending.  SARS-CoV-2 PCR test pending.  Blood culture x2 pending.  Chest x-ray showing no active disease.  Review of Systems:  All systems reviewed and apart from history of presenting illness, are negative.  Past Medical History:  Diagnosis Date  . Allergic rhinitis   . Amnesia   .  Atherosclerotic heart disease of native coronary artery without angina pectoris   . BPH (benign prostatic hyperplasia)   . Bradycardia   . Chronic diastolic (congestive) heart failure ([place])   . Colon polyps   . Coronary artery disease    MODERATE  . Dizziness   . Edema of lower extremity   . Erectile dysfunction   . H. pylori infection    Hx of   . Hard of hearing   . Headache(784.0)   . History of colon polyps 2009   Dr. Orr/Colonoscopy -small cecal adenoma  . History of falling   . Hx of colonoscopy 2009   Dr Lajoyce Corners, hemorrhoids & polyps  . Hyperlipidemia   . Hypertension   . Hypertensive heart disease with heart failure ([place])   . Hypokalemia   . Internal hemorrhoids 2009   Dr. Lajoyce Corners Colonoscopy  . Metabolic encephalopathy   . Mitral valve regurgitation   . Muscle weakness (generalized)   . Pneumonia   . Primary generalized (osteo)arthritis   . Reflux esophagitis   . Sepsis ([place]) 11/04/2018  . Unspecified hemorrhoids   . Unsteadiness on feet   . Vitamin D deficiency     Past Surgical History:  Procedure Laterality Date  . APPENDECTOMY    . CARDIAC CATHETERIZATION  1997   REVEALED MILD TO MODERATE IRREGULARITIES. HIS LEFT EVNTRICULAR SYSTOLIC FUNCTION REVEALED NORMAL EJECTION FRACTION WITH EF OF 70%  . CATARACT EXTRACTION, BILATERAL    . COLONOSCOPY  multiple  . fatty tumor  removal     benign  . HEMORROIDECTOMY    . HIP ARTHROPLASTY Left 08/17/2013   Procedure: ARTHROPLASTY BIPOLAR HIP;  Surgeon: Gearlean Alf, MD;  Location: [facility];  Service: Orthopedics;  Laterality: Left;  . INGUINAL HERNIA REPAIR Bilateral 2006  . lower back surgery  2006  . NASAL SEPTUM SURGERY    . TONSILLECTOMY    . UPPER GASTROINTESTINAL ENDOSCOPY       reports that he quit smoking about 43 years ago. He has never used smokeless tobacco. He reports current alcohol use. He reports that he does not use drugs.  No Known Allergies  Family History  Problem Relation Age of Onset  . Colon  cancer Mother 12       second cancer in 44's deceased after emergency surgery  . Breast cancer Sister     Prior to Admission medications   Medication Sig Start Date End Date Taking? Authorizing Provider  acetaminophen (TYLENOL) 500 MG tablet Take 500 mg by mouth every 8 (eight) hours as needed.     [provider]  acetaminophen (TYLENOL) 650 MG CR tablet Take 650 mg by mouth 2 (two) times daily.    [provider]  amLODipine (NORVASC) 10 MG tablet Take 1 tablet (10 mg total) by mouth daily. 03/08/18   [name], [name], MD  Cholecalciferol 25 MCG (1000 UT) capsule Take 1,000 Units by mouth daily.    [provider]  DEXTRAN 70-HYPROMELLOSE OP Apply 1 drop to eye daily as needed. 11/26/18   [provider]  furosemide (LASIX) 40 MG tablet Take 40 mg by mouth.    [provider]  gabapentin (NEURONTIN) 100 MG capsule Take 200 mg by mouth at bedtime. 02/11/19   [provider]  hydrocortisone (ANUSOL-HC) 2.5 % rectal cream Place 1 application rectally daily as needed for hemorrhoids or anal itching. With perineal applicator    [provider]  lisinopril (ZESTRIL) 20 MG tablet Take 20 mg by mouth daily.    [provider]  methocarbamol (ROBAXIN) 500 MG tablet Take 250 mg by mouth daily as needed for muscle spasms. per Georg Ruddle 02/11/19   [provider]  methocarbamol (ROBAXIN) 500 MG tablet Take 250 mg by mouth daily.    [provider]  Multiple Vitamin (MULTIVITAMIN) tablet Take 1 tablet by mouth daily.      [provider]  omeprazole (PRILOSEC) 20 MG capsule Take 20 mg by mouth daily.    [provider]  potassium chloride (KLOR-CON) 20 MEQ packet Take 20 mEq by mouth daily.    [provider]  rosuvastatin (CRESTOR) 5 MG tablet Take 5 mg by mouth daily. Takes rarely     [provider]  sodium chloride (DEEP SEA NASAL SPRAY) 0.65 % nasal spray Place 1 spray into the  nose daily as needed for congestion.    [provider]  traZODone (DESYREL) 50 MG tablet Take 0.5 tablets (25 mg total) by mouth at bedtime. 11/04/18   Aline August, MD  vitamin B-12 (CYANOCOBALAMIN) 1000 MCG tablet Take 1,000 mcg by mouth daily.    [provider]  Zinc 50 MG TABS Take 100 mg by mouth daily. On Mon and Friday    [provider]    Physical Exam: Vitals:   09/19/19 0000 09/19/19 0100 09/19/19 0200 09/19/19 0243  BP:  130/68 (!) 119/58   Pulse:  91 (!) 109   Resp:  (!) 22 14   Temp:  98.4 F (36.9 C)  TempSrc:    Oral  SpO2:  (!) 39% 96%   Weight:      Height: 5\' 7"  (1.702 m)       Physical Exam  Constitutional: He appears well-developed and well-nourished. No distress.  HENT:  Head: Normocephalic.  Eyes: Right eye exhibits no discharge. Left eye exhibits no discharge.  Cardiovascular: Normal rate, regular rhythm and intact distal pulses.  Pulmonary/Chest: Effort normal and breath sounds normal. No respiratory distress. He has no wheezes. He has no rales.  Abdominal: Soft. Bowel sounds are normal. He exhibits distension. There is no abdominal tenderness. There is no rebound and no guarding.  Musculoskeletal:        General: No edema.     Cervical back: Neck supple.  Neurological:  Awake and oriented to self only Confused and not following commands Moving all extremities spontaneously  Skin: Skin is warm and dry. He is not diaphoretic.     Labs on Admission: I have personally reviewed following labs and imaging studies  CBC: Recent Labs  Lab 09/18/19 2256 09/18/19 2305 09/19/19 0319  WBC 27.7*  --  25.8*  NEUTROABS 24.2*  --   --   HGB 14.6 14.3 15.1  HCT 43.2 42.0 43.8  MCV 97.3  --  94.8  PLT 268  --  [id number]   Basic Metabolic Panel: Recent Labs  Lab 09/18/19 2256 09/18/19 2305  NA 135 133*  K 4.2 4.0  CL 101 99  CO2 20*  --   GLUCOSE 174* 164*  BUN 22 24*  CREATININE 1.47* 1.50*  CALCIUM 9.2  --     GFR: Estimated Creatinine Clearance: 29.4 mL/min (A) (by C-G formula based on SCr of 1.5 mg/dL (H)). Liver Function Tests: Recent Labs  Lab 09/18/19 2256  AST 39  ALT 27  ALKPHOS 71  BILITOT 0.8  PROT 6.3*  ALBUMIN 3.8   No results for input(s): LIPASE, AMYLASE in the last 168 hours. No results for input(s): AMMONIA in the last 168 hours. Coagulation Profile: Recent Labs  Lab 09/18/19 2256  INR 1.1   Cardiac Enzymes: No results for input(s): CKTOTAL, CKMB, CKMBINDEX, TROPONINI in the last 168 hours. BNP (last 3 results) No results for input(s): PROBNP in the last 8760 hours. HbA1C: No results for input(s): HGBA1C in the last 72 hours. CBG: No results for input(s): GLUCAP in the last 168 hours. Lipid Profile: No results for input(s): CHOL, HDL, LDLCALC, TRIG, CHOLHDL, LDLDIRECT in the last 72 hours. Thyroid Function Tests: No results for input(s): TSH, T4TOTAL, FREET4, T3FREE, THYROIDAB in the last 72 hours. Anemia Panel: No results for input(s): VITAMINB12, FOLATE, FERRITIN, TIBC, IRON, RETICCTPCT in the last 72 hours. Urine analysis:    Component Value Date/Time   COLORURINE YELLOW 09/19/2019 0148   APPEARANCEUR CLOUDY (A) 09/19/2019 0148   LABSPEC >1.046 (H) 09/19/2019 0148   PHURINE 5.0 09/19/2019 0148   GLUCOSEU NEGATIVE 09/19/2019 0148   HGBUR NEGATIVE 09/19/2019 0148   BILIRUBINUR NEGATIVE 09/19/2019 0148   KETONESUR NEGATIVE 09/19/2019 0148   PROTEINUR 30 (A) 09/19/2019 0148   UROBILINOGEN 0.2 08/17/2013 0105   NITRITE NEGATIVE 09/19/2019 0148   LEUKOCYTESUR LARGE (A) 09/19/2019 0148    Radiological Exams on Admission: CT Code Stroke CTA Head W/WO contrast  Result Date: 09/18/2019 CLINICAL DATA:  Seizure and altered mental status EXAM: CT ANGIOGRAPHY HEAD AND NECK TECHNIQUE: Multidetector CT imaging of the head and neck was performed using the standard protocol during bolus administration of  intravenous contrast. Multiplanar CT image reconstructions  and MIPs were obtained to evaluate the vascular anatomy. Carotid stenosis measurements (when applicable) are obtained utilizing NASCET criteria, using the distal internal carotid diameter as the denominator. CONTRAST:  41mL OMNIPAQUE IOHEXOL 350 MG/ML SOLN COMPARISON:  Head CT 09/18/2019 FINDINGS: The examination is markedly degraded by motion. CTA NECK FINDINGS SKELETON: There is no bony spinal canal stenosis. No lytic or blastic lesion. OTHER NECK: Normal pharynx, larynx and major salivary glands. No cervical lymphadenopathy. Unremarkable thyroid gland. UPPER CHEST: No pneumothorax or pleural effusion. No nodules or masses. AORTIC ARCH: There is mild calcific atherosclerosis of the aortic arch. There is no aneurysm, dissection or hemodynamically significant stenosis of the visualized portion of the aorta. Conventional 3 vessel aortic branching pattern. The visualized proximal subclavian arteries are widely patent. RIGHT CAROTID SYSTEM: No dissection, occlusion or aneurysm. Mild atherosclerotic calcification at the carotid bifurcation without hemodynamically significant stenosis. LEFT CAROTID SYSTEM: No dissection, occlusion or aneurysm. Mild atherosclerotic calcification at the carotid bifurcation without hemodynamically significant stenosis. VERTEBRAL ARTERIES: Left dominant configuration. Both origins are clearly patent. There is no dissection, occlusion or flow-limiting stenosis to the skull base (V1-V3 segments). CTA HEAD FINDINGS POSTERIOR CIRCULATION: --Vertebral arteries: Normal V4 segments. --Posterior inferior cerebellar arteries (PICA): Patent origins from the vertebral arteries. --Anterior inferior cerebellar arteries (AICA): Patent origins from the basilar artery. --Basilar artery: Normal. --Superior cerebellar arteries: Normal. --Posterior cerebral arteries: Normal. The right PCA is partially supplied by a posterior communicating artery (p-comm). ANTERIOR CIRCULATION: --Intracranial internal carotid  arteries: Normal. --Anterior cerebral arteries (ACA): Normal. Both A1 segments are present. Patent anterior communicating artery (a-comm). --Middle cerebral arteries (MCA): Normal. VENOUS SINUSES: As permitted by contrast timing, patent. ANATOMIC VARIANTS: None Review of the MIP images confirms the above findings. IMPRESSION: 1. Motion degraded study. Within that limitation, no large vessel occlusion or hemodynamically significant stenosis of the head or neck. 2.  Aortic atherosclerosis (ICD10-I70.0). Electronically Signed   By: Ulyses Jarred M.D.   On: 09/18/2019 23:41   DG Chest Port 1 View  Result Date: 09/19/2019 CLINICAL DATA:  Altered mental status, status post fall. EXAM: PORTABLE CHEST 1 VIEW COMPARISON:  Nov 01, 2018 FINDINGS: There is no evidence of acute infiltrate, pleural effusion or pneumothorax. The heart size and mediastinal contours are within normal limits. There is moderate severity calcification of the aortic arch. The visualized skeletal structures are unremarkable. IMPRESSION: No active disease. Electronically Signed   By: Virgina Norfolk M.D.   On: 09/19/2019 02:18   CT HEAD CODE STROKE WO CONTRAST  Result Date: 09/18/2019 CLINICAL DATA:  Code stroke.  Seizure and altered mental status EXAM: CT HEAD WITHOUT CONTRAST TECHNIQUE: Contiguous axial images were obtained from the base of the skull through the vertex without intravenous contrast. COMPARISON:  11/01/2018 FINDINGS: Brain: There is no mass, hemorrhage or extra-axial collection. The size and configuration of the ventricles and extra-axial CSF spaces are normal. There is encephalomalacia of the anterior left temporal lobe, unchanged. There is periventricular hypoattenuation compatible with chronic microvascular disease. Vascular: No abnormal hyperdensity of the major intracranial arteries or dural venous sinuses. No intracranial atherosclerosis. Skull: The visualized skull base, calvarium and extracranial soft tissues are normal.  Sinuses/Orbits: No fluid levels or advanced mucosal thickening of the visualized paranasal sinuses. No mastoid or middle ear effusion. The orbits are normal. ASPECTS [facility] Stroke Program Early CT Score) - Ganglionic level infarction (caudate, lentiform nuclei, internal capsule, insula, M1-M3 cortex): 7 - Supraganglionic infarction (M4-M6 cortex): 3 Total score (  0-10 with 10 being normal): 10 IMPRESSION: 1. No acute hemorrhage or mass lesion. 2. ASPECTS is 10. * These results were communicated to Dr. Roland Rack at 11:14 pm on 09/18/2019 by text page via the [facility] messaging system. Electronically Signed   By: Ulyses Jarred M.D.   On: 09/18/2019 23:15    EKG: Independently reviewed.  A. fib, heart rate 82.  Assessment/Plan Principal Problem:   Seizure ([place]) Active Problems:   Acute metabolic encephalopathy   New onset atrial fibrillation (HCC)   UTI (urinary tract infection)   AKI (acute kidney injury) (HCC)   Partial seizure: Head CT negative for acute intracranial abnormality.  Patient was seen by neurology and Keppra loading dose administered.  He was unarousable on arrival to the ED. Currently improved and able to tell me his name, although still quite confused and not following commands. -Plan: Continue Keppra 500 mg twice daily and Ativan as needed.  Brain MRI and EEG ordered.  Follow seizure precautions.  New onset A. Fib: Currently rate controlled.  CHA2DS2VASc at least 5. Started on heparin for anticoagulation in the ED.  Consult cardiology in the morning to discuss risk versus benefits of ongoing anticoagulation given patient's advanced age and history of frequent falls. Continue cardiac monitoring and order echocardiogram.    UTI: UA with evidence of pyuria and bacteriuria.  Afebrile, however, does have significant leukocytosis.  Lactic acid level pending.  Start ceftriaxone.  Urine culture pending.  Blood culture x2 pending.  Continue to monitor WBC count.  AKI: Creatinine  1.4, baseline 0.9.  Likely prerenal due to dehydration and home diuretic plus ACE inhibitor use.  Give gentle IV fluid hydration.  Monitor renal function and urine output.  Avoid nephrotoxic agents.  Abdomen appears slightly distended and nursing staff reported only 150 cc urine output.  Bladder scan ordered to check for acute urinary retention given history of BPH.  Acute metabolic encephalopathy: Likely related to postictal state from seizure, UTI, and AKI.  Management as above.  Continue to monitor.  Mild normal anion gap metabolic acidosis: Likely related to AKI.  Continue gentle IV fluid hydration and repeat BMP in a.m.  Chronic diastolic congestive heart failure: No signs of volume overload at this time.  Hypertension: Currently normotensive  History of frequent falls: Robaxin, gabapentin, and trazodone listed in home medications and likely contributing.  PT and OT evaluation ordered.  Follow fall precautions.  Avoid sedating medications.  Pharmacy med rec pending.  DVT prophylaxis: Heparin Code Status: DNR.  Signed form present at bedside. Family Communication: No family available at this time. Disposition Plan: Anticipate discharge to SNF after clinical improvement. Consults called: Neurology Admission status: It is my clinical opinion that admission to INPATIENT is reasonable and necessary because of the expectation that this patient will require hospital care that crosses at least 2 midnights to treat this condition based on the medical complexity of the problems presented.  Given the aforementioned information, the predictability of an adverse outcome is felt to be significant.  The medical decision making on this patient was of high complexity and the patient is at high risk for clinical deterioration, therefore this is a level 3 visit.  Shela Leff MD [facility]  If 7PM-7AM, please contact night-coverage [email or web address]  09/19/2019, 3:37 AM

## 2019-09-19 NOTE — Progress Notes (Signed)
  Echocardiogram 2D Echocardiogram has been performed.  Danylle Ouk G Santrice Muzio 09/19/2019, 3:09 PM

## 2019-09-19 NOTE — ED Notes (Signed)
Bladder scan showed 450 ml. Will, RN notified.

## 2019-09-19 NOTE — Progress Notes (Signed)
EEG complete - results pending 

## 2019-09-19 NOTE — Code Documentation (Signed)
Responded to Code Stroke called at 2303 on pt already in ED. Pt came to ED from nursing home s/p unwitnessed fall. Per staff at facility, pt had some seizure-like activity after getting pt back in bed. NIH-20, CT head negative, CTA-no LVO. Plan to w/o for seizures.

## 2019-09-19 NOTE — Progress Notes (Signed)
Occupational Therapy Evaluation Patient Details Name: [name] [name] MRN: [id number] DOB: 01-Apr-1925 Today's Date: 09/19/2019    History of Present Illness 86 yo male admitted 4/4 for AMS, fall at ALF. Initial workup for CVA negative, workup now for seizures. PMH includes amnesia, CAD, chronic diastolic congestive heart failure, hypertension, hyperlipidemia, BPH, history of falls, meningioma.   Clinical Impression   Unsure of accuracy of PLOF as pt is confused. PTA pt resided at an ALF, reporting independence in all ADL and mobility tasks. Pt currently requires setup to total assist for self-care and functional transfer tasks. Pt restless and impulsive throughout session requiring multimodal cues for safety. Pt is extremely HOH requiring white board to write out commands. Pt tolerated sitting EOB ~15 min with supervision, noting 0 instances of LOB. Pt tolerated standing ~3 min with RW and mod assist for balance and safety. Pt noted to be soiled of urine requiring max assist to get cleaned up in standing. Pt able to side step up towards the [facility] with mod assist for balance. Pt required increased time and cues to complete all tasks. Pt assisted back to bed and positioned for comfort. Pt demonstrates decreased strength, endurance, balance, standing tolerance, activity tolerance, and safety awareness impacting ability to complete self-care and functional transfer tasks. Recommend skilled OT services to address above deficits in order to promote function and prevent further decline. Recommend SNF placement for additional rehab prior to discharge home.     Follow Up Recommendations  SNF;Supervision/Assistance - 24 hour    Equipment Recommendations  Other (comment)(TBD at next venue of care)    Recommendations for Other Services       Precautions / Restrictions Precautions Precautions: Fall;Other (comment) Precaution Comments: Extremely HOH Restrictions Weight Bearing Restrictions: No       Mobility Bed Mobility Overal bed mobility: Needs Assistance Bed Mobility: Supine to Sit;Sit to Supine Rolling: Mod assist Sidelying to sit: Mod assist;HOB elevated Supine to sit: Min assist;HOB elevated Sit to supine: Min guard;HOB elevated   General bed mobility comments: Pt able to initiate task and bring BLEs off bed. Assist for trunk.   Transfers Overall transfer level: Needs assistance Equipment used: Rolling walker (2 wheeled) Transfers: Sit to/from Stand Sit to Stand: Mod assist         General transfer comment: Pt able to stand with mod assist for ~3 min with RW. Pt able to side step up towards the [facility] with mod assist for balance. Pt very unsteady on feet.     Balance Overall balance assessment: Needs assistance Sitting-balance support: Feet supported Sitting balance-Leahy Scale: Fair     Standing balance support: Bilateral upper extremity supported Standing balance-Leahy Scale: Poor Standing balance comment: unable to stand upright even with max PT assist                           ADL either performed or assessed with clinical judgement   ADL Overall ADL's : Needs assistance/impaired Eating/Feeding: Set up;Bed level   Grooming: Set up;Supervision/safety;Wash/dry hands;Wash/dry face;Sitting   Upper Body Bathing: Minimal assistance;Sitting   Lower Body Bathing: Maximal assistance;Sit to/from stand   Upper Body Dressing : Minimal assistance;Sitting   Lower Body Dressing: Total assistance;Sit to/from stand   Toilet Transfer: Moderate assistance;Maximal assistance;+2 for physical assistance;BSC   Toileting- Clothing Manipulation and Hygiene: Maximal assistance;Sit to/from stand       Functional mobility during ADLs: Moderate assistance;Rolling walker;+2 for physical assistance General ADL Comments: Pt tolerated  sitting EOB 15+ min with supervision, noting 0 instances of LOB. Pt able to stand ~3 min with RW and mod assist. Pt able to side step  up towards [facility] with mod assist. Noted 0 instances of LOB, however pt very unsteady on feet.      Vision         Perception     Praxis      Pertinent Vitals/Pain Pain Assessment: No/denies pain Faces Pain Scale: Hurts little more Pain Location: low back Pain Descriptors / Indicators: Sore;Discomfort Pain Intervention(s): Limited activity within patient's tolerance;Monitored during session;Repositioned     Hand Dominance Right   Extremity/Trunk Assessment Upper Extremity Assessment Upper Extremity Assessment: Generalized weakness   Lower Extremity Assessment Lower Extremity Assessment: Defer to PT evaluation   Cervical / Trunk Assessment Cervical / Trunk Assessment: Normal   Communication Communication Communication: HOH(EXTREMELY - had to write out questions and gesture)   Cognition Arousal/Alertness: Awake/alert Behavior During Therapy: Restless Overall Cognitive Status: No family/caregiver present to determine baseline cognitive functioning Area of Impairment: Orientation;Attention;Memory;Following commands;Safety/judgement;Problem solving                 Orientation Level: Disoriented to;Place;Time;Situation Current Attention Level: Sustained Memory: Decreased short-term memory Following Commands: Follows one step commands inconsistently Safety/Judgement: Decreased awareness of safety;Decreased awareness of deficits   Problem Solving: Slow processing;Decreased initiation;Difficulty sequencing;Requires tactile cues;Requires verbal cues General Comments: A&Ox 1. Pt appears confused, however difficulty to fully assess cognition as pt is extremely HOH. Pt is easily distracted by personal conversation requiring mod to max redirection back to tasks.    General Comments  No signs/symptoms of distress    Exercises     Shoulder Instructions      Home Living Family/patient expects to be discharged to:: Assisted living                              Home Equipment: Walker - 4 wheels   Additional Comments: Unsure of accuracy of PLOF as pt appears confused.       Prior Functioning/Environment Level of Independence: Needs assistance  Gait / Transfers Assistance Needed: Pt reports "I use my rolling chair (assuming rollator) to walk around, I get up by myself" ADL's / Homemaking Assistance Needed: Pt reports being independent in all ADLs PTA   Comments: Pt is extremely HOH        OT Problem List: Decreased strength;Decreased activity tolerance;Impaired balance (sitting and/or standing);Decreased cognition;Decreased safety awareness      OT Treatment/Interventions: Self-care/ADL training;Therapeutic exercise;Neuromuscular education;Energy conservation;DME and/or AE instruction;Therapeutic activities;Cognitive remediation/compensation;Patient/family education;Balance training    OT Goals(Current goals can be found in the care plan section) Acute Rehab OT Goals Patient Stated Goal: Pt agreeable to working with therapy Time For Goal Achievement: 10/03/19 Potential to Achieve Goals: Good ADL Goals Pt Will Perform Grooming: with set-up;sitting Pt Will Perform Lower Body Bathing: with mod assist;sit to/from stand Pt Will Transfer to Toilet: with mod assist;bedside commode Pt Will Perform Toileting - Clothing Manipulation and hygiene: with mod assist;sit to/from stand Additional ADL Goal #1: Pt to tolerate standing up to 5 min with min assist, in preparation for ADLs.  OT Frequency: Min 2X/week   Barriers to D/C:            Co-evaluation              AM-PAC OT "6 Clicks" Daily Activity     Outcome Measure Help from another person eating meals?:  A Little Help from another person taking care of personal grooming?: A Little Help from another person toileting, which includes using toliet, bedpan, or urinal?: A Lot Help from another person bathing (including washing, rinsing, drying)?: A Lot Help from another person to put on  and taking off regular upper body clothing?: A Little Help from another person to put on and taking off regular lower body clothing?: Total 6 Click Score: 14   End of Session Equipment Utilized During Treatment: Gait belt;Rolling walker Nurse Communication: Mobility status  Activity Tolerance: Patient tolerated treatment well Patient left: in bed;with call bell/phone within reach;with bed alarm set  OT Visit Diagnosis: Unsteadiness on feet (R26.81);Muscle weakness (generalized) (M62.81);History of falling (Z91.81)                Time: [id number] OT Time Calculation (min): 32 min Charges:  OT General Charges $OT Visit: 1 Visit OT Evaluation $OT Eval Moderate Complexity: 1 Mod OT Treatments $Self Care/Home Management : 8-22 mins  Mauri Brooklyn OTR/L [phone]  Mauri Brooklyn 09/19/2019, 2:01 PM

## 2019-09-19 NOTE — Progress Notes (Signed)
NEUROLOGY PROGRESS NOTE   Subjective: No complaints at this time.  Focusing on getting back to his nursing home.  Also perseverating on tests that need to be done and if there are any more.  Exam: Vitals:   09/19/19 0423 09/19/19 0815  BP: (!) 128/50 121/77  Pulse:  79  Resp: 17 (!) 21  Temp: 98 F (36.7 C) 97.7 F (36.5 C)  SpO2: 96% 96%   Physical Exam  Constitutional: Appears well-developed and well-nourished.  Eyes: No scleral injection HENT: No OP obstrucion Head: Normocephalic.  Cardiovascular: Normal rate and regular rhythm.  Respiratory: Effort normal, non-labored breathing GI: Soft.  No distension. There is no tenderness.  Skin: WDI, hemosiderin staining in a stocking distribution   Neuro:  Mental Status: Alert, oriented, thought content appropriate.  Speech fluent without evidence of aphasia.  Able to follow simple commands without difficulty.  Initially was not oriented however, after realizing he was very hard of hearing it was noticed that he is completely alert and oriented Cranial Nerves: II:  Visual fields grossly normal,  III,IV, VI: ptosis not present, extra-ocular motions intact bilaterally pupils equal, round, reactive to light and accommodation V,VII: smile symmetric, facial light touch sensation normal bilaterally VIII: Very hard of hearing Motor: Right : Upper extremity   5/5    Left:     Upper extremity   5/5  Lower extremity   5/5     Lower extremity   5/5 Tone and bulk:normal tone throughout; no atrophy noted Sensory: Pinprick and light touch intact Deep Tendon Reflexes: 2+ and symmetric throughout with no ankle jerk Plantars: Right: downgoing   Left: downgoing Cerebellar: normal finger-to-nose,    Medications:  Scheduled: . levETIRAcetam  500 mg Oral BID    Pertinent Labs/Diagnostics: Potassium 5.2 EEG-pending Urinalysis shows large leukocytes, greater than 50 white blood cell count, and many bacteria Urine culture pending  CT Code  Stroke CTA Head W/WO contrast  Result Date: 09/18/2019 . IMPRESSION: 1. Motion degraded study. Within that limitation, no large vessel occlusion or hemodynamically significant stenosis of the head or neck. 2.  Aortic atherosclerosis (ICD10-I70.0). Electronically Signed   By: Ulyses Jarred M.D.   On: 09/18/2019 23:41     MR BRAIN WO CONTRAST  Result Date: 09/19/2019  IMPRESSION: 1. Chronic vasogenic edema in the left temporal lobe correlating with a middle cranial fossa meningioma reported in 2017. The previously seen enhancing mass is not visible on this motion degraded and noncontrast study which was truncated. 2. Background senescent changes. Electronically Signed   By: Monte Fantasia M.D.   On: 09/19/2019 07:22    CT HEAD CODE STROKE WO CONTRAST  Result Date: 09/18/2019  IMPRESSION: 1. No acute hemorrhage or mass lesion. 2. ASPECTS is 10. * These results were communicated to Dr. Roland Rack at 11:14 pm on 09/18/2019 by text page via the [facility] messaging system. Electronically Signed   By: Ulyses Jarred M.D.   On: 09/18/2019 23:15     Etta Quill PA-C [facility] [id number]  Assessment:  86 year old male with history of meningioma and MRI showing no change.  Given patient's UTI and meningioma, episode most likely partial seizure.  Currently on Keppra 500 mg twice daily and showing no active seizure activity. Exam shows no nero deficits other than significant presbycusis.   Recommendations: -Keppra 500 mg twice daily -EEG pending -Outpatient neurology follow-up in 1 to 2 months  -Per [facility] statutes, patients with seizures are not allowed to drive until  they have been seizure-free for six months. Use caution when using heavy equipment or power tools. Avoid working on ladders or at heights. Take showers instead of baths. Ensure the water temperature is not too high on the home water heater. Do not go swimming alone. When caring for infants or small children,  sit down when holding, feeding, or changing them to minimize risk of injury to the child in the event you have a seizure.   Also, Maintain good sleep hygiene. Avoid alcohol.   09/19/2019, 8:41 AM   NEUROHOSPITALIST ADDENDUM Performed a face to face diagnostic evaluation.   I have reviewed the contents of history and physical exam as documented by PA/ARNP/Resident and agree with above documentation.  I have discussed and formulated the above plan as documented. Edits to the note have been made as needed.  Seizures likely partial from UTI/meningioma.  Recommended continue Keppra 500 mg twice daily and outpatient neurology follow-up in 1 to 2 months.  Seizure precautions as stated above.  Neurology will be available as needed   Karena Addison Treacy Holcomb MD [facility] [phone]   If 7pm to 7am, please call on call as listed on AMION.

## 2019-09-20 DIAGNOSIS — N3000 Acute cystitis without hematuria: Secondary | ICD-10-CM

## 2019-09-20 DIAGNOSIS — I4891 Unspecified atrial fibrillation: Secondary | ICD-10-CM

## 2019-09-20 LAB — BASIC METABOLIC PANEL
Anion gap: 10 (ref 5–15)
BUN: 11 mg/dL (ref 8–23)
CO2: 24 mmol/L (ref 22–32)
Calcium: 8.8 mg/dL — ABNORMAL LOW (ref 8.9–10.3)
Chloride: 102 mmol/L (ref 98–111)
Creatinine, Ser: 0.87 mg/dL (ref 0.61–1.24)
GFR calc Af Amer: >60 mL/min (ref >60)
GFR calc non Af Amer: >60 mL/min (ref >60)
Glucose, Bld: 95 mg/dL (ref 70–99)
Potassium: 3.7 mmol/L (ref 3.5–5.1)
Sodium: 136 mmol/L (ref 135–145)

## 2019-09-20 LAB — CBC WITH DIFFERENTIAL/PLATELET
Abs Immature Granulocytes: 0.06 10*3/uL (ref 0.00–0.07)
Basophils Absolute: 0 10*3/uL (ref 0.0–0.1)
Basophils Relative: 0 %
Eosinophils Absolute: 0.2 10*3/uL (ref 0.0–0.5)
Eosinophils Relative: 2 %
HCT: 39.9 % (ref 39.0–52.0)
Hemoglobin: 13.8 g/dL (ref 13.0–17.0)
Immature Granulocytes: 1 %
Lymphocytes Relative: 16 %
Lymphs Abs: 1.9 10*3/uL (ref 0.7–4.0)
MCH: 32.7 pg (ref 26.0–34.0)
MCHC: 34.6 g/dL (ref 30.0–36.0)
MCV: 94.5 fL (ref 80.0–100.0)
Monocytes Absolute: 1.4 10*3/uL — ABNORMAL HIGH (ref 0.1–1.0)
Monocytes Relative: 12 %
Neutro Abs: 8.2 10*3/uL — ABNORMAL HIGH (ref 1.7–7.7)
Neutrophils Relative %: 69 %
Platelets: 189 10*3/uL (ref 150–400)
RBC: 4.22 MIL/uL (ref 4.22–5.81)
RDW: 12 % (ref 11.5–15.5)
WBC: 11.8 10*3/uL — ABNORMAL HIGH (ref 4.0–10.5)
nRBC: 0 % (ref 0.0–0.2)

## 2019-09-20 LAB — PROCALCITONIN: Procalcitonin: 0.59 ng/mL

## 2019-09-20 LAB — GLUCOSE, CAPILLARY: Glucose-Capillary: 84 mg/dL (ref 70–99)

## 2019-09-20 MED ORDER — AMLODIPINE BESYLATE 10 MG PO TABS: 10.0000 mg | ORAL_TABLET | Freq: Every day | ORAL | Status: DC

## 2019-09-20 MED ORDER — PANTOPRAZOLE SODIUM 40 MG PO TBEC
40.0000 mg | DELAYED_RELEASE_TABLET | Freq: Every day | ORAL | Status: DC
  Administered 2019-09-21 – 2019-09-23 (×3): 40 mg via ORAL
  Filled 2019-09-20 (×3): qty 1

## 2019-09-20 MED ORDER — AMLODIPINE BESYLATE 5 MG PO TABS: 5.0000 mg | ORAL_TABLET | Freq: Every day | ORAL | Status: DC

## 2019-09-20 MED ORDER — LISINOPRIL 40 MG PO TABS
40.0000 mg | ORAL_TABLET | Freq: Every day | ORAL | Status: DC
  Administered 2019-09-21 – 2019-09-23 (×3): 40 mg via ORAL
  Filled 2019-09-20 (×3): qty 1

## 2019-09-20 MED ORDER — LISINOPRIL 10 MG PO TABS: 20.0000 mg | ORAL_TABLET | Freq: Every day | ORAL | Status: DC

## 2019-09-20 MED ORDER — TRAZODONE HCL 50 MG PO TABS
25.0000 mg | ORAL_TABLET | Freq: Every day | ORAL | Status: DC
  Administered 2019-09-20 – 2019-09-22 (×3): 25 mg via ORAL
  Filled 2019-09-20 (×3): qty 1

## 2019-09-20 MED ORDER — METHOCARBAMOL 500 MG PO TABS
250.0000 mg | ORAL_TABLET | Freq: Every day | ORAL | Status: DC
  Administered 2019-09-21 – 2019-09-23 (×3): 250 mg via ORAL
  Filled 2019-09-20 (×3): qty 1

## 2019-09-20 MED ORDER — GABAPENTIN 100 MG PO CAPS
200.0000 mg | ORAL_CAPSULE | Freq: Every day | ORAL | Status: DC
  Administered 2019-09-20 – 2019-09-22 (×3): 200 mg via ORAL
  Filled 2019-09-20 (×3): qty 2

## 2019-09-20 MED ORDER — VITAMIN B-12 1000 MCG PO TABS
1000.0000 ug | ORAL_TABLET | Freq: Every day | ORAL | Status: DC
  Administered 2019-09-21 – 2019-09-23 (×3): 1000 ug via ORAL
  Filled 2019-09-20 (×3): qty 1

## 2019-09-20 MED ORDER — ZINC SULFATE 220 (50 ZN) MG PO CAPS
220.0000 mg | ORAL_CAPSULE | ORAL | Status: DC
  Administered 2019-09-23: 12:00:00 220 mg via ORAL
  Filled 2019-09-20: qty 1

## 2019-09-20 NOTE — Evaluation (Signed)
Clinical/Bedside Swallow Evaluation Patient Details  Name: [name] [name] MRN: [id number] Date of Birth: 09-09-1924  Today's Date: 09/20/2019 Time: SLP Start Time (ACUTE ONLY): 1046 SLP Stop Time (ACUTE ONLY): 1100 SLP Time Calculation (min) (ACUTE ONLY): 14 min  Past Medical History:  Past Medical History:  Diagnosis Date  . Allergic rhinitis   . Amnesia   . Atherosclerotic heart disease of native coronary artery without angina pectoris   . BPH (benign prostatic hyperplasia)   . Bradycardia   . Chronic diastolic (congestive) heart failure ([place])   . Colon polyps   . Coronary artery disease    MODERATE  . Dizziness   . Edema of lower extremity   . Erectile dysfunction   . H. pylori infection    Hx of   . Hard of hearing   . Headache(784.0)   . History of colon polyps 2009   Dr. Orr/Colonoscopy -small cecal adenoma  . History of falling   . Hx of colonoscopy 2009   Dr Lajoyce Corners, hemorrhoids & polyps  . Hyperlipidemia   . Hypertension   . Hypertensive heart disease with heart failure ([place])   . Hypokalemia   . Internal hemorrhoids 2009   Dr. Lajoyce Corners Colonoscopy  . Metabolic encephalopathy   . Mitral valve regurgitation   . Muscle weakness (generalized)   . Pneumonia   . Primary generalized (osteo)arthritis   . Reflux esophagitis   . Sepsis ([place]) 11/04/2018  . Unspecified hemorrhoids   . Unsteadiness on feet   . Vitamin D deficiency    Past Surgical History:  Past Surgical History:  Procedure Laterality Date  . APPENDECTOMY    . CARDIAC CATHETERIZATION  1997   REVEALED MILD TO MODERATE IRREGULARITIES. HIS LEFT EVNTRICULAR SYSTOLIC FUNCTION REVEALED NORMAL EJECTION FRACTION WITH EF OF 70%  . CATARACT EXTRACTION, BILATERAL    . COLONOSCOPY  multiple  . fatty tumor removal     benign  . HEMORROIDECTOMY    . HIP ARTHROPLASTY Left 08/17/2013   Procedure: ARTHROPLASTY BIPOLAR HIP;  Surgeon: Gearlean Alf, MD;  Location: [facility];  Service: Orthopedics;  Laterality: Left;  .  INGUINAL HERNIA REPAIR Bilateral 2006  . lower back surgery  2006  . NASAL SEPTUM SURGERY    . TONSILLECTOMY    . UPPER GASTROINTESTINAL ENDOSCOPY     HPI:  86 yo male admitted 4/4 for AMS, fall at ALF. Initial workup for CVA negative, workup now for seizures.  Dx includes new onset Afib, sepsis likely 2/2 UTI, acute metabolic encephalopathy. PMH includes amnesia, CAD, chronic diastolic congestive heart failure, hypertension, hyperlipidemia, BPH, history of falls, meningioma. Bedside swallowing evaluation 11/03/18 completed with recs for regular diet, thin liquids.    Assessment / Plan / Recommendation Clinical Impression  Pt was extremely confused, quite pleasant; unable to follow commands or cease talking during assessment, but he easily accepted foods/liquids that were offered to him.  He drank water from a straw, consumed several graham crackers and bites of applesauce with adequate anticipation/attention to material, functional mastication, brisk swallow response, and no s/s of aspiration.  No concerns for dysphagia nor aspiration at this time.  Pt will need assistance with tray set-up and supervision given his confusion, but he is eating and protecting his airway well. Resume a regular diet. No SLP f/u is needed.  SLP Visit Diagnosis: Dysphagia, unspecified (R13.10)    Aspiration Risk  No limitations    Diet Recommendation   regular solids, thin liquids  Medication Administration: Whole meds with  liquid    Other  Recommendations Oral Care Recommendations: Oral care BID   Follow up Recommendations None      Frequency and Duration            Prognosis        Swallow Study   General Date of Onset: 09/20/19 HPI: 86 yo male admitted 4/4 for AMS, fall at ALF. Initial workup for CVA negative, workup now for seizures.  Dx includes new onset Afib, sepsis likely 2/2 UTI, acute metabolic encephalopathy. PMH includes amnesia, CAD, chronic diastolic congestive heart failure,  hypertension, hyperlipidemia, BPH, history of falls, meningioma. Bedside swallowing evaluation 11/03/18 completed with recs for regular diet, thin liquids.  Type of Study: Bedside Swallow Evaluation Previous Swallow Assessment: see HPI Diet Prior to this Study: NPO Temperature Spikes Noted: No Respiratory Status: Room air History of Recent Intubation: No Behavior/Cognition: Alert;Confused Oral Cavity Assessment: Within Functional Limits Oral Care Completed by SLP: No Oral Cavity - Dentition: Adequate natural dentition Vision: Functional for self-feeding Self-Feeding Abilities: Able to feed self Patient Positioning: Upright in bed Baseline Vocal Quality: Normal Volitional Cough: Cognitively unable to elicit Volitional Swallow: Unable to elicit    Oral/Motor/Sensory Function Overall Oral Motor/Sensory Function: Within functional limits   Ice Chips Ice chips: Within functional limits   Thin Liquid Thin Liquid: Within functional limits    Nectar Thick Nectar Thick Liquid: Not tested   Honey Thick Honey Thick Liquid: Not tested   Puree Puree: Within functional limits   Solid     Solid: Within functional limits      [name] [name] 09/20/2019,11:05 AM   Estill Bamberg L. Tivis Ringer, [place] Office number [phone] Pager [phone]

## 2019-09-20 NOTE — Progress Notes (Signed)
PROGRESS NOTE  BRANCH PELLY [id number] DOB: [birth year] DOA: 09/18/2019 PCP: [name], MD  HPI/Recap of past 24 hours: HPI from Dr Zelphia Cairo is a 86 y.o. male with medical history significant of amnesia, CAD, chronic diastolic congestive heart failure, hypertension, hyperlipidemia, BPH, history of falls, meningioma presenting to the ED from his nursing home to be evaluated after an unwitnessed fall. Patient was found down at his nursing home after having fallen possibly from a chair.  He was confused after the fall. Soon after he was placed on a bed, had an episode of convulsive activity involving the right side only which lasted approximately 30 seconds.  Following this the patient was somnolent and unarousable.  At baseline patient is AAO x4.  EMS was called and code stroke activated upon arrival to the ED. In the ED, head CT negative for acute intracranial abnormality.  CTA head and neck negative for LVO.  Neurology was consulted for work-up of seizures.  Patient was given a Keppra load. Patient was also found to be in new onset A. fib, rate controlled.  Heparin was started for anticoagulation. Labs showed WBC count 27.7 with left shift, Creatinine 1.4, baseline 0.9. UA with large amount of leukocytes, greater than 50 WBCs, and many bacteria.  Urine culture pending.  SARS-CoV-2 PCR negative. Blood culture x2 pending.  Chest x-ray showing no active disease.  Patient admitted for further management.     Today, patient noted to be more alert although still confused, very talkative.  Denies any new complaints.    Assessment/Plan: Principal Problem:   Seizure ([place]) Active Problems:   Acute metabolic encephalopathy   New onset atrial fibrillation (HCC)   UTI (urinary tract infection)   AKI (acute kidney injury) ([place])   Possible partial seizure None witnessed so far here in the hospital, no prior history as per daughter Head CT negative for any acute  intracranial abnormality CTA head and neck negative for LVO MRI brain showed chronic vasogenic edema in the left temporal lobe correlating with a middle cranial fossa meningioma EEG suggestive of mild diffuse encephalopathy.  No seizures or epileptiform discharges were seen Neurology on board, continue Keppra, Ativan as needed Seizure precautions  Sepsis likely 2/2 UTI Currently afebrile, with leukocytosis, tachycardia, lactic acidosis LA 3.7 --> resolved Procalcitonin 0.74-->0.59 UA positive for infection, UC >100,000 gram neg rods  BC x2 NGTD Continue ceftriaxone, pending sensitivities  New onset A. Fib Currently rate controlled CHA2DS2-VASc, around 5 Echo showed EF of 65 to [id number], grade 1 diastolic dysfunction Not a candidate for anticoagulation, as patient is elderly, with increased risk of falls/seizures.  Discussed extensively with daughter Terriel Eilts on 09/19/2019 who understands his increased risk of bleeding versus CVA from A. Fib.  Daughter is in agreement not to start any anticoagulation Discontinue IV heparin Telemetry  AKI/acute urinary retention/dehydration Likely 2/2 poor oral intake/dehydration (labs ??hemoconcentration on admission) UA with increased specific gravity D/C IV fluids As needed bladder scan/In-N-Out cath Daily BMP  Acute metabolic encephalopathy Likely multifactorial, possible recent seizure/post ictal state, possible UTI/sepsis, dehydration, possible underlying dementia Management as above  Chronic diastolic CHF No signs of volume overload, appears dry Repeat echo as above Hold home Lasix  Hypertension BP now uncontrolled Restart home amlodipine, lisinopril  History of frequent falls Likely multifactorial Fall precautions PT/OT          Malnutrition Type:      Malnutrition Characteristics:      Nutrition Interventions:  Estimated body mass index is 26.38 kg/m as calculated from the following:   Height as of  this encounter: 5\' 7"  (1.702 m).   Weight as of this encounter: 76.4 kg.     Code Status: DNR  Family Communication: Discussed with daughter on 09/19/2019  Disposition Plan: Likely back to nursing home   Consultants:  None  Procedures:  None  Antimicrobials:  Ceftriaxone  DVT prophylaxis: Lovenox   Objective: Vitals:   09/20/19 0600 09/20/19 0609 09/20/19 0812 09/20/19 1119  BP: (!) 195/90 (!) 175/66 (!) 192/61 (!) 158/82  Pulse:   76 81  Resp:   17 18  Temp:   98.4 F (36.9 C) 98.9 F (37.2 C)  TempSrc:   Oral Axillary  SpO2:   99% 96%  Weight:      Height:        Intake/Output Summary (Last 24 hours) at 09/20/2019 1536 Last data filed at 09/20/2019 0525 Gross per 24 hour  Intake --  Output 650 ml  Net -650 ml   Filed Weights   09/18/19 2351 09/19/19 0423  Weight: 78.9 kg 76.4 kg    Exam:  General: NAD, confused  Cardiovascular: S1, S2 present  Respiratory: CTAB  Abdomen: Soft, nontender, mildly distended, bowel sounds present  Musculoskeletal: No bilateral pedal edema noted  Skin: Normal  Psychiatry: Unable to assess   Data Reviewed: CBC: Recent Labs  Lab 09/18/19 2256 09/18/19 2305 09/19/19 0319 09/20/19 0301  WBC 27.7*  --  25.8* 11.8*  NEUTROABS 24.2*  --   --  8.2*  HGB 14.6 14.3 15.1 13.8  HCT 43.2 42.0 43.8 39.9  MCV 97.3  --  94.8 94.5  PLT 268  --  248 [id number]   Basic Metabolic Panel: Recent Labs  Lab 09/18/19 2256 09/18/19 2305 09/19/19 0319 09/20/19 0301  NA 135 133* 135 136  K 4.2 4.0 5.2* 3.7  CL 101 99 99 102  CO2 20*  --  22 24  GLUCOSE 174* 164* 133* 95  BUN 22 24* 22 11  CREATININE 1.47* 1.50* 1.24 0.87  CALCIUM 9.2  --  9.3 8.8*   GFR: Estimated Creatinine Clearance: 50.7 mL/min (by C-G formula based on SCr of 0.87 mg/dL). Liver Function Tests: Recent Labs  Lab 09/18/19 2256  AST 39  ALT 27  ALKPHOS 71  BILITOT 0.8  PROT 6.3*  ALBUMIN 3.8   No results for input(s): LIPASE, AMYLASE in the last  168 hours. No results for input(s): AMMONIA in the last 168 hours. Coagulation Profile: Recent Labs  Lab 09/18/19 2256  INR 1.1   Cardiac Enzymes: No results for input(s): CKTOTAL, CKMB, CKMBINDEX, TROPONINI in the last 168 hours. BNP (last 3 results) No results for input(s): PROBNP in the last 8760 hours. HbA1C: No results for input(s): HGBA1C in the last 72 hours. CBG: Recent Labs  Lab 09/20/19 0147  GLUCAP 84   Lipid Profile: No results for input(s): CHOL, HDL, LDLCALC, TRIG, CHOLHDL, LDLDIRECT in the last 72 hours. Thyroid Function Tests: No results for input(s): TSH, T4TOTAL, FREET4, T3FREE, THYROIDAB in the last 72 hours. Anemia Panel: No results for input(s): VITAMINB12, FOLATE, FERRITIN, TIBC, IRON, RETICCTPCT in the last 72 hours. Urine analysis:    Component Value Date/Time   COLORURINE YELLOW 09/19/2019 0148   APPEARANCEUR CLOUDY (A) 09/19/2019 0148   LABSPEC >1.046 (H) 09/19/2019 0148   PHURINE 5.0 09/19/2019 0148   GLUCOSEU NEGATIVE 09/19/2019 0148   HGBUR NEGATIVE 09/19/2019 0148   BILIRUBINUR NEGATIVE 09/19/2019  0148   KETONESUR NEGATIVE 09/19/2019 0148   PROTEINUR 30 (A) 09/19/2019 0148   UROBILINOGEN 0.2 08/17/2013 0105   NITRITE NEGATIVE 09/19/2019 0148   LEUKOCYTESUR LARGE (A) 09/19/2019 0148   Sepsis Labs: @LABRCNTIP (procalcitonin:4,lacticidven:4)  ) Recent Results (from the past 240 hour(s))  Culture, blood (routine x 2)     Status: None (Preliminary result)   Collection Time: 09/19/19  1:39 AM   Specimen: BLOOD RIGHT HAND  Result Value Ref Range Status   Specimen Description BLOOD RIGHT HAND  Final   Special Requests   Final    BOTTLES DRAWN AEROBIC AND ANAEROBIC Blood Culture results may not be optimal due to an inadequate volume of blood received in culture bottles   Culture   Final    NO GROWTH 1 DAY Performed at [facility], [place] [address], [place], [place] [address]    Report Status PENDING  Incomplete  Culture, blood  (routine x 2)     Status: None (Preliminary result)   Collection Time: 09/19/19  1:39 AM   Specimen: BLOOD LEFT HAND  Result Value Ref Range Status   Specimen Description BLOOD LEFT HAND  Final   Special Requests   Final    BOTTLES DRAWN AEROBIC ONLY Blood Culture results may not be optimal due to an inadequate volume of blood received in culture bottles   Culture   Final    NO GROWTH 1 DAY Performed at [facility], [place] [address] [place], [place] [address]    Report Status PENDING  Incomplete  Urine culture     Status: Abnormal (Preliminary result)   Collection Time: 09/19/19  1:48 AM   Specimen: Urine, Clean Catch  Result Value Ref Range Status   Specimen Description URINE, CLEAN CATCH  Final   Special Requests NONE  Final   Culture (A)  Final    >=100,000 COLONIES/mL GRAM NEGATIVE RODS CULTURE REINCUBATED FOR BETTER GROWTH Performed at [facility], [place] [address], [place], [place] [address]    Report Status PENDING  Incomplete  SARS CORONAVIRUS 2 (TAT 6-24 HRS) Nasopharyngeal Nasopharyngeal Swab     Status: None   Collection Time: 09/19/19  1:48 AM   Specimen: Nasopharyngeal Swab  Result Value Ref Range Status   SARS Coronavirus 2 NEGATIVE NEGATIVE Final    Comment: (NOTE) SARS-CoV-2 target nucleic acids are NOT DETECTED. The SARS-CoV-2 RNA is generally detectable in upper and lower respiratory specimens during the acute phase of infection. Negative results do not preclude SARS-CoV-2 infection, do not rule out co-infections with other pathogens, and should not be used as the sole basis for treatment or other patient management decisions. Negative results must be combined with clinical observations, patient history, and epidemiological information. The expected result is Negative. Fact Sheet for Patients: SugarRoll.be Fact Sheet for Healthcare Providers: https://www.woods-mathews.com/ This test is not yet approved or  cleared by the [place] FDA and  has been authorized for detection and/or diagnosis of SARS-CoV-2 by FDA under an Emergency Use Authorization (EUA). This EUA will remain  in effect (meaning this test can be used) for the duration of the COVID-19 declaration under Section 56 4(b)(1) of the Act, 21 U.S.C. section 360bbb-3(b)(1), unless the authorization is terminated or revoked sooner. Performed at [facility], [place] [address], [place], [place] [address]       Studies: No results found.  Scheduled Meds: . enoxaparin (LOVENOX) injection  40 mg Subcutaneous Q24H  . levETIRAcetam  500 mg  Oral BID    Continuous Infusions: . cefTRIAXone (ROCEPHIN)  IV 1 g (09/20/19 0025)     LOS: 1 day     Alma Friendly, MD [facility]  If 7PM-7AM, please contact night-coverage [email or web address] 09/20/2019, 3:36 PM

## 2019-09-21 LAB — CBC WITH DIFFERENTIAL/PLATELET
Abs Immature Granulocytes: 0.03 10*3/uL (ref 0.00–0.07)
Basophils Absolute: 0 10*3/uL (ref 0.0–0.1)
Basophils Relative: 0 %
Eosinophils Absolute: 0.3 10*3/uL (ref 0.0–0.5)
Eosinophils Relative: 3 %
HCT: 41.2 % (ref 39.0–52.0)
Hemoglobin: 14 g/dL (ref 13.0–17.0)
Immature Granulocytes: 0 %
Lymphocytes Relative: 23 %
Lymphs Abs: 2.3 10*3/uL (ref 0.7–4.0)
MCH: 32.8 pg (ref 26.0–34.0)
MCHC: 34 g/dL (ref 30.0–36.0)
MCV: 96.5 fL (ref 80.0–100.0)
Monocytes Absolute: 1.3 10*3/uL — ABNORMAL HIGH (ref 0.1–1.0)
Monocytes Relative: 13 %
Neutro Abs: 5.8 10*3/uL (ref 1.7–7.7)
Neutrophils Relative %: 61 %
Platelets: 196 10*3/uL (ref 150–400)
RBC: 4.27 MIL/uL (ref 4.22–5.81)
RDW: 12.2 % (ref 11.5–15.5)
WBC: 9.7 10*3/uL (ref 4.0–10.5)
nRBC: 0 % (ref 0.0–0.2)

## 2019-09-21 LAB — BASIC METABOLIC PANEL
Anion gap: 11 (ref 5–15)
BUN: 11 mg/dL (ref 8–23)
CO2: 25 mmol/L (ref 22–32)
Calcium: 9 mg/dL (ref 8.9–10.3)
Chloride: 101 mmol/L (ref 98–111)
Creatinine, Ser: 0.97 mg/dL (ref 0.61–1.24)
GFR calc Af Amer: >60 mL/min (ref >60)
GFR calc non Af Amer: >60 mL/min (ref >60)
Glucose, Bld: 117 mg/dL — ABNORMAL HIGH (ref 70–99)
Potassium: 3.7 mmol/L (ref 3.5–5.1)
Sodium: 137 mmol/L (ref 135–145)

## 2019-09-21 LAB — PROCALCITONIN: Procalcitonin: 0.3 ng/mL

## 2019-09-21 MED ORDER — AMLODIPINE BESYLATE 10 MG PO TABS
10.0000 mg | ORAL_TABLET | Freq: Every day | ORAL | Status: DC
  Administered 2019-09-21 – 2019-09-23 (×3): 10 mg via ORAL
  Filled 2019-09-21 (×3): qty 1

## 2019-09-21 NOTE — Progress Notes (Signed)
PROGRESS NOTE    [name] [name]  QRF:[id number] DOB: [birth year] DOA: 09/18/2019 PCP: [name], MD    Brief Narrative:  [name] [name] is a 86 y.o.malewith medical history significant ofamnesia, CAD, chronic diastolic congestive heart failure, hypertension, hyperlipidemia, BPH, history of falls, meningiomapresenting to the ED from his nursing home to be evaluated after an unwitnessed fall. Patient was found down at his nursing home after having fallen possibly from a chair.He was confused after the fall. Soon after he was placed on a bed, had an episode of convulsive activity involving the right side only which lasted approximately 30 seconds. Following this the patient was somnolent and unarousable. At baseline patient is AAO x4. EMS was called and code stroke activated upon arrival to the ED.   In the ED, head CT negative for acute intracranial abnormality. CTA head and neck negative for LVO. Neurology was consulted for work-up of seizures. Patient was given a Keppra load. Patient was also found to be in new onset A. fib, rate controlled. Heparin was started for anticoagulation. Labs showed WBC count 27.7 with left shift, Creatinine 1.4, baseline 0.9. UA with large amount of leukocytes, greater than 50 WBCs,and many bacteria.Urine culture pending. SARS-CoV-2 PCR negative. Blood culture x2 pending. Chest x-ray showing no active disease.  Patient admitted for further management.   Assessment & Plan:   Principal Problem:   Seizure ([place]) Active Problems:   Acute metabolic encephalopathy   New onset atrial fibrillation (HCC)   UTI (urinary tract infection)   AKI (acute kidney injury) ([place])   Possible partial seizure Patient presenting to ED from his nursing facility after unwitnessed fall.  Shortly after he was noted to have seizure-like activity lasting 30 seconds; following he was postictal.  CT head negative for acute intracranial abnormality.  CTA head and  neck negative for LVO.  MR brain with chronic vasogenic edema left temporal lobe correlating with middle cranial fossa meningioma.  EEG suggestive of mild diffuse encephalopathy.  Neurology was consulted and follow during hospital course.  No further seizure-like activity was appreciated so far during hospitalization. --Continue Keppra 500 mg p.o. twice daily --Ativan as needed --Seizure precautions  Sepsis likely 2/2 C Klebsiella oxytoca UTI; present on admission Noted to have leukocytosis, tachycardia, lactic acidosis on presentation.  Urinalysis with large leukoesterase, negative nitrite, many bacteria, greater than 50 WBCs.  Urine culture positive for Klebsiella oxytoca.  Procalcitonin 0.74. --Awaiting urine culture susceptibilities --Blood cultures x2: No growth x2 days --Continue ceftriaxone, awaiting susceptibilities as above  New onset A. Fib, paroxysmal Currently rate controlled. CHA2DS2-VASc, around 5.  Not a candidate for anticoagulation, elderly with increased fall risk with a seizure activity. Discussed extensively with daughter Yavier Snider on 09/19/2019 who understands his increased risk of bleeding versus CVA from A. Fib.  Daughter is in agreement not to start any anticoagulation. TTE with EF 65 to 82%, grade 1 diastolic dysfunction. --Continue monitor on telemetry  AKI/acute urinary retention Likely 2/2 poor oral intake/dehydration. UA with increased specific gravity.  Continue IV fluids In the ED, bladder scan showed 450 mils, in and out cath done As needed bladder scan/In-N-Out cath Daily BMP  Acute metabolic encephalopathy Likely multifactorial, possible recent seizure/post ictal state, possible UTI/sepsis, dehydration, possible underlying dementia Management as above  Chronic diastolic CHF, compensated No signs of volume overload, appears dry  Hypertension --Increase amlodipine to 10 mg p.o. daily --Lisinopril 40 mg p.o. daily  History of frequent  falls --Likely multifactorial --Fall precautions --PT/OT  GERD: Continue PPI   DVT prophylaxis: Lovenox Code Status: DNR Family Communication: None present at bedside Disposition Plan:      Patient is from: SNF     Anticipated Disposition: SNF     Barriers to discharge or conditions that needs to be met prior to discharge: Awaiting finalization of urine cultures  Consultants:   Neurology -signed off, recommend outpatient follow-up in 1-2 months  Procedures:  TTE: IMPRESSIONS  1. There are multiple cystic structures present in the liver. Dedicated  liver ultrasound is recommended if not already obtained.  2. Left ventricular ejection fraction, by estimation, is 65 to 70%. The  left ventricle has normal function. The left ventricle has no regional  wall motion abnormalities. There is mild concentric left ventricular  hypertrophy. Left ventricular diastolic  parameters are consistent with Grade I diastolic dysfunction (impaired  relaxation).  3. Right ventricular systolic function is normal. The right ventricular  size is mildly enlarged. Tricuspid regurgitation signal is inadequate for  assessing PA pressure.  4. The mitral valve is grossly normal. Mild mitral valve regurgitation.  No evidence of mitral stenosis.  5. The aortic valve is tricuspid. Aortic valve regurgitation is not  visualized. Mild to moderate aortic valve sclerosis/calcification is  present, without any evidence of aortic stenosis.  6. The inferior vena cava is normal in size with greater than 50%  respiratory variability, suggesting right atrial pressure of 3 mmHg.   EEG: IMPRESSION: This study is suggestive of mild diffuse encephalopathy, nonspecific etiology No seizures or epileptiform discharges were seen throughout the recording.  Antimicrobials:   Ceftriaxone 4/5>>   Subjective: Patient seen and examined at bedside, resting comfortably.  Pleasantly confused.  No specific complaints this  morning.  Does not remember having a seizure.  No other complaints at this time.  Awaiting urine cultures and sensitivities.  Denies headache, no fever, no chest pain, no abdominal pain.  No acute events overnight per nursing staff.  Objective: Vitals:   09/20/19 2352 09/21/19 0328 09/21/19 0729 09/21/19 1122  BP: (!) 172/60 (!) 172/54 (!) 180/57 138/63  Pulse: 63 60 60 63  Resp: 18 16 18 17   Temp: 98.5 F (36.9 C) 98.6 F (37 C) 98.1 F (36.7 C) 98.4 F (36.9 C)  TempSrc: Oral Oral Axillary Axillary  SpO2: (!) 89% 97% 96% 96%  Weight:      Height:        Intake/Output Summary (Last 24 hours) at 09/21/2019 1629 Last data filed at 09/21/2019 1448 Gross per 24 hour  Intake 440 ml  Output 2350 ml  Net -1910 ml   Filed Weights   09/18/19 2351 09/19/19 0423  Weight: 78.9 kg 76.4 kg    Examination:  General exam: Appears calm and comfortable, pleasantly confused Respiratory system: Clear to auscultation. Respiratory effort normal.  Oxygenating well on room air Cardiovascular system: S1 & S2 heard, RRR. No JVD, murmurs, rubs, gallops or clicks. No pedal edema. Gastrointestinal system: Abdomen is nondistended, soft and nontender. No organomegaly or masses felt. Normal bowel sounds heard. Central nervous system: Alert. No focal neurological deficits. Extremities: Symmetric 5 x 5 power. Skin: No rashes, lesions or ulcers Psychiatry: Judgement and insight appear poor. Mood & affect appropriate.     Data Reviewed: I have personally reviewed following labs and imaging studies  CBC: Recent Labs  Lab 09/18/19 2256 09/18/19 2305 09/19/19 0319 09/20/19 0301 09/21/19 0219  WBC 27.7*  --  25.8* 11.8* 9.7  NEUTROABS 24.2*  --   --  8.2* 5.8  HGB 14.6 14.3 15.1 13.8 14.0  HCT 43.2 42.0 43.8 39.9 41.2  MCV 97.3  --  94.8 94.5 96.5  PLT 268  --  248 189 397   Basic Metabolic Panel: Recent Labs  Lab 09/18/19 2256 09/18/19 2305 09/19/19 0319 09/20/19 0301 09/21/19 0219  NA 135  133* 135 136 137  K 4.2 4.0 5.2* 3.7 3.7  CL 101 99 99 102 101  CO2 20*  --  22 24 25   GLUCOSE 174* 164* 133* 95 117*  BUN 22 24* 22 11 11   CREATININE 1.47* 1.50* 1.24 0.87 0.97  CALCIUM 9.2  --  9.3 8.8* 9.0   GFR: Estimated Creatinine Clearance: 45.4 mL/min (by C-G formula based on SCr of 0.97 mg/dL). Liver Function Tests: Recent Labs  Lab 09/18/19 2256  AST 39  ALT 27  ALKPHOS 71  BILITOT 0.8  PROT 6.3*  ALBUMIN 3.8   No results for input(s): LIPASE, AMYLASE in the last 168 hours. No results for input(s): AMMONIA in the last 168 hours. Coagulation Profile: Recent Labs  Lab 09/18/19 2256  INR 1.1   Cardiac Enzymes: No results for input(s): CKTOTAL, CKMB, CKMBINDEX, TROPONINI in the last 168 hours. BNP (last 3 results) No results for input(s): PROBNP in the last 8760 hours. HbA1C: No results for input(s): HGBA1C in the last 72 hours. CBG: Recent Labs  Lab 09/20/19 0147  GLUCAP 84   Lipid Profile: No results for input(s): CHOL, HDL, LDLCALC, TRIG, CHOLHDL, LDLDIRECT in the last 72 hours. Thyroid Function Tests: No results for input(s): TSH, T4TOTAL, FREET4, T3FREE, THYROIDAB in the last 72 hours. Anemia Panel: No results for input(s): VITAMINB12, FOLATE, FERRITIN, TIBC, IRON, RETICCTPCT in the last 72 hours. Sepsis Labs: Recent Labs  Lab 09/19/19 0139 09/19/19 1521 09/19/19 1900 09/20/19 0301 09/21/19 0219  PROCALCITON  --  0.74  --  0.59 0.30  LATICACIDVEN 3.7* 1.2 1.1  --   --     Recent Results (from the past 240 hour(s))  Culture, blood (routine x 2)     Status: None (Preliminary result)   Collection Time: 09/19/19  1:39 AM   Specimen: BLOOD RIGHT HAND  Result Value Ref Range Status   Specimen Description BLOOD RIGHT HAND  Final   Special Requests   Final    BOTTLES DRAWN AEROBIC AND ANAEROBIC Blood Culture results may not be optimal due to an inadequate volume of blood received in culture bottles   Culture   Final    NO GROWTH 2  DAYS Performed at [facility], [place] [address] [place], [place] [address]    Report Status PENDING  Incomplete  Culture, blood (routine x 2)     Status: None (Preliminary result)   Collection Time: 09/19/19  1:39 AM   Specimen: BLOOD LEFT HAND  Result Value Ref Range Status   Specimen Description BLOOD LEFT HAND  Final   Special Requests   Final    BOTTLES DRAWN AEROBIC ONLY Blood Culture results may not be optimal due to an inadequate volume of blood received in culture bottles   Culture   Final    NO GROWTH 2 DAYS Performed at [facility], [place] [address], [place], [place] [address]    Report Status PENDING  Incomplete  Urine culture     Status: Abnormal (Preliminary result)   Collection Time: 09/19/19  1:48 AM   Specimen: Urine, Clean Catch  Result Value Ref Range Status   Specimen Description  URINE, CLEAN CATCH  Final   Special Requests NONE  Final   Culture (A)  Final    >=100,000 COLONIES/mL KLEBSIELLA OXYTOCA SUSCEPTIBILITIES TO FOLLOW Performed at [facility], [address], [place], [place] [address]    Report Status PENDING  Incomplete  SARS CORONAVIRUS 2 (TAT 6-24 HRS) Nasopharyngeal Nasopharyngeal Swab     Status: None   Collection Time: 09/19/19  1:48 AM   Specimen: Nasopharyngeal Swab  Result Value Ref Range Status   SARS Coronavirus 2 NEGATIVE NEGATIVE Final    Comment: (NOTE) SARS-CoV-2 target nucleic acids are NOT DETECTED. The SARS-CoV-2 RNA is generally detectable in upper and lower respiratory specimens during the acute phase of infection. Negative results do not preclude SARS-CoV-2 infection, do not rule out co-infections with other pathogens, and should not be used as the sole basis for treatment or other patient management decisions. Negative results must be combined with clinical observations, patient history, and epidemiological information. The expected result is Negative. Fact Sheet for  Patients: SugarRoll.be Fact Sheet for Healthcare Providers: https://www.woods-mathews.com/ This test is not yet approved or cleared by the [place] FDA and  has been authorized for detection and/or diagnosis of SARS-CoV-2 by FDA under an Emergency Use Authorization (EUA). This EUA will remain  in effect (meaning this test can be used) for the duration of the COVID-19 declaration under Section 56 4(b)(1) of the Act, 21 U.S.C. section 360bbb-3(b)(1), unless the authorization is terminated or revoked sooner. Performed at [facility], [place] [address] [place], [place] [address]          Radiology Studies: No results found.      Scheduled Meds: . amLODipine  10 mg Oral Daily  . enoxaparin (LOVENOX) injection  40 mg Subcutaneous Q24H  . gabapentin  200 mg Oral QHS  . levETIRAcetam  500 mg Oral BID  . lisinopril  40 mg Oral Daily  . methocarbamol  250 mg Oral Daily  . pantoprazole  40 mg Oral Daily  . traZODone  25 mg Oral QHS  . vitamin B-12  1,000 mcg Oral Daily  . [START ON 09/23/2019] zinc sulfate  220 mg Oral Once per day on Mon Fri   Continuous Infusions: . cefTRIAXone (ROCEPHIN)  IV Stopped (09/20/19 2330)     LOS: 2 days    Time spent: 35 minutes spent on chart review, discussion with nursing staff, consultants, updating family and interview/physical exam; more than 50% of that time was spent in counseling and/or coordination of care.    Nereyda Bowler J [place] DO [facility] Available via Epic secure chat 7am-7pm After these hours, please refer to coverage provider listed on [email or web address] 09/21/2019, 4:29 PM

## 2019-09-21 NOTE — NC FL2 (Signed)
[facility] LEVEL OF CARE SCREENING TOOL     IDENTIFICATION  Patient Name: [name] [name] Birthdate: 1925/01/05 Sex: male Admission Date (Current Location): 09/18/2019  [facility] and [place] Number:  Herbalist and Address:  [facility] [place], [place] [address], [place], [place] [address]      Provider Number: [id number]  Attending Physician Name and Address:  [place] Eric J, DO  Relative Name and Phone Number:  Korin Saladino    Current Level of Care: Hospital Recommended Level of Care: [place] Prior Approval Number:    Date Approved/Denied:   PASRR Number: [id number]  Discharge Plan: SNF    Current Diagnoses: Patient Active Problem List   Diagnosis Date Noted  . Seizure ([place]) 09/19/2019  . New onset atrial fibrillation ([place]) 09/19/2019  . UTI (urinary tract infection) 09/19/2019  . AKI (acute kidney injury) ([place]) 09/19/2019  . BPH (benign prostatic hyperplasia) 03/23/2019  . Gait abnormality 02/28/2019  . Fall 02/22/2019  . Anemia 02/03/2019  . Lower back pain 12/10/2018  . Right hip pain 11/26/2018  . Palliative care by specialist   . Acute metabolic encephalopathy [id number]  . Right leg weakness 11/03/2017  . Bleeding internal hemorrhoids 10/28/2017  . Chronic diastolic heart failure ([place]) 08/17/2013  . Femur fracture, left ([place]) 08/16/2013  . Bradycardia, severe sinus 10/30/2011  . Personal history of colonic adenoma 01/09/2011  . Family history of malignant neoplasm of colon cancer-mother 01/09/2011  . Coronary artery disease   . Hyperlipidemia   . Hypertension     Orientation RESPIRATION BLADDER Height & Weight     Self, Time, Situation, Place(confused at time)  Normal External catheter, Continent Weight: 168 lb 6.9 oz (76.4 kg) Height:  5\' 7"  (170.2 cm)  BEHAVIORAL SYMPTOMS/MOOD NEUROLOGICAL BOWEL NUTRITION STATUS      Continent Diet(please see discharge summary)  AMBULATORY STATUS  COMMUNICATION OF NEEDS Skin     Verbally Normal                       Personal Care Assistance Level of Assistance  Bathing, Feeding, Dressing Bathing Assistance: Limited assistance Feeding assistance: Independent Dressing Assistance: Limited assistance     Functional Limitations Info  Sight, Hearing, Speech Sight Info: Impaired Hearing Info: Impaired Speech Info: Impaired    SPECIAL CARE FACTORS FREQUENCY  PT (By licensed PT), OT (By licensed OT)     PT Frequency: 3x per week OT Frequency: 3x per week            Contractures Contractures Info: Not present    Additional Factors Info  Code Status, Allergies Code Status Info: DNR Allergies Info: NKA           Current Medications (09/21/2019):  This is the current hospital active medication list Current Facility-Administered Medications  Medication Dose Route Frequency Provider Last Rate Last Admin  . acetaminophen (TYLENOL) tablet 650 mg  650 mg Oral Q4H PRN Shela Leff, MD       Or  . acetaminophen (TYLENOL) suppository 650 mg  650 mg Rectal Q4H PRN Shela Leff, MD      . amLODipine (NORVASC) tablet 10 mg  10 mg Oral Daily [place] Donnamarie Poag, DO   10 mg at 09/21/19 0917  . cefTRIAXone (ROCEPHIN) 1 g in sodium chloride 0.9 % 100 mL IVPB  1 g Intravenous Q24H Shela Leff, MD   Stopped at 09/20/19 2330  . enoxaparin (LOVENOX) injection 40 mg  40 mg Subcutaneous Q24H Alma Friendly, MD   40 mg at 09/20/19 2101  . gabapentin (NEURONTIN) capsule 200 mg  200 mg Oral QHS Alma Friendly, MD   200 mg at 09/20/19 2101  . levETIRAcetam (KEPPRA) tablet 500 mg  500 mg Oral BID Shela Leff, MD   500 mg at 09/21/19 0918  . lisinopril (ZESTRIL) tablet 40 mg  40 mg Oral Daily Alma Friendly, MD   40 mg at 09/21/19 0917  . LORazepam (ATIVAN) injection 1-2 mg  1-2 mg Intravenous Q2H PRN Shela Leff, MD      . methocarbamol (ROBAXIN) tablet 250 mg  250 mg Oral Daily Alma Friendly, MD   250 mg at 09/21/19 0917  . pantoprazole (PROTONIX) EC tablet 40 mg  40 mg Oral Daily Alma Friendly, MD   40 mg at 09/21/19 [id number]  . traZODone (DESYREL) tablet 25 mg  25 mg Oral QHS Alma Friendly, MD   25 mg at 09/20/19 2133  . vitamin B-12 (CYANOCOBALAMIN) tablet 1,000 mcg  1,000 mcg Oral Daily Alma Friendly, MD   1,000 mcg at 09/21/19 [id number]  . [START ON 09/23/2019] zinc sulfate capsule 220 mg  220 mg Oral Once per day on Mon Fri [place], Adline Peals, MD         Discharge Medications: Please see discharge summary for a list of discharge medications.  Relevant Imaging Results:  Relevant Lab Results:   Additional Information SSN [place], [place]

## 2019-09-22 LAB — BASIC METABOLIC PANEL
Anion gap: 11 (ref 5–15)
BUN: 11 mg/dL (ref 8–23)
CO2: 23 mmol/L (ref 22–32)
Calcium: 8.8 mg/dL — ABNORMAL LOW (ref 8.9–10.3)
Chloride: 101 mmol/L (ref 98–111)
Creatinine, Ser: 0.92 mg/dL (ref 0.61–1.24)
GFR calc Af Amer: >60 mL/min (ref >60)
GFR calc non Af Amer: >60 mL/min (ref >60)
Glucose, Bld: 97 mg/dL (ref 70–99)
Potassium: 3.9 mmol/L (ref 3.5–5.1)
Sodium: 135 mmol/L (ref 135–145)

## 2019-09-22 LAB — CBC WITH DIFFERENTIAL/PLATELET
Abs Immature Granulocytes: 0.02 10*3/uL (ref 0.00–0.07)
Basophils Absolute: 0 10*3/uL (ref 0.0–0.1)
Basophils Relative: 0 %
Eosinophils Absolute: 0.4 10*3/uL (ref 0.0–0.5)
Eosinophils Relative: 4 %
HCT: 41.8 % (ref 39.0–52.0)
Hemoglobin: 14.1 g/dL (ref 13.0–17.0)
Immature Granulocytes: 0 %
Lymphocytes Relative: 21 %
Lymphs Abs: 2 10*3/uL (ref 0.7–4.0)
MCH: 32.3 pg (ref 26.0–34.0)
MCHC: 33.7 g/dL (ref 30.0–36.0)
MCV: 95.9 fL (ref 80.0–100.0)
Monocytes Absolute: 1.2 10*3/uL — ABNORMAL HIGH (ref 0.1–1.0)
Monocytes Relative: 13 %
Neutro Abs: 5.9 10*3/uL (ref 1.7–7.7)
Neutrophils Relative %: 62 %
Platelets: 208 10*3/uL (ref 150–400)
RBC: 4.36 MIL/uL (ref 4.22–5.81)
RDW: 12.2 % (ref 11.5–15.5)
WBC: 9.5 10*3/uL (ref 4.0–10.5)
nRBC: 0 % (ref 0.0–0.2)

## 2019-09-22 NOTE — Care Management Important Message (Signed)
Important Message  Patient Details  Name: [name] [name] MRN: [id number] Date of Birth: 1925/01/03   Medicare Important Message Given:  Yes     Shelda Altes 09/22/2019, 10:46 AM

## 2019-09-22 NOTE — Progress Notes (Signed)
Physical Therapy Treatment Patient Details Name: [name] [name] MRN: [id number] DOB: 08/11/1924 Today's Date: 09/22/2019    History of Present Illness 86 yo male admitted 4/4 for AMS, fall at ALF. Initial workup for CVA negative, workup now for seizures. PMH includes amnesia, CAD, chronic diastolic congestive heart failure, hypertension, hyperlipidemia, BPH, history of falls, meningioma.    PT Comments    Pt admitted with above diagnosis. Pt was able to ambulate with min guard to min assist with RW a short distance.  Pt progressing.  STill needs SNF to return to prior functional level.  Pt currently with functional limitations due to balance and endurance deficits. Pt will benefit from skilled PT to increase their independence and safety with mobility to allow discharge to the venue listed below.     Follow Up Recommendations  SNF;Supervision/Assistance - 24 hour     Equipment Recommendations  Other (comment)(defer to next venue)    Recommendations for Other Services       Precautions / Restrictions Precautions Precautions: Fall;Other (comment) Precaution Comments: Extremely HOH Restrictions Weight Bearing Restrictions: No    Mobility  Bed Mobility Overal bed mobility: Needs Assistance Bed Mobility: Supine to Sit;Sit to Supine Rolling: Mod assist Sidelying to sit: HOB elevated;Min assist;+2 for safety/equipment Supine to sit: Min assist;HOB elevated     General bed mobility comments: Pt able to initiate task and bring BLEs off bed. Assist for trunk.   Transfers Overall transfer level: Needs assistance Equipment used: Rolling walker (2 wheeled) Transfers: Sit to/from Stand Sit to Stand: Min assist;+2 safety/equipment;From elevated surface         General transfer comment: Pt able to stand with min assist to power up to [place].  Pt did well and was able to stand with flexed posture but min guard assist x 2. Moved pt to 3N1 and he tried but couldnt go.  Then moved him  to recliner. He stood from recliner to [place] with min assist as well.   Ambulation/Gait Ambulation/Gait assistance: Min assist;+2 safety/equipment Gait Distance (Feet): 6 Feet Assistive device: Rolling walker (2 wheeled) Gait Pattern/deviations: Step-through pattern;Decreased stride length;Trunk flexed;Wide base of support;Leaning posteriorly   Gait velocity interpretation: <1.31 ft/sec, indicative of household ambulator General Gait Details: Pt was able to ambulate with min guard to min assist 6 feet with RW.  Slightly unsteady at times and also backed back up to chair with min assist.     Stairs             Wheelchair Mobility    Modified Rankin (Stroke Patients Only)       Balance Overall balance assessment: Needs assistance Sitting-balance support: Feet supported;No upper extremity supported Sitting balance-Leahy Scale: Fair     Standing balance support: Bilateral upper extremity supported Standing balance-Leahy Scale: Poor Standing balance comment: Pt can stand with UE support and min guard to min assist. Trunk flexed.                             Cognition Arousal/Alertness: Awake/alert Behavior During Therapy: Restless Overall Cognitive Status: No family/caregiver present to determine baseline cognitive functioning Area of Impairment: Orientation;Attention;Memory;Following commands;Safety/judgement;Problem solving                 Orientation Level: Disoriented to;Place;Time;Situation Current Attention Level: Sustained Memory: Decreased short-term memory Following Commands: Follows one step commands inconsistently Safety/Judgement: Decreased awareness of safety;Decreased awareness of deficits   Problem Solving: Slow processing;Decreased initiation;Difficulty sequencing;Requires tactile cues;Requires  verbal cues General Comments: A&Ox 1. Pt appears confused, however difficulty to fully assess cognition as pt is extremely HOH. Pt is easily  distracted by personal conversation requiring mod to max redirection back to tasks.       Exercises General Exercises - Lower Extremity Ankle Circles/Pumps: AROM;Both;10 reps;Seated Long Arc Quad: AROM;Both;10 reps;Seated    General Comments General comments (skin integrity, edema, etc.): VSS      Pertinent Vitals/Pain Pain Assessment: No/denies pain Faces Pain Scale: Hurts little more Pain Location: low back Pain Descriptors / Indicators: Sore;Discomfort    Home Living                      Prior Function            PT Goals (current goals can now be found in the care plan section) Acute Rehab PT Goals Patient Stated Goal: Pt agreeable to working with therapy Progress towards PT goals: Progressing toward goals    Frequency    Min 2X/week      PT Plan Current plan remains appropriate    Co-evaluation              AM-PAC PT "6 Clicks" Mobility   Outcome Measure  Help needed turning from your back to your side while in a flat bed without using bedrails?: A Lot Help needed moving from lying on your back to sitting on the side of a flat bed without using bedrails?: A Lot Help needed moving to and from a bed to a chair (including a wheelchair)?: A Lot Help needed standing up from a chair using your arms (e.g., wheelchair or bedside chair)?: A Lot Help needed to walk in hospital room?: A Lot Help needed climbing 3-5 steps with a railing? : Total 6 Click Score: 11    End of Session Equipment Utilized During Treatment: Gait belt Activity Tolerance: Patient limited by fatigue;Patient tolerated treatment well Patient left: with call bell/phone within reach;in chair;with chair alarm set Nurse Communication: Mobility status PT Visit Diagnosis: Other abnormalities of gait and mobility (R26.89);Muscle weakness (generalized) (M62.81);History of falling (Z91.81)     Time: [id number] PT Time Calculation (min) (ACUTE ONLY): 24 min  Charges:  $Gait Training:  8-22 mins $Therapeutic Activity: 8-22 mins                     Magdaleno Lortie W,PT Acute Rehabilitation Services Pager:  [phone]  Office:  [place] 09/22/2019, 12:56 PM

## 2019-09-22 NOTE — Progress Notes (Signed)
PROGRESS NOTE    [name] [name]  [id number] DOB: Oct 23, 1924 DOA: 09/18/2019 PCP: [name], MD    Brief Narrative:  [name] [name] is a 86 y.o.malewith medical history significant ofamnesia, CAD, chronic diastolic congestive heart failure, hypertension, hyperlipidemia, BPH, history of falls, meningiomapresented to the ED from his nursing home to be evaluated after an unwitnessed fall. Patient was found down at his nursing home after having fallen possibly from a chair.He was confused after the fall. Soon after he was placed on a bed, had an episode of convulsive activity involving the right side only which lasted approximately 30 seconds. Following this the patient was somnolent and unarousable. At baseline patient is AAO x4. EMS was called and code stroke activated upon arrival to the ED.   In the ED, head CT negative for acute intracranial abnormality. CTA head and neck negative for LVO. Neurology was consulted for work-up of seizures. Patient was given a Keppra load. Patient was also found to be in new onset A. fib, rate controlled. Heparin was started for anticoagulation. Labs showed WBC count 27.7 with left shift, Creatinine 1.4, baseline 0.9. UA with large amount of leukocytes, greater than 50 WBCs,and many bacteria.Urine culture pending. SARS-CoV-2 PCR negative. Blood culture x2 pending. Chest x-ray showing no active disease.  Patient admitted for further management.   Assessment & Plan:  Possible partial seizure Patient presenting to ED from his nursing facility after unwitnessed fall.  Shortly after he was noted to have seizure-like activity lasting 30 seconds; following he was postictal.  CT head negative for acute intracranial abnormality.  CTA head and neck negative for LVO.  MR brain with chronic vasogenic edema left temporal lobe correlating with middle cranial fossa meningioma.  EEG suggestive of mild diffuse encephalopathy.  Neurology was consulted  and follow during hospital course.  No further seizure-like activity was appreciated so far during hospitalization. --Started on Keppra at this admission, remained stable, continue Keppra 500 mg p.o. twice daily --Follow-up with neurology in 1 month -Discharge planning  Sepsis likely 2/2 C Klebsiella oxytoca UTI; present on admission Noted to have leukocytosis, tachycardia, lactic acidosis on presentation.  Urinalysis with large leukoesterase, negative nitrite, many bacteria, greater than 50 WBCs.  Urine culture positive for Klebsiella oxytoca.  Procalcitonin 0.74. --Urine culture is still pending --Blood culture negative --Continue ceftriaxone, awaiting susceptibilities as above  New onset A. Fib, paroxysmal Currently rate controlled. CHA2DS2-VASc, around 5.  Not a candidate for anticoagulation, elderly with increased fall risk with a seizure activity.  -My partner discussed extensively with daughter Johncarlo Laxson on 09/19/2019 who understands his increased risk of bleeding versus CVA from A. Fib.  Daughter is in agreement not to start any anticoagulation. TTE with EF 65 to [id number], grade 1 diastolic dysfunction. --Continue monitor on telemetry  AKI/acute urinary retention Likely 2/2 poor oral intake/dehydration. UA with increased specific gravity.  -Hydrated with saline, kidney function has improved  Acute metabolic encephalopathy Likely multifactorial, possible recent seizure/post ictal state, possible UTI/sepsis, dehydration, possible underlying dementia Management as above  Chronic diastolic CHF, compensated No signs of volume overload, appears dry  Hypertension --Increase amlodipine to 10 mg p.o. daily --Lisinopril 40 mg p.o. daily  History of frequent falls --Likely multifactorial --Fall precautions --PT/OT  GERD: Continue PPI   DVT prophylaxis: Lovenox Code Status: DNR Family Communication: No family at bedside, discussed plan with patient today Disposition Plan:       Patient is from: SNF     Anticipated Disposition: SNF admitted  with sepsis, UTI and seizures, urine culture with Klebsiella, sensitivities are pending, discharge to SNF after sensitivities obtained  Consultants:   Neurology -signed off, recommend outpatient follow-up in 1-2 months  Procedures:  TTE: IMPRESSIONS  1. There are multiple cystic structures present in the liver. Dedicated  liver ultrasound is recommended if not already obtained.  2. Left ventricular ejection fraction, by estimation, is 65 to 70%. The  left ventricle has normal function. The left ventricle has no regional  wall motion abnormalities. There is mild concentric left ventricular  hypertrophy. Left ventricular diastolic  parameters are consistent with Grade I diastolic dysfunction (impaired  relaxation).  3. Right ventricular systolic function is normal. The right ventricular  size is mildly enlarged. Tricuspid regurgitation signal is inadequate for  assessing PA pressure.  4. The mitral valve is grossly normal. Mild mitral valve regurgitation.  No evidence of mitral stenosis.  5. The aortic valve is tricuspid. Aortic valve regurgitation is not  visualized. Mild to moderate aortic valve sclerosis/calcification is  present, without any evidence of aortic stenosis.  6. The inferior vena cava is normal in size with greater than 50%  respiratory variability, suggesting right atrial pressure of 3 mmHg.   EEG: IMPRESSION: This study is suggestive of mild diffuse encephalopathy, nonspecific etiology No seizures or epileptiform discharges were seen throughout the recording.  Antimicrobials:   Ceftriaxone 4/5>>   Subjective: -Feels okay, sitting comfortably in the recliner, mild memory deficits noted  Objective: Vitals:   09/22/19 0044 09/22/19 0448 09/22/19 0756 09/22/19 1141  BP: (!) 129/52 (!) 121/43 (!) 133/53 (!) 123/47  Pulse: 61 66 63 67  Resp: 18 (!) 21 18 18   Temp: 99.2 F (37.3 C) 98.3  F (36.8 C) 98.5 F (36.9 C) 97.8 F (36.6 C)  TempSrc: Oral Oral Oral Oral  SpO2: 95% 90% 98% 98%  Weight:      Height:        Intake/Output Summary (Last 24 hours) at 09/22/2019 1451 Last data filed at 09/22/2019 1300 Gross per 24 hour  Intake 580 ml  Output 650 ml  Net -70 ml   Filed Weights   09/18/19 2351 09/19/19 0423  Weight: 78.9 kg 76.4 kg    Examination:  Gen: Elderly male sitting up in bed, hard of hearing, AAO x2 HEENT: PERRLA, Neck supple, no JVD Lungs: Clear CVS: S1-S2, regular rate rhythm  abd: soft, Non tender, non distended, BS present Extremities: No edema Skin: no new rashes Psychiatry: Judgement and insight appear poor. Mood & affect appropriate.     Data Reviewed: I have personally reviewed following labs and imaging studies  CBC: Recent Labs  Lab 09/18/19 2256 09/18/19 2256 09/18/19 2305 09/19/19 0319 09/20/19 0301 09/21/19 0219 09/22/19 0333  WBC 27.7*  --   --  25.8* 11.8* 9.7 9.5  NEUTROABS 24.2*  --   --   --  8.2* 5.8 5.9  HGB 14.6   < > 14.3 15.1 13.8 14.0 14.1  HCT 43.2   < > 42.0 43.8 39.9 41.2 41.8  MCV 97.3  --   --  94.8 94.5 96.5 95.9  PLT 268  --   --  248 189 196 208   < > = values in this interval not displayed.   Basic Metabolic Panel: Recent Labs  Lab 09/18/19 2256 09/18/19 2256 09/18/19 2305 09/19/19 0319 09/20/19 0301 09/21/19 0219 09/22/19 0333  NA 135   < > 133* 135 136 137 135  K 4.2   < >  4.0 5.2* 3.7 3.7 3.9  CL 101   < > 99 99 102 101 101  CO2 20*  --   --  22 24 25 23   GLUCOSE 174*   < > 164* 133* 95 117* 97  BUN 22   < > 24* 22 11 11 11   CREATININE 1.47*   < > 1.50* 1.24 0.87 0.97 0.92  CALCIUM 9.2  --   --  9.3 8.8* 9.0 8.8*   < > = values in this interval not displayed.   GFR: Estimated Creatinine Clearance: 47.9 mL/min (by C-G formula based on SCr of 0.92 mg/dL). Liver Function Tests: Recent Labs  Lab 09/18/19 2256  AST 39  ALT 27  ALKPHOS 71  BILITOT 0.8  PROT 6.3*  ALBUMIN 3.8    No results for input(s): LIPASE, AMYLASE in the last 168 hours. No results for input(s): AMMONIA in the last 168 hours. Coagulation Profile: Recent Labs  Lab 09/18/19 2256  INR 1.1   Cardiac Enzymes: No results for input(s): CKTOTAL, CKMB, CKMBINDEX, TROPONINI in the last 168 hours. BNP (last 3 results) No results for input(s): PROBNP in the last 8760 hours. HbA1C: No results for input(s): HGBA1C in the last 72 hours. CBG: Recent Labs  Lab 09/20/19 0147  GLUCAP 84   Lipid Profile: No results for input(s): CHOL, HDL, LDLCALC, TRIG, CHOLHDL, LDLDIRECT in the last 72 hours. Thyroid Function Tests: No results for input(s): TSH, T4TOTAL, FREET4, T3FREE, THYROIDAB in the last 72 hours. Anemia Panel: No results for input(s): VITAMINB12, FOLATE, FERRITIN, TIBC, IRON, RETICCTPCT in the last 72 hours. Sepsis Labs: Recent Labs  Lab 09/19/19 0139 09/19/19 1521 09/19/19 1900 09/20/19 0301 09/21/19 0219  PROCALCITON  --  0.74  --  0.59 0.30  LATICACIDVEN 3.7* 1.2 1.1  --   --     Recent Results (from the past 240 hour(s))  Culture, blood (routine x 2)     Status: None (Preliminary result)   Collection Time: 09/19/19  1:39 AM   Specimen: BLOOD RIGHT HAND  Result Value Ref Range Status   Specimen Description BLOOD RIGHT HAND  Final   Special Requests   Final    BOTTLES DRAWN AEROBIC AND ANAEROBIC Blood Culture results may not be optimal due to an inadequate volume of blood received in culture bottles   Culture   Final    NO GROWTH 3 DAYS Performed at [facility], [place] [address], [place], [place] [address]    Report Status PENDING  Incomplete  Culture, blood (routine x 2)     Status: None (Preliminary result)   Collection Time: 09/19/19  1:39 AM   Specimen: BLOOD LEFT HAND  Result Value Ref Range Status   Specimen Description BLOOD LEFT HAND  Final   Special Requests   Final    BOTTLES DRAWN AEROBIC ONLY Blood Culture results may not be optimal due to an inadequate  volume of blood received in culture bottles   Culture   Final    NO GROWTH 3 DAYS Performed at [facility], [place] [address], [place], [place] [address]    Report Status PENDING  Incomplete  Urine culture     Status: Abnormal (Preliminary result)   Collection Time: 09/19/19  1:48 AM   Specimen: Urine, Clean Catch  Result Value Ref Range Status   Specimen Description URINE, CLEAN CATCH  Final   Special Requests NONE  Final   Culture (A)  Final    >=100,000 COLONIES/mL  KLEBSIELLA OXYTOCA SUSCEPTIBILITIES TO FOLLOW Performed at [facility], [place] [address], [place], [place] [address]    Report Status PENDING  Incomplete  SARS CORONAVIRUS 2 (TAT 6-24 HRS) Nasopharyngeal Nasopharyngeal Swab     Status: None   Collection Time: 09/19/19  1:48 AM   Specimen: Nasopharyngeal Swab  Result Value Ref Range Status   SARS Coronavirus 2 NEGATIVE NEGATIVE Final    Comment: (NOTE) SARS-CoV-2 target nucleic acids are NOT DETECTED. The SARS-CoV-2 RNA is generally detectable in upper and lower respiratory specimens during the acute phase of infection. Negative results do not preclude SARS-CoV-2 infection, do not rule out co-infections with other pathogens, and should not be used as the sole basis for treatment or other patient management decisions. Negative results must be combined with clinical observations, patient history, and epidemiological information. The expected result is Negative. Fact Sheet for Patients: SugarRoll.be Fact Sheet for Healthcare Providers: https://www.woods-mathews.com/ This test is not yet approved or cleared by the [place] FDA and  has been authorized for detection and/or diagnosis of SARS-CoV-2 by FDA under an Emergency Use Authorization (EUA). This EUA will remain  in effect (meaning this test can be used) for the duration of the COVID-19 declaration under Section 56 4(b)(1) of the Act, 21 U.S.C. section  360bbb-3(b)(1), unless the authorization is terminated or revoked sooner. Performed at [facility], [place] [address] [place], [place] [address]          Radiology Studies: No results found.      Scheduled Meds: . amLODipine  10 mg Oral Daily  . enoxaparin (LOVENOX) injection  40 mg Subcutaneous Q24H  . gabapentin  200 mg Oral QHS  . levETIRAcetam  500 mg Oral BID  . lisinopril  40 mg Oral Daily  . methocarbamol  250 mg Oral Daily  . pantoprazole  40 mg Oral Daily  . traZODone  25 mg Oral QHS  . vitamin B-12  1,000 mcg Oral Daily  . [START ON 09/23/2019] zinc sulfate  220 mg Oral Once per day on Mon Fri   Continuous Infusions: . cefTRIAXone (ROCEPHIN)  IV Stopped (09/21/19 2348)     LOS: 3 days    Time spent: 35 minutes   Domenic Polite, MD [facility]  09/22/2019, 2:51 PM

## 2019-09-23 DIAGNOSIS — R402411 Glasgow coma scale score 13-15, in the field [EMT or ambulance]: Secondary | ICD-10-CM | POA: Diagnosis not present

## 2019-09-23 DIAGNOSIS — N3000 Acute cystitis without hematuria: Secondary | ICD-10-CM | POA: Diagnosis not present

## 2019-09-23 DIAGNOSIS — I10 Essential (primary) hypertension: Secondary | ICD-10-CM | POA: Diagnosis not present

## 2019-09-23 DIAGNOSIS — R7611 Nonspecific reaction to tuberculin skin test without active tuberculosis: Secondary | ICD-10-CM | POA: Diagnosis not present

## 2019-09-23 DIAGNOSIS — R2681 Unsteadiness on feet: Secondary | ICD-10-CM | POA: Diagnosis not present

## 2019-09-23 DIAGNOSIS — N401 Enlarged prostate with lower urinary tract symptoms: Secondary | ICD-10-CM | POA: Diagnosis not present

## 2019-09-23 DIAGNOSIS — I2510 Atherosclerotic heart disease of native coronary artery without angina pectoris: Secondary | ICD-10-CM | POA: Diagnosis not present

## 2019-09-23 DIAGNOSIS — R569 Unspecified convulsions: Secondary | ICD-10-CM | POA: Diagnosis not present

## 2019-09-23 DIAGNOSIS — I959 Hypotension, unspecified: Secondary | ICD-10-CM | POA: Diagnosis not present

## 2019-09-23 DIAGNOSIS — R29898 Other symptoms and signs involving the musculoskeletal system: Secondary | ICD-10-CM | POA: Diagnosis not present

## 2019-09-23 DIAGNOSIS — D329 Benign neoplasm of meninges, unspecified: Secondary | ICD-10-CM | POA: Diagnosis not present

## 2019-09-23 DIAGNOSIS — R279 Unspecified lack of coordination: Secondary | ICD-10-CM | POA: Diagnosis not present

## 2019-09-23 DIAGNOSIS — N179 Acute kidney failure, unspecified: Secondary | ICD-10-CM | POA: Diagnosis not present

## 2019-09-23 DIAGNOSIS — M545 Low back pain: Secondary | ICD-10-CM | POA: Diagnosis not present

## 2019-09-23 DIAGNOSIS — E785 Hyperlipidemia, unspecified: Secondary | ICD-10-CM | POA: Diagnosis not present

## 2019-09-23 DIAGNOSIS — I4891 Unspecified atrial fibrillation: Secondary | ICD-10-CM | POA: Diagnosis not present

## 2019-09-23 DIAGNOSIS — K219 Gastro-esophageal reflux disease without esophagitis: Secondary | ICD-10-CM | POA: Diagnosis not present

## 2019-09-23 DIAGNOSIS — I5032 Chronic diastolic (congestive) heart failure: Secondary | ICD-10-CM | POA: Diagnosis not present

## 2019-09-23 DIAGNOSIS — G9341 Metabolic encephalopathy: Secondary | ICD-10-CM | POA: Diagnosis not present

## 2019-09-23 DIAGNOSIS — A419 Sepsis, unspecified organism: Secondary | ICD-10-CM | POA: Diagnosis not present

## 2019-09-23 DIAGNOSIS — Z9181 History of falling: Secondary | ICD-10-CM | POA: Diagnosis not present

## 2019-09-23 DIAGNOSIS — D519 Vitamin B12 deficiency anemia, unspecified: Secondary | ICD-10-CM | POA: Diagnosis not present

## 2019-09-23 DIAGNOSIS — I2583 Coronary atherosclerosis due to lipid rich plaque: Secondary | ICD-10-CM | POA: Diagnosis not present

## 2019-09-23 DIAGNOSIS — M6281 Muscle weakness (generalized): Secondary | ICD-10-CM | POA: Diagnosis not present

## 2019-09-23 DIAGNOSIS — Z743 Need for continuous supervision: Secondary | ICD-10-CM | POA: Diagnosis not present

## 2019-09-23 DIAGNOSIS — N390 Urinary tract infection, site not specified: Secondary | ICD-10-CM | POA: Diagnosis not present

## 2019-09-23 LAB — CBC WITH DIFFERENTIAL/PLATELET
Abs Immature Granulocytes: 0.03 10*3/uL (ref 0.00–0.07)
Basophils Absolute: 0 10*3/uL (ref 0.0–0.1)
Basophils Relative: 1 %
Eosinophils Absolute: 0.4 10*3/uL (ref 0.0–0.5)
Eosinophils Relative: 5 %
HCT: 46.2 % (ref 39.0–52.0)
Hemoglobin: 15.5 g/dL (ref 13.0–17.0)
Immature Granulocytes: 0 %
Lymphocytes Relative: 18 %
Lymphs Abs: 1.6 10*3/uL (ref 0.7–4.0)
MCH: 32.4 pg (ref 26.0–34.0)
MCHC: 33.5 g/dL (ref 30.0–36.0)
MCV: 96.7 fL (ref 80.0–100.0)
Monocytes Absolute: 0.9 10*3/uL (ref 0.1–1.0)
Monocytes Relative: 11 %
Neutro Abs: 5.8 10*3/uL (ref 1.7–7.7)
Neutrophils Relative %: 65 %
Platelets: 260 10*3/uL (ref 150–400)
RBC: 4.78 MIL/uL (ref 4.22–5.81)
RDW: 12.2 % (ref 11.5–15.5)
WBC: 8.8 10*3/uL (ref 4.0–10.5)
nRBC: 0 % (ref 0.0–0.2)

## 2019-09-23 LAB — BASIC METABOLIC PANEL
Anion gap: 11 (ref 5–15)
BUN: 18 mg/dL (ref 8–23)
CO2: 25 mmol/L (ref 22–32)
Calcium: 9 mg/dL (ref 8.9–10.3)
Chloride: 101 mmol/L (ref 98–111)
Creatinine, Ser: 1.03 mg/dL (ref 0.61–1.24)
GFR calc Af Amer: >60 mL/min (ref >60)
GFR calc non Af Amer: >60 mL/min (ref >60)
Glucose, Bld: 107 mg/dL — ABNORMAL HIGH (ref 70–99)
Potassium: 4.3 mmol/L (ref 3.5–5.1)
Sodium: 137 mmol/L (ref 135–145)

## 2019-09-23 LAB — URINE CULTURE: Culture: >=100000 — AB

## 2019-09-23 MED ORDER — CEPHALEXIN 250 MG PO CAPS: 250.0000 mg | ORAL_CAPSULE | Freq: Four times a day (QID) | ORAL | 0 refills | Status: AC

## 2019-09-23 MED ORDER — LEVETIRACETAM 500 MG PO TABS: 500.0000 mg | ORAL_TABLET | Freq: Two times a day (BID) | ORAL | Status: DC

## 2019-09-23 NOTE — Discharge Summary (Signed)
Physician Discharge Summary  [name] [name] [id number] DOB: 03/27/1925 DOA: 09/18/2019  PCP: [name], MD  Admit date: 09/18/2019 Discharge date: 09/23/2019  Time spent: 45 minutes  Recommendations for Outpatient Follow-up:  PCP in 1 week Neurology in 1 month, seizure instructions regarding no driving or operating machinery or indulging in underwater activities for 6 months or until cleared by neurology at follow-up  Discharge Diagnoses:  Principal Problem:   Seizure ([place]) Sepsis Klebsiella UTI Acute urinary retention Acute kidney injury Chronic diastolic CHF Hypertension   Acute metabolic encephalopathy   New onset atrial fibrillation ([place])   UTI (urinary tract infection)   AKI (acute kidney injury) ([place])   Discharge Condition: Stable  Diet recommendation: Low-sodium, heart healthy  Filed Weights   09/18/19 2351 09/19/19 0423  Weight: 78.9 kg 76.4 kg    History of present illness:  [name] [name] is a 86 y.o.malewith medical history significant ofamnesia, CAD, chronic diastolic congestive heart failure, hypertension, hyperlipidemia, BPH, history of falls, meningiomapresented to the ED from his nursing home to be evaluated after an unwitnessed fall. Patient was found down at his nursing home after having fallenpossiblyfrom a chair.He was confused after the fall. Soon after he was placed on a bed, had an episode ofconvulsive activity involving the right side only which lasted approximately 30 seconds. Following this the patient was somnolent and unarousable. At baseline patient is AAO x4. EMS was called and code stroke activated upon arrival to the ED. In the ED, head CT negative for acute intracranial abnormality. CTA head and neck negative for LVO.  -Labs showedWBC count 27.7 with left shift,Creatinine 1.4, baseline 0.9. UA with large amount of leukocytes, greater than 50 WBCs,and many bacteria.SARS-CoV-2 PCRnegative.Blood culture x2  pending. Chest x-ray showing no active disease.Patient admitted for further management  Hospital Course:   Partial seizures  Patient presented to ED from his nursing facility after unwitnessed fall.  Shortly after he was noted to have seizure-like activity lasting 30 seconds; following he was postictal.  CT head negative for acute intracranial abnormality.  CTA head and neck negative for LVO.  MR brain with chronic vasogenic edema left temporal lobe correlating with middle cranial fossa meningioma.  EEG suggestive of mild diffuse encephalopathy.  Neurology was consulted and follow during hospital course.  No further seizure-like activity was appreciated so far during hospitalization. --Started on Keppra at this admission, remained stable, continue Keppra 500 mg p.o. twice daily --Follow-up with neurology in 1 month  Sepsis likely 2/2 C Klebsiella oxytoca UTI; present on admission Noted to have leukocytosis,tachycardia, lactic acidosis on presentation.  Urinalysis with large leukoesterase, negative nitrite, many bacteria, greater than 50 WBCs.  Urine culture positive for Klebsiella oxytoca.  Procalcitonin 0.74. --Urine culture with Klebsiella --Blood culture negative --Clinically improved on ceftriaxone, transition to oral Keflex for 2 more days  New onset A. Fib, paroxysmal Currently rate controlled. CHA2DS2-VASc, around 5.  Not a candidate for anticoagulation, elderly with increased fall risk with a seizure activity.  -My partner Dr. Horris Latino discussed extensively with daughter [name] [name] on 09/19/2019 who understands his increased risk of bleeding versus risk of CVA from A. Fib.Daughter is in agreement not to start any anticoagulation. TTE with EF 65 to [id number], grade 1 diastolic dysfunction.  AKI/acute urinary retention Likely 2/2 poor oral intake/dehydration. UA with increased specific gravity.  -Hydrated with saline, kidney function has improved  Acute metabolic  encephalopathy Likely multifactorial,possible recent seizure/post ictal state, possible UTI/sepsis,dehydration,possible underlying dementia -Improved and stable now  Chronic diastolic CHF, compensated No signs of volume overload, appears dry  Hypertension --Increase amlodipine to 10 mg p.o. daily --Lisinopril 40 mg p.o. daily  History of frequent falls --Likely multifactorial --Fall precautions --PT/OT  GERD: Continue PPI   CODE STATUS DNR  Consultants:   Neurology -signed off, recommend outpatient follow-up in 1-2 months  Procedures:  TTE: IMPRESSIONS  1. There are multiple cystic structures present in the liver. Dedicated  liver ultrasound is recommended if not already obtained.  2. Left ventricular ejection fraction, by estimation, is 65 to 70%. The  left ventricle has normal function. The left ventricle has no regional  wall motion abnormalities. There is mild concentric left ventricular  hypertrophy. Left ventricular diastolic  parameters are consistent with Grade I diastolic dysfunction (impaired  relaxation).  3. Right ventricular systolic function is normal. The right ventricular  size is mildly enlarged. Tricuspid regurgitation signal is inadequate for  assessing PA pressure.  4. The mitral valve is grossly normal. Mild mitral valve regurgitation.  No evidence of mitral stenosis.  5. The aortic valve is tricuspid. Aortic valve regurgitation is not  visualized. Mild to moderate aortic valve sclerosis/calcification is  present, without any evidence of aortic stenosis.  6. The inferior vena cava is normal in size with greater than 50%  respiratory variability, suggesting right atrial pressure of 3 mmHg.   EEG: IMPRESSION: This study issuggestive of mild diffuse encephalopathy, nonspecific etiology No seizures or epileptiform discharges were seen throughout the recording.   Discharge Exam: Vitals:   09/23/19 1130 09/23/19 1132  BP: (!)  161/68 (!) 161/68  Pulse: 70   Resp: 16   Temp:    SpO2: 97%     General: Awake alert oriented x3 Cardiovascular: S2, regular rate rhythm Respiratory: Clear  Discharge Instructions   Discharge Instructions    Diet - low sodium heart healthy   Complete by: As directed    Increase activity slowly   Complete by: As directed      Allergies as of 09/23/2019   No Known Allergies     Medication List    STOP taking these medications   potassium chloride SA 20 MEQ tablet Commonly known as: KLOR-CON     TAKE these medications   acetaminophen 500 MG tablet Commonly known as: TYLENOL Take 500 mg by mouth every 8 (eight) hours as needed for mild pain or headache. What changed: Another medication with the same name was removed. Continue taking this medication, and follow the directions you see here.   amLODipine 5 MG tablet Commonly known as: NORVASC Take 5 mg by mouth daily.   Anusol-HC 2.5 % rectal cream Generic drug: hydrocortisone Place 1 application rectally daily as needed for hemorrhoids or anal itching (USE PERINEAL APPLICATOR). With perineal applicator   aspirin EC 81 MG tablet Take 81 mg by mouth daily.   cephALEXin 250 MG capsule Commonly known as: KEFLEX Take 1 capsule (250 mg total) by mouth 4 (four) times daily for 2 days.   Deep Sea Nasal Spray 0.65 % nasal spray Generic drug: sodium chloride Place 1 spray into the nose daily as needed for congestion (BOTH NOSTRILS).   DEXTRAN 70-HYPROMELLOSE OP Place 1 drop into both eyes daily as needed (for dryness).   furosemide 40 MG tablet Commonly known as: LASIX Take 40 mg by mouth daily.   gabapentin 100 MG capsule Commonly known as: NEURONTIN Take 200 mg by mouth at bedtime.   levETIRAcetam 500 MG tablet Commonly known as: KEPPRA Take  1 tablet (500 mg total) by mouth 2 (two) times daily.   lisinopril 40 MG tablet Commonly known as: ZESTRIL Take 40 mg by mouth daily.   omeprazole 20 MG  capsule Commonly known as: PRILOSEC Take 20 mg by mouth daily.   Robaxin 500 MG tablet Generic drug: methocarbamol Take 250 mg by mouth See admin instructions. Take 250 mg by mouth in the morning and an additional 250 mg once a day as needed for muscle spasms   rosuvastatin 5 MG tablet Commonly known as: CRESTOR Take 5 mg by mouth daily.   Thera-M Tabs Take 1 tablet by mouth daily.   traZODone 50 MG tablet Commonly known as: DESYREL Take 0.5 tablets (25 mg total) by mouth at bedtime.   vitamin B-12 1000 MCG tablet Commonly known as: CYANOCOBALAMIN Take 1,000 mcg by mouth daily.   Vitamin D-3 25 MCG (1000 UT) Caps Take 1,000 Units by mouth daily.   Zinc 50 MG Tabs Take 100 mg by mouth See admin instructions. Take 100 mg by mouth two times a week- on Monday(s) and Friday(s) only      No Known Allergies Follow-up Information    [name], MD. Schedule an appointment as soon as possible for a visit in 1 week(s).   Specialty: Internal Medicine Contact information: [place] [id number] [phone]        Neurology. Schedule an appointment as soon as possible for a visit in 1 month(s).            The results of significant diagnostics from this hospitalization (including imaging, microbiology, ancillary and laboratory) are listed below for reference.    Significant Diagnostic Studies: EEG  Result Date: 09/19/2019 Lora Havens, MD     09/19/2019 12:28 PM Patient Name: TADEN STILE MRN: [id number] Epilepsy Attending: Lora Havens Referring Physician/Provider: Dr. Shela Leff Date: 09/19/2019 Duration: 22.11 minutes Patient history: 86 year old male with history of left temporal meningioma presented with new onset seizures.  EEG evaluate for seizures. Level of alertness: Awake AEDs during EEG study: Keppra Technical aspects: This EEG study was done with scalp electrodes positioned according to the 10-20 International system of  electrode placement. Electrical activity was acquired at a sampling rate of 500Hz  and reviewed with a high frequency filter of 70Hz  and a low frequency filter of 1Hz . EEG data were recorded continuously and digitally stored. Description: During awake state, no clear posterior dominant was seen.  EEG showed intermittent generalized 3 to 5 Hz theta-delta slowing as well as generalized 9 to 10 Hz polymorphic alpha activity. Hyperventilation and photic stimulation were not performed. Abnormality -Intermittent slow, generalized IMPRESSION: This study is suggestive of mild diffuse encephalopathy, nonspecific etiology No seizures or epileptiform discharges were seen throughout the recording. Lora Havens   CT Code Stroke CTA Head W/WO contrast  Result Date: 09/18/2019 CLINICAL DATA:  Seizure and altered mental status EXAM: CT ANGIOGRAPHY HEAD AND NECK TECHNIQUE: Multidetector CT imaging of the head and neck was performed using the standard protocol during bolus administration of intravenous contrast. Multiplanar CT image reconstructions and MIPs were obtained to evaluate the vascular anatomy. Carotid stenosis measurements (when applicable) are obtained utilizing NASCET criteria, using the distal internal carotid diameter as the denominator. CONTRAST:  60mL OMNIPAQUE IOHEXOL 350 MG/ML SOLN COMPARISON:  Head CT 09/18/2019 FINDINGS: The examination is markedly degraded by motion. CTA NECK FINDINGS SKELETON: There is no bony spinal canal stenosis. No lytic or blastic lesion. OTHER NECK: Normal pharynx,  larynx and major salivary glands. No cervical lymphadenopathy. Unremarkable thyroid gland. UPPER CHEST: No pneumothorax or pleural effusion. No nodules or masses. AORTIC ARCH: There is mild calcific atherosclerosis of the aortic arch. There is no aneurysm, dissection or hemodynamically significant stenosis of the visualized portion of the aorta. Conventional 3 vessel aortic branching pattern. The visualized proximal  subclavian arteries are widely patent. RIGHT CAROTID SYSTEM: No dissection, occlusion or aneurysm. Mild atherosclerotic calcification at the carotid bifurcation without hemodynamically significant stenosis. LEFT CAROTID SYSTEM: No dissection, occlusion or aneurysm. Mild atherosclerotic calcification at the carotid bifurcation without hemodynamically significant stenosis. VERTEBRAL ARTERIES: Left dominant configuration. Both origins are clearly patent. There is no dissection, occlusion or flow-limiting stenosis to the skull base (V1-V3 segments). CTA HEAD FINDINGS POSTERIOR CIRCULATION: --Vertebral arteries: Normal V4 segments. --Posterior inferior cerebellar arteries (PICA): Patent origins from the vertebral arteries. --Anterior inferior cerebellar arteries (AICA): Patent origins from the basilar artery. --Basilar artery: Normal. --Superior cerebellar arteries: Normal. --Posterior cerebral arteries: Normal. The right PCA is partially supplied by a posterior communicating artery (p-comm). ANTERIOR CIRCULATION: --Intracranial internal carotid arteries: Normal. --Anterior cerebral arteries (ACA): Normal. Both A1 segments are present. Patent anterior communicating artery (a-comm). --Middle cerebral arteries (MCA): Normal. VENOUS SINUSES: As permitted by contrast timing, patent. ANATOMIC VARIANTS: None Review of the MIP images confirms the above findings. IMPRESSION: 1. Motion degraded study. Within that limitation, no large vessel occlusion or hemodynamically significant stenosis of the head or neck. 2.  Aortic atherosclerosis (ICD10-I70.0). Electronically Signed   By: Ulyses Jarred M.D.   On: 09/18/2019 23:41   DG Abd 1 View  Result Date: 09/19/2019 CLINICAL DATA:  MRI. Question metal. EXAM: ABDOMEN - 1 VIEW COMPARISON:  None. FINDINGS: Stomach is moderately distended. Bowel gas pattern is otherwise normal. Lung bases are clear. The heart is enlarged. Left hip hemiarthroplasty is present. No other metallic implants  are present. Contrast is present within the urinary bladder. IMPRESSION: 1. Left hip hemiarthroplasty. 2. No other metallic implants. 3. Moderate gaseous distention of the stomach. 4. Cardiomegaly without failure. Electronically Signed   By: San Morelle M.D.   On: 09/19/2019 07:50   MR BRAIN WO CONTRAST  Result Date: 09/19/2019 CLINICAL DATA:  Altered mental status with unclear cause. EXAM: MRI HEAD WITHOUT CONTRAST TECHNIQUE: Multiplanar, multiecho pulse sequences of the brain and surrounding structures were obtained without intravenous contrast. COMPARISON:  CTA head neck from yesterday brain MRI report 10/17/2015, images not available FINDINGS: Brain: No acute infarction, hemorrhage, hydrocephalus, or shift. Moderate edema in the anterior left temporal lobe white matter which has been seen on multiple prior scans and correlates with a 2017 brain MRI report showing a meningioma in the left middle cranial fossa. A peritumoral cyst may have developed since prior. Vascular: Normal flow voids Skull and upper cervical spine: Unremarkable marrow signal. Sinuses/Orbits: Negative. Other: Motion degraded and truncated study. T1 and coronal T2 imaging was not acquired. IMPRESSION: 1. Chronic vasogenic edema in the left temporal lobe correlating with a middle cranial fossa meningioma reported in 2017. The previously seen enhancing mass is not visible on this motion degraded and noncontrast study which was truncated. 2. Background senescent changes. Electronically Signed   By: Monte Fantasia M.D.   On: 09/19/2019 07:22   DG Chest Port 1 View  Result Date: 09/19/2019 CLINICAL DATA:  Altered mental status, status post fall. EXAM: PORTABLE CHEST 1 VIEW COMPARISON:  Nov 01, 2018 FINDINGS: There is no evidence of acute infiltrate, pleural effusion or pneumothorax. The heart size  and mediastinal contours are within normal limits. There is moderate severity calcification of the aortic arch. The visualized skeletal  structures are unremarkable. IMPRESSION: No active disease. Electronically Signed   By: Virgina Norfolk M.D.   On: 09/19/2019 02:18   ECHOCARDIOGRAM COMPLETE  Result Date: 09/19/2019    ECHOCARDIOGRAM REPORT   Patient Name:   AYVEN CAHAN Date of Exam: 09/19/2019 Medical Rec #:  [id number]          Height:       67.0 in Accession #:    [id number]         Weight:       168.4 lb Date of Birth:  24-Apr-1925         BSA:          1.880 m Patient Age:    64 years           BP:           158/52 mmHg Patient Gender: M                  HR:           59 bpm. Exam Location:  Inpatient Procedure: 2D Echo, Cardiac Doppler and Color Doppler Indications:    I48.0 Paroxysmal atrial fibrillation  History:        Patient has prior history of Echocardiogram examinations, most                 recent 11/03/2018. CHF, CAD; Risk Factors:Hypertension,                 Dyslipidemia and GERD.  Sonographer:    Jonelle Sidle Dance Referring Phys: [id number] [place]  1. There are multiple cystic structures present in the liver. Dedicated liver ultrasound is recommended if not already obtained.  2. Left ventricular ejection fraction, by estimation, is 65 to 70%. The left ventricle has normal function. The left ventricle has no regional wall motion abnormalities. There is mild concentric left ventricular hypertrophy. Left ventricular diastolic parameters are consistent with Grade I diastolic dysfunction (impaired relaxation).  3. Right ventricular systolic function is normal. The right ventricular size is mildly enlarged. Tricuspid regurgitation signal is inadequate for assessing PA pressure.  4. The mitral valve is grossly normal. Mild mitral valve regurgitation. No evidence of mitral stenosis.  5. The aortic valve is tricuspid. Aortic valve regurgitation is not visualized. Mild to moderate aortic valve sclerosis/calcification is present, without any evidence of aortic stenosis.  6. The inferior vena cava is normal in size  with greater than 50% respiratory variability, suggesting right atrial pressure of 3 mmHg. Comparison(s): A prior study was performed on 11/03/2018. No significant change from prior study. FINDINGS  Left Ventricle: Left ventricular ejection fraction, by estimation, is 65 to 70%. The left ventricle has normal function. The left ventricle has no regional wall motion abnormalities. The left ventricular internal cavity size was normal in size. There is  mild concentric left ventricular hypertrophy. Left ventricular diastolic parameters are consistent with Grade I diastolic dysfunction (impaired relaxation). Right Ventricle: The right ventricular size is mildly enlarged. No increase in right ventricular wall thickness. Right ventricular systolic function is normal. Tricuspid regurgitation signal is inadequate for assessing PA pressure. Left Atrium: Left atrial size was normal in size. Right Atrium: Right atrial size was normal in size. Pericardium: There is no evidence of pericardial effusion. Presence of pericardial fat pad. Mitral Valve: The mitral valve is grossly normal. Mild mitral valve regurgitation. No  evidence of mitral valve stenosis. Tricuspid Valve: The tricuspid valve is grossly normal. Tricuspid valve regurgitation is not demonstrated. No evidence of tricuspid stenosis. Aortic Valve: The aortic valve is tricuspid. Aortic valve regurgitation is not visualized. Mild to moderate aortic valve sclerosis/calcification is present, without any evidence of aortic stenosis. Aortic valve mean gradient measures 7.6 mmHg. Aortic valve peak gradient measures 13.6 mmHg. Aortic valve area, by VTI measures 1.93 cm. Pulmonic Valve: The pulmonic valve was grossly normal. Pulmonic valve regurgitation is not visualized. No evidence of pulmonic stenosis. Aorta: The aortic root and ascending aorta are structurally normal, with no evidence of dilitation. Venous: The inferior vena cava is normal in size with greater than 50%  respiratory variability, suggesting right atrial pressure of 3 mmHg. IAS/Shunts: The atrial septum is grossly normal.  LEFT VENTRICLE PLAX 2D LVIDd:         4.00 cm  Diastology LVIDs:         3.20 cm  LV e' lateral:   8.49 cm/s LV PW:         1.10 cm  LV E/e' lateral: 10.0 LV IVS:        1.30 cm  LV e' medial:    6.09 cm/s LVOT diam:     2.10 cm  LV E/e' medial:  14.0 LV SV:         83 LV SV Index:   44 LVOT Area:     3.46 cm  RIGHT VENTRICLE            IVC RV Basal diam:  2.90 cm    IVC diam: 2.00 cm RV S prime:     8.70 cm/s TAPSE (M-mode): 2.0 cm LEFT ATRIUM             Index       RIGHT ATRIUM           Index LA diam:        3.60 cm 1.92 cm/m  RA Area:     13.00 cm LA Vol (A2C):   42.4 ml 22.55 ml/m RA Volume:   28.50 ml  15.16 ml/m LA Vol (A4C):   25.9 ml 13.78 ml/m LA Biplane Vol: 33.6 ml 17.87 ml/m  AORTIC VALVE AV Area (Vmax):    1.93 cm AV Area (Vmean):   1.80 cm AV Area (VTI):     1.93 cm AV Vmax:           184.60 cm/s AV Vmean:          131.928 cm/s AV VTI:            0.432 m AV Peak Grad:      13.6 mmHg AV Mean Grad:      7.6 mmHg LVOT Vmax:         103.00 cm/s LVOT Vmean:        68.600 cm/s LVOT VTI:          0.241 m LVOT/AV VTI ratio: 0.56  AORTA Ao Root diam: 3.50 cm Ao Asc diam:  3.40 cm MITRAL VALVE MV Area (PHT): 3.03 cm    SHUNTS MV Decel Time: 250 msec    Systemic VTI:  0.24 m MV E velocity: 85.30 cm/s  Systemic Diam: 2.10 cm MV A velocity: 72.40 cm/s MV E/A ratio:  1.18 Eleonore Chiquito MD Electronically signed by Eleonore Chiquito MD Signature Date/Time: 09/19/2019/4:09:37 PM    Final    CT HEAD CODE STROKE WO CONTRAST  Result Date: 09/18/2019 CLINICAL DATA:  Code stroke.  Seizure and altered mental status EXAM: CT HEAD WITHOUT CONTRAST TECHNIQUE: Contiguous axial images were obtained from the base of the skull through the vertex without intravenous contrast. COMPARISON:  11/01/2018 FINDINGS: Brain: There is no mass, hemorrhage or extra-axial collection. The size and configuration of the  ventricles and extra-axial CSF spaces are normal. There is encephalomalacia of the anterior left temporal lobe, unchanged. There is periventricular hypoattenuation compatible with chronic microvascular disease. Vascular: No abnormal hyperdensity of the major intracranial arteries or dural venous sinuses. No intracranial atherosclerosis. Skull: The visualized skull base, calvarium and extracranial soft tissues are normal. Sinuses/Orbits: No fluid levels or advanced mucosal thickening of the visualized paranasal sinuses. No mastoid or middle ear effusion. The orbits are normal. ASPECTS [facility] Stroke Program Early CT Score) - Ganglionic level infarction (caudate, lentiform nuclei, internal capsule, insula, M1-M3 cortex): 7 - Supraganglionic infarction (M4-M6 cortex): 3 Total score (0-10 with 10 being normal): 10 IMPRESSION: 1. No acute hemorrhage or mass lesion. 2. ASPECTS is 10. * These results were communicated to Dr. Roland Rack at 11:14 pm on 09/18/2019 by text page via the [facility] messaging system. Electronically Signed   By: Ulyses Jarred M.D.   On: 09/18/2019 23:15    Microbiology: Recent Results (from the past 240 hour(s))  Culture, blood (routine x 2)     Status: None (Preliminary result)   Collection Time: 09/19/19  1:39 AM   Specimen: BLOOD RIGHT HAND  Result Value Ref Range Status   Specimen Description BLOOD RIGHT HAND  Final   Special Requests   Final    BOTTLES DRAWN AEROBIC AND ANAEROBIC Blood Culture results may not be optimal due to an inadequate volume of blood received in culture bottles   Culture   Final    NO GROWTH 4 DAYS Performed at [facility], [place] [address] [place], [place] [address]    Report Status PENDING  Incomplete  Culture, blood (routine x 2)     Status: None (Preliminary result)   Collection Time: 09/19/19  1:39 AM   Specimen: BLOOD LEFT HAND  Result Value Ref Range Status   Specimen Description BLOOD LEFT HAND  Final   Special Requests   Final     BOTTLES DRAWN AEROBIC ONLY Blood Culture results may not be optimal due to an inadequate volume of blood received in culture bottles   Culture   Final    NO GROWTH 4 DAYS Performed at [facility], [place] [address], [place], [place] [address]    Report Status PENDING  Incomplete  Urine culture     Status: Abnormal   Collection Time: 09/19/19  1:48 AM   Specimen: Urine, Clean Catch  Result Value Ref Range Status   Specimen Description URINE, CLEAN CATCH  Final   Special Requests   Final    NONE Performed at [facility], [place] [address], [place], [place] [address]    Culture >=100,000 COLONIES/mL KLEBSIELLA OXYTOCA (A)  Final   Report Status 09/23/2019 FINAL  Final   Organism ID, Bacteria KLEBSIELLA OXYTOCA (A)  Final      Susceptibility   Klebsiella oxytoca - MIC*    AMPICILLIN >=32 RESISTANT Resistant     CEFAZOLIN 8 SENSITIVE Sensitive     CEFTRIAXONE <=0.25 SENSITIVE Sensitive     CIPROFLOXACIN <=0.25 SENSITIVE Sensitive     GENTAMICIN <=1 SENSITIVE Sensitive     IMIPENEM <=0.25 SENSITIVE Sensitive     NITROFURANTOIN 32 SENSITIVE Sensitive  TRIMETH/SULFA <=20 SENSITIVE Sensitive     AMPICILLIN/SULBACTAM 16 INTERMEDIATE Intermediate     PIP/TAZO <=4 SENSITIVE Sensitive     * >=100,000 COLONIES/mL KLEBSIELLA OXYTOCA  SARS CORONAVIRUS 2 (TAT 6-24 HRS) Nasopharyngeal Nasopharyngeal Swab     Status: None   Collection Time: 09/19/19  1:48 AM   Specimen: Nasopharyngeal Swab  Result Value Ref Range Status   SARS Coronavirus 2 NEGATIVE NEGATIVE Final    Comment: (NOTE) SARS-CoV-2 target nucleic acids are NOT DETECTED. The SARS-CoV-2 RNA is generally detectable in upper and lower respiratory specimens during the acute phase of infection. Negative results do not preclude SARS-CoV-2 infection, do not rule out co-infections with other pathogens, and should not be used as the sole basis for treatment or other patient management decisions. Negative results must be combined  with clinical observations, patient history, and epidemiological information. The expected result is Negative. Fact Sheet for Patients: SugarRoll.be Fact Sheet for Healthcare Providers: https://www.woods-mathews.com/ This test is not yet approved or cleared by the [place] FDA and  has been authorized for detection and/or diagnosis of SARS-CoV-2 by FDA under an Emergency Use Authorization (EUA). This EUA will remain  in effect (meaning this test can be used) for the duration of the COVID-19 declaration under Section 56 4(b)(1) of the Act, 21 U.S.C. section 360bbb-3(b)(1), unless the authorization is terminated or revoked sooner. Performed at [facility], [place] [address] [place], [place] [address]      Labs: Basic Metabolic Panel: Recent Labs  Lab 09/19/19 0319 09/20/19 0301 09/21/19 0219 09/22/19 0333 09/23/19 0619  NA 135 136 137 135 137  K 5.2* 3.7 3.7 3.9 4.3  CL 99 102 101 101 101  CO2 22 24 25 23 25   GLUCOSE 133* 95 117* 97 107*  BUN 22 11 11 11 18   CREATININE 1.24 0.87 0.97 0.92 1.03  CALCIUM 9.3 8.8* 9.0 8.8* 9.0   Liver Function Tests: Recent Labs  Lab 09/18/19 2256  AST 39  ALT 27  ALKPHOS 71  BILITOT 0.8  PROT 6.3*  ALBUMIN 3.8   No results for input(s): LIPASE, AMYLASE in the last 168 hours. No results for input(s): AMMONIA in the last 168 hours. CBC: Recent Labs  Lab 09/18/19 2256 09/18/19 2305 09/19/19 0319 09/20/19 0301 09/21/19 0219 09/22/19 0333 09/23/19 0857  WBC 27.7*   < > 25.8* 11.8* 9.7 9.5 8.8  NEUTROABS 24.2*  --   --  8.2* 5.8 5.9 5.8  HGB 14.6   < > 15.1 13.8 14.0 14.1 15.5  HCT 43.2   < > 43.8 39.9 41.2 41.8 46.2  MCV 97.3   < > 94.8 94.5 96.5 95.9 96.7  PLT 268   < > 248 189 196 208 260   < > = values in this interval not displayed.   Cardiac Enzymes: No results for input(s): CKTOTAL, CKMB, CKMBINDEX, TROPONINI in the last 168 hours. BNP: BNP (last 3 results) Recent  Labs    11/01/18 2348  BNP 316.3*    ProBNP (last 3 results) No results for input(s): PROBNP in the last 8760 hours.  CBG: Recent Labs  Lab 09/20/19 0147  GLUCAP 40    Signed:  Domenic Polite MD.  [facility] 09/23/2019, 1:46 PM

## 2019-09-23 NOTE — Progress Notes (Signed)
Pt discharged today to [facility] via [place].  Pt taken off telemetry and CCMD notified.  Pt's IV removed.  Pt left with all of their personal belongings.  AVS documentation reviewed and sent with PTAR.  Report given to Judieth Keens RN at [facility].

## 2019-09-23 NOTE — TOC Transition Note (Signed)
Transition of Care [facility]) - CM/SW Discharge Note   Patient Details  Name: [name] [name] MRN: [id number] Date of Birth: Nov 30, 1924  [facility] CM/SW Contact:  Vinie Sill, [place] Phone Number: 09/23/2019, 2:39 PM   Clinical Narrative:     Patient will DC to: [place] Date: 09/23/2019 Family Notified:[name],daughter Transport ND:[phone]  RN, patient, and facility notified of DC. Discharge Summary sent to facility. RN given number for [phone], ask for Nurse. Ambulance transport requested for patient.   Clinical Social Worker signing off.  Thurmond Butts, MSW, LCSWA Clinical Social Worker    Final next level of care: [place]     Patient Goals and CMS Choice        Discharge Placement PASRR number recieved: 09/21/19            Patient chooses bed at: [facility] Patient to be transferred to facility by: [place] Name of family member notified: [name], daughter Patient and family notified of of transfer: 09/23/19  Discharge Plan and Services                                     Social Determinants of Health (SDOH) Interventions     Readmission Risk Interventions No flowsheet data found.

## 2019-09-24 LAB — CULTURE, BLOOD (ROUTINE X 2)
Culture: NO GROWTH
Culture: NO GROWTH

## 2019-09-26 ENCOUNTER — Non-Acute Institutional Stay (SKILLED_NURSING_FACILITY): Payer: Medicare HMO | Admitting: Nurse Practitioner

## 2019-09-26 ENCOUNTER — Encounter: Payer: Self-pay | Admitting: Nurse Practitioner

## 2019-09-26 DIAGNOSIS — N179 Acute kidney failure, unspecified: Secondary | ICD-10-CM | POA: Diagnosis not present

## 2019-09-26 DIAGNOSIS — D519 Vitamin B12 deficiency anemia, unspecified: Secondary | ICD-10-CM

## 2019-09-26 DIAGNOSIS — I509 Heart failure, unspecified: Secondary | ICD-10-CM | POA: Insufficient documentation

## 2019-09-26 DIAGNOSIS — R569 Unspecified convulsions: Secondary | ICD-10-CM

## 2019-09-26 DIAGNOSIS — E785 Hyperlipidemia, unspecified: Secondary | ICD-10-CM

## 2019-09-26 DIAGNOSIS — D329 Benign neoplasm of meninges, unspecified: Secondary | ICD-10-CM

## 2019-09-26 DIAGNOSIS — I2510 Atherosclerotic heart disease of native coronary artery without angina pectoris: Secondary | ICD-10-CM

## 2019-09-26 DIAGNOSIS — I10 Essential (primary) hypertension: Secondary | ICD-10-CM

## 2019-09-26 DIAGNOSIS — N3000 Acute cystitis without hematuria: Secondary | ICD-10-CM | POA: Diagnosis not present

## 2019-09-26 DIAGNOSIS — K219 Gastro-esophageal reflux disease without esophagitis: Secondary | ICD-10-CM | POA: Diagnosis not present

## 2019-09-26 DIAGNOSIS — N401 Enlarged prostate with lower urinary tract symptoms: Secondary | ICD-10-CM

## 2019-09-26 DIAGNOSIS — R3914 Feeling of incomplete bladder emptying: Secondary | ICD-10-CM

## 2019-09-26 DIAGNOSIS — I2583 Coronary atherosclerosis due to lipid rich plaque: Secondary | ICD-10-CM

## 2019-09-26 DIAGNOSIS — I5032 Chronic diastolic (congestive) heart failure: Secondary | ICD-10-CM

## 2019-09-26 DIAGNOSIS — I4891 Unspecified atrial fibrillation: Secondary | ICD-10-CM | POA: Diagnosis not present

## 2019-09-26 NOTE — Assessment & Plan Note (Signed)
Resolved

## 2019-09-26 NOTE — Assessment & Plan Note (Addendum)
Vomited x1 last night, eat breakfast and lunch okay, stated his appetite is not great, but denied further nausea, vomiting, denied constipation or diarrhea. Will start Omeprazole 20mg  qd.

## 2019-09-26 NOTE — Assessment & Plan Note (Addendum)
Hx of, continue ASA, Crestor.

## 2019-09-26 NOTE — Assessment & Plan Note (Signed)
Completed 2 more days of Keflex at [facility] 4/10, 4/11

## 2019-09-26 NOTE — Assessment & Plan Note (Signed)
Hgb wnl, continue Vit B12, update CBC

## 2019-09-26 NOTE — Assessment & Plan Note (Signed)
09/2019 MR brain showed chronic vasogenic edema left temporal lobe correlating with middle cranial fossa meningioma

## 2019-09-26 NOTE — Progress Notes (Signed)
Location:   [facility] Room Number: 45 Place of Service:  SNF (31) Provider:  Jakyle Petrucelli, Lennie Odor NP  [name], MD  Patient Care Team: [name], MD as PCP - General (Internal Medicine) Gatha Mayer, MD as Consulting Physician (Gastroenterology) [name], [name], MD as Consulting Physician (Cardiology) [name], MD as Attending Physician (Internal Medicine) Smith Potenza X, NP as Nurse Practitioner (Internal Medicine)  Extended Emergency Contact Information Primary Emergency Contact: Markie,[name] Address: [address]          [place], [place] [address] [name] of [place] Phone: [phone] Mobile Phone: [phone] Relation: Daughter Secondary Emergency Contact: Axtell,[name] Address: [place] [address] [place] Mobile Phone: [phone] Relation: Daughter  Code Status:  DNR Goals of care: Advanced Directive information Advanced Directives 09/27/2019  Does Patient Have a Medical Advance Directive? Yes  Type of Advance Directive [place]  Does patient want to make changes to medical advance directive? No - Patient declined  Copy of [place] in Chart? Yes - validated most recent copy scanned in chart (See row information)  Pre-existing out of facility DNR order (yellow form or pink MOST form) -     Chief Complaint  Patient presents with   Acute Visit    Medication review    HPI:  Pt is a 86 y.o. male seen today for an acute visit for hospitalized 09/18/19-09/23/19 for seizure, CT head in ED was negative for acute intracranial abnormality, CTA head/neck negative, MR brain showed chronic vasogenic edema left temporal lobe correlating with middle cranial fossa meningioma, EEG suggested mild diffuse encephalopathy, started Keppra 542m bid,   f/u Neurology in 1 month. Other issues during the hospitalization: new onset AFib,CHA2DS2-VASc, around  5.Not a candidate for anticoagulation  UTI, Klebsiella Oxytoca, discharged on 2 more days of Keflex,  sepsis wbc 27.7/left shift, CXR no active disease, acute urinary retention, AKI, creat 1.4 from baseline 0.9, chronic CHF, EF 65 to 710% grade 1 diastolic dysfunction, on Furosemide 462mqd. HTN, blood pressure is controlled, on Amlodipine 1077md, Lisinopril 85m52m. Hx of CAD, Hyperlipidemia, on Crestor 5mg 4m BPH, Meningioma. GERD, insomnia, stable, on Trazodone 25mg 48mPeripheral neuropathy, stable, on Gabapentin 200mg q11m Past Medical History:  Diagnosis Date   Allergic rhinitis    Amnesia    Atherosclerotic heart disease of native coronary artery without angina pectoris    BPH (benign prostatic hyperplasia)    Bradycardia    Chronic diastolic (congestive) heart failure (HCC)    Colon polyps    Coronary artery disease    MODERATE   Dizziness    Edema of lower extremity    Erectile dysfunction    H. pylori infection    Hx of    Hard of hearing    Headache(784.0)    History of colon polyps 2009   Dr. Orr/Colonoscopy -small cecal adenoma   History of falling    Hx of colonoscopy 2009   Dr Orr, heLajoyce Cornersrhoids & polyps   Hyperlipidemia    Hypertension    Hypertensive heart disease with heart failure (HCC)   Odinpokalemia    Internal hemorrhoids 2009   Dr. Orr/ CoLajoyce Cornersscopy   Metabolic encephalopathy    Mitral valve regurgitation    Muscle weakness (generalized)    Pneumonia    Primary generalized (osteo)arthritis    Reflux esophagitis  Sepsis ([place]) 11/04/2018   Unspecified hemorrhoids    Unsteadiness on feet    Vitamin D deficiency    Past Surgical History:  Procedure Laterality Date   APPENDECTOMY     CARDIAC CATHETERIZATION  1997   REVEALED MILD TO MODERATE IRREGULARITIES. HIS LEFT EVNTRICULAR SYSTOLIC FUNCTION REVEALED NORMAL EJECTION FRACTION WITH EF OF 70%   CATARACT EXTRACTION, BILATERAL     COLONOSCOPY  multiple   fatty  tumor removal     benign   HEMORROIDECTOMY     HIP ARTHROPLASTY Left 08/17/2013   Procedure: ARTHROPLASTY BIPOLAR HIP;  Surgeon: Gearlean Alf, MD;  Location: [facility];  Service: Orthopedics;  Laterality: Left;   INGUINAL HERNIA REPAIR Bilateral 2006   lower back surgery  2006   NASAL SEPTUM SURGERY     TONSILLECTOMY     UPPER GASTROINTESTINAL ENDOSCOPY      No Known Allergies  Allergies as of 09/26/2019   No Known Allergies     Medication List       Accurate as of September 26, 2019 11:59 PM. If you have any questions, ask your nurse or doctor.        STOP taking these medications   Vitamin D-3 25 MCG (1000 UT) Caps Stopped by: Raeleen Winstanley X Samael Blades, NP     TAKE these medications   acetaminophen 500 MG tablet Commonly known as: TYLENOL Take 500 mg by mouth every 8 (eight) hours as needed for mild pain or headache.   amLODipine 5 MG tablet Commonly known as: NORVASC Take 5 mg by mouth daily.   Anusol-HC 2.5 % rectal cream Generic drug: hydrocortisone Place 1 application rectally daily as needed for hemorrhoids or anal itching (USE PERINEAL APPLICATOR). With perineal applicator   aspirin EC 81 MG tablet Take 81 mg by mouth daily.   Deep Sea Nasal Spray 0.65 % nasal spray Generic drug: sodium chloride Place 1 spray into the nose daily as needed for congestion (BOTH NOSTRILS).   DEXTRAN 70-HYPROMELLOSE OP Place 1 drop into both eyes daily as needed (for dryness).   furosemide 40 MG tablet Commonly known as: LASIX Take 40 mg by mouth daily.   gabapentin 100 MG capsule Commonly known as: NEURONTIN Take 200 mg by mouth at bedtime.   levETIRAcetam 500 MG tablet Commonly known as: KEPPRA Take 1 tablet (500 mg total) by mouth 2 (two) times daily.   lisinopril 40 MG tablet Commonly known as: ZESTRIL Take 40 mg by mouth daily.   omeprazole 20 MG capsule Commonly known as: PRILOSEC Take 20 mg by mouth daily.   Robaxin 500 MG tablet Generic drug: methocarbamol Take  250 mg by mouth See admin instructions. Take 250 mg by mouth in the morning and an additional 250 mg once a day as needed for muscle spasms   rosuvastatin 5 MG tablet Commonly known as: CRESTOR Take 5 mg by mouth daily.   Thera-M Tabs Take 1 tablet by mouth daily.   traZODone 50 MG tablet Commonly known as: DESYREL Take 0.5 tablets (25 mg total) by mouth at bedtime.   vitamin B-12 1000 MCG tablet Commonly known as: CYANOCOBALAMIN Take 1,000 mcg by mouth daily.   Zinc 50 MG Tabs Take 100 mg by mouth See admin instructions. Take 100 mg by mouth two times a week- on Monday(s) and Friday(s) only       Review of Systems  Constitutional: Negative for activity change, appetite change, chills, diaphoresis, fatigue and fever.  HENT: Positive for hearing loss. Negative  for congestion and voice change.   Eyes: Negative for visual disturbance.  Respiratory: Negative for cough, shortness of breath and wheezing.   Cardiovascular: Positive for leg swelling.  Gastrointestinal: Positive for nausea and vomiting. Negative for abdominal distention, abdominal pain, constipation and diarrhea.       Vomited x1 last night  Genitourinary: Negative for difficulty urinating, dysuria and urgency.  Musculoskeletal: Positive for arthralgias, back pain and gait problem.       Lower back, right hip/thigh pain   Skin: Positive for color change. Negative for pallor.  Neurological: Negative for dizziness, speech difficulty, weakness and headaches.       Memory lapses. Tingling in toes.   Psychiatric/Behavioral: Negative for agitation, behavioral problems, hallucinations and sleep disturbance. The patient is not nervous/anxious.     Immunization History  Administered Date(s) Administered   Influenza, High Dose Seasonal PF 03/19/2019, 03/19/2019   Influenza-Unspecified 04/08/2016   Moderna SARS-COVID-2 Vaccination 06/18/2019, 07/16/2019   Pneumococcal Conjugate-13 06/02/2013   Pneumococcal  Polysaccharide-23 04/07/2001   Pertinent  Health Maintenance Due  Topic Date Due   INFLUENZA VACCINE  01/15/2020   PNA vac Low Risk Adult  Completed   Fall Risk  11/03/2017 07/11/2016  Falls in the past year? Yes Yes  Number falls in past yr: 2 or more 2 or more  Injury with Fall? Yes Yes  Comment - broke toe on right foot  Risk Factor Category  High Fall Risk -  Follow up Education provided;Falls prevention discussed -   Functional Status Survey:    Vitals:   09/26/19 0834  BP: 120/70  Pulse: 61  Resp: 18  Temp: 97.6 F (36.4 C)  SpO2: 97%  Weight: 167 lb (75.8 kg)  Height: 5' 6"  (1.676 m)   Body mass index is 26.95 kg/m. Physical Exam Vitals and nursing note reviewed.  Constitutional:      General: He is not in acute distress.    Appearance: Normal appearance. He is not ill-appearing, toxic-appearing or diaphoretic.  HENT:     Head: Normocephalic and atraumatic.     Nose: Nose normal.     Mouth/Throat:     Mouth: Mucous membranes are moist.  Eyes:     Extraocular Movements: Extraocular movements intact.     Conjunctiva/sclera: Conjunctivae normal.     Pupils: Pupils are equal, round, and reactive to light.  Neck:     Vascular: No carotid bruit.  Cardiovascular:     Rate and Rhythm: Normal rate and regular rhythm.     Heart sounds: Murmur present.  Pulmonary:     Breath sounds: No wheezing, rhonchi or rales.  Abdominal:     General: Bowel sounds are normal. There is no distension.     Palpations: Abdomen is soft.     Tenderness: There is no abdominal tenderness. There is no right CVA tenderness, left CVA tenderness, guarding or rebound.  Musculoskeletal:     Cervical back: Normal range of motion and neck supple. No rigidity. No muscular tenderness.     Right lower leg: Edema present.     Left lower leg: Edema present.     Comments: Trace to 1+ edema BLE  Lymphadenopathy:     Cervical: No cervical adenopathy.  Skin:    General: Skin is warm and dry.      Comments: Chronic venous insufficiency skin changes BLE.   Neurological:     General: No focal deficit present.     Mental Status: He is alert. Mental status is  at baseline.     Cranial Nerves: No cranial nerve deficit.     Motor: No weakness.     Coordination: Coordination normal.     Gait: Gait abnormal.     Comments: Oriented to person, place.   Psychiatric:        Mood and Affect: Mood normal.        Behavior: Behavior normal.        Thought Content: Thought content normal.        Judgment: Judgment normal.     Labs reviewed: Recent Labs    11/01/18 2348 11/02/18 0222 11/04/18 0312 11/30/18 0000 09/21/19 0219 09/22/19 0333 09/23/19 0619  NA  --    < > 136   < > 137 135 137  K  --    < > 3.3*   < > 3.7 3.9 4.3  CL  --    < > 103   < > 101 101 101  CO2  --    < > 23   < > 25 23 25   GLUCOSE  --    < > 107*   < > 117* 97 107*  BUN  --    < > 9   < > 11 11 18   CREATININE  --    < > 0.94   < > 0.97 0.92 1.03  CALCIUM  --    < > 9.1   < > 9.0 8.8* 9.0  MG 1.8  --  1.8  --   --   --   --    < > = values in this interval not displayed.   Recent Labs    11/01/18 1624 11/01/18 1624 11/04/18 0312 03/01/19 0000 09/18/19 2256  AST 31   < > 26 17 39  ALT 24   < > 22 16 27   ALKPHOS 64   < > 48 57 71  BILITOT 1.2  --  1.8*  --  0.8  PROT 6.6   < > 5.8* 6.2 6.3*  ALBUMIN 4.1   < > 3.5 3.9 3.8   < > = values in this interval not displayed.   Recent Labs    09/21/19 0219 09/22/19 0333 09/23/19 0857  WBC 9.7 9.5 8.8  NEUTROABS 5.8 5.9 5.8  HGB 14.0 14.1 15.5  HCT 41.2 41.8 46.2  MCV 96.5 95.9 96.7  PLT 196 208 260   Lab Results  Component Value Date   TSH 1.504 11/01/2018   No results found for: HGBA1C Lab Results  Component Value Date   CHOL 121 06/28/2019   HDL 46 06/28/2019   LDLCALC 61 06/28/2019   TRIG 62 06/28/2019   CHOLHDL 2.7 02/17/2017    Significant Diagnostic Results in last 30 days:  EEG  Result Date: 09/19/2019 Lora Havens, MD      09/19/2019 12:28 PM Patient Name: [name] [name] MRN: [id number] Epilepsy Attending: Lora Havens Referring Physician/Provider: Dr. Shela Leff Date: 09/19/2019 Duration: 22.11 minutes Patient history: 86 year old male with history of left temporal meningioma presented with new onset seizures.  EEG evaluate for seizures. Level of alertness: Awake AEDs during EEG study: Keppra Technical aspects: This EEG study was done with scalp electrodes positioned according to the 10-20 International system of electrode placement. Electrical activity was acquired at a sampling rate of 500Hz  and reviewed with a high frequency filter of 70Hz  and a low frequency filter of 1Hz . EEG data were recorded continuously and digitally stored. Description: During awake state,  no clear posterior dominant was seen.  EEG showed intermittent generalized 3 to 5 Hz theta-delta slowing as well as generalized 9 to 10 Hz polymorphic alpha activity. Hyperventilation and photic stimulation were not performed. Abnormality -Intermittent slow, generalized IMPRESSION: This study is suggestive of mild diffuse encephalopathy, nonspecific etiology No seizures or epileptiform discharges were seen throughout the recording. Lora Havens   CT Code Stroke CTA Head W/WO contrast  Result Date: 09/18/2019 CLINICAL DATA:  Seizure and altered mental status EXAM: CT ANGIOGRAPHY HEAD AND NECK TECHNIQUE: Multidetector CT imaging of the head and neck was performed using the standard protocol during bolus administration of intravenous contrast. Multiplanar CT image reconstructions and MIPs were obtained to evaluate the vascular anatomy. Carotid stenosis measurements (when applicable) are obtained utilizing NASCET criteria, using the distal internal carotid diameter as the denominator. CONTRAST:  22m OMNIPAQUE IOHEXOL 350 MG/ML SOLN COMPARISON:  Head CT 09/18/2019 FINDINGS: The examination is markedly degraded by motion. CTA NECK FINDINGS SKELETON: There is  no bony spinal canal stenosis. No lytic or blastic lesion. OTHER NECK: Normal pharynx, larynx and major salivary glands. No cervical lymphadenopathy. Unremarkable thyroid gland. UPPER CHEST: No pneumothorax or pleural effusion. No nodules or masses. AORTIC ARCH: There is mild calcific atherosclerosis of the aortic arch. There is no aneurysm, dissection or hemodynamically significant stenosis of the visualized portion of the aorta. Conventional 3 vessel aortic branching pattern. The visualized proximal subclavian arteries are widely patent. RIGHT CAROTID SYSTEM: No dissection, occlusion or aneurysm. Mild atherosclerotic calcification at the carotid bifurcation without hemodynamically significant stenosis. LEFT CAROTID SYSTEM: No dissection, occlusion or aneurysm. Mild atherosclerotic calcification at the carotid bifurcation without hemodynamically significant stenosis. VERTEBRAL ARTERIES: Left dominant configuration. Both origins are clearly patent. There is no dissection, occlusion or flow-limiting stenosis to the skull base (V1-V3 segments). CTA HEAD FINDINGS POSTERIOR CIRCULATION: --Vertebral arteries: Normal V4 segments. --Posterior inferior cerebellar arteries (PICA): Patent origins from the vertebral arteries. --Anterior inferior cerebellar arteries (AICA): Patent origins from the basilar artery. --Basilar artery: Normal. --Superior cerebellar arteries: Normal. --Posterior cerebral arteries: Normal. The right PCA is partially supplied by a posterior communicating artery (p-comm). ANTERIOR CIRCULATION: --Intracranial internal carotid arteries: Normal. --Anterior cerebral arteries (ACA): Normal. Both A1 segments are present. Patent anterior communicating artery (a-comm). --Middle cerebral arteries (MCA): Normal. VENOUS SINUSES: As permitted by contrast timing, patent. ANATOMIC VARIANTS: None Review of the MIP images confirms the above findings. IMPRESSION: 1. Motion degraded study. Within that limitation, no  large vessel occlusion or hemodynamically significant stenosis of the head or neck. 2.  Aortic atherosclerosis (ICD10-I70.0). Electronically Signed   By: KUlyses JarredM.D.   On: 09/18/2019 23:41   DG Abd 1 View  Result Date: 09/19/2019 CLINICAL DATA:  MRI. Question metal. EXAM: ABDOMEN - 1 VIEW COMPARISON:  None. FINDINGS: Stomach is moderately distended. Bowel gas pattern is otherwise normal. Lung bases are clear. The heart is enlarged. Left hip hemiarthroplasty is present. No other metallic implants are present. Contrast is present within the urinary bladder. IMPRESSION: 1. Left hip hemiarthroplasty. 2. No other metallic implants. 3. Moderate gaseous distention of the stomach. 4. Cardiomegaly without failure. Electronically Signed   By: CSan MorelleM.D.   On: 09/19/2019 07:50   MR BRAIN WO CONTRAST  Result Date: 09/19/2019 CLINICAL DATA:  Altered mental status with unclear cause. EXAM: MRI HEAD WITHOUT CONTRAST TECHNIQUE: Multiplanar, multiecho pulse sequences of the brain and surrounding structures were obtained without intravenous contrast. COMPARISON:  CTA head neck from yesterday brain MRI report 10/17/2015,  images not available FINDINGS: Brain: No acute infarction, hemorrhage, hydrocephalus, or shift. Moderate edema in the anterior left temporal lobe white matter which has been seen on multiple prior scans and correlates with a 2017 brain MRI report showing a meningioma in the left middle cranial fossa. A peritumoral cyst may have developed since prior. Vascular: Normal flow voids Skull and upper cervical spine: Unremarkable marrow signal. Sinuses/Orbits: Negative. Other: Motion degraded and truncated study. T1 and coronal T2 imaging was not acquired. IMPRESSION: 1. Chronic vasogenic edema in the left temporal lobe correlating with a middle cranial fossa meningioma reported in 2017. The previously seen enhancing mass is not visible on this motion degraded and noncontrast study which was  truncated. 2. Background senescent changes. Electronically Signed   By: Monte Fantasia M.D.   On: 09/19/2019 07:22   DG Chest Port 1 View  Result Date: 09/19/2019 CLINICAL DATA:  Altered mental status, status post fall. EXAM: PORTABLE CHEST 1 VIEW COMPARISON:  Nov 01, 2018 FINDINGS: There is no evidence of acute infiltrate, pleural effusion or pneumothorax. The heart size and mediastinal contours are within normal limits. There is moderate severity calcification of the aortic arch. The visualized skeletal structures are unremarkable. IMPRESSION: No active disease. Electronically Signed   By: Virgina Norfolk M.D.   On: 09/19/2019 02:18   ECHOCARDIOGRAM COMPLETE  Result Date: 09/19/2019    ECHOCARDIOGRAM REPORT   Patient Name:   [name] [name] Date of Exam: 09/19/2019 Medical Rec #:  [id number]          Height:       67.0 in Accession #:    [id number]         Weight:       168.4 lb Date of Birth:  February 18, 1925         BSA:          1.880 m Patient Age:    87 years           BP:           158/52 mmHg Patient Gender: M                  HR:           59 bpm. Exam Location:  Inpatient Procedure: 2D Echo, Cardiac Doppler and Color Doppler Indications:    I48.0 Paroxysmal atrial fibrillation  History:        Patient has prior history of Echocardiogram examinations, most                 recent 11/03/2018. CHF, CAD; Risk Factors:Hypertension,                 Dyslipidemia and GERD.  Sonographer:    Jonelle Sidle Dance Referring Phys: [id number] [place]  1. There are multiple cystic structures present in the liver. Dedicated liver ultrasound is recommended if not already obtained.  2. Left ventricular ejection fraction, by estimation, is 65 to 70%. The left ventricle has normal function. The left ventricle has no regional wall motion abnormalities. There is mild concentric left ventricular hypertrophy. Left ventricular diastolic parameters are consistent with Grade I diastolic dysfunction (impaired  relaxation).  3. Right ventricular systolic function is normal. The right ventricular size is mildly enlarged. Tricuspid regurgitation signal is inadequate for assessing PA pressure.  4. The mitral valve is grossly normal. Mild mitral valve regurgitation. No evidence of mitral stenosis.  5. The aortic valve is tricuspid. Aortic valve regurgitation is not visualized. Mild to  moderate aortic valve sclerosis/calcification is present, without any evidence of aortic stenosis.  6. The inferior vena cava is normal in size with greater than 50% respiratory variability, suggesting right atrial pressure of 3 mmHg. Comparison(s): A prior study was performed on 11/03/2018. No significant change from prior study. FINDINGS  Left Ventricle: Left ventricular ejection fraction, by estimation, is 65 to 70%. The left ventricle has normal function. The left ventricle has no regional wall motion abnormalities. The left ventricular internal cavity size was normal in size. There is  mild concentric left ventricular hypertrophy. Left ventricular diastolic parameters are consistent with Grade I diastolic dysfunction (impaired relaxation). Right Ventricle: The right ventricular size is mildly enlarged. No increase in right ventricular wall thickness. Right ventricular systolic function is normal. Tricuspid regurgitation signal is inadequate for assessing PA pressure. Left Atrium: Left atrial size was normal in size. Right Atrium: Right atrial size was normal in size. Pericardium: There is no evidence of pericardial effusion. Presence of pericardial fat pad. Mitral Valve: The mitral valve is grossly normal. Mild mitral valve regurgitation. No evidence of mitral valve stenosis. Tricuspid Valve: The tricuspid valve is grossly normal. Tricuspid valve regurgitation is not demonstrated. No evidence of tricuspid stenosis. Aortic Valve: The aortic valve is tricuspid. Aortic valve regurgitation is not visualized. Mild to moderate aortic valve  sclerosis/calcification is present, without any evidence of aortic stenosis. Aortic valve mean gradient measures 7.6 mmHg. Aortic valve peak gradient measures 13.6 mmHg. Aortic valve area, by VTI measures 1.93 cm. Pulmonic Valve: The pulmonic valve was grossly normal. Pulmonic valve regurgitation is not visualized. No evidence of pulmonic stenosis. Aorta: The aortic root and ascending aorta are structurally normal, with no evidence of dilitation. Venous: The inferior vena cava is normal in size with greater than 50% respiratory variability, suggesting right atrial pressure of 3 mmHg. IAS/Shunts: The atrial septum is grossly normal.  LEFT VENTRICLE PLAX 2D LVIDd:         4.00 cm  Diastology LVIDs:         3.20 cm  LV e' lateral:   8.49 cm/s LV PW:         1.10 cm  LV E/e' lateral: 10.0 LV IVS:        1.30 cm  LV e' medial:    6.09 cm/s LVOT diam:     2.10 cm  LV E/e' medial:  14.0 LV SV:         83 LV SV Index:   44 LVOT Area:     3.46 cm  RIGHT VENTRICLE            IVC RV Basal diam:  2.90 cm    IVC diam: 2.00 cm RV S prime:     8.70 cm/s TAPSE (M-mode): 2.0 cm LEFT ATRIUM             Index       RIGHT ATRIUM           Index LA diam:        3.60 cm 1.92 cm/m  RA Area:     13.00 cm LA Vol (A2C):   42.4 ml 22.55 ml/m RA Volume:   28.50 ml  15.16 ml/m LA Vol (A4C):   25.9 ml 13.78 ml/m LA Biplane Vol: 33.6 ml 17.87 ml/m  AORTIC VALVE AV Area (Vmax):    1.93 cm AV Area (Vmean):   1.80 cm AV Area (VTI):     1.93 cm AV Vmax:  184.60 cm/s AV Vmean:          131.928 cm/s AV VTI:            0.432 m AV Peak Grad:      13.6 mmHg AV Mean Grad:      7.6 mmHg LVOT Vmax:         103.00 cm/s LVOT Vmean:        68.600 cm/s LVOT VTI:          0.241 m LVOT/AV VTI ratio: 0.56  AORTA Ao Root diam: 3.50 cm Ao Asc diam:  3.40 cm MITRAL VALVE MV Area (PHT): 3.03 cm    SHUNTS MV Decel Time: 250 msec    Systemic VTI:  0.24 m MV E velocity: 85.30 cm/s  Systemic Diam: 2.10 cm MV A velocity: 72.40 cm/s MV E/A ratio:  1.18  Eleonore Chiquito MD Electronically signed by Eleonore Chiquito MD Signature Date/Time: 09/19/2019/4:09:37 PM    Final    CT HEAD CODE STROKE WO CONTRAST  Result Date: 09/18/2019 CLINICAL DATA:  Code stroke.  Seizure and altered mental status EXAM: CT HEAD WITHOUT CONTRAST TECHNIQUE: Contiguous axial images were obtained from the base of the skull through the vertex without intravenous contrast. COMPARISON:  11/01/2018 FINDINGS: Brain: There is no mass, hemorrhage or extra-axial collection. The size and configuration of the ventricles and extra-axial CSF spaces are normal. There is encephalomalacia of the anterior left temporal lobe, unchanged. There is periventricular hypoattenuation compatible with chronic microvascular disease. Vascular: No abnormal hyperdensity of the major intracranial arteries or dural venous sinuses. No intracranial atherosclerosis. Skull: The visualized skull base, calvarium and extracranial soft tissues are normal. Sinuses/Orbits: No fluid levels or advanced mucosal thickening of the visualized paranasal sinuses. No mastoid or middle ear effusion. The orbits are normal. ASPECTS [facility] Stroke Program Early CT Score) - Ganglionic level infarction (caudate, lentiform nuclei, internal capsule, insula, M1-M3 cortex): 7 - Supraganglionic infarction (M4-M6 cortex): 3 Total score (0-10 with 10 being normal): 10 IMPRESSION: 1. No acute hemorrhage or mass lesion. 2. ASPECTS is 10. * These results were communicated to Dr. Roland Rack at 11:14 pm on 09/18/2019 by text page via the [facility] messaging system. Electronically Signed   By: Ulyses Jarred M.D.   On: 09/18/2019 23:15    Assessment/Plan Chronic diastolic heart failure (HCC) Compensated, continue Furosemide. CHF, EF 65 to 98%, grade 1 diastolic dysfunction, update CMP/eGFR  Hypertension Blood pressure is controlled, continue Amlodipine, Lisinopril.   New onset atrial fibrillation (HCC) Heart rate is in control. New onset 09/2019 during  hospitalization for seizure, UTI, sepsis. CHA2DS2-VASc, around 5.Not a candidate for anticoagulation  AKI (acute kidney injury) ([place]) Improved.   BPH (benign prostatic hyperplasia) Resolved.   UTI (urinary tract infection) Completed 2 more days of Keflex at [facility] 4/10, 4/11  Hyperlipidemia Continue Crestor.   Seizure [facility]) hospitalized 09/18/19-09/23/19 for seizure, CT head in ED was negative for acute intracranial abnormality, CTA head/neck negative, MR brain showed chronic vasogenic edema left temporal lobe correlating with middle cranial fossa meningioma, EEG suggested mild diffuse encephalopathy, started Keppra 568m bid,   f/u Neurology in 1 month.  CHF (congestive heart failure) (HCC) CHF, EF 65 to 733% grade 1 diastolic dysfunction, continue Furosemide 474mqd.   CAD (coronary artery disease) Hx of, continue ASA, Crestor.   GERD (gastroesophageal reflux disease) Vomited x1 last night, eat breakfast and lunch okay, stated his appetite is not great, but denied further nausea, vomiting, denied constipation or diarrhea. Will start  Omeprazole 27m qd.   Meningioma ([place] 09/2019 MR brain showed chronic vasogenic edema left temporal lobe correlating with middle cranial fossa meningioma  Anemia Hgb wnl, continue Vit B12, update CBC     Family/ staff Communication: plan of care reviewed with the patient and charge nurse.   Labs/tests ordered:  CBC/diff, CMP/eGFR  Time spend 35 minutes.

## 2019-09-26 NOTE — Assessment & Plan Note (Signed)
Heart rate is in control. New onset 09/2019 during hospitalization for seizure, UTI, sepsis. CHA2DS2-VASc, around 5.Not a candidate for anticoagulation

## 2019-09-26 NOTE — Assessment & Plan Note (Signed)
CHF, EF 65 to [id number], grade 1 diastolic dysfunction, continue Furosemide 40mg  qd.

## 2019-09-26 NOTE — Assessment & Plan Note (Signed)
Continue Crestor 

## 2019-09-26 NOTE — Assessment & Plan Note (Signed)
Blood pressure is controlled, continue Amlodipine, Lisinopril.  

## 2019-09-26 NOTE — Assessment & Plan Note (Signed)
Improved

## 2019-09-26 NOTE — Assessment & Plan Note (Addendum)
Compensated, continue Furosemide. CHF, EF 65 to 85%, grade 1 diastolic dysfunction, update CMP/eGFR

## 2019-09-26 NOTE — Assessment & Plan Note (Signed)
hospitalized 09/18/19-09/23/19 for seizure, CT head in ED was negative for acute intracranial abnormality, CTA head/neck negative, MR brain showed chronic vasogenic edema left temporal lobe correlating with middle cranial fossa meningioma, EEG suggested mild diffuse encephalopathy, started Keppra 500mg  bid,   f/u Neurology in 1 month.

## 2019-09-27 ENCOUNTER — Encounter: Payer: Self-pay | Admitting: Internal Medicine

## 2019-09-27 ENCOUNTER — Non-Acute Institutional Stay (SKILLED_NURSING_FACILITY): Payer: Medicare HMO | Admitting: Internal Medicine

## 2019-09-27 DIAGNOSIS — E785 Hyperlipidemia, unspecified: Secondary | ICD-10-CM

## 2019-09-27 DIAGNOSIS — R569 Unspecified convulsions: Secondary | ICD-10-CM

## 2019-09-27 DIAGNOSIS — N3000 Acute cystitis without hematuria: Secondary | ICD-10-CM

## 2019-09-27 DIAGNOSIS — I5032 Chronic diastolic (congestive) heart failure: Secondary | ICD-10-CM

## 2019-09-27 DIAGNOSIS — I4891 Unspecified atrial fibrillation: Secondary | ICD-10-CM

## 2019-09-27 DIAGNOSIS — I10 Essential (primary) hypertension: Secondary | ICD-10-CM | POA: Diagnosis not present

## 2019-09-27 LAB — COMPREHENSIVE METABOLIC PANEL
Albumin: 3.4 — AB (ref 3.5–5.0)
Calcium: 8.9 (ref 8.7–10.7)
Globulin: 1.9

## 2019-09-27 LAB — HEPATIC FUNCTION PANEL
ALT: 23 (ref 10–40)
AST: 20 (ref 14–40)
Alkaline Phosphatase: 60 (ref 25–125)
Bilirubin, Total: 0.7

## 2019-09-27 LAB — BASIC METABOLIC PANEL
BUN: 15 (ref 4–21)
CO2: 29 — AB (ref 13–22)
Chloride: 101 (ref 99–108)
Creatinine: 0.9 (ref 0.6–1.3)
Glucose: 94
Potassium: 4.1 (ref 3.4–5.3)
Sodium: 136 — AB (ref 137–147)

## 2019-09-27 LAB — CBC AND DIFFERENTIAL
HCT: 39 — AB (ref 41–53)
Hemoglobin: 13.4 — AB (ref 13.5–17.5)
Neutrophils Absolute: 7096
Platelets: 230 (ref 150–399)
WBC: 10.8

## 2019-09-27 LAB — CBC: RBC: 4.14 (ref 3.87–5.11)

## 2019-09-27 NOTE — Progress Notes (Signed)
Provider:  [name] MD Location:   [place] Room Number: [id number] Place of Service:  SNF ([phone])  PCP: [name], MD Patient Care Team: [name], MD as PCP - General (Internal Medicine) Gatha Mayer, MD as Consulting Physician (Gastroenterology) [name], [name], MD as Consulting Physician (Cardiology) [name], MD as Attending Physician (Internal Medicine) Mast, Man X, NP as Nurse Practitioner (Internal Medicine)  Extended Emergency Contact Information Primary Emergency Contact: Glahn,[name] Address: [address]          [place], [place] [address] [name] of [place] Phone: [phone] Mobile Phone: [phone] Relation: Daughter Secondary Emergency Contact: Boonstra,[name] Address: [place] [address] [place] Mobile Phone: [phone] Relation: Daughter  Code Status: DNR Goals of Care: Advanced Directive information Advanced Directives 09/27/2019  Does Patient Have a Medical Advance Directive? Yes  Type of Advance Directive [place]  Does patient want to make changes to medical advance directive? No - Patient declined  Copy of [place] in Chart? Yes - validated most recent copy scanned in chart (See row information)  Pre-existing out of facility DNR order (yellow form or pink MOST form) -      Chief Complaint  Patient presents with  . New Admit To SNF    Admission    HPI: Patient is a 86 y.o. male seen today for admission to SNF for Therapy  Admitted in [facility] from 04/04-04/09 for ? Seizures  Patient has a history of BPH, bradycardia, CAD, hypertension, diastolic CHF, GERD,H/o Left Sphenoid MeningiomaAnd Chronic Right Hip Pain  He lives in [place] in [facility] and was found in his room on the Floor. He was then confused and Possible had seizure like activity. Followed by Somnolence. EMS was called and he was send to  ED His MRI showed Temporal Lobe Meningioma Unchanged form Previous studies.  EEG showed Diffuse Encephalopathy. But was started on Keppra BID Also had Positive Urine Culture with Leucocytosis. Treated with IV antibiotics Was found to also have New Onset Atrial Fibrillation. Family agreed to not pursue Anticoagulation at this time due to his age Frailty and Falls. Patient is now admitted ot SNF. Overall stable Looks at his baseline. Could not give me any history. His complain today was that he wants to go back to his room in [place] as it is costing him too much. No New issues by Nurses   Past Medical History:  Diagnosis Date  . Allergic rhinitis   . Amnesia   . Atherosclerotic heart disease of native coronary artery without angina pectoris   . BPH (benign prostatic hyperplasia)   . Bradycardia   . Chronic diastolic (congestive) heart failure ([place])   . Colon polyps   . Coronary artery disease    MODERATE  . Dizziness   . Edema of lower extremity   . Erectile dysfunction   . H. pylori infection    Hx of   . Hard of hearing   . Headache(784.0)   . History of colon polyps 2009   Dr. Orr/Colonoscopy -small cecal adenoma  . History of falling   . Hx of colonoscopy 2009   Dr Lajoyce Corners, hemorrhoids & polyps  . Hyperlipidemia   . Hypertension   . Hypertensive heart disease with heart failure ([place])   . Hypokalemia   . Internal hemorrhoids 2009   Dr. Lajoyce Corners Colonoscopy  .  Metabolic encephalopathy   . Mitral valve regurgitation   . Muscle weakness (generalized)   . Pneumonia   . Primary generalized (osteo)arthritis   . Reflux esophagitis   . Sepsis ([place]) 11/04/2018  . Unspecified hemorrhoids   . Unsteadiness on feet   . Vitamin D deficiency    Past Surgical History:  Procedure Laterality Date  . APPENDECTOMY    . CARDIAC CATHETERIZATION  1997   REVEALED MILD TO MODERATE IRREGULARITIES. HIS LEFT EVNTRICULAR SYSTOLIC FUNCTION REVEALED NORMAL EJECTION FRACTION WITH EF OF 70%  . CATARACT  EXTRACTION, BILATERAL    . COLONOSCOPY  multiple  . fatty tumor removal     benign  . HEMORROIDECTOMY    . HIP ARTHROPLASTY Left 08/17/2013   Procedure: ARTHROPLASTY BIPOLAR HIP;  Surgeon: Gearlean Alf, MD;  Location: [facility];  Service: Orthopedics;  Laterality: Left;  . INGUINAL HERNIA REPAIR Bilateral 2006  . lower back surgery  2006  . NASAL SEPTUM SURGERY    . TONSILLECTOMY    . UPPER GASTROINTESTINAL ENDOSCOPY      reports that he quit smoking about 43 years ago. He has never used smokeless tobacco. He reports current alcohol use. He reports that he does not use drugs. Social History   Socioeconomic History  . Marital status: Widowed    Spouse name: Not on file  . Number of children: 3  . Years of education: Not on file  . Highest education level: Not on file  Occupational History  . Not on file  Tobacco Use  . Smoking status: Former Smoker    Quit date: 10/25/1975    Years since quitting: 43.9  . Smokeless tobacco: Never Used  Substance and Sexual Activity  . Alcohol use: Yes    Comment: occ 6-7 times a year  . Drug use: No  . Sexual activity: Not on file  Other Topics Concern  . Not on file  Social History Narrative   Retired and widowed   Lives at [place]   Right handed   Social Determinants of Health   Financial Resource Strain:   . Difficulty of Paying Living Expenses:   Food Insecurity:   . Worried About Charity fundraiser in the Last Year:   . Arboriculturist in the Last Year:   Transportation Needs:   . Film/video editor (Medical):   Marland Kitchen Lack of Transportation (Non-Medical):   Physical Activity:   . Days of Exercise per Week:   . Minutes of Exercise per Session:   Stress:   . Feeling of Stress :   Social Connections:   . Frequency of Communication with Friends and Family:   . Frequency of Social Gatherings with Friends and Family:   . Attends Religious Services:   . Active Member of Clubs or Organizations:   . Attends Theatre manager Meetings:   Marland Kitchen Marital Status:   Intimate Partner Violence:   . Fear of Current or Ex-Partner:   . Emotionally Abused:   Marland Kitchen Physically Abused:   . Sexually Abused:     Functional Status Survey:    Family History  Problem Relation Age of Onset  . Colon cancer Mother 53       second cancer in 56's deceased after emergency surgery  . Breast cancer Sister     Health Maintenance  Topic Date Due  . INFLUENZA VACCINE  01/15/2020  . TETANUS/TDAP  08/15/2023  . PNA vac Low Risk Adult  Completed  No Known Allergies  Allergies as of 09/27/2019   No Known Allergies     Medication List       Accurate as of September 27, 2019  9:09 AM. If you have any questions, ask your nurse or doctor.        acetaminophen 500 MG tablet Commonly known as: TYLENOL Take 500 mg by mouth every 8 (eight) hours as needed for mild pain or headache.   amLODipine 5 MG tablet Commonly known as: NORVASC Take 5 mg by mouth daily.   Anusol-HC 2.5 % rectal cream Generic drug: hydrocortisone Place 1 application rectally daily as needed for hemorrhoids or anal itching (USE PERINEAL APPLICATOR). With perineal applicator   aspirin EC 81 MG tablet Take 81 mg by mouth daily.   Deep Sea Nasal Spray 0.65 % nasal spray Generic drug: sodium chloride Place 1 spray into the nose daily as needed for congestion (BOTH NOSTRILS).   DEXTRAN 70-HYPROMELLOSE OP Place 1 drop into both eyes daily as needed (for dryness).   furosemide 40 MG tablet Commonly known as: LASIX Take 40 mg by mouth daily.   gabapentin 100 MG capsule Commonly known as: NEURONTIN Take 200 mg by mouth at bedtime.   levETIRAcetam 500 MG tablet Commonly known as: KEPPRA Take 1 tablet (500 mg total) by mouth 2 (two) times daily.   lisinopril 40 MG tablet Commonly known as: ZESTRIL Take 40 mg by mouth daily.   omeprazole 20 MG capsule Commonly known as: PRILOSEC Take 20 mg by mouth daily.   Robaxin 500 MG tablet Generic  drug: methocarbamol Take 250 mg by mouth See admin instructions. Take 250 mg by mouth in the morning and an additional 250 mg once a day as needed for muscle spasms   rosuvastatin 5 MG tablet Commonly known as: CRESTOR Take 5 mg by mouth daily.   Thera-M Tabs Take 1 tablet by mouth daily.   traZODone 50 MG tablet Commonly known as: DESYREL Take 0.5 tablets (25 mg total) by mouth at bedtime.   vitamin B-12 1000 MCG tablet Commonly known as: CYANOCOBALAMIN Take 1,000 mcg by mouth daily.   Zinc 50 MG Tabs Take 100 mg by mouth See admin instructions. Take 100 mg by mouth two times a week- on Monday(s) and Friday(s) only       Review of Systems  Review of Systems  Constitutional: Negative for activity change, appetite change, chills, diaphoresis, fatigue and fever.  HENT: Negative for mouth sores, postnasal drip, rhinorrhea, sinus pain and sore throat.   Respiratory: Negative for apnea, cough, chest tightness, shortness of breath and wheezing.   Cardiovascular: Negative for chest pain, palpitations and leg swelling.  Gastrointestinal: Negative for abdominal distention, abdominal pain, constipation, diarrhea, nausea and vomiting.  Genitourinary: Negative for dysuria and frequency.  Musculoskeletal: Negative for arthralgias, joint swelling and myalgias.  Skin: Negative for rash.  Neurological: Negative for dizziness, syncope, weakness, light-headedness and numbness.  Psychiatric/Behavioral: Negative for behavioral problems, confusion and sleep disturbance.     Vitals:   09/27/19 0859  BP: 120/70  Pulse: 61  Resp: 19  Temp: 97.8 F (36.6 C)  SpO2: 95%  Weight: 167 lb (75.8 kg)  Height: 5\' 6"  (1.676 m)   Body mass index is 26.95 kg/m. Physical Exam Constitutional:  Well-developed and well-nourished.  HENT:  Head: Normocephalic.  Mouth/Throat: Oropharynx is clear and moist.  Eyes: Pupils are equal, round, and reactive to light.  Neck: Neck supple.  Cardiovascular:  Normal rate and normal heart sounds.  No murmur heard. Pulmonary/Chest: Effort normal and breath sounds normal. No respiratory distress. No wheezes. She has no rales.  Abdominal: Soft. Bowel sounds are normal. No distension. There is no tenderness. There is no rebound.  Musculoskeletal: Mild Edema Bilateral  Lymphadenopathy: none Neurological: Alert and responsive. No Focal Deficits Mental status at baseline Skin: Skin is warm and dry.  Psychiatric: Very Anxious   Labs reviewed: Basic Metabolic Panel: Recent Labs    11/01/18 2348 11/02/18 0222 11/04/18 [id number] 11/30/18 0000 09/21/19 0219 09/22/19 0333 09/23/19 0619  NA  --    < > 136   < > 137 135 137  K  --    < > 3.3*   < > 3.7 3.9 4.3  CL  --    < > 103   < > 101 101 101  CO2  --    < > 23   < > 25 23 25   GLUCOSE  --    < > 107*   < > 117* 97 107*  BUN  --    < > 9   < > 11 11 18   CREATININE  --    < > 0.94   < > 0.97 0.92 1.03  CALCIUM  --    < > 9.1   < > 9.0 8.8* 9.0  MG 1.8  --  1.8  --   --   --   --    < > = values in this interval not displayed.   Liver Function Tests: Recent Labs    11/01/18 1624 11/01/18 1624 11/04/18 0312 03/01/19 0000 09/18/19 2256  AST 31   < > 26 17 39  ALT 24   < > 22 16 27   ALKPHOS 64   < > 48 57 71  BILITOT 1.2  --  1.8*  --  0.8  PROT 6.6   < > 5.8* 6.2 6.3*  ALBUMIN 4.1   < > 3.5 3.9 3.8   < > = values in this interval not displayed.   No results for input(s): LIPASE, AMYLASE in the last 8760 hours. Recent Labs    11/01/18 1624  AMMONIA 22   CBC: Recent Labs    09/21/19 0219 09/22/19 0333 09/23/19 0857  WBC 9.7 9.5 8.8  NEUTROABS 5.8 5.9 5.8  HGB 14.0 14.1 15.5  HCT 41.2 41.8 46.2  MCV 96.5 95.9 96.7  PLT 196 208 260   Cardiac Enzymes: Recent Labs    11/01/18 1405 11/01/18 1936 11/02/18 0222 11/02/18 0827  CKTOTAL 343  --   --   --   TROPONINI  --  0.06* 0.05* 0.04*   BNP: Invalid input(s): POCBNP No results found for: HGBA1C Lab Results  Component  Value Date   TSH 1.504 11/01/2018   Lab Results  Component Value Date   VITAMINB12 1,261 (H) 11/01/2018   No results found for: FOLATE No results found for: IRON, TIBC, FERRITIN  Imaging and Procedures obtained prior to SNF admission: EEG  Result Date: 09/19/2019 Lora Havens, MD     09/19/2019 12:28 PM Patient Name: SEIF BAUL MRN: [id number] Epilepsy Attending: Lora Havens Referring Physician/Provider: Dr. Shela Leff Date: 09/19/2019 Duration: 22.11 minutes Patient history: 86 year old male with history of left temporal meningioma presented with new onset seizures.  EEG evaluate for seizures. Level of alertness: Awake AEDs during EEG study: Keppra Technical aspects: This EEG study was done with scalp electrodes positioned according to the 10-20 International system of electrode placement.  Electrical activity was acquired at a sampling rate of 500Hz  and reviewed with a high frequency filter of 70Hz  and a low frequency filter of 1Hz . EEG data were recorded continuously and digitally stored. Description: During awake state, no clear posterior dominant was seen.  EEG showed intermittent generalized 3 to 5 Hz theta-delta slowing as well as generalized 9 to 10 Hz polymorphic alpha activity. Hyperventilation and photic stimulation were not performed. Abnormality -Intermittent slow, generalized IMPRESSION: This study is suggestive of mild diffuse encephalopathy, nonspecific etiology No seizures or epileptiform discharges were seen throughout the recording. Lora Havens   CT Code Stroke CTA Head W/WO contrast  Result Date: 09/18/2019 CLINICAL DATA:  Seizure and altered mental status EXAM: CT ANGIOGRAPHY HEAD AND NECK TECHNIQUE: Multidetector CT imaging of the head and neck was performed using the standard protocol during bolus administration of intravenous contrast. Multiplanar CT image reconstructions and MIPs were obtained to evaluate the vascular anatomy. Carotid stenosis  measurements (when applicable) are obtained utilizing NASCET criteria, using the distal internal carotid diameter as the denominator. CONTRAST:  40mL OMNIPAQUE IOHEXOL 350 MG/ML SOLN COMPARISON:  Head CT 09/18/2019 FINDINGS: The examination is markedly degraded by motion. CTA NECK FINDINGS SKELETON: There is no bony spinal canal stenosis. No lytic or blastic lesion. OTHER NECK: Normal pharynx, larynx and major salivary glands. No cervical lymphadenopathy. Unremarkable thyroid gland. UPPER CHEST: No pneumothorax or pleural effusion. No nodules or masses. AORTIC ARCH: There is mild calcific atherosclerosis of the aortic arch. There is no aneurysm, dissection or hemodynamically significant stenosis of the visualized portion of the aorta. Conventional 3 vessel aortic branching pattern. The visualized proximal subclavian arteries are widely patent. RIGHT CAROTID SYSTEM: No dissection, occlusion or aneurysm. Mild atherosclerotic calcification at the carotid bifurcation without hemodynamically significant stenosis. LEFT CAROTID SYSTEM: No dissection, occlusion or aneurysm. Mild atherosclerotic calcification at the carotid bifurcation without hemodynamically significant stenosis. VERTEBRAL ARTERIES: Left dominant configuration. Both origins are clearly patent. There is no dissection, occlusion or flow-limiting stenosis to the skull base (V1-V3 segments). CTA HEAD FINDINGS POSTERIOR CIRCULATION: --Vertebral arteries: Normal V4 segments. --Posterior inferior cerebellar arteries (PICA): Patent origins from the vertebral arteries. --Anterior inferior cerebellar arteries (AICA): Patent origins from the basilar artery. --Basilar artery: Normal. --Superior cerebellar arteries: Normal. --Posterior cerebral arteries: Normal. The right PCA is partially supplied by a posterior communicating artery (p-comm). ANTERIOR CIRCULATION: --Intracranial internal carotid arteries: Normal. --Anterior cerebral arteries (ACA): Normal. Both A1  segments are present. Patent anterior communicating artery (a-comm). --Middle cerebral arteries (MCA): Normal. VENOUS SINUSES: As permitted by contrast timing, patent. ANATOMIC VARIANTS: None Review of the MIP images confirms the above findings. IMPRESSION: 1. Motion degraded study. Within that limitation, no large vessel occlusion or hemodynamically significant stenosis of the head or neck. 2.  Aortic atherosclerosis (ICD10-I70.0). Electronically Signed   By: Ulyses Jarred M.D.   On: 09/18/2019 23:41   DG Abd 1 View  Result Date: 09/19/2019 CLINICAL DATA:  MRI. Question metal. EXAM: ABDOMEN - 1 VIEW COMPARISON:  None. FINDINGS: Stomach is moderately distended. Bowel gas pattern is otherwise normal. Lung bases are clear. The heart is enlarged. Left hip hemiarthroplasty is present. No other metallic implants are present. Contrast is present within the urinary bladder. IMPRESSION: 1. Left hip hemiarthroplasty. 2. No other metallic implants. 3. Moderate gaseous distention of the stomach. 4. Cardiomegaly without failure. Electronically Signed   By: San Morelle M.D.   On: 09/19/2019 07:50   MR BRAIN WO CONTRAST  Result Date: 09/19/2019 CLINICAL DATA:  Altered mental status with unclear cause. EXAM: MRI HEAD WITHOUT CONTRAST TECHNIQUE: Multiplanar, multiecho pulse sequences of the brain and surrounding structures were obtained without intravenous contrast. COMPARISON:  CTA head neck from yesterday brain MRI report 10/17/2015, images not available FINDINGS: Brain: No acute infarction, hemorrhage, hydrocephalus, or shift. Moderate edema in the anterior left temporal lobe white matter which has been seen on multiple prior scans and correlates with a 2017 brain MRI report showing a meningioma in the left middle cranial fossa. A peritumoral cyst may have developed since prior. Vascular: Normal flow voids Skull and upper cervical spine: Unremarkable marrow signal. Sinuses/Orbits: Negative. Other: Motion degraded  and truncated study. T1 and coronal T2 imaging was not acquired. IMPRESSION: 1. Chronic vasogenic edema in the left temporal lobe correlating with a middle cranial fossa meningioma reported in 2017. The previously seen enhancing mass is not visible on this motion degraded and noncontrast study which was truncated. 2. Background senescent changes. Electronically Signed   By: Monte Fantasia M.D.   On: 09/19/2019 07:22   DG Chest Port 1 View  Result Date: 09/19/2019 CLINICAL DATA:  Altered mental status, status post fall. EXAM: PORTABLE CHEST 1 VIEW COMPARISON:  Nov 01, 2018 FINDINGS: There is no evidence of acute infiltrate, pleural effusion or pneumothorax. The heart size and mediastinal contours are within normal limits. There is moderate severity calcification of the aortic arch. The visualized skeletal structures are unremarkable. IMPRESSION: No active disease. Electronically Signed   By: Virgina Norfolk M.D.   On: 09/19/2019 02:18   ECHOCARDIOGRAM COMPLETE  Result Date: 09/19/2019    ECHOCARDIOGRAM REPORT   Patient Name:   MORDECHAI WEISE Date of Exam: 09/19/2019 Medical Rec #:  [id number]          Height:       67.0 in Accession #:    [id number]         Weight:       168.4 lb Date of Birth:  1925/01/07         BSA:          1.880 m Patient Age:    78 years           BP:           158/52 mmHg Patient Gender: M                  HR:           59 bpm. Exam Location:  Inpatient Procedure: 2D Echo, Cardiac Doppler and Color Doppler Indications:    I48.0 Paroxysmal atrial fibrillation  History:        Patient has prior history of Echocardiogram examinations, most                 recent 11/03/2018. CHF, CAD; Risk Factors:Hypertension,                 Dyslipidemia and GERD.  Sonographer:    Jonelle Sidle Dance Referring Phys: [id number] [place]  1. There are multiple cystic structures present in the liver. Dedicated liver ultrasound is recommended if not already obtained.  2. Left ventricular  ejection fraction, by estimation, is 65 to 70%. The left ventricle has normal function. The left ventricle has no regional wall motion abnormalities. There is mild concentric left ventricular hypertrophy. Left ventricular diastolic parameters are consistent with Grade I diastolic dysfunction (impaired relaxation).  3. Right ventricular systolic function is normal. The right ventricular size is mildly enlarged. Tricuspid regurgitation signal  is inadequate for assessing PA pressure.  4. The mitral valve is grossly normal. Mild mitral valve regurgitation. No evidence of mitral stenosis.  5. The aortic valve is tricuspid. Aortic valve regurgitation is not visualized. Mild to moderate aortic valve sclerosis/calcification is present, without any evidence of aortic stenosis.  6. The inferior vena cava is normal in size with greater than 50% respiratory variability, suggesting right atrial pressure of 3 mmHg. Comparison(s): A prior study was performed on 11/03/2018. No significant change from prior study. FINDINGS  Left Ventricle: Left ventricular ejection fraction, by estimation, is 65 to 70%. The left ventricle has normal function. The left ventricle has no regional wall motion abnormalities. The left ventricular internal cavity size was normal in size. There is  mild concentric left ventricular hypertrophy. Left ventricular diastolic parameters are consistent with Grade I diastolic dysfunction (impaired relaxation). Right Ventricle: The right ventricular size is mildly enlarged. No increase in right ventricular wall thickness. Right ventricular systolic function is normal. Tricuspid regurgitation signal is inadequate for assessing PA pressure. Left Atrium: Left atrial size was normal in size. Right Atrium: Right atrial size was normal in size. Pericardium: There is no evidence of pericardial effusion. Presence of pericardial fat pad. Mitral Valve: The mitral valve is grossly normal. Mild mitral valve regurgitation. No  evidence of mitral valve stenosis. Tricuspid Valve: The tricuspid valve is grossly normal. Tricuspid valve regurgitation is not demonstrated. No evidence of tricuspid stenosis. Aortic Valve: The aortic valve is tricuspid. Aortic valve regurgitation is not visualized. Mild to moderate aortic valve sclerosis/calcification is present, without any evidence of aortic stenosis. Aortic valve mean gradient measures 7.6 mmHg. Aortic valve peak gradient measures 13.6 mmHg. Aortic valve area, by VTI measures 1.93 cm. Pulmonic Valve: The pulmonic valve was grossly normal. Pulmonic valve regurgitation is not visualized. No evidence of pulmonic stenosis. Aorta: The aortic root and ascending aorta are structurally normal, with no evidence of dilitation. Venous: The inferior vena cava is normal in size with greater than 50% respiratory variability, suggesting right atrial pressure of 3 mmHg. IAS/Shunts: The atrial septum is grossly normal.  LEFT VENTRICLE PLAX 2D LVIDd:         4.00 cm  Diastology LVIDs:         3.20 cm  LV e' lateral:   8.49 cm/s LV PW:         1.10 cm  LV E/e' lateral: 10.0 LV IVS:        1.30 cm  LV e' medial:    6.09 cm/s LVOT diam:     2.10 cm  LV E/e' medial:  14.0 LV SV:         83 LV SV Index:   44 LVOT Area:     3.46 cm  RIGHT VENTRICLE            IVC RV Basal diam:  2.90 cm    IVC diam: 2.00 cm RV S prime:     8.70 cm/s TAPSE (M-mode): 2.0 cm LEFT ATRIUM             Index       RIGHT ATRIUM           Index LA diam:        3.60 cm 1.92 cm/m  RA Area:     13.00 cm LA Vol (A2C):   42.4 ml 22.55 ml/m RA Volume:   28.50 ml  15.16 ml/m LA Vol (A4C):   25.9 ml 13.78 ml/m LA Biplane Vol: 33.6  ml 17.87 ml/m  AORTIC VALVE AV Area (Vmax):    1.93 cm AV Area (Vmean):   1.80 cm AV Area (VTI):     1.93 cm AV Vmax:           184.60 cm/s AV Vmean:          131.928 cm/s AV VTI:            0.432 m AV Peak Grad:      13.6 mmHg AV Mean Grad:      7.6 mmHg LVOT Vmax:         103.00 cm/s LVOT Vmean:        68.600  cm/s LVOT VTI:          0.241 m LVOT/AV VTI ratio: 0.56  AORTA Ao Root diam: 3.50 cm Ao Asc diam:  3.40 cm MITRAL VALVE MV Area (PHT): 3.03 cm    SHUNTS MV Decel Time: 250 msec    Systemic VTI:  0.24 m MV E velocity: 85.30 cm/s  Systemic Diam: 2.10 cm MV A velocity: 72.40 cm/s MV E/A ratio:  1.18 Eleonore Chiquito MD Electronically signed by Eleonore Chiquito MD Signature Date/Time: 09/19/2019/4:09:37 PM    Final    CT HEAD CODE STROKE WO CONTRAST  Result Date: 09/18/2019 CLINICAL DATA:  Code stroke.  Seizure and altered mental status EXAM: CT HEAD WITHOUT CONTRAST TECHNIQUE: Contiguous axial images were obtained from the base of the skull through the vertex without intravenous contrast. COMPARISON:  11/01/2018 FINDINGS: Brain: There is no mass, hemorrhage or extra-axial collection. The size and configuration of the ventricles and extra-axial CSF spaces are normal. There is encephalomalacia of the anterior left temporal lobe, unchanged. There is periventricular hypoattenuation compatible with chronic microvascular disease. Vascular: No abnormal hyperdensity of the major intracranial arteries or dural venous sinuses. No intracranial atherosclerosis. Skull: The visualized skull base, calvarium and extracranial soft tissues are normal. Sinuses/Orbits: No fluid levels or advanced mucosal thickening of the visualized paranasal sinuses. No mastoid or middle ear effusion. The orbits are normal. ASPECTS [facility] Stroke Program Early CT Score) - Ganglionic level infarction (caudate, lentiform nuclei, internal capsule, insula, M1-M3 cortex): 7 - Supraganglionic infarction (M4-M6 cortex): 3 Total score (0-10 with 10 being normal): 10 IMPRESSION: 1. No acute hemorrhage or mass lesion. 2. ASPECTS is 10. * These results were communicated to Dr. Roland Rack at 11:14 pm on 09/18/2019 by text page via the [facility] messaging system. Electronically Signed   By: Ulyses Jarred M.D.   On: 09/18/2019 23:15    Assessment/Plan Seizure  ([place]) Now on Keprra Will continue to monitor Follow up with Neurology  Acute cystitis without hematuria Was treated with Ceftriaxone and Keflex  New onset atrial fibrillation (HCC) In Sinus Rhythm right now Only on Aspirin for Anticoagulation  Chronic diastolic heart failure (HCC) Continue on Lasix EF was more then 60 % Essential hypertension On Norvasc and Lisinopril Hyperlipidemia,  Continue Crestor CAD On Aspirin and statin Right hip Pain On Tylenol and Robaxin PRN Also on Neurontin Gait abnormality Continues to be Wheelchair dependent Working with therapy  Family/ staff Communication:   Labs/tests ordered:  Total time spent in this patient care encounter was  45_  minutes; greater than 50% of the visit spent counseling patient and staff, reviewing records , Labs and coordinating care for problems addressed at this encounter.

## 2019-09-28 ENCOUNTER — Encounter: Payer: Self-pay | Admitting: Nurse Practitioner

## 2019-09-28 ENCOUNTER — Non-Acute Institutional Stay (SKILLED_NURSING_FACILITY): Payer: Medicare HMO | Admitting: Nurse Practitioner

## 2019-09-28 DIAGNOSIS — N401 Enlarged prostate with lower urinary tract symptoms: Secondary | ICD-10-CM

## 2019-09-28 DIAGNOSIS — M5441 Lumbago with sciatica, right side: Secondary | ICD-10-CM

## 2019-09-28 DIAGNOSIS — G4700 Insomnia, unspecified: Secondary | ICD-10-CM | POA: Insufficient documentation

## 2019-09-28 DIAGNOSIS — D519 Vitamin B12 deficiency anemia, unspecified: Secondary | ICD-10-CM | POA: Diagnosis not present

## 2019-09-28 DIAGNOSIS — K219 Gastro-esophageal reflux disease without esophagitis: Secondary | ICD-10-CM

## 2019-09-28 DIAGNOSIS — D329 Benign neoplasm of meninges, unspecified: Secondary | ICD-10-CM | POA: Diagnosis not present

## 2019-09-28 DIAGNOSIS — I4891 Unspecified atrial fibrillation: Secondary | ICD-10-CM | POA: Diagnosis not present

## 2019-09-28 DIAGNOSIS — R3914 Feeling of incomplete bladder emptying: Secondary | ICD-10-CM

## 2019-09-28 DIAGNOSIS — I2510 Atherosclerotic heart disease of native coronary artery without angina pectoris: Secondary | ICD-10-CM

## 2019-09-28 DIAGNOSIS — I10 Essential (primary) hypertension: Secondary | ICD-10-CM

## 2019-09-28 DIAGNOSIS — E785 Hyperlipidemia, unspecified: Secondary | ICD-10-CM

## 2019-09-28 DIAGNOSIS — F5101 Primary insomnia: Secondary | ICD-10-CM

## 2019-09-28 DIAGNOSIS — I2583 Coronary atherosclerosis due to lipid rich plaque: Secondary | ICD-10-CM

## 2019-09-28 DIAGNOSIS — R569 Unspecified convulsions: Secondary | ICD-10-CM

## 2019-09-28 DIAGNOSIS — I5032 Chronic diastolic (congestive) heart failure: Secondary | ICD-10-CM | POA: Diagnosis not present

## 2019-09-28 NOTE — Assessment & Plan Note (Signed)
Per MR 09/2019, observe.

## 2019-09-28 NOTE — Assessment & Plan Note (Signed)
Grade I diastolic dysfunction, trace edema BLE, continue Furosemide.

## 2019-09-28 NOTE — Assessment & Plan Note (Signed)
Stable, continue Trazodone.  

## 2019-09-28 NOTE — Assessment & Plan Note (Signed)
Blood pressure is controlled, continue Amlodipine, Lisinopril.  

## 2019-09-28 NOTE — Progress Notes (Signed)
Location:   [facility] Room Number: 45 Place of Service:  SNF [phone])  Provider: Marlana Latus NP  PCP: [name], MD Patient Care Team: [name], MD as PCP - General (Internal Medicine) Gatha Mayer, MD as Consulting Physician (Gastroenterology) [name], [name], MD as Consulting Physician (Cardiology) [name], MD as Attending Physician (Internal Medicine) Dareth Andrew X, NP as Nurse Practitioner (Internal Medicine)  Extended Emergency Contact Information Primary Emergency Contact: Vowels,[name] Address: [address]          [place], [place] [address] [name] of [place] Phone: [phone] Mobile Phone: [phone] Relation: Daughter Secondary Emergency Contact: Resurreccion,[name] Address: [place] [address] [name] of [place] Mobile Phone: [phone] Relation: Daughter  Code Status: DNR Goals of care:  Advanced Directive information Advanced Directives 09/28/2019  Does Patient Have a Medical Advance Directive? Yes  Type of Advance Directive Living will;Healthcare Power of Attorney  Does patient want to make changes to medical advance directive? No - Patient declined  Copy of [place] in Chart? Yes - validated most recent copy scanned in chart (See row information)  Pre-existing out of facility DNR order (yellow form or pink MOST form) -     No Known Allergies  Chief Complaint  Patient presents with  . Discharge Note    Discharge to [facility]    HPI:  86 y.o. male with medical history of CHF, EF [id number], grade 1 diastolic dysfunction, on Furosemide. HTN, blood pressure is controled on amlodipine 10mg  qd, Lisinopril 40mg  qd. Hx of CAD, hyperlipidemia, on Crestor, BPH, meningioma, GERD, stable on PPI, insomnia, stable, on Trazodone, peripheral neuropathy, on Gabapentin.    The patient was admitted to SNF [facility] 09/23/19 following hospitalization 09/18/19-09/23/19 for seizure,  started Keppra 500mg  bid, f/u neurology in one month. The patient had regained his physical strength, ALD function to prior level, he is ready to return to [facility] for care.   His recent medical issues: new onset Afib, heart rate is conrolled, not a candidate for anticoagulation, resolved UTI, sepsis, AKI, urinary retention.      Past Medical History:  Diagnosis Date  . Allergic rhinitis   . Amnesia   . Atherosclerotic heart disease of native coronary artery without angina pectoris   . BPH (benign prostatic hyperplasia)   . Bradycardia   . Chronic diastolic (congestive) heart failure ([place])   . Colon polyps   . Coronary artery disease    MODERATE  . Dizziness   . Edema of lower extremity   . Erectile dysfunction   . H. pylori infection    Hx of   . Hard of hearing   . Headache(784.0)   . History of colon polyps 2009   Dr. Orr/Colonoscopy -small cecal adenoma  . History of falling   . Hx of colonoscopy 2009   Dr Lajoyce Corners, hemorrhoids & polyps  . Hyperlipidemia   . Hypertension   . Hypertensive heart disease with heart failure ([place])   . Hypokalemia   . Internal hemorrhoids 2009   Dr. Lajoyce Corners Colonoscopy  . Metabolic encephalopathy   . Mitral valve regurgitation   . Muscle weakness (generalized)   . Pneumonia   . Primary generalized (osteo)arthritis   . Reflux esophagitis   . Sepsis ([place]) 11/04/2018  . Unspecified hemorrhoids   . Unsteadiness on feet   . Vitamin D deficiency  Past Surgical History:  Procedure Laterality Date  . APPENDECTOMY    . CARDIAC CATHETERIZATION  1997   REVEALED MILD TO MODERATE IRREGULARITIES. HIS LEFT EVNTRICULAR SYSTOLIC FUNCTION REVEALED NORMAL EJECTION FRACTION WITH EF OF 70%  . CATARACT EXTRACTION, BILATERAL    . COLONOSCOPY  multiple  . fatty tumor removal     benign  . HEMORROIDECTOMY    . HIP ARTHROPLASTY Left 08/17/2013   Procedure: ARTHROPLASTY BIPOLAR HIP;  Surgeon: Gearlean Alf, MD;  Location: [facility] ORS;  Service: Orthopedics;   Laterality: Left;  . INGUINAL HERNIA REPAIR Bilateral 2006  . lower back surgery  2006  . NASAL SEPTUM SURGERY    . TONSILLECTOMY    . UPPER GASTROINTESTINAL ENDOSCOPY        reports that he quit smoking about 43 years ago. He has never used smokeless tobacco. He reports current alcohol use. He reports that he does not use drugs. Social History   Socioeconomic History  . Marital status: Widowed    Spouse name: Not on file  . Number of children: 3  . Years of education: Not on file  . Highest education level: Not on file  Occupational History  . Not on file  Tobacco Use  . Smoking status: Former Smoker    Quit date: 10/25/1975    Years since quitting: 43.9  . Smokeless tobacco: Never Used  Substance and Sexual Activity  . Alcohol use: Yes    Comment: occ 6-7 times a year  . Drug use: No  . Sexual activity: Not on file  Other Topics Concern  . Not on file  Social History Narrative   Retired and widowed   Lives at [place]   Right handed   Social Determinants of Health   Financial Resource Strain:   . Difficulty of Paying Living Expenses:   Food Insecurity:   . Worried About Charity fundraiser in the Last Year:   . Arboriculturist in the Last Year:   Transportation Needs:   . Film/video editor (Medical):   Marland Kitchen Lack of Transportation (Non-Medical):   Physical Activity:   . Days of Exercise per Week:   . Minutes of Exercise per Session:   Stress:   . Feeling of Stress :   Social Connections:   . Frequency of Communication with Friends and Family:   . Frequency of Social Gatherings with Friends and Family:   . Attends Religious Services:   . Active Member of Clubs or Organizations:   . Attends Archivist Meetings:   Marland Kitchen Marital Status:   Intimate Partner Violence:   . Fear of Current or Ex-Partner:   . Emotionally Abused:   Marland Kitchen Physically Abused:   . Sexually Abused:    Functional Status Survey:    No Known Allergies  Pertinent   Health Maintenance Due  Topic Date Due  . INFLUENZA VACCINE  01/15/2020  . PNA vac Low Risk Adult  Completed    Medications: Allergies as of 09/28/2019   No Known Allergies     Medication List       Accurate as of September 28, 2019  4:44 PM. If you have any questions, ask your nurse or doctor.        acetaminophen 500 MG tablet Commonly known as: TYLENOL Take 500 mg by mouth every 8 (eight) hours as needed for mild pain or headache.   amLODipine 5 MG tablet Commonly known as: NORVASC Take 5 mg by  mouth daily.   Anusol-HC 2.5 % rectal cream Generic drug: hydrocortisone Place 1 application rectally daily as needed for hemorrhoids or anal itching (USE PERINEAL APPLICATOR). With perineal applicator   aspirin EC 81 MG tablet Take 81 mg by mouth daily.   Deep Sea Nasal Spray 0.65 % nasal spray Generic drug: sodium chloride Place 1 spray into the nose daily as needed for congestion (BOTH NOSTRILS).   DEXTRAN 70-HYPROMELLOSE OP Place 1 drop into both eyes daily as needed (for dryness).   furosemide 40 MG tablet Commonly known as: LASIX Take 40 mg by mouth daily.   gabapentin 100 MG capsule Commonly known as: NEURONTIN Take 200 mg by mouth at bedtime.   levETIRAcetam 500 MG tablet Commonly known as: KEPPRA Take 1 tablet (500 mg total) by mouth 2 (two) times daily.   lisinopril 40 MG tablet Commonly known as: ZESTRIL Take 40 mg by mouth daily.   omeprazole 20 MG capsule Commonly known as: PRILOSEC Take 20 mg by mouth daily.   Robaxin 500 MG tablet Generic drug: methocarbamol Take 250 mg by mouth See admin instructions. Take 250 mg by mouth in the morning and an additional 250 mg once a day as needed for muscle spasms   rosuvastatin 5 MG tablet Commonly known as: CRESTOR Take 5 mg by mouth daily.   Thera-M Tabs Take 1 tablet by mouth daily.   traZODone 50 MG tablet Commonly known as: DESYREL Take 0.5 tablets (25 mg total) by mouth at bedtime.   vitamin B-12  1000 MCG tablet Commonly known as: CYANOCOBALAMIN Take 1,000 mcg by mouth daily.   Zinc 50 MG Tabs Take 100 mg by mouth See admin instructions. Take 100 mg by mouth two times a week- on Monday(s) and Friday(s) only       Review of Systems  Constitutional: Negative for activity change, appetite change, fatigue, fever and unexpected weight change.  HENT: Positive for hearing loss. Negative for congestion and voice change.   Eyes: Negative for visual disturbance.  Respiratory: Negative for cough and shortness of breath.   Cardiovascular: Positive for leg swelling.  Gastrointestinal: Negative for abdominal distention, abdominal pain, constipation, nausea and vomiting.  Genitourinary: Negative for difficulty urinating, dysuria and urgency.  Musculoskeletal: Positive for arthralgias, back pain and gait problem.       Lower back, right hip/thigh pain   Skin: Positive for color change. Negative for pallor.  Neurological: Negative for tremors, seizures, syncope, speech difficulty, weakness and light-headedness.       Memory lapses. Tingling in toes.   Psychiatric/Behavioral: Negative for agitation, behavioral problems and sleep disturbance. The patient is not nervous/anxious.     Vitals:   09/28/19 1539  BP: 120/70  Pulse: 60  Resp: 20  Temp: 98 F (36.7 C)  SpO2: 95%  Weight: 165 lb 11.2 oz (75.2 kg)  Height: 5\' 6"  (1.676 m)   Body mass index is 26.74 kg/m. Physical Exam Vitals and nursing note reviewed.  Constitutional:      Appearance: Normal appearance.  HENT:     Head: Normocephalic and atraumatic.     Nose: Nose normal.     Mouth/Throat:     Mouth: Mucous membranes are moist.  Eyes:     Extraocular Movements: Extraocular movements intact.     Conjunctiva/sclera: Conjunctivae normal.     Pupils: Pupils are equal, round, and reactive to light.  Cardiovascular:     Rate and Rhythm: Normal rate and regular rhythm.     Heart sounds:  Murmur present.  Pulmonary:      Breath sounds: No wheezing or rales.  Abdominal:     General: Bowel sounds are normal. There is no distension.     Palpations: Abdomen is soft.     Tenderness: There is no abdominal tenderness. There is no left CVA tenderness.  Musculoskeletal:     Cervical back: Normal range of motion and neck supple. No muscular tenderness.     Right lower leg: Edema present.     Left lower leg: Edema present.     Comments: Trace edema BLE  Skin:    General: Skin is warm and dry.     Comments: Chronic venous insufficiency skin changes BLE.   Neurological:     General: No focal deficit present.     Mental Status: He is alert. Mental status is at baseline.     Cranial Nerves: No cranial nerve deficit.     Motor: No weakness.     Coordination: Coordination normal.     Gait: Gait abnormal.     Comments: Oriented to person, place.   Psychiatric:        Mood and Affect: Mood normal.        Behavior: Behavior normal.        Thought Content: Thought content normal.        Judgment: Judgment normal.     Labs reviewed: Basic Metabolic Panel: Recent Labs    11/01/18 2348 11/02/18 0222 11/04/18 0312 11/30/18 0000 09/21/19 0219 09/22/19 0333 09/23/19 0619  NA  --    < > 136   < > 137 135 137  K  --    < > 3.3*   < > 3.7 3.9 4.3  CL  --    < > 103   < > 101 101 101  CO2  --    < > 23   < > 25 23 25   GLUCOSE  --    < > 107*   < > 117* 97 107*  BUN  --    < > 9   < > 11 11 18   CREATININE  --    < > 0.94   < > 0.97 0.92 1.03  CALCIUM  --    < > 9.1   < > 9.0 8.8* 9.0  MG 1.8  --  1.8  --   --   --   --    < > = values in this interval not displayed.   Liver Function Tests: Recent Labs    11/01/18 1624 11/01/18 1624 11/04/18 0312 03/01/19 0000 09/18/19 2256  AST 31   < > 26 17 39  ALT 24   < > 22 16 27   ALKPHOS 64   < > 48 57 71  BILITOT 1.2  --  1.8*  --  0.8  PROT 6.6   < > 5.8* 6.2 6.3*  ALBUMIN 4.1   < > 3.5 3.9 3.8   < > = values in this interval not displayed.   No results for  input(s): LIPASE, AMYLASE in the last 8760 hours. Recent Labs    11/01/18 1624  AMMONIA 22   CBC: Recent Labs    09/21/19 0219 09/22/19 0333 09/23/19 0857  WBC 9.7 9.5 8.8  NEUTROABS 5.8 5.9 5.8  HGB 14.0 14.1 15.5  HCT 41.2 41.8 46.2  MCV 96.5 95.9 96.7  PLT 196 208 260   Cardiac Enzymes: Recent Labs    11/01/18 1405 11/01/18 1936  11/02/18 0222 11/02/18 0827  CKTOTAL 343  --   --   --   TROPONINI  --  0.06* 0.05* 0.04*   BNP: Invalid input(s): POCBNP CBG: Recent Labs    09/20/19 0147  GLUCAP 84    Procedures and Imaging Studies During Stay: EEG  Result Date: 09/19/2019 Lora Havens, MD     09/19/2019 12:28 PM Patient Name: JORELL GIBNEY MRN: [id number] Epilepsy Attending: Lora Havens Referring Physician/Provider: Dr. Shela Leff Date: 09/19/2019 Duration: 22.11 minutes Patient history: 86 year old male with history of left temporal meningioma presented with new onset seizures.  EEG evaluate for seizures. Level of alertness: Awake AEDs during EEG study: Keppra Technical aspects: This EEG study was done with scalp electrodes positioned according to the 10-20 International system of electrode placement. Electrical activity was acquired at a sampling rate of 500Hz  and reviewed with a high frequency filter of 70Hz  and a low frequency filter of 1Hz . EEG data were recorded continuously and digitally stored. Description: During awake state, no clear posterior dominant was seen.  EEG showed intermittent generalized 3 to 5 Hz theta-delta slowing as well as generalized 9 to 10 Hz polymorphic alpha activity. Hyperventilation and photic stimulation were not performed. Abnormality -Intermittent slow, generalized IMPRESSION: This study is suggestive of mild diffuse encephalopathy, nonspecific etiology No seizures or epileptiform discharges were seen throughout the recording. Lora Havens   CT Code Stroke CTA Head W/WO contrast  Result Date: 09/18/2019 CLINICAL DATA:   Seizure and altered mental status EXAM: CT ANGIOGRAPHY HEAD AND NECK TECHNIQUE: Multidetector CT imaging of the head and neck was performed using the standard protocol during bolus administration of intravenous contrast. Multiplanar CT image reconstructions and MIPs were obtained to evaluate the vascular anatomy. Carotid stenosis measurements (when applicable) are obtained utilizing NASCET criteria, using the distal internal carotid diameter as the denominator. CONTRAST:  67mL OMNIPAQUE IOHEXOL 350 MG/ML SOLN COMPARISON:  Head CT 09/18/2019 FINDINGS: The examination is markedly degraded by motion. CTA NECK FINDINGS SKELETON: There is no bony spinal canal stenosis. No lytic or blastic lesion. OTHER NECK: Normal pharynx, larynx and major salivary glands. No cervical lymphadenopathy. Unremarkable thyroid gland. UPPER CHEST: No pneumothorax or pleural effusion. No nodules or masses. AORTIC ARCH: There is mild calcific atherosclerosis of the aortic arch. There is no aneurysm, dissection or hemodynamically significant stenosis of the visualized portion of the aorta. Conventional 3 vessel aortic branching pattern. The visualized proximal subclavian arteries are widely patent. RIGHT CAROTID SYSTEM: No dissection, occlusion or aneurysm. Mild atherosclerotic calcification at the carotid bifurcation without hemodynamically significant stenosis. LEFT CAROTID SYSTEM: No dissection, occlusion or aneurysm. Mild atherosclerotic calcification at the carotid bifurcation without hemodynamically significant stenosis. VERTEBRAL ARTERIES: Left dominant configuration. Both origins are clearly patent. There is no dissection, occlusion or flow-limiting stenosis to the skull base (V1-V3 segments). CTA HEAD FINDINGS POSTERIOR CIRCULATION: --Vertebral arteries: Normal V4 segments. --Posterior inferior cerebellar arteries (PICA): Patent origins from the vertebral arteries. --Anterior inferior cerebellar arteries (AICA): Patent origins from the  basilar artery. --Basilar artery: Normal. --Superior cerebellar arteries: Normal. --Posterior cerebral arteries: Normal. The right PCA is partially supplied by a posterior communicating artery (p-comm). ANTERIOR CIRCULATION: --Intracranial internal carotid arteries: Normal. --Anterior cerebral arteries (ACA): Normal. Both A1 segments are present. Patent anterior communicating artery (a-comm). --Middle cerebral arteries (MCA): Normal. VENOUS SINUSES: As permitted by contrast timing, patent. ANATOMIC VARIANTS: None Review of the MIP images confirms the above findings. IMPRESSION: 1. Motion degraded study. Within that limitation, no large vessel occlusion  or hemodynamically significant stenosis of the head or neck. 2.  Aortic atherosclerosis (ICD10-I70.0). Electronically Signed   By: Ulyses Jarred M.D.   On: 09/18/2019 23:41   DG Abd 1 View  Result Date: 09/19/2019 CLINICAL DATA:  MRI. Question metal. EXAM: ABDOMEN - 1 VIEW COMPARISON:  None. FINDINGS: Stomach is moderately distended. Bowel gas pattern is otherwise normal. Lung bases are clear. The heart is enlarged. Left hip hemiarthroplasty is present. No other metallic implants are present. Contrast is present within the urinary bladder. IMPRESSION: 1. Left hip hemiarthroplasty. 2. No other metallic implants. 3. Moderate gaseous distention of the stomach. 4. Cardiomegaly without failure. Electronically Signed   By: San Morelle M.D.   On: 09/19/2019 07:50   MR BRAIN WO CONTRAST  Result Date: 09/19/2019 CLINICAL DATA:  Altered mental status with unclear cause. EXAM: MRI HEAD WITHOUT CONTRAST TECHNIQUE: Multiplanar, multiecho pulse sequences of the brain and surrounding structures were obtained without intravenous contrast. COMPARISON:  CTA head neck from yesterday brain MRI report 10/17/2015, images not available FINDINGS: Brain: No acute infarction, hemorrhage, hydrocephalus, or shift. Moderate edema in the anterior left temporal lobe white matter  which has been seen on multiple prior scans and correlates with a 2017 brain MRI report showing a meningioma in the left middle cranial fossa. A peritumoral cyst may have developed since prior. Vascular: Normal flow voids Skull and upper cervical spine: Unremarkable marrow signal. Sinuses/Orbits: Negative. Other: Motion degraded and truncated study. T1 and coronal T2 imaging was not acquired. IMPRESSION: 1. Chronic vasogenic edema in the left temporal lobe correlating with a middle cranial fossa meningioma reported in 2017. The previously seen enhancing mass is not visible on this motion degraded and noncontrast study which was truncated. 2. Background senescent changes. Electronically Signed   By: Monte Fantasia M.D.   On: 09/19/2019 07:22   DG Chest Port 1 View  Result Date: 09/19/2019 CLINICAL DATA:  Altered mental status, status post fall. EXAM: PORTABLE CHEST 1 VIEW COMPARISON:  Nov 01, 2018 FINDINGS: There is no evidence of acute infiltrate, pleural effusion or pneumothorax. The heart size and mediastinal contours are within normal limits. There is moderate severity calcification of the aortic arch. The visualized skeletal structures are unremarkable. IMPRESSION: No active disease. Electronically Signed   By: Virgina Norfolk M.D.   On: 09/19/2019 02:18   ECHOCARDIOGRAM COMPLETE  Result Date: 09/19/2019    ECHOCARDIOGRAM REPORT   Patient Name:   AVIR WUNSCH Date of Exam: 09/19/2019 Medical Rec #:  [id number]          Height:       67.0 in Accession #:    [id number]         Weight:       168.4 lb Date of Birth:  [birth year]         BSA:          1.880 m Patient Age:    89 years           BP:           158/52 mmHg Patient Gender: M                  HR:           59 bpm. Exam Location:  Inpatient Procedure: 2D Echo, Cardiac Doppler and Color Doppler Indications:    I48.0 Paroxysmal atrial fibrillation  History:        Patient has prior history of Echocardiogram examinations, most  recent 11/03/2018. CHF, CAD; Risk Factors:Hypertension,                 Dyslipidemia and GERD.  Sonographer:    Jonelle Sidle Dance Referring Phys: [id number] [place]  1. There are multiple cystic structures present in the liver. Dedicated liver ultrasound is recommended if not already obtained.  2. Left ventricular ejection fraction, by estimation, is 65 to 70%. The left ventricle has normal function. The left ventricle has no regional wall motion abnormalities. There is mild concentric left ventricular hypertrophy. Left ventricular diastolic parameters are consistent with Grade I diastolic dysfunction (impaired relaxation).  3. Right ventricular systolic function is normal. The right ventricular size is mildly enlarged. Tricuspid regurgitation signal is inadequate for assessing PA pressure.  4. The mitral valve is grossly normal. Mild mitral valve regurgitation. No evidence of mitral stenosis.  5. The aortic valve is tricuspid. Aortic valve regurgitation is not visualized. Mild to moderate aortic valve sclerosis/calcification is present, without any evidence of aortic stenosis.  6. The inferior vena cava is normal in size with greater than 50% respiratory variability, suggesting right atrial pressure of 3 mmHg. Comparison(s): A prior study was performed on 11/03/2018. No significant change from prior study. FINDINGS  Left Ventricle: Left ventricular ejection fraction, by estimation, is 65 to 70%. The left ventricle has normal function. The left ventricle has no regional wall motion abnormalities. The left ventricular internal cavity size was normal in size. There is  mild concentric left ventricular hypertrophy. Left ventricular diastolic parameters are consistent with Grade I diastolic dysfunction (impaired relaxation). Right Ventricle: The right ventricular size is mildly enlarged. No increase in right ventricular wall thickness. Right ventricular systolic function is normal. Tricuspid regurgitation  signal is inadequate for assessing PA pressure. Left Atrium: Left atrial size was normal in size. Right Atrium: Right atrial size was normal in size. Pericardium: There is no evidence of pericardial effusion. Presence of pericardial fat pad. Mitral Valve: The mitral valve is grossly normal. Mild mitral valve regurgitation. No evidence of mitral valve stenosis. Tricuspid Valve: The tricuspid valve is grossly normal. Tricuspid valve regurgitation is not demonstrated. No evidence of tricuspid stenosis. Aortic Valve: The aortic valve is tricuspid. Aortic valve regurgitation is not visualized. Mild to moderate aortic valve sclerosis/calcification is present, without any evidence of aortic stenosis. Aortic valve mean gradient measures 7.6 mmHg. Aortic valve peak gradient measures 13.6 mmHg. Aortic valve area, by VTI measures 1.93 cm. Pulmonic Valve: The pulmonic valve was grossly normal. Pulmonic valve regurgitation is not visualized. No evidence of pulmonic stenosis. Aorta: The aortic root and ascending aorta are structurally normal, with no evidence of dilitation. Venous: The inferior vena cava is normal in size with greater than 50% respiratory variability, suggesting right atrial pressure of 3 mmHg. IAS/Shunts: The atrial septum is grossly normal.  LEFT VENTRICLE PLAX 2D LVIDd:         4.00 cm  Diastology LVIDs:         3.20 cm  LV e' lateral:   8.49 cm/s LV PW:         1.10 cm  LV E/e' lateral: 10.0 LV IVS:        1.30 cm  LV e' medial:    6.09 cm/s LVOT diam:     2.10 cm  LV E/e' medial:  14.0 LV SV:         83 LV SV Index:   44 LVOT Area:     3.46 cm  RIGHT VENTRICLE  IVC RV Basal diam:  2.90 cm    IVC diam: 2.00 cm RV S prime:     8.70 cm/s TAPSE (M-mode): 2.0 cm LEFT ATRIUM             Index       RIGHT ATRIUM           Index LA diam:        3.60 cm 1.92 cm/m  RA Area:     13.00 cm LA Vol (A2C):   42.4 ml 22.55 ml/m RA Volume:   28.50 ml  15.16 ml/m LA Vol (A4C):   25.9 ml 13.78 ml/m LA Biplane  Vol: 33.6 ml 17.87 ml/m  AORTIC VALVE AV Area (Vmax):    1.93 cm AV Area (Vmean):   1.80 cm AV Area (VTI):     1.93 cm AV Vmax:           184.60 cm/s AV Vmean:          131.928 cm/s AV VTI:            0.432 m AV Peak Grad:      13.6 mmHg AV Mean Grad:      7.6 mmHg LVOT Vmax:         103.00 cm/s LVOT Vmean:        68.600 cm/s LVOT VTI:          0.241 m LVOT/AV VTI ratio: 0.56  AORTA Ao Root diam: 3.50 cm Ao Asc diam:  3.40 cm MITRAL VALVE MV Area (PHT): 3.03 cm    SHUNTS MV Decel Time: 250 msec    Systemic VTI:  0.24 m MV E velocity: 85.30 cm/s  Systemic Diam: 2.10 cm MV A velocity: 72.40 cm/s MV E/A ratio:  1.18 Eleonore Chiquito MD Electronically signed by Eleonore Chiquito MD Signature Date/Time: 09/19/2019/4:09:37 PM    Final    CT HEAD CODE STROKE WO CONTRAST  Result Date: 09/18/2019 CLINICAL DATA:  Code stroke.  Seizure and altered mental status EXAM: CT HEAD WITHOUT CONTRAST TECHNIQUE: Contiguous axial images were obtained from the base of the skull through the vertex without intravenous contrast. COMPARISON:  11/01/2018 FINDINGS: Brain: There is no mass, hemorrhage or extra-axial collection. The size and configuration of the ventricles and extra-axial CSF spaces are normal. There is encephalomalacia of the anterior left temporal lobe, unchanged. There is periventricular hypoattenuation compatible with chronic microvascular disease. Vascular: No abnormal hyperdensity of the major intracranial arteries or dural venous sinuses. No intracranial atherosclerosis. Skull: The visualized skull base, calvarium and extracranial soft tissues are normal. Sinuses/Orbits: No fluid levels or advanced mucosal thickening of the visualized paranasal sinuses. No mastoid or middle ear effusion. The orbits are normal. ASPECTS [facility] Stroke Program Early CT Score) - Ganglionic level infarction (caudate, lentiform nuclei, internal capsule, insula, M1-M3 cortex): 7 - Supraganglionic infarction (M4-M6 cortex): 3 Total score (0-10  with 10 being normal): 10 IMPRESSION: 1. No acute hemorrhage or mass lesion. 2. ASPECTS is 10. * These results were communicated to Dr. Roland Rack at 11:14 pm on 09/18/2019 by text page via the [facility] messaging system. Electronically Signed   By: Ulyses Jarred M.D.   On: 09/18/2019 23:15    Assessment/Plan:   CAD (coronary artery disease) Stable, continue Statin, ASA  Chronic diastolic heart failure (HCC) Grade I diastolic dysfunction, trace edema BLE, continue Furosemide.   Hypertension Blood pressure is controlled, continue Amlodipine, Lisinopril.   New onset atrial fibrillation (HCC) Heart rate is in control, not a  candidate for anticoagulation therapy.   GERD (gastroesophageal reflux disease) Stable, continue Omeprazole.   Meningioma ([place]) Per MR 09/2019, observe.   BPH (benign prostatic hyperplasia) Stable, no urinary retention.   Anemia Stable, continue Vit B12, Hgb 15.5 09/23/19  Hyperlipidemia LDL 46 06/28/19, continue Crestor  Seizure ([place]) Stable, continue Keppra, f/u neurology.   Lower back pain Lower back and right hip pain, chronic, stable, continue Gabapentin.   Insomnia Stable, continue Trazodone.    Patient is being discharged with the following home health services:    Patient is being discharged with the following durable medical equipment:    Patient has been advised to f/u with their PCP in 1-2 weeks to bring them up to date on their rehab stay.  Social services at facility was responsible for arranging this appointment.  Pt was provided with a 30 day supply of prescriptions for medications and refills must be obtained from their PCP.  For controlled substances, a more limited supply may be provided adequate until PCP appointment only.  Future labs/tests needed:  none

## 2019-09-28 NOTE — Assessment & Plan Note (Signed)
LDL 46 06/28/19, continue Crestor

## 2019-09-28 NOTE — Assessment & Plan Note (Signed)
Stable, continue Omeprazole.  

## 2019-09-28 NOTE — Assessment & Plan Note (Addendum)
Lower back and right hip pain, chronic, stable, continue Gabapentin.

## 2019-09-28 NOTE — Assessment & Plan Note (Signed)
Stable, no urinary retention.

## 2019-09-28 NOTE — Assessment & Plan Note (Signed)
Stable, continue Keppra, f/u neurology.

## 2019-09-28 NOTE — Assessment & Plan Note (Signed)
Stable, continue Statin, ASA 

## 2019-09-28 NOTE — Assessment & Plan Note (Signed)
Heart rate is in control, not a candidate for anticoagulation therapy.

## 2019-09-28 NOTE — Assessment & Plan Note (Signed)
Stable, continue Vit B12, Hgb 15.5 09/23/19

## 2019-10-26 DIAGNOSIS — M545 Low back pain: Secondary | ICD-10-CM | POA: Diagnosis not present

## 2019-11-01 NOTE — Progress Notes (Addendum)
[id number]    Provider:  Dr Jaynee Eagles Referring Provider: [name], MD Primary Care Physician:  [name], MD  CC:  seizure  Interval history 11/02/2019: Patient is referred back for seizure.  I reviewed notes from [facility] where he was admitted in early April of this year.  He was found down at his nursing home after having fallen from a chair, reason for fall is slightly unclear but he was confused after the fall, he was placed in bed and almost immediately thereafter had convulsive activity involving the right side only, lasted approximately 30 seconds and following this he was somnolent, unarousable with sonorous respirations, code stroke was called.  Earlier that day he was in his normal state and speaking normally however she noticed he had a minute long episode where he was staring off into space and smacking his lips, she tried to get his attention was able to, he abruptly stopped and looked at his watch and resume normal conversation.  Diagnosed with partial seizures and started on Keppra.  MRI of the brain showed vasogenic edema in the left temporal lobe correlating to the middle cranial fossa meningioma reported 2017.  However no acute hemorrhage or mass lesion.  Patient was also diagnosed with a urinary tract infection.  EEG completed on April 5 was suggestive of mild diffuse encephalopathy, nonspecific etiology, no seizures or epileptiform discharges were seen throughout the recording.  CTA of the head and neck was negative for large vessel occlusion.  Patient was also found to be in new onset A. Fib, white blood cell count was 27 with large amounts of leukocytes and many bacteria in the UA, and acute kidney injury and active metabolic encephalopathy.    EEG: IMPRESSION: This study is suggestive of mild diffuse encephalopathy, nonspecific etiology. No seizures or epileptiform discharges were seen throughout the recording.   03/2019: Patient is  referred again March 22, 2019 for similar symptoms seen in May 2019.  Dysesthesias of the right leg and low back pain. He has a past medical history of hypertension, Meningioma, coronary artery disease, hyperlipidemia, gait abnormality previously evaluated by neurology, chronic lower extremity edema, episodic dizziness, bradycardia, hard of hearing, leg edema. He has had symptoms for years, tingling in his right big toe, feels like electrical shocks, spreading up the groin.  Recommendations included considering evaluation of his right hip especially given the groin pain.  MRI of the lumbar spine and EMG nerve conduction study were ordered.  He follows with Dr. Ronnald Ramp neurosurgery for meningioma. ESI was ordered. At this time he likely needs to follow with Dr. Ronnald Ramp for any possible interventions or pain clinic for chronic pain management. We did refer him to neurosurgery in 11/2017. He still has a lot of back pain with radiation down the leg. Tingling in the right foot toe. He takes a lot of pain pills. He saw a neurosurgeon 4-5 years ago.   MRI lumbar spine showed:  Abnormal MRI Lumbar Spine showing prominent spondylitic and facet degenerative changes throughout most severe at L 2-3 where there is bilateral severe foraminal and moderate canal stenosis and impingement of bilateral nerve roots and and at L 4-5 where there is severe left sided foraminal narrowing with exiting nerve root impingement. Compared to prior MRI films from 09/30/2004 these changes appear much progressed.  HPI 10/2017:  [name] [name] is a 86 y.o. male here as a referral from Dr. Lyndel Safe for dysesthesias of the right leg.  He has  a past medical history of hypertension, Meningioma, coronary artery disease, hyperlipidemia, gait abnormality previously evaluated by neurology, chronic lower extremity edema, episodic dizziness, bradycardia, hard of hearing, leg edema. He has had symptoms for years, tingling in his right big, feels like electrical  shocks but what has worsened and painful and spreading up the groin, in the groin it hurts, he has to put a pillow between his legs when he sleeps, extra strength tylenol helps, he feels weakness in his leg. The groin pain is NOT associated with the tingling in the toes but feels pain in the anterior thigh. It goes and comes, continuous every day and worsening. He has fallen. He fell this past Sunday on his right hip. Denies back pain. He had surgery in the past which affected his right leg, symptoms may be secondary to his previous back surgery or something new.  Reviewed notes, labs and imaging from outside physicians, which showed:  Reviewed referring physician's notes.  Patient complains of dysesthesias to the right leg.  Trace pitting edema noted in the bilateral lower extremities.  He was referred for neuropathy of the right leg.  He also has a meningioma and he sees neurosurgery Dr. Ronnald Ramp for a second opinion.   CBC unremarkable.  Personally reviewed MRI of the brain 2017 which showed a 12 mm meningioma along the medial aspect of the left middle cranial fossa with significant edema.  Review of Systems: Patient complains of symptoms per HPI as well as the following symptoms: memory loss, seizure. Pertinent negatives and positives per HPI. All others negative    Social History   Socioeconomic History  . Marital status: Widowed    Spouse name: Not on file  . Number of children: 3  . Years of education: Not on file  . Highest education level: Not on file  Occupational History  . Not on file  Tobacco Use  . Smoking status: Former Smoker    Quit date: 10/25/1975    Years since quitting: 44.0  . Smokeless tobacco: Never Used  Substance and Sexual Activity  . Alcohol use: Yes    Comment: occ 6-7 times a year  . Drug use: No  . Sexual activity: Not on file  Other Topics Concern  . Not on file  Social History Narrative   Retired and widowed   Lives at [place]   Right  handed   Social Determinants of Health   Financial Resource Strain:   . Difficulty of Paying Living Expenses:   Food Insecurity:   . Worried About Charity fundraiser in the Last Year:   . Arboriculturist in the Last Year:   Transportation Needs:   . Film/video editor (Medical):   Marland Kitchen Lack of Transportation (Non-Medical):   Physical Activity:   . Days of Exercise per Week:   . Minutes of Exercise per Session:   Stress:   . Feeling of Stress :   Social Connections:   . Frequency of Communication with Friends and Family:   . Frequency of Social Gatherings with Friends and Family:   . Attends Religious Services:   . Active Member of Clubs or Organizations:   . Attends Archivist Meetings:   Marland Kitchen Marital Status:   Intimate Partner Violence:   . Fear of Current or Ex-Partner:   . Emotionally Abused:   Marland Kitchen Physically Abused:   . Sexually Abused:     Family History  Problem Relation Age of Onset  .  Colon cancer Mother 26       second cancer in 67's deceased after emergency surgery  . Breast cancer Sister   . Seizures Neg Hx     Past Medical History:  Diagnosis Date  . Allergic rhinitis   . Amnesia   . Atherosclerotic heart disease of native coronary artery without angina pectoris   . BPH (benign prostatic hyperplasia)   . Bradycardia   . Chronic diastolic (congestive) heart failure ([place])   . Colon polyps   . Coronary artery disease    MODERATE  . Dizziness   . Edema of lower extremity   . Erectile dysfunction   . H. pylori infection    Hx of   . Hard of hearing   . Headache(784.0)   . History of colon polyps 2009   Dr. Orr/Colonoscopy -small cecal adenoma  . History of falling   . Hx of colonoscopy 2009   Dr Lajoyce Corners, hemorrhoids & polyps  . Hyperlipidemia   . Hypertension   . Hypertensive heart disease with heart failure ([place])   . Hypokalemia   . Internal hemorrhoids 2009   Dr. Lajoyce Corners Colonoscopy  . Metabolic encephalopathy   . Mitral valve  regurgitation   . Muscle weakness (generalized)   . Pneumonia   . Primary generalized (osteo)arthritis   . Reflux esophagitis   . Sepsis ([place]) 11/04/2018  . Unspecified hemorrhoids   . Unsteadiness on feet   . Vitamin D deficiency     Past Surgical History:  Procedure Laterality Date  . APPENDECTOMY    . CARDIAC CATHETERIZATION  1997   REVEALED MILD TO MODERATE IRREGULARITIES. HIS LEFT EVNTRICULAR SYSTOLIC FUNCTION REVEALED NORMAL EJECTION FRACTION WITH EF OF 70%  . CATARACT EXTRACTION, BILATERAL    . COLONOSCOPY  multiple  . fatty tumor removal     benign  . HEMORROIDECTOMY    . HIP ARTHROPLASTY Left 08/17/2013   Procedure: ARTHROPLASTY BIPOLAR HIP;  Surgeon: Gearlean Alf, MD;  Location: [facility];  Service: Orthopedics;  Laterality: Left;  . INGUINAL HERNIA REPAIR Bilateral 2006  . lower back surgery  2006  . NASAL SEPTUM SURGERY    . TONSILLECTOMY    . UPPER GASTROINTESTINAL ENDOSCOPY      Current Outpatient Medications  Medication Sig Dispense Refill  . acetaminophen (TYLENOL) 500 MG tablet Take 500 mg by mouth every 8 (eight) hours as needed for mild pain or headache.     Marland Kitchen amLODipine (NORVASC) 5 MG tablet Take 5 mg by mouth daily.    Marland Kitchen aspirin EC 81 MG tablet Take 81 mg by mouth daily.    . Carboxymethylcellulose Sod PF 0.5 % SOLN Place 1 drop into both eyes daily as needed.    Marland Kitchen DEXTRAN 70-HYPROMELLOSE OP Place 1 drop into both eyes daily as needed (for dryness).     . furosemide (LASIX) 40 MG tablet Take 40 mg by mouth daily.     Marland Kitchen gabapentin (NEURONTIN) 100 MG capsule Take 200 mg by mouth at bedtime.    . hydrocortisone (ANUSOL-HC) 2.5 % rectal cream Place 1 application rectally daily as needed for hemorrhoids or anal itching (USE PERINEAL APPLICATOR). With perineal applicator     . levETIRAcetam (KEPPRA) 500 MG tablet Take 1 tablet (500 mg total) by mouth 2 (two) times daily. 180 tablet 4  . lisinopril (ZESTRIL) 40 MG tablet Take 40 mg by mouth daily.    .  methocarbamol (ROBAXIN) 500 MG tablet Take 250 mg by mouth See admin instructions.  Take 250 mg by mouth in the morning and an additional 250 mg once a day as needed for muscle spasms    . Multiple Vitamins-Minerals (THERA-M) TABS Take 1 tablet by mouth daily.    Marland Kitchen omeprazole (PRILOSEC) 20 MG capsule Take 20 mg by mouth daily.    . rosuvastatin (CRESTOR) 5 MG tablet Take 5 mg by mouth daily.     . sodium chloride (DEEP SEA NASAL SPRAY) 0.65 % nasal spray Place 1 spray into the nose daily as needed for congestion (BOTH NOSTRILS).     Marland Kitchen traZODone (DESYREL) 50 MG tablet Take 0.5 tablets (25 mg total) by mouth at bedtime. 30 tablet 0  . vitamin B-12 (CYANOCOBALAMIN) 1000 MCG tablet Take 1,000 mcg by mouth daily.    . Zinc 50 MG TABS Take 100 mg by mouth See admin instructions. Take 100 mg by mouth two times a week- on Monday(s) and Friday(s) only     No current facility-administered medications for this visit.    Allergies as of 11/02/2019  . (No Known Allergies)    Vitals: BP (!) 143/60 (BP Location: Left Arm, Patient Position: Sitting)   Pulse (!) 58   Wt 164 lb (74.4 kg)   SpO2 96%   BMI 26.47 kg/m  Last Weight:  Wt Readings from Last 1 Encounters:  11/02/19 164 lb (74.4 kg)   Last Height:   Ht Readings from Last 1 Encounters:  09/28/19 5\' 6"  (1.676 m)   Neuro: Detailed Neurologic Exam  Speech:    Speech is normal Cognition:    The patient is oriented to person, place, short-term memory loss, can follow simple commands easily. Cranial Nerves:    The pupils are equal, round, and reactive to light.  Blinks to threat in all quadrants. Extraocular movements are intact. Trigeminal sensation is intact and the muscles of mastication are normal. The face is symmetric. The palate elevates in the midline. Hearing impaired. Voice is normal. Shoulder shrug is normal. The tongue has normal motion without fasciculations.   Gait:    Wide based and shuffling, in a wheelchair today but uses a  walker  Motor Observation: No resting tremor or action.  No asymmetry, no atrophy, and no involuntary movements noted. Tone:    Normal muscle tone.  No cogwheeling.   Posture:    Posture is slightly stooped    Strength: Mild right leg flexion weakness but overall 5/5     Sensation: intact to LT     Reflex Exam:  DTR's:    Absent right patellar and AJs.  Othrerise 1+ and symmetrical      Assessment/Plan:   26 86 year old. Recently diagnosed with partial seizures and started on Keppra.  MRI of the brain showed vasogenic edema in the left temporal lobe correlating to the middle cranial fossa meningioma reported 2017.  However no acute hemorrhage or mass lesion.  Patient was also diagnosed with a urinary tract infection.  EEG completed on April 5 was suggestive of mild diffuse encephalopathy, nonspecific etiology, no seizures or epileptiform discharges were seen throughout the recording.  CTA of the head and neck was negative for large vessel occlusion.  Patient was also found to be in new onset A. Fib, white blood cell count was 27 with large amounts of leukocytes and many bacteria in the UA, and acute kidney injury and active metabolic encephalopathy.  - Continue Keppra - Continue to follow with neurosurgery for meningioma - Discussed below however given his cognitive and  hearing problems I am not sure how much he retained, he is not driving at this time anyway and is in a long-term care facility, will send notes back to his residential facily ([facility])  Meds ordered this encounter  Medications  . levETIRAcetam (KEPPRA) 500 MG tablet    Sig: Take 1 tablet (500 mg total) by mouth 2 (two) times daily.    Dispense:  180 tablet    Refill:  4     Per [facility] statutes, patients with seizures are not allowed to drive until they have been seizure-free for six months.    Use caution when using heavy equipment or power tools. Avoid working on ladders or at  heights. Take showers instead of baths. Ensure the water temperature is not too high on the home water heater. Do not go swimming alone. Do not lock yourself in a room alone (i.e. bathroom). When caring for infants or small children, sit down when holding, feeding, or changing them to minimize risk of injury to the child in the event you have a seizure. Maintain good sleep hygiene. Avoid alcohol.    If patient has another seizure, call 911 and bring them back to the ED if: A.  The seizure lasts longer than 5 minutes.      B.  The patient doesn't wake shortly after the seizure or has new problems such as difficulty seeing, speaking or moving following the seizure C.  The patient was injured during the seizure D.  The patient has a temperature over 102 F (39C) E.  The patient vomited during the seizure and now is having trouble breathing  Per [facility] statutes, patients with seizures are not allowed to drive until they have been seizure-free for six months.  Other recommendations include using caution when using heavy equipment or power tools. Avoid working on ladders or at heights. Take showers instead of baths.  Do not swim alone.  Ensure the water temperature is not too high on the home water heater. Do not go swimming alone. Do not lock yourself in a room alone (i.e. bathroom). When caring for infants or small children, sit down when holding, feeding, or changing them to minimize risk of injury to the child in the event you have a seizure. Maintain good sleep hygiene. Avoid alcohol.  Also recommend adequate sleep, hydration, good diet and minimize stress.  During the Seizure  - First, ensure adequate ventilation and place patients on the floor on their left side  Loosen clothing around the neck and ensure the airway is patent. If the patient is clenching the teeth, do not force the mouth open with any object as this can cause severe damage - Remove all items from the surrounding that can  be hazardous. The patient may be oblivious to what's happening and may not even know what he or she is doing. If the patient is confused and wandering, either gently guide him/her away and block access to outside areas - Reassure the individual and be comforting - Call 911. In most cases, the seizure ends before EMS arrives. However, there are cases when seizures may last over 3 to 5 minutes. Or the individual may have developed breathing difficulties or severe injuries. If a pregnant patient or a person with diabetes develops a seizure, it is prudent to call an ambulance. - Finally, if the patient does not regain full consciousness, then call EMS. Most patients will remain confused for about 45 to 90 minutes  after a seizure, so you must use judgment in calling for help. - Avoid restraints but make sure the patient is in a bed with padded side rails - Place the individual in a lateral position with the neck slightly flexed; this will help the saliva drain from the mouth and prevent the tongue from falling backward - Remove all nearby furniture and other hazards from the area - Provide verbal assurance as the individual is regaining consciousness - Provide the patient with privacy if possible - Call for help and start treatment as ordered by the caregiver   fter the Seizure (Postictal Stage)  After a seizure, most patients experience confusion, fatigue, muscle pain and/or a headache. Thus, one should permit the individual to sleep. For the next few days, reassurance is essential. Being calm and helping reorient the person is also of importance.  Most seizures are painless and end spontaneously. Seizures are not harmful to others but can lead to complications such as stress on the lungs, brain and the heart. Individuals with prior lung problems may develop labored breathing and respiratory distress.    PRIOR visits:  He has had symptoms for years, tingling in his right leg to the toe , feels like  electrical shocks but what has worsened and painful and spreading  It goes and comes, continuous every day and worsening.   He had surgery in the past which affected his right leg, symptoms may be secondary to his previous back surgery or his progresive lumbar degenerative disease. He has significant and progressive lumbar degenerative disease(see MRI report below). There is not much we can do in neurology, he needs continued pain management, epidural steroid injections, we saw him last year and referred him for both. I will contact Dr. Lyndel Safe and explain. Follow up with Dr. Lyndel Safe for medication management.   Meningioma: Following with Dr. Ronnald Ramp Neurosurgery, may consider injections in the low back as well but he declines injections, no surgery. Will defer to Dr. Lyndel Safe, he declines any surgery, shots did not help. At this point medical management, nothing further from neurology.  MR lumbar spine 2019: MRI lumbar spine showed:  Abnormal MRI Lumbar Spine showing prominent spondylitic and facet degenerative changes throughout most severe at L 2-3 where there is bilateral severe foraminal and moderate canal stenosis and impingement of bilateral nerve roots and and at L 4-5 where there is severe left sided foraminal narrowing with exiting nerve root impingement. Compared to prior MRI films from 09/30/2004 these changes appear much progressed  Falls:  fall precautions.    Cc:Dr. Rozann Lesches, MD  [facility] [address] [place], [place] [address]  Phone [phone] Fax [phone]  I spent 40 minutes of face-to-face and non-face-to-face time with patient on the  1. 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  This included previsit chart review, lab review, study review, order entry, electronic health record documentation, patient education on the different diagnostic and therapeutic options, counseling and  coordination of care, risks and benefits of management, compliance, or risk factor reduction

## 2019-11-02 ENCOUNTER — Encounter: Payer: Self-pay | Admitting: Neurology

## 2019-11-02 ENCOUNTER — Other Ambulatory Visit: Payer: Self-pay

## 2019-11-02 ENCOUNTER — Ambulatory Visit (INDEPENDENT_AMBULATORY_CARE_PROVIDER_SITE_OTHER): Payer: Medicare HMO | Admitting: Neurology

## 2019-11-02 VITALS — BP 143/60 | HR 58 | Wt 164.0 lb

## 2019-11-02 DIAGNOSIS — G40209 Localization-related (focal) (partial) symptomatic epilepsy and epileptic syndromes with complex partial seizures, not intractable, without status epilepticus: Secondary | ICD-10-CM | POA: Insufficient documentation

## 2019-11-02 MED ORDER — LEVETIRACETAM 500 MG PO TABS: 500.0000 mg | ORAL_TABLET | Freq: Two times a day (BID) | ORAL | 4 refills | Status: DC

## 2019-11-02 NOTE — Patient Instructions (Signed)
Continue Keppra  Per [facility] statutes, patients with seizures are not allowed to drive until they have been seizure-free for six months.    Use caution when using heavy equipment or power tools. Avoid working on ladders or at heights. Take showers instead of baths. Ensure the water temperature is not too high on the home water heater. Do not go swimming alone. Do not lock yourself in a room alone (i.e. bathroom). When caring for infants or small children, sit down when holding, feeding, or changing them to minimize risk of injury to the child in the event you have a seizure. Maintain good sleep hygiene. Avoid alcohol.    If patient has another seizure, call 911 and bring them back to the ED if: A.  The seizure lasts longer than 5 minutes.      B.  The patient doesn't wake shortly after the seizure or has new problems such as difficulty seeing, speaking or moving following the seizure C.  The patient was injured during the seizure D.  The patient has a temperature over 102 F (39C) E.  The patient vomited during the seizure and now is having trouble breathing  Per [facility] statutes, patients with seizures are not allowed to drive until they have been seizure-free for six months.  Other recommendations include using caution when using heavy equipment or power tools. Avoid working on ladders or at heights. Take showers instead of baths.  Do not swim alone.  Ensure the water temperature is not too high on the home water heater. Do not go swimming alone. Do not lock yourself in a room alone (i.e. bathroom). When caring for infants or small children, sit down when holding, feeding, or changing them to minimize risk of injury to the child in the event you have a seizure. Maintain good sleep hygiene. Avoid alcohol.  Also recommend adequate sleep, hydration, good diet and minimize stress.  During the Seizure  - First, ensure adequate ventilation and place patients on the floor on their  left side  Loosen clothing around the neck and ensure the airway is patent. If the patient is clenching the teeth, do not force the mouth open with any object as this can cause severe damage - Remove all items from the surrounding that can be hazardous. The patient may be oblivious to what's happening and may not even know what he or she is doing. If the patient is confused and wandering, either gently guide him/her away and block access to outside areas - Reassure the individual and be comforting - Call 911. In most cases, the seizure ends before EMS arrives. However, there are cases when seizures may last over 3 to 5 minutes. Or the individual may have developed breathing difficulties or severe injuries. If a pregnant patient or a person with diabetes develops a seizure, it is prudent to call an ambulance. - Finally, if the patient does not regain full consciousness, then call EMS. Most patients will remain confused for about 45 to 90 minutes after a seizure, so you must use judgment in calling for help. - Avoid restraints but make sure the patient is in a bed with padded side rails - Place the individual in a lateral position with the neck slightly flexed; this will help the saliva drain from the mouth and prevent the tongue from falling backward - Remove all nearby furniture and other hazards from the area - Provide verbal assurance as the individual is regaining consciousness - Provide the  patient with privacy if possible - Call for help and start treatment as ordered by the caregiver   fter the Seizure (Postictal Stage)  After a seizure, most patients experience confusion, fatigue, muscle pain and/or a headache. Thus, one should permit the individual to sleep. For the next few days, reassurance is essential. Being calm and helping reorient the person is also of importance.  Most seizures are painless and end spontaneously. Seizures are not harmful to others but can lead to complications such  as stress on the lungs, brain and the heart. Individuals with prior lung problems may develop labored breathing and respiratory distress.   Seizure, Adult A seizure is a sudden burst of abnormal electrical activity in the brain. Seizures usually last from 30 seconds to 2 minutes. The abnormal activity temporarily interrupts normal brain function. A seizure can cause many different symptoms depending on where in the brain it starts. What are the causes? Common causes of this condition include:  Fever or infection.  Brain abnormality, injury, bleeding, or tumor.  Low blood sugar.  Metabolic disorders or other conditions that are passed from parent to child (are inherited).  Reaction to a substance, such as a drug or a medicine, or suddenly stopping the use of a substance (withdrawal).  Stroke.  Developmental disorders such as autism or cerebral palsy. In some cases, the cause of this condition may not be known. Some people who have a seizure never have another one. Seizures usually do not cause brain damage or permanent problems unless they are prolonged. A person who has repeated seizures over time without a clear cause has a condition called epilepsy. What increases the risk? You are more likely to develop this condition if you have:  A family history of epilepsy.  Had a tonic-clonic seizure in the past. This is a type of seizure that involves whole-body contraction of muscles and a loss of consciousness.  Autism, cerebral palsy, or other brain disorders.  A history of head trauma, lack of oxygen at birth, or strokes. What are the signs or symptoms? There are many different types of seizures. The symptoms of a seizure vary depending on the type of seizure you have. Examples of symptoms during a seizure include:  Uncontrollable shaking (convulsions).  Stiffening of the body.  Loss of consciousness.  Head nodding.  Staring.  Not responding to sound or touch.  Loss of  bladder or bowel control. Some people have symptoms right before a seizure happens (aura) and right after a seizure happens (postictal). Symptoms before a seizure may include:  Fear or anxiety.  Nausea.  Feeling like the room is spinning (vertigo).  A feeling of having seen or heard something before (dj vu).  Odd tastes or smells.  Changes in vision, such as seeing flashing lights or spots. Symptoms after a seizure may include:  Confusion.  Sleepiness.  Headache.  Weakness on one side of the body. How is this diagnosed? This condition may be diagnosed based on:  A description of your symptoms. Video of your seizures can be helpful.  Your medical history.  A physical exam. You may also have tests, including:  Blood tests.  CT scan.  MRI.  Electroencephalogram (EEG). This test measures electrical activity in the brain. An EEG can predict whether seizures will return (recur).  A spinal tap (also called a lumbar puncture). This is the removal and testing of fluid that surrounds the brain and spinal cord. How is this treated? Most seizures will stop on their  own in under 5 minutes, and no treatment is needed. Seizures that last longer than 5 minutes will usually need treatment. Treatment can include:  Medicines given through an IV.  Avoiding known triggers, such as medicines that you take for another condition.  Medicines to treat epilepsy (antiepileptics), if epilepsy caused your seizures.  Surgery to stop seizures, if you have epilepsy that does not respond to medicines. Follow these instructions at home: Medicines  Take over-the-counter and prescription medicines only as told by your health care provider.  Avoid any substances that may prevent your medicine from working properly, such as alcohol. Activity  Do not drive, swim, or do any other activities that would be dangerous if you had another seizure. Wait until your health care provider says it is safe  to do them.  If you live in the [place] check with your local DMV (department of motor vehicles) to find out about local driving laws. Each state has specific rules about when you can legally return to driving.  Get enough rest. Lack of sleep can make seizures more likely to occur. Educating others Teach friends and family what to do if you have a seizure. They should:  Lay you on the ground to prevent a fall.  Cushion your head and body.  Loosen any tight clothing around your neck.  Turn you on your side. If vomiting occurs, this helps keep your airway clear.  Not hold you down. Holding you down will not stop the seizure.  Not put anything into your mouth.  Know whether or not you need emergency care. For example, they should get help right away if you have a seizure that lasts longer than 5 minutes or have several seizures in a row.  Stay with you until you recover.  General instructions  Contact your health care provider each time you have a seizure.  Avoid anything that has ever triggered a seizure for you.  Keep a seizure diary. Record what you remember about each seizure, especially anything that might have triggered the seizure.  Keep all follow-up visits as told by your health care provider. This is important. Contact a health care provider if:  You have another seizure.  You have seizures more often.  Your seizure symptoms change.  You continue to have seizures with treatment.  You have symptoms of an infection or illness. This might increase your risk of having a seizure. Get help right away if:  You have a seizure that: ? Lasts longer than 5 minutes. ? Is different than previous seizures. ? Leaves you unable to speak or use a part of your body. ? Makes it harder to breathe.  You have: ? A seizure after a head injury. ? Multiple seizures in a row. ? Confusion or a severe headache right after a seizure.  You do not wake up immediately after a  seizure.  You injure yourself during a seizure. These symptoms may represent a serious problem that is an emergency. Do not wait to see if the symptoms will go away. Get medical help right away. Call your local emergency services (911 in the [place] Do not drive yourself to the hospital. Summary  Seizures are caused by abnormal electrical activity in the brain. The activity disrupts normal brain function and can cause various symptoms, such as convulsions, abnormal movements, or a change in consciousness.  There are many causes of seizures, including illnesses, medicines, genetic conditions, head injuries, strokes, tumors, substance abuse, or substance withdrawal.  Most seizures  will stop on their own in under 5 minutes. Seizures that last longer than 5 minutes are a medical emergency and require immediate treatment.  Many medicines are used to treat seizures. Take over-the-counter and prescription medicines only as told by your health care provider. This information is not intended to replace advice given to you by your health care provider. Make sure you discuss any questions you have with your health care provider. Document Revised: 08/20/2018 Document Reviewed: 08/20/2018 Elsevier Patient Education  [place].

## 2019-11-26 DIAGNOSIS — M545 Low back pain: Secondary | ICD-10-CM | POA: Diagnosis not present

## 2019-12-22 ENCOUNTER — Encounter: Payer: Self-pay | Admitting: Nurse Practitioner

## 2019-12-22 ENCOUNTER — Non-Acute Institutional Stay: Payer: Medicare HMO | Admitting: Nurse Practitioner

## 2019-12-22 DIAGNOSIS — N401 Enlarged prostate with lower urinary tract symptoms: Secondary | ICD-10-CM

## 2019-12-22 DIAGNOSIS — F5101 Primary insomnia: Secondary | ICD-10-CM

## 2019-12-22 DIAGNOSIS — I10 Essential (primary) hypertension: Secondary | ICD-10-CM | POA: Diagnosis not present

## 2019-12-22 DIAGNOSIS — I2510 Atherosclerotic heart disease of native coronary artery without angina pectoris: Secondary | ICD-10-CM | POA: Diagnosis not present

## 2019-12-22 DIAGNOSIS — G40209 Localization-related (focal) (partial) symptomatic epilepsy and epileptic syndromes with complex partial seizures, not intractable, without status epilepticus: Secondary | ICD-10-CM | POA: Diagnosis not present

## 2019-12-22 DIAGNOSIS — I5032 Chronic diastolic (congestive) heart failure: Secondary | ICD-10-CM | POA: Diagnosis not present

## 2019-12-22 DIAGNOSIS — I4891 Unspecified atrial fibrillation: Secondary | ICD-10-CM

## 2019-12-22 DIAGNOSIS — R69 Illness, unspecified: Secondary | ICD-10-CM | POA: Diagnosis not present

## 2019-12-22 DIAGNOSIS — M5441 Lumbago with sciatica, right side: Secondary | ICD-10-CM | POA: Diagnosis not present

## 2019-12-22 DIAGNOSIS — R3914 Feeling of incomplete bladder emptying: Secondary | ICD-10-CM

## 2019-12-22 DIAGNOSIS — K219 Gastro-esophageal reflux disease without esophagitis: Secondary | ICD-10-CM

## 2019-12-22 DIAGNOSIS — I2583 Coronary atherosclerosis due to lipid rich plaque: Secondary | ICD-10-CM

## 2019-12-22 NOTE — Progress Notes (Signed)
Location:   [place] Room Number: [place] of Service:  ALF [phone]) Provider:  Marda Stalker, Lennie Odor NP  [name], MD  Patient Care Team: [name], MD as PCP - General (Internal Medicine) Gatha Mayer, MD as Consulting Physician (Gastroenterology) [name], [name], MD as Consulting Physician (Cardiology) [name], MD as Attending Physician (Internal Medicine) Laural Eiland X, NP as Nurse Practitioner (Internal Medicine)  Extended Emergency Contact Information Primary Emergency Contact: Gotwalt,[name] Address: [address]          [place], [place] [address] [name] of [place] Phone: [phone] Mobile Phone: [phone] Relation: Daughter Secondary Emergency Contact: Reine,[name] Address: [place] [address] [place] Mobile Phone: [phone] Relation: Daughter  Code Status:  DNR Goals of care: Advanced Directive information Advanced Directives 09/28/2019  Does Patient Have a Medical Advance Directive? Yes  Type of Advance Directive Living will;Healthcare Power of Attorney  Does patient want to make changes to medical advance directive? No - Patient declined  Copy of [place] in Chart? Yes - validated most recent copy scanned in chart (See row information)  Pre-existing out of facility DNR order (yellow form or pink MOST form) -     Chief Complaint  Patient presents with  . Medical Management of Chronic Issues    HPI:  Pt is a 86 y.o. male seen today for medical management of chronic diseases.    Afib, heart rate is conrolled, not a candidate for anticoagulation,   CHF, EF 83-15%, grade 1 diastolic dysfunction, on Furosemide 40mg  qd  HTN, blood pressure is controled on amlodipine 5mg  qd, Lisinopril 40mg  qd.   Hx of CAD, hyperlipidemia, on Crestor  GERD, takes Omeprazole 20mg  qd.   Insomnia, stable, on Trazodone 25mg  qd   Chronic lower back pain/right  hip pain,  on Gabapentin 200mg  qd  OA, s/p left hip surgery, now chronic right hip pain, w/c helps, prn Tylenol, Methocarbamol 25mg  qam/daily prn  Seizure: takes Keppra 500mg  bid   Past Medical History:  Diagnosis Date  . Allergic rhinitis   . Amnesia   . Atherosclerotic heart disease of native coronary artery without angina pectoris   . BPH (benign prostatic hyperplasia)   . Bradycardia   . Chronic diastolic (congestive) heart failure ([place])   . Colon polyps   . Coronary artery disease    MODERATE  . Dizziness   . Edema of lower extremity   . Erectile dysfunction   . H. pylori infection    Hx of   . Hard of hearing   . Headache(784.0)   . History of colon polyps 2009   Dr. Orr/Colonoscopy -small cecal adenoma  . History of falling   . Hx of colonoscopy 2009   Dr Lajoyce Corners, hemorrhoids & polyps  . Hyperlipidemia   . Hypertension   . Hypertensive heart disease with heart failure ([place])   . Hypokalemia   . Internal hemorrhoids 2009   Dr. Lajoyce Corners Colonoscopy  . Metabolic encephalopathy   . Mitral valve regurgitation   . Muscle weakness (generalized)   . Pneumonia   . Primary generalized (osteo)arthritis   . Reflux esophagitis   . Sepsis ([place]) 11/04/2018  . Unspecified hemorrhoids   . Unsteadiness on feet   . Vitamin D deficiency    Past Surgical History:  Procedure Laterality Date  . APPENDECTOMY    . [place]  REVEALED MILD TO MODERATE IRREGULARITIES. HIS LEFT EVNTRICULAR SYSTOLIC FUNCTION REVEALED NORMAL EJECTION FRACTION WITH EF OF 70%  . CATARACT EXTRACTION, BILATERAL    . COLONOSCOPY  multiple  . fatty tumor removal     benign  . HEMORROIDECTOMY    . HIP ARTHROPLASTY Left 08/17/2013   Procedure: ARTHROPLASTY BIPOLAR HIP;  Surgeon: Gearlean Alf, MD;  Location: [facility];  Service: Orthopedics;  Laterality: Left;  . INGUINAL HERNIA REPAIR Bilateral 2006  . lower back surgery  2006  . NASAL SEPTUM SURGERY    . TONSILLECTOMY    . UPPER  GASTROINTESTINAL ENDOSCOPY      No Known Allergies  Allergies as of 12/22/2019   No Known Allergies     Medication List       Accurate as of December 22, 2019 11:59 PM. If you have any questions, ask your nurse or doctor.        STOP taking these medications   Zinc 50 MG Tabs Stopped by: Marciano Mundt X Jaslyn Bansal, NP     TAKE these medications   acetaminophen 500 MG tablet Commonly known as: TYLENOL Take 500 mg by mouth every 8 (eight) hours as needed for mild pain or headache.   amLODipine 5 MG tablet Commonly known as: NORVASC Take 5 mg by mouth daily.   Anusol-HC 2.5 % rectal cream Generic drug: hydrocortisone Place 1 application rectally daily as needed for hemorrhoids or anal itching (USE PERINEAL APPLICATOR). With perineal applicator   aspirin EC 81 MG tablet Take 81 mg by mouth daily.   Carboxymethylcellulose Sod PF 0.5 % Soln Place 1 drop into both eyes daily as needed.   Deep Sea Nasal Spray 0.65 % nasal spray Generic drug: sodium chloride Place 1 spray into the nose daily as needed for congestion (BOTH NOSTRILS).   DEXTRAN 70-HYPROMELLOSE OP Place 1 drop into both eyes daily as needed (for dryness).   furosemide 40 MG tablet Commonly known as: LASIX Take 40 mg by mouth daily.   gabapentin 100 MG capsule Commonly known as: NEURONTIN Take 200 mg by mouth at bedtime.   levETIRAcetam 500 MG tablet Commonly known as: KEPPRA Take 1 tablet (500 mg total) by mouth 2 (two) times daily.   lisinopril 40 MG tablet Commonly known as: ZESTRIL Take 40 mg by mouth daily.   omeprazole 20 MG capsule Commonly known as: PRILOSEC Take 20 mg by mouth daily.   Robaxin 500 MG tablet Generic drug: methocarbamol Take 250 mg by mouth See admin instructions. Take 250 mg by mouth in the morning and an additional 250 mg once a day as needed for muscle spasms   rosuvastatin 5 MG tablet Commonly known as: CRESTOR Take 5 mg by mouth daily.   Thera-M Tabs Take 1 tablet by mouth daily.    traZODone 50 MG tablet Commonly known as: DESYREL Take 0.5 tablets (25 mg total) by mouth at bedtime.   vitamin B-12 1000 MCG tablet Commonly known as: CYANOCOBALAMIN Take 1,000 mcg by mouth daily.       Review of Systems  Constitutional: Negative for fatigue, fever and unexpected weight change.  HENT: Positive for hearing loss. Negative for congestion and voice change.   Eyes: Negative for visual disturbance.  Respiratory: Negative for cough and shortness of breath.   Cardiovascular: Positive for leg swelling.  Gastrointestinal: Negative for abdominal pain, constipation and vomiting.  Genitourinary: Negative for difficulty urinating, dysuria and urgency.  Musculoskeletal: Positive for arthralgias, back pain and gait problem.  Lower back, right hip/thigh pain   Skin: Negative for color change.  Neurological: Negative for dizziness, seizures, speech difficulty and headaches.       Memory lapses. Tingling in toes.   Psychiatric/Behavioral: Negative for agitation, behavioral problems and sleep disturbance. The patient is not nervous/anxious.     Immunization History  Administered Date(s) Administered  . Influenza, High Dose Seasonal PF 03/19/2019, 03/19/2019  . Influenza-Unspecified 04/08/2016  . Moderna SARS-COVID-2 Vaccination 06/18/2019, 07/16/2019  . Pneumococcal Conjugate-13 06/02/2013  . Pneumococcal Polysaccharide-23 04/07/2001   Pertinent  Health Maintenance Due  Topic Date Due  . INFLUENZA VACCINE  01/15/2020  . PNA vac Low Risk Adult  Completed   Fall Risk  11/03/2017 07/11/2016  Falls in the past year? Yes Yes  Number falls in past yr: 2 or more 2 or more  Injury with Fall? Yes Yes  Comment - broke toe on right foot  Risk Factor Category  High Fall Risk -  Follow up Education provided;Falls prevention discussed -   Functional Status Survey:    Vitals:   12/22/19 1610  BP: (!) 144/70  Pulse: 70  Resp: 18  Temp: 98.4 F (36.9 C)  SpO2: 95%    Weight: 165 lb (74.8 kg)  Height: 5\' 7"  (3.235 m)   Body mass index is 25.84 kg/m. Physical Exam Vitals and nursing note reviewed.  Constitutional:      Appearance: Normal appearance.  HENT:     Head: Normocephalic and atraumatic.     Mouth/Throat:     Mouth: Mucous membranes are moist.  Eyes:     Extraocular Movements: Extraocular movements intact.     Conjunctiva/sclera: Conjunctivae normal.     Pupils: Pupils are equal, round, and reactive to light.  Cardiovascular:     Rate and Rhythm: Normal rate and regular rhythm.     Heart sounds: Murmur heard.   Pulmonary:     Breath sounds: No wheezing or rales.  Abdominal:     General: Bowel sounds are normal.     Palpations: Abdomen is soft.     Tenderness: There is no abdominal tenderness.  Musculoskeletal:     Cervical back: Normal range of motion and neck supple. No muscular tenderness.     Right lower leg: Edema present.     Left lower leg: Edema present.     Comments: Trace edema BLE  Skin:    General: Skin is warm and dry.     Comments: Chronic venous insufficiency skin changes BLE.   Neurological:     General: No focal deficit present.     Mental Status: He is alert. Mental status is at baseline.     Gait: Gait abnormal.     Comments: Oriented to person, place.   Psychiatric:        Mood and Affect: Mood normal.        Behavior: Behavior normal.        Thought Content: Thought content normal.        Judgment: Judgment normal.     Labs reviewed: Recent Labs    09/21/19 0219 09/21/19 0219 09/22/19 0333 09/23/19 0619 09/27/19 0000  NA 137   < > 135 137 136*  K 3.7   < > 3.9 4.3 4.1  CL 101   < > 101 101 101  CO2 25   < > 23 25 29*  GLUCOSE 117*  --  97 107*  --   BUN 11   < > 11 18 15  CREATININE 0.97   < > 0.92 1.03 0.9  CALCIUM 9.0   < > 8.8* 9.0 8.9   < > = values in this interval not displayed.   Recent Labs    03/01/19 0000 09/18/19 2256 09/27/19 0000  AST 17 39 20  ALT 16 27 23   ALKPHOS  57 71 60  BILITOT  --  0.8  --   PROT 6.2 6.3*  --   ALBUMIN 3.9 3.8 3.4*   Recent Labs    09/21/19 0219 09/21/19 0219 09/22/19 0333 09/23/19 0857 09/27/19 0000  WBC 9.7   < > 9.5 8.8 10.8  NEUTROABS 5.8   < > 5.9 5.8 7,096  HGB 14.0   < > 14.1 15.5 13.4*  HCT 41.2   < > 41.8 46.2 39*  MCV 96.5  --  95.9 96.7  --   PLT 196   < > 208 260 230   < > = values in this interval not displayed.   Lab Results  Component Value Date   TSH 1.504 11/01/2018   No results found for: HGBA1C Lab Results  Component Value Date   CHOL 121 06/28/2019   HDL 46 06/28/2019   LDLCALC 61 06/28/2019   TRIG 62 06/28/2019   CHOLHDL 2.7 02/17/2017    Significant Diagnostic Results in last 30 days:  No results found.  Assessment/Plan Chronic diastolic heart failure (HCC) EF 83-15%, grade 1 diastolic dysfunction, trace edema BLE, continue  Furosemide 40mg  qd   Hypertension blood pressure is controled, continue Amlodipine 5mg  qd, Lisinopril 40mg  qd.   CAD (coronary artery disease) Stable, continue Crestor.   New onset atrial fibrillation (HCC)  heart rate is conrolled, not a candidate for anticoagulation   GERD (gastroesophageal reflux disease) Stable, continue Omeprazole.   Complex partial seizure with impairment of consciousness at onset [facility]) Stable, continue Keppra  BPH (benign prostatic hyperplasia) Stable, no urinary retention, 03/21/19 Urology [facility] BPH with urinary obstruction, chronic cystitis.   Insomnia Stable, continue Trazodone.   Lower back pain Lower back travels to the right hip/leg, continue Gabapentin.      Family/ staff Communication: plan of care reviewed with the patient and charge nurse.   Labs/tests ordered:  none  Time spend 40 minutes.

## 2019-12-23 ENCOUNTER — Encounter: Payer: Self-pay | Admitting: Nurse Practitioner

## 2019-12-23 NOTE — Assessment & Plan Note (Signed)
Stable, continue Crestor.

## 2019-12-23 NOTE — Assessment & Plan Note (Signed)
Stable, continue Trazodone.

## 2019-12-23 NOTE — Assessment & Plan Note (Signed)
heart rate is conrolled, not a candidate for anticoagulation

## 2019-12-23 NOTE — Assessment & Plan Note (Signed)
EF 83-75%, grade 1 diastolic dysfunction, trace edema BLE, continue  Furosemide 40mg  qd

## 2019-12-23 NOTE — Assessment & Plan Note (Signed)
blood pressure is controled, continue Amlodipine 5mg  qd, Lisinopril 40mg  qd.

## 2019-12-23 NOTE — Assessment & Plan Note (Signed)
Stable, continue Omeprazole.  

## 2019-12-23 NOTE — Assessment & Plan Note (Signed)
Lower back travels to the right hip/leg, continue Gabapentin.

## 2019-12-23 NOTE — Assessment & Plan Note (Addendum)
Stable, no urinary retention, 03/21/19 Urology [facility] BPH with urinary obstruction, chronic cystitis.

## 2019-12-23 NOTE — Assessment & Plan Note (Signed)
Stable, continue Keppra 

## 2019-12-26 DIAGNOSIS — M545 Low back pain: Secondary | ICD-10-CM | POA: Diagnosis not present

## 2019-12-27 ENCOUNTER — Encounter: Payer: Self-pay | Admitting: Internal Medicine

## 2019-12-27 ENCOUNTER — Non-Acute Institutional Stay: Payer: Medicare HMO | Admitting: Internal Medicine

## 2019-12-27 DIAGNOSIS — I2510 Atherosclerotic heart disease of native coronary artery without angina pectoris: Secondary | ICD-10-CM | POA: Diagnosis not present

## 2019-12-27 DIAGNOSIS — R569 Unspecified convulsions: Secondary | ICD-10-CM | POA: Diagnosis not present

## 2019-12-27 DIAGNOSIS — I4891 Unspecified atrial fibrillation: Secondary | ICD-10-CM

## 2019-12-27 DIAGNOSIS — I2583 Coronary atherosclerosis due to lipid rich plaque: Secondary | ICD-10-CM | POA: Diagnosis not present

## 2019-12-27 DIAGNOSIS — I10 Essential (primary) hypertension: Secondary | ICD-10-CM | POA: Diagnosis not present

## 2019-12-27 DIAGNOSIS — I5032 Chronic diastolic (congestive) heart failure: Secondary | ICD-10-CM | POA: Diagnosis not present

## 2019-12-27 NOTE — Progress Notes (Signed)
Location:  [facility] Room Number: 907-A Place of Service:  ALF [phone]) Provider:  [name], MD  Patient Care Team: [name], MD as PCP - General (Internal Medicine) Gatha Mayer, MD as Consulting Physician (Gastroenterology) [name], [name], MD as Consulting Physician (Cardiology) [name], MD as Attending Physician (Internal Medicine) Mast, Man X, NP as Nurse Practitioner (Internal Medicine)  Extended Emergency Contact Information Primary Emergency Contact: Eves,[name] Address: [address]          [place], [place] [address] [name] of [place] Phone: [phone] Mobile Phone: [phone] Relation: Daughter Secondary Emergency Contact: Rode,[name] Address: [place] [address] [place] Mobile Phone: [phone] Relation: Daughter  Code Status:  DNR Goals of care: Advanced Directive information Advanced Directives 12/27/2019  Does Patient Have a Medical Advance Directive? Yes  Type of Paramedic of [place];Living will  Does patient want to make changes to medical advance directive? No - Patient declined  Copy of [place] in Chart? Yes - validated most recent copy scanned in chart (See row information)  Pre-existing out of facility DNR order (yellow form or pink MOST form) -     Chief Complaint  Patient presents with  . Acute Visit    Patient is seen for Lasix dose adjustment    HPI:  Pt is a 86 y.o. male seen today for an acute visit for 'My Lasix Dose needs to be changed" Per Patient  Patient has a history of BPH, bradycardia, CAD, hypertension, diastolic CHF, GERD,H/o Left Sphenoid MeningiomaAnd Chronic Right Hip Pain And recent diagnosis of Seizures  He wanted to see me today as he is upset about his Lasix dose Patient has been on Lasix 40 mg daily for many years for his lower extremity edema .  Per patient he  is going to the bathroom a lot and wants to know if he can decrease the dose of Lasix. Patient does not have any cough or shortness of breath. His weight is stable and blood pressure is good   Past Medical History:  Diagnosis Date  . Allergic rhinitis   . Amnesia   . Atherosclerotic heart disease of native coronary artery without angina pectoris   . BPH (benign prostatic hyperplasia)   . Bradycardia   . Chronic diastolic (congestive) heart failure ([place])   . Colon polyps   . Coronary artery disease    MODERATE  . Dizziness   . Edema of lower extremity   . Erectile dysfunction   . H. pylori infection    Hx of   . Hard of hearing   . Headache(784.0)   . History of colon polyps 2009   Dr. Orr/Colonoscopy -small cecal adenoma  . History of falling   . Hx of colonoscopy 2009   Dr Lajoyce Corners, hemorrhoids & polyps  . Hyperlipidemia   . Hypertension   . Hypertensive heart disease with heart failure ([place])   . Hypokalemia   . Internal hemorrhoids 2009   Dr. Lajoyce Corners Colonoscopy  . Metabolic encephalopathy   . Mitral valve regurgitation   . Muscle weakness (generalized)   . Pneumonia   . Primary generalized (osteo)arthritis   . Reflux esophagitis   . Sepsis ([place]) 11/04/2018  . Unspecified hemorrhoids   . Unsteadiness on feet   . Vitamin D deficiency    Past Surgical History:  Procedure Laterality Date  .  APPENDECTOMY    . CARDIAC CATHETERIZATION  1997   REVEALED MILD TO MODERATE IRREGULARITIES. HIS LEFT EVNTRICULAR SYSTOLIC FUNCTION REVEALED NORMAL EJECTION FRACTION WITH EF OF 70%  . CATARACT EXTRACTION, BILATERAL    . COLONOSCOPY  multiple  . fatty tumor removal     benign  . HEMORROIDECTOMY    . HIP ARTHROPLASTY Left 08/17/2013   Procedure: ARTHROPLASTY BIPOLAR HIP;  Surgeon: Gearlean Alf, MD;  Location: [facility];  Service: Orthopedics;  Laterality: Left;  . INGUINAL HERNIA REPAIR Bilateral 2006  . lower back surgery  2006  . NASAL SEPTUM SURGERY    . TONSILLECTOMY    . UPPER  GASTROINTESTINAL ENDOSCOPY      No Known Allergies  Outpatient Encounter Medications as of 12/27/2019  Medication Sig  . acetaminophen (TYLENOL) 500 MG tablet Take 500 mg by mouth every 8 (eight) hours as needed for mild pain or headache.   Marland Kitchen amLODipine (NORVASC) 5 MG tablet Take 5 mg by mouth daily.  Marland Kitchen aspirin EC 81 MG tablet Take 81 mg by mouth daily.  . Carboxymethylcellulose Sod PF 0.5 % SOLN Place 1 drop into both eyes daily as needed.  . furosemide (LASIX) 40 MG tablet Take 40 mg by mouth daily.   Marland Kitchen gabapentin (NEURONTIN) 100 MG capsule Take 200 mg by mouth at bedtime.  . hydrocortisone (ANUSOL-HC) 2.5 % rectal cream Place 1 application rectally daily as needed for hemorrhoids or anal itching (USE PERINEAL APPLICATOR). With perineal applicator   . levETIRAcetam (KEPPRA) 500 MG tablet Take 1 tablet (500 mg total) by mouth 2 (two) times daily.  Marland Kitchen lisinopril (ZESTRIL) 40 MG tablet Take 40 mg by mouth daily.  . methocarbamol (ROBAXIN) 500 MG tablet Take 250 mg by mouth See admin instructions. Take 250 mg by mouth in the morning and an additional 250 mg once a day as needed for muscle spasms  . Multiple Vitamins-Minerals (THERA-M) TABS Take 1 tablet by mouth daily.  Marland Kitchen omeprazole (PRILOSEC) 20 MG capsule Take 20 mg by mouth daily.  . rosuvastatin (CRESTOR) 5 MG tablet Take 5 mg by mouth daily.   . sodium chloride (DEEP SEA NASAL SPRAY) 0.65 % nasal spray Place 1 spray into the nose daily as needed for congestion (BOTH NOSTRILS).   Marland Kitchen traZODone (DESYREL) 50 MG tablet Take 0.5 tablets (25 mg total) by mouth at bedtime.  . vitamin B-12 (CYANOCOBALAMIN) 1000 MCG tablet Take 1,000 mcg by mouth daily.  . [DISCONTINUED] DEXTRAN 70-HYPROMELLOSE OP Place 1 drop into both eyes daily as needed (for dryness).    No facility-administered encounter medications on file as of 12/27/2019.    Review of Systems  Constitutional: Negative.   HENT: Negative.   Respiratory: Negative for cough and shortness of  breath.   Cardiovascular: Positive for leg swelling.  Gastrointestinal: Negative.   Genitourinary: Positive for frequency.  Musculoskeletal: Positive for gait problem and myalgias.  Skin: Negative.   Neurological: Negative for dizziness.  Psychiatric/Behavioral: Negative.     Immunization History  Administered Date(s) Administered  . Influenza, High Dose Seasonal PF 03/19/2019, 03/19/2019  . Influenza-Unspecified 04/08/2016  . Moderna SARS-COVID-2 Vaccination 06/18/2019, 07/16/2019  . Pneumococcal Conjugate-13 06/02/2013  . Pneumococcal Polysaccharide-23 04/07/2001   Pertinent  Health Maintenance Due  Topic Date Due  . INFLUENZA VACCINE  01/15/2020  . PNA vac Low Risk Adult  Completed   Fall Risk  11/03/2017 07/11/2016  Falls in the past year? Yes Yes  Number falls in past yr: 2 or more 2  or more  Injury with Fall? Yes Yes  Comment - broke toe on right foot  Risk Factor Category  High Fall Risk -  Follow up Education provided;Falls prevention discussed -   Functional Status Survey:    Vitals:   12/27/19 1418  BP: 126/74  Pulse: 66  Resp: 18  Temp: 98 F (36.7 C)  TempSrc: Oral  SpO2: 96%  Weight: 164 lb (74.4 kg)  Height: 5\' 7"  (1.702 m)   Body mass index is 25.69 kg/m. Physical Exam Vitals reviewed.  Constitutional:      Appearance: Normal appearance.  HENT:     Head: Normocephalic.     Nose: Nose normal.     Mouth/Throat:     Mouth: Mucous membranes are moist.     Pharynx: Oropharynx is clear.  Eyes:     Pupils: Pupils are equal, round, and reactive to light.  Cardiovascular:     Rate and Rhythm: Normal rate and regular rhythm.     Pulses: Normal pulses.  Pulmonary:     Effort: Pulmonary effort is normal. No respiratory distress.     Breath sounds: Normal breath sounds. No wheezing.  Abdominal:     General: Abdomen is flat. Bowel sounds are normal.     Palpations: Abdomen is soft.  Musculoskeletal:     Cervical back: Neck supple.     Comments:  Has mild edema and chronic venous changes bilateral  Skin:    General: Skin is warm.  Neurological:     General: No focal deficit present.     Mental Status: He is alert and oriented to person, place, and time.  Psychiatric:        Mood and Affect: Mood normal.        Thought Content: Thought content normal.     Labs reviewed: Recent Labs    09/21/19 0219 09/21/19 0219 09/22/19 0333 09/23/19 0619 09/27/19 0000  NA 137   < > 135 137 136*  K 3.7   < > 3.9 4.3 4.1  CL 101   < > 101 101 101  CO2 25   < > 23 25 29*  GLUCOSE 117*  --  97 107*  --   BUN 11   < > 11 18 15   CREATININE 0.97   < > 0.92 1.03 0.9  CALCIUM 9.0   < > 8.8* 9.0 8.9   < > = values in this interval not displayed.   Recent Labs    03/01/19 0000 09/18/19 2256 09/27/19 0000  AST 17 39 20  ALT 16 27 23   ALKPHOS 57 71 60  BILITOT  --  0.8  --   PROT 6.2 6.3*  --   ALBUMIN 3.9 3.8 3.4*   Recent Labs    09/21/19 0219 09/21/19 0219 09/22/19 0333 09/23/19 0857 09/27/19 0000  WBC 9.7   < > 9.5 8.8 10.8  NEUTROABS 5.8   < > 5.9 5.8 7,096  HGB 14.0   < > 14.1 15.5 13.4*  HCT 41.2   < > 41.8 46.2 39*  MCV 96.5  --  95.9 96.7  --   PLT 196   < > 208 260 230   < > = values in this interval not displayed.   Lab Results  Component Value Date   TSH 1.504 11/01/2018   No results found for: HGBA1C Lab Results  Component Value Date   CHOL 121 06/28/2019   HDL 46 06/28/2019   LDLCALC 61 06/28/2019  TRIG 62 06/28/2019   CHOLHDL 2.7 02/17/2017    Significant Diagnostic Results in last 30 days:  No results found.  Assessment/Plan  Chronic diastolic heart failure ([place]) Patient seems to be doing well with no shortness of breath We will decrease his Lasix to 20 mg daily Repeat BMP in 1 week  Essential hypertension Blood pressure is controlled on lisinopril and amlodipine Coronary artery disease due to lipid rich plaque On aspirin and statin New onset atrial fibrillation (H[facility]) Was not started  on anticoagulation due to his frailty Seizure ([place]) Continues to be stable on Keppra Hyperlipidemia On Crestor repeat lipid panel  Right hip Pain On Tylenol and Robaxin PRN Also on Neurontin Gait abnormality Continues to be Wheelchair dependent Working with therapy   Family/ staff Communication:   Labs/tests ordered:  CBC,BMP,Lipid Panel, TSH

## 2020-01-03 DIAGNOSIS — J189 Pneumonia, unspecified organism: Secondary | ICD-10-CM | POA: Diagnosis not present

## 2020-01-03 DIAGNOSIS — I10 Essential (primary) hypertension: Secondary | ICD-10-CM | POA: Diagnosis not present

## 2020-01-03 LAB — BASIC METABOLIC PANEL
BUN: 13 (ref 4–21)
CO2: 27 — AB (ref 13–22)
Chloride: 100 (ref 99–108)
Creatinine: 0.9 (ref 0.6–1.3)
Glucose: 92
Potassium: 4.1 (ref 3.4–5.3)
Sodium: 134 — AB (ref 137–147)

## 2020-01-03 LAB — CBC AND DIFFERENTIAL
HCT: 40 — AB (ref 41–53)
Hemoglobin: 14 (ref 13.5–17.5)
Neutrophils Absolute: 3440
Platelets: 188 (ref 150–399)
WBC: 6.1

## 2020-01-03 LAB — TSH: TSH: 2.88 (ref 0.41–5.90)

## 2020-01-03 LAB — COMPREHENSIVE METABOLIC PANEL: Calcium: 2.9 — AB (ref 8.7–10.7)

## 2020-01-03 LAB — LIPID PANEL
Cholesterol: 120 (ref 0–200)
HDL: 51 (ref 35–70)
LDL Cholesterol: 55
LDl/HDL Ratio: 2.4
Triglycerides: 60 (ref 40–160)

## 2020-01-03 LAB — CBC: RBC: 4.41 (ref 3.87–5.11)

## 2020-01-12 ENCOUNTER — Encounter (HOSPITAL_COMMUNITY): Payer: Self-pay

## 2020-01-12 ENCOUNTER — Emergency Department (HOSPITAL_COMMUNITY)
Admission: EM | Admit: 2020-01-12 | Discharge: 2020-01-12 | Disposition: A | Discharge status: Home / Self Care | Payer: Medicare HMO | Attending: Emergency Medicine | Admitting: Emergency Medicine

## 2020-01-12 ENCOUNTER — Other Ambulatory Visit: Payer: Self-pay

## 2020-01-12 ENCOUNTER — Emergency Department (HOSPITAL_COMMUNITY): Payer: Medicare HMO

## 2020-01-12 DIAGNOSIS — Z87891 Personal history of nicotine dependence: Secondary | ICD-10-CM | POA: Insufficient documentation

## 2020-01-12 DIAGNOSIS — R52 Pain, unspecified: Secondary | ICD-10-CM | POA: Diagnosis not present

## 2020-01-12 DIAGNOSIS — I110 Hypertensive heart disease with heart failure: Secondary | ICD-10-CM | POA: Diagnosis not present

## 2020-01-12 DIAGNOSIS — S0990XA Unspecified injury of head, initial encounter: Secondary | ICD-10-CM | POA: Diagnosis not present

## 2020-01-12 DIAGNOSIS — I5032 Chronic diastolic (congestive) heart failure: Secondary | ICD-10-CM | POA: Insufficient documentation

## 2020-01-12 DIAGNOSIS — M79604 Pain in right leg: Secondary | ICD-10-CM | POA: Diagnosis not present

## 2020-01-12 DIAGNOSIS — W19XXXA Unspecified fall, initial encounter: Secondary | ICD-10-CM | POA: Insufficient documentation

## 2020-01-12 DIAGNOSIS — R404 Transient alteration of awareness: Secondary | ICD-10-CM | POA: Diagnosis not present

## 2020-01-12 DIAGNOSIS — R569 Unspecified convulsions: Secondary | ICD-10-CM | POA: Diagnosis not present

## 2020-01-12 DIAGNOSIS — I959 Hypotension, unspecified: Secondary | ICD-10-CM | POA: Diagnosis not present

## 2020-01-12 DIAGNOSIS — Z79899 Other long term (current) drug therapy: Secondary | ICD-10-CM | POA: Insufficient documentation

## 2020-01-12 DIAGNOSIS — N390 Urinary tract infection, site not specified: Secondary | ICD-10-CM | POA: Diagnosis not present

## 2020-01-12 DIAGNOSIS — G936 Cerebral edema: Secondary | ICD-10-CM | POA: Diagnosis not present

## 2020-01-12 DIAGNOSIS — I2510 Atherosclerotic heart disease of native coronary artery without angina pectoris: Secondary | ICD-10-CM | POA: Diagnosis not present

## 2020-01-12 DIAGNOSIS — Y939 Activity, unspecified: Secondary | ICD-10-CM | POA: Insufficient documentation

## 2020-01-12 DIAGNOSIS — Y92009 Unspecified place in unspecified non-institutional (private) residence as the place of occurrence of the external cause: Secondary | ICD-10-CM | POA: Diagnosis not present

## 2020-01-12 DIAGNOSIS — Y999 Unspecified external cause status: Secondary | ICD-10-CM | POA: Diagnosis not present

## 2020-01-12 DIAGNOSIS — Z7401 Bed confinement status: Secondary | ICD-10-CM | POA: Diagnosis not present

## 2020-01-12 DIAGNOSIS — I10 Essential (primary) hypertension: Secondary | ICD-10-CM | POA: Diagnosis not present

## 2020-01-12 DIAGNOSIS — R5381 Other malaise: Secondary | ICD-10-CM | POA: Diagnosis not present

## 2020-01-12 DIAGNOSIS — M2550 Pain in unspecified joint: Secondary | ICD-10-CM | POA: Diagnosis not present

## 2020-01-12 DIAGNOSIS — G9389 Other specified disorders of brain: Secondary | ICD-10-CM | POA: Diagnosis not present

## 2020-01-12 LAB — I-STAT CHEM 8, ED
BUN: 13 mg/dL (ref 8–23)
Calcium, Ion: 1.22 mmol/L (ref 1.15–1.40)
Chloride: 95 mmol/L — ABNORMAL LOW (ref 98–111)
Creatinine, Ser: 0.9 mg/dL (ref 0.61–1.24)
Glucose, Bld: 131 mg/dL — ABNORMAL HIGH (ref 70–99)
HCT: 41 % (ref 39.0–52.0)
Hemoglobin: 13.9 g/dL (ref 13.0–17.0)
Potassium: 3.7 mmol/L (ref 3.5–5.1)
Sodium: 134 mmol/L — ABNORMAL LOW (ref 135–145)
TCO2: 24 mmol/L (ref 22–32)

## 2020-01-12 LAB — CBC WITH DIFFERENTIAL/PLATELET
Abs Immature Granulocytes: 0.04 10*3/uL (ref 0.00–0.07)
Basophils Absolute: 0 10*3/uL (ref 0.0–0.1)
Basophils Relative: 0 %
Eosinophils Absolute: 0 10*3/uL (ref 0.0–0.5)
Eosinophils Relative: 0 %
HCT: 40.7 % (ref 39.0–52.0)
Hemoglobin: 14.1 g/dL (ref 13.0–17.0)
Immature Granulocytes: 0 %
Lymphocytes Relative: 5 %
Lymphs Abs: 0.6 10*3/uL — ABNORMAL LOW (ref 0.7–4.0)
MCH: 32.3 pg (ref 26.0–34.0)
MCHC: 34.6 g/dL (ref 30.0–36.0)
MCV: 93.3 fL (ref 80.0–100.0)
Monocytes Absolute: 0.9 10*3/uL (ref 0.1–1.0)
Monocytes Relative: 8 %
Neutro Abs: 10 10*3/uL — ABNORMAL HIGH (ref 1.7–7.7)
Neutrophils Relative %: 87 %
Platelets: 174 10*3/uL (ref 150–400)
RBC: 4.36 MIL/uL (ref 4.22–5.81)
RDW: 12.2 % (ref 11.5–15.5)
WBC: 11.6 10*3/uL — ABNORMAL HIGH (ref 4.0–10.5)
nRBC: 0 % (ref 0.0–0.2)

## 2020-01-12 LAB — URINALYSIS, ROUTINE W REFLEX MICROSCOPIC
Bilirubin Urine: NEGATIVE
Glucose, UA: NEGATIVE mg/dL
Ketones, ur: NEGATIVE mg/dL
Nitrite: POSITIVE — AB
Protein, ur: NEGATIVE mg/dL
Specific Gravity, Urine: 1.013 (ref 1.005–1.030)
WBC, UA: >50 WBC/hpf — ABNORMAL HIGH (ref 0–5)
pH: 7 (ref 5.0–8.0)

## 2020-01-12 MED ORDER — LEVETIRACETAM IN NACL 1000 MG/100ML IV SOLN
1000.0000 mg | Freq: Once | INTRAVENOUS | Status: AC
  Administered 2020-01-12: 1000 mg via INTRAVENOUS
  Filled 2020-01-12: qty 100

## 2020-01-12 MED ORDER — CEFTRIAXONE SODIUM 1 G IJ SOLR
1.0000 g | Freq: Once | INTRAMUSCULAR | Status: AC
  Administered 2020-01-12: 1 g via INTRAMUSCULAR
  Filled 2020-01-12: qty 10

## 2020-01-12 MED ORDER — CEPHALEXIN 500 MG PO CAPS: ORAL_CAPSULE | ORAL | 0 refills | Status: DC

## 2020-01-12 NOTE — ED Provider Notes (Addendum)
[facility] DEPT Provider Note   CSN: [id number] Arrival date & time: 01/12/20  0136     History Chief Complaint  Patient presents with  . Fall  . Seizures    possible    [name] [name] is a 86 y.o. male.  The history is provided by the patient.  Fall This is a new problem. The current episode started 1 to 2 hours ago. The problem occurs constantly. The problem has been resolved. Pertinent negatives include no chest pain, no abdominal pain, no headaches and no shortness of breath. Nothing aggravates the symptoms. Nothing relieves the symptoms. He has tried nothing for the symptoms. The treatment provided no relief.  Patient with known seizures. Nursing home stated the patient may have had an unwitnessed seizure vs. A fall.  No pain, no weakness no numbness. No bowel or bladder incontinence.  No f/c/r.       Past Medical History:  Diagnosis Date  . Allergic rhinitis   . Amnesia   . Atherosclerotic heart disease of native coronary artery without angina pectoris   . BPH (benign prostatic hyperplasia)   . Bradycardia   . Chronic diastolic (congestive) heart failure ([place])   . Colon polyps   . Coronary artery disease    MODERATE  . Dizziness   . Edema of lower extremity   . Erectile dysfunction   . H. pylori infection    Hx of   . Hard of hearing   . Headache(784.0)   . History of colon polyps 2009   Dr. Orr/Colonoscopy -small cecal adenoma  . History of falling   . Hx of colonoscopy 2009   Dr Lajoyce Corners, hemorrhoids & polyps  . Hyperlipidemia   . Hypertension   . Hypertensive heart disease with heart failure ([place])   . Hypokalemia   . Internal hemorrhoids 2009   Dr. Lajoyce Corners Colonoscopy  . Metabolic encephalopathy   . Mitral valve regurgitation   . Muscle weakness (generalized)   . Pneumonia   . Primary generalized (osteo)arthritis   . Reflux esophagitis   . Sepsis ([place]) 11/04/2018  . Unspecified hemorrhoids   . Unsteadiness on feet   .  Vitamin D deficiency     Patient Active Problem List   Diagnosis Date Noted  . Complex partial seizure with impairment of consciousness at onset [facility]) 11/02/2019  . Insomnia 09/28/2019  . GERD (gastroesophageal reflux disease) 09/26/2019  . Meningioma ([place]) 09/26/2019  . Seizure ([place]) 09/19/2019  . New onset atrial fibrillation ([place]) 09/19/2019  . UTI (urinary tract infection) 09/19/2019  . AKI (acute kidney injury) ([place]) 09/19/2019  . BPH (benign prostatic hyperplasia) 03/23/2019  . Gait abnormality 02/28/2019  . Fall 02/22/2019  . Anemia 02/03/2019  . Lower back pain 12/10/2018  . Right hip pain 11/26/2018  . Palliative care by specialist   . Acute metabolic encephalopathy 00/86/7619  . Right leg weakness 11/03/2017  . Bleeding internal hemorrhoids 10/28/2017  . Chronic diastolic heart failure ([place]) 08/17/2013  . Femur fracture, left ([place]) 08/16/2013  . Bradycardia, severe sinus 10/30/2011  . Personal history of colonic adenoma 01/09/2011  . Family history of malignant neoplasm of colon cancer-mother 01/09/2011  . CAD (coronary artery disease)   . Hyperlipidemia   . Hypertension     Past Surgical History:  Procedure Laterality Date  . APPENDECTOMY    . CARDIAC CATHETERIZATION  1997   REVEALED MILD TO MODERATE IRREGULARITIES. HIS LEFT EVNTRICULAR SYSTOLIC FUNCTION REVEALED NORMAL EJECTION FRACTION WITH EF OF 70%  .  CATARACT EXTRACTION, BILATERAL    . COLONOSCOPY  multiple  . fatty tumor removal     benign  . HEMORROIDECTOMY    . HIP ARTHROPLASTY Left 08/17/2013   Procedure: ARTHROPLASTY BIPOLAR HIP;  Surgeon: Gearlean Alf, MD;  Location: [facility];  Service: Orthopedics;  Laterality: Left;  . INGUINAL HERNIA REPAIR Bilateral 2006  . lower back surgery  2006  . NASAL SEPTUM SURGERY    . TONSILLECTOMY    . UPPER GASTROINTESTINAL ENDOSCOPY         Family History  Problem Relation Age of Onset  . Colon cancer Mother 85       second cancer in 39's deceased after  emergency surgery  . Breast cancer Sister   . Seizures Neg Hx     Social History   Tobacco Use  . Smoking status: Former Smoker    Quit date: 10/25/1975    Years since quitting: 44.2  . Smokeless tobacco: Never Used  Vaping Use  . Vaping Use: Never used  Substance Use Topics  . Alcohol use: Yes    Comment: occ 6-7 times a year  . Drug use: No    Home Medications Prior to Admission medications   Medication Sig Start Date End Date Taking? Authorizing Provider  acetaminophen (TYLENOL) 500 MG tablet Take 500 mg by mouth every 8 (eight) hours as needed for mild pain or headache.    Yes [provider]  amLODipine (NORVASC) 5 MG tablet Take 5 mg by mouth daily.   Yes [provider]  aspirin EC 81 MG tablet Take 81 mg by mouth daily.   Yes [provider]  Carboxymethylcellulose Sod PF 0.5 % SOLN Place 1 drop into both eyes daily as needed (dry eyes).    Yes [provider]  furosemide (LASIX) 40 MG tablet Take 20 mg by mouth daily.    Yes [provider]  gabapentin (NEURONTIN) 100 MG capsule Take 200 mg by mouth at bedtime. 02/11/19  Yes [provider]  hydrocortisone (ANUSOL-HC) 2.5 % rectal cream Place 1 application rectally daily as needed for hemorrhoids or anal itching (USE PERINEAL APPLICATOR). With perineal applicator    Yes [provider]  levETIRAcetam (KEPPRA) 500 MG tablet Take 1 tablet (500 mg total) by mouth 2 (two) times daily. 11/02/19  Yes Melvenia Beam, MD  lisinopril (ZESTRIL) 40 MG tablet Take 40 mg by mouth daily.   Yes [provider]  methocarbamol (ROBAXIN) 500 MG tablet Take 250 mg by mouth See admin instructions. Take 250 mg by mouth in the morning and an additional 250 mg once a day as needed for muscle spasms   Yes [provider]  Multiple Vitamins-Minerals (THERA-M) TABS Take 1 tablet by mouth daily.   Yes [provider]  omeprazole (PRILOSEC) 20 MG capsule Take 20  mg by mouth daily.   Yes [provider]  rosuvastatin (CRESTOR) 5 MG tablet Take 5 mg by mouth daily.    Yes [provider]  sodium chloride (DEEP SEA NASAL SPRAY) 0.65 % nasal spray Place 1 spray into the nose daily as needed for congestion (BOTH NOSTRILS).    Yes [provider]  traZODone (DESYREL) 50 MG tablet Take 0.5 tablets (25 mg total) by mouth at bedtime. 11/04/18  Yes Aline August, MD  vitamin B-12 (CYANOCOBALAMIN) 1000 MCG tablet Take 1,000 mcg by mouth daily.   Yes [provider]    Allergies    Patient has no  known allergies.  Review of Systems   Review of Systems  Constitutional: Negative for fever.  HENT: Negative for congestion.   Eyes: Negative for visual disturbance.  Respiratory: Negative for shortness of breath.   Cardiovascular: Negative for chest pain.  Gastrointestinal: Negative for abdominal pain.  Genitourinary: Negative for difficulty urinating.  Musculoskeletal: Negative for arthralgias.  Neurological: Positive for seizures. Negative for weakness and headaches.  Psychiatric/Behavioral: Negative for agitation.  All other systems reviewed and are negative.   Physical Exam Updated Vital Signs BP (!) 174/60 (BP Location: Left Arm)   Pulse 74   Temp 99.2 F (37.3 C) (Oral)   Resp 19   Ht 5\' 7"  (1.702 m)   Wt 68.5 kg   SpO2 96%   BMI 23.65 kg/m   Physical Exam Vitals and nursing note reviewed.  Constitutional:      General: He is not in acute distress.    Appearance: Normal appearance.  HENT:     Head: Normocephalic and atraumatic.     Right Ear: Tympanic membrane normal.     Left Ear: Tympanic membrane normal.     Nose: Nose normal.  Eyes:     Conjunctiva/sclera: Conjunctivae normal.     Pupils: Pupils are equal, round, and reactive to light.  Cardiovascular:     Rate and Rhythm: Normal rate and regular rhythm.     Pulses: Normal pulses.     Heart sounds: Normal heart sounds.  Pulmonary:     Effort:  Pulmonary effort is normal.     Breath sounds: Normal breath sounds.  Abdominal:     General: Abdomen is flat. Bowel sounds are normal.     Tenderness: There is no abdominal tenderness. There is no guarding or rebound.  Musculoskeletal:        General: No swelling, tenderness or deformity. Normal range of motion.     Right wrist: Normal.     Left wrist: Normal.     Right hand: Normal.     Left hand: Normal.     Cervical back: Normal range of motion and neck supple.     Right hip: Normal.     Left hip: Normal.     Right knee: Normal.     Left knee: Normal.     Right lower leg: No edema.     Left lower leg: No edema.     Right ankle: Normal.     Right Achilles Tendon: Normal.     Left ankle: Normal.     Left Achilles Tendon: Normal.     Right foot: Normal.     Left foot: Normal.  Skin:    General: Skin is warm and dry.     Capillary Refill: Capillary refill takes less than 2 seconds.  Neurological:     General: No focal deficit present.     Mental Status: He is alert.     Deep Tendon Reflexes: Reflexes normal.  Psychiatric:        Mood and Affect: Mood normal.     ED Results / Procedures / Treatments   Labs (all labs ordered are listed, but only abnormal results are displayed) Results for orders placed or performed during the hospital encounter of 01/12/20  CBC with Differential/Platelet  Result Value Ref Range   WBC 11.6 (H) 4.0 - 10.5 K/uL   RBC 4.36 4.22 - 5.81 MIL/uL   Hemoglobin 14.1 13.0 - 17.0 g/dL   HCT 40.7 39 - 52 %   MCV 93.3 80.0 -  100.0 fL   MCH 32.3 26.0 - 34.0 pg   MCHC 34.6 30.0 - 36.0 g/dL   RDW 12.2 11.5 - 15.5 %   Platelets 174 150 - 400 K/uL   nRBC 0.0 0.0 - 0.2 %   Neutrophils Relative % 87 %   Neutro Abs 10.0 (H) 1.7 - 7.7 K/uL   Lymphocytes Relative 5 %   Lymphs Abs 0.6 (L) 0.7 - 4.0 K/uL   Monocytes Relative 8 %   Monocytes Absolute 0.9 0 - 1 K/uL   Eosinophils Relative 0 %   Eosinophils Absolute 0.0 0 - 0 K/uL   Basophils Relative 0 %    Basophils Absolute 0.0 0 - 0 K/uL   Immature Granulocytes 0 %   Abs Immature Granulocytes 0.04 0.00 - 0.07 K/uL  I-stat chem 8, ED (not at [facility] or [facility])  Result Value Ref Range   Sodium 134 (L) 135 - 145 mmol/L   Potassium 3.7 3.5 - 5.1 mmol/L   Chloride 95 (L) 98 - 111 mmol/L   BUN 13 8 - 23 mg/dL   Creatinine, Ser 0.90 0.61 - 1.24 mg/dL   Glucose, Bld 131 (H) 70 - 99 mg/dL   Calcium, Ion 1.22 1.15 - 1.40 mmol/L   TCO2 24 22 - 32 mmol/L   Hemoglobin 13.9 13.0 - 17.0 g/dL   HCT 41.0 39 - 52 %   No results found.  Radiology No results found.  Procedures Procedures (including critical care time)  Medications Ordered in ED Medications  cefTRIAXone (ROCEPHIN) injection 1 g (has no administration in time range)  levETIRAcetam (KEPPRA) IVPB 1000 mg/100 mL premix (1,000 mg Intravenous New Bag/Given 01/12/20 0301)    ED Course  I have reviewed the triage vital signs and the nursing notes.  Pertinent labs & imaging results that were available during my care of the patient were reviewed by me and considered in my medical decision making (see chart for details).  Well appearing, no signs of trauma.  Was reloaded with keppra. Patient found to have UTI in the ED, but is not septic.  Have initiated treatment and cultured urine.  Will D/c on 7 days of antibiotics.  Strict return precautions given.  Patient has no complaints.  Patient is stable for discharge to home with close follow up.   [name] [name] was evaluated in Emergency Department on 01/12/2020 for the symptoms described in the history of present illness. He was evaluated in the context of the global COVID-19 pandemic, which necessitated consideration that the patient might be at risk for infection with the SARS-CoV-2 virus that causes COVID-19. Institutional protocols and algorithms that pertain to the evaluation of patients at risk for COVID-19 are in a state of rapid change based on information released by regulatory bodies  including the CDC and federal and state organizations. These policies and algorithms were followed during the patient's care in the ED.  Final Clinical Impression(s) / ED Diagnoses Return for intractable cough, coughing up blood,fevers >100.4 unrelieved by medication, shortness of breath, intractable vomiting, chest pain, shortness of breath, weakness,numbness, changes in speech, facial asymmetry,abdominal pain, passing out,Inability to tolerate liquids or food, cough, altered mental status or any concerns. No signs of systemic illness or infection. The patient is nontoxic-appearing on exam and vital signs are within normal limits.   I have reviewed the triage vital signs and the nursing notes. Pertinent labs &imaging results that were available during my care of the patient were reviewed by me and considered  in my medical decision making (see chart for details).After history, exam, and medical workup I feel the patient has beenappropriately medically screened and is safe for discharge home. Pertinent diagnoses were discussed with the patient. Patient was given return precautions.      Osa Campoli, MD 01/12/20 [place], Shanan Fitzpatrick, MD 01/12/20 1292

## 2020-01-12 NOTE — ED Triage Notes (Signed)
Patient in from [facility], found on floor of facility, unwitnessed fall, no complaint of injuries, patient states he remembers falling, but facility thinks he had a seizure, h/o of same, also has been having some increased weakness per facility, Daughter wanted patient sent out to be evaluated.

## 2020-01-12 NOTE — ED Notes (Signed)
Patient waiting [facility]

## 2020-01-14 LAB — URINE CULTURE
Culture: >=100000 — AB
Special Requests: NORMAL

## 2020-01-15 ENCOUNTER — Telehealth: Payer: Self-pay | Admitting: Emergency Medicine

## 2020-01-15 NOTE — Telephone Encounter (Signed)
Post ED Visit - Positive Culture Follow-up: Successful Patient Follow-Up  Culture assessed and recommendations reviewed by:  []  Elenor Quinones, Pharm.D. []  Heide Guile, Pharm.D., BCPS AQ-ID []  Parks Neptune, Pharm.D., BCPS []  Alycia Rossetti, Pharm.D., BCPS []  [place], [place].D., BCPS, AAHIVP []  Legrand Como, Pharm.D., BCPS, AAHIVP []  Salome Arnt, PharmD, BCPS []  Johnnette Gourd, PharmD, BCPS []  Hughes Better, PharmD, BCPS [x]  Ulice Dash, PharmD  Positive urine culture  []  Patient discharged without antimicrobial prescription and treatment is now indicated [x]  Organism is resistant to prescribed ED discharge antimicrobial []  Patient with positive blood cultures  Changes discussed with ED provider: Providence Lanius PA New antibiotic prescription: Cipro 250 mg bid x five days Called and faxed to [facility] 01/15/2020 @ 1425  Phone [phone] Fax      [phone]   Milus Mallick 01/15/2020, 6:30 PM

## 2020-01-15 NOTE — Progress Notes (Signed)
ED Antimicrobial Stewardship Positive Culture Follow Up   [name] [name] is an 86 y.o. male who presented to [facility] on 01/12/2020 with a chief complaint of seizures and fall Chief Complaint  Patient presents with  . Fall  . Seizures    possible    Recent Results (from the past 720 hour(s))  Urine culture     Status: Abnormal   Collection Time: 01/12/20  2:36 AM   Specimen: Urine, Clean Catch  Result Value Ref Range Status   Specimen Description   Final    URINE, CLEAN CATCH Performed at [facility], [place] [address] [place], [place] [address]    Special Requests   Final    Normal Performed at [facility], [place] [address] [place], [place] [address]    Culture >=100,000 COLONIES/mL KLEBSIELLA OXYTOCA (A)  Final   Report Status 01/14/2020 FINAL  Final   Organism ID, Bacteria KLEBSIELLA OXYTOCA (A)  Final      Susceptibility   Klebsiella oxytoca - MIC*    AMPICILLIN >=32 RESISTANT Resistant     CEFAZOLIN >=64 RESISTANT Resistant     CEFTRIAXONE <=0.25 SENSITIVE Sensitive     CIPROFLOXACIN <=0.25 SENSITIVE Sensitive     GENTAMICIN <=1 SENSITIVE Sensitive     IMIPENEM <=0.25 SENSITIVE Sensitive     NITROFURANTOIN <=16 SENSITIVE Sensitive     TRIMETH/SULFA <=20 SENSITIVE Sensitive     AMPICILLIN/SULBACTAM 8 SENSITIVE Sensitive     PIP/TAZO <=4 SENSITIVE Sensitive     * >=100,000 COLONIES/mL KLEBSIELLA OXYTOCA    [x]  Treated with cephalexin, organism resistant to prescribed antimicrobial []  Patient discharged originally without antimicrobial agent and treatment is now indicated  New antibiotic prescription: Cipro 250 mg bid x 5d  ED Provider: Providence Lanius, [facility]   Ulice Dash D 01/15/2020, 11:49 AM Clinical Pharmacist [phone]

## 2020-01-28 IMAGING — CT CT CERVICAL SPINE W/O CM
3 series · 13 of 33 positions shown, 16 images · non-contrast
Comparison: 07/21/2017 MRI head.

CLINICAL DATA: [AGE]/o  M; fall, head trauma, neck pain, confusion.

EXAM:
CT HEAD WITHOUT CONTRAST
CT CERVICAL SPINE WITHOUT CONTRAST
TECHNIQUE: Multidetector CT imaging of the head and cervical spine was
performed following the standard protocol without intravenous
contrast. Multiplanar CT image reconstructions of the cervical spine
were also generated.

[Series 2: head wo · axial · 0.49mm/px · z∈[+259,+369]mm · 5 of 34 slices shown, 7 images]
[im 6/34  soft-tissue]
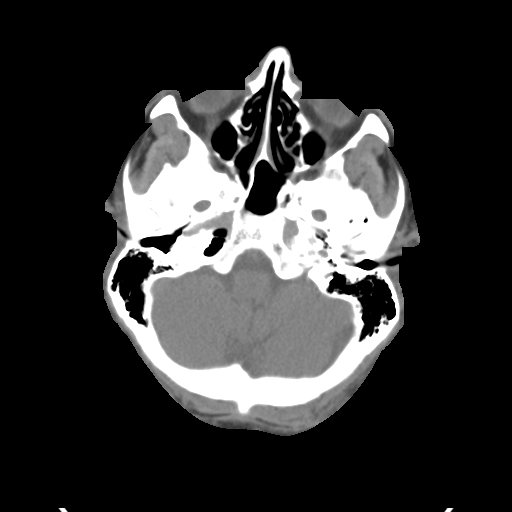
[im 6/34  bone]
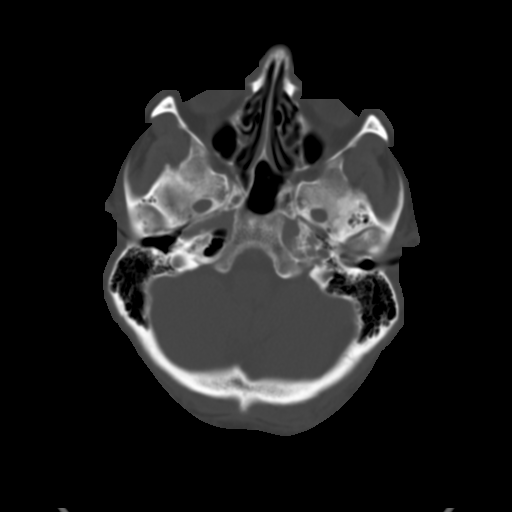
[im 11/34  bone]
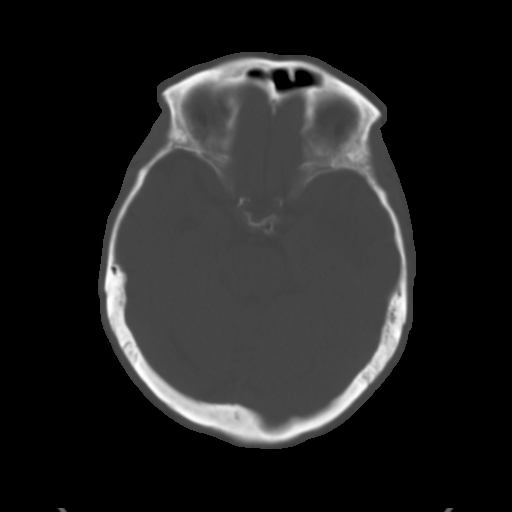
[im 18/34  bone]
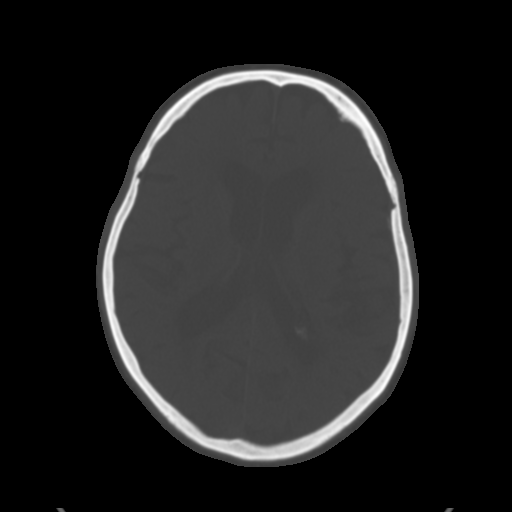
[im 23/34  bone]
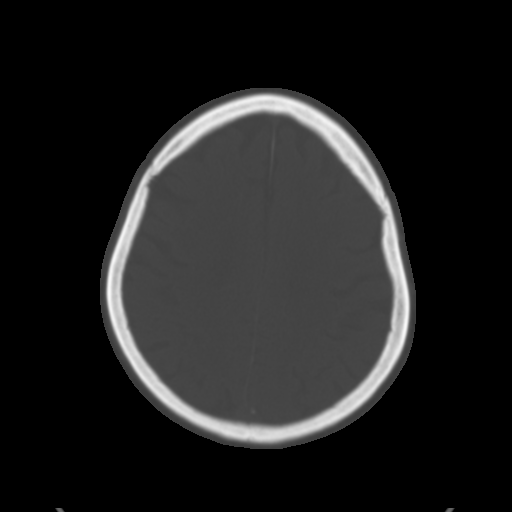
[im 28/34  soft-tissue]
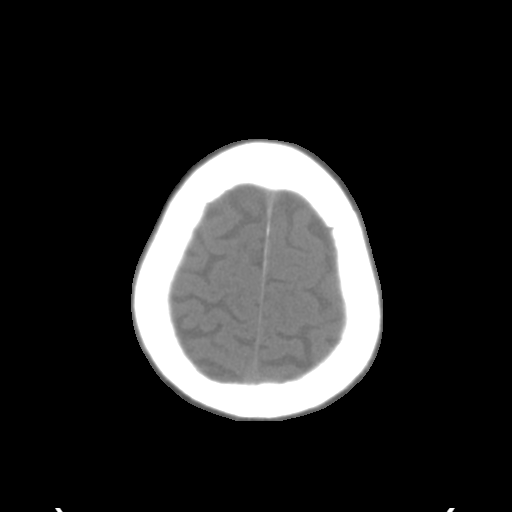
[im 28/34  bone]
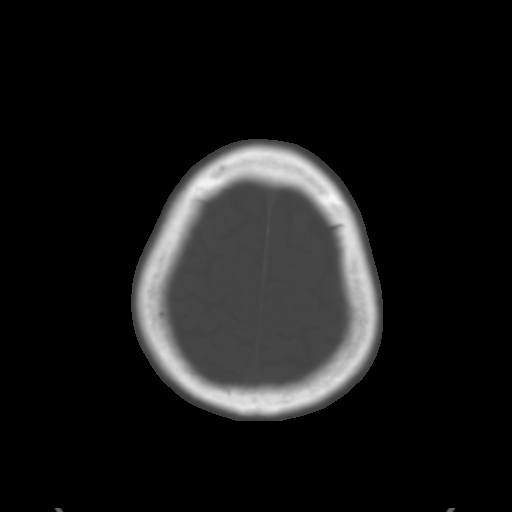

[Series 7: coronal soft tissue · coronal · 0.33mm/px · 3 of 84 slices shown]
[im 17/84  bone]
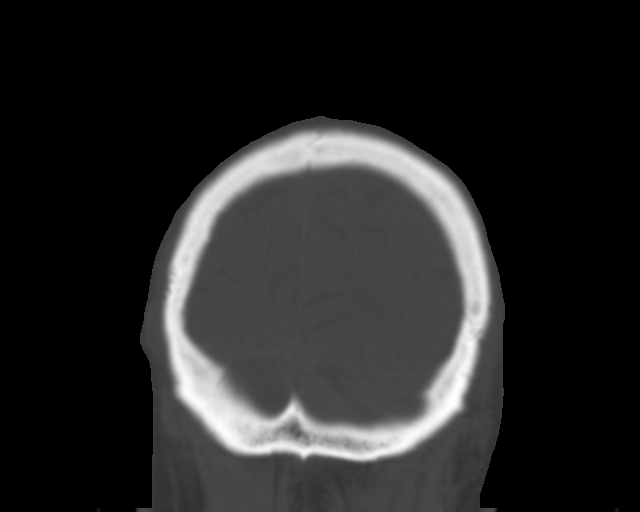
[im 34/84  bone]
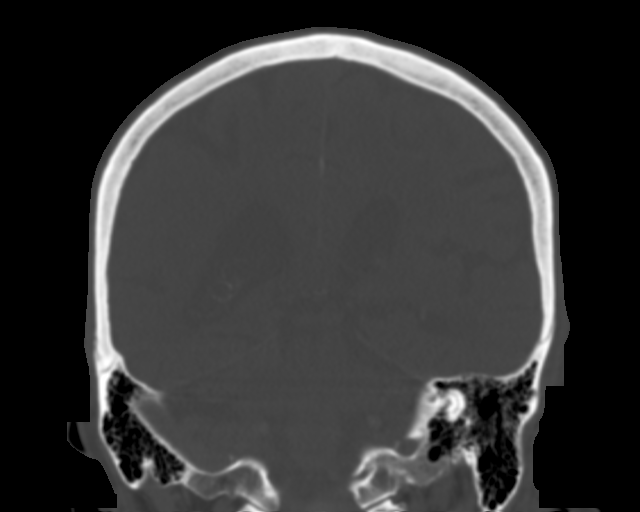
[im 50/84  bone]
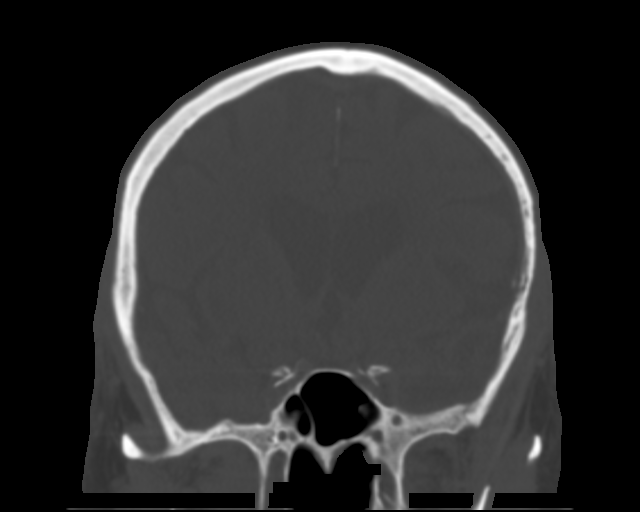

[Series 8: sagittal soft tissue · sagittal · 0.33mm/px · 5 of 62 slices shown, 6 images]
[im 21/62  bone]
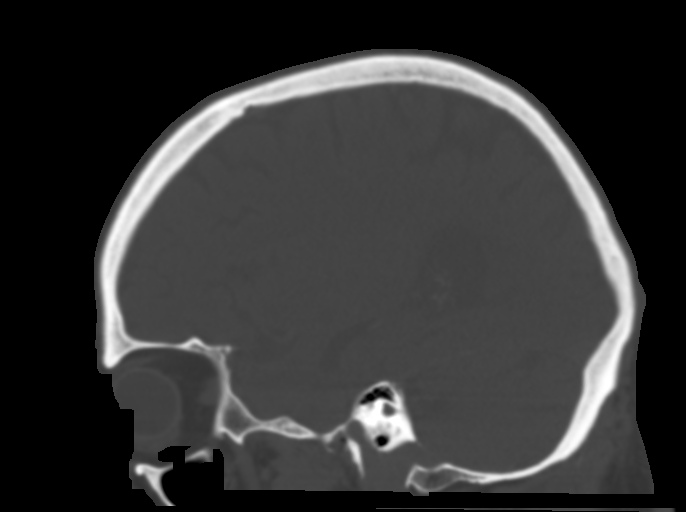
[im 26/62  bone]
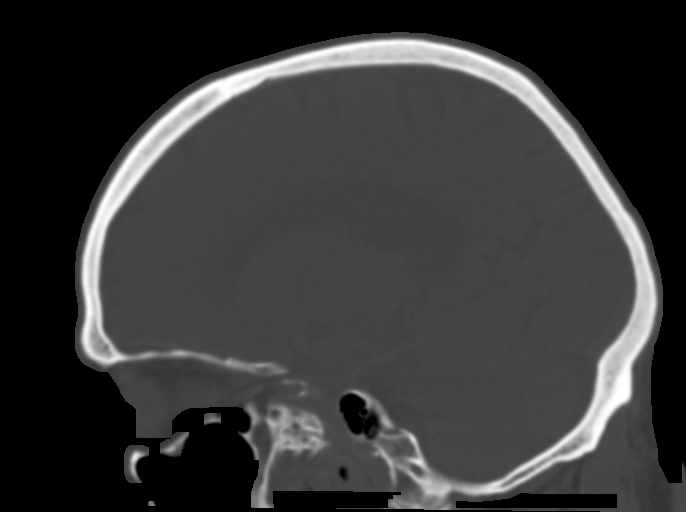
[im 31/62  soft-tissue]
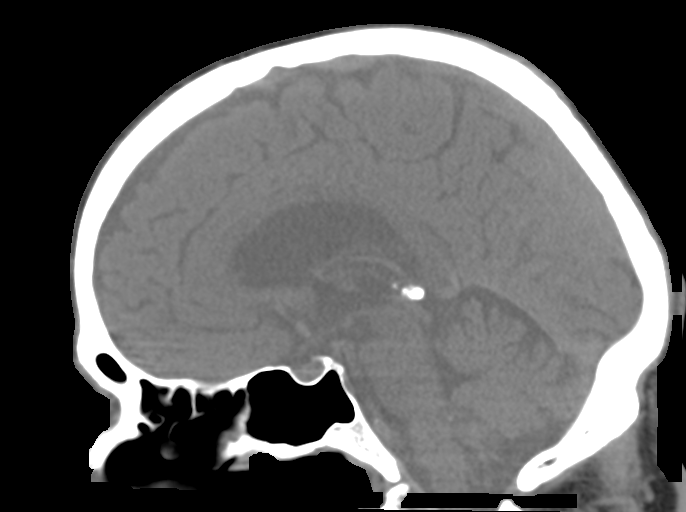
[im 31/62  bone]
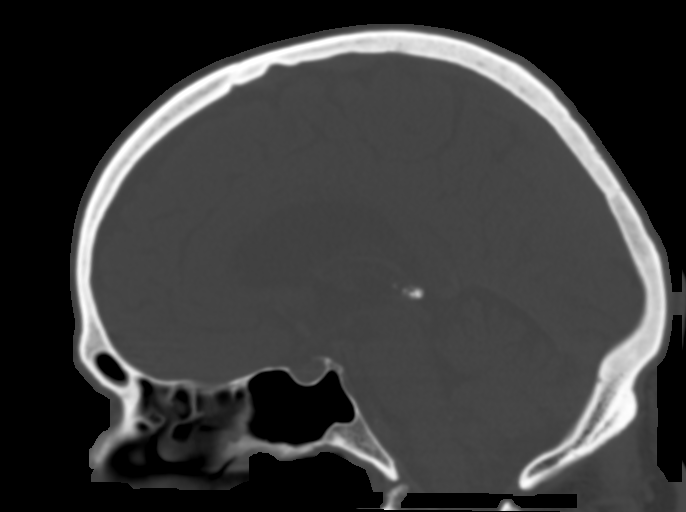
[im 36/62  bone]
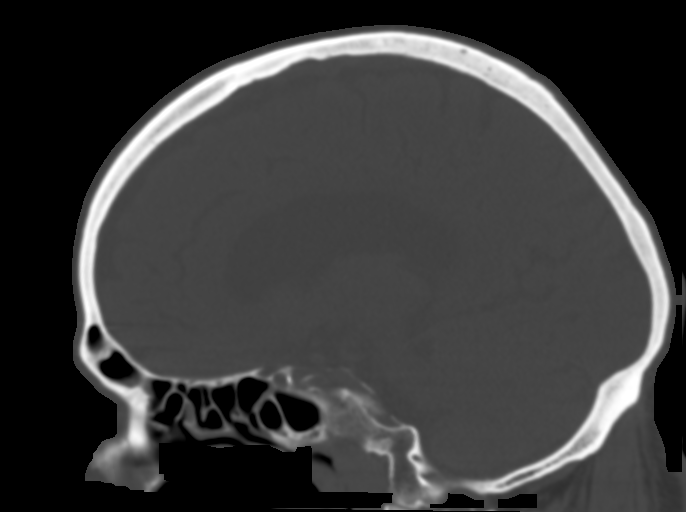
[im 41/62  bone]
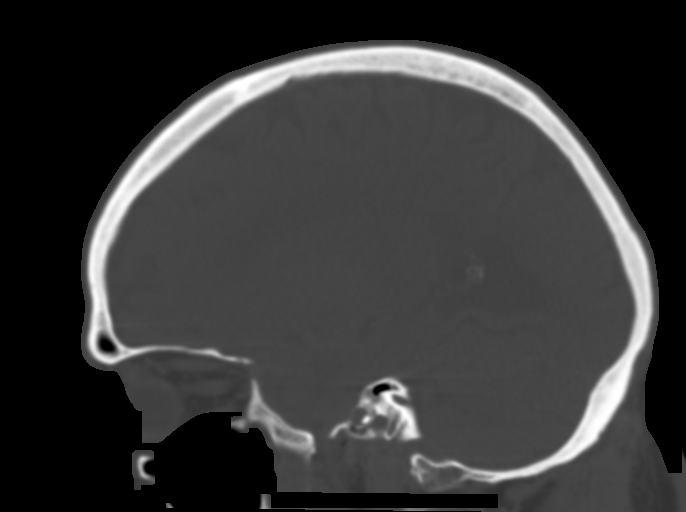

[13 of 33 positions shown; findings below may reference images not displayed]

FINDINGS: CT HEAD FINDINGS

Brain: No acute stroke, hemorrhage, extra-axial collection,
hydrocephalus, or herniation lobulated meningioma along the medial
aspect of the left middle cranial fossa as well as hypoattenuation
throughout the left anterior temporal lobe likely representing
associated edema is stable in comparison with the prior MRI of the
brain given differences in technique. Nonspecific white matter
hypodensities are compatible with chronic microvascular ischemic
changes and there is volume loss of the brain which is also stable.

Vascular: Calcific atherosclerosis of carotid siphons and vertebral
arteries.

Skull: Normal. Negative for fracture or focal lesion.

Sinuses/Orbits: Mild paranasal sinus mucosal thickening. Normal
aeration of mastoid air cells. Bilateral intra-ocular lens
replacement.

Other: None.

CT CERVICAL SPINE FINDINGS

Alignment: Mild reversal of cervical curvature with apex at C5. No
listhesis.

Skull base and vertebrae: No acute fracture. No primary bone lesion
or focal pathologic process.

Soft tissues and spinal canal: No prevertebral fluid or swelling. No
visible canal hematoma.

Disc levels: Loss of intervertebral disc space height greatest at
the C3-4 and C6-7 levels. Right-sided C3-C5 as well as left-sided
C4-C6 facet fusion. Uncovertebral and facet hypertrophy result in
bony neural foraminal encroachment at the right C3-4 and bilateral
C6-7 levels. Mild spinal canal stenosis at C6-7.

Upper chest: Negative.

Other: 21 mm nodule in the left lobe of the thyroid gland.
IMPRESSION: CT head:

1. No acute intracranial abnormality identified.
2. Stable left middle cranial fossa meningioma and left anterior
temporal lobe edema given differences in technique.
3. Stable chronic microvascular ischemic changes and volume loss of
the brain.

CT CERVICAL SPINE:.

1. No acute fracture or dislocation identified.
2. Moderate cervical spondylosis greatest at C3-4 and C6-7 levels.
3. 21 mm nodule in left lobe of thyroid gland. Further evaluation
with thyroid ultrasound is recommended on a nonemergent basis.

## 2020-02-06 ENCOUNTER — Other Ambulatory Visit: Payer: Self-pay

## 2020-02-06 ENCOUNTER — Encounter: Payer: Self-pay | Admitting: Cardiovascular Disease

## 2020-02-06 ENCOUNTER — Ambulatory Visit (INDEPENDENT_AMBULATORY_CARE_PROVIDER_SITE_OTHER): Payer: Medicare HMO | Admitting: Cardiovascular Disease

## 2020-02-06 VITALS — BP 134/58 | HR 64 | Ht 67.0 in | Wt 171.0 lb

## 2020-02-06 DIAGNOSIS — I5032 Chronic diastolic (congestive) heart failure: Secondary | ICD-10-CM | POA: Diagnosis not present

## 2020-02-06 DIAGNOSIS — I2583 Coronary atherosclerosis due to lipid rich plaque: Secondary | ICD-10-CM

## 2020-02-06 DIAGNOSIS — I2510 Atherosclerotic heart disease of native coronary artery without angina pectoris: Secondary | ICD-10-CM | POA: Diagnosis not present

## 2020-02-06 NOTE — Patient Instructions (Signed)
Medication Instructions:  1) DISCONTINUE Furosemide  *If you need a refill on your cardiac medications before your next appointment, please call your pharmacy*   Lab Work: None If you have labs (blood work) drawn today and your tests are completely normal, you will receive your results only by: Marland Kitchen MyChart Message (if you have MyChart) OR . A paper copy in the mail If you have any lab test that is abnormal or we need to change your treatment, we will call you to review the results.   Testing/Procedures: None   Follow-Up: At [facility], you and your health needs are our priority.  As part of our continuing mission to provide you with exceptional heart care, we have created designated Provider Care Teams.  These Care Teams include your primary Cardiologist (physician) and Advanced Practice Providers (APPs -  Physician Assistants and Nurse Practitioners) who all work together to provide you with the care you need, when you need it.  We recommend signing up for the patient portal called "MyChart".  Sign up information is provided on this After Visit Summary.  MyChart is used to connect with patients for Virtual Visits (Telemedicine).  Patients are able to view lab/test results, encounter notes, upcoming appointments, etc.  Non-urgent messages can be sent to your provider as well.   To learn more about what you can do with MyChart, go to [email or web address].    Your next appointment:   As needed  The format for your next appointment:   In Person  Provider:   You may see Dr. Mertie Moores or one of the following Advanced Practice Providers on your designated Care Team:    Richardson Dopp, PA-C  Robbie Lis, [place]    Other Instructions

## 2020-02-06 NOTE — Progress Notes (Signed)
Cardiology Office Note   Date:  02/06/2020   ID:  [name] [name], DOB 1925/06/09, MRN [id number]  PCP:  [name], MD  Cardiologist:   Mertie Moores, MD   Chief Complaint  Patient presents with  . Coronary Artery Disease  . Hypertension   1. Moderate coronary artery disease by heart catheterization 20 years ago 2. Hyperlipidemia 3. Hypertension 4. Mild AI 5, Mild TR   Notes prior to 2014   [name] [name] is a 86 y.o. gentleman with the above noted hx. He still has some ankle edema that he is concerned about. He has not had any chest pain. He is still very active and healthy.  Dec. 23, 2014:  [name] [name] has done well. No CP. No dyspnea or syncope. Still moderately active. He walks some. He does some stretching. He wants to start bowling again. He does have some symptoms when he first lies down or sits up. ? Vertigo vs. orthostasis hyotension.   03/28/2014:  [name] [name] is doing ok His wife died last year.  He stopped his amlodipine earlier this year - insurance would not pay for it so he stopped it. BP has been OK since that time Tries to watch his salt. Still eats canned veggies and other salty food.    Oct 20, 2014:  [name] [name] is a 86 y.o. male who presents for  Follow up for his CHf  May 24 ,2017:  [name] [name] is seen for his HTN and chronic diastolic CHF No CP or dyspnea.  Had some mid sternal CP several weeks will- has resolved now  Sept. 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   It is growing slowly  No CP or dyspnea.  Has some occasional leg pain .  Has some neuropathy also   He has stopped his Atorvastatin - his recent lipid levels were slightly elevated.  He was concerned about possible side effect.   March 22, 2019:  [name] [name] seen for follow-up visit.  He was found to have a benign brain tumor last year.   He was examined  in the wheelchair.  Use a wheelchair mostly .  For  Short distances he will use his walker.   Naproxen cholesterol levels from his primary medical doctor.  His total cholesterol is 134.  The HDL is 57.  The triglyceride level is 59.  The LDL is 63.  Very hard of hearing   Aug. 23, 2021:  Feels well .  No CP .   Has some right foot pain from getting his foot caught in an elevator .  Living in assisted living - [facility]  Did some lifeline screening - chol levels look good      Past Medical History:  Diagnosis Date  . Allergic rhinitis   . Amnesia   . Atherosclerotic heart disease of native coronary artery without angina pectoris   . BPH (benign prostatic hyperplasia)   . Bradycardia   . Chronic diastolic (congestive) heart failure ([place])   . Colon polyps   . Coronary artery disease    MODERATE  . Dizziness   . Edema of lower extremity   . Erectile dysfunction   . H. pylori infection    Hx of   . Hard of hearing   . Headache(784.0)   . History of colon polyps 2009  Dr. Orr/Colonoscopy -small cecal adenoma  . History of falling   . Hx of colonoscopy 2009   Dr Lajoyce Corners, hemorrhoids & polyps  . Hyperlipidemia   . Hypertension   . Hypertensive heart disease with heart failure ([place])   . Hypokalemia   . Internal hemorrhoids 2009   Dr. Lajoyce Corners Colonoscopy  . Metabolic encephalopathy   . Mitral valve regurgitation   . Muscle weakness (generalized)   . Pneumonia   . Primary generalized (osteo)arthritis   . Reflux esophagitis   . Sepsis ([place]) 11/04/2018  . Unspecified hemorrhoids   . Unsteadiness on feet   . Vitamin D deficiency     Past Surgical History:  Procedure Laterality Date  . APPENDECTOMY    . CARDIAC CATHETERIZATION  1997   REVEALED MILD TO MODERATE IRREGULARITIES. HIS LEFT EVNTRICULAR SYSTOLIC FUNCTION REVEALED NORMAL EJECTION FRACTION WITH EF OF 70%  . CATARACT EXTRACTION, BILATERAL    . COLONOSCOPY  multiple  . fatty tumor removal      benign  . HEMORROIDECTOMY    . HIP ARTHROPLASTY Left 08/17/2013   Procedure: ARTHROPLASTY BIPOLAR HIP;  Surgeon: Gearlean Alf, MD;  Location: [facility];  Service: Orthopedics;  Laterality: Left;  . INGUINAL HERNIA REPAIR Bilateral 2006  . lower back surgery  2006  . NASAL SEPTUM SURGERY    . TONSILLECTOMY    . UPPER GASTROINTESTINAL ENDOSCOPY       Current Outpatient Medications  Medication Sig Dispense Refill  . acetaminophen (TYLENOL) 500 MG tablet Take 500 mg by mouth every 8 (eight) hours as needed for mild pain or headache.     Marland Kitchen amLODipine (NORVASC) 5 MG tablet Take 5 mg by mouth daily.    Marland Kitchen aspirin EC 81 MG tablet Take 81 mg by mouth daily.    . Carboxymethylcellulose Sod PF 0.5 % SOLN Place 1 drop into both eyes daily as needed (dry eyes).     . cephALEXin (KEFLEX) 500 MG capsule 2 caps po bid x 7 days 28 capsule 0  . gabapentin (NEURONTIN) 100 MG capsule Take 200 mg by mouth at bedtime.    . hydrocortisone (ANUSOL-HC) 2.5 % rectal cream Place 1 application rectally daily as needed for hemorrhoids or anal itching (USE PERINEAL APPLICATOR). With perineal applicator     . levETIRAcetam (KEPPRA) 500 MG tablet Take 1 tablet (500 mg total) by mouth 2 (two) times daily. 180 tablet 4  . lisinopril (ZESTRIL) 40 MG tablet Take 40 mg by mouth daily.    . methocarbamol (ROBAXIN) 500 MG tablet Take 250 mg by mouth See admin instructions. Take 250 mg by mouth in the morning and an additional 250 mg once a day as needed for muscle spasms    . Multiple Vitamins-Minerals (THERA-M) TABS Take 1 tablet by mouth daily.    Marland Kitchen omeprazole (PRILOSEC) 20 MG capsule Take 20 mg by mouth daily.    . rosuvastatin (CRESTOR) 5 MG tablet Take 5 mg by mouth daily.     . sodium chloride (DEEP SEA NASAL SPRAY) 0.65 % nasal spray Place 1 spray into the nose daily as needed for congestion (BOTH NOSTRILS).     Marland Kitchen traZODone (DESYREL) 50 MG tablet Take 0.5 tablets (25 mg total) by mouth at bedtime. 30 tablet 0  . vitamin  B-12 (CYANOCOBALAMIN) 1000 MCG tablet Take 1,000 mcg by mouth daily.     No current facility-administered medications for this visit.    Allergies:   Patient has no known allergies.  Social History:  The patient  reports that he quit smoking about 44 years ago. He has never used smokeless tobacco. He reports current alcohol use. He reports that he does not use drugs.   Family History:  The patient's family history includes Breast cancer in his sister; Colon cancer (age of onset: 67) in his mother.    ROS:  Noted in current hx    Physical Exam: Blood pressure (!) 134/58, pulse 64, height 5\' 7"  (1.702 m), weight 171 lb (77.6 kg), SpO2 96 %.  GEN:  Elderly man.  Very hard of hearing  HEENT: Normal NECK: No JVD; No carotid bruits LYMPHATICS: No lymphadenopathy CARDIAC: RRR , no murmurs, rubs, gallops RESPIRATORY:  Clear to auscultation without rales, wheezing or rhonchi  ABDOMEN: Soft, non-tender, non-distended MUSCULOSKELETAL:  No edema; No deformity  SKIN: Warm and dry NEUROLOGIC:  Alert and oriented x 3   EKG:    Recent Labs: 09/27/2019: ALT 23 01/12/2020: BUN 13; Creatinine, Ser 0.90; Hemoglobin 13.9; Platelets 174; Potassium 3.7; Sodium 134    Lipid Panel    Component Value Date/Time   CHOL 121 06/28/2019 0000   CHOL 194 02/17/2017 0820   TRIG 62 06/28/2019 0000   HDL 46 06/28/2019 0000   HDL 72 02/17/2017 0820   CHOLHDL 2.7 02/17/2017 0820   CHOLHDL 2.2 10/30/2015 0737   VLDL 11 10/30/2015 0737   LDLCALC 61 06/28/2019 0000   LDLCALC 105 (H) 02/17/2017 0820      Wt Readings from Last 3 Encounters:  02/06/20 171 lb (77.6 kg)  01/12/20 151 lb (68.5 kg)  12/27/19 164 lb (74.4 kg)      Other studies Reviewed: Additional studies/ records that were reviewed today include: . Review of the above records demonstrates:    ASSESSMENT AND PLAN:  1. Moderate coronary artery disease by heart catheterization 20 years ago,  No angina .      2. Hyperlipidemia-    Cont meds.   Will have his primary MD continue with the management of his hyperlipidemia.   3. Hypertension -    BP is well controlled.   4. Balance issues:        5.  Brain meningioma Further plans per primary MD   5. Mild AI 6, Mild TR 7. Chronic diastolic congestive heart failure-   Overall he is fairly stable.  He would like to follow up with his primary MD. We will have him return PRN.   Current medicines are reviewed at length with the patient today.  The patient does not have concerns regarding medicines.  The following changes have been made:  no change  Labs/ tests ordered today include:   No orders of the defined types were placed in this encounter.   Continue same meds.  Disposition:   FU with [place] PRN .     Mertie Moores, MD  02/06/2020 5:51 PM    [facility] [place], [place], [place]  [address] Phone: [phone]; Fax: [phone]

## 2020-02-15 ENCOUNTER — Non-Acute Institutional Stay: Payer: Medicare HMO | Admitting: Nurse Practitioner

## 2020-02-15 ENCOUNTER — Encounter: Payer: Self-pay | Admitting: Nurse Practitioner

## 2020-02-15 DIAGNOSIS — M5441 Lumbago with sciatica, right side: Secondary | ICD-10-CM | POA: Diagnosis not present

## 2020-02-15 DIAGNOSIS — R269 Unspecified abnormalities of gait and mobility: Secondary | ICD-10-CM | POA: Diagnosis not present

## 2020-02-15 DIAGNOSIS — I2510 Atherosclerotic heart disease of native coronary artery without angina pectoris: Secondary | ICD-10-CM

## 2020-02-15 DIAGNOSIS — I5032 Chronic diastolic (congestive) heart failure: Secondary | ICD-10-CM

## 2020-02-15 DIAGNOSIS — W19XXXA Unspecified fall, initial encounter: Secondary | ICD-10-CM

## 2020-02-15 DIAGNOSIS — I2583 Coronary atherosclerosis due to lipid rich plaque: Secondary | ICD-10-CM

## 2020-02-15 DIAGNOSIS — I10 Essential (primary) hypertension: Secondary | ICD-10-CM | POA: Diagnosis not present

## 2020-02-15 DIAGNOSIS — M25551 Pain in right hip: Secondary | ICD-10-CM | POA: Diagnosis not present

## 2020-02-15 DIAGNOSIS — R569 Unspecified convulsions: Secondary | ICD-10-CM

## 2020-02-15 DIAGNOSIS — K219 Gastro-esophageal reflux disease without esophagitis: Secondary | ICD-10-CM

## 2020-02-15 DIAGNOSIS — I4891 Unspecified atrial fibrillation: Secondary | ICD-10-CM

## 2020-02-15 NOTE — Assessment & Plan Note (Signed)
Chronic lower back pain/right hip pain,  on Gabapentin 200mg qd 

## 2020-02-15 NOTE — Assessment & Plan Note (Addendum)
Afib, heart rate is conrolled, not a candidate for anticoagulation, 09/2019 during hospitalization for seizure, UTI, sepsis. CHA2DS2-VASc, around 5.Not a candidate for anticoagulation

## 2020-02-15 NOTE — Assessment & Plan Note (Signed)
HTN, blood pressure is controled on amlodipine 5mg qd, Lisinopril 40mg qd.  

## 2020-02-15 NOTE — Assessment & Plan Note (Signed)
W/c for mobility °

## 2020-02-15 NOTE — Assessment & Plan Note (Signed)
Improved, takes Omeprazole 20mg  qd.

## 2020-02-15 NOTE — Assessment & Plan Note (Signed)
OA, s/p left hip surgery, now chronic right hip pain, w/c helps, prn Tylenol, Methocarbamol 250mg  qam/daily prn

## 2020-02-15 NOTE — Assessment & Plan Note (Signed)
Hx of CAD, hyperlipidemia, on Crestor  

## 2020-02-15 NOTE — Assessment & Plan Note (Signed)
CHF, EF 45-60%, grade 1 diastolic dysfunction, on Furosemide 40mg  qd, saw Cardiology 02/06/20. Chronic edema BLE

## 2020-02-15 NOTE — Assessment & Plan Note (Signed)
fell on the floor next to his bed during self transfer because of his bed was not locked.   Encourage safety awareness, call for assistance if needed

## 2020-02-15 NOTE — Assessment & Plan Note (Signed)
Stable, continue Keppra 

## 2020-02-15 NOTE — Progress Notes (Signed)
Location:   [facility] Room Number: 907 Place of Service:  ALF (13) Provider: Lennie Odor Emmert Roethler NP  [name], MD  Patient Care Team: [name], MD as PCP - General (Internal Medicine) Gatha Mayer, MD as Consulting Physician (Gastroenterology) [name], [name], MD as Consulting Physician (Cardiology) [name], MD as Attending Physician (Internal Medicine) Lamel Mccarley X, NP as Nurse Practitioner (Internal Medicine)  Extended Emergency Contact Information Primary Emergency Contact: Karwowski,[name] Address: [address]          [place], [place] [address] [name] of [place] Phone: [phone] Mobile Phone: [phone] Relation: Daughter Secondary Emergency Contact: Schlotterbeck,[name] Address: [place] [address] [place] Mobile Phone: [phone] Relation: Daughter  Code Status: DNR Goals of care: Advanced Directive information Advanced Directives 02/15/2020  Does Patient Have a Medical Advance Directive? Yes  Type of Paramedic of [place];Living will  Does patient want to make changes to medical advance directive? No - Patient declined  Copy of [place] in Chart? Yes - validated most recent copy scanned in chart (See row information)  Pre-existing out of facility DNR order (yellow form or pink MOST form) -     Chief Complaint  Patient presents with   Acute Visit    Fall    HPI:  Pt is a 86 y.o. male seen today for an acute visit for fell on the floor next to his bed during self transfer because of his bed was not locked.    ED 01/12/20 fall, seizure, UTI, treated with 7 day course of Keflex.    Afib, heart rate is conrolled, not a candidate for anticoagulation,              CHF, EF 63-81%, grade 1 diastolic dysfunction, on Furosemide 52m qd, saw Cardiology 02/06/20             HTN, blood pressure is controled on amlodipine 519mqd, Lisinopril 4053md.                Hx of CAD, hyperlipidemia, on Crestor             GERD, takes Omeprazole 82m37m.              Insomnia, stable, on Trazodone 25mg56m            Chronic lower back pain/right hip pain,  on Gabapentin 200mg 13m           OA, s/p left hip surgery, chronic right hip pain, w/c helps, prn Tylenol, Methocarbamol 250mg q59maily prn             Seizure: takes Keppra 500mg bi59mPast Medical History:  Diagnosis Date   Allergic rhinitis    Amnesia    Atherosclerotic heart disease of native coronary artery without angina pectoris    BPH (benign prostatic hyperplasia)    Bradycardia    Chronic diastolic (congestive) heart failure (HCC)    Colon polyps    Coronary artery disease    MODERATE   Dizziness    Edema of lower extremity    Erectile dysfunction    H. pylori infection    Hx of    Hard of hearing    Headache(784.0)    History of colon polyps 2009   Dr. Orr/Colonoscopy -small cecal adenoma   History of falling  Hx of colonoscopy 2009   Dr Lajoyce Corners, hemorrhoids & polyps   Hyperlipidemia    Hypertension    Hypertensive heart disease with heart failure [facility])    Hypokalemia    Internal hemorrhoids 2009   Dr. Lajoyce Corners Colonoscopy   Metabolic encephalopathy    Mitral valve regurgitation    Muscle weakness (generalized)    Pneumonia    Primary generalized (osteo)arthritis    Reflux esophagitis    Sepsis ([place]) 11/04/2018   Unspecified hemorrhoids    Unsteadiness on feet    Vitamin D deficiency    Past Surgical History:  Procedure Laterality Date   APPENDECTOMY     CARDIAC CATHETERIZATION  1997   REVEALED MILD TO MODERATE IRREGULARITIES. HIS LEFT EVNTRICULAR SYSTOLIC FUNCTION REVEALED NORMAL EJECTION FRACTION WITH EF OF 70%   CATARACT EXTRACTION, BILATERAL     COLONOSCOPY  multiple   fatty tumor removal     benign   HEMORROIDECTOMY     HIP ARTHROPLASTY Left 08/17/2013   Procedure: ARTHROPLASTY BIPOLAR HIP;  Surgeon: Gearlean Alf, MD;  Location: [facility];  Service: Orthopedics;  Laterality: Left;   INGUINAL HERNIA REPAIR Bilateral 2006   lower back surgery  2006   NASAL SEPTUM SURGERY     TONSILLECTOMY     UPPER GASTROINTESTINAL ENDOSCOPY      No Known Allergies  Allergies as of 02/15/2020   No Known Allergies     Medication List       Accurate as of February 15, 2020 11:59 PM. If you have any questions, ask your nurse or doctor.        STOP taking these medications   cephALEXin 500 MG capsule Commonly known as: Keflex Stopped by: Mahlon Gabrielle X Roberta Kelly, NP   furosemide 40 MG tablet Commonly known as: LASIX Stopped by: Kirsten Spearing X Khristine Verno, NP     TAKE these medications   acetaminophen 500 MG tablet Commonly known as: TYLENOL Take 500 mg by mouth every 8 (eight) hours as needed for mild pain or headache.   amLODipine 5 MG tablet Commonly known as: NORVASC Take 5 mg by mouth daily.   Anusol-HC 2.5 % rectal cream Generic drug: hydrocortisone Place 1 application rectally daily as needed for hemorrhoids or anal itching (USE PERINEAL APPLICATOR). With perineal applicator   aspirin EC 81 MG tablet Take 81 mg by mouth daily.   Carboxymethylcellulose Sod PF 0.5 % Soln Place 1 drop into both eyes daily as needed (dry eyes).   [place] Nasal Spray 0.65 % nasal spray Generic drug: sodium chloride Place 1 spray into the nose daily as needed for congestion (BOTH NOSTRILS).   gabapentin 100 MG capsule Commonly known as: NEURONTIN Take 200 mg by mouth at bedtime.   levETIRAcetam 500 MG tablet Commonly known as: KEPPRA Take 1 tablet (500 mg total) by mouth 2 (two) times daily.   lisinopril 40 MG tablet Commonly known as: ZESTRIL Take 40 mg by mouth daily.   omeprazole 20 MG capsule Commonly known as: PRILOSEC Take 20 mg by mouth daily.   Robaxin 500 MG tablet Generic drug: methocarbamol Take 250 mg by mouth See admin instructions. Take 250 mg by mouth in the morning and an additional 250 mg once a  day as needed for muscle spasms   rosuvastatin 5 MG tablet Commonly known as: CRESTOR Take 5 mg by mouth daily.   Thera-M Tabs Take 1 tablet by mouth daily.   traZODone 50 MG tablet Commonly known as: DESYREL Take 0.5 tablets (  25 mg total) by mouth at bedtime.   vitamin B-12 1000 MCG tablet Commonly known as: CYANOCOBALAMIN Take 1,000 mcg by mouth daily.       Review of Systems  Constitutional: Positive for unexpected weight change. Negative for fatigue and fever.       #4Ibs weight gained in the past month  HENT: Positive for hearing loss. Negative for congestion and voice change.   Eyes: Negative for visual disturbance.  Respiratory: Negative for cough and shortness of breath.   Cardiovascular: Positive for leg swelling.  Gastrointestinal: Negative for abdominal pain, constipation and vomiting.  Genitourinary: Negative for difficulty urinating, dysuria and urgency.  Musculoskeletal: Positive for arthralgias, back pain and gait problem.       Lower back, right hip/thigh pain   Skin: Negative for color change.  Neurological: Negative for dizziness, seizures, speech difficulty and headaches.       Memory lapses. Tingling in toes.   Psychiatric/Behavioral: Negative for behavioral problems and sleep disturbance. The patient is not nervous/anxious.     Immunization History  Administered Date(s) Administered   Influenza, High Dose Seasonal PF 03/19/2019, 03/19/2019   Influenza-Unspecified 04/08/2016   Moderna SARS-COVID-2 Vaccination 06/18/2019, 07/16/2019   Pneumococcal Conjugate-13 06/02/2013   Pneumococcal Polysaccharide-23 04/07/2001   Pertinent  Health Maintenance Due  Topic Date Due   INFLUENZA VACCINE  01/15/2020   PNA vac Low Risk Adult  Completed   Fall Risk  11/03/2017 07/11/2016  Falls in the past year? Yes Yes  Number falls in past yr: 2 or more 2 or more  Injury with Fall? Yes Yes  Comment - broke toe on right foot  Risk Factor Category  High Fall  Risk -  Follow up Education provided;Falls prevention discussed -   Functional Status Survey:    Vitals:   02/15/20 1456  BP: 140/60  Pulse: 60  Resp: 20  Temp: (!) 97.3 F (36.3 C)  SpO2: 99%  Weight: 175 lb (79.4 kg)  Height: 5' 7"  (1.702 m)   Body mass index is 27.41 kg/m. Physical Exam Vitals and nursing note reviewed.  Constitutional:      Appearance: Normal appearance.  HENT:     Head: Normocephalic and atraumatic.     Mouth/Throat:     Mouth: Mucous membranes are moist.  Eyes:     Extraocular Movements: Extraocular movements intact.     Conjunctiva/sclera: Conjunctivae normal.     Pupils: Pupils are equal, round, and reactive to light.  Cardiovascular:     Rate and Rhythm: Normal rate and regular rhythm.     Heart sounds: Murmur heard.   Pulmonary:     Breath sounds: No wheezing or rales.  Abdominal:     General: Bowel sounds are normal.     Palpations: Abdomen is soft.     Tenderness: There is no abdominal tenderness.  Musculoskeletal:     Cervical back: Normal range of motion and neck supple. No muscular tenderness.     Right lower leg: Edema present.     Left lower leg: Edema present.     Comments: 1+ edema BLE  Skin:    General: Skin is warm and dry.     Comments: Chronic venous insufficiency skin changes BLE.   Neurological:     General: No focal deficit present.     Mental Status: He is alert. Mental status is at baseline.     Gait: Gait abnormal.     Comments: Oriented to person, place.   Psychiatric:  Mood and Affect: Mood normal.        Behavior: Behavior normal.        Thought Content: Thought content normal.        Judgment: Judgment normal.     Labs reviewed: Recent Labs    09/22/19 0333 09/22/19 0333 09/23/19 0619 09/27/19 0000 01/12/20 0257  NA 135   < > 137 136* 134*  K 3.9   < > 4.3 4.1 3.7  CL 101   < > 101 101 95*  CO2 23  --  25 29*  --   GLUCOSE 97  --  107*  --  131*  BUN 11   < > 18 15 13   CREATININE 0.92    < > 1.03 0.9 0.90  CALCIUM 8.8*  --  9.0 8.9  --    < > = values in this interval not displayed.   Recent Labs    03/01/19 0000 09/18/19 2256 09/27/19 0000  AST 17 39 20  ALT 16 27 23   ALKPHOS 57 71 60  BILITOT  --  0.8  --   PROT 6.2 6.3*  --   ALBUMIN 3.9 3.8 3.4*   Recent Labs    09/22/19 0333 09/22/19 0333 09/23/19 0857 09/23/19 0857 09/27/19 0000 01/12/20 0250 01/12/20 0257  WBC 9.5   < > 8.8  --  10.8 11.6*  --   NEUTROABS 5.9   < > 5.8  --  7,096 10.0*  --   HGB 14.1   < > 15.5   < > 13.4* 14.1 13.9  HCT 41.8   < > 46.2   < > 39* 40.7 41.0  MCV 95.9  --  96.7  --   --  93.3  --   PLT 208   < > 260  --  230 174  --    < > = values in this interval not displayed.   Lab Results  Component Value Date   TSH 1.504 11/01/2018   No results found for: HGBA1C Lab Results  Component Value Date   CHOL 121 06/28/2019   HDL 46 06/28/2019   LDLCALC 61 06/28/2019   TRIG 62 06/28/2019   CHOLHDL 2.7 02/17/2017    Significant Diagnostic Results in last 30 days:  No results found.  Assessment/Plan: Fall fell on the floor next to his bed during self transfer because of his bed was not locked.   Encourage safety awareness, call for assistance if needed   New onset atrial fibrillation ([place]) Afib, heart rate is conrolled, not a candidate for anticoagulation, 09/2019 during hospitalization for seizure, UTI, sepsis. CHA2DS2-VASc, around 5.Not a candidate for anticoagulation    Chronic diastolic heart failure (HCC) CHF, EF 62-94%, grade 1 diastolic dysfunction, on Furosemide 69m qd, saw Cardiology 02/06/20. Chronic edema BLE   Hypertension HTN, blood pressure is controled on amlodipine 586mqd, Lisinopril 4040md.   CAD (coronary artery disease)   Hx of CAD, hyperlipidemia, on Crestor  GERD (gastroesophageal reflux disease) Improved, takes Omeprazole 84m49m.    Lower back pain Chronic lower back pain/right hip pain,  on Gabapentin 200mg93m  Seizure  (HCC) Stable, continue Keppra  Gait abnormality W/c for mobility.   Right hip pain  OA, s/p left hip surgery, now chronic right hip pain, w/c helps, prn Tylenol, Methocarbamol 250mg 31mdaily prn     Family/ staff Communication: plan of care reviewed with the patient and charge nurse.   Labs/tests ordered:  CBC/diff, CMP/eGFR  Time spend 40 minutes.

## 2020-02-16 ENCOUNTER — Encounter: Payer: Self-pay | Admitting: Nurse Practitioner

## 2020-02-16 DIAGNOSIS — I10 Essential (primary) hypertension: Secondary | ICD-10-CM | POA: Diagnosis not present

## 2020-02-16 DIAGNOSIS — E876 Hypokalemia: Secondary | ICD-10-CM | POA: Diagnosis not present

## 2020-02-16 LAB — COMPREHENSIVE METABOLIC PANEL
Albumin: 3.9 (ref 3.5–5.0)
Calcium: 9.1 (ref 8.7–10.7)
GFR calc Af Amer: 90
GFR calc non Af Amer: 78
Globulin: 2.2

## 2020-02-16 LAB — BASIC METABOLIC PANEL
BUN: 10 (ref 4–21)
CO2: 24 — AB (ref 13–22)
Chloride: 100 (ref 99–108)
Creatinine: 0.8 (ref 0.6–1.3)
Glucose: 11
Potassium: 4.1 (ref 3.4–5.3)
Sodium: 134 — AB (ref 137–147)

## 2020-02-16 LAB — CBC: RBC: 4.58 (ref 3.87–5.11)

## 2020-02-16 LAB — HEPATIC FUNCTION PANEL
ALT: 15 (ref 10–40)
AST: 15 (ref 14–40)
Alkaline Phosphatase: 60 (ref 25–125)
Bilirubin, Total: 0.8

## 2020-02-16 LAB — CBC AND DIFFERENTIAL
HCT: 43 (ref 41–53)
Hemoglobin: 14.7 (ref 13.5–17.5)
Neutrophils Absolute: 4455
Platelets: 192 (ref 150–399)
WBC: 7.5

## 2020-04-03 ENCOUNTER — Encounter: Payer: Self-pay | Admitting: Internal Medicine

## 2020-04-03 ENCOUNTER — Non-Acute Institutional Stay: Payer: Medicare HMO | Admitting: Internal Medicine

## 2020-04-03 DIAGNOSIS — I5032 Chronic diastolic (congestive) heart failure: Secondary | ICD-10-CM | POA: Diagnosis not present

## 2020-04-03 DIAGNOSIS — I2510 Atherosclerotic heart disease of native coronary artery without angina pectoris: Secondary | ICD-10-CM | POA: Diagnosis not present

## 2020-04-03 DIAGNOSIS — R569 Unspecified convulsions: Secondary | ICD-10-CM

## 2020-04-03 DIAGNOSIS — R269 Unspecified abnormalities of gait and mobility: Secondary | ICD-10-CM

## 2020-04-03 DIAGNOSIS — N401 Enlarged prostate with lower urinary tract symptoms: Secondary | ICD-10-CM | POA: Diagnosis not present

## 2020-04-03 DIAGNOSIS — I2583 Coronary atherosclerosis due to lipid rich plaque: Secondary | ICD-10-CM

## 2020-04-03 DIAGNOSIS — K219 Gastro-esophageal reflux disease without esophagitis: Secondary | ICD-10-CM

## 2020-04-03 DIAGNOSIS — I10 Essential (primary) hypertension: Secondary | ICD-10-CM | POA: Diagnosis not present

## 2020-04-03 DIAGNOSIS — I4891 Unspecified atrial fibrillation: Secondary | ICD-10-CM | POA: Diagnosis not present

## 2020-04-03 DIAGNOSIS — R3914 Feeling of incomplete bladder emptying: Secondary | ICD-10-CM

## 2020-04-03 NOTE — Progress Notes (Signed)
Location:  [facility] Room Number: 907 Place of Service:  ALF [phone])  Provider:   Code Status:  Goals of Care:  Advanced Directives 02/15/2020  Does Patient Have a Medical Advance Directive? Yes  Type of Paramedic of [place];Living will  Does patient want to make changes to medical advance directive? No - Patient declined  Copy of [place] in Chart? Yes - validated most recent copy scanned in chart (See row information)  Pre-existing out of facility DNR order (yellow form or pink MOST form) -     Chief Complaint  Patient presents with  . Medical Management of Chronic Issues    HPI: Patient is a 86 y.o. male seen today for medical management of chronic diseases.    Patient has a history of BPH, bradycardia, CAD, hypertension, diastolic CHF, GERD,H/o Left Sphenoid MeningiomaAnd Chronic Right Hip Pain And recent diagnosis of Seizures  Doing well. No New Complains. Except he does not want to take Aspirin anymore  He said he read article and there is no reason for him to take it Not on Lasix anymore No other issues. Weight is stable. Has gained some since Lasix stopped His appetite is good Lives in [place]  Uses his wheelchair mostly      Past Medical History:  Diagnosis Date  . Allergic rhinitis   . Amnesia   . Atherosclerotic heart disease of native coronary artery without angina pectoris   . BPH (benign prostatic hyperplasia)   . Bradycardia   . Chronic diastolic (congestive) heart failure ([place])   . Colon polyps   . Coronary artery disease    MODERATE  . Dizziness   . Edema of lower extremity   . Erectile dysfunction   . H. pylori infection    Hx of   . Hard of hearing   . Headache(784.0)   . History of colon polyps 2009   Dr. Orr/Colonoscopy -small cecal adenoma  . History of falling   . Hx of colonoscopy 2009   Dr Lajoyce Corners, hemorrhoids & polyps  . Hyperlipidemia   . Hypertension   .  Hypertensive heart disease with heart failure ([place])   . Hypokalemia   . Internal hemorrhoids 2009   Dr. Lajoyce Corners Colonoscopy  . Metabolic encephalopathy   . Mitral valve regurgitation   . Muscle weakness (generalized)   . Pneumonia   . Primary generalized (osteo)arthritis   . Reflux esophagitis   . Sepsis ([place]) 11/04/2018  . Unspecified hemorrhoids   . Unsteadiness on feet   . Vitamin D deficiency     Past Surgical History:  Procedure Laterality Date  . APPENDECTOMY    . CARDIAC CATHETERIZATION  1997   REVEALED MILD TO MODERATE IRREGULARITIES. HIS LEFT EVNTRICULAR SYSTOLIC FUNCTION REVEALED NORMAL EJECTION FRACTION WITH EF OF 70%  . CATARACT EXTRACTION, BILATERAL    . COLONOSCOPY  multiple  . fatty tumor removal     benign  . HEMORROIDECTOMY    . HIP ARTHROPLASTY Left 08/17/2013   Procedure: ARTHROPLASTY BIPOLAR HIP;  Surgeon: Gearlean Alf, MD;  Location: [facility];  Service: Orthopedics;  Laterality: Left;  . INGUINAL HERNIA REPAIR Bilateral 2006  . lower back surgery  2006  . NASAL SEPTUM SURGERY    . TONSILLECTOMY    . UPPER GASTROINTESTINAL ENDOSCOPY      No Known Allergies  Outpatient Encounter Medications as of 04/03/2020  Medication Sig  . acetaminophen (TYLENOL) 500 MG tablet Take 500 mg  by mouth every 8 (eight) hours as needed for mild pain or headache.   Marland Kitchen amLODipine (NORVASC) 5 MG tablet Take 5 mg by mouth daily.  Marland Kitchen aspirin EC 81 MG tablet Take 81 mg by mouth daily.  . Carboxymethylcellulose Sod PF 0.5 % SOLN Place 1 drop into both eyes daily as needed (dry eyes).   . gabapentin (NEURONTIN) 100 MG capsule Take 200 mg by mouth at bedtime.  . hydrocortisone (ANUSOL-HC) 2.5 % rectal cream Place 1 application rectally daily as needed for hemorrhoids or anal itching (USE PERINEAL APPLICATOR). With perineal applicator   . levETIRAcetam (KEPPRA) 500 MG tablet Take 1 tablet (500 mg total) by mouth 2 (two) times daily.  Marland Kitchen lisinopril (ZESTRIL) 40 MG tablet Take 40 mg by mouth  daily.  . methocarbamol (ROBAXIN) 500 MG tablet Take 250 mg by mouth See admin instructions. Take 250 mg by mouth in the morning and an additional 250 mg once a day as needed for muscle spasms  . Multiple Vitamins-Minerals (THERA-M) TABS Take 1 tablet by mouth daily.  Marland Kitchen omeprazole (PRILOSEC) 20 MG capsule Take 20 mg by mouth daily.  . rosuvastatin (CRESTOR) 5 MG tablet Take 5 mg by mouth daily.   . sodium chloride (DEEP SEA NASAL SPRAY) 0.65 % nasal spray Place 1 spray into the nose daily as needed for congestion (BOTH NOSTRILS).   Marland Kitchen traZODone (DESYREL) 50 MG tablet Take 0.5 tablets (25 mg total) by mouth at bedtime.  . vitamin B-12 (CYANOCOBALAMIN) 1000 MCG tablet Take 1,000 mcg by mouth daily.   No facility-administered encounter medications on file as of 04/03/2020.    Review of Systems:  Review of Systems  Constitutional: Negative.   HENT: Negative.   Respiratory: Negative.   Cardiovascular: Positive for leg swelling.  Gastrointestinal: Negative.   Genitourinary: Positive for frequency.  Musculoskeletal: Positive for gait problem.  Skin: Negative.   Neurological: Positive for numbness.  Psychiatric/Behavioral: Negative.     Health Maintenance  Topic Date Due  . TETANUS/TDAP  08/15/2023  . INFLUENZA VACCINE  Completed  . COVID-19 Vaccine  Completed  . PNA vac Low Risk Adult  Completed    Physical Exam: Vitals:   04/03/20 1347  BP: 126/64  Pulse: 63  Resp: 20  Temp: 99.3 F (37.4 C)  SpO2: 97%  Weight: 170 lb 6.4 oz (77.3 kg)  Height: 5\' 7"  (1.702 m)   Body mass index is 26.69 kg/m. Physical Exam Vitals reviewed.  Constitutional:      Appearance: Normal appearance.  HENT:     Head: Normocephalic.     Nose: Nose normal.     Mouth/Throat:     Mouth: Mucous membranes are moist.     Pharynx: Oropharynx is clear.  Eyes:     Pupils: Pupils are equal, round, and reactive to light.  Cardiovascular:     Rate and Rhythm: Normal rate and regular rhythm.      Pulses: Normal pulses.  Pulmonary:     Effort: Pulmonary effort is normal. No respiratory distress.     Breath sounds: Normal breath sounds. No wheezing.  Abdominal:     General: Abdomen is flat. Bowel sounds are normal.     Palpations: Abdomen is soft.  Musculoskeletal:        General: Swelling present.     Cervical back: Neck supple.  Skin:    General: Skin is warm.  Neurological:     General: No focal deficit present.     Mental Status: He  is alert and oriented to person, place, and time.  Psychiatric:        Mood and Affect: Mood normal.        Thought Content: Thought content normal.     Labs reviewed: Basic Metabolic Panel: Recent Labs    09/22/19 0333 09/22/19 0333 09/23/19 0619 09/27/19 0000 01/12/20 0257  NA 135   < > 137 136* 134*  K 3.9   < > 4.3 4.1 3.7  CL 101   < > 101 101 95*  CO2 23  --  25 29*  --   GLUCOSE 97  --  107*  --  131*  BUN 11   < > 18 15 13   CREATININE 0.92   < > 1.03 0.9 0.90  CALCIUM 8.8*  --  9.0 8.9  --    < > = values in this interval not displayed.   Liver Function Tests: Recent Labs    09/18/19 2256 09/27/19 0000  AST 39 20  ALT 27 23  ALKPHOS 71 60  BILITOT 0.8  --   PROT 6.3*  --   ALBUMIN 3.8 3.4*   No results for input(s): LIPASE, AMYLASE in the last 8760 hours. No results for input(s): AMMONIA in the last 8760 hours. CBC: Recent Labs    09/22/19 0333 09/22/19 0333 09/23/19 0857 09/23/19 0857 09/27/19 0000 01/12/20 0250 01/12/20 0257  WBC 9.5   < > 8.8  --  10.8 11.6*  --   NEUTROABS 5.9   < > 5.8  --  7,096 10.0*  --   HGB 14.1   < > 15.5   < > 13.4* 14.1 13.9  HCT 41.8   < > 46.2   < > 39* 40.7 41.0  MCV 95.9  --  96.7  --   --  93.3  --   PLT 208   < > 260  --  230 174  --    < > = values in this interval not displayed.   Lipid Panel: Recent Labs    06/28/19 0000  CHOL 121  HDL 46  LDLCALC 61  TRIG 62   No results found for: HGBA1C  Procedures since last visit: No results  found.  Assessment/Plan Chronic diastolic heart failure (HCC) Off lasix as per his request Does have LE edema but no SOB New onset atrial fibrillation (HCC) Not started on Anticoagulation due to his age and fraility  Essential hypertension On Lisinopril and Norvasc  Coronary artery disease  Aspirin Discontinued as he is refusing to take it On statin LDL 55 Gastroesophageal reflux disease, unspecified whether esophagitis present On Prilosec  Seizure ([place]) Stable on Keppra Gait abnormality Wheelchair dependent But independent in his transfers Low back Pain On Robaxin and Neurontin   Labs/tests ordered:  * No order type specified * Next appt:  Visit date not found

## 2020-04-05 DIAGNOSIS — I10 Essential (primary) hypertension: Secondary | ICD-10-CM | POA: Diagnosis not present

## 2020-04-06 ENCOUNTER — Non-Acute Institutional Stay (SKILLED_NURSING_FACILITY): Payer: Medicare HMO | Admitting: Nurse Practitioner

## 2020-04-06 ENCOUNTER — Encounter: Payer: Self-pay | Admitting: Nurse Practitioner

## 2020-04-06 DIAGNOSIS — R5381 Other malaise: Secondary | ICD-10-CM

## 2020-04-06 DIAGNOSIS — F5101 Primary insomnia: Secondary | ICD-10-CM

## 2020-04-06 DIAGNOSIS — I2583 Coronary atherosclerosis due to lipid rich plaque: Secondary | ICD-10-CM

## 2020-04-06 DIAGNOSIS — I872 Venous insufficiency (chronic) (peripheral): Secondary | ICD-10-CM

## 2020-04-06 DIAGNOSIS — I2510 Atherosclerotic heart disease of native coronary artery without angina pectoris: Secondary | ICD-10-CM

## 2020-04-06 DIAGNOSIS — R569 Unspecified convulsions: Secondary | ICD-10-CM

## 2020-04-06 DIAGNOSIS — R69 Illness, unspecified: Secondary | ICD-10-CM | POA: Diagnosis not present

## 2020-04-06 DIAGNOSIS — W19XXXA Unspecified fall, initial encounter: Secondary | ICD-10-CM | POA: Diagnosis not present

## 2020-04-06 DIAGNOSIS — I5032 Chronic diastolic (congestive) heart failure: Secondary | ICD-10-CM

## 2020-04-06 DIAGNOSIS — I10 Essential (primary) hypertension: Secondary | ICD-10-CM | POA: Diagnosis not present

## 2020-04-06 DIAGNOSIS — M5441 Lumbago with sciatica, right side: Secondary | ICD-10-CM

## 2020-04-06 DIAGNOSIS — B379 Candidiasis, unspecified: Secondary | ICD-10-CM

## 2020-04-06 DIAGNOSIS — M25551 Pain in right hip: Secondary | ICD-10-CM

## 2020-04-06 DIAGNOSIS — I4891 Unspecified atrial fibrillation: Secondary | ICD-10-CM

## 2020-04-06 DIAGNOSIS — K219 Gastro-esophageal reflux disease without esophagitis: Secondary | ICD-10-CM | POA: Diagnosis not present

## 2020-04-06 LAB — COMPREHENSIVE METABOLIC PANEL
Albumin: 3.4 — AB (ref 3.5–5.0)
Calcium: 8.6 — AB (ref 8.7–10.7)
Globulin: 1.8

## 2020-04-06 LAB — BASIC METABOLIC PANEL
BUN: 15 (ref 4–21)
CO2: 25 — AB (ref 13–22)
Chloride: 104 (ref 99–108)
Creatinine: 1 (ref 0.6–1.3)
Glucose: 120
Potassium: 4 (ref 3.4–5.3)
Sodium: 137 (ref 137–147)

## 2020-04-06 LAB — LIPID PANEL
Cholesterol: 94 (ref 0–200)
HDL: 37 (ref 35–70)
LDL Cholesterol: 41
LDl/HDL Ratio: 2.5
Triglycerides: 79 (ref 40–160)

## 2020-04-06 LAB — HEPATIC FUNCTION PANEL
ALT: 16 (ref 10–40)
AST: 16 (ref 14–40)
Alkaline Phosphatase: 66 (ref 25–125)
Bilirubin, Total: 0.7

## 2020-04-06 LAB — TSH: TSH: 1.77 (ref 0.41–5.90)

## 2020-04-06 NOTE — Assessment & Plan Note (Addendum)
04/05/20 fall when the patient was founding sitting in floor from attempting to transfer self from bed to w/c. Sit to stand life needed for transfer.

## 2020-04-06 NOTE — Progress Notes (Signed)
Location:   [facility] Room Number: 61 Place of Service:  SNF (31) Provider:  Kathaleen Dudziak, Lennie Odor NP  [name], MD  Patient Care Team: [name], MD as PCP - General (Internal Medicine) Gatha Mayer, MD as Consulting Physician (Gastroenterology) [name], [name], MD as Consulting Physician (Cardiology) [name], MD as Attending Physician (Internal Medicine) Amorah Sebring X, NP as Nurse Practitioner (Internal Medicine)  Extended Emergency Contact Information Primary Emergency Contact: Andres,[name] Address: [place], [place] [address] [name] of [place] Phone: [phone] Mobile Phone: [phone] Relation: Daughter Secondary Emergency Contact: Spike,[name] Address: [place] [address] [name] of [place] Phone: [phone] Mobile Phone: [phone] Relation: Son  Code Status:  DNR Goals of care: Advanced Directive information Advanced Directives 02/15/2020  Does Patient Have a Medical Advance Directive? Yes  Type of Paramedic of [place];Living will  Does patient want to make changes to medical advance directive? No - Patient declined  Copy of [place] in Chart? Yes - validated most recent copy scanned in chart (See row information)  Pre-existing out of facility DNR order (yellow form or pink MOST form) -     Chief Complaint  Patient presents with  . Acute Visit    ADL decline    HPI:  Pt is a 86 y.o. male seen today for an acute visit for needs assistance with ADLs. Fall 04/05/20 during transfer from bed to chair, no apparent injury, sit to stand lift needed, no new focal weakness noted. Also the patient was noted to rash in buttocks and scrotal ares due to moist, heat from using adult depends and not changed timely with no adequate personal hygiene provided by self.   Hx of PVD, BLE brownish  pigmentation, noted medial left lower leg redness, slightly warmth, and scaly area, no open areas   Afib, heart rate is conrolled, not a candidate for anticoagulation,  CHF, EF 79-15%, grade 1 diastolic dysfunction, off  Furosemide 16m qd, saw Cardiology 02/06/20 HTN, blood pressure is controled on amlodipine 59mqd, Lisinopril 4048md.  Hx of CAD, hyperlipidemia, on Crestor GERD, takes Omeprazole 42m30m.  Insomnia, stable, on Trazodone Chronic lower back pain/right hip pain, on Gabapentin 200mg64mOA, s/p left hip surgery, chronic right hip pain, w/c helps, prn Tylenol, Methocarbamol 250mg 56mdaily prn Seizure: takes Keppra 500mg b68m      Past Medical History:  Diagnosis Date  . Allergic rhinitis   . Amnesia   . Atherosclerotic heart disease of native coronary artery without angina pectoris   . BPH (benign prostatic hyperplasia)   . Bradycardia   . Chronic diastolic (congestive) heart failure (HCC)   [place] polyps   . Coronary artery disease    MODERATE  . Dizziness   . Edema of lower extremity   . Erectile dysfunction   . H. pylori infection    Hx of   . Hard of hearing   . Headache(784.0)   . History of colon polyps 2009   Dr. Orr/Colonoscopy -small cecal adenoma  . History of falling   . Hx of colonoscopy 2009   Dr Orr, [place]ajoy[place]ersrhoids & polyps  . Hyperlipidemia   . Hypertension   . Hypertensive heart disease with heart failure (HCC)   [place]al[place]   . Internal hemorrhoids 2009  Dr. Lajoyce [place]ers Colonoscopy  . Metabolic encephalopathy   . Mitral valve regurgitation   . Muscle weakness (generalized)   . Pneumonia   . Primary generalized (osteo)arthritis   . Reflux esophagitis   . Sepsis ([place]) 11/04/2018  . Unspecified hemorrhoids   . Unsteadiness on feet   . Vitamin D deficiency    Past Surgical History:  Procedure Laterality Date  . APPENDECTOMY    . CARDIAC  CATHETERIZATION  1997   REVEALED MILD TO MODERATE IRREGULARITIES. HIS LEFT EVNTRICULAR SYSTOLIC FUNCTION REVEALED NORMAL EJECTION FRACTION WITH EF OF 70%  . CATARACT EXTRACTION, BILATERAL    . COLONOSCOPY  multiple  . fatty tumor removal     benign  . HEMORROIDECTOMY    . HIP ARTHROPLASTY Left 08/17/2013   Procedure: ARTHROPLASTY BIPOLAR HIP;  Surgeon: Gearlean Alf, MD;  Location: [facility];  Service: Orthopedics;  Laterality: Left;  . INGUINAL HERNIA REPAIR Bilateral 2006  . lower back surgery  2006  . NASAL SEPTUM SURGERY    . TONSILLECTOMY    . UPPER GASTROINTESTINAL ENDOSCOPY      No Known Allergies  Allergies as of 04/06/2020   No Known Allergies     Medication List       Accurate as of April 06, 2020 11:59 PM. If you have any questions, ask your nurse or doctor.        acetaminophen 500 MG tablet Commonly known as: TYLENOL Take 500 mg by mouth every 8 (eight) hours as needed for mild pain or headache.   amLODipine 5 MG tablet Commonly known as: NORVASC Take 5 mg by mouth daily.   Anusol-HC 2.5 % rectal cream Generic drug: hydrocortisone Place 1 application rectally daily as needed for hemorrhoids or anal itching (USE PERINEAL APPLICATOR). With perineal applicator   aspirin EC 81 MG tablet Take 81 mg by mouth daily.   Carboxymethylcellulose Sod PF 0.5 % Soln Place 1 drop into both eyes daily as needed (dry eyes).   Deep Sea Nasal Spray 0.65 % nasal spray Generic drug: sodium chloride Place 1 spray into the nose daily as needed for congestion (BOTH NOSTRILS).   gabapentin 100 MG capsule Commonly known as: NEURONTIN Take 200 mg by mouth at bedtime.   hydroxypropyl methylcellulose / hypromellose 2.5 % ophthalmic solution Commonly known as: ISOPTO TEARS / GONIOVISC Place 1 drop into both eyes as needed for dry eyes.   levETIRAcetam 500 MG tablet Commonly known as: KEPPRA Take 1 tablet (500 mg total) by mouth 2 (two) times daily.   lisinopril 40 MG  tablet Commonly known as: ZESTRIL Take 40 mg by mouth daily.   loperamide 2 MG tablet Commonly known as: IMODIUM A-D Take 2 mg by mouth as needed for diarrhea or loose stools.   omeprazole 20 MG capsule Commonly known as: PRILOSEC Take 20 mg by mouth daily.   Robaxin 500 MG tablet Generic drug: methocarbamol Take 250 mg by mouth See admin instructions. Take 250 mg by mouth in the morning and an additional 250 mg once a day as needed for muscle spasms   rosuvastatin 5 MG tablet Commonly known as: CRESTOR Take 5 mg by mouth daily.   Thera-M Tabs Take 1 tablet by mouth daily.   traZODone 50 MG tablet Commonly known as: DESYREL Take 0.5 tablets (25 mg total) by mouth at bedtime.   vitamin B-12 1000 MCG tablet Commonly known as: CYANOCOBALAMIN Take 1,000 mcg by mouth daily.       Review of Systems  Constitutional: Negative for activity change, appetite change and fever.       #4Ibs weight gained in the past month  HENT: Positive for hearing loss. Negative for congestion and voice change.   Eyes: Negative for visual disturbance.  Respiratory: Negative for cough and shortness of breath.   Cardiovascular: Positive for leg swelling.  Gastrointestinal: Negative for abdominal pain, constipation and vomiting.  Genitourinary: Negative for difficulty urinating, dysuria and urgency.  Musculoskeletal: Positive for arthralgias, back pain and gait problem.       Lower back, right hip/thigh pain   Skin: Positive for rash. Negative for color change.  Neurological: Negative for seizures, speech difficulty, light-headedness and headaches.       Memory lapses. Tingling in toes.   Psychiatric/Behavioral: Negative for behavioral problems and sleep disturbance. The patient is not nervous/anxious.     Immunization History  Administered Date(s) Administered  . Influenza, High Dose Seasonal PF 03/19/2019, 03/19/2019  . Influenza-Unspecified 04/08/2016, 03/27/2020  . Moderna SARS-COVID-2  Vaccination 06/18/2019, 07/16/2019  . Pneumococcal Conjugate-13 06/02/2013  . Pneumococcal Polysaccharide-23 04/07/2001   Pertinent  Health Maintenance Due  Topic Date Due  . INFLUENZA VACCINE  Completed  . PNA vac Low Risk Adult  Completed   Fall Risk  11/03/2017 07/11/2016  Falls in the past year? Yes Yes  Number falls in past yr: 2 or more 2 or more  Injury with Fall? Yes Yes  Comment - broke toe on right foot  Risk Factor Category  High Fall Risk -  Follow up Education provided;Falls prevention discussed -   Functional Status Survey:    Vitals:   04/06/20 1018  BP: 136/72  Pulse: 74  Resp: 20  Temp: 97.8 F (36.6 C)  SpO2: 95%  Weight: 165 lb 11.2 oz (75.2 kg)  Height: _0  (1.702 m)   Body mass index is 25.95 kg/m. Physical Exam Vitals and nursing note reviewed.  Constitutional:      Appearance: Normal appearance.  HENT:     Head: Normocephalic and atraumatic.     Mouth/Throat:     Mouth: Mucous membranes are moist.  Eyes:     Extraocular Movements: Extraocular movements intact.     Conjunctiva/sclera: Conjunctivae normal.     Pupils: Pupils are equal, round, and reactive to light.  Cardiovascular:     Rate and Rhythm: Normal rate and regular rhythm.     Heart sounds: Murmur heard.   Pulmonary:     Breath sounds: No rales.  Abdominal:     General: Bowel sounds are normal.     Palpations: Abdomen is soft.     Tenderness: There is no abdominal tenderness.  Genitourinary:    Comments: External hemorrhoids, no bleeding or injury.  Musculoskeletal:     Cervical back: Normal range of motion and neck supple. No muscular tenderness.     Right lower leg: Edema present.     Left lower leg: Edema present.     Comments: Trace  edema BLE  Skin:    General: Skin is warm and dry.     Findings: Erythema and rash present.     Comments: Chronic venous insufficiency skin changes BLE. Medical left lower leg redness, scaly, and slightly warmth area w/o open areas.  Redness in R+L buttocks and scrotum.   Neurological:     General: No focal deficit present.     Mental Status: He is alert. Mental status is at baseline.     Gait: Gait abnormal.  Comments: Oriented to person, place.   Psychiatric:        Mood and Affect: Mood normal.        Behavior: Behavior normal.        Thought Content: Thought content normal.     Labs reviewed: Recent Labs    09/22/19 0333 09/22/19 0333 09/23/19 0619 09/23/19 0619 09/27/19 0000 09/27/19 0000 01/03/20 0000 01/12/20 0257 04/06/20 0000  NA 135   < > 137  --  136*   < > 134* 134* 137  K 3.9   < > 4.3   < > 4.1   < > 4.1 3.7 4.0  CL 101   < > 101   < > 101   < > 100 95* 104  CO2 23   < > 25   < > 29*  --  27*  --  25*  GLUCOSE 97  --  107*  --   --   --   --  131*  --   BUN 11   < > 18  --  15   < > _0 CREATININE 0.92   < > 1.03  --  0.9   < > 0.9 0.90 1.0  CALCIUM 8.8*   < > 9.0   < > 8.9  --  2.9*  --  8.6*   < > = values in this interval not displayed.   Recent Labs    09/18/19 2256 09/27/19 0000 04/06/20 0000  AST 39 20 16  ALT _1 ALKPHOS 71 60 66  BILITOT 0.8  --   --   PROT 6.3*  --   --   ALBUMIN 3.8 3.4* 3.4*   Recent Labs    09/22/19 0333 09/22/19 0333 09/23/19 0857 09/23/19 0857 09/27/19 0000 09/27/19 0000 01/03/20 0000 01/12/20 0250 01/12/20 0257  WBC 9.5   < > 8.8  --  10.8  --  6.1 11.6*  --   NEUTROABS 5.9   < > 5.8  --  7,096  --  3,440.00 10.0*  --   HGB 14.1   < > 15.5   < > 13.4*   < > 14.0 14.1 13.9  HCT 41.8   < > 46.2   < > 39*   < > 40* 40.7 41.0  MCV 95.9  --  96.7  --   --   --   --  93.3  --   PLT 208   < > 260   < > 230  --  188 174  --    < > = values in this interval not displayed.   Lab Results  Component Value Date   TSH 1.77 04/06/2020   No results found for: HGBA1C Lab Results  Component Value Date   CHOL 94 04/06/2020   HDL 37 04/06/2020   LDLCALC 41 04/06/2020   TRIG 79 04/06/2020   CHOLHDL 2.7 02/17/2017    Significant  Diagnostic Results in last 30 days:  No results found.  Assessment/Plan: Debility Generalized weakness noted, 04/05/20 LDL 41, Na 137, K 4.0, Bun 15, creat 0.99, eGFR 66, TSH 1. SNF [facility] for safety, care assistance, therapy to eval/tx.   Fall 04/05/20 fall when the patient was founding sitting in floor from attempting to transfer self from bed to w/c. Sit to stand life needed for transfer.   Candidiasis R+L buttocks and scrotum R+L, will apply Nystatin cream bic x 10 days.   Venous dermatitis  Brownish pigmented BLE, trace edema. Scaly, reddened, slightly warmth area medical LLE, no open area, will apply 0.5% Triamcinolone cream daily x 2 weeks.   New onset atrial fibrillation (HCC) Afib, heart rate is conrolled, not a candidate for anticoagulation,    Chronic diastolic heart failure (HCC) CHF, EF 22-33%, grade 1 diastolic dysfunction, off  Furosemide 78m qd, saw Cardiology 02/06/20  Hypertension Nurse reported Sbp 160s, Dbp in 80s, the patient denied HA, dizziness, chest pain/pressure, palpitation, generalized weakness, or change of vision, will continue amlodipine 536mqd, Lisinopril 4036md. Monitor Bp   CAD (coronary artery disease) Hx of CAD, hyperlipidemia, on Crestor   GERD (gastroesophageal reflux disease) GERD, takes Omeprazole 4m88m.    Insomnia Insomnia, stable, on Trazodone  Lower back pain Chronic lower back pain/right hip pain, on Gabapentin 200mg72m  Right hip pain OA, s/p left hip surgery, chronic right hip pain, w/c helps, prn Tylenol, Methocarbamol 250mg 18mdaily prn  Seizure (HCC) [place]: takes Keppra 500mg b49m   Family/ staff Communication: plan of care reviewed with the patient and charge nurse.   Labs/tests ordered:  none  Time spend 35 minutes.

## 2020-04-06 NOTE — Assessment & Plan Note (Addendum)
Generalized weakness noted, 04/05/20 LDL 41, Na 137, K 4.0, Bun 15, creat 0.99, eGFR 66, TSH 1. SNF [facility] for safety, care assistance, therapy to eval/tx.

## 2020-04-06 NOTE — Assessment & Plan Note (Signed)
Afib, heart rate is conrolled, not a candidate for anticoagulation,

## 2020-04-06 NOTE — Assessment & Plan Note (Signed)
OA, s/p left hip surgery, chronic right hip pain, w/c helps, prn Tylenol, Methocarbamol 250mg  qam/daily prn

## 2020-04-06 NOTE — Assessment & Plan Note (Signed)
GERD, takes Omeprazole 20mg  qd.

## 2020-04-06 NOTE — Assessment & Plan Note (Signed)
R+L buttocks and scrotum R+L, will apply Nystatin cream bic x 10 days.

## 2020-04-06 NOTE — Assessment & Plan Note (Signed)
Hx of CAD, hyperlipidemia, on Crestor

## 2020-04-06 NOTE — Assessment & Plan Note (Signed)
Insomnia, stable, on Trazodone

## 2020-04-06 NOTE — Assessment & Plan Note (Addendum)
Nurse reported Sbp 160s, Dbp in 80s, the patient denied HA, dizziness, chest pain/pressure, palpitation, generalized weakness, or change of vision, will continue amlodipine 5mg  qd, Lisinopril 40mg  qd. Monitor Bp

## 2020-04-06 NOTE — Assessment & Plan Note (Signed)
Seizure: takes Keppra 500mg  bid

## 2020-04-06 NOTE — Assessment & Plan Note (Signed)
Brownish pigmented BLE, trace edema. Scaly, reddened, slightly warmth area medical LLE, no open area, will apply 0.5% Triamcinolone cream daily x 2 weeks.

## 2020-04-06 NOTE — Assessment & Plan Note (Signed)
CHF, EF 14-38%, grade 1 diastolic dysfunction, off  Furosemide 40mg  qd, saw Cardiology 02/06/20

## 2020-04-06 NOTE — Assessment & Plan Note (Signed)
Chronic lower back pain/right hip pain,  on Gabapentin 200mg qd 

## 2020-04-09 ENCOUNTER — Encounter: Payer: Self-pay | Admitting: Internal Medicine

## 2020-04-09 ENCOUNTER — Encounter: Payer: Self-pay | Admitting: Nurse Practitioner

## 2020-04-09 ENCOUNTER — Non-Acute Institutional Stay (SKILLED_NURSING_FACILITY): Payer: Medicare HMO | Admitting: Internal Medicine

## 2020-04-09 DIAGNOSIS — R569 Unspecified convulsions: Secondary | ICD-10-CM

## 2020-04-09 DIAGNOSIS — R269 Unspecified abnormalities of gait and mobility: Secondary | ICD-10-CM | POA: Diagnosis not present

## 2020-04-09 DIAGNOSIS — I5032 Chronic diastolic (congestive) heart failure: Secondary | ICD-10-CM

## 2020-04-09 DIAGNOSIS — I480 Paroxysmal atrial fibrillation: Secondary | ICD-10-CM

## 2020-04-09 DIAGNOSIS — M25662 Stiffness of left knee, not elsewhere classified: Secondary | ICD-10-CM | POA: Diagnosis not present

## 2020-04-09 DIAGNOSIS — Z9181 History of falling: Secondary | ICD-10-CM | POA: Diagnosis not present

## 2020-04-09 DIAGNOSIS — R1312 Dysphagia, oropharyngeal phase: Secondary | ICD-10-CM | POA: Diagnosis not present

## 2020-04-09 DIAGNOSIS — I2583 Coronary atherosclerosis due to lipid rich plaque: Secondary | ICD-10-CM | POA: Diagnosis not present

## 2020-04-09 DIAGNOSIS — K219 Gastro-esophageal reflux disease without esophagitis: Secondary | ICD-10-CM | POA: Diagnosis not present

## 2020-04-09 DIAGNOSIS — A419 Sepsis, unspecified organism: Secondary | ICD-10-CM | POA: Diagnosis not present

## 2020-04-09 DIAGNOSIS — R2681 Unsteadiness on feet: Secondary | ICD-10-CM | POA: Diagnosis not present

## 2020-04-09 DIAGNOSIS — R1310 Dysphagia, unspecified: Secondary | ICD-10-CM | POA: Diagnosis not present

## 2020-04-09 DIAGNOSIS — M6281 Muscle weakness (generalized): Secondary | ICD-10-CM | POA: Diagnosis not present

## 2020-04-09 DIAGNOSIS — M150 Primary generalized (osteo)arthritis: Secondary | ICD-10-CM | POA: Diagnosis not present

## 2020-04-09 DIAGNOSIS — M25661 Stiffness of right knee, not elsewhere classified: Secondary | ICD-10-CM | POA: Diagnosis not present

## 2020-04-09 DIAGNOSIS — I2510 Atherosclerotic heart disease of native coronary artery without angina pectoris: Secondary | ICD-10-CM

## 2020-04-09 DIAGNOSIS — I10 Essential (primary) hypertension: Secondary | ICD-10-CM | POA: Diagnosis not present

## 2020-04-09 DIAGNOSIS — N3946 Mixed incontinence: Secondary | ICD-10-CM | POA: Diagnosis not present

## 2020-04-09 NOTE — Progress Notes (Signed)
Provider:  [name] MD Location:   [facility] Room Number: 907 Place of Service:  ALF [phone]  PCP: [name], MD Patient Care Team: [name], MD as PCP - General (Internal Medicine) Gatha Mayer, MD as Consulting Physician (Gastroenterology) [name], [name], MD as Consulting Physician (Cardiology) [name], MD as Attending Physician (Internal Medicine) Mast, Man X, NP as Nurse Practitioner (Internal Medicine)  Extended Emergency Contact Information Primary Emergency Contact: Spurgeon,[name] Address: [place], [place] [address] [name] of [place] Phone: [phone] Mobile Phone: [phone] Relation: Daughter Secondary Emergency Contact: Catena,[name] Address: [place] [address] [name] of [place] Phone: [phone] Mobile Phone: [phone] Relation: Son  Code Status: DNR Goals of Care: Advanced Directive information Advanced Directives 02/15/2020  Does Patient Have a Medical Advance Directive? Yes  Type of Paramedic of [place];Living will  Does patient want to make changes to medical advance directive? No - Patient declined  Copy of [place] in Chart? Yes - validated most recent copy scanned in chart (See row information)  Pre-existing out of facility DNR order (yellow form or pink MOST form) -      Chief Complaint  Patient presents with  . New Admit To SNF    Admission to SNF    HPI: Patient is a 86 y.o. male seen today for admission to [facility]  Patient has a history of BPH, bradycardia, CAD, hypertension, diastolic CHF, GERD,H/o Left Sphenoid MeningiomaAnd Chronic Right Hip PainAnd recent diagnosis of Seizures  Patient was in AL but has had 2 more falls.  Most of them sliding from his wheelchair.  But unable to get up after that.  The nurses tired.  They would that he  is going to need more help.  He was admitted initially in SNF for observation.  But patient is saying that he does not want to do aggressive therapy and does not think that he wants to go back in AL. No other complaints today.  Still feeling weak to do his transfers by himself. No shortness of breath Has  some cough and lower extremity edema.  Past Medical History:  Diagnosis Date  . Allergic rhinitis   . Amnesia   . Atherosclerotic heart disease of native coronary artery without angina pectoris   . BPH (benign prostatic hyperplasia)   . Bradycardia   . Chronic diastolic (congestive) heart failure ([place])   . Colon polyps   . Coronary artery disease    MODERATE  . Dizziness   . Edema of lower extremity   . Erectile dysfunction   . H. pylori infection    Hx of   . Hard of hearing   . Headache(784.0)   . History of colon polyps 2009   Dr. Orr/Colonoscopy -small cecal adenoma  . History of falling   . Hx of colonoscopy 2009   Dr Lajoyce Corners, hemorrhoids & polyps  . Hyperlipidemia   . Hypertension   . Hypertensive heart disease with heart failure ([place])   . Hypokalemia   . Internal hemorrhoids 2009   Dr. Lajoyce Corners Colonoscopy  . Metabolic encephalopathy   . Mitral valve regurgitation   . Muscle weakness (generalized)   . Pneumonia   . Primary generalized (osteo)arthritis   . Reflux esophagitis   . Sepsis ([place]) 11/04/2018  .  Unspecified hemorrhoids   . Unsteadiness on feet   . Vitamin D deficiency    Past Surgical History:  Procedure Laterality Date  . APPENDECTOMY    . CARDIAC CATHETERIZATION  1997   REVEALED MILD TO MODERATE IRREGULARITIES. HIS LEFT EVNTRICULAR SYSTOLIC FUNCTION REVEALED NORMAL EJECTION FRACTION WITH EF OF 70%  . CATARACT EXTRACTION, BILATERAL    . COLONOSCOPY  multiple  . fatty tumor removal     benign  . HEMORROIDECTOMY    . HIP ARTHROPLASTY Left 08/17/2013   Procedure: ARTHROPLASTY BIPOLAR HIP;  Surgeon: Gearlean Alf, MD;  Location: [facility];  Service:  Orthopedics;  Laterality: Left;  . INGUINAL HERNIA REPAIR Bilateral 2006  . lower back surgery  2006  . NASAL SEPTUM SURGERY    . TONSILLECTOMY    . UPPER GASTROINTESTINAL ENDOSCOPY      reports that he quit smoking about 44 years ago. He has never used smokeless tobacco. He reports current alcohol use. He reports that he does not use drugs. Social History   Socioeconomic History  . Marital status: Widowed    Spouse name: Not on file  . Number of children: 3  . Years of education: Not on file  . Highest education level: Not on file  Occupational History  . Not on file  Tobacco Use  . Smoking status: Former Smoker    Quit date: 10/25/1975    Years since quitting: 44.4  . Smokeless tobacco: Never Used  Vaping Use  . Vaping Use: Never used  Substance and Sexual Activity  . Alcohol use: Yes    Comment: occ 6-7 times a year  . Drug use: No  . Sexual activity: Not on file  Other Topics Concern  . Not on file  Social History Narrative   Retired and widowed   Lives at [facility]   Right handed   Social Determinants of Health   Financial Resource Strain:   . Difficulty of Paying Living Expenses: Not on file  Food Insecurity:   . Worried About Charity fundraiser in the Last Year: Not on file  . Ran Out of Food in the Last Year: Not on file  Transportation Needs:   . Lack of Transportation (Medical): Not on file  . Lack of Transportation (Non-Medical): Not on file  Physical Activity:   . Days of Exercise per Week: Not on file  . Minutes of Exercise per Session: Not on file  Stress:   . Feeling of Stress : Not on file  Social Connections:   . Frequency of Communication with Friends and Family: Not on file  . Frequency of Social Gatherings with Friends and Family: Not on file  . Attends Religious Services: Not on file  . Active Member of Clubs or Organizations: Not on file  . Attends Archivist Meetings: Not on file  . Marital Status: Not on file    Intimate Partner Violence:   . Fear of Current or Ex-Partner: Not on file  . Emotionally Abused: Not on file  . Physically Abused: Not on file  . Sexually Abused: Not on file    Functional Status Survey:    Family History  Problem Relation Age of Onset  . Colon cancer Mother 31       second cancer in 4's deceased after emergency surgery  . Breast cancer Sister   . Seizures Neg Hx     Health Maintenance  Topic Date Due  . TETANUS/TDAP  08/15/2023  .  INFLUENZA VACCINE  Completed  . COVID-19 Vaccine  Completed  . PNA vac Low Risk Adult  Completed    No Known Allergies  Allergies as of 04/09/2020   No Known Allergies     Medication List       Accurate as of April 09, 2020 12:09 PM. If you have any questions, ask your nurse or doctor.        STOP taking these medications   aspirin EC 81 MG tablet Stopped by: [name], MD   hydroxypropyl methylcellulose / hypromellose 2.5 % ophthalmic solution Commonly known as: ISOPTO TEARS / GONIOVISC Stopped by: [name], MD     TAKE these medications   acetaminophen 500 MG tablet Commonly known as: TYLENOL Take 500 mg by mouth every 8 (eight) hours as needed for mild pain or headache.   amLODipine 5 MG tablet Commonly known as: NORVASC Take 5 mg by mouth daily.   Anusol-HC 2.5 % rectal cream Generic drug: hydrocortisone Place 1 application rectally daily as needed for hemorrhoids or anal itching (USE PERINEAL APPLICATOR). With perineal applicator   Carboxymethylcellulose Sod PF 0.5 % Soln Place 1 drop into both eyes daily as needed (dry eyes).   Deep Sea Nasal Spray 0.65 % nasal spray Generic drug: sodium chloride Place 1 spray into the nose daily as needed for congestion (BOTH NOSTRILS).   gabapentin 100 MG capsule Commonly known as: NEURONTIN Take 200 mg by mouth at bedtime.   levETIRAcetam 500 MG tablet Commonly known as: KEPPRA Take 1 tablet (500 mg total) by mouth 2 (two) times daily.    lisinopril 40 MG tablet Commonly known as: ZESTRIL Take 40 mg by mouth daily.   omeprazole 20 MG capsule Commonly known as: PRILOSEC Take 20 mg by mouth daily.   Robaxin 500 MG tablet Generic drug: methocarbamol Take 250 mg by mouth See admin instructions. Take 250 mg by mouth in the morning and an additional 250 mg once a day as needed for muscle spasms   rosuvastatin 5 MG tablet Commonly known as: CRESTOR Take 5 mg by mouth daily.   Thera-M Tabs Take 1 tablet by mouth daily.   traZODone 50 MG tablet Commonly known as: DESYREL Take 0.5 tablets (25 mg total) by mouth at bedtime.   triamcinolone cream 0.1 % Commonly known as: KENALOG Apply 1 application topically 2 (two) times daily.   vitamin B-12 1000 MCG tablet Commonly known as: CYANOCOBALAMIN Take 1,000 mcg by mouth daily.       Review of Systems  Constitutional: Positive for activity change.  HENT: Negative.   Respiratory: Positive for cough.   Cardiovascular: Positive for leg swelling.  Gastrointestinal: Negative.   Genitourinary: Positive for frequency.  Musculoskeletal: Positive for gait problem.  Skin: Negative.   Neurological: Positive for weakness. Negative for dizziness.  Psychiatric/Behavioral: Positive for dysphoric mood.    Vitals:   04/09/20 1159  BP: (!) 141/64  Pulse: 76  Resp: 20  Temp: 97.6 F (36.4 C)  SpO2: 93%  Weight: 168 lb 6.4 oz (76.4 kg)  Height: 5\' 7"  (1.702 m)   Body mass index is 26.38 kg/m. Physical Exam Vitals reviewed.  Constitutional:      Appearance: Normal appearance.  HENT:     Head: Normocephalic.     Nose: Nose normal.     Mouth/Throat:     Mouth: Mucous membranes are moist.     Pharynx: Oropharynx is clear.  Eyes:     Pupils: Pupils are equal,  round, and reactive to light.  Cardiovascular:     Rate and Rhythm: Normal rate and regular rhythm.     Pulses: Normal pulses.  Pulmonary:     Effort: Pulmonary effort is normal.     Breath sounds: Normal  breath sounds.  Abdominal:     General: Abdomen is flat. Bowel sounds are normal.     Palpations: Abdomen is soft.  Musculoskeletal:        General: Swelling present.     Cervical back: Neck supple.  Skin:    General: Skin is warm.  Neurological:     General: No focal deficit present.     Mental Status: He is alert and oriented to person, place, and time.  Psychiatric:        Mood and Affect: Mood normal.        Behavior: Behavior normal.        Thought Content: Thought content normal.     Labs reviewed: Basic Metabolic Panel: Recent Labs    09/22/19 0333 09/22/19 0333 09/23/19 0619 09/23/19 0619 09/27/19 0000 09/27/19 0000 01/03/20 0000 01/12/20 0257 04/06/20 0000  NA 135   < > 137  --  136*   < > 134* 134* 137  K 3.9   < > 4.3   < > 4.1   < > 4.1 3.7 4.0  CL 101   < > 101   < > 101   < > 100 95* 104  CO2 23   < > 25   < > 29*  --  27*  --  25*  GLUCOSE 97  --  107*  --   --   --   --  131*  --   BUN 11   < > 18  --  15   < > 13 13 15   CREATININE 0.92   < > 1.03  --  0.9   < > 0.9 0.90 1.0  CALCIUM 8.8*   < > 9.0   < > 8.9  --  2.9*  --  8.6*   < > = values in this interval not displayed.   Liver Function Tests: Recent Labs    09/18/19 2256 09/27/19 0000 04/06/20 0000  AST 39 20 16  ALT 27 23 16   ALKPHOS 71 60 66  BILITOT 0.8  --   --   PROT 6.3*  --   --   ALBUMIN 3.8 3.4* 3.4*   No results for input(s): LIPASE, AMYLASE in the last 8760 hours. No results for input(s): AMMONIA in the last 8760 hours. CBC: Recent Labs    09/22/19 0333 09/22/19 0333 09/23/19 0857 09/23/19 0857 09/27/19 0000 09/27/19 0000 01/03/20 0000 01/12/20 0250 01/12/20 0257  WBC 9.5   < > 8.8  --  10.8  --  6.1 11.6*  --   NEUTROABS 5.9   < > 5.8  --  7,096  --  3,440.00 10.0*  --   HGB 14.1   < > 15.5   < > 13.4*   < > 14.0 14.1 13.9  HCT 41.8   < > 46.2   < > 39*   < > 40* 40.7 41.0  MCV 95.9  --  96.7  --   --   --   --  93.3  --   PLT 208   < > 260   < > 230  --  188  174  --    < > = values in this  interval not displayed.   Cardiac Enzymes: No results for input(s): CKTOTAL, CKMB, CKMBINDEX, TROPONINI in the last 8760 hours. BNP: Invalid input(s): POCBNP No results found for: HGBA1C Lab Results  Component Value Date   TSH 1.77 04/06/2020   Lab Results  Component Value Date   VITAMINB12 1,261 (H) 11/01/2018   No results found for: FOLATE No results found for: IRON, TIBC, FERRITIN  Imaging and Procedures obtained prior to SNF admission: CT Head Wo Contrast  Result Date: 01/12/2020 CLINICAL DATA:  Facial trauma.  Possible seizure EXAM: CT HEAD WITHOUT CONTRAST TECHNIQUE: Contiguous axial images were obtained from the base of the skull through the vertex without intravenous contrast. COMPARISON:  Brain MRI 09/19/2019 FINDINGS: Brain: No evidence of acute infarction, hemorrhage, hydrocephalus, or extra-axial collection. Chronic low-density without volume loss in the anterior left temporal white matter, evaluated by recent brain MRI and chronic when compared with a 2017 brain MRI which shows an underlying meningioma at the left middle cranial fossa. The meningioma is not clearly detected on this study. Vascular: No hyperdense vessel or unexpected calcification. Skull: Normal. Negative for fracture or focal lesion. Sinuses/Orbits: No acute finding. IMPRESSION: 1. No posttraumatic finding. 2. Chronic left temporal lobe edema, please reference brain MRI from April 2021 and May 2017. Electronically Signed   By: Monte Fantasia M.D.   On: 01/12/2020 04:12    Assessment/Plan Chronic diastolic heart failure (HCC) Off Lasix as per his request Has lower extremity edema but no shortness of breath  PAF (paroxysmal atrial fibrillation) ([place]) Was not started on anticoagulation due to his age and frailty Essential hypertension Continue on lisinopril and Norvasc Coronary artery disease due to lipid rich plaque Aspirin was discontinued as he was refusing to take  it On statin Gastroesophageal reflux disease, unspecified whether esophagitis present On Prilosec Gait abnormality Therapy if he agrees Continue to stay in SNF Wheelchair depedenrt Seizure [facility]) On Keppra Low back pain On Neurontin and Robaxin Insomnia On Trazodone Hyperlipidemia LDL41 Low back Pain On Robaxin and Neurontin  All labs reviewed were in good limits  Family/ staff Communication:   Labs/tests ordered:

## 2020-04-10 ENCOUNTER — Encounter: Payer: Self-pay | Admitting: Internal Medicine

## 2020-04-10 DIAGNOSIS — N3946 Mixed incontinence: Secondary | ICD-10-CM | POA: Diagnosis not present

## 2020-04-10 DIAGNOSIS — M25662 Stiffness of left knee, not elsewhere classified: Secondary | ICD-10-CM | POA: Diagnosis not present

## 2020-04-10 DIAGNOSIS — R1312 Dysphagia, oropharyngeal phase: Secondary | ICD-10-CM | POA: Diagnosis not present

## 2020-04-10 DIAGNOSIS — Z9181 History of falling: Secondary | ICD-10-CM | POA: Diagnosis not present

## 2020-04-10 DIAGNOSIS — M150 Primary generalized (osteo)arthritis: Secondary | ICD-10-CM | POA: Diagnosis not present

## 2020-04-10 DIAGNOSIS — A419 Sepsis, unspecified organism: Secondary | ICD-10-CM | POA: Diagnosis not present

## 2020-04-10 DIAGNOSIS — M25661 Stiffness of right knee, not elsewhere classified: Secondary | ICD-10-CM | POA: Diagnosis not present

## 2020-04-10 DIAGNOSIS — R2681 Unsteadiness on feet: Secondary | ICD-10-CM | POA: Diagnosis not present

## 2020-04-10 DIAGNOSIS — M6281 Muscle weakness (generalized): Secondary | ICD-10-CM | POA: Diagnosis not present

## 2020-04-10 DIAGNOSIS — R1310 Dysphagia, unspecified: Secondary | ICD-10-CM | POA: Diagnosis not present

## 2020-04-11 DIAGNOSIS — M25662 Stiffness of left knee, not elsewhere classified: Secondary | ICD-10-CM | POA: Diagnosis not present

## 2020-04-11 DIAGNOSIS — M6281 Muscle weakness (generalized): Secondary | ICD-10-CM | POA: Diagnosis not present

## 2020-04-11 DIAGNOSIS — A419 Sepsis, unspecified organism: Secondary | ICD-10-CM | POA: Diagnosis not present

## 2020-04-11 DIAGNOSIS — M25661 Stiffness of right knee, not elsewhere classified: Secondary | ICD-10-CM | POA: Diagnosis not present

## 2020-04-11 DIAGNOSIS — N3946 Mixed incontinence: Secondary | ICD-10-CM | POA: Diagnosis not present

## 2020-04-11 DIAGNOSIS — R1310 Dysphagia, unspecified: Secondary | ICD-10-CM | POA: Diagnosis not present

## 2020-04-11 DIAGNOSIS — R2681 Unsteadiness on feet: Secondary | ICD-10-CM | POA: Diagnosis not present

## 2020-04-11 DIAGNOSIS — M150 Primary generalized (osteo)arthritis: Secondary | ICD-10-CM | POA: Diagnosis not present

## 2020-04-11 DIAGNOSIS — R1312 Dysphagia, oropharyngeal phase: Secondary | ICD-10-CM | POA: Diagnosis not present

## 2020-04-11 DIAGNOSIS — Z9181 History of falling: Secondary | ICD-10-CM | POA: Diagnosis not present

## 2020-04-12 DIAGNOSIS — M25662 Stiffness of left knee, not elsewhere classified: Secondary | ICD-10-CM | POA: Diagnosis not present

## 2020-04-12 DIAGNOSIS — N3946 Mixed incontinence: Secondary | ICD-10-CM | POA: Diagnosis not present

## 2020-04-12 DIAGNOSIS — R2681 Unsteadiness on feet: Secondary | ICD-10-CM | POA: Diagnosis not present

## 2020-04-12 DIAGNOSIS — Z9181 History of falling: Secondary | ICD-10-CM | POA: Diagnosis not present

## 2020-04-12 DIAGNOSIS — R1312 Dysphagia, oropharyngeal phase: Secondary | ICD-10-CM | POA: Diagnosis not present

## 2020-04-12 DIAGNOSIS — R1310 Dysphagia, unspecified: Secondary | ICD-10-CM | POA: Diagnosis not present

## 2020-04-12 DIAGNOSIS — M6281 Muscle weakness (generalized): Secondary | ICD-10-CM | POA: Diagnosis not present

## 2020-04-12 DIAGNOSIS — M25661 Stiffness of right knee, not elsewhere classified: Secondary | ICD-10-CM | POA: Diagnosis not present

## 2020-04-12 DIAGNOSIS — A419 Sepsis, unspecified organism: Secondary | ICD-10-CM | POA: Diagnosis not present

## 2020-04-12 DIAGNOSIS — M150 Primary generalized (osteo)arthritis: Secondary | ICD-10-CM | POA: Diagnosis not present

## 2020-04-13 DIAGNOSIS — M6281 Muscle weakness (generalized): Secondary | ICD-10-CM | POA: Diagnosis not present

## 2020-04-13 DIAGNOSIS — R2681 Unsteadiness on feet: Secondary | ICD-10-CM | POA: Diagnosis not present

## 2020-04-13 DIAGNOSIS — M25662 Stiffness of left knee, not elsewhere classified: Secondary | ICD-10-CM | POA: Diagnosis not present

## 2020-04-13 DIAGNOSIS — Z9181 History of falling: Secondary | ICD-10-CM | POA: Diagnosis not present

## 2020-04-13 DIAGNOSIS — M25661 Stiffness of right knee, not elsewhere classified: Secondary | ICD-10-CM | POA: Diagnosis not present

## 2020-04-13 DIAGNOSIS — M150 Primary generalized (osteo)arthritis: Secondary | ICD-10-CM | POA: Diagnosis not present

## 2020-04-13 DIAGNOSIS — R1312 Dysphagia, oropharyngeal phase: Secondary | ICD-10-CM | POA: Diagnosis not present

## 2020-04-13 DIAGNOSIS — N3946 Mixed incontinence: Secondary | ICD-10-CM | POA: Diagnosis not present

## 2020-04-13 DIAGNOSIS — A419 Sepsis, unspecified organism: Secondary | ICD-10-CM | POA: Diagnosis not present

## 2020-04-13 DIAGNOSIS — R1310 Dysphagia, unspecified: Secondary | ICD-10-CM | POA: Diagnosis not present

## 2020-04-16 DIAGNOSIS — M25662 Stiffness of left knee, not elsewhere classified: Secondary | ICD-10-CM | POA: Diagnosis not present

## 2020-04-16 DIAGNOSIS — I5032 Chronic diastolic (congestive) heart failure: Secondary | ICD-10-CM | POA: Diagnosis not present

## 2020-04-16 DIAGNOSIS — R2681 Unsteadiness on feet: Secondary | ICD-10-CM | POA: Diagnosis not present

## 2020-04-16 DIAGNOSIS — R32 Unspecified urinary incontinence: Secondary | ICD-10-CM | POA: Diagnosis not present

## 2020-04-16 DIAGNOSIS — N3946 Mixed incontinence: Secondary | ICD-10-CM | POA: Diagnosis not present

## 2020-04-16 DIAGNOSIS — R29898 Other symptoms and signs involving the musculoskeletal system: Secondary | ICD-10-CM | POA: Diagnosis not present

## 2020-04-16 DIAGNOSIS — M150 Primary generalized (osteo)arthritis: Secondary | ICD-10-CM | POA: Diagnosis not present

## 2020-04-16 DIAGNOSIS — M6281 Muscle weakness (generalized): Secondary | ICD-10-CM | POA: Diagnosis not present

## 2020-04-16 DIAGNOSIS — M25661 Stiffness of right knee, not elsewhere classified: Secondary | ICD-10-CM | POA: Diagnosis not present

## 2020-04-16 DIAGNOSIS — Z9181 History of falling: Secondary | ICD-10-CM | POA: Diagnosis not present

## 2020-04-17 DIAGNOSIS — R29898 Other symptoms and signs involving the musculoskeletal system: Secondary | ICD-10-CM | POA: Diagnosis not present

## 2020-04-17 DIAGNOSIS — M150 Primary generalized (osteo)arthritis: Secondary | ICD-10-CM | POA: Diagnosis not present

## 2020-04-17 DIAGNOSIS — I5032 Chronic diastolic (congestive) heart failure: Secondary | ICD-10-CM | POA: Diagnosis not present

## 2020-04-17 DIAGNOSIS — R2681 Unsteadiness on feet: Secondary | ICD-10-CM | POA: Diagnosis not present

## 2020-04-17 DIAGNOSIS — N3946 Mixed incontinence: Secondary | ICD-10-CM | POA: Diagnosis not present

## 2020-04-17 DIAGNOSIS — M6281 Muscle weakness (generalized): Secondary | ICD-10-CM | POA: Diagnosis not present

## 2020-04-17 DIAGNOSIS — Z9181 History of falling: Secondary | ICD-10-CM | POA: Diagnosis not present

## 2020-04-17 DIAGNOSIS — R32 Unspecified urinary incontinence: Secondary | ICD-10-CM | POA: Diagnosis not present

## 2020-04-17 DIAGNOSIS — M25662 Stiffness of left knee, not elsewhere classified: Secondary | ICD-10-CM | POA: Diagnosis not present

## 2020-04-17 DIAGNOSIS — M25661 Stiffness of right knee, not elsewhere classified: Secondary | ICD-10-CM | POA: Diagnosis not present

## 2020-04-18 DIAGNOSIS — I5032 Chronic diastolic (congestive) heart failure: Secondary | ICD-10-CM | POA: Diagnosis not present

## 2020-04-18 DIAGNOSIS — M150 Primary generalized (osteo)arthritis: Secondary | ICD-10-CM | POA: Diagnosis not present

## 2020-04-18 DIAGNOSIS — Z9181 History of falling: Secondary | ICD-10-CM | POA: Diagnosis not present

## 2020-04-18 DIAGNOSIS — R29898 Other symptoms and signs involving the musculoskeletal system: Secondary | ICD-10-CM | POA: Diagnosis not present

## 2020-04-18 DIAGNOSIS — M25662 Stiffness of left knee, not elsewhere classified: Secondary | ICD-10-CM | POA: Diagnosis not present

## 2020-04-18 DIAGNOSIS — M25661 Stiffness of right knee, not elsewhere classified: Secondary | ICD-10-CM | POA: Diagnosis not present

## 2020-04-18 DIAGNOSIS — R32 Unspecified urinary incontinence: Secondary | ICD-10-CM | POA: Diagnosis not present

## 2020-04-18 DIAGNOSIS — R2681 Unsteadiness on feet: Secondary | ICD-10-CM | POA: Diagnosis not present

## 2020-04-18 DIAGNOSIS — N3946 Mixed incontinence: Secondary | ICD-10-CM | POA: Diagnosis not present

## 2020-04-18 DIAGNOSIS — M6281 Muscle weakness (generalized): Secondary | ICD-10-CM | POA: Diagnosis not present

## 2020-04-19 DIAGNOSIS — Z9181 History of falling: Secondary | ICD-10-CM | POA: Diagnosis not present

## 2020-04-19 DIAGNOSIS — R32 Unspecified urinary incontinence: Secondary | ICD-10-CM | POA: Diagnosis not present

## 2020-04-19 DIAGNOSIS — N3946 Mixed incontinence: Secondary | ICD-10-CM | POA: Diagnosis not present

## 2020-04-19 DIAGNOSIS — I5032 Chronic diastolic (congestive) heart failure: Secondary | ICD-10-CM | POA: Diagnosis not present

## 2020-04-19 DIAGNOSIS — M6281 Muscle weakness (generalized): Secondary | ICD-10-CM | POA: Diagnosis not present

## 2020-04-19 DIAGNOSIS — M25661 Stiffness of right knee, not elsewhere classified: Secondary | ICD-10-CM | POA: Diagnosis not present

## 2020-04-19 DIAGNOSIS — M150 Primary generalized (osteo)arthritis: Secondary | ICD-10-CM | POA: Diagnosis not present

## 2020-04-19 DIAGNOSIS — R2681 Unsteadiness on feet: Secondary | ICD-10-CM | POA: Diagnosis not present

## 2020-04-19 DIAGNOSIS — M25662 Stiffness of left knee, not elsewhere classified: Secondary | ICD-10-CM | POA: Diagnosis not present

## 2020-04-19 DIAGNOSIS — R29898 Other symptoms and signs involving the musculoskeletal system: Secondary | ICD-10-CM | POA: Diagnosis not present

## 2020-04-20 DIAGNOSIS — M25662 Stiffness of left knee, not elsewhere classified: Secondary | ICD-10-CM | POA: Diagnosis not present

## 2020-04-20 DIAGNOSIS — M150 Primary generalized (osteo)arthritis: Secondary | ICD-10-CM | POA: Diagnosis not present

## 2020-04-20 DIAGNOSIS — Z9181 History of falling: Secondary | ICD-10-CM | POA: Diagnosis not present

## 2020-04-20 DIAGNOSIS — M6281 Muscle weakness (generalized): Secondary | ICD-10-CM | POA: Diagnosis not present

## 2020-04-20 DIAGNOSIS — R29898 Other symptoms and signs involving the musculoskeletal system: Secondary | ICD-10-CM | POA: Diagnosis not present

## 2020-04-20 DIAGNOSIS — N3946 Mixed incontinence: Secondary | ICD-10-CM | POA: Diagnosis not present

## 2020-04-20 DIAGNOSIS — M25661 Stiffness of right knee, not elsewhere classified: Secondary | ICD-10-CM | POA: Diagnosis not present

## 2020-04-20 DIAGNOSIS — R2681 Unsteadiness on feet: Secondary | ICD-10-CM | POA: Diagnosis not present

## 2020-04-20 DIAGNOSIS — R32 Unspecified urinary incontinence: Secondary | ICD-10-CM | POA: Diagnosis not present

## 2020-04-20 DIAGNOSIS — I5032 Chronic diastolic (congestive) heart failure: Secondary | ICD-10-CM | POA: Diagnosis not present

## 2020-04-21 DIAGNOSIS — I5032 Chronic diastolic (congestive) heart failure: Secondary | ICD-10-CM | POA: Diagnosis not present

## 2020-04-21 DIAGNOSIS — M150 Primary generalized (osteo)arthritis: Secondary | ICD-10-CM | POA: Diagnosis not present

## 2020-04-21 DIAGNOSIS — N3946 Mixed incontinence: Secondary | ICD-10-CM | POA: Diagnosis not present

## 2020-04-21 DIAGNOSIS — M25661 Stiffness of right knee, not elsewhere classified: Secondary | ICD-10-CM | POA: Diagnosis not present

## 2020-04-21 DIAGNOSIS — M25662 Stiffness of left knee, not elsewhere classified: Secondary | ICD-10-CM | POA: Diagnosis not present

## 2020-04-21 DIAGNOSIS — M6281 Muscle weakness (generalized): Secondary | ICD-10-CM | POA: Diagnosis not present

## 2020-04-21 DIAGNOSIS — R29898 Other symptoms and signs involving the musculoskeletal system: Secondary | ICD-10-CM | POA: Diagnosis not present

## 2020-04-21 DIAGNOSIS — R32 Unspecified urinary incontinence: Secondary | ICD-10-CM | POA: Diagnosis not present

## 2020-04-21 DIAGNOSIS — R2681 Unsteadiness on feet: Secondary | ICD-10-CM | POA: Diagnosis not present

## 2020-04-21 DIAGNOSIS — Z9181 History of falling: Secondary | ICD-10-CM | POA: Diagnosis not present

## 2020-04-23 DIAGNOSIS — I5032 Chronic diastolic (congestive) heart failure: Secondary | ICD-10-CM | POA: Diagnosis not present

## 2020-04-23 DIAGNOSIS — M25661 Stiffness of right knee, not elsewhere classified: Secondary | ICD-10-CM | POA: Diagnosis not present

## 2020-04-23 DIAGNOSIS — M6281 Muscle weakness (generalized): Secondary | ICD-10-CM | POA: Diagnosis not present

## 2020-04-23 DIAGNOSIS — M150 Primary generalized (osteo)arthritis: Secondary | ICD-10-CM | POA: Diagnosis not present

## 2020-04-23 DIAGNOSIS — Z9181 History of falling: Secondary | ICD-10-CM | POA: Diagnosis not present

## 2020-04-23 DIAGNOSIS — R29898 Other symptoms and signs involving the musculoskeletal system: Secondary | ICD-10-CM | POA: Diagnosis not present

## 2020-04-23 DIAGNOSIS — M25662 Stiffness of left knee, not elsewhere classified: Secondary | ICD-10-CM | POA: Diagnosis not present

## 2020-04-23 DIAGNOSIS — R32 Unspecified urinary incontinence: Secondary | ICD-10-CM | POA: Diagnosis not present

## 2020-04-23 DIAGNOSIS — R2681 Unsteadiness on feet: Secondary | ICD-10-CM | POA: Diagnosis not present

## 2020-04-23 DIAGNOSIS — N3946 Mixed incontinence: Secondary | ICD-10-CM | POA: Diagnosis not present

## 2020-04-24 DIAGNOSIS — I5032 Chronic diastolic (congestive) heart failure: Secondary | ICD-10-CM | POA: Diagnosis not present

## 2020-04-24 DIAGNOSIS — M25661 Stiffness of right knee, not elsewhere classified: Secondary | ICD-10-CM | POA: Diagnosis not present

## 2020-04-24 DIAGNOSIS — M150 Primary generalized (osteo)arthritis: Secondary | ICD-10-CM | POA: Diagnosis not present

## 2020-04-24 DIAGNOSIS — R32 Unspecified urinary incontinence: Secondary | ICD-10-CM | POA: Diagnosis not present

## 2020-04-24 DIAGNOSIS — N3946 Mixed incontinence: Secondary | ICD-10-CM | POA: Diagnosis not present

## 2020-04-24 DIAGNOSIS — R2681 Unsteadiness on feet: Secondary | ICD-10-CM | POA: Diagnosis not present

## 2020-04-24 DIAGNOSIS — Z9181 History of falling: Secondary | ICD-10-CM | POA: Diagnosis not present

## 2020-04-24 DIAGNOSIS — M25662 Stiffness of left knee, not elsewhere classified: Secondary | ICD-10-CM | POA: Diagnosis not present

## 2020-04-24 DIAGNOSIS — R29898 Other symptoms and signs involving the musculoskeletal system: Secondary | ICD-10-CM | POA: Diagnosis not present

## 2020-04-24 DIAGNOSIS — M6281 Muscle weakness (generalized): Secondary | ICD-10-CM | POA: Diagnosis not present

## 2020-04-25 DIAGNOSIS — M25662 Stiffness of left knee, not elsewhere classified: Secondary | ICD-10-CM | POA: Diagnosis not present

## 2020-04-25 DIAGNOSIS — R2681 Unsteadiness on feet: Secondary | ICD-10-CM | POA: Diagnosis not present

## 2020-04-25 DIAGNOSIS — N3946 Mixed incontinence: Secondary | ICD-10-CM | POA: Diagnosis not present

## 2020-04-25 DIAGNOSIS — R32 Unspecified urinary incontinence: Secondary | ICD-10-CM | POA: Diagnosis not present

## 2020-04-25 DIAGNOSIS — Z9181 History of falling: Secondary | ICD-10-CM | POA: Diagnosis not present

## 2020-04-25 DIAGNOSIS — R29898 Other symptoms and signs involving the musculoskeletal system: Secondary | ICD-10-CM | POA: Diagnosis not present

## 2020-04-25 DIAGNOSIS — M6281 Muscle weakness (generalized): Secondary | ICD-10-CM | POA: Diagnosis not present

## 2020-04-25 DIAGNOSIS — I5032 Chronic diastolic (congestive) heart failure: Secondary | ICD-10-CM | POA: Diagnosis not present

## 2020-04-25 DIAGNOSIS — M25661 Stiffness of right knee, not elsewhere classified: Secondary | ICD-10-CM | POA: Diagnosis not present

## 2020-04-25 DIAGNOSIS — M150 Primary generalized (osteo)arthritis: Secondary | ICD-10-CM | POA: Diagnosis not present

## 2020-04-26 DIAGNOSIS — M25662 Stiffness of left knee, not elsewhere classified: Secondary | ICD-10-CM | POA: Diagnosis not present

## 2020-04-26 DIAGNOSIS — M150 Primary generalized (osteo)arthritis: Secondary | ICD-10-CM | POA: Diagnosis not present

## 2020-04-26 DIAGNOSIS — Z9181 History of falling: Secondary | ICD-10-CM | POA: Diagnosis not present

## 2020-04-26 DIAGNOSIS — R29898 Other symptoms and signs involving the musculoskeletal system: Secondary | ICD-10-CM | POA: Diagnosis not present

## 2020-04-26 DIAGNOSIS — N3946 Mixed incontinence: Secondary | ICD-10-CM | POA: Diagnosis not present

## 2020-04-26 DIAGNOSIS — I5032 Chronic diastolic (congestive) heart failure: Secondary | ICD-10-CM | POA: Diagnosis not present

## 2020-04-26 DIAGNOSIS — R2681 Unsteadiness on feet: Secondary | ICD-10-CM | POA: Diagnosis not present

## 2020-04-26 DIAGNOSIS — M6281 Muscle weakness (generalized): Secondary | ICD-10-CM | POA: Diagnosis not present

## 2020-04-26 DIAGNOSIS — R32 Unspecified urinary incontinence: Secondary | ICD-10-CM | POA: Diagnosis not present

## 2020-04-26 DIAGNOSIS — M25661 Stiffness of right knee, not elsewhere classified: Secondary | ICD-10-CM | POA: Diagnosis not present

## 2020-04-27 DIAGNOSIS — I5032 Chronic diastolic (congestive) heart failure: Secondary | ICD-10-CM | POA: Diagnosis not present

## 2020-04-27 DIAGNOSIS — Z9181 History of falling: Secondary | ICD-10-CM | POA: Diagnosis not present

## 2020-04-27 DIAGNOSIS — R29898 Other symptoms and signs involving the musculoskeletal system: Secondary | ICD-10-CM | POA: Diagnosis not present

## 2020-04-27 DIAGNOSIS — M150 Primary generalized (osteo)arthritis: Secondary | ICD-10-CM | POA: Diagnosis not present

## 2020-04-27 DIAGNOSIS — R32 Unspecified urinary incontinence: Secondary | ICD-10-CM | POA: Diagnosis not present

## 2020-04-27 DIAGNOSIS — R2681 Unsteadiness on feet: Secondary | ICD-10-CM | POA: Diagnosis not present

## 2020-04-27 DIAGNOSIS — M6281 Muscle weakness (generalized): Secondary | ICD-10-CM | POA: Diagnosis not present

## 2020-04-27 DIAGNOSIS — M25662 Stiffness of left knee, not elsewhere classified: Secondary | ICD-10-CM | POA: Diagnosis not present

## 2020-04-27 DIAGNOSIS — M25661 Stiffness of right knee, not elsewhere classified: Secondary | ICD-10-CM | POA: Diagnosis not present

## 2020-04-27 DIAGNOSIS — N3946 Mixed incontinence: Secondary | ICD-10-CM | POA: Diagnosis not present

## 2020-04-30 DIAGNOSIS — M150 Primary generalized (osteo)arthritis: Secondary | ICD-10-CM | POA: Diagnosis not present

## 2020-04-30 DIAGNOSIS — N3946 Mixed incontinence: Secondary | ICD-10-CM | POA: Diagnosis not present

## 2020-04-30 DIAGNOSIS — M25662 Stiffness of left knee, not elsewhere classified: Secondary | ICD-10-CM | POA: Diagnosis not present

## 2020-04-30 DIAGNOSIS — M25661 Stiffness of right knee, not elsewhere classified: Secondary | ICD-10-CM | POA: Diagnosis not present

## 2020-04-30 DIAGNOSIS — M6281 Muscle weakness (generalized): Secondary | ICD-10-CM | POA: Diagnosis not present

## 2020-04-30 DIAGNOSIS — R29898 Other symptoms and signs involving the musculoskeletal system: Secondary | ICD-10-CM | POA: Diagnosis not present

## 2020-04-30 DIAGNOSIS — R2681 Unsteadiness on feet: Secondary | ICD-10-CM | POA: Diagnosis not present

## 2020-04-30 DIAGNOSIS — R32 Unspecified urinary incontinence: Secondary | ICD-10-CM | POA: Diagnosis not present

## 2020-04-30 DIAGNOSIS — Z9181 History of falling: Secondary | ICD-10-CM | POA: Diagnosis not present

## 2020-04-30 DIAGNOSIS — I5032 Chronic diastolic (congestive) heart failure: Secondary | ICD-10-CM | POA: Diagnosis not present

## 2020-05-01 DIAGNOSIS — R29898 Other symptoms and signs involving the musculoskeletal system: Secondary | ICD-10-CM | POA: Diagnosis not present

## 2020-05-01 DIAGNOSIS — M6281 Muscle weakness (generalized): Secondary | ICD-10-CM | POA: Diagnosis not present

## 2020-05-01 DIAGNOSIS — N3946 Mixed incontinence: Secondary | ICD-10-CM | POA: Diagnosis not present

## 2020-05-01 DIAGNOSIS — Z9181 History of falling: Secondary | ICD-10-CM | POA: Diagnosis not present

## 2020-05-01 DIAGNOSIS — M25662 Stiffness of left knee, not elsewhere classified: Secondary | ICD-10-CM | POA: Diagnosis not present

## 2020-05-01 DIAGNOSIS — M25661 Stiffness of right knee, not elsewhere classified: Secondary | ICD-10-CM | POA: Diagnosis not present

## 2020-05-01 DIAGNOSIS — M150 Primary generalized (osteo)arthritis: Secondary | ICD-10-CM | POA: Diagnosis not present

## 2020-05-01 DIAGNOSIS — R32 Unspecified urinary incontinence: Secondary | ICD-10-CM | POA: Diagnosis not present

## 2020-05-01 DIAGNOSIS — R2681 Unsteadiness on feet: Secondary | ICD-10-CM | POA: Diagnosis not present

## 2020-05-01 DIAGNOSIS — I5032 Chronic diastolic (congestive) heart failure: Secondary | ICD-10-CM | POA: Diagnosis not present

## 2020-05-02 DIAGNOSIS — M6281 Muscle weakness (generalized): Secondary | ICD-10-CM | POA: Diagnosis not present

## 2020-05-02 DIAGNOSIS — R32 Unspecified urinary incontinence: Secondary | ICD-10-CM | POA: Diagnosis not present

## 2020-05-02 DIAGNOSIS — Z9181 History of falling: Secondary | ICD-10-CM | POA: Diagnosis not present

## 2020-05-02 DIAGNOSIS — R2681 Unsteadiness on feet: Secondary | ICD-10-CM | POA: Diagnosis not present

## 2020-05-02 DIAGNOSIS — R29898 Other symptoms and signs involving the musculoskeletal system: Secondary | ICD-10-CM | POA: Diagnosis not present

## 2020-05-02 DIAGNOSIS — N3946 Mixed incontinence: Secondary | ICD-10-CM | POA: Diagnosis not present

## 2020-05-02 DIAGNOSIS — M25662 Stiffness of left knee, not elsewhere classified: Secondary | ICD-10-CM | POA: Diagnosis not present

## 2020-05-02 DIAGNOSIS — M25661 Stiffness of right knee, not elsewhere classified: Secondary | ICD-10-CM | POA: Diagnosis not present

## 2020-05-02 DIAGNOSIS — M150 Primary generalized (osteo)arthritis: Secondary | ICD-10-CM | POA: Diagnosis not present

## 2020-05-02 DIAGNOSIS — I5032 Chronic diastolic (congestive) heart failure: Secondary | ICD-10-CM | POA: Diagnosis not present

## 2020-05-03 DIAGNOSIS — I5032 Chronic diastolic (congestive) heart failure: Secondary | ICD-10-CM | POA: Diagnosis not present

## 2020-05-03 DIAGNOSIS — M25662 Stiffness of left knee, not elsewhere classified: Secondary | ICD-10-CM | POA: Diagnosis not present

## 2020-05-03 DIAGNOSIS — M150 Primary generalized (osteo)arthritis: Secondary | ICD-10-CM | POA: Diagnosis not present

## 2020-05-03 DIAGNOSIS — N3946 Mixed incontinence: Secondary | ICD-10-CM | POA: Diagnosis not present

## 2020-05-03 DIAGNOSIS — M25661 Stiffness of right knee, not elsewhere classified: Secondary | ICD-10-CM | POA: Diagnosis not present

## 2020-05-03 DIAGNOSIS — R32 Unspecified urinary incontinence: Secondary | ICD-10-CM | POA: Diagnosis not present

## 2020-05-03 DIAGNOSIS — R2681 Unsteadiness on feet: Secondary | ICD-10-CM | POA: Diagnosis not present

## 2020-05-03 DIAGNOSIS — Z9181 History of falling: Secondary | ICD-10-CM | POA: Diagnosis not present

## 2020-05-03 DIAGNOSIS — R29898 Other symptoms and signs involving the musculoskeletal system: Secondary | ICD-10-CM | POA: Diagnosis not present

## 2020-05-03 DIAGNOSIS — M6281 Muscle weakness (generalized): Secondary | ICD-10-CM | POA: Diagnosis not present

## 2020-05-04 DIAGNOSIS — I5032 Chronic diastolic (congestive) heart failure: Secondary | ICD-10-CM | POA: Diagnosis not present

## 2020-05-04 DIAGNOSIS — M6281 Muscle weakness (generalized): Secondary | ICD-10-CM | POA: Diagnosis not present

## 2020-05-04 DIAGNOSIS — R2681 Unsteadiness on feet: Secondary | ICD-10-CM | POA: Diagnosis not present

## 2020-05-04 DIAGNOSIS — Z9181 History of falling: Secondary | ICD-10-CM | POA: Diagnosis not present

## 2020-05-04 DIAGNOSIS — M150 Primary generalized (osteo)arthritis: Secondary | ICD-10-CM | POA: Diagnosis not present

## 2020-05-04 DIAGNOSIS — R32 Unspecified urinary incontinence: Secondary | ICD-10-CM | POA: Diagnosis not present

## 2020-05-04 DIAGNOSIS — R29898 Other symptoms and signs involving the musculoskeletal system: Secondary | ICD-10-CM | POA: Diagnosis not present

## 2020-05-04 DIAGNOSIS — M25661 Stiffness of right knee, not elsewhere classified: Secondary | ICD-10-CM | POA: Diagnosis not present

## 2020-05-04 DIAGNOSIS — N3946 Mixed incontinence: Secondary | ICD-10-CM | POA: Diagnosis not present

## 2020-05-04 DIAGNOSIS — M25662 Stiffness of left knee, not elsewhere classified: Secondary | ICD-10-CM | POA: Diagnosis not present

## 2020-05-07 DIAGNOSIS — I5032 Chronic diastolic (congestive) heart failure: Secondary | ICD-10-CM | POA: Diagnosis not present

## 2020-05-07 DIAGNOSIS — R32 Unspecified urinary incontinence: Secondary | ICD-10-CM | POA: Diagnosis not present

## 2020-05-07 DIAGNOSIS — R29898 Other symptoms and signs involving the musculoskeletal system: Secondary | ICD-10-CM | POA: Diagnosis not present

## 2020-05-07 DIAGNOSIS — M150 Primary generalized (osteo)arthritis: Secondary | ICD-10-CM | POA: Diagnosis not present

## 2020-05-07 DIAGNOSIS — M25661 Stiffness of right knee, not elsewhere classified: Secondary | ICD-10-CM | POA: Diagnosis not present

## 2020-05-07 DIAGNOSIS — M6281 Muscle weakness (generalized): Secondary | ICD-10-CM | POA: Diagnosis not present

## 2020-05-07 DIAGNOSIS — N3946 Mixed incontinence: Secondary | ICD-10-CM | POA: Diagnosis not present

## 2020-05-07 DIAGNOSIS — Z9181 History of falling: Secondary | ICD-10-CM | POA: Diagnosis not present

## 2020-05-07 DIAGNOSIS — R2681 Unsteadiness on feet: Secondary | ICD-10-CM | POA: Diagnosis not present

## 2020-05-07 DIAGNOSIS — M25662 Stiffness of left knee, not elsewhere classified: Secondary | ICD-10-CM | POA: Diagnosis not present

## 2020-05-08 DIAGNOSIS — N3946 Mixed incontinence: Secondary | ICD-10-CM | POA: Diagnosis not present

## 2020-05-08 DIAGNOSIS — M6281 Muscle weakness (generalized): Secondary | ICD-10-CM | POA: Diagnosis not present

## 2020-05-08 DIAGNOSIS — R32 Unspecified urinary incontinence: Secondary | ICD-10-CM | POA: Diagnosis not present

## 2020-05-08 DIAGNOSIS — R29898 Other symptoms and signs involving the musculoskeletal system: Secondary | ICD-10-CM | POA: Diagnosis not present

## 2020-05-08 DIAGNOSIS — Z9181 History of falling: Secondary | ICD-10-CM | POA: Diagnosis not present

## 2020-05-08 DIAGNOSIS — M25661 Stiffness of right knee, not elsewhere classified: Secondary | ICD-10-CM | POA: Diagnosis not present

## 2020-05-08 DIAGNOSIS — I5032 Chronic diastolic (congestive) heart failure: Secondary | ICD-10-CM | POA: Diagnosis not present

## 2020-05-08 DIAGNOSIS — R2681 Unsteadiness on feet: Secondary | ICD-10-CM | POA: Diagnosis not present

## 2020-05-08 DIAGNOSIS — M150 Primary generalized (osteo)arthritis: Secondary | ICD-10-CM | POA: Diagnosis not present

## 2020-05-08 DIAGNOSIS — M25662 Stiffness of left knee, not elsewhere classified: Secondary | ICD-10-CM | POA: Diagnosis not present

## 2020-05-09 ENCOUNTER — Encounter: Payer: Self-pay | Admitting: Nurse Practitioner

## 2020-05-09 ENCOUNTER — Other Ambulatory Visit: Payer: Self-pay

## 2020-05-09 ENCOUNTER — Ambulatory Visit (INDEPENDENT_AMBULATORY_CARE_PROVIDER_SITE_OTHER): Payer: Medicare HMO | Admitting: Adult Health

## 2020-05-09 ENCOUNTER — Encounter: Payer: Self-pay | Admitting: Adult Health

## 2020-05-09 ENCOUNTER — Non-Acute Institutional Stay (SKILLED_NURSING_FACILITY): Payer: Medicare HMO | Admitting: Nurse Practitioner

## 2020-05-09 VITALS — BP 122/84 | HR 70 | Ht 67.0 in | Wt 167.0 lb

## 2020-05-09 DIAGNOSIS — N3946 Mixed incontinence: Secondary | ICD-10-CM | POA: Diagnosis not present

## 2020-05-09 DIAGNOSIS — K219 Gastro-esophageal reflux disease without esophagitis: Secondary | ICD-10-CM | POA: Diagnosis not present

## 2020-05-09 DIAGNOSIS — I5032 Chronic diastolic (congestive) heart failure: Secondary | ICD-10-CM | POA: Diagnosis not present

## 2020-05-09 DIAGNOSIS — I2510 Atherosclerotic heart disease of native coronary artery without angina pectoris: Secondary | ICD-10-CM

## 2020-05-09 DIAGNOSIS — M5441 Lumbago with sciatica, right side: Secondary | ICD-10-CM | POA: Diagnosis not present

## 2020-05-09 DIAGNOSIS — M150 Primary generalized (osteo)arthritis: Secondary | ICD-10-CM

## 2020-05-09 DIAGNOSIS — R32 Unspecified urinary incontinence: Secondary | ICD-10-CM | POA: Diagnosis not present

## 2020-05-09 DIAGNOSIS — G40209 Localization-related (focal) (partial) symptomatic epilepsy and epileptic syndromes with complex partial seizures, not intractable, without status epilepticus: Secondary | ICD-10-CM | POA: Diagnosis not present

## 2020-05-09 DIAGNOSIS — R569 Unspecified convulsions: Secondary | ICD-10-CM

## 2020-05-09 DIAGNOSIS — I4891 Unspecified atrial fibrillation: Secondary | ICD-10-CM

## 2020-05-09 DIAGNOSIS — I2583 Coronary atherosclerosis due to lipid rich plaque: Secondary | ICD-10-CM

## 2020-05-09 DIAGNOSIS — M8949 Other hypertrophic osteoarthropathy, multiple sites: Secondary | ICD-10-CM | POA: Diagnosis not present

## 2020-05-09 DIAGNOSIS — R69 Illness, unspecified: Secondary | ICD-10-CM | POA: Diagnosis not present

## 2020-05-09 DIAGNOSIS — I872 Venous insufficiency (chronic) (peripheral): Secondary | ICD-10-CM | POA: Diagnosis not present

## 2020-05-09 DIAGNOSIS — R29898 Other symptoms and signs involving the musculoskeletal system: Secondary | ICD-10-CM | POA: Diagnosis not present

## 2020-05-09 DIAGNOSIS — M25662 Stiffness of left knee, not elsewhere classified: Secondary | ICD-10-CM | POA: Diagnosis not present

## 2020-05-09 DIAGNOSIS — I10 Essential (primary) hypertension: Secondary | ICD-10-CM

## 2020-05-09 DIAGNOSIS — M159 Polyosteoarthritis, unspecified: Secondary | ICD-10-CM | POA: Insufficient documentation

## 2020-05-09 DIAGNOSIS — F5101 Primary insomnia: Secondary | ICD-10-CM

## 2020-05-09 DIAGNOSIS — Z9181 History of falling: Secondary | ICD-10-CM | POA: Diagnosis not present

## 2020-05-09 DIAGNOSIS — R2681 Unsteadiness on feet: Secondary | ICD-10-CM | POA: Diagnosis not present

## 2020-05-09 DIAGNOSIS — M6281 Muscle weakness (generalized): Secondary | ICD-10-CM | POA: Diagnosis not present

## 2020-05-09 DIAGNOSIS — M25661 Stiffness of right knee, not elsewhere classified: Secondary | ICD-10-CM | POA: Diagnosis not present

## 2020-05-09 NOTE — Progress Notes (Signed)
Location:   [facility] Room Number: 52 Place of Service:  SNF (31) Provider: Marlana Latus NP    Patient Care Team: [name], MD as PCP - General (Internal Medicine) Gatha Mayer, MD as Consulting Physician (Gastroenterology) [name], [name], MD as Consulting Physician (Cardiology) [name], MD as Attending Physician (Internal Medicine) Amedio Bowlby X, NP as Nurse Practitioner (Internal Medicine)  Extended Emergency Contact Information Primary Emergency Contact: Graeff,[name] Address: [place], [place] [address] [name] of [place] Phone: [phone] Mobile Phone: [phone] Relation: Daughter Secondary Emergency Contact: Dodgen,[name] Address: [place] [address] [name] of [place] Phone: [phone] Mobile Phone: [phone] Relation: Son  Code Status:  DNR Goals of care: Advanced Directive information Advanced Directives 05/09/2020  Does Patient Have a Medical Advance Directive? Yes  Type of Paramedic of [place];Living will;Out of facility DNR (pink MOST or yellow form)  Does patient want to make changes to medical advance directive? No - Guardian declined  Copy of [place] in Chart? Yes - validated most recent copy scanned in chart (See row information)  Pre-existing out of facility DNR order (yellow form or pink MOST form) -     Chief Complaint  Patient presents with   Medical Management of Chronic Issues    Routine Visit.    HPI:  Pt is a 86 y.o. male seen today for medical management of chronic diseases.      PVD, BLE brownish pigmentation, mild erythematous shins.              Afib, onset 09/2019, heart rate is conrolled, not a candidate for anticoagulation,  CHF, EF 45-80%, grade 1 diastolic dysfunction, off  Furosemide 40mg  qd, saw Cardiology 02/06/20. Bun/creat 15/1.0  04/06/20 HTN, blood pressure is controled on amlodipine 5mg  qd, Lisinopril 40mg  qd.  Hx of CAD, hyperlipidemia, on Crestor, refused ASA. LDL 41 04/06/20 GERD, takes Omeprazole 20mg  qd.  Insomnia, stable, on Trazodone Chronic lower back pain/right hip pain, on Gabapentin 200mg  qd OA, s/p left hip surgery, chronic right hip pain, w/c helps, prn Tylenol, Methocarbamol 250mg  qam/daily prn Seizure: takes Keppra 500mg  bid   Past Medical History:  Diagnosis Date   Allergic rhinitis    Amnesia    Atherosclerotic heart disease of native coronary artery without angina pectoris    BPH (benign prostatic hyperplasia)    Bradycardia    Chronic diastolic (congestive) heart failure (HCC)    Colon polyps    Coronary artery disease    MODERATE   Dizziness    Edema of lower extremity    Erectile dysfunction    H. pylori infection    Hx of    Hard of hearing    Headache(784.0)    History of colon polyps 2009   Dr. Levester Fresh -small cecal adenoma   History of falling    Hx of colonoscopy 2009   Dr Lajoyce Corners, hemorrhoids & polyps   Hyperlipidemia    Hypertension    Hypertensive heart disease with heart failure ([place])    Hypokalemia    Internal hemorrhoids 2009   Dr. Lajoyce Corners Colonoscopy   Metabolic encephalopathy    Mitral valve regurgitation    Muscle weakness (generalized)    Pneumonia    Primary generalized (osteo)arthritis    Reflux esophagitis    Sepsis ([place]) 11/04/2018  Unspecified hemorrhoids    Unsteadiness on feet    Vitamin D deficiency    Past Surgical History:  Procedure Laterality Date   APPENDECTOMY     CARDIAC CATHETERIZATION  1997   REVEALED MILD TO MODERATE IRREGULARITIES. HIS LEFT EVNTRICULAR SYSTOLIC FUNCTION REVEALED NORMAL EJECTION FRACTION WITH EF OF 70%   CATARACT EXTRACTION, BILATERAL     COLONOSCOPY  multiple   fatty tumor removal      benign   HEMORROIDECTOMY     HIP ARTHROPLASTY Left 08/17/2013   Procedure: ARTHROPLASTY BIPOLAR HIP;  Surgeon: Gearlean Alf, MD;  Location: [facility];  Service: Orthopedics;  Laterality: Left;   INGUINAL HERNIA REPAIR Bilateral 2006   lower back surgery  2006   NASAL SEPTUM SURGERY     TONSILLECTOMY     UPPER GASTROINTESTINAL ENDOSCOPY      No Known Allergies  Allergies as of 05/09/2020   No Known Allergies     Medication List       Accurate as of May 09, 2020 11:59 PM. If you have any questions, ask your nurse or doctor.        acetaminophen 500 MG tablet Commonly known as: TYLENOL Take 500 mg by mouth as needed. What changed: Another medication with the same name was removed. Continue taking this medication, and follow the directions you see here. Changed by: Marva Hendryx X Melannie Metzner, NP   amLODipine 5 MG tablet Commonly known as: NORVASC Take 5 mg by mouth daily.   Anusol-HC 2.5 % rectal cream Generic drug: hydrocortisone Place 1 application rectally daily as needed for hemorrhoids or anal itching (USE PERINEAL APPLICATOR). With perineal applicator   aspirin 81 MG chewable tablet Chew 81 mg by mouth daily.   Carboxymethylcellulose Sod PF 0.5 % Soln Place 1 drop into both eyes daily as needed (dry eyes).   [place] Nasal Spray 0.65 % nasal spray Generic drug: sodium chloride Place 1 spray into the nose daily as needed for congestion (BOTH NOSTRILS).   gabapentin 100 MG capsule Commonly known as: NEURONTIN Take 200 mg by mouth at bedtime.   levETIRAcetam 500 MG tablet Commonly known as: KEPPRA Take 1 tablet (500 mg total) by mouth 2 (two) times daily.   lisinopril 40 MG tablet Commonly known as: ZESTRIL Take 40 mg by mouth daily.   omeprazole 20 MG capsule Commonly known as: PRILOSEC Take 20 mg by mouth daily.   Robaxin 500 MG tablet Generic drug: methocarbamol Take 250 mg by mouth as needed. Take 250 mg by mouth in the morning and an additional 250 mg  once a day as needed for muscle spasms   rosuvastatin 5 MG tablet Commonly known as: CRESTOR Take 5 mg by mouth daily.   Thera-M Tabs Take 1 tablet by mouth daily.   traZODone 50 MG tablet Commonly known as: DESYREL Take 0.5 tablets (25 mg total) by mouth at bedtime.   vitamin B-12 1000 MCG tablet Commonly known as: CYANOCOBALAMIN Take 1,000 mcg by mouth daily.       Review of Systems  Constitutional: Negative for activity change, appetite change and fever.  HENT: Positive for hearing loss. Negative for congestion and voice change.   Eyes: Negative for visual disturbance.  Respiratory: Negative for cough and shortness of breath.   Cardiovascular: Positive for leg swelling.  Gastrointestinal: Negative for abdominal pain and constipation.  Genitourinary: Negative for difficulty urinating, dysuria and urgency.  Musculoskeletal: Positive for arthralgias, back pain and gait problem.  Lower back, right hip/thigh pain   Skin: Negative for color change.  Neurological: Negative for dizziness, seizures, speech difficulty, weakness and headaches.       Memory lapses. Tingling in toes.   Psychiatric/Behavioral: Negative for behavioral problems and sleep disturbance. The patient is not nervous/anxious.     Immunization History  Administered Date(s) Administered   Influenza, High Dose Seasonal PF 03/19/2019, 03/19/2019   Influenza-Unspecified 04/08/2016, 03/27/2020   Moderna SARS-COVID-2 Vaccination 06/18/2019, 07/16/2019   Pneumococcal Conjugate-13 06/02/2013   Pneumococcal Polysaccharide-23 04/07/2001   Pertinent  Health Maintenance Due  Topic Date Due   INFLUENZA VACCINE  Completed   PNA vac Low Risk Adult  Completed   Fall Risk  11/03/2017 07/11/2016  Falls in the past year? Yes Yes  Number falls in past yr: 2 or more 2 or more  Injury with Fall? Yes Yes  Comment - broke toe on right foot  Risk Factor Category  High Fall Risk -  Follow up Education  provided;Falls prevention discussed -   Functional Status Survey:    Vitals:   05/09/20 1415  BP: 120/74  Pulse: 78  Resp: 18  Temp: (!) 96.8 F (36 C)  SpO2: 98%  Weight: 164 lb (74.4 kg)  Height: 5\' 7"  (1.702 m)   Body mass index is 25.69 kg/m. Physical Exam Vitals and nursing note reviewed.  Constitutional:      Appearance: Normal appearance.  HENT:     Head: Normocephalic and atraumatic.     Mouth/Throat:     Mouth: Mucous membranes are moist.  Eyes:     Conjunctiva/sclera: Conjunctivae normal.     Pupils: Pupils are equal, round, and reactive to light.  Cardiovascular:     Rate and Rhythm: Normal rate and regular rhythm.     Heart sounds: Murmur heard.   Pulmonary:     Breath sounds: No rales.  Abdominal:     General: Bowel sounds are normal.     Palpations: Abdomen is soft.     Tenderness: There is no abdominal tenderness.  Genitourinary:    Comments: External hemorrhoids, no bleeding or injury.  Musculoskeletal:     Cervical back: Normal range of motion and neck supple. No muscular tenderness.     Right lower leg: Edema present.     Left lower leg: Edema present.     Comments: Trace  edema BLE. W/c for mobility.   Skin:    General: Skin is warm and dry.     Comments: Chronic venous insufficiency skin changes BLE  Neurological:     General: No focal deficit present.     Mental Status: He is alert. Mental status is at baseline.     Gait: Gait abnormal.     Comments: Oriented to person, place.   Psychiatric:        Mood and Affect: Mood normal.        Behavior: Behavior normal.        Thought Content: Thought content normal.     Labs reviewed: Recent Labs    09/22/19 0333 09/22/19 0333 09/23/19 0619 09/23/19 0619 09/27/19 0000 09/27/19 0000 01/03/20 0000 01/12/20 0257 04/06/20 0000  NA 135   < > 137  --  136*   < > 134* 134* 137  K 3.9   < > 4.3   < > 4.1   < > 4.1 3.7 4.0  CL 101   < > 101   < > 101   < > 100 95*  104  CO2 23   < > 25    < > 29*  --  27*  --  25*  GLUCOSE 97  --  107*  --   --   --   --  131*  --   BUN 11   < > 18  --  15   < > 13 13 15   CREATININE 0.92   < > 1.03  --  0.9   < > 0.9 0.90 1.0  CALCIUM 8.8*   < > 9.0   < > 8.9  --  2.9*  --  8.6*   < > = values in this interval not displayed.   Recent Labs    09/18/19 2256 09/27/19 0000 04/06/20 0000  AST 39 20 16  ALT 27 23 16   ALKPHOS 71 60 66  BILITOT 0.8  --   --   PROT 6.3*  --   --   ALBUMIN 3.8 3.4* 3.4*   Recent Labs    09/22/19 0333 09/22/19 0333 09/23/19 0857 09/23/19 0857 09/27/19 0000 09/27/19 0000 01/03/20 0000 01/12/20 0250 01/12/20 0257  WBC 9.5   < > 8.8  --  10.8  --  6.1 11.6*  --   NEUTROABS 5.9   < > 5.8  --  7,096  --  3,440.00 10.0*  --   HGB 14.1   < > 15.5   < > 13.4*   < > 14.0 14.1 13.9  HCT 41.8   < > 46.2   < > 39*   < > 40* 40.7 41.0  MCV 95.9  --  96.7  --   --   --   --  93.3  --   PLT 208   < > 260   < > 230  --  188 174  --    < > = values in this interval not displayed.   Lab Results  Component Value Date   TSH 1.77 04/06/2020   No results found for: HGBA1C Lab Results  Component Value Date   CHOL 94 04/06/2020   HDL 37 04/06/2020   LDLCALC 41 04/06/2020   TRIG 79 04/06/2020   CHOLHDL 2.7 02/17/2017    Significant Diagnostic Results in last 30 days:  No results found.  Assessment/Plan  Venous insufficiency (chronic) (peripheral) PVD, BLE brownish pigmentation, mild erythematous shins.   New onset atrial fibrillation (HCC) Afib, onset 09/2019, heart rate is conrolled, not a candidate for anticoagulation,    Chronic diastolic heart failure (HCC) CHF, EF 71-24%, grade 1 diastolic dysfunction, off  Furosemide 40mg  qd, saw Cardiology 02/06/20   Hypertension HTN, blood pressure is controled on amlodipine 5mg  qd, Lisinopril 40mg  qd.   CAD (coronary artery disease) Hx of CAD, hyperlipidemia, on Crestor, refused ASA   GERD (gastroesophageal reflux disease) GERD, takes Omeprazole 20mg  qd.     Insomnia Insomnia, stable, on Trazodone   Lower back pain Chronic lower back pain/right hip pain, on Gabapentin 200mg  qd  Osteoarthritis, multiple sites OA, s/p left hip surgery, chronic right hip pain, w/c helps, prn Tylenol, Methocarbamol 250mg  qam/daily prn   Seizure ([place]) On set 09/2019, EEG, Seizure: takes Keppra 500mg  bid    Family/ staff Communication: plan of care reviewed with the patient and charge nurse.   Labs/tests ordered: none  Time spend 35 minutes.

## 2020-05-09 NOTE — Progress Notes (Addendum)
PATIENT: [name] [name] DOB: 08/06/1924  REASON FOR VISIT: follow up HISTORY FROM: patient  HISTORY OF PRESENT ILLNESS: Today 05/09/20:  [name] [name] is a 86 year old male with a history of seizures.  He returns today for follow-up.  He is currently taking Keppra 500 mg twice a day.  He denies any seizure events.  He currently resides at a nursing facility. Reports that he has been doing well. Denies any new issues. Returns today for an evaluation.  HISTORY (copied from Dr. Cathren Laine note): 11/02/2019: Patient is referred back for seizure.  I reviewed notes from [facility] where he was admitted in early April of this year.  He was found down at his nursing home after having fallen from a chair, reason for fall is slightly unclear but he was confused after the fall, he was placed in bed and almost immediately thereafter had convulsive activity involving the right side only, lasted approximately 30 seconds and following this he was somnolent, unarousable with sonorous respirations, code stroke was called.  Earlier that day he was in his normal state and speaking normally however she noticed he had a minute long episode where he was staring off into space and smacking his lips, she tried to get his attention was able to, he abruptly stopped and looked at his watch and resume normal conversation.  Diagnosed with partial seizures and started on Keppra.  MRI of the brain showed vasogenic edema in the left temporal lobe correlating to the middle cranial fossa meningioma reported 2017.  However no acute hemorrhage or mass lesion.  Patient was also diagnosed with a urinary tract infection.  EEG completed on April 5 was suggestive of mild diffuse encephalopathy, nonspecific etiology, no seizures or epileptiform discharges were seen throughout the recording.  CTA of the head and neck was negative for large vessel occlusion.  Patient was also found to be in new onset A. Fib, white blood cell count  was 27 with large amounts of leukocytes and many bacteria in the UA, and acute kidney injury and active metabolic encephalopathy.    EEG: IMPRESSION: This study issuggestive of mild diffuse encephalopathy, nonspecific etiology. No seizures or epileptiform discharges were seen throughout the recording.   REVIEW OF SYSTEMS: Out of a complete 14 system review of symptoms, the patient complains only of the following symptoms, and all other reviewed systems are negative.  ALLERGIES: No Known Allergies  HOME MEDICATIONS: Outpatient Medications Prior to Visit  Medication Sig Dispense Refill  . acetaminophen (TYLENOL) 500 MG tablet Take 500 mg by mouth every 8 (eight) hours as needed for mild pain or headache.     Marland Kitchen amLODipine (NORVASC) 5 MG tablet Take 5 mg by mouth daily.    . Carboxymethylcellulose Sod PF 0.5 % SOLN Place 1 drop into both eyes daily as needed (dry eyes).     . gabapentin (NEURONTIN) 100 MG capsule Take 200 mg by mouth at bedtime.    . hydrocortisone (ANUSOL-HC) 2.5 % rectal cream Place 1 application rectally daily as needed for hemorrhoids or anal itching (USE PERINEAL APPLICATOR). With perineal applicator     . levETIRAcetam (KEPPRA) 500 MG tablet Take 1 tablet (500 mg total) by mouth 2 (two) times daily. 180 tablet 4  . lisinopril (ZESTRIL) 40 MG tablet Take 40 mg by mouth daily.    . methocarbamol (ROBAXIN) 500 MG tablet Take 250 mg by mouth See admin instructions. Take 250 mg by mouth in the morning and an additional 250 mg  once a day as needed for muscle spasms    . Multiple Vitamins-Minerals (THERA-M) TABS Take 1 tablet by mouth daily.    Marland Kitchen omeprazole (PRILOSEC) 20 MG capsule Take 20 mg by mouth daily.    . rosuvastatin (CRESTOR) 5 MG tablet Take 5 mg by mouth daily.     . sodium chloride (DEEP SEA NASAL SPRAY) 0.65 % nasal spray Place 1 spray into the nose daily as needed for congestion (BOTH NOSTRILS).     Marland Kitchen traZODone (DESYREL) 50 MG tablet Take 0.5 tablets (25 mg  total) by mouth at bedtime. 30 tablet 0  . vitamin B-12 (CYANOCOBALAMIN) 1000 MCG tablet Take 1,000 mcg by mouth daily.     No facility-administered medications prior to visit.    PAST MEDICAL HISTORY: Past Medical History:  Diagnosis Date  . Allergic rhinitis   . Amnesia   . Atherosclerotic heart disease of native coronary artery without angina pectoris   . BPH (benign prostatic hyperplasia)   . Bradycardia   . Chronic diastolic (congestive) heart failure ([place])   . Colon polyps   . Coronary artery disease    MODERATE  . Dizziness   . Edema of lower extremity   . Erectile dysfunction   . H. pylori infection    Hx of   . Hard of hearing   . Headache(784.0)   . History of colon polyps 2009   Dr. Orr/Colonoscopy -small cecal adenoma  . History of falling   . Hx of colonoscopy 2009   Dr Lajoyce Corners, hemorrhoids & polyps  . Hyperlipidemia   . Hypertension   . Hypertensive heart disease with heart failure ([place])   . Hypokalemia   . Internal hemorrhoids 2009   Dr. Lajoyce Corners Colonoscopy  . Metabolic encephalopathy   . Mitral valve regurgitation   . Muscle weakness (generalized)   . Pneumonia   . Primary generalized (osteo)arthritis   . Reflux esophagitis   . Sepsis ([place]) 11/04/2018  . Unspecified hemorrhoids   . Unsteadiness on feet   . Vitamin D deficiency     PAST SURGICAL HISTORY: Past Surgical History:  Procedure Laterality Date  . APPENDECTOMY    . CARDIAC CATHETERIZATION  1997   REVEALED MILD TO MODERATE IRREGULARITIES. HIS LEFT EVNTRICULAR SYSTOLIC FUNCTION REVEALED NORMAL EJECTION FRACTION WITH EF OF 70%  . CATARACT EXTRACTION, BILATERAL    . COLONOSCOPY  multiple  . fatty tumor removal     benign  . HEMORROIDECTOMY    . HIP ARTHROPLASTY Left 08/17/2013   Procedure: ARTHROPLASTY BIPOLAR HIP;  Surgeon: Gearlean Alf, MD;  Location: [facility];  Service: Orthopedics;  Laterality: Left;  . INGUINAL HERNIA REPAIR Bilateral 2006  . lower back surgery  2006  . NASAL SEPTUM  SURGERY    . TONSILLECTOMY    . UPPER GASTROINTESTINAL ENDOSCOPY      FAMILY HISTORY: Family History  Problem Relation Age of Onset  . Colon cancer Mother 79       second cancer in 46's deceased after emergency surgery  . Breast cancer Sister   . Seizures Neg Hx     SOCIAL HISTORY: Social History   Socioeconomic History  . Marital status: Widowed    Spouse name: Not on file  . Number of children: 3  . Years of education: Not on file  . Highest education level: Not on file  Occupational History  . Not on file  Tobacco Use  . Smoking status: Former Smoker    Quit date: 10/25/1975  Years since quitting: 44.5  . Smokeless tobacco: Never Used  Vaping Use  . Vaping Use: Never used  Substance and Sexual Activity  . Alcohol use: Yes    Comment: occ 6-7 times a year  . Drug use: No  . Sexual activity: Not on file  Other Topics Concern  . Not on file  Social History Narrative   Retired and widowed   Lives at [facility]   Right handed   Social Determinants of Health   Financial Resource Strain:   . Difficulty of Paying Living Expenses: Not on file  Food Insecurity:   . Worried About Charity fundraiser in the Last Year: Not on file  . Ran Out of Food in the Last Year: Not on file  Transportation Needs:   . Lack of Transportation (Medical): Not on file  . Lack of Transportation (Non-Medical): Not on file  Physical Activity:   . Days of Exercise per Week: Not on file  . Minutes of Exercise per Session: Not on file  Stress:   . Feeling of Stress : Not on file  Social Connections:   . Frequency of Communication with Friends and Family: Not on file  . Frequency of Social Gatherings with Friends and Family: Not on file  . Attends Religious Services: Not on file  . Active Member of Clubs or Organizations: Not on file  . Attends Archivist Meetings: Not on file  . Marital Status: Not on file  Intimate Partner Violence:   . Fear of Current or  Ex-Partner: Not on file  . Emotionally Abused: Not on file  . Physically Abused: Not on file  . Sexually Abused: Not on file      PHYSICAL EXAM  Vitals:   05/09/20 1317  BP: 122/84  Pulse: 70  Weight: 167 lb (75.8 kg)  Height: 5\' 7"  (1.702 m)   Body mass index is 26.16 kg/m.  Generalized: Well developed, in no acute distress   Neurological examination  Mentation: Alert oriented to time, place, history taking. Follows all commands speech and language fluent Cranial nerve II-XII: Pupils were equal round reactive to light. Extraocular movements were full, visual field were full on confrontational test. Head turning and shoulder shrug  were normal and symmetric. Motor: The motor testing reveals 5 over 5 strength of all 4 extremities. Good symmetric motor tone is noted throughout.  Sensory: Sensory testing is intact to soft touch on all 4 extremities. No evidence of extinction is noted.  Coordination: Cerebellar testing reveals good finger-nose-finger and heel-to-shin bilaterally.  Gait and station: In a wheelchair Reflexes: Deep tendon reflexes are symmetric and normal bilaterally.   DIAGNOSTIC DATA (LABS, IMAGING, TESTING) - I reviewed patient records, labs, notes, testing and imaging myself where available.  Lab Results  Component Value Date   WBC 11.6 (H) 01/12/2020   HGB 13.9 01/12/2020   HCT 41.0 01/12/2020   MCV 93.3 01/12/2020   PLT 174 01/12/2020      Component Value Date/Time   NA 137 04/06/2020 0000   K 4.0 04/06/2020 0000   CL 104 04/06/2020 0000   CL 103 03/01/2019 0000   CO2 25 (A) 04/06/2020 0000   CO2 30 03/01/2019 0000   GLUCOSE 131 (H) 01/12/2020 0257   BUN 15 04/06/2020 0000   CREATININE 1.0 04/06/2020 0000   CREATININE 0.90 01/12/2020 0257   CREATININE 1.02 10/30/2015 0737   CALCIUM 8.6 (A) 04/06/2020 0000   CALCIUM 9.5 03/01/2019 0000  PROT 6.3 (L) 09/18/2019 2256   PROT 6.2 03/01/2019 0000   ALBUMIN 3.4 (A) 04/06/2020 0000   ALBUMIN 3.9  03/01/2019 0000   AST 16 04/06/2020 0000   ALT 16 04/06/2020 0000   ALKPHOS 66 04/06/2020 0000   BILITOT 0.8 09/18/2019 2256   BILITOT 1.0 02/17/2017 0820   GFRNONAA >60 09/23/2019 0619   GFRNONAA 72 03/01/2019 0000   GFRAA >60 09/23/2019 0619   Lab Results  Component Value Date   CHOL 94 04/06/2020   HDL 37 04/06/2020   LDLCALC 41 04/06/2020   TRIG 79 04/06/2020   CHOLHDL 2.7 02/17/2017   No results found for: HGBA1C Lab Results  Component Value Date   VITAMINB12 1,261 (H) 11/01/2018   Lab Results  Component Value Date   TSH 1.77 04/06/2020      ASSESSMENT AND PLAN 86 y.o. year old male  has a past medical history of Allergic rhinitis, Amnesia, Atherosclerotic heart disease of native coronary artery without angina pectoris, BPH (benign prostatic hyperplasia), Bradycardia, Chronic diastolic (congestive) heart failure ([place]), Colon polyps, Coronary artery disease, Dizziness, Edema of lower extremity, Erectile dysfunction, H. pylori infection, Hard of hearing, Headache(784.0), History of colon polyps (2009), History of falling, colonoscopy (2009), Hyperlipidemia, Hypertension, Hypertensive heart disease with heart failure ([place]), Hypokalemia, Internal hemorrhoids (5189), Metabolic encephalopathy, Mitral valve regurgitation, Muscle weakness (generalized), Pneumonia, Primary generalized (osteo)arthritis, Reflux esophagitis, Sepsis ([place]) (11/04/2018), Unspecified hemorrhoids, Unsteadiness on feet, and Vitamin D deficiency. here with:  1.  Seizures  --Continue Keppra 500 mg twice a day --Advised if he has any seizure events he should let us know --Follow-up in 6 months or sooner if needed  I spent 25 minutes of face-to-face and non-face-to-face time with patient.  This included previsit chart review, lab review, study review, order entry, electronic health record documentation, patient education.  Ward Givens, MSN, NP-C 05/09/2020, 1:37 PM [facility] [address] [place], [place] [address] [phone]  Made any corrections needed, and agree with history, physical, neuro exam,assessment and plan as stated.     Sarina Ill, MD [facility]

## 2020-05-09 NOTE — Assessment & Plan Note (Signed)
Insomnia, stable, on Trazodone

## 2020-05-09 NOTE — Assessment & Plan Note (Signed)
CHF, EF 67-70%, grade 1 diastolic dysfunction, off  Furosemide 40mg  qd, saw Cardiology 02/06/20

## 2020-05-09 NOTE — Assessment & Plan Note (Signed)
On set 09/2019, EEG, Seizure: takes Keppra 500mg  bid

## 2020-05-09 NOTE — Assessment & Plan Note (Signed)
Chronic lower back pain/right hip pain,  on Gabapentin 200mg qd 

## 2020-05-09 NOTE — Assessment & Plan Note (Signed)
GERD, takes Omeprazole 20mg  qd.

## 2020-05-09 NOTE — Assessment & Plan Note (Signed)
Hx of CAD, hyperlipidemia, on Crestor, refused ASA

## 2020-05-09 NOTE — Assessment & Plan Note (Signed)
HTN, blood pressure is controled on amlodipine 5mg  qd, Lisinopril 40mg  qd.

## 2020-05-09 NOTE — Assessment & Plan Note (Signed)
OA, s/p left hip surgery, chronic right hip pain, w/c helps, prn Tylenol, Methocarbamol 250mg  qam/daily prn

## 2020-05-09 NOTE — Patient Instructions (Signed)
Your Plan: ° °Continue Keppra 500 mg twice a day  °If your symptoms worsen or you develop new symptoms please let us know.  ° ° °Thank you for coming to see us at [facility]. I hope we have been able to provide you high quality care today. ° °You may receive a patient satisfaction survey over the next few weeks. We would appreciate your feedback and comments so that we may continue to improve ourselves and the health of our patients. ° °

## 2020-05-09 NOTE — Assessment & Plan Note (Signed)
PVD, BLE brownish pigmentation,mild erythematous shins.  

## 2020-05-09 NOTE — Assessment & Plan Note (Signed)
Afib, onset 09/2019, heart rate is conrolled, not a candidate for anticoagulation,

## 2020-05-11 DIAGNOSIS — R29898 Other symptoms and signs involving the musculoskeletal system: Secondary | ICD-10-CM | POA: Diagnosis not present

## 2020-05-11 DIAGNOSIS — N3946 Mixed incontinence: Secondary | ICD-10-CM | POA: Diagnosis not present

## 2020-05-11 DIAGNOSIS — Z9181 History of falling: Secondary | ICD-10-CM | POA: Diagnosis not present

## 2020-05-11 DIAGNOSIS — R32 Unspecified urinary incontinence: Secondary | ICD-10-CM | POA: Diagnosis not present

## 2020-05-11 DIAGNOSIS — R2681 Unsteadiness on feet: Secondary | ICD-10-CM | POA: Diagnosis not present

## 2020-05-11 DIAGNOSIS — M25662 Stiffness of left knee, not elsewhere classified: Secondary | ICD-10-CM | POA: Diagnosis not present

## 2020-05-11 DIAGNOSIS — M150 Primary generalized (osteo)arthritis: Secondary | ICD-10-CM | POA: Diagnosis not present

## 2020-05-11 DIAGNOSIS — M6281 Muscle weakness (generalized): Secondary | ICD-10-CM | POA: Diagnosis not present

## 2020-05-11 DIAGNOSIS — I5032 Chronic diastolic (congestive) heart failure: Secondary | ICD-10-CM | POA: Diagnosis not present

## 2020-05-11 DIAGNOSIS — M25661 Stiffness of right knee, not elsewhere classified: Secondary | ICD-10-CM | POA: Diagnosis not present

## 2020-05-14 ENCOUNTER — Encounter: Payer: Self-pay | Admitting: Nurse Practitioner

## 2020-05-14 DIAGNOSIS — M6281 Muscle weakness (generalized): Secondary | ICD-10-CM | POA: Diagnosis not present

## 2020-05-14 DIAGNOSIS — N3946 Mixed incontinence: Secondary | ICD-10-CM | POA: Diagnosis not present

## 2020-05-14 DIAGNOSIS — R32 Unspecified urinary incontinence: Secondary | ICD-10-CM | POA: Diagnosis not present

## 2020-05-14 DIAGNOSIS — R2681 Unsteadiness on feet: Secondary | ICD-10-CM | POA: Diagnosis not present

## 2020-05-14 DIAGNOSIS — M25662 Stiffness of left knee, not elsewhere classified: Secondary | ICD-10-CM | POA: Diagnosis not present

## 2020-05-14 DIAGNOSIS — Z9181 History of falling: Secondary | ICD-10-CM | POA: Diagnosis not present

## 2020-05-14 DIAGNOSIS — M25661 Stiffness of right knee, not elsewhere classified: Secondary | ICD-10-CM | POA: Diagnosis not present

## 2020-05-14 DIAGNOSIS — R29898 Other symptoms and signs involving the musculoskeletal system: Secondary | ICD-10-CM | POA: Diagnosis not present

## 2020-05-14 DIAGNOSIS — I5032 Chronic diastolic (congestive) heart failure: Secondary | ICD-10-CM | POA: Diagnosis not present

## 2020-05-14 DIAGNOSIS — M150 Primary generalized (osteo)arthritis: Secondary | ICD-10-CM | POA: Diagnosis not present

## 2020-05-15 DIAGNOSIS — M150 Primary generalized (osteo)arthritis: Secondary | ICD-10-CM | POA: Diagnosis not present

## 2020-05-15 DIAGNOSIS — Z9181 History of falling: Secondary | ICD-10-CM | POA: Diagnosis not present

## 2020-05-15 DIAGNOSIS — I5032 Chronic diastolic (congestive) heart failure: Secondary | ICD-10-CM | POA: Diagnosis not present

## 2020-05-15 DIAGNOSIS — R29898 Other symptoms and signs involving the musculoskeletal system: Secondary | ICD-10-CM | POA: Diagnosis not present

## 2020-05-15 DIAGNOSIS — R32 Unspecified urinary incontinence: Secondary | ICD-10-CM | POA: Diagnosis not present

## 2020-05-15 DIAGNOSIS — M25661 Stiffness of right knee, not elsewhere classified: Secondary | ICD-10-CM | POA: Diagnosis not present

## 2020-05-15 DIAGNOSIS — M6281 Muscle weakness (generalized): Secondary | ICD-10-CM | POA: Diagnosis not present

## 2020-05-15 DIAGNOSIS — M25662 Stiffness of left knee, not elsewhere classified: Secondary | ICD-10-CM | POA: Diagnosis not present

## 2020-05-15 DIAGNOSIS — N3946 Mixed incontinence: Secondary | ICD-10-CM | POA: Diagnosis not present

## 2020-05-15 DIAGNOSIS — R2681 Unsteadiness on feet: Secondary | ICD-10-CM | POA: Diagnosis not present

## 2020-05-28 ENCOUNTER — Non-Acute Institutional Stay (SKILLED_NURSING_FACILITY): Payer: Medicare HMO | Admitting: Nurse Practitioner

## 2020-05-28 ENCOUNTER — Encounter: Payer: Self-pay | Admitting: Nurse Practitioner

## 2020-05-28 DIAGNOSIS — I872 Venous insufficiency (chronic) (peripheral): Secondary | ICD-10-CM | POA: Diagnosis not present

## 2020-05-28 DIAGNOSIS — K219 Gastro-esophageal reflux disease without esophagitis: Secondary | ICD-10-CM | POA: Diagnosis not present

## 2020-05-28 DIAGNOSIS — G40209 Localization-related (focal) (partial) symptomatic epilepsy and epileptic syndromes with complex partial seizures, not intractable, without status epilepticus: Secondary | ICD-10-CM

## 2020-05-28 DIAGNOSIS — M8949 Other hypertrophic osteoarthropathy, multiple sites: Secondary | ICD-10-CM | POA: Diagnosis not present

## 2020-05-28 DIAGNOSIS — I2583 Coronary atherosclerosis due to lipid rich plaque: Secondary | ICD-10-CM

## 2020-05-28 DIAGNOSIS — M5441 Lumbago with sciatica, right side: Secondary | ICD-10-CM

## 2020-05-28 DIAGNOSIS — I5032 Chronic diastolic (congestive) heart failure: Secondary | ICD-10-CM | POA: Diagnosis not present

## 2020-05-28 DIAGNOSIS — R69 Illness, unspecified: Secondary | ICD-10-CM | POA: Diagnosis not present

## 2020-05-28 DIAGNOSIS — I4891 Unspecified atrial fibrillation: Secondary | ICD-10-CM

## 2020-05-28 DIAGNOSIS — F5101 Primary insomnia: Secondary | ICD-10-CM | POA: Diagnosis not present

## 2020-05-28 DIAGNOSIS — M159 Polyosteoarthritis, unspecified: Secondary | ICD-10-CM

## 2020-05-28 DIAGNOSIS — I2510 Atherosclerotic heart disease of native coronary artery without angina pectoris: Secondary | ICD-10-CM | POA: Diagnosis not present

## 2020-05-28 DIAGNOSIS — I10 Essential (primary) hypertension: Secondary | ICD-10-CM | POA: Diagnosis not present

## 2020-05-28 NOTE — Assessment & Plan Note (Signed)
HTN, blood pressure is controled on amlodipine 5mg  qd, Lisinopril 40mg  qd.

## 2020-05-28 NOTE — Assessment & Plan Note (Signed)
GERD, takes Omeprazole 20mg  qd.

## 2020-05-28 NOTE — Progress Notes (Signed)
Location:    [facility] Room Number: 61 Place of Service:  SNF (31) Provider: Lennie Odor Aleasha Fregeau NP  [name], MD  Patient Care Team: [name], MD as PCP - General (Internal Medicine) Gatha Mayer, MD as Consulting Physician (Gastroenterology) [name], [name], MD as Consulting Physician (Cardiology) [name], MD as Attending Physician (Internal Medicine) Zelie Asbill X, NP as Nurse Practitioner (Internal Medicine)  Extended Emergency Contact Information Primary Emergency Contact: Moure,[name] Address: [place], [place] [address] [name] of [place] Phone: [phone] Mobile Phone: [phone] Relation: Daughter Secondary Emergency Contact: Mauney,[name] Address: [place] [address] [name] of [place] Phone: [phone] Mobile Phone: [phone] Relation: Son  Code Status:  DNR Goals of care: Advanced Directive information Advanced Directives 05/09/2020  Does Patient Have a Medical Advance Directive? Yes  Type of Paramedic of [place];Living will;Out of facility DNR (pink MOST or yellow form)  Does patient want to make changes to medical advance directive? No - Guardian declined  Copy of [place] in Chart? Yes - validated most recent copy scanned in chart (See row information)  Pre-existing out of facility DNR order (yellow form or pink MOST form) -     Chief Complaint  Patient presents with   Medical Management of Chronic Issues    HPI:  Pt is a 86 y.o. male seen today for medical management of chronic diseases.       PVD, BLE brownish pigmentation, mild erythematous shins.  Afib, onset 09/2019, heart rate is conrolled, not a candidate for anticoagulation,  CHF, EF 63-78%, grade 1 diastolic dysfunction,offFurosemide 40mg  qd, saw Cardiology 02/06/20. Bun/creat 15/1.0  04/06/20 HTN, blood pressure is controled on amlodipine 5mg  qd, Lisinopril 40mg  qd.  Hx of CAD, hyperlipidemia, on Crestor, refused ASA. LDL 41 04/06/20 GERD, takes Omeprazole 20mg  qd.  Insomnia, stable, on Trazodone Chronic lower back pain/right hip pain, on Gabapentin 200mg  qd OA, s/p left hip surgery, chronic right hip pain, w/c helps, prn Tylenol, Methocarbamol 250mg  qam/daily prn Seizure: takes Keppra 500mg  bid. Last seen by neurology 05/09/20   Past Medical History:  Diagnosis Date   Allergic rhinitis    Amnesia    Atherosclerotic heart disease of native coronary artery without angina pectoris    BPH (benign prostatic hyperplasia)    Bradycardia    Chronic diastolic (congestive) heart failure (HCC)    Colon polyps    Coronary artery disease    MODERATE   Dizziness    Edema of lower extremity    Erectile dysfunction    H. pylori infection    Hx of    Hard of hearing    Headache(784.0)    History of colon polyps 2009   Dr. Orr/Colonoscopy -small cecal adenoma   History of falling    Hx of colonoscopy 2009   Dr Lajoyce Corners, hemorrhoids & polyps   Hyperlipidemia    Hypertension    Hypertensive heart disease with heart failure [facility])    Hypokalemia    Internal hemorrhoids 2009   Dr. Lajoyce Corners Colonoscopy   Metabolic encephalopathy    Mitral valve regurgitation    Muscle weakness (generalized)    Pneumonia    Primary generalized (osteo)arthritis    Reflux esophagitis    Sepsis ([place]) 11/04/2018   Unspecified hemorrhoids    Unsteadiness on feet  Vitamin D deficiency    Past Surgical History:  Procedure Laterality Date   APPENDECTOMY     CARDIAC CATHETERIZATION  1997   REVEALED MILD TO MODERATE IRREGULARITIES. HIS LEFT EVNTRICULAR SYSTOLIC FUNCTION REVEALED NORMAL EJECTION FRACTION WITH EF OF 70%   CATARACT EXTRACTION, BILATERAL     COLONOSCOPY  multiple    fatty tumor removal     benign   HEMORROIDECTOMY     HIP ARTHROPLASTY Left 08/17/2013   Procedure: ARTHROPLASTY BIPOLAR HIP;  Surgeon: Gearlean Alf, MD;  Location: [facility];  Service: Orthopedics;  Laterality: Left;   INGUINAL HERNIA REPAIR Bilateral 2006   lower back surgery  2006   NASAL SEPTUM SURGERY     TONSILLECTOMY     UPPER GASTROINTESTINAL ENDOSCOPY      No Known Allergies  Allergies as of 05/28/2020   No Known Allergies     Medication List       Accurate as of May 28, 2020 11:59 PM. If you have any questions, ask your nurse or doctor.        acetaminophen 500 MG tablet Commonly known as: TYLENOL Take 500 mg by mouth as needed.   amLODipine 5 MG tablet Commonly known as: NORVASC Take 5 mg by mouth daily.   aspirin 81 MG chewable tablet Chew 81 mg by mouth daily.   Carboxymethylcellulose Sod PF 0.5 % Soln Place 1 drop into both eyes daily as needed (dry eyes).   gabapentin 100 MG capsule Commonly known as: NEURONTIN Take 200 mg by mouth at bedtime.   hydrocortisone 2.5 % rectal cream Commonly known as: ANUSOL-HC Place 1 application rectally daily as needed for hemorrhoids or anal itching (USE PERINEAL APPLICATOR). With perineal applicator   levETIRAcetam 500 MG tablet Commonly known as: KEPPRA Take 1 tablet (500 mg total) by mouth 2 (two) times daily.   lisinopril 40 MG tablet Commonly known as: ZESTRIL Take 40 mg by mouth daily.   methocarbamol 500 MG tablet Commonly known as: ROBAXIN Take 250 mg by mouth as needed. Take 250 mg by mouth in the morning and an additional 250 mg once a day as needed for muscle spasms   omeprazole 20 MG capsule Commonly known as: PRILOSEC Take 20 mg by mouth daily.   rosuvastatin 5 MG tablet Commonly known as: CRESTOR Take 5 mg by mouth daily.   sodium chloride 0.65 % nasal spray Commonly known as: OCEAN Place 1 spray into the nose daily as needed for congestion (BOTH NOSTRILS).   Thera-M  Tabs Take 1 tablet by mouth daily.   traZODone 50 MG tablet Commonly known as: DESYREL Take 0.5 tablets (25 mg total) by mouth at bedtime.   vitamin B-12 1000 MCG tablet Commonly known as: CYANOCOBALAMIN Take 1,000 mcg by mouth daily.       Review of Systems  Constitutional: Negative for activity change, appetite change and fever.  HENT: Positive for hearing loss. Negative for congestion and voice change.   Eyes: Negative for visual disturbance.  Respiratory: Negative for cough and shortness of breath.   Cardiovascular: Positive for leg swelling.  Gastrointestinal: Negative for abdominal pain and constipation.  Genitourinary: Negative for difficulty urinating, dysuria and urgency.  Musculoskeletal: Positive for arthralgias, back pain and gait problem.       Lower back, right hip/thigh pain   Skin: Negative for color change.  Neurological: Negative for dizziness, seizures, speech difficulty, weakness and headaches.       Memory lapses. Tingling in toes.   Psychiatric/Behavioral:  Negative for behavioral problems and sleep disturbance. The patient is not nervous/anxious.     Immunization History  Administered Date(s) Administered   Influenza, High Dose Seasonal PF 03/19/2019, 03/19/2019   Influenza-Unspecified 04/08/2016, 03/27/2020   Moderna Sars-Covid-2 Vaccination 06/18/2019, 07/16/2019   Pneumococcal Conjugate-13 06/02/2013   Pneumococcal Polysaccharide-23 04/07/2001   Pertinent  Health Maintenance Due  Topic Date Due   INFLUENZA VACCINE  Completed   PNA vac Low Risk Adult  Completed   Fall Risk  11/03/2017 07/11/2016  Falls in the past year? Yes Yes  Number falls in past yr: 2 or more 2 or more  Injury with Fall? Yes Yes  Comment - broke toe on right foot  Risk Factor Category  High Fall Risk -  Follow up Education provided;Falls prevention discussed -   Functional Status Survey:    Vitals:   05/28/20 1514 05/28/20 1516  BP: 132/70 132/70  Pulse: 74 74   Resp: 18 18  Temp: (!) 97.5 F (36.4 C) (!) 97.5 F (36.4 C)  SpO2: 97% 97%  Weight: 164 lb 14.4 oz (74.8 kg) 164 lb 14.4 oz (74.8 kg)  Height: 5\' 7"  (1.702 m) 5\' 7"  (1.702 m)   Body mass index is 25.83 kg/m. Physical Exam Vitals and nursing note reviewed.  Constitutional:      Appearance: Normal appearance.  HENT:     Head: Normocephalic and atraumatic.     Mouth/Throat:     Mouth: Mucous membranes are moist.  Eyes:     Conjunctiva/sclera: Conjunctivae normal.     Pupils: Pupils are equal, round, and reactive to light.  Cardiovascular:     Rate and Rhythm: Normal rate and regular rhythm.     Heart sounds: Murmur heard.    Pulmonary:     Breath sounds: No rales.  Abdominal:     General: Bowel sounds are normal.     Palpations: Abdomen is soft.     Tenderness: There is no abdominal tenderness.  Genitourinary:    Comments: External hemorrhoids, no bleeding or injury.  Musculoskeletal:     Cervical back: Normal range of motion and neck supple. No muscular tenderness.     Right lower leg: Edema present.     Left lower leg: Edema present.     Comments: 1-2 + edema BLE. W/c for mobility.   Skin:    General: Skin is warm and dry.     Comments: Chronic venous insufficiency skin changes BLE  Neurological:     General: No focal deficit present.     Mental Status: He is alert. Mental status is at baseline.     Gait: Gait abnormal.     Comments: Oriented to person, place.   Psychiatric:        Mood and Affect: Mood normal.        Behavior: Behavior normal.        Thought Content: Thought content normal.     Labs reviewed: Recent Labs    09/22/19 0333 09/23/19 0619 09/23/19 0619 09/27/19 0000 01/03/20 0000 01/12/20 0257 04/06/20 0000  NA 135 137   < > 136* 134* 134* 137  K 3.9 4.3  --  4.1 4.1 3.7 4.0  CL 101 101  --  101 100 95* 104  CO2 23 25  --  29* 27*  --  25*  GLUCOSE 97 107*  --   --   --  131*  --   BUN 11 18   < > 15 13 13  15  CREATININE 0.92 1.03    < > 0.9 0.9 0.90 1.0  CALCIUM 8.8* 9.0  --  8.9 2.9*  --  8.6*   < > = values in this interval not displayed.   Recent Labs    09/18/19 2256 09/27/19 0000 04/06/20 0000  AST 39 20 16  ALT 27 23 16   ALKPHOS 71 60 66  BILITOT 0.8  --   --   PROT 6.3*  --   --   ALBUMIN 3.8 3.4* 3.4*   Recent Labs    09/22/19 0333 09/23/19 0857 09/27/19 0000 01/03/20 0000 01/12/20 0250 01/12/20 0257  WBC 9.5 8.8 10.8 6.1 11.6*  --   NEUTROABS 5.9 5.8 7,096 3,440.00 10.0*  --   HGB 14.1 15.5 13.4* 14.0 14.1 13.9  HCT 41.8 46.2 39* 40* 40.7 41.0  MCV 95.9 96.7  --   --  93.3  --   PLT 208 260 230 188 174  --    Lab Results  Component Value Date   TSH 1.77 04/06/2020   No results found for: HGBA1C Lab Results  Component Value Date   CHOL 94 04/06/2020   HDL 37 04/06/2020   LDLCALC 41 04/06/2020   TRIG 79 04/06/2020   CHOLHDL 2.7 02/17/2017    Significant Diagnostic Results in last 30 days:  No results found.  Assessment/Plan  Complex partial seizure with impairment of consciousness at onset [facility]) Seizure: takes Keppra 500mg  bid. Last seen by neurology 05/09/20   Osteoarthritis, multiple sites OA, s/p left hip surgery, chronic right hip pain, w/c helps, prn Tylenol, Methocarbamol 250mg  qam/daily prn   Lower back pain Chronic lower back pain/right hip pain, on Gabapentin 200mg  qd   Insomnia Insomnia, stable, on Trazodone  GERD (gastroesophageal reflux disease) GERD, takes Omeprazole 20mg  qd.    CAD (coronary artery disease) Hx of CAD, hyperlipidemia, on Crestor, refused ASA. LDL 41 04/06/20  Hypertension HTN, blood pressure is controled on amlodipine 5mg  qd, Lisinopril 40mg  qd.   Chronic diastolic heart failure (HCC) CHF, EF 45-36%, grade 1 diastolic dysfunction,offFurosemide 40mg  qd, saw Cardiology 02/06/20. Bun/creat 15/1.0 04/06/20. 1-2 edema BLE.    New onset atrial fibrillation (HCC) Afib, onset 09/2019, heart rate is  conrolled, not a candidate for anticoagulation,    Venous insufficiency (chronic) (peripheral) PVD, BLE brownish pigmentation, mild erythematous shins.    Family/ staff Communication: plan of care reviewed with the patient and charge nurse.   Labs/tests ordered:  none  Time spend 35 minutes.

## 2020-05-28 NOTE — Assessment & Plan Note (Signed)
OA, s/p left hip surgery, chronic right hip pain, w/c helps, prn Tylenol, Methocarbamol 250mg  qam/daily prn

## 2020-05-28 NOTE — Assessment & Plan Note (Signed)
Afib, onset 09/2019, heart rate is conrolled, not a candidate for anticoagulation,

## 2020-05-28 NOTE — Assessment & Plan Note (Signed)
Chronic lower back pain/right hip pain,  on Gabapentin 200mg qd 

## 2020-05-28 NOTE — Assessment & Plan Note (Signed)
Hx of CAD, hyperlipidemia, on Crestor, refused ASA. LDL 41 04/06/20 

## 2020-05-28 NOTE — Assessment & Plan Note (Addendum)
CHF, EF 44-36%, grade 1 diastolic dysfunction,offFurosemide 40mg  qd, saw Cardiology 02/06/20. Bun/creat 15/1.0 04/06/20. 1-2 edema BLE.

## 2020-05-28 NOTE — Assessment & Plan Note (Signed)
Seizure: takes Keppra 500mg bid. Last seen by neurology 05/09/20              

## 2020-05-28 NOTE — Assessment & Plan Note (Signed)
Insomnia, stable, on Trazodone

## 2020-05-28 NOTE — Assessment & Plan Note (Signed)
PVD, BLE brownish pigmentation,mild erythematous shins.  

## 2020-05-30 ENCOUNTER — Encounter: Payer: Self-pay | Admitting: Nurse Practitioner

## 2020-06-19 ENCOUNTER — Encounter: Payer: Self-pay | Admitting: Internal Medicine

## 2020-06-19 ENCOUNTER — Non-Acute Institutional Stay (SKILLED_NURSING_FACILITY): Payer: Medicare HMO | Admitting: Internal Medicine

## 2020-06-19 DIAGNOSIS — K219 Gastro-esophageal reflux disease without esophagitis: Secondary | ICD-10-CM | POA: Diagnosis not present

## 2020-06-19 DIAGNOSIS — M5441 Lumbago with sciatica, right side: Secondary | ICD-10-CM

## 2020-06-19 DIAGNOSIS — R69 Illness, unspecified: Secondary | ICD-10-CM | POA: Diagnosis not present

## 2020-06-19 DIAGNOSIS — I10 Essential (primary) hypertension: Secondary | ICD-10-CM | POA: Diagnosis not present

## 2020-06-19 DIAGNOSIS — R269 Unspecified abnormalities of gait and mobility: Secondary | ICD-10-CM | POA: Diagnosis not present

## 2020-06-19 DIAGNOSIS — E785 Hyperlipidemia, unspecified: Secondary | ICD-10-CM | POA: Diagnosis not present

## 2020-06-19 DIAGNOSIS — I480 Paroxysmal atrial fibrillation: Secondary | ICD-10-CM

## 2020-06-19 DIAGNOSIS — F5101 Primary insomnia: Secondary | ICD-10-CM | POA: Diagnosis not present

## 2020-06-19 DIAGNOSIS — M8949 Other hypertrophic osteoarthropathy, multiple sites: Secondary | ICD-10-CM

## 2020-06-19 DIAGNOSIS — I5032 Chronic diastolic (congestive) heart failure: Secondary | ICD-10-CM

## 2020-06-19 DIAGNOSIS — G40209 Localization-related (focal) (partial) symptomatic epilepsy and epileptic syndromes with complex partial seizures, not intractable, without status epilepticus: Secondary | ICD-10-CM | POA: Diagnosis not present

## 2020-06-19 DIAGNOSIS — M159 Polyosteoarthritis, unspecified: Secondary | ICD-10-CM

## 2020-06-19 NOTE — Progress Notes (Signed)
Location:  [facility] Special educational needs teacher of Service:  SNF (31)  Provider:   Code Status: DNR Goals of Care:  Advanced Directives 05/09/2020  Does Patient Have a Medical Advance Directive? Yes  Type of Estate agent of [place];Living will;Out of facility DNR (pink MOST or yellow form)  Does patient want to make changes to medical advance directive? No - Guardian declined  Copy of Healthcare Power of Attorney in Chart? Yes - validated most recent copy scanned in chart (See row information)  Pre-existing out of facility DNR order (yellow form or pink MOST form) -     Chief Complaint  Patient presents with  . Medical Management of Chronic Issues    HPI: Patient is a 87 y.o. male seen today for medical management of chronic diseases.    Patient has a history of BPH, bradycardia,  CAD, hypertension, diastolic CHF,  GERD,H/o Left Sphenoid MeningiomaAnd Chronic   recent diagnosis of Seizures  Moved to SNF for recurent falls Is doing well. No Acute issues. No Falls here Has lost some weight but overall stable.  Does have swelling  In his legs but refuses Lasix for now No Nursing issues  Past Medical History:  Diagnosis Date  . Allergic rhinitis   . Amnesia   . Atherosclerotic heart disease of native coronary artery without angina pectoris   . BPH (benign prostatic hyperplasia)   . Bradycardia   . Chronic diastolic (congestive) heart failure (HCC)   . Colon polyps   . Coronary artery disease    MODERATE  . Dizziness   . Edema of lower extremity   . Erectile dysfunction   . H. pylori infection    Hx of   . Hard of hearing   . Headache(784.0)   . History of colon polyps 2009   Dr. Orr/Colonoscopy -small cecal adenoma  . History of falling   . Hx of colonoscopy 2009   Dr Virginia Rochester, hemorrhoids & polyps  . Hyperlipidemia   . Hypertension   . Hypertensive heart disease with heart failure (HCC)   . Hypokalemia   . Internal hemorrhoids 2009   Dr.  Virginia Rochester Colonoscopy  . Metabolic encephalopathy   . Mitral valve regurgitation   . Muscle weakness (generalized)   . Pneumonia   . Primary generalized (osteo)arthritis   . Reflux esophagitis   . Sepsis (HCC) 11/04/2018  . Unspecified hemorrhoids   . Unsteadiness on feet   . Vitamin D deficiency     Past Surgical History:  Procedure Laterality Date  . APPENDECTOMY    . CARDIAC CATHETERIZATION  1997   REVEALED MILD TO MODERATE IRREGULARITIES. HIS LEFT EVNTRICULAR SYSTOLIC FUNCTION REVEALED NORMAL EJECTION FRACTION WITH EF OF 70%  . CATARACT EXTRACTION, BILATERAL    . COLONOSCOPY  multiple  . fatty tumor removal     benign  . HEMORROIDECTOMY    . HIP ARTHROPLASTY Left 08/17/2013   Procedure: ARTHROPLASTY BIPOLAR HIP;  Surgeon: Loanne Drilling, MD;  Location: [facility];  Service: Orthopedics;  Laterality: Left;  . INGUINAL HERNIA REPAIR Bilateral 2006  . lower back surgery  2006  . NASAL SEPTUM SURGERY    . TONSILLECTOMY    . UPPER GASTROINTESTINAL ENDOSCOPY      No Known Allergies  Outpatient Encounter Medications as of 06/19/2020  Medication Sig  . acetaminophen (TYLENOL) 500 MG tablet Take 500 mg by mouth as needed.  Marland Kitchen amLODipine (NORVASC) 5 MG tablet Take 5 mg by mouth daily.  Marland Kitchen  aspirin 81 MG chewable tablet Chew 81 mg by mouth daily.  . Carboxymethylcellulose Sod PF 0.5 % SOLN Place 1 drop into both eyes daily as needed (dry eyes).   . gabapentin (NEURONTIN) 100 MG capsule Take 200 mg by mouth at bedtime.  . hydrocortisone (ANUSOL-HC) 2.5 % rectal cream Place 1 application rectally daily as needed for hemorrhoids or anal itching (USE PERINEAL APPLICATOR). With perineal applicator  . levETIRAcetam (KEPPRA) 500 MG tablet Take 1 tablet (500 mg total) by mouth 2 (two) times daily.  Marland Kitchen lisinopril (ZESTRIL) 40 MG tablet Take 40 mg by mouth daily.  . methocarbamol (ROBAXIN) 500 MG tablet Take 250 mg by mouth as needed. Take 250 mg by mouth in the morning and an additional 250 mg once a day  as needed for muscle spasms  . Multiple Vitamins-Minerals (THERA-M) TABS Take 1 tablet by mouth daily.  Marland Kitchen omeprazole (PRILOSEC) 20 MG capsule Take 20 mg by mouth daily.  . rosuvastatin (CRESTOR) 5 MG tablet Take 5 mg by mouth daily.   . sodium chloride (OCEAN) 0.65 % nasal spray Place 1 spray into the nose daily as needed for congestion (BOTH NOSTRILS).   Marland Kitchen traZODone (DESYREL) 50 MG tablet Take 0.5 tablets (25 mg total) by mouth at bedtime.  . vitamin B-12 (CYANOCOBALAMIN) 1000 MCG tablet Take 1,000 mcg by mouth daily.   No facility-administered encounter medications on file as of 06/19/2020.    Review of Systems:  Review of Systems  Review of Systems  Constitutional: Negative for activity change, appetite change, chills, diaphoresis, fatigue and fever.  HENT: Negative for mouth sores, postnasal drip, rhinorrhea, sinus pain and sore throat.   Respiratory: Negative for apnea, cough, chest tightness, shortness of breath and wheezing.   Cardiovascular: Negative for chest pain, palpitations Gastrointestinal: Negative for abdominal distention, abdominal pain, constipation, diarrhea, nausea and vomiting.  Genitourinary: Negative for dysuria and frequency.  Musculoskeletal: Negative for arthralgias, joint swelling and myalgias.  Skin: Negative for rash.  Neurological: Negative for dizziness, syncope, weakness, light-headedness and numbness.  Psychiatric/Behavioral: Negative for behavioral problems, confusion and sleep disturbance.     Health Maintenance  Topic Date Due  . COVID-19 Vaccine (3 - Booster for Moderna series) 01/13/2020  . TETANUS/TDAP  08/15/2023  . INFLUENZA VACCINE  Completed  . PNA vac Low Risk Adult  Completed    Physical Exam: Vitals:   06/19/20 1231  BP: 140/70  Pulse: 74  Resp: 20  Temp: 97.6 F (36.4 C)  Weight: 165 lb (74.8 kg)   Body mass index is 25.84 kg/m. Physical Exam  Constitutional: Oriented to person, place, and time. Well-developed and  well-nourished.  HENT:  Head: Normocephalic.  Mouth/Throat: Oropharynx is clear and moist.  Eyes: Pupils are equal, round, and reactive to light.  Neck: Neck supple.  Cardiovascular: Normal rate and normal heart sounds.  No murmur heard. Pulmonary/Chest: Effort normal and breath sounds normal. No respiratory distress. No wheezes. She has no rales.  Abdominal: Soft. Bowel sounds are normal. No distension. There is no tenderness. There is no rebound.  Musculoskeletal: Chronic Venous changes with edema Bilateral Lymphadenopathy: none Neurological: Alert and oriented to person, place, and time.  Skin: Skin is warm and dry.  Psychiatric: Normal mood and affect. Behavior is normal. Thought content normal.    Labs reviewed: Basic Metabolic Panel: Recent Labs    09/22/19 0333 09/23/19 0619 09/23/19 0619 09/27/19 0000 01/03/20 0000 01/12/20 0257 04/06/20 0000  NA 135 137   < > 136* 134* 134*  137  K 3.9 4.3  --  4.1 4.1 3.7 4.0  CL 101 101  --  101 100 95* 104  CO2 23 25  --  29* 27*  --  25*  GLUCOSE 97 107*  --   --   --  131*  --   BUN 11 18   < > 15 13 13 15   CREATININE 0.92 1.03   < > 0.9 0.9 0.90 1.0  CALCIUM 8.8* 9.0  --  8.9 2.9*  --  8.6*  TSH  --   --   --   --  2.88  --  1.77   < > = values in this interval not displayed.   Liver Function Tests: Recent Labs    09/18/19 2256 09/27/19 0000 04/06/20 0000  AST 39 20 16  ALT 27 23 16   ALKPHOS 71 60 66  BILITOT 0.8  --   --   PROT 6.3*  --   --   ALBUMIN 3.8 3.4* 3.4*   No results for input(s): LIPASE, AMYLASE in the last 8760 hours. No results for input(s): AMMONIA in the last 8760 hours. CBC: Recent Labs    09/22/19 0333 09/23/19 0857 09/27/19 0000 01/03/20 0000 01/12/20 0250 01/12/20 0257  WBC 9.5 8.8 10.8 6.1 11.6*  --   NEUTROABS 5.9 5.8 7,096 3,440.00 10.0*  --   HGB 14.1 15.5 13.4* 14.0 14.1 13.9  HCT 41.8 46.2 39* 40* 40.7 41.0  MCV 95.9 96.7  --   --  93.3  --   PLT 208 260 230 188 174  --     Lipid Panel: Recent Labs    06/28/19 0000 01/03/20 0000 04/06/20 0000  CHOL 121 120 94  HDL 46 51 37  LDLCALC 61 55 41  TRIG 62 60 79   No results found for: HGBA1C  Procedures since last visit: No results found.  Assessment/Plan 1. Complex partial seizure with impairment of consciousness at onset [facility]) Doing well on Keppra  2. Primary osteoarthritis involving multiple joints Tylenol and Robaxin  3. Primary insomnia Stable on Trazadone  4. Chronic diastolic heart failure (HCC) Does not want Lasix  5. PAF (paroxysmal atrial fibrillation) ([place]) Not started on Eliquis due to Falls and [place]  6. Essential hypertension On Lisinopril and Norvasc  7. Gastroesophageal reflux disease, unspecified whether esophagitis present On Prilosec  8. Gait abnormality In SNF now Wheelchair depdent  9. Hyperlipidemia, unspecified hyperlipidemia type On Crestor 10. CAD 10. Right-sided low back pain with right-sided sciatica, unspecified chronicity Robaxin and Nurontin    Labs/tests ordered:  * No order type specified * Next appt:  Visit date not found

## 2020-07-10 ENCOUNTER — Encounter: Payer: Self-pay | Admitting: Internal Medicine

## 2020-07-10 NOTE — Progress Notes (Signed)
A user error has taken place.

## 2020-08-02 ENCOUNTER — Encounter: Payer: Self-pay | Admitting: Nurse Practitioner

## 2020-08-02 ENCOUNTER — Non-Acute Institutional Stay (SKILLED_NURSING_FACILITY): Payer: Medicare HMO | Admitting: Nurse Practitioner

## 2020-08-02 DIAGNOSIS — E782 Mixed hyperlipidemia: Secondary | ICD-10-CM | POA: Diagnosis not present

## 2020-08-02 DIAGNOSIS — M5440 Lumbago with sciatica, unspecified side: Secondary | ICD-10-CM

## 2020-08-02 DIAGNOSIS — I10 Essential (primary) hypertension: Secondary | ICD-10-CM | POA: Diagnosis not present

## 2020-08-02 DIAGNOSIS — K219 Gastro-esophageal reflux disease without esophagitis: Secondary | ICD-10-CM | POA: Diagnosis not present

## 2020-08-02 DIAGNOSIS — R569 Unspecified convulsions: Secondary | ICD-10-CM

## 2020-08-02 DIAGNOSIS — I5032 Chronic diastolic (congestive) heart failure: Secondary | ICD-10-CM

## 2020-08-02 DIAGNOSIS — I4891 Unspecified atrial fibrillation: Secondary | ICD-10-CM

## 2020-08-02 DIAGNOSIS — M8949 Other hypertrophic osteoarthropathy, multiple sites: Secondary | ICD-10-CM | POA: Diagnosis not present

## 2020-08-02 DIAGNOSIS — N138 Other obstructive and reflux uropathy: Secondary | ICD-10-CM | POA: Diagnosis not present

## 2020-08-02 DIAGNOSIS — F5101 Primary insomnia: Secondary | ICD-10-CM

## 2020-08-02 DIAGNOSIS — N3020 Other chronic cystitis without hematuria: Secondary | ICD-10-CM | POA: Diagnosis not present

## 2020-08-02 DIAGNOSIS — N529 Male erectile dysfunction, unspecified: Secondary | ICD-10-CM | POA: Diagnosis not present

## 2020-08-02 DIAGNOSIS — M159 Polyosteoarthritis, unspecified: Secondary | ICD-10-CM

## 2020-08-02 DIAGNOSIS — R69 Illness, unspecified: Secondary | ICD-10-CM | POA: Diagnosis not present

## 2020-08-02 DIAGNOSIS — N401 Enlarged prostate with lower urinary tract symptoms: Secondary | ICD-10-CM | POA: Diagnosis not present

## 2020-08-02 NOTE — Progress Notes (Signed)
Location:   [facility] Room Number: 61 Place of Service:  SNF (31) Provider:  Safal Halderman, Lennie Odor NP  [name], MD  Patient Care Team: [name], MD as PCP - General (Internal Medicine) Gatha Mayer, MD as Consulting Physician (Gastroenterology) [name], [name], MD as Consulting Physician (Cardiology) [name], MD as Attending Physician (Internal Medicine) Lyla Jasek X, NP as Nurse Practitioner (Internal Medicine)  Extended Emergency Contact Information Primary Emergency Contact: Longbottom,[name] Address: [place], [place] [address] [name] of [place] Phone: [phone] Mobile Phone: [phone] Relation: Daughter Secondary Emergency Contact: Lorey,[name] Address: [place] [address] [name] of [place] Phone: [phone] Mobile Phone: [phone] Relation: Son  Code Status:  DNR Managed Care Goals of care: Advanced Directive information Advanced Directives 07/10/2020  Does Patient Have a Medical Advance Directive? Yes  Type of Paramedic of [place];Living will  Does patient want to make changes to medical advance directive? No - Patient declined  Copy of [place] in Chart? Yes - validated most recent copy scanned in chart (See row information)  Pre-existing out of facility DNR order (yellow form or pink MOST form) -     Chief Complaint  Patient presents with  . Medical Management of Chronic Issues    HPI:  Pt is a 87 y.o. male seen today for medical management of chronic diseases.    BPH, saw urology 08/02/20    PVD, BLE brownish pigmentation,mild erythematous shins. Afib,onset 09/2019,heart rate is conrolled, not a candidate for anticoagulation,  CHF, EF 00-37%, grade 1 diastolic dysfunction,offFurosemide 40mg  qd, saw Cardiology 02/06/20. Bun/creat 15/1.0  04/06/20 HTN, blood pressure is controled on amlodipine 5mg  qd, Lisinopril 40mg  qd.  Hx of CAD, hyperlipidemia, on Crestor, refused ASA. LDL 41 04/06/20 GERD, takes Omeprazole 20mg  qd.  Insomnia, stable, on Trazodone Chronic lower back pain/right hip pain, on Gabapentin 200mg  qd OA, s/p left hip surgery, chronic right hip pain, w/c helps, prn Tylenol, Methocarbamol 250mg  qam/daily prn Seizure: takes Keppra 500mg  bid. Last seen by neurology 05/09/20     Past Medical History:  Diagnosis Date  . Allergic rhinitis   . Amnesia   . Atherosclerotic heart disease of native coronary artery without angina pectoris   . BPH (benign prostatic hyperplasia)   . Bradycardia   . Chronic diastolic (congestive) heart failure ([place])   . Colon polyps   . Coronary artery disease    MODERATE  . Dizziness   . Edema of lower extremity   . Erectile dysfunction   . H. pylori infection    Hx of   . Hard of hearing   . Headache(784.0)   . History of colon polyps 2009   Dr. Orr/Colonoscopy -small cecal adenoma  . History of falling   . Hx of colonoscopy 2009   Dr Lajoyce Corners, hemorrhoids & polyps  . Hyperlipidemia   . Hypertension   . Hypertensive heart disease with heart failure ([place])   . Hypokalemia   . Internal hemorrhoids 2009   Dr. Lajoyce Corners Colonoscopy  . Metabolic encephalopathy   . Mitral valve regurgitation   . Muscle weakness (generalized)   . Pneumonia   . Primary generalized (osteo)arthritis   . Reflux esophagitis   . Sepsis ([place]) 11/04/2018  . Unspecified hemorrhoids   . Unsteadiness on feet   . Vitamin D  deficiency    Past Surgical History:  Procedure Laterality Date  . APPENDECTOMY    . CARDIAC CATHETERIZATION  1997   REVEALED MILD TO MODERATE IRREGULARITIES. HIS LEFT EVNTRICULAR SYSTOLIC FUNCTION REVEALED NORMAL EJECTION FRACTION WITH EF OF 70%  . CATARACT EXTRACTION, BILATERAL    . COLONOSCOPY   multiple  . fatty tumor removal     benign  . HEMORROIDECTOMY    . HIP ARTHROPLASTY Left 08/17/2013   Procedure: ARTHROPLASTY BIPOLAR HIP;  Surgeon: Gearlean Alf, MD;  Location: [facility];  Service: Orthopedics;  Laterality: Left;  . INGUINAL HERNIA REPAIR Bilateral 2006  . lower back surgery  2006  . NASAL SEPTUM SURGERY    . TONSILLECTOMY    . UPPER GASTROINTESTINAL ENDOSCOPY      No Known Allergies  Allergies as of 08/02/2020   No Known Allergies     Medication List       Accurate as of August 02, 2020 11:59 PM. If you have any questions, ask your nurse or doctor.        acetaminophen 500 MG tablet Commonly known as: TYLENOL Take 500 mg by mouth as needed.   amLODipine 5 MG tablet Commonly known as: NORVASC Take 5 mg by mouth daily.   aspirin 81 MG chewable tablet Chew 81 mg by mouth daily.   Carboxymethylcellulose Sod PF 0.5 % Soln Place 1 drop into both eyes daily as needed (dry eyes).   gabapentin 100 MG capsule Commonly known as: NEURONTIN Take 200 mg by mouth at bedtime.   hydrocortisone 2.5 % rectal cream Commonly known as: ANUSOL-HC Place 1 application rectally daily as needed for hemorrhoids or anal itching (USE PERINEAL APPLICATOR). With perineal applicator   levETIRAcetam 500 MG tablet Commonly known as: KEPPRA Take 1 tablet (500 mg total) by mouth 2 (two) times daily.   lisinopril 40 MG tablet Commonly known as: ZESTRIL Take 40 mg by mouth daily.   methocarbamol 500 MG tablet Commonly known as: ROBAXIN Take 250 mg by mouth as needed. Take 250 mg by mouth in the morning and an additional 250 mg once a day as needed for muscle spasms   omeprazole 20 MG capsule Commonly known as: PRILOSEC Take 20 mg by mouth daily.   rosuvastatin 5 MG tablet Commonly known as: CRESTOR Take 5 mg by mouth daily.   sodium chloride 0.65 % nasal spray Commonly known as: OCEAN Place 1 spray into the nose daily as needed for congestion (BOTH NOSTRILS).    Thera-M Tabs Take 1 tablet by mouth daily.   traZODone 50 MG tablet Commonly known as: DESYREL Take 0.5 tablets (25 mg total) by mouth at bedtime.   vitamin B-12 1000 MCG tablet Commonly known as: CYANOCOBALAMIN Take 1,000 mcg by mouth daily.       Review of Systems  Constitutional: Negative for fatigue, fever and unexpected weight change.  HENT: Positive for hearing loss. Negative for congestion and voice change.   Eyes: Negative for visual disturbance.  Respiratory: Negative for cough and shortness of breath.   Cardiovascular: Positive for leg swelling.  Gastrointestinal: Negative for abdominal pain and constipation.  Genitourinary: Positive for frequency. Negative for difficulty urinating and urgency.  Musculoskeletal: Positive for arthralgias, back pain and gait problem.       Lower back, right hip/thigh pain   Skin: Negative for color change.  Neurological: Negative for seizures, weakness and headaches.       Memory lapses. Tingling in toes.   Psychiatric/Behavioral: Negative for behavioral  problems and sleep disturbance. The patient is not nervous/anxious.     Immunization History  Administered Date(s) Administered  . Influenza, High Dose Seasonal PF 03/19/2019, 03/19/2019  . Influenza-Unspecified 04/08/2016, 03/27/2020  . Moderna Sars-Covid-2 Vaccination 06/18/2019, 07/16/2019, 04/24/2020  . Pneumococcal Conjugate-13 06/02/2013  . Pneumococcal Polysaccharide-23 04/07/2001   Pertinent  Health Maintenance Due  Topic Date Due  . INFLUENZA VACCINE  Completed  . PNA vac Low Risk Adult  Completed   Fall Risk  11/03/2017 07/11/2016  Falls in the past year? Yes Yes  Number falls in past yr: 2 or more 2 or more  Injury with Fall? Yes Yes  Comment - broke toe on right foot  Risk Factor Category  High Fall Risk -  Follow up Education provided;Falls prevention discussed -   Functional Status Survey:    Vitals:   08/02/20 1502  BP: 140/66  Pulse: 66  Resp: 20   Temp: 98.1 F (36.7 C)  SpO2: 94%  Weight: 172 lb 9.6 oz (78.3 kg)  Height: 5\' 7"  (1.702 m)   Body mass index is 27.03 kg/m. Physical Exam Vitals and nursing note reviewed.  Constitutional:      Appearance: Normal appearance.  HENT:     Head: Normocephalic and atraumatic.     Mouth/Throat:     Mouth: Mucous membranes are moist.  Eyes:     Conjunctiva/sclera: Conjunctivae normal.     Pupils: Pupils are equal, round, and reactive to light.  Cardiovascular:     Rate and Rhythm: Normal rate and regular rhythm.     Heart sounds: Murmur heard.    Pulmonary:     Breath sounds: No rales.  Abdominal:     General: Bowel sounds are normal.     Palpations: Abdomen is soft.     Tenderness: There is no abdominal tenderness.  Genitourinary:    Comments: External hemorrhoids, no bleeding or injury.  Musculoskeletal:     Cervical back: Normal range of motion and neck supple. No muscular tenderness.     Right lower leg: Edema present.     Left lower leg: Edema present.     Comments: 1  edema BLE. W/c for mobility.   Skin:    General: Skin is warm and dry.     Comments: Chronic venous insufficiency skin changes BLE  Neurological:     General: No focal deficit present.     Mental Status: He is alert. Mental status is at baseline.     Gait: Gait abnormal.     Comments: Oriented to person, place.   Psychiatric:        Mood and Affect: Mood normal.        Behavior: Behavior normal.        Thought Content: Thought content normal.     Labs reviewed: Recent Labs    09/22/19 0333 09/23/19 0619 09/23/19 0619 09/27/19 0000 01/03/20 0000 01/12/20 0257 04/06/20 0000  NA 135 137   < > 136* 134* 134* 137  K 3.9 4.3  --  4.1 4.1 3.7 4.0  CL 101 101  --  101 100 95* 104  CO2 23 25  --  29* 27*  --  25*  GLUCOSE 97 107*  --   --   --  131*  --   BUN 11 18   < > 15 13 13 15   CREATININE 0.92 1.03   < > 0.9 0.9 0.90 1.0  CALCIUM 8.8* 9.0  --  8.9 2.9*  --  8.6*   < > = values in this  interval not displayed.   Recent Labs    09/18/19 2256 09/27/19 0000 04/06/20 0000  AST 39 20 16  ALT 27 23 16   ALKPHOS 71 60 66  BILITOT 0.8  --   --   PROT 6.3*  --   --   ALBUMIN 3.8 3.4* 3.4*   Recent Labs    09/22/19 0333 09/23/19 0857 09/27/19 0000 01/03/20 0000 01/12/20 0250 01/12/20 0257  WBC 9.5 8.8 10.8 6.1 11.6*  --   NEUTROABS 5.9 5.8 7,096 3,440.00 10.0*  --   HGB 14.1 15.5 13.4* 14.0 14.1 13.9  HCT 41.8 46.2 39* 40* 40.7 41.0  MCV 95.9 96.7  --   --  93.3  --   PLT 208 260 230 188 174  --    Lab Results  Component Value Date   TSH 1.77 04/06/2020   No results found for: HGBA1C Lab Results  Component Value Date   CHOL 94 04/06/2020   HDL 37 04/06/2020   LDLCALC 41 04/06/2020   TRIG 79 04/06/2020   CHOLHDL 2.7 02/17/2017    Significant Diagnostic Results in last 30 days:  No results found.  Assessment/Plan New onset atrial fibrillation ([place]) onset 09/2019,heart rate is conrolled, not a candidate for anticoagulation,   Chronic diastolic heart failure (HCC) EF 83-35%, grade 1 diastolic dysfunction,offFurosemide 40mg  qd, saw Cardiology 02/06/20. Bun/creat 15/1.0 04/06/20  Hypertension  blood pressure is controled on amlodipine 5mg  qd, Lisinopril 40mg  qd.    Hyperlipidemia CAD, hyperlipidemia, on Crestor, refused ASA. LDL 41 04/06/20  GERD (gastroesophageal reflux disease) takes Omeprazole 20mg  qd.   Insomnia stable, on Trazodone  Lower back pain Chronic lower back pain/right hip pain, on Gabapentin 200mg  qd   Osteoarthritis, multiple sites  s/p left hip surgery, chronic right hip pain, w/c helps, prn Tylenol, Methocarbamol 250mg  qam/daily prn  Seizure (HCC) takes Keppra 500mg  bid. Last seen by neurology 05/09/20      Family/ staff Communication: plan of care reviewed with the patient and charge nurse  Labs/tests ordered:  none  Time spend 35 minutes

## 2020-08-07 ENCOUNTER — Encounter: Payer: Self-pay | Admitting: Nurse Practitioner

## 2020-08-07 NOTE — Assessment & Plan Note (Signed)
stable, on Trazodone  

## 2020-08-07 NOTE — Assessment & Plan Note (Signed)
takes Keppra 500mg  bid. Last seen by neurology 05/09/20

## 2020-08-07 NOTE — Assessment & Plan Note (Signed)
Chronic lower back pain/right hip pain,  on Gabapentin 200mg qd 

## 2020-08-07 NOTE — Assessment & Plan Note (Signed)
EF 30-10%, grade 1 diastolic dysfunction,offFurosemide 40mg  qd, saw Cardiology 02/06/20. Bun/creat 15/1.0 04/06/20

## 2020-08-07 NOTE — Assessment & Plan Note (Signed)
blood pressure is controled on amlodipine 5mg  qd, Lisinopril 40mg  qd.

## 2020-08-07 NOTE — Assessment & Plan Note (Signed)
s/p left hip surgery, chronic right hip pain, w/c helps, prn Tylenol, Methocarbamol 250mg  qam/daily prn

## 2020-08-07 NOTE — Assessment & Plan Note (Signed)
CAD, hyperlipidemia, on Crestor, refused ASA. LDL 41 04/06/20

## 2020-08-07 NOTE — Assessment & Plan Note (Signed)
takes Omeprazole 20mg  qd.

## 2020-08-07 NOTE — Assessment & Plan Note (Signed)
onset 09/2019,heart rate is conrolled, not a candidate for anticoagulation,

## 2020-08-27 ENCOUNTER — Non-Acute Institutional Stay (SKILLED_NURSING_FACILITY): Payer: Medicare HMO | Admitting: Nurse Practitioner

## 2020-08-27 ENCOUNTER — Encounter: Payer: Self-pay | Admitting: Nurse Practitioner

## 2020-08-27 DIAGNOSIS — M159 Polyosteoarthritis, unspecified: Secondary | ICD-10-CM

## 2020-08-27 DIAGNOSIS — K219 Gastro-esophageal reflux disease without esophagitis: Secondary | ICD-10-CM

## 2020-08-27 DIAGNOSIS — N138 Other obstructive and reflux uropathy: Secondary | ICD-10-CM

## 2020-08-27 DIAGNOSIS — R569 Unspecified convulsions: Secondary | ICD-10-CM

## 2020-08-27 DIAGNOSIS — N401 Enlarged prostate with lower urinary tract symptoms: Secondary | ICD-10-CM | POA: Diagnosis not present

## 2020-08-27 DIAGNOSIS — M8949 Other hypertrophic osteoarthropathy, multiple sites: Secondary | ICD-10-CM

## 2020-08-27 DIAGNOSIS — I5032 Chronic diastolic (congestive) heart failure: Secondary | ICD-10-CM

## 2020-08-27 DIAGNOSIS — I4891 Unspecified atrial fibrillation: Secondary | ICD-10-CM | POA: Diagnosis not present

## 2020-08-27 DIAGNOSIS — I10 Essential (primary) hypertension: Secondary | ICD-10-CM | POA: Diagnosis not present

## 2020-08-27 DIAGNOSIS — W19XXXA Unspecified fall, initial encounter: Secondary | ICD-10-CM

## 2020-08-27 DIAGNOSIS — F5101 Primary insomnia: Secondary | ICD-10-CM

## 2020-08-27 DIAGNOSIS — I872 Venous insufficiency (chronic) (peripheral): Secondary | ICD-10-CM | POA: Diagnosis not present

## 2020-08-27 DIAGNOSIS — I2510 Atherosclerotic heart disease of native coronary artery without angina pectoris: Secondary | ICD-10-CM | POA: Diagnosis not present

## 2020-08-27 DIAGNOSIS — R69 Illness, unspecified: Secondary | ICD-10-CM | POA: Diagnosis not present

## 2020-08-27 NOTE — Assessment & Plan Note (Signed)
takes Omeprazole 20mg  qd.

## 2020-08-27 NOTE — Assessment & Plan Note (Signed)
Mildly elevated, continue Amlodipine, Lisinopril, add Furosemide in setting of swelling.

## 2020-08-27 NOTE — Assessment & Plan Note (Signed)
BPH, saw urology 08/02/20, chronic urinary frequency.

## 2020-08-27 NOTE — Assessment & Plan Note (Signed)
Hx of CAD, hyperlipidemia, on Crestor, refused ASA. LDL 41 04/06/20

## 2020-08-27 NOTE — Assessment & Plan Note (Signed)
Afib,onset 09/2019,heart rate is conrolled, not a candidate for anticoagulation

## 2020-08-27 NOTE — Assessment & Plan Note (Signed)
CHF, EF 34-19%, grade 1 diastolic dysfunction,offFurosemide 40mg  qd, saw Cardiology 02/06/20. Bun/creat 15/1.0 04/06/20. Will try Furosemide 20mg  +Kcl 50meq qd in setting of mildly elevated Bp, more swelling, wight gains. BMP one week.

## 2020-08-27 NOTE — Assessment & Plan Note (Signed)
PVD, BLE brownish pigmentation,mild erythematous shins.

## 2020-08-27 NOTE — Assessment & Plan Note (Signed)
Seizure: takes Keppra 500mg  bid. Last seen by neurology 05/09/20

## 2020-08-27 NOTE — Assessment & Plan Note (Signed)
Recurrent falls, due to unsteady on his legs, attempting standing up or transfer with no assistance.

## 2020-08-27 NOTE — Progress Notes (Signed)
Location:   [facility] Room Number: 61 Place of Service:  SNF (31) Provider:  Elianie Hubers, Lennie Odor NP  [name], MD  Patient Care Team: [name], MD as PCP - General (Internal Medicine) Gatha Mayer, MD as Consulting Physician (Gastroenterology) [name], [name], MD as Consulting Physician (Cardiology) [name], MD as Attending Physician (Internal Medicine) Lovell Nuttall X, NP as Nurse Practitioner (Internal Medicine)  Extended Emergency Contact Information Primary Emergency Contact: Strome,[name] Address: [place],  [address] [name] of [place] Phone: [phone] Mobile Phone: [phone] Relation: Daughter Secondary Emergency Contact: Guadamuz,[name] Address: [place] [address] [name] of [place] Phone: [phone] Mobile Phone: [phone] Relation: Son  Code Status:  DNR Managed Care Goals of care: Advanced Directive information Advanced Directives 08/27/2020  Does Patient Have a Medical Advance Directive? Yes  Type of Paramedic of [place];Living will  Does patient want to make changes to medical advance directive? -  Copy of [place] in Chart? Yes - validated most recent copy scanned in chart (See row information)  Pre-existing out of facility DNR order (yellow form or pink MOST form) -     Chief Complaint  Patient presents with  . Medical Management of Chronic Issues    HPI:  Pt is a 87 y.o. male seen today for medical management of chronic diseases.    Recurrent falls, due to unsteady on his legs, attempting standing up or transfer with no assistance.   BPH, saw urology 08/02/20              PVD, BLE brownish pigmentation,mild erythematous shins. Afib,onset 09/2019,heart rate is conrolled, not a candidate for anticoagulation,  CHF, EF 94-17%, grade 1 diastolic  dysfunction,offFurosemide 40mg  qd, saw Cardiology 02/06/20. Bun/creat 15/1.0 04/06/20 HTN, blood pressure is controled on amlodipine 5mg  qd, Lisinopril 40mg  qd.  Hx of CAD, hyperlipidemia, on Crestor, refused ASA. LDL 41 04/06/20 GERD, takes Omeprazole 20mg  qd.  Insomnia, stable, on Trazodone Chronic lower back pain/right hip pain, on Gabapentin 200mg  qd OA, s/p left hip surgery, chronic right hip pain, w/c helps, prn Tylenol, Methocarbamol 250mg  qam/daily prn Seizure: takes Keppra 500mg  bid. Last seen by neurology 05/09/20              Past Medical History:  Diagnosis Date  . Allergic rhinitis   . Amnesia   . Atherosclerotic heart disease of native coronary artery without angina pectoris   . BPH (benign prostatic hyperplasia)   . Bradycardia   . Chronic diastolic (congestive) heart failure ([place])   . Colon polyps   . Coronary artery disease    MODERATE  . Dizziness   . Edema of lower extremity   . Erectile dysfunction   . H. pylori infection    Hx of   . Hard of hearing   . Headache(784.0)   . History of colon polyps 2009   Dr. Orr/Colonoscopy -small cecal adenoma  . History of falling   . Hx of colonoscopy 2009   Dr Lajoyce Corners, hemorrhoids & polyps  . Hyperlipidemia   . Hypertension   . Hypertensive heart disease with heart failure ([place])   . Hypokalemia   . Internal hemorrhoids 2009   Dr. Lajoyce Corners Colonoscopy  . Metabolic encephalopathy   . Mitral valve regurgitation   . Muscle weakness (generalized)   .  Pneumonia   . Primary generalized (osteo)arthritis   . Reflux esophagitis   . Sepsis ([place]) 11/04/2018  . Unspecified hemorrhoids   . Unsteadiness on feet   . Vitamin D deficiency    Past Surgical History:  Procedure Laterality Date  . APPENDECTOMY    . CARDIAC CATHETERIZATION  1997   REVEALED MILD TO MODERATE IRREGULARITIES. HIS LEFT EVNTRICULAR SYSTOLIC FUNCTION REVEALED NORMAL  EJECTION FRACTION WITH EF OF 70%  . CATARACT EXTRACTION, BILATERAL    . COLONOSCOPY  multiple  . fatty tumor removal     benign  . HEMORROIDECTOMY    . HIP ARTHROPLASTY Left 08/17/2013   Procedure: ARTHROPLASTY BIPOLAR HIP;  Surgeon: Gearlean Alf, MD;  Location: [facility];  Service: Orthopedics;  Laterality: Left;  . INGUINAL HERNIA REPAIR Bilateral 2006  . lower back surgery  2006  . NASAL SEPTUM SURGERY    . TONSILLECTOMY    . UPPER GASTROINTESTINAL ENDOSCOPY      No Known Allergies  Allergies as of 08/27/2020   No Known Allergies     Medication List       Accurate as of August 27, 2020  4:58 PM. If you have any questions, ask your nurse or doctor.        STOP taking these medications   sodium chloride 0.65 % nasal spray Commonly known as: OCEAN Stopped by: Jaimey Franchini X Jalen Oberry, NP     TAKE these medications   acetaminophen 500 MG tablet Commonly known as: TYLENOL Take 500 mg by mouth as needed.   amLODipine 5 MG tablet Commonly known as: NORVASC Take 5 mg by mouth daily.   aspirin 81 MG chewable tablet Chew 81 mg by mouth daily.   Carboxymethylcellulose Sod PF 0.5 % Soln Place 1 drop into both eyes daily as needed (dry eyes).   gabapentin 100 MG capsule Commonly known as: NEURONTIN Take 200 mg by mouth at bedtime.   hydrocortisone 2.5 % rectal cream Commonly known as: ANUSOL-HC Place 1 application rectally daily as needed for hemorrhoids or anal itching (USE PERINEAL APPLICATOR). With perineal applicator   levETIRAcetam 500 MG tablet Commonly known as: KEPPRA Take 1 tablet (500 mg total) by mouth 2 (two) times daily.   lisinopril 40 MG tablet Commonly known as: ZESTRIL Take 40 mg by mouth daily.   methocarbamol 500 MG tablet Commonly known as: ROBAXIN Take 250 mg by mouth as needed. Take 250 mg by mouth in the morning and an additional 250 mg once a day as needed for muscle spasms   omeprazole 20 MG capsule Commonly known as: PRILOSEC Take 20 mg by mouth  daily.   rosuvastatin 5 MG tablet Commonly known as: CRESTOR Take 5 mg by mouth daily.   Thera-M Tabs Take 1 tablet by mouth daily.   traZODone 50 MG tablet Commonly known as: DESYREL Take 0.5 tablets (25 mg total) by mouth at bedtime.   vitamin B-12 1000 MCG tablet Commonly known as: CYANOCOBALAMIN Take 1,000 mcg by mouth daily.       Review of Systems  Constitutional: Positive for unexpected weight change. Negative for fatigue and fever.       Weight gained.   HENT: Positive for hearing loss. Negative for congestion and voice change.   Eyes: Negative for visual disturbance.  Respiratory: Negative for cough and shortness of breath.   Cardiovascular: Positive for leg swelling.  Gastrointestinal: Negative for abdominal pain and constipation.  Genitourinary: Positive for frequency. Negative for difficulty urinating and urgency.  Musculoskeletal:  Positive for arthralgias, back pain and gait problem.       Lower back, right hip/thigh pain   Skin: Negative for color change.  Neurological: Negative for seizures, weakness and headaches.       Memory lapses. Tingling in toes.   Psychiatric/Behavioral: Negative for behavioral problems and sleep disturbance. The patient is not nervous/anxious.     Immunization History  Administered Date(s) Administered  . Influenza, High Dose Seasonal PF 03/19/2019, 03/19/2019  . Influenza-Unspecified 04/08/2016, 03/27/2020  . Moderna Sars-Covid-2 Vaccination 06/18/2019, 07/16/2019, 04/24/2020  . Pneumococcal Conjugate-13 06/02/2013  . Pneumococcal Polysaccharide-23 04/07/2001   Pertinent  Health Maintenance Due  Topic Date Due  . INFLUENZA VACCINE  Completed  . PNA vac Low Risk Adult  Completed   Fall Risk  11/03/2017 07/11/2016  Falls in the past year? Yes Yes  Number falls in past yr: 2 or more 2 or more  Injury with Fall? Yes Yes  Comment - broke toe on right foot  Risk Factor Category  High Fall Risk -  Follow up Education  provided;Falls prevention discussed -   Functional Status Survey:    Vitals:   08/27/20 1535  BP: (!) 160/78  Pulse: 66  Resp: 18  Temp: (!) 96.8 F (36 C)  SpO2: 98%  Weight: 176 lb 1.6 oz (79.9 kg)  Height: 5\' 7"  (1.702 m)   Body mass index is 27.58 kg/m. Physical Exam Vitals and nursing note reviewed.  Constitutional:      Appearance: Normal appearance.  HENT:     Head: Normocephalic and atraumatic.     Mouth/Throat:     Mouth: Mucous membranes are moist.  Eyes:     Conjunctiva/sclera: Conjunctivae normal.     Pupils: Pupils are equal, round, and reactive to light.  Cardiovascular:     Rate and Rhythm: Normal rate and regular rhythm.     Heart sounds: Murmur heard.    Pulmonary:     Breath sounds: No rales.  Abdominal:     General: Bowel sounds are normal.     Palpations: Abdomen is soft.     Tenderness: There is no abdominal tenderness.  Genitourinary:    Comments: External hemorrhoids, no bleeding or injury.  Musculoskeletal:     Cervical back: Normal range of motion and neck supple. No muscular tenderness.     Right lower leg: Edema present.     Left lower leg: Edema present.     Comments: 2 + edema BLE. W/c for mobility.   Skin:    General: Skin is warm and dry.     Comments: Chronic venous insufficiency skin changes BLE  Neurological:     General: No focal deficit present.     Mental Status: He is alert. Mental status is at baseline.     Gait: Gait abnormal.     Comments: Oriented to person, place.   Psychiatric:        Mood and Affect: Mood normal.        Behavior: Behavior normal.        Thought Content: Thought content normal.     Labs reviewed: Recent Labs    09/22/19 0333 09/23/19 0619 09/23/19 0619 09/27/19 0000 01/03/20 0000 01/12/20 0257 04/06/20 0000  NA 135 137   < > 136* 134* 134* 137  K 3.9 4.3  --  4.1 4.1 3.7 4.0  CL 101 101  --  101 100 95* 104  CO2 23 25  --  29* 27*  --  25*  GLUCOSE 97 107*  --   --   --  131*  --    BUN 11 18   < > 15 13 13 15   CREATININE 0.92 1.03   < > 0.9 0.9 0.90 1.0  CALCIUM 8.8* 9.0  --  8.9 2.9*  --  8.6*   < > = values in this interval not displayed.   Recent Labs    09/18/19 2256 09/27/19 0000 04/06/20 0000  AST 39 20 16  ALT 27 23 16   ALKPHOS 71 60 66  BILITOT 0.8  --   --   PROT 6.3*  --   --   ALBUMIN 3.8 3.4* 3.4*   Recent Labs    09/22/19 0333 09/23/19 0857 09/27/19 0000 01/03/20 0000 01/12/20 0250 01/12/20 0257  WBC 9.5 8.8 10.8 6.1 11.6*  --   NEUTROABS 5.9 5.8 7,096 3,440.00 10.0*  --   HGB 14.1 15.5 13.4* 14.0 14.1 13.9  HCT 41.8 46.2 39* 40* 40.7 41.0  MCV 95.9 96.7  --   --  93.3  --   PLT 208 260 230 188 174  --    Lab Results  Component Value Date   TSH 1.77 04/06/2020   No results found for: HGBA1C Lab Results  Component Value Date   CHOL 94 04/06/2020   HDL 37 04/06/2020   LDLCALC 41 04/06/2020   TRIG 79 04/06/2020   CHOLHDL 2.7 02/17/2017    Significant Diagnostic Results in last 30 days:  No results found.  Assessment/Plan Chronic diastolic heart failure (HCC) CHF, EF 60-45%, grade 1 diastolic dysfunction,offFurosemide 40mg  qd, saw Cardiology 02/06/20. Bun/creat 15/1.0 04/06/20. Will try Furosemide 20mg  +Kcl 16meq qd in setting of mildly elevated Bp, more swelling, wight gains. BMP one week.  Hypertension Mildly elevated, continue Amlodipine, Lisinopril, add Furosemide in setting of swelling.   CAD (coronary artery disease) Hx of CAD, hyperlipidemia, on Crestor, refused ASA. LDL 41 04/06/20  GERD (gastroesophageal reflux disease) takes Omeprazole 20mg  qd.   Insomnia stable, on Trazodone  Osteoarthritis, multiple sites Chronic lower back pain/right hip pain, on Gabapentin 200mg  qd. OA, s/p left hip surgery, chronic right hip pain, w/c helps, prn Tylenol, Methocarbamol 250mg  qam/daily prn    Seizure (HCC) Seizure: takes Keppra 500mg  bid. Last seen by neurology 05/09/20               New onset atrial  fibrillation ([place]) Afib,onset 09/2019,heart rate is conrolled, not a candidate for anticoagulation  Venous insufficiency (chronic) (peripheral) PVD, BLE brownish pigmentation,mild erythematous shins.   BPH (benign prostatic hyperplasia) BPH, saw urology 08/02/20, chronic urinary frequency.   Fall Recurrent falls, due to unsteady on his legs, attempting standing up or transfer with no assistance.      Family/ staff Communication: plan of care reviewed with the patient and charge nurse.   Labs/tests ordered: BMP one week  Time spend 35 minutes.

## 2020-08-27 NOTE — Assessment & Plan Note (Signed)
stable, on Trazodone  

## 2020-08-27 NOTE — Assessment & Plan Note (Addendum)
Chronic lower back pain/right hip pain, on Gabapentin 200mg  qd. OA, s/p left hip surgery, chronic right hip pain, w/c helps, prn Tylenol, Methocarbamol 250mg  qam/daily prn

## 2020-09-04 DIAGNOSIS — I10 Essential (primary) hypertension: Secondary | ICD-10-CM | POA: Diagnosis not present

## 2020-09-04 LAB — COMPREHENSIVE METABOLIC PANEL: Calcium: 9.2 (ref 8.7–10.7)

## 2020-09-04 LAB — BASIC METABOLIC PANEL
BUN: 11 (ref 4–21)
CO2: 29 — AB (ref 13–22)
Chloride: 102 (ref 99–108)
Creatinine: 0.9 (ref 0.6–1.3)
Glucose: 147
Potassium: 4.2 (ref 3.4–5.3)
Sodium: 139 (ref 137–147)

## 2020-09-18 ENCOUNTER — Non-Acute Institutional Stay (SKILLED_NURSING_FACILITY): Payer: Medicare HMO | Admitting: Internal Medicine

## 2020-09-18 ENCOUNTER — Encounter: Payer: Self-pay | Admitting: Internal Medicine

## 2020-09-18 DIAGNOSIS — L03116 Cellulitis of left lower limb: Secondary | ICD-10-CM | POA: Diagnosis not present

## 2020-09-18 DIAGNOSIS — I2510 Atherosclerotic heart disease of native coronary artery without angina pectoris: Secondary | ICD-10-CM

## 2020-09-18 DIAGNOSIS — R600 Localized edema: Secondary | ICD-10-CM | POA: Diagnosis not present

## 2020-09-18 DIAGNOSIS — R569 Unspecified convulsions: Secondary | ICD-10-CM | POA: Diagnosis not present

## 2020-09-18 DIAGNOSIS — I10 Essential (primary) hypertension: Secondary | ICD-10-CM

## 2020-09-18 DIAGNOSIS — R69 Illness, unspecified: Secondary | ICD-10-CM | POA: Diagnosis not present

## 2020-09-18 DIAGNOSIS — F5101 Primary insomnia: Secondary | ICD-10-CM

## 2020-09-18 NOTE — Progress Notes (Signed)
Location:  [facility] Room Number: 91 Place of Service:  SNF [phone])  Provider: [name] MD  Code Status: DNR Managed Care Goals of Care:  Advanced Directives 09/18/2020  Does Patient Have a Medical Advance Directive? Yes  Type of Paramedic of [place];Living will  Does patient want to make changes to medical advance directive? No - Patient declined  Copy of [place] in Chart? Yes - validated most recent copy scanned in chart (See row information)  Pre-existing out of facility DNR order (yellow form or pink MOST form) -     Chief Complaint  Patient presents with  . Medical Management of Chronic Issues  . Acute Visit    Left LE edema and Cellulitis    HPI: Patient is a 87 y.o. male seen today for medical management of chronic diseases.    Patient has a history of BPH, bradycardia,  CAD, hypertension, diastolic CHF,  GERD,H/o Left Sphenoid MeningiomaAnd Chronic   Seizures  His main complain is pain redness and discharge from his left leg. Patient has history of lower extremity edema.  He refuses to take Lasix.  His weight has been on 10 pounds.  Patient has noticed that he has discharge from his left leg  his left leg has pain Red   No fever chills nausea  No shortness of breath No Other  issues Past Medical History:  Diagnosis Date  . Allergic rhinitis   . Amnesia   . Atherosclerotic heart disease of native coronary artery without angina pectoris   . BPH (benign prostatic hyperplasia)   . Bradycardia   . Chronic diastolic (congestive) heart failure ([place])   . Colon polyps   . Coronary artery disease    MODERATE  . Dizziness   . Edema of lower extremity   . Erectile dysfunction   . H. pylori infection    Hx of   . Hard of hearing   . Headache(784.0)   . History of colon polyps 2009   Dr. Orr/Colonoscopy -small cecal adenoma  . History of falling   . Hx of colonoscopy 2009   Dr Lajoyce Corners,  hemorrhoids & polyps  . Hyperlipidemia   . Hypertension   . Hypertensive heart disease with heart failure ([place])   . Hypokalemia   . Internal hemorrhoids 2009   Dr. Lajoyce Corners Colonoscopy  . Metabolic encephalopathy   . Mitral valve regurgitation   . Muscle weakness (generalized)   . Pneumonia   . Primary generalized (osteo)arthritis   . Reflux esophagitis   . Sepsis ([place]) 11/04/2018  . Unspecified hemorrhoids   . Unsteadiness on feet   . Vitamin D deficiency     Past Surgical History:  Procedure Laterality Date  . APPENDECTOMY    . CARDIAC CATHETERIZATION  1997   REVEALED MILD TO MODERATE IRREGULARITIES. HIS LEFT EVNTRICULAR SYSTOLIC FUNCTION REVEALED NORMAL EJECTION FRACTION WITH EF OF 70%  . CATARACT EXTRACTION, BILATERAL    . COLONOSCOPY  multiple  . fatty tumor removal     benign  . HEMORROIDECTOMY    . HIP ARTHROPLASTY Left 08/17/2013   Procedure: ARTHROPLASTY BIPOLAR HIP;  Surgeon: Gearlean Alf, MD;  Location: [facility];  Service: Orthopedics;  Laterality: Left;  . INGUINAL HERNIA REPAIR Bilateral 2006  . lower back surgery  2006  . NASAL SEPTUM SURGERY    . TONSILLECTOMY    . UPPER GASTROINTESTINAL ENDOSCOPY      No Known Allergies  Outpatient Encounter  Medications as of 09/18/2020  Medication Sig  . acetaminophen (TYLENOL) 500 MG tablet Take 500 mg by mouth as needed.  Marland Kitchen amLODipine (NORVASC) 5 MG tablet Take 5 mg by mouth daily.  Marland Kitchen aspirin 81 MG chewable tablet Chew 81 mg by mouth daily.  . Carboxymethylcellulose Sod PF 0.5 % SOLN Place 1 drop into both eyes daily as needed (dry eyes).   . gabapentin (NEURONTIN) 100 MG capsule Take 200 mg by mouth at bedtime.  . hydrocortisone (ANUSOL-HC) 2.5 % rectal cream Place 1 application rectally daily as needed for hemorrhoids or anal itching (USE PERINEAL APPLICATOR). With perineal applicator  . levETIRAcetam (KEPPRA) 500 MG tablet Take 1 tablet (500 mg total) by mouth 2 (two) times daily.  Marland Kitchen lisinopril (ZESTRIL) 40 MG tablet  Take 40 mg by mouth daily.  . methocarbamol (ROBAXIN) 500 MG tablet Take 250 mg by mouth as needed. Take 250 mg by mouth in the morning and an additional 250 mg once a day as needed for muscle spasms  . Multiple Vitamins-Minerals (THERA-M) TABS Take 1 tablet by mouth daily.  Marland Kitchen omeprazole (PRILOSEC) 20 MG capsule Take 20 mg by mouth daily.  . rosuvastatin (CRESTOR) 5 MG tablet Take 5 mg by mouth daily.   . traZODone (DESYREL) 50 MG tablet Take 0.5 tablets (25 mg total) by mouth at bedtime.  . vitamin B-12 (CYANOCOBALAMIN) 1000 MCG tablet Take 1,000 mcg by mouth daily.   No facility-administered encounter medications on file as of 09/18/2020.    Review of Systems:  Review of Systems  Constitutional: Positive for unexpected weight change.  HENT: Negative.   Respiratory: Negative.   Cardiovascular: Positive for leg swelling.  Gastrointestinal: Negative.   Genitourinary: Negative.   Musculoskeletal: Positive for gait problem.  Skin: Positive for color change.  Neurological: Positive for weakness.  Psychiatric/Behavioral: Negative.     Health Maintenance  Topic Date Due  . INFLUENZA VACCINE  01/14/2021  . TETANUS/TDAP  08/15/2023  . COVID-19 Vaccine  Completed  . PNA vac Low Risk Adult  Completed  . HPV VACCINES  Aged Out    Physical Exam: Vitals:   09/18/20 1423  BP: 130/62  Pulse: 68  Resp: 20  Temp: 98.1 F (36.7 C)  SpO2: 95%  Weight: 176 lb 14.4 oz (80.2 kg)  Height: 5\' 7"  (1.702 m)   Body mass index is 27.71 kg/m. Physical Exam  Constitutional: Oriented to person, place, and time. Well-developed and well-nourished.  HENT:  Head: Normocephalic.  Mouth/Throat: Oropharynx is clear and moist.  Eyes: Pupils are equal, round, and reactive to light.  Neck: Neck supple.  Cardiovascular: Normal rate and normal heart sounds.  No murmur heard. Pulmonary/Chest: Effort normal and breath sounds normal. No respiratory distress. No wheezes. Has no rales.  Abdominal: Soft.  Bowel sounds are normal. No distension. There is no tenderness. There is no rebound.  Musculoskeletal: Bilateral Edema With Left more then Right  Left leg is also Tender Red and has discharge  Lymphadenopathy: none Neurological: Alert and oriented to person, place, and time.  Skin: Skin is warm and dry.  Psychiatric: Normal mood and affect. Behavior is normal. Thought content normal.    Labs reviewed: Basic Metabolic Panel: Recent Labs    09/22/19 0333 09/23/19 0619 09/27/19 0000 01/03/20 0000 01/12/20 0257 04/06/20 0000 09/04/20 0000  NA 135 137   < > 134* 134* 137 139  K 3.9 4.3   < > 4.1 3.7 4.0 4.2  CL 101 101   < >  100 95* 104 102  CO2 23 25   < > 27*  --  25* 29*  GLUCOSE 97 107*  --   --  131*  --   --   BUN 11 18   < > 13 13 15 11   CREATININE 0.92 1.03   < > 0.9 0.90 1.0 0.9  CALCIUM 8.8* 9.0   < > 2.9*  --  8.6* 9.2  TSH  --   --   --  2.88  --  1.77  --    < > = values in this interval not displayed.   Liver Function Tests: Recent Labs    09/27/19 0000 04/06/20 0000  AST 20 16  ALT 23 16  ALKPHOS 60 66  ALBUMIN 3.4* 3.4*   No results for input(s): LIPASE, AMYLASE in the last 8760 hours. No results for input(s): AMMONIA in the last 8760 hours. CBC: Recent Labs    09/22/19 0333 09/23/19 0857 09/27/19 0000 01/03/20 0000 01/12/20 0250 01/12/20 0257  WBC 9.5 8.8 10.8 6.1 11.6*  --   NEUTROABS 5.9 5.8 7,096 3,440.00 10.0*  --   HGB 14.1 15.5 13.4* 14.0 14.1 13.9  HCT 41.8 46.2 39* 40* 40.7 41.0  MCV 95.9 96.7  --   --  93.3  --   PLT 208 260 230 188 174  --    Lipid Panel: Recent Labs    01/03/20 0000 04/06/20 0000  CHOL 120 94  HDL 51 37  LDLCALC 55 41  TRIG 60 79   No results found for: HGBA1C  Procedures since last visit: No results found.  Assessment/Plan 1. Left leg cellulitis Docycyline 100 mg BID For 7 days  2. Bilateral edema of lower extremity Lasix 40 mg QD with potassium. Patient has agreed to take it BMP in 1 week  3.  Primary hypertension Stable on Lisinopril and Norvasc  4. Coronary artery disease involving native coronary artery of native heart without angina pectoris On Aspirin.  And Statin Cannot tolerate Beta Blockers 5. Primary insomnia On Trazadone  6. Seizure ([place]) Continue Keppra 7 Primary osteoarthritis involving multiple joints Tylenol and Robaxin  Hyperlipidemia, unspecified hyperlipidemia type On Crestor  . PAF (paroxysmal atrial fibrillation) ([place]) Not started on Eliquis due to Falls and [place]  Labs/tests ordered:  BMP and CBC

## 2020-09-25 DIAGNOSIS — E876 Hypokalemia: Secondary | ICD-10-CM | POA: Diagnosis not present

## 2020-09-25 DIAGNOSIS — I10 Essential (primary) hypertension: Secondary | ICD-10-CM | POA: Diagnosis not present

## 2020-09-25 LAB — BASIC METABOLIC PANEL
BUN: 18 (ref 4–21)
CO2: 30 — AB (ref 13–22)
Chloride: 102 (ref 99–108)
Creatinine: 1 (ref 0.6–1.3)
Glucose: 76
Potassium: 4.4 (ref 3.4–5.3)
Sodium: 140 (ref 137–147)

## 2020-09-25 LAB — CBC AND DIFFERENTIAL
HCT: 42 (ref 41–53)
Hemoglobin: 14.1 (ref 13.5–17.5)
Platelets: 218 (ref 150–399)
WBC: 7.9

## 2020-09-25 LAB — CBC: RBC: 4.49 (ref 3.87–5.11)

## 2020-09-25 LAB — COMPREHENSIVE METABOLIC PANEL
GFR calc Af Amer: 77
GFR calc non Af Amer: 66

## 2020-10-08 ENCOUNTER — Encounter: Payer: Self-pay | Admitting: Nurse Practitioner

## 2020-10-08 ENCOUNTER — Non-Acute Institutional Stay (SKILLED_NURSING_FACILITY): Payer: Medicare HMO | Admitting: Nurse Practitioner

## 2020-10-08 DIAGNOSIS — I4891 Unspecified atrial fibrillation: Secondary | ICD-10-CM | POA: Diagnosis not present

## 2020-10-08 DIAGNOSIS — I10 Essential (primary) hypertension: Secondary | ICD-10-CM | POA: Diagnosis not present

## 2020-10-08 DIAGNOSIS — M8949 Other hypertrophic osteoarthropathy, multiple sites: Secondary | ICD-10-CM | POA: Diagnosis not present

## 2020-10-08 DIAGNOSIS — N401 Enlarged prostate with lower urinary tract symptoms: Secondary | ICD-10-CM

## 2020-10-08 DIAGNOSIS — R569 Unspecified convulsions: Secondary | ICD-10-CM

## 2020-10-08 DIAGNOSIS — K219 Gastro-esophageal reflux disease without esophagitis: Secondary | ICD-10-CM | POA: Diagnosis not present

## 2020-10-08 DIAGNOSIS — I2510 Atherosclerotic heart disease of native coronary artery without angina pectoris: Secondary | ICD-10-CM | POA: Diagnosis not present

## 2020-10-08 DIAGNOSIS — N138 Other obstructive and reflux uropathy: Secondary | ICD-10-CM

## 2020-10-08 DIAGNOSIS — W19XXXA Unspecified fall, initial encounter: Secondary | ICD-10-CM

## 2020-10-08 DIAGNOSIS — I5032 Chronic diastolic (congestive) heart failure: Secondary | ICD-10-CM

## 2020-10-08 DIAGNOSIS — I872 Venous insufficiency (chronic) (peripheral): Secondary | ICD-10-CM | POA: Diagnosis not present

## 2020-10-08 DIAGNOSIS — F5101 Primary insomnia: Secondary | ICD-10-CM

## 2020-10-08 DIAGNOSIS — R69 Illness, unspecified: Secondary | ICD-10-CM | POA: Diagnosis not present

## 2020-10-08 DIAGNOSIS — M159 Polyosteoarthritis, unspecified: Secondary | ICD-10-CM

## 2020-10-08 NOTE — Assessment & Plan Note (Signed)
onset 09/2019,heart rate is conrolled, not a candidate for anticoagulation,  

## 2020-10-08 NOTE — Assessment & Plan Note (Signed)
takes Omeprazole 20mg  qd. Hgb 14.1 09/27/20

## 2020-10-08 NOTE — Assessment & Plan Note (Signed)
BLE brownish pigmentation,mild erythematous shins.09/18/20 Dr. Lyndel Safe started Furosemide 40mg  qd/Kcl qd, the patient requested to stop 10/05/20  Treated left leg cellulitis with Doxycyline bid x 7 days since 09/18/20  At baseline mild erythema and swelling BLE

## 2020-10-08 NOTE — Assessment & Plan Note (Signed)
In setting of pulse 40s, will obtain EKG, update CBC/diff, CMP/eGFR. Will change Trazodone to prn to eliminate possible contributory factor to falling, the patient is sleeping/rest okay presently. Will dc Methocarbamol prn-not used, HPOA requested to medication review.  Recurrent falls, due to unsteady on his legs, attempting standing up or transfer with no assistance. close supervision/assistance for safety.

## 2020-10-08 NOTE — Assessment & Plan Note (Signed)
blood pressure is controled on amlodipine 5mg qd, Lisinopril 40mg qd.   

## 2020-10-08 NOTE — Assessment & Plan Note (Signed)
takes Keppra 500mg  bid. Last seen by neurology 05/09/20

## 2020-10-08 NOTE — Assessment & Plan Note (Signed)
EF 79-48%, grade 1 diastolic dysfunction,offFurosemide 40mg  qd, saw Cardiology 02/06/20. Bun/creat 18/0.98 09/27/20

## 2020-10-08 NOTE — Progress Notes (Signed)
Location:   [facility]   Place of Service:    Provider: [facility] Ardice Boyan NP  [name], MD  Patient Care Team: [name], MD as PCP - General (Internal Medicine) Gatha Mayer, MD as Consulting Physician (Gastroenterology) [name], [name], MD as Consulting Physician (Cardiology) [name], MD as Attending Physician (Internal Medicine) Edman Lipsey X, NP as Nurse Practitioner (Internal Medicine)  Extended Emergency Contact Information Primary Emergency Contact: Collignon,[name] Address: [place], [place] [address] [name] of [place] Phone: [phone] Mobile Phone: [phone] Relation: Daughter Secondary Emergency Contact: Mcdade,[name] Address: [place] [address] [name] of [place] Phone: [phone] Mobile Phone: [phone] Relation: Son  Code Status: DNR Goals of care: Advanced Directive information Advanced Directives 10/08/2020  Does Patient Have a Medical Advance Directive? Yes  Type of Paramedic of [place];Living will  Does patient want to make changes to medical advance directive? No - Patient declined  Copy of [place] in Chart? Yes - validated most recent copy scanned in chart (See row information)  Pre-existing out of facility DNR order (yellow form or pink MOST form) -     Chief Complaint  Patient presents with  . Acute Visit    Fall    HPI:  Pt is a 87 y.o. male seen today for an acute visit for fall 10/05/20, unwitnessed, in bathroom after a large BM, reported slow pulse 45 at onset of fall, resolved to 60bpm. Denied chest pain/palpitaiton, SOB, weakness in limbs, slurred speech, LOC, or abd pain. HPOA requested medication review.      Recurrent falls, due to unsteady on his legs, attempting standing up or transfer with no assistance.              BPH, saw urology 08/02/20 PVD, BLE brownish  pigmentation,mild erythematous shins.09/18/20 Dr. Lyndel Safe started Furosemide 33m qd/Kcl qd, the patient requested to stop 10/05/20  Treated left leg cellulitis with Doxycyline bid x 7 days since 09/18/20 Afib,onset 09/2019,heart rate is conrolled, not a candidate for anticoagulation,  CHF, EF 684-66% grade 1 diastolic dysfunction,offFurosemide 434mqd, saw Cardiology 02/06/20. Bun/creat 18/0.98 09/27/20 HTN, blood pressure is controled on amlodipine 43m743md, Lisinopril 18m68m.  Hx of CAD, hyperlipidemia, on Crestor, refused ASA. LDL 41 04/06/20 GERD, takes Omeprazole 20mg44m Hgb 14.1 09/27/20 Insomnia, stable, on Trazodone Chronic lower back pain/right hip pain, on Gabapentin 200mg 48mA, s/p left hip surgery, chronic right hip pain, w/c helps, prn Tylenol, Methocarbamol prn, not used.  Seizure: takes Keppra 500mg b97mLast seen by neurology 05/09/20  Past Medical History:  Diagnosis Date  . Allergic rhinitis   . Amnesia   . Atherosclerotic heart disease of native coronary artery without angina pectoris   . BPH (benign prostatic hyperplasia)   . Bradycardia   . Chronic diastolic (congestive) heart failure (HCC)   [place]   . Coronary artery disease    MODERATE  . Dizziness   . Edema of lower extremity   . Erectile dysfunction   . H. pylori infection    Hx of   . Hard of hearing   . Headache(784.0)   . History of colon polyps 2009   Dr. Orr/Colonoscopy -small cecal adenoma  . History of falling   . Hx of colonoscopy 2009   Dr Orr, heLajoyce Cornersrhoids & polyps  .  Hyperlipidemia   . Hypertension   . Hypertensive heart disease with heart failure ([place])   . Hypokalemia   . Internal hemorrhoids 2009   Dr. Lajoyce Corners Colonoscopy  . Metabolic encephalopathy   . Mitral valve regurgitation   . Muscle weakness (generalized)   . Pneumonia   . Primary generalized (osteo)arthritis   .  Reflux esophagitis   . Sepsis ([place]) 11/04/2018  . Unspecified hemorrhoids   . Unsteadiness on feet   . Vitamin D deficiency    Past Surgical History:  Procedure Laterality Date  . APPENDECTOMY    . CARDIAC CATHETERIZATION  1997   REVEALED MILD TO MODERATE IRREGULARITIES. HIS LEFT EVNTRICULAR SYSTOLIC FUNCTION REVEALED NORMAL EJECTION FRACTION WITH EF OF 70%  . CATARACT EXTRACTION, BILATERAL    . COLONOSCOPY  multiple  . fatty tumor removal     benign  . HEMORROIDECTOMY    . HIP ARTHROPLASTY Left 08/17/2013   Procedure: ARTHROPLASTY BIPOLAR HIP;  Surgeon: Gearlean Alf, MD;  Location: [facility];  Service: Orthopedics;  Laterality: Left;  . INGUINAL HERNIA REPAIR Bilateral 2006  . lower back surgery  2006  . NASAL SEPTUM SURGERY    . TONSILLECTOMY    . UPPER GASTROINTESTINAL ENDOSCOPY      No Known Allergies  Allergies as of 10/08/2020   No Known Allergies     Medication List       Accurate as of October 08, 2020 11:59 PM. If you have any questions, ask your nurse or doctor.        acetaminophen 500 MG tablet Commonly known as: TYLENOL Take 500 mg by mouth as needed.   amLODipine 5 MG tablet Commonly known as: NORVASC Take 5 mg by mouth daily.   aspirin 81 MG chewable tablet Chew 81 mg by mouth daily.   Carboxymethylcellulose Sod PF 0.5 % Soln Place 1 drop into both eyes daily as needed (dry eyes).   gabapentin 100 MG capsule Commonly known as: NEURONTIN Take 200 mg by mouth at bedtime.   hydrocortisone 2.5 % rectal cream Commonly known as: ANUSOL-HC Place 1 application rectally daily as needed for hemorrhoids or anal itching (USE PERINEAL APPLICATOR). With perineal applicator   levETIRAcetam 500 MG tablet Commonly known as: KEPPRA Take 1 tablet (500 mg total) by mouth 2 (two) times daily.   lisinopril 40 MG tablet Commonly known as: ZESTRIL Take 40 mg by mouth daily.   methocarbamol 500 MG tablet Commonly known as: ROBAXIN Take 250 mg by mouth as needed.  Take 250 mg by mouth in the morning and an additional 250 mg once a day as needed for muscle spasms   omeprazole 20 MG capsule Commonly known as: PRILOSEC Take 20 mg by mouth daily.   potassium chloride 10 MEQ tablet Commonly known as: KLOR-CON Take 10 mEq by mouth 2 (two) times daily.   rosuvastatin 5 MG tablet Commonly known as: CRESTOR Take 5 mg by mouth daily.   Thera-M Tabs Take 1 tablet by mouth daily.   traZODone 50 MG tablet Commonly known as: DESYREL Take 0.5 tablets (25 mg total) by mouth at bedtime.   vitamin B-12 1000 MCG tablet Commonly known as: CYANOCOBALAMIN Take 1,000 mcg by mouth daily.       Review of Systems  Constitutional: Negative for activity change, appetite change and fever.  HENT: Positive for hearing loss. Negative for congestion and voice change.   Eyes: Negative for visual disturbance.  Respiratory: Negative for cough and shortness of breath.  Cardiovascular: Positive for leg swelling.  Gastrointestinal: Negative for abdominal pain and constipation.  Genitourinary: Positive for frequency. Negative for difficulty urinating and urgency.  Musculoskeletal: Positive for arthralgias, back pain and gait problem.       Lower back, right hip/thigh pain   Skin: Negative for color change.  Neurological: Negative for seizures, weakness and headaches.       Memory lapses. Tingling in toes.   Psychiatric/Behavioral: Negative for behavioral problems and sleep disturbance. The patient is not nervous/anxious.     Immunization History  Administered Date(s) Administered  . Influenza, High Dose Seasonal PF 03/19/2019, 03/19/2019  . Influenza-Unspecified 04/08/2016, 03/27/2020  . Moderna Sars-Covid-2 Vaccination 06/18/2019, 07/16/2019, 04/24/2020  . Pneumococcal Conjugate-13 06/02/2013  . Pneumococcal Polysaccharide-23 04/07/2001   Pertinent  Health Maintenance Due  Topic Date Due  . INFLUENZA VACCINE  01/14/2021  . PNA vac Low Risk Adult  Completed    Fall Risk  11/03/2017 07/11/2016  Falls in the past year? Yes Yes  Number falls in past yr: 2 or more 2 or more  Injury with Fall? Yes Yes  Comment - broke toe on right foot  Risk Factor Category  High Fall Risk -  Follow up Education provided;Falls prevention discussed -   Functional Status Survey:    Vitals:   10/08/20 1500  BP: (!) 141/77  Pulse: 77  Resp: 18  Temp: 98 F (36.7 C)  SpO2: 96%  Weight: 176 lb 14.4 oz (80.2 kg)  Height: 5' 7"  (1.702 m)   Body mass index is 27.71 kg/m. Physical Exam Vitals and nursing note reviewed.  Constitutional:      Appearance: Normal appearance.  HENT:     Head: Normocephalic and atraumatic.     Mouth/Throat:     Mouth: Mucous membranes are moist.  Eyes:     Conjunctiva/sclera: Conjunctivae normal.     Pupils: Pupils are equal, round, and reactive to light.  Cardiovascular:     Rate and Rhythm: Normal rate and regular rhythm.     Heart sounds: Murmur heard.    Pulmonary:     Breath sounds: No rales.  Abdominal:     General: Bowel sounds are normal.     Palpations: Abdomen is soft.     Tenderness: There is no abdominal tenderness.  Genitourinary:    Comments: External hemorrhoids, no bleeding or injury.  Musculoskeletal:     Cervical back: Normal range of motion and neck supple. No muscular tenderness.     Right lower leg: Edema present.     Left lower leg: Edema present.     Comments: 1+ edema BLE. W/c for mobility.   Skin:    General: Skin is warm and dry.     Comments: Chronic venous insufficiency skin changes BLE  Neurological:     General: No focal deficit present.     Mental Status: He is alert. Mental status is at baseline.     Gait: Gait abnormal.     Comments: Oriented to person, place.   Psychiatric:        Mood and Affect: Mood normal.        Behavior: Behavior normal.        Thought Content: Thought content normal.     Labs reviewed: Recent Labs    01/03/20 0000 01/12/20 0257 04/06/20 0000  09/04/20 0000  NA 134* 134* 137 139  K 4.1 3.7 4.0 4.2  CL 100 95* 104 102  CO2 27*  --  25* 29*  GLUCOSE  --  131*  --   --   BUN 13 13 15 11   CREATININE 0.9 0.90 1.0 0.9  CALCIUM 2.9*  --  8.6* 9.2   Recent Labs    04/06/20 0000  AST 16  ALT 16  ALKPHOS 66  ALBUMIN 3.4*   Recent Labs    01/03/20 0000 01/12/20 0250 01/12/20 0257  WBC 6.1 11.6*  --   NEUTROABS 3,440.00 10.0*  --   HGB 14.0 14.1 13.9  HCT 40* 40.7 41.0  MCV  --  93.3  --   PLT 188 174  --    Lab Results  Component Value Date   TSH 1.77 04/06/2020   No results found for: HGBA1C Lab Results  Component Value Date   CHOL 94 04/06/2020   HDL 37 04/06/2020   LDLCALC 41 04/06/2020   TRIG 79 04/06/2020   CHOLHDL 2.7 02/17/2017    Significant Diagnostic Results in last 30 days:  No results found.  Assessment/Plan: Fall In setting of pulse 40s, will obtain EKG, update CBC/diff, CMP/eGFR. Will change Trazodone to prn to eliminate possible contributory factor to falling, the patient is sleeping/rest okay presently. Will dc Methocarbamol prn-not used, HPOA requested to medication review.  Recurrent falls, due to unsteady on his legs, attempting standing up or transfer with no assistance. close supervision/assistance for safety.    Insomnia Sleeps, rest okay at night, will change Trazodone to prn.   BPH (benign prostatic hyperplasia) saw urology 08/02/20   Venous insufficiency (chronic) (peripheral)  BLE brownish pigmentation,mild erythematous shins.09/18/20 Dr. Lyndel Safe started Furosemide 56m qd/Kcl qd, the patient requested to stop 10/05/20  Treated left leg cellulitis with Doxycyline bid x 7 days since 09/18/20  At baseline mild erythema and swelling BLE   [place] 09[place],heart rate is conrolled, not a candidate for anticoagulation,    Chronic diastolic heart failure (HCC) EF 680-22% grade 1 diastolic dysfunction,offFurosemide 44mqd, saw Cardiology 02/06/20.  Bun/creat 18/0.98 09/27/20  Hypertension blood pressure is controled on amlodipine 26m88md, Lisinopril 71m61m.   CAD (coronary artery disease) Hx of CAD, hyperlipidemia, on Crestor, refused ASA. LDL 41 04/06/20   GERD (gastroesophageal reflux disease)  takes Omeprazole 20mg67m Hgb 14.1 09/27/20   Osteoarthritis, multiple sites Chronic lower back pain/right hip pain, on Gabapentin 200mg 7mOA, s/p left hip surgery, chronic right hip pain, w/c helps, prn Tylenol, Methocarbamol prn, not used.   Seizure (HCC) [place] Keppra 500mg b34mLast seen by neurology 05/09/20    Family/ staff Communication: plan of care reviewed with the patient and charge nurse.   Labs/tests ordered:  None  Time spend 35 minutes.

## 2020-10-08 NOTE — Assessment & Plan Note (Signed)
Hx of CAD, hyperlipidemia, on Crestor, refused ASA. LDL 41 04/06/20 

## 2020-10-08 NOTE — Assessment & Plan Note (Signed)
Sleeps, rest okay at night, will change Trazodone to prn.

## 2020-10-08 NOTE — Assessment & Plan Note (Signed)
saw urology 08/02/20

## 2020-10-08 NOTE — Assessment & Plan Note (Signed)
Chronic lower back pain/right hip pain, on Gabapentin 200mg  qd. OA, s/p left hip surgery, chronic right hip pain, w/c helps, prn Tylenol, Methocarbamol prn, not used.

## 2020-10-09 ENCOUNTER — Encounter: Payer: Self-pay | Admitting: Nurse Practitioner

## 2020-10-09 DIAGNOSIS — I10 Essential (primary) hypertension: Secondary | ICD-10-CM | POA: Diagnosis not present

## 2020-10-09 DIAGNOSIS — E876 Hypokalemia: Secondary | ICD-10-CM | POA: Diagnosis not present

## 2020-10-09 LAB — BASIC METABOLIC PANEL
BUN: 15 (ref 4–21)
CO2: 28 — AB (ref 13–22)
Chloride: 104 (ref 99–108)
Creatinine: 1.1 (ref 0.6–1.3)
Glucose: 73
Potassium: 4.6 (ref 3.4–5.3)
Sodium: 139 (ref 137–147)

## 2020-10-09 LAB — CBC: RBC: 4.13 (ref 3.87–5.11)

## 2020-10-09 LAB — HEPATIC FUNCTION PANEL
ALT: 14 (ref 10–40)
AST: 16 (ref 14–40)
Alkaline Phosphatase: 54 (ref 25–125)
Bilirubin, Total: 0.7

## 2020-10-09 LAB — CBC AND DIFFERENTIAL
HCT: 39 — AB (ref 41–53)
Hemoglobin: 13.5 (ref 13.5–17.5)
Neutrophils Absolute: 5963
Platelets: 236 (ref 150–399)
WBC: 8.9

## 2020-10-09 LAB — COMPREHENSIVE METABOLIC PANEL
Albumin: 3.4 — AB (ref 3.5–5.0)
Calcium: 8.8 (ref 8.7–10.7)
GFR calc Af Amer: 70
GFR calc non Af Amer: 60
Globulin: 2

## 2020-10-12 ENCOUNTER — Non-Acute Institutional Stay (SKILLED_NURSING_FACILITY): Payer: Medicare HMO | Admitting: Nurse Practitioner

## 2020-10-12 ENCOUNTER — Encounter: Payer: Self-pay | Admitting: Nurse Practitioner

## 2020-10-12 DIAGNOSIS — W19XXXA Unspecified fall, initial encounter: Secondary | ICD-10-CM | POA: Diagnosis not present

## 2020-10-12 DIAGNOSIS — I10 Essential (primary) hypertension: Secondary | ICD-10-CM | POA: Diagnosis not present

## 2020-10-12 DIAGNOSIS — I4891 Unspecified atrial fibrillation: Secondary | ICD-10-CM | POA: Diagnosis not present

## 2020-10-12 DIAGNOSIS — I2510 Atherosclerotic heart disease of native coronary artery without angina pectoris: Secondary | ICD-10-CM | POA: Diagnosis not present

## 2020-10-12 DIAGNOSIS — N401 Enlarged prostate with lower urinary tract symptoms: Secondary | ICD-10-CM | POA: Diagnosis not present

## 2020-10-12 DIAGNOSIS — M159 Polyosteoarthritis, unspecified: Secondary | ICD-10-CM

## 2020-10-12 DIAGNOSIS — I872 Venous insufficiency (chronic) (peripheral): Secondary | ICD-10-CM | POA: Diagnosis not present

## 2020-10-12 DIAGNOSIS — N138 Other obstructive and reflux uropathy: Secondary | ICD-10-CM

## 2020-10-12 DIAGNOSIS — I5032 Chronic diastolic (congestive) heart failure: Secondary | ICD-10-CM | POA: Diagnosis not present

## 2020-10-12 DIAGNOSIS — K219 Gastro-esophageal reflux disease without esophagitis: Secondary | ICD-10-CM | POA: Diagnosis not present

## 2020-10-12 DIAGNOSIS — R69 Illness, unspecified: Secondary | ICD-10-CM | POA: Diagnosis not present

## 2020-10-12 DIAGNOSIS — M8949 Other hypertrophic osteoarthropathy, multiple sites: Secondary | ICD-10-CM

## 2020-10-12 DIAGNOSIS — R569 Unspecified convulsions: Secondary | ICD-10-CM

## 2020-10-12 DIAGNOSIS — F5101 Primary insomnia: Secondary | ICD-10-CM

## 2020-10-12 DIAGNOSIS — L03116 Cellulitis of left lower limb: Secondary | ICD-10-CM | POA: Insufficient documentation

## 2020-10-12 NOTE — Assessment & Plan Note (Addendum)
c/o left lower leg pain, more in his calf, open areas in the calf and shin with yellow drainage, 2/3 of the left lower leg redness, warmth, swelling.  Venous US LLE to r/o DVT Doxycycline 100mg  bid x 7days, FloraStor bid x7 days, apply Bactroban oint bid to the open areas LLE bid until healed.  Treated left leg cellulitis with Doxycyline bid x 7 days since 09/18/20

## 2020-10-12 NOTE — Assessment & Plan Note (Addendum)
Hx of CAD, hyperlipidemia, on Crestor,  ASA. LDL 41 04/06/20. 10/09/20 EKG SR vent rate 65, no significant ST-T changes.

## 2020-10-12 NOTE — Assessment & Plan Note (Signed)
CHF, EF 26-37%, grade 1 diastolic dysfunction,offFurosemide 40mg  qd, saw cardiology

## 2020-10-12 NOTE — Assessment & Plan Note (Signed)
Chronic lower back pain/right hip pain,  on Gabapentin 200mg qd. OA, s/p left hip surgery, chronic right hip pain, w/c helps, prn Tylenol 

## 2020-10-12 NOTE — Assessment & Plan Note (Signed)
PVD, BLE brownish pigmentation,mild erythematous shins.09/18/20 Dr. Lyndel Safe started Furosemide 40mg  qd/Kcl qd, the patient requested to stop 10/05/20

## 2020-10-12 NOTE — Assessment & Plan Note (Signed)
Stable,  takes Omeprazole 20mg  qd.

## 2020-10-12 NOTE — Assessment & Plan Note (Signed)
Seizure: takes Keppra 500mg bid. Last seen by neurology 05/09/20              

## 2020-10-12 NOTE — Progress Notes (Signed)
Location:   [facility] [place] Room Number: 74 Place of Service:  SNF (31) Provider: [facility] Kapono Luhn NP  [name], MD  Patient Care Team: [name], MD as PCP - General (Internal Medicine) Gatha Mayer, MD as Consulting Physician (Gastroenterology) [name], [name], MD as Consulting Physician (Cardiology) [name], MD as Attending Physician (Internal Medicine) Amman Bartel X, NP as Nurse Practitioner (Internal Medicine)  Extended Emergency Contact Information Primary Emergency Contact: Ligas,[name] Address: [place], [place] [address] [name] of [place] Phone: [phone] Mobile Phone: [phone] Relation: Daughter Secondary Emergency Contact: Neaves,[name] Address: [place] [address] [name] of [place] Phone: [phone] Mobile Phone: [phone] Relation: Son  Code Status: DNR Goals of care: Advanced Directive information Advanced Directives 10/08/2020  Does Patient Have a Medical Advance Directive? Yes  Type of Paramedic of [place];Living will  Does patient want to make changes to medical advance directive? No - Patient declined  Copy of [place] in Chart? Yes - validated most recent copy scanned in chart (See row information)  Pre-existing out of facility DNR order (yellow form or pink MOST form) -     Chief Complaint  Patient presents with  . Acute Visit    C/o left lower leg pain    HPI:  Pt is a 87 y.o. male seen today for an acute visit for c/o left lower leg pain, more in his calf, open areas in the calf and shin with yellow drainage, 2/3 of the left lower leg redness, warmth, swelling.   Recurrent falls, due to unsteady on his legs, attempting standing up or transfer with no assistance. BPH, saw urology 08/02/20 PVD, BLE brownish pigmentation,mild erythematous shins.09/18/20 Dr.  Lyndel Safe started Furosemide 40mg  qd/Kcl qd, the patient requested to stop 10/05/20             Treated left leg cellulitis with Doxycyline bid x 7 days since 09/18/20 Afib,onset 09/2019,heart rate is conrolled, not a candidate for anticoagulation,  CHF, EF [id number], grade 1 diastolic dysfunction,offFurosemide 40mg  qd, saw Cardiology  HTN, blood pressure is controled on amlodipine 5mg  qd, Lisinopril 40mg  qd.  Hx of CAD, hyperlipidemia, on Crestor, ASA. LDL 41 04/06/20. 10/09/20 EKG SR vent rate 65, no significant ST-T changes.  GERD, takes Omeprazole 20mg  qd.  Insomnia, stable, on Trazodone Chronic lower back pain/right hip pain, on Gabapentin 200mg  qd OA, s/p left hip surgery, chronic right hip pain, w/c helps, prn Tylenol Seizure: takes Keppra 500mg  bid. Last seen by neurology 05/09/20      Past Medical History:      Past Medical History:  Diagnosis Date  . Allergic rhinitis   . Amnesia   . Atherosclerotic heart disease of native coronary artery without angina pectoris   . BPH (benign prostatic hyperplasia)   . Bradycardia   . Chronic diastolic (congestive) heart failure ([place])   . Colon polyps   . Coronary artery disease    MODERATE  . Dizziness   . Edema of lower extremity   . Erectile dysfunction   . H. pylori infection    Hx of   . Hard of hearing   . Headache(784.0)   . History of colon polyps 2009   Dr. Orr/Colonoscopy -small cecal adenoma  . History of falling   . Hx of colonoscopy 2009   Dr  Lajoyce Corners, hemorrhoids & polyps  . Hyperlipidemia   . Hypertension   . Hypertensive heart disease with heart failure ([place])   . Hypokalemia   . Internal hemorrhoids 2009   Dr. Lajoyce Corners Colonoscopy  . Metabolic encephalopathy   . Mitral valve regurgitation   . Muscle weakness (generalized)   . Pneumonia   . Primary generalized (osteo)arthritis   . Reflux esophagitis   . Sepsis  ([place]) 11/04/2018  . Unspecified hemorrhoids   . Unsteadiness on feet   . Vitamin D deficiency    Past Surgical History:  Procedure Laterality Date  . APPENDECTOMY    . CARDIAC CATHETERIZATION  1997   REVEALED MILD TO MODERATE IRREGULARITIES. HIS LEFT EVNTRICULAR SYSTOLIC FUNCTION REVEALED NORMAL EJECTION FRACTION WITH EF OF 70%  . CATARACT EXTRACTION, BILATERAL    . COLONOSCOPY  multiple  . fatty tumor removal     benign  . HEMORROIDECTOMY    . HIP ARTHROPLASTY Left 08/17/2013   Procedure: ARTHROPLASTY BIPOLAR HIP;  Surgeon: Gearlean Alf, MD;  Location: [facility];  Service: Orthopedics;  Laterality: Left;  . INGUINAL HERNIA REPAIR Bilateral 2006  . lower back surgery  2006  . NASAL SEPTUM SURGERY    . TONSILLECTOMY    . UPPER GASTROINTESTINAL ENDOSCOPY      No Known Allergies  Allergies as of 10/12/2020   No Known Allergies     Medication List       Accurate as of October 12, 2020  2:03 PM. If you have any questions, ask your nurse or doctor.        STOP taking these medications   potassium chloride 10 MEQ tablet Commonly known as: KLOR-CON Stopped by: Leza Apsey X Roslind Michaux, NP     TAKE these medications   acetaminophen 500 MG tablet Commonly known as: TYLENOL Take 500 mg by mouth as needed.   amLODipine 5 MG tablet Commonly known as: NORVASC Take 5 mg by mouth daily.   aspirin 81 MG chewable tablet Chew 81 mg by mouth daily.   Carboxymethylcellulose Sod PF 0.5 % Soln Place 1 drop into both eyes daily as needed (dry eyes).   gabapentin 100 MG capsule Commonly known as: NEURONTIN Take 200 mg by mouth at bedtime.   hydrocortisone 2.5 % rectal cream Commonly known as: ANUSOL-HC Place 1 application rectally daily as needed for hemorrhoids or anal itching (USE PERINEAL APPLICATOR). With perineal applicator   levETIRAcetam 500 MG tablet Commonly known as: KEPPRA Take 1 tablet (500 mg total) by mouth 2 (two) times daily.   lisinopril 40 MG tablet Commonly known as:  ZESTRIL Take 40 mg by mouth daily.   methocarbamol 500 MG tablet Commonly known as: ROBAXIN Take 250 mg by mouth as needed. Take 250 mg by mouth in the morning and an additional 250 mg once a day as needed for muscle spasms   omeprazole 20 MG capsule Commonly known as: PRILOSEC Take 20 mg by mouth daily.   rosuvastatin 5 MG tablet Commonly known as: CRESTOR Take 5 mg by mouth daily.   Thera-M Tabs Take 1 tablet by mouth daily.   traZODone 50 MG tablet Commonly known as: DESYREL Take 0.5 tablets (25 mg total) by mouth at bedtime.   vitamin B-12 1000 MCG tablet Commonly known as: CYANOCOBALAMIN Take 1,000 mcg by mouth daily.       Review of Systems  Constitutional: Negative for activity change, appetite change and fever.  HENT: Positive for hearing loss. Negative for congestion and voice change.  Eyes: Negative for visual disturbance.  Respiratory: Negative for cough and shortness of breath.   Cardiovascular: Positive for leg swelling.  Gastrointestinal: Negative for abdominal pain and constipation.  Genitourinary: Positive for frequency. Negative for difficulty urinating and urgency.  Musculoskeletal: Positive for arthralgias, back pain and gait problem.       Lower back, right hip/thigh pain   Skin: Positive for wound. Negative for color change.  Neurological: Negative for seizures, weakness and headaches.       Memory lapses. Tingling in toes.   Psychiatric/Behavioral: Negative for behavioral problems and sleep disturbance. The patient is not nervous/anxious.     Immunization History  Administered Date(s) Administered  . Influenza, High Dose Seasonal PF 03/19/2019, 03/19/2019  . Influenza-Unspecified 04/08/2016, 03/27/2020  . Moderna Sars-Covid-2 Vaccination 06/18/2019, 07/16/2019, 04/24/2020  . Pneumococcal Conjugate-13 06/02/2013  . Pneumococcal Polysaccharide-23 04/07/2001   Pertinent  Health Maintenance Due  Topic Date Due  . INFLUENZA VACCINE  01/14/2021   . PNA vac Low Risk Adult  Completed   Fall Risk  11/03/2017 07/11/2016  Falls in the past year? Yes Yes  Number falls in past yr: 2 or more 2 or more  Injury with Fall? Yes Yes  Comment - broke toe on right foot  Risk Factor Category  High Fall Risk -  Follow up Education provided;Falls prevention discussed -   Functional Status Survey:    Vitals:   10/12/20 1148  BP: (!) 142/90  Pulse: 77  Resp: 18  Temp: 98.2 F (36.8 C)  SpO2: 97%   There is no height or weight on file to calculate BMI. Physical Exam Vitals and nursing note reviewed.  Constitutional:      Appearance: Normal appearance.  HENT:     Head: Normocephalic and atraumatic.     Mouth/Throat:     Mouth: Mucous membranes are moist.  Eyes:     Conjunctiva/sclera: Conjunctivae normal.     Pupils: Pupils are equal, round, and reactive to light.  Cardiovascular:     Rate and Rhythm: Normal rate and regular rhythm.     Heart sounds: Murmur heard.    Pulmonary:     Breath sounds: No rales.  Abdominal:     General: Bowel sounds are normal.     Palpations: Abdomen is soft.     Tenderness: There is no abdominal tenderness.  Genitourinary:    Comments: External hemorrhoids, no bleeding or injury.  Musculoskeletal:     Cervical back: Normal range of motion and neck supple. No muscular tenderness.     Right lower leg: Edema present.     Left lower leg: Edema present.     Comments: 1+ edema BLE. W/c for mobility.   Skin:    General: Skin is warm and dry.     Findings: Erythema present.     Comments: Chronic venous insufficiency skin changes BLE. Left lower leg open areas with yellow drainage shin and calf, 2/3 of the left lower leg redness, warmth, tenderness, and swelling.   Neurological:     General: No focal deficit present.     Mental Status: He is alert. Mental status is at baseline.     Gait: Gait abnormal.     Comments: Oriented to person, place.   Psychiatric:        Mood and Affect: Mood normal.         Behavior: Behavior normal.        Thought Content: Thought content normal.     Labs reviewed:  Recent Labs    01/03/20 0000 01/12/20 0257 04/06/20 0000 09/04/20 0000  NA 134* 134* 137 139  K 4.1 3.7 4.0 4.2  CL 100 95* 104 102  CO2 27*  --  25* 29*  GLUCOSE  --  131*  --   --   BUN 13 13 15 11   CREATININE 0.9 0.90 1.0 0.9  CALCIUM 2.9*  --  8.6* 9.2   Recent Labs    04/06/20 0000  AST 16  ALT 16  ALKPHOS 66  ALBUMIN 3.4*   Recent Labs    01/03/20 0000 01/12/20 0250 01/12/20 0257  WBC 6.1 11.6*  --   NEUTROABS 3,440.00 10.0*  --   HGB 14.0 14.1 13.9  HCT 40* 40.7 41.0  MCV  --  93.3  --   PLT 188 174  --    Lab Results  Component Value Date   TSH 1.77 04/06/2020   No results found for: HGBA1C Lab Results  Component Value Date   CHOL 94 04/06/2020   HDL 37 04/06/2020   LDLCALC 41 04/06/2020   TRIG 79 04/06/2020   CHOLHDL 2.7 02/17/2017    Significant Diagnostic Results in last 30 days:  No results found.  Assessment/Plan: Cellulitis of left lower leg c/o left lower leg pain, more in his calf, open areas in the calf and shin with yellow drainage, 2/3 of the left lower leg redness, warmth, swelling.  Venous US LLE to r/o DVT Doxycycline 100mg  bid x 7days, FloraStor bid x7 days, apply Bactroban oint bid to the open areas LLE bid until healed.  Treated left leg cellulitis with Doxycyline bid x 7 days since 09/18/20   Venous insufficiency (chronic) (peripheral) PVD, BLE brownish pigmentation,mild erythematous shins.09/18/20 Dr. Lyndel Safe started Furosemide 40mg  qd/Kcl qd, the patient requested to stop 10/05/20   Fall Recurrent falls, due to unsteady on his legs, attempting standing up or transfer with no assistance.   BPH (benign prostatic hyperplasia) BPH, saw urology 08/02/20, chronic urinary frequency   New onset atrial fibrillation ([place]) Afib,onset 09/2019,heart rate is conrolled, not a candidate for anticoagulation,   Chronic  diastolic heart failure (H[facility]) CHF, EF 01-02%, grade 1 diastolic dysfunction,offFurosemide 40mg  qd, saw cardiology  Hypertension blood pressure is controled on amlodipine 5mg  qd, Lisinopril 40mg  qd.    CAD (coronary artery disease) Hx of CAD, hyperlipidemia, on Crestor,  ASA. LDL 41 04/06/20. 10/09/20 EKG SR vent rate 65, no significant ST-T changes.    GERD (gastroesophageal reflux disease) Stable,  takes Omeprazole 20mg  qd.   Insomnia  stable, on Trazodone   Osteoarthritis, multiple sites Chronic lower back pain/right hip pain, on Gabapentin 200mg  qd. OA, s/p left hip surgery, chronic right hip pain, w/c helps, prn Tylenol   Seizure ([place]) Seizure: takes Keppra 500mg  bid. Last seen by neurology 05/09/20    Family/ staff Communication: plan of care reviewed with the patient and charge nurse.   Labs/tests ordered:  Venous US LLE  Time spend 35 minutes.

## 2020-10-12 NOTE — Assessment & Plan Note (Addendum)
blood pressure is controled on amlodipine 5mg  qd, Lisinopril 40mg  qd.

## 2020-10-12 NOTE — Assessment & Plan Note (Signed)
BPH, saw urology 08/02/20, chronic urinary frequency.  

## 2020-10-12 NOTE — Assessment & Plan Note (Signed)
Recurrent falls, due to unsteady on his legs, attempting standing up or transfer with no assistance.  

## 2020-10-12 NOTE — Assessment & Plan Note (Signed)
stable, on Trazodone

## 2020-10-12 NOTE — Assessment & Plan Note (Signed)
Afib, onset 09/2019, heart rate is conrolled, not a candidate for anticoagulation,   

## 2020-10-15 DIAGNOSIS — M7989 Other specified soft tissue disorders: Secondary | ICD-10-CM | POA: Diagnosis not present

## 2020-10-26 ENCOUNTER — Non-Acute Institutional Stay (SKILLED_NURSING_FACILITY): Payer: Medicare HMO | Admitting: Nurse Practitioner

## 2020-10-26 ENCOUNTER — Encounter: Payer: Self-pay | Admitting: Nurse Practitioner

## 2020-10-26 DIAGNOSIS — I10 Essential (primary) hypertension: Secondary | ICD-10-CM

## 2020-10-26 DIAGNOSIS — K219 Gastro-esophageal reflux disease without esophagitis: Secondary | ICD-10-CM

## 2020-10-26 DIAGNOSIS — N401 Enlarged prostate with lower urinary tract symptoms: Secondary | ICD-10-CM

## 2020-10-26 DIAGNOSIS — L03116 Cellulitis of left lower limb: Secondary | ICD-10-CM | POA: Diagnosis not present

## 2020-10-26 DIAGNOSIS — I5032 Chronic diastolic (congestive) heart failure: Secondary | ICD-10-CM

## 2020-10-26 DIAGNOSIS — M8949 Other hypertrophic osteoarthropathy, multiple sites: Secondary | ICD-10-CM

## 2020-10-26 DIAGNOSIS — N138 Other obstructive and reflux uropathy: Secondary | ICD-10-CM

## 2020-10-26 DIAGNOSIS — R569 Unspecified convulsions: Secondary | ICD-10-CM

## 2020-10-26 DIAGNOSIS — I4891 Unspecified atrial fibrillation: Secondary | ICD-10-CM

## 2020-10-26 DIAGNOSIS — I872 Venous insufficiency (chronic) (peripheral): Secondary | ICD-10-CM | POA: Diagnosis not present

## 2020-10-26 DIAGNOSIS — I2510 Atherosclerotic heart disease of native coronary artery without angina pectoris: Secondary | ICD-10-CM

## 2020-10-26 DIAGNOSIS — F5101 Primary insomnia: Secondary | ICD-10-CM

## 2020-10-26 DIAGNOSIS — M159 Polyosteoarthritis, unspecified: Secondary | ICD-10-CM

## 2020-10-26 DIAGNOSIS — R296 Repeated falls: Secondary | ICD-10-CM

## 2020-10-26 DIAGNOSIS — R69 Illness, unspecified: Secondary | ICD-10-CM | POA: Diagnosis not present

## 2020-10-26 NOTE — Assessment & Plan Note (Signed)
blood pressure is controled on amlodipine 5mg  qd, Lisinopril 40mg  qd.

## 2020-10-26 NOTE — Assessment & Plan Note (Addendum)
No active seizures since last seen,  takes Keppra 500mg  bid. Last seen by neurology 05/09/20

## 2020-10-26 NOTE — Assessment & Plan Note (Signed)
EF 65-70%, grade 1 diastolic dysfunction, off  Furosemide 40mg qd, saw Cardiology 

## 2020-10-26 NOTE — Assessment & Plan Note (Addendum)
Chronic lower back pain/right hip pain, well controlled pain, on Gabapentin 200mg  qd. s/p left hip surgery, chronic right hip pain, w/c helps, prn Tylenol. Will change Methocarbamol to prn/daily

## 2020-10-26 NOTE — Assessment & Plan Note (Signed)
Treated left leg cellulitis with Doxycyline bid x 7 days 09/18/20, 10/12/20

## 2020-10-26 NOTE — Progress Notes (Addendum)
Location:   [facility] Room Number: [place] of Service:  SNF (31) Provider:  Peytan Andringa Otho Darner, NP    Patient Care Team: [name], MD as PCP - General (Internal Medicine) Gatha Mayer, MD as Consulting Physician (Gastroenterology) [name], [name], MD as Consulting Physician (Cardiology) [name], MD as Attending Physician (Internal Medicine) Sundra Haddix X, NP as Nurse Practitioner (Internal Medicine)  Extended Emergency Contact Information Primary Emergency Contact: Vancuren,[name] Address: [place], [place] [address] [name] of [place] Phone: [phone] Mobile Phone: [phone] Relation: Daughter Secondary Emergency Contact: Bronaugh,[name] Address: [place] [address] [name] of [place] Phone: [phone] Mobile Phone: [phone] Relation: Son  Code Status:  FULL CODE Goals of care: Advanced Directive information Advanced Directives 10/26/2020  Does Patient Have a Medical Advance Directive? Yes  Type of Paramedic of [place];Living will  Does patient want to make changes to medical advance directive? No - Patient declined  Copy of [place] in Chart? Yes - validated most recent copy scanned in chart (See row information)  Pre-existing out of facility DNR order (yellow form or pink MOST form) -     Chief Complaint  Patient presents with  . Medical Management of Chronic Issues    Routine follow up.     HPI:  Pt is a 87 y.o. male seen today for medical management of chronic diseases.    Recurrent falls, due to unsteady on his legs, attempting standing up or transfer with no assistance. BPH, saw urology 08/02/20 PVD, BLE brownish pigmentation,mild erythematous shins.09/18/20 Dr. Lyndel Safe started Furosemide 40mg  qd/Kcl qd, the patient requested to stop4/22/22 Treated left  leg cellulitis with Doxycyline bid x 7 days 09/18/20, 10/12/20 Afib,onset 09/2019,heart rate is conrolled, not a candidate for anticoagulation,  CHF, EF 09-23%, grade 1 diastolic dysfunction,offFurosemide 40mg  qd, saw Cardiology  HTN, blood pressure is controled on amlodipine 5mg  qd, Lisinopril 40mg  qd.  Hx of CAD, hyperlipidemia, on Crestor, ASA. LDL 41 04/06/20. 10/09/20 EKG SR vent rate 65, no significant ST-T changes.  GERD, takes Omeprazole 20mg  qd.Hgb 13.5 10/09/20 Insomnia, stable, on Trazodone Chronic lower back pain/right hip pain, on Gabapentin 200mg  qd OA, s/p left hip surgery, chronic right hip pain, w/c helps, prn Tylenol Seizure: takes Keppra 500mg  bid. Last seen by neurology 05/09/20   Past Medical History:  Diagnosis Date  . Allergic rhinitis   . Amnesia   . Atherosclerotic heart disease of native coronary artery without angina pectoris   . BPH (benign prostatic hyperplasia)   . Bradycardia   . Chronic diastolic (congestive) heart failure ([place])   . Colon polyps   . Coronary artery disease    MODERATE  . Dizziness   . Edema of lower extremity   . Erectile dysfunction   . H. pylori infection    Hx of   . Hard of hearing   . Headache(784.0)   . History of colon polyps 2009   Dr. Orr/Colonoscopy -small cecal adenoma  . History of falling   . Hx of colonoscopy 2009   Dr Lajoyce Corners, hemorrhoids & polyps  . Hyperlipidemia   . Hypertension   . Hypertensive heart disease with heart failure ([place])   . Hypokalemia   . Internal hemorrhoids 2009   Dr. Lajoyce Corners Colonoscopy  . Metabolic encephalopathy   . Mitral valve  regurgitation   . Muscle weakness (generalized)   . Pneumonia   . Primary generalized (osteo)arthritis   . Reflux esophagitis   . Sepsis ([place]) 11/04/2018  . Unspecified hemorrhoids   . Unsteadiness on feet   . Vitamin D deficiency    Past Surgical  History:  Procedure Laterality Date  . APPENDECTOMY    . CARDIAC CATHETERIZATION  1997   REVEALED MILD TO MODERATE IRREGULARITIES. HIS LEFT EVNTRICULAR SYSTOLIC FUNCTION REVEALED NORMAL EJECTION FRACTION WITH EF OF 70%  . CATARACT EXTRACTION, BILATERAL    . COLONOSCOPY  multiple  . fatty tumor removal     benign  . HEMORROIDECTOMY    . HIP ARTHROPLASTY Left 08/17/2013   Procedure: ARTHROPLASTY BIPOLAR HIP;  Surgeon: Gearlean Alf, MD;  Location: [facility];  Service: Orthopedics;  Laterality: Left;  . INGUINAL HERNIA REPAIR Bilateral 2006  . lower back surgery  2006  . NASAL SEPTUM SURGERY    . TONSILLECTOMY    . UPPER GASTROINTESTINAL ENDOSCOPY      No Known Allergies  Allergies as of 10/26/2020   No Known Allergies     Medication List       Accurate as of Oct 26, 2020 11:59 PM. If you have any questions, ask your nurse or doctor.        acetaminophen 500 MG tablet Commonly known as: TYLENOL Take 500 mg by mouth as needed.   amLODipine 5 MG tablet Commonly known as: NORVASC Take 5 mg by mouth daily.   aspirin 81 MG chewable tablet Chew 81 mg by mouth daily.   Carboxymethylcellulose Sod PF 0.5 % Soln Place 1 drop into both eyes daily as needed (dry eyes).   gabapentin 100 MG capsule Commonly known as: NEURONTIN Take 200 mg by mouth at bedtime.   hydrocortisone 2.5 % rectal cream Commonly known as: ANUSOL-HC Place 1 application rectally 2 (two) times daily as needed for anal itching or hemorrhoids (USE PERINEAL APPLICATOR). With perineal applicator   levETIRAcetam 500 MG tablet Commonly known as: KEPPRA Take 1 tablet (500 mg total) by mouth 2 (two) times daily.   lisinopril 40 MG tablet Commonly known as: ZESTRIL Take 40 mg by mouth daily.   methocarbamol 500 MG tablet Commonly known as: ROBAXIN Take 250 mg by mouth as needed. Take 250 mg by mouth in the morning and an additional 250 mg once a day as needed for muscle spasms   mupirocin ointment 2  % Commonly known as: BACTROBAN Apply 1 application topically 2 (two) times daily.   omeprazole 20 MG capsule Commonly known as: PRILOSEC Take 20 mg by mouth daily.   rosuvastatin 5 MG tablet Commonly known as: CRESTOR Take 5 mg by mouth daily.   Thera-M Tabs Take 1 tablet by mouth daily.   traZODone 50 MG tablet Commonly known as: DESYREL Take 0.5 tablets (25 mg total) by mouth at bedtime.   vitamin B-12 1000 MCG tablet Commonly known as: CYANOCOBALAMIN Take 1,000 mcg by mouth daily.       Review of Systems  Constitutional: Negative for fatigue, fever and unexpected weight change.  HENT: Positive for hearing loss. Negative for congestion and voice change.   Eyes: Negative for visual disturbance.  Respiratory: Negative for cough and shortness of breath.   Cardiovascular: Positive for leg swelling.  Gastrointestinal: Negative for abdominal pain and constipation.  Genitourinary: Positive for frequency. Negative for difficulty urinating and urgency.  Musculoskeletal: Positive for arthralgias, back pain and gait problem.  Lower back, right hip/thigh pain   Skin: Negative for color change.       Chronic venous insufficiency skin changes BLE  Neurological: Negative for seizures, weakness and headaches.       Memory lapses. Tingling in toes.   Psychiatric/Behavioral: Negative for behavioral problems and sleep disturbance. The patient is not nervous/anxious.     Immunization History  Administered Date(s) Administered  . Influenza, High Dose Seasonal PF 03/19/2019, 03/19/2019  . Influenza-Unspecified 04/08/2016, 03/27/2020  . Moderna Sars-Covid-2 Vaccination 06/18/2019, 07/16/2019, 04/24/2020  . Pneumococcal Conjugate-13 06/02/2013  . Pneumococcal Polysaccharide-23 04/07/2001   Pertinent  Health Maintenance Due  Topic Date Due  . INFLUENZA VACCINE  01/14/2021  . PNA vac Low Risk Adult  Completed   Fall Risk  11/03/2017 07/11/2016  Falls in the past year? Yes Yes   Number falls in past yr: 2 or more 2 or more  Injury with Fall? Yes Yes  Comment - broke toe on right foot  Risk Factor Category  High Fall Risk -  Follow up Education provided;Falls prevention discussed -   Functional Status Survey:    Vitals:   10/26/20 1019  BP: 130/70  Pulse: 66  Resp: 20  Temp: 97.7 F (36.5 C)  SpO2: 98%  Weight: 173 lb 11.2 oz (78.8 kg)  Height: 5\' 7"  (1.702 m)   Body mass index is 27.21 kg/m. Physical Exam Vitals and nursing note reviewed.  Constitutional:      Appearance: Normal appearance.  HENT:     Head: Normocephalic and atraumatic.     Mouth/Throat:     Mouth: Mucous membranes are moist.  Eyes:     Conjunctiva/sclera: Conjunctivae normal.     Pupils: Pupils are equal, round, and reactive to light.  Cardiovascular:     Rate and Rhythm: Normal rate and regular rhythm.     Heart sounds: Murmur heard.    Pulmonary:     Breath sounds: No rales.  Abdominal:     General: Bowel sounds are normal.     Palpations: Abdomen is soft.     Tenderness: There is no abdominal tenderness.  Genitourinary:    Comments: External hemorrhoids, no bleeding or injury.  Musculoskeletal:     Cervical back: Normal range of motion and neck supple. No muscular tenderness.     Right lower leg: Edema present.     Left lower leg: Edema present.     Comments: 1+ edema BLE. W/c for mobility.   Skin:    General: Skin is warm and dry.     Comments: Chronic venous insufficiency skin changes BLE.   Neurological:     General: No focal deficit present.     Mental Status: He is alert. Mental status is at baseline.     Gait: Gait abnormal.     Comments: Oriented to person, place.   Psychiatric:        Mood and Affect: Mood normal.        Behavior: Behavior normal.        Thought Content: Thought content normal.     Labs reviewed: Recent Labs    01/12/20 0257 04/06/20 0000 09/04/20 0000 10/09/20 1607  NA 134* 137 139 139  K 3.7 4.0 4.2 4.6  CL 95* 104  102 104  CO2  --  25* 29* 28*  GLUCOSE 131*  --   --   --   BUN 13 15 11 15   CREATININE 0.90 1.0 0.9 1.1  CALCIUM  --  8.6* 9.2 8.8   Recent Labs    04/06/20 0000 10/09/20 1607  AST 16 16  ALT 16 14  ALKPHOS 66 54  ALBUMIN 3.4* 3.4*   Recent Labs    01/03/20 0000 01/12/20 0250 01/12/20 0257 10/09/20 1607  WBC 6.1 11.6*  --  8.9  NEUTROABS 3,440.00 10.0*  --  5,963.00  HGB 14.0 14.1 13.9 13.5  HCT 40* 40.7 41.0 39*  MCV  --  93.3  --   --   PLT 188 174  --  236   Lab Results  Component Value Date   TSH 1.77 04/06/2020   No results found for: HGBA1C Lab Results  Component Value Date   CHOL 94 04/06/2020   HDL 37 04/06/2020   LDLCALC 41 04/06/2020   TRIG 79 04/06/2020   CHOLHDL 2.7 02/17/2017    Significant Diagnostic Results in last 30 days:  No results found.  Assessment/Plan Hypertension blood pressure is controled on amlodipine 5mg  qd, Lisinopril 40mg  qd.    CAD (coronary artery disease) CAD, hyperlipidemia, on Crestor, ASA. LDL 41 04/06/20. 10/09/20 EKG SR vent rate 65, no significant ST-T changes.   GERD (gastroesophageal reflux disease) takes Omeprazole 20mg  qd.Hgb 13.5 10/09/20   Insomnia stable, dc Trazodone-not used.   Osteoarthritis, multiple sites Chronic lower back pain/right hip pain, well controlled pain, on Gabapentin 200mg  qd. s/p left hip surgery, chronic right hip pain, w/c helps, prn Tylenol. Will change Methocarbamol to prn/daily    Seizure ([place]) No active seizures since last seen,  takes Keppra 500mg  bid. Last seen by neurology [id number]  Chronic diastolic heart failure ([facility]) EF [id number], grade 1 diastolic dysfunction,offFurosemide 40mg  qd, saw Cardiology    New onset atrial fibrillation ([place]) onset 09/2019,heart rate is conrolled, not a candidate for anticoagulation,   Cellulitis of left lower leg Treated left leg cellulitis with Doxycyline bid x 7 days 09/18/20, 10/12/20   Venous insufficiency (chronic)  (peripheral) BLE brownish pigmentation,mild erythematous shins.09/18/20 Dr. Lyndel Safe started Furosemide 40mg  qd/Kcl qd, the patient requested to stop4/22/22   BPH (benign prostatic hyperplasia) saw urology 08/02/20  Recurrent falls Recurrent falls, due to unsteady on his legs, attempting standing up or transfer with no assistance.      Family/ staff Communication:  Plan of care reviewed with the patient and charge nurse.   Labs/tests ordered:  none  Time spend 35 minutes.

## 2020-10-26 NOTE — Assessment & Plan Note (Addendum)
stable, dc Trazodone-not used.

## 2020-10-26 NOTE — Assessment & Plan Note (Signed)
Recurrent falls, due to unsteady on his legs, attempting standing up or transfer with no assistance.  

## 2020-10-26 NOTE — Assessment & Plan Note (Signed)
BLE brownish pigmentation,mild erythematous shins.09/18/20 Dr. Lyndel Safe started Furosemide 40mg  qd/Kcl qd, the patient requested to stop4/22/22

## 2020-10-26 NOTE — Assessment & Plan Note (Signed)
CAD, hyperlipidemia, on Crestor, ASA. LDL 41 04/06/20. 10/09/20 EKG SR vent rate 65, no significant ST-T changes.

## 2020-10-26 NOTE — Assessment & Plan Note (Signed)
onset 09/2019,heart rate is conrolled, not a candidate for anticoagulation,

## 2020-10-26 NOTE — Assessment & Plan Note (Signed)
takes Omeprazole 20mg qd.Hgb 13.5 10/09/20 

## 2020-10-26 NOTE — Assessment & Plan Note (Signed)
saw urology 08/02/20  

## 2020-10-30 ENCOUNTER — Encounter: Payer: Self-pay | Admitting: Nurse Practitioner

## 2020-11-13 ENCOUNTER — Non-Acute Institutional Stay (SKILLED_NURSING_FACILITY): Payer: Medicare HMO | Admitting: Nurse Practitioner

## 2020-11-13 ENCOUNTER — Encounter: Payer: Self-pay | Admitting: Nurse Practitioner

## 2020-11-13 DIAGNOSIS — K219 Gastro-esophageal reflux disease without esophagitis: Secondary | ICD-10-CM

## 2020-11-13 DIAGNOSIS — R29898 Other symptoms and signs involving the musculoskeletal system: Secondary | ICD-10-CM | POA: Diagnosis not present

## 2020-11-13 DIAGNOSIS — N138 Other obstructive and reflux uropathy: Secondary | ICD-10-CM

## 2020-11-13 DIAGNOSIS — I5032 Chronic diastolic (congestive) heart failure: Secondary | ICD-10-CM | POA: Diagnosis not present

## 2020-11-13 DIAGNOSIS — M5440 Lumbago with sciatica, unspecified side: Secondary | ICD-10-CM | POA: Diagnosis not present

## 2020-11-13 DIAGNOSIS — I872 Venous insufficiency (chronic) (peripheral): Secondary | ICD-10-CM | POA: Diagnosis not present

## 2020-11-13 DIAGNOSIS — I10 Essential (primary) hypertension: Secondary | ICD-10-CM | POA: Diagnosis not present

## 2020-11-13 DIAGNOSIS — I4891 Unspecified atrial fibrillation: Secondary | ICD-10-CM

## 2020-11-13 DIAGNOSIS — R569 Unspecified convulsions: Secondary | ICD-10-CM

## 2020-11-13 DIAGNOSIS — I2510 Atherosclerotic heart disease of native coronary artery without angina pectoris: Secondary | ICD-10-CM

## 2020-11-13 DIAGNOSIS — F5101 Primary insomnia: Secondary | ICD-10-CM

## 2020-11-13 DIAGNOSIS — N401 Enlarged prostate with lower urinary tract symptoms: Secondary | ICD-10-CM

## 2020-11-13 DIAGNOSIS — R69 Illness, unspecified: Secondary | ICD-10-CM | POA: Diagnosis not present

## 2020-11-13 NOTE — Assessment & Plan Note (Signed)
CAD, hyperlipidemia, on Crestor, ASA. LDL 41 04/06/20.10/09/20 EKG SR vent rate 65, no significant ST-T changes.

## 2020-11-13 NOTE — Assessment & Plan Note (Signed)
blood pressure is controled on amlodipine 5mg  qd, Lisinopril 40mg  qd.

## 2020-11-13 NOTE — Assessment & Plan Note (Signed)
EF 00-34%, grade 1 diastolic dysfunction,offFurosemide 40mg  qd, saw Cardiology

## 2020-11-13 NOTE — Assessment & Plan Note (Signed)
takes Keppra 500mg  bid. Last seen by neurology 05/09/20

## 2020-11-13 NOTE — Assessment & Plan Note (Signed)
BLE brownish pigmentation,mild erythematous shins.09/18/20 Dr. Lyndel Safe started Furosemide 40mg  qd/Kcl qd, the patient requested to stop4/22/22

## 2020-11-13 NOTE — Assessment & Plan Note (Signed)
on Gabapentin 200mg  qd, s/p left hip surgery, chronic right hip pain, w/c helps, prn Tylenol

## 2020-11-13 NOTE — Assessment & Plan Note (Signed)
onset 09/2019,heart rate is conrolled, not a candidate for anticoagulation,

## 2020-11-13 NOTE — Assessment & Plan Note (Signed)
c/o the right hand numbness, weakness, difficult feeding self x 1 week, it seems getting better, muscle strength 5/5 but slightly weaker than the right hand. Denied headache, change of vision, slurred speech, chest pain/pressure, palpitation, or change of R+L strength.  Clinically presumed stroke, will change ASA from 81mg  to 325mg  with meal per patient's request, will refer to therapy for eval/tx

## 2020-11-13 NOTE — Progress Notes (Signed)
Location:   SNF [place] Room Number: 78 Place of Service:  SNF (31) Provider: [facility] Janei Scheff NP  [name], MD  Patient Care Team: [name], MD as PCP - General (Internal Medicine) Gatha Mayer, MD as Consulting Physician (Gastroenterology) [name], [name], MD as Consulting Physician (Cardiology) [name], MD as Attending Physician (Internal Medicine) Dewel Lotter X, NP as Nurse Practitioner (Internal Medicine)  Extended Emergency Contact Information Primary Emergency Contact: Quesada,[name] Address: [place], [place] [address] [name] of [place] Phone: [phone] Mobile Phone: [phone] Relation: Daughter Secondary Emergency Contact: Merrihew,[name] Address: [place] [address] [name] of [place] Phone: [phone] Mobile Phone: [phone] Relation: Son  Code Status: DNR Goals of care: Advanced Directive information Advanced Directives 10/26/2020  Does Patient Have a Medical Advance Directive? Yes  Type of Paramedic of [place];Living will  Does patient want to make changes to medical advance directive? No - Patient declined  Copy of [place] in Chart? Yes - validated most recent copy scanned in chart (See row information)  Pre-existing out of facility DNR order (yellow form or pink MOST form) -     Chief Complaint  Patient presents with  . Acute Visit    Right hand numbness/weakness x 1 weeks    HPI:  Pt is a 87 y.o. male seen today for an acute visit for c/o the right hand numbness, weakness, difficult feeding self x 1 week, it seems getting better, muscle strength 5/5 but slightly weaker than the right hand. Denied headache, change of vision, slurred speech, chest pain/pressure, palpitation, or change of R+L strength.   Recurrent falls, due to unsteady on his legs, attempting standing up or transfer with no  assistance. BPH, saw urology 08/02/20 PVD, BLE brownish pigmentation,mild erythematous shins.09/18/20 Dr. Lyndel Safe started Furosemide 40mg  qd/Kcl qd, the patient requested to stop4/22/22 Afib,onset 09/2019,heart rate is conrolled, not a candidate for anticoagulation,  CHF, EF 02-72%, grade 1 diastolic dysfunction,offFurosemide 40mg  qd, saw Cardiology  HTN, blood pressure is controled on amlodipine 5mg  qd, Lisinopril 40mg  qd.  Hx of CAD, hyperlipidemia, on Crestor, ASA. LDL 41 04/06/20.10/09/20 EKG SR vent rate 65, no significant ST-T changes. GERD, takes Omeprazole 20mg  qd.Hgb 13.5 10/09/20 Insomnia, stable, on Trazodone Chronic lower back pain/right hip pain, on Gabapentin 200mg  qd OA, s/p left hip surgery, chronic right hip pain, w/c helps, prn Tylenol Seizure: takes Keppra 500mg  bid. Last seen by neurology 05/09/20    Past Medical History:  Diagnosis Date  . Allergic rhinitis   . Amnesia   . Atherosclerotic heart disease of native coronary artery without angina pectoris   . BPH (benign prostatic hyperplasia)   . Bradycardia   . Chronic diastolic (congestive) heart failure ([place])   . Colon polyps   . Coronary artery disease    MODERATE  . Dizziness   . Edema of lower extremity   . Erectile dysfunction   . H. pylori infection    Hx of   . Hard of hearing   . Headache(784.0)   . History of colon polyps 2009   Dr. Orr/Colonoscopy -small cecal adenoma  . History of falling   . Hx of colonoscopy 2009   Dr Lajoyce Corners, hemorrhoids & polyps  . Hyperlipidemia   . Hypertension   . Hypertensive heart disease with heart failure ([place])   .  Hypokalemia   . Internal hemorrhoids 2009   Dr. Lajoyce Corners Colonoscopy  . Metabolic encephalopathy   . Mitral valve regurgitation   . Muscle weakness (generalized)   . Pneumonia   . Primary generalized (osteo)arthritis    . Reflux esophagitis   . Sepsis ([place]) 11/04/2018  . Unspecified hemorrhoids   . Unsteadiness on feet   . Vitamin D deficiency    Past Surgical History:  Procedure Laterality Date  . APPENDECTOMY    . CARDIAC CATHETERIZATION  1997   REVEALED MILD TO MODERATE IRREGULARITIES. HIS LEFT EVNTRICULAR SYSTOLIC FUNCTION REVEALED NORMAL EJECTION FRACTION WITH EF OF 70%  . CATARACT EXTRACTION, BILATERAL    . COLONOSCOPY  multiple  . fatty tumor removal     benign  . HEMORROIDECTOMY    . HIP ARTHROPLASTY Left 08/17/2013   Procedure: ARTHROPLASTY BIPOLAR HIP;  Surgeon: Gearlean Alf, MD;  Location: [facility];  Service: Orthopedics;  Laterality: Left;  . INGUINAL HERNIA REPAIR Bilateral 2006  . lower back surgery  2006  . NASAL SEPTUM SURGERY    . TONSILLECTOMY    . UPPER GASTROINTESTINAL ENDOSCOPY      No Known Allergies  Allergies as of 11/13/2020   No Known Allergies     Medication List       Accurate as of Nov 13, 2020  4:50 PM. If you have any questions, ask your nurse or doctor.        STOP taking these medications   traZODone 50 MG tablet Commonly known as: DESYREL Stopped by: Gladstone Rosas X Madellyn Denio, NP     TAKE these medications   acetaminophen 500 MG tablet Commonly known as: TYLENOL Take 500 mg by mouth as needed.   amLODipine 5 MG tablet Commonly known as: NORVASC Take 5 mg by mouth daily.   aspirin 325 MG tablet Take 325 mg by mouth daily.   Carboxymethylcellulose Sod PF 0.5 % Soln Place 1 drop into both eyes daily as needed (dry eyes).   gabapentin 100 MG capsule Commonly known as: NEURONTIN Take 200 mg by mouth at bedtime.   hydrocortisone 2.5 % rectal cream Commonly known as: ANUSOL-HC Place 1 application rectally 2 (two) times daily as needed for anal itching or hemorrhoids (USE PERINEAL APPLICATOR). With perineal applicator   levETIRAcetam 500 MG tablet Commonly known as: KEPPRA Take 1 tablet (500 mg total) by mouth 2 (two) times daily.   lisinopril 40 MG  tablet Commonly known as: ZESTRIL Take 40 mg by mouth daily.   methocarbamol 500 MG tablet Commonly known as: ROBAXIN Take 250 mg by mouth as needed. Take 250 mg by mouth in the morning and an additional 250 mg once a day as needed for muscle spasms   mupirocin ointment 2 % Commonly known as: BACTROBAN Apply 1 application topically 2 (two) times daily.   omeprazole 20 MG capsule Commonly known as: PRILOSEC Take 20 mg by mouth daily.   rosuvastatin 5 MG tablet Commonly known as: CRESTOR Take 5 mg by mouth daily.   Thera-M Tabs Take 1 tablet by mouth daily.   vitamin B-12 1000 MCG tablet Commonly known as: CYANOCOBALAMIN Take 1,000 mcg by mouth daily.       Review of Systems  Constitutional: Negative for activity change, appetite change and fever.  HENT: Positive for hearing loss. Negative for congestion and voice change.   Eyes: Negative for visual disturbance.  Respiratory: Negative for cough and shortness of breath.   Cardiovascular: Positive for leg swelling.  Gastrointestinal: Negative  for abdominal pain and constipation.  Genitourinary: Positive for frequency. Negative for difficulty urinating and urgency.  Musculoskeletal: Positive for arthralgias, back pain and gait problem.       Lower back, right hip/thigh pain   Skin: Negative for color change.       Chronic venous insufficiency skin changes BLE  Neurological: Positive for weakness. Negative for seizures and headaches.       Memory lapses. Tingling in toes. Right hand weakness/numbness x 1 week  Psychiatric/Behavioral: Negative for behavioral problems and sleep disturbance. The patient is not nervous/anxious.     Immunization History  Administered Date(s) Administered  . Influenza, High Dose Seasonal PF 03/19/2019, 03/19/2019  . Influenza-Unspecified 04/08/2016, 03/27/2020  . Moderna Sars-Covid-2 Vaccination 06/18/2019, 07/16/2019, 04/24/2020  . Pneumococcal Conjugate-13 06/02/2013  . Pneumococcal  Polysaccharide-23 04/07/2001   Pertinent  Health Maintenance Due  Topic Date Due  . INFLUENZA VACCINE  01/14/2021  . PNA vac Low Risk Adult  Completed   Fall Risk  11/03/2017 07/11/2016  Falls in the past year? Yes Yes  Number falls in past yr: 2 or more 2 or more  Injury with Fall? Yes Yes  Comment - broke toe on right foot  Risk Factor Category  High Fall Risk -  Follow up Education provided;Falls prevention discussed -   Functional Status Survey:    Vitals:   11/13/20 1614  BP: 120/62  Pulse: 62  Resp: 20  Temp: 97.8 F (36.6 C)  SpO2: 93%  Weight: 173 lb 11.2 oz (78.8 kg)  Height: 5\' 7"  (1.702 m)   Body mass index is 27.21 kg/m. Physical Exam Vitals and nursing note reviewed.  Constitutional:      Appearance: Normal appearance.  HENT:     Head: Normocephalic and atraumatic.     Mouth/Throat:     Mouth: Mucous membranes are moist.  Eyes:     Conjunctiva/sclera: Conjunctivae normal.     Pupils: Pupils are equal, round, and reactive to light.  Cardiovascular:     Rate and Rhythm: Normal rate and regular rhythm.     Heart sounds: Murmur heard.    Pulmonary:     Breath sounds: No rales.  Abdominal:     General: Bowel sounds are normal.     Palpations: Abdomen is soft.     Tenderness: There is no abdominal tenderness.  Genitourinary:    Comments: External hemorrhoids, no bleeding or injury.  Musculoskeletal:     Cervical back: Normal range of motion and neck supple. No muscular tenderness.     Right lower leg: Edema present.     Left lower leg: Edema present.     Comments: 1+ edema BLE. W/c for mobility.   Skin:    General: Skin is warm and dry.     Comments: Chronic venous insufficiency skin changes BLE.   Neurological:     General: No focal deficit present.     Mental Status: He is alert. Mental status is at baseline.     Motor: Weakness present.     Gait: Gait abnormal.     Comments: Oriented to person, place. Right hand grip is weaker than the left,  but muscle strength is 5/5  Psychiatric:        Mood and Affect: Mood normal.        Behavior: Behavior normal.        Thought Content: Thought content normal.     Labs reviewed: Recent Labs    01/12/20 0257 04/06/20 0000 09/04/20  0000 10/09/20 1607  NA 134* 137 139 139  K 3.7 4.0 4.2 4.6  CL 95* 104 102 104  CO2  --  25* 29* 28*  GLUCOSE 131*  --   --   --   BUN 13 15 11 15   CREATININE 0.90 1.0 0.9 1.1  CALCIUM  --  8.6* 9.2 8.8   Recent Labs    04/06/20 0000 10/09/20 1607  AST 16 16  ALT 16 14  ALKPHOS 66 54  ALBUMIN 3.4* 3.4*   Recent Labs    01/03/20 0000 01/12/20 0250 01/12/20 0257 10/09/20 1607  WBC 6.1 11.6*  --  8.9  NEUTROABS 3,440.00 10.0*  --  5,963.00  HGB 14.0 14.1 13.9 13.5  HCT 40* 40.7 41.0 39*  MCV  --  93.3  --   --   PLT 188 174  --  236   Lab Results  Component Value Date   TSH 1.77 04/06/2020   No results found for: HGBA1C Lab Results  Component Value Date   CHOL 94 04/06/2020   HDL 37 04/06/2020   LDLCALC 41 04/06/2020   TRIG 79 04/06/2020   CHOLHDL 2.7 02/17/2017    Significant Diagnostic Results in last 30 days:  No results found.  Assessment/Plan: Right hand weakness c/o the right hand numbness, weakness, difficult feeding self x 1 week, it seems getting better, muscle strength 5/5 but slightly weaker than the right hand. Denied headache, change of vision, slurred speech, chest pain/pressure, palpitation, or change of R+L strength.  Clinically presumed stroke, will change ASA from 81mg  to 325mg  with meal per patient's request, will refer to therapy for eval/tx  New onset atrial fibrillation ([place]) onset 09/2019,heart rate is conrolled, not a candidate for anticoagulation,   Venous insufficiency (chronic) (peripheral) BLE brownish pigmentation,mild erythematous shins.09/18/20 Dr. Lyndel Safe started Furosemide 40mg  qd/Kcl qd, the patient requested to stop4/22/22   BPH (benign prostatic hyperplasia) saw urology  08/02/20   Chronic diastolic heart failure (HCC) EF 35-45%, grade 1 diastolic dysfunction,offFurosemide 40mg  qd, saw Cardiology   Hypertension  blood pressure is controled on amlodipine 5mg  qd, Lisinopril 40mg  qd.    CAD (coronary artery disease) CAD, hyperlipidemia, on Crestor, ASA. LDL 41 04/06/20.10/09/20 EKG SR vent rate 65, no significant ST-T changes.   GERD (gastroesophageal reflux disease) takes Omeprazole 20mg  qd.Hgb 13.5 10/09/20  Insomnia stable, on Trazodone   Lower back pain on Gabapentin 200mg  qd, s/p left hip surgery, chronic right hip pain, w/c helps, prn Tylenol    Seizure ([place]) takes Keppra 500mg  bid. Last seen by neurology 05/09/20     Family/ staff Communication: plan of care reviewed with the patient and charge nurse.   Labs/tests ordered:  None  Time spend 35 minutes.

## 2020-11-13 NOTE — Assessment & Plan Note (Signed)
stable, on Trazodone

## 2020-11-13 NOTE — Assessment & Plan Note (Signed)
takes Omeprazole 20mg  qd.Hgb 13.5 10/09/20

## 2020-11-13 NOTE — Assessment & Plan Note (Signed)
saw urology 08/02/20

## 2020-11-14 DIAGNOSIS — E876 Hypokalemia: Secondary | ICD-10-CM | POA: Diagnosis not present

## 2020-11-14 DIAGNOSIS — I10 Essential (primary) hypertension: Secondary | ICD-10-CM | POA: Diagnosis not present

## 2020-11-14 LAB — CBC AND DIFFERENTIAL
HCT: 43 (ref 41–53)
Hemoglobin: 14.2 (ref 13.5–17.5)
Neutrophils Absolute: 7889
Platelets: 231 (ref 150–399)
WBC: 11.5

## 2020-11-14 LAB — COMPREHENSIVE METABOLIC PANEL
Albumin: 3.9 (ref 3.5–5.0)
Calcium: 9.2 (ref 8.7–10.7)
GFR calc Af Amer: 82
GFR calc non Af Amer: 71
Globulin: 2.2

## 2020-11-14 LAB — HEPATIC FUNCTION PANEL
ALT: 20 (ref 10–40)
AST: 18 (ref 14–40)
Alkaline Phosphatase: 63 (ref 25–125)
Bilirubin, Total: 0.8

## 2020-11-14 LAB — BASIC METABOLIC PANEL
BUN: 17 (ref 4–21)
CO2: 28 — AB (ref 13–22)
Chloride: 102 (ref 99–108)
Creatinine: 0.9 (ref 0.6–1.3)
Glucose: 94
Potassium: 4.3 (ref 3.4–5.3)
Sodium: 137 (ref 137–147)

## 2020-11-14 LAB — CBC: RBC: 4.51 (ref 3.87–5.11)

## 2020-11-23 ENCOUNTER — Non-Acute Institutional Stay (SKILLED_NURSING_FACILITY): Payer: Medicare HMO | Admitting: Nurse Practitioner

## 2020-11-23 ENCOUNTER — Encounter: Payer: Self-pay | Admitting: Nurse Practitioner

## 2020-11-23 DIAGNOSIS — I2510 Atherosclerotic heart disease of native coronary artery without angina pectoris: Secondary | ICD-10-CM

## 2020-11-23 DIAGNOSIS — I10 Essential (primary) hypertension: Secondary | ICD-10-CM

## 2020-11-23 DIAGNOSIS — K219 Gastro-esophageal reflux disease without esophagitis: Secondary | ICD-10-CM | POA: Diagnosis not present

## 2020-11-23 DIAGNOSIS — M8949 Other hypertrophic osteoarthropathy, multiple sites: Secondary | ICD-10-CM

## 2020-11-23 DIAGNOSIS — R569 Unspecified convulsions: Secondary | ICD-10-CM | POA: Diagnosis not present

## 2020-11-23 DIAGNOSIS — M159 Polyosteoarthritis, unspecified: Secondary | ICD-10-CM

## 2020-11-23 DIAGNOSIS — R69 Illness, unspecified: Secondary | ICD-10-CM | POA: Diagnosis not present

## 2020-11-23 DIAGNOSIS — F5101 Primary insomnia: Secondary | ICD-10-CM

## 2020-11-23 DIAGNOSIS — I5032 Chronic diastolic (congestive) heart failure: Secondary | ICD-10-CM

## 2020-11-23 DIAGNOSIS — M5440 Lumbago with sciatica, unspecified side: Secondary | ICD-10-CM

## 2020-11-23 NOTE — Progress Notes (Signed)
Location:   [place] Room Number: [place] of Service:  SNF (31) Provider:  Skyler Carel Otho Darner, NP    Patient Care Team: [name], MD as PCP - General (Internal Medicine) Gatha Mayer, MD as Consulting Physician (Gastroenterology) [name], [name], MD as Consulting Physician (Cardiology) [name], MD as Attending Physician (Internal Medicine) Elma Limas X, NP as Nurse Practitioner (Internal Medicine)  Extended Emergency Contact Information Primary Emergency Contact: Shelden,[name] Address: [place], [place] [address] [name] of [place] Phone: [phone] Mobile Phone: [phone] Relation: Daughter Secondary Emergency Contact: Grzesiak,[name] Address: [place] [address] [name] of [place] Phone: [phone] Mobile Phone: [phone] Relation: Son  Code Status:  DNR Goals of care: Advanced Directive information Advanced Directives 11/23/2020  Does Patient Have a Medical Advance Directive? Yes  Type of Paramedic of [place];Living will  Does patient want to make changes to medical advance directive? No - Patient declined  Copy of [place] in Chart? Yes - validated most recent copy scanned in chart (See row information)  Pre-existing out of facility DNR order (yellow form or pink MOST form) -     Chief Complaint  Patient presents with   Medical Management of Chronic Issues    Routine follow up.    Health Maintenance    Discuss need for shingles vaccine.     HPI:  Pt is a 87 y.o. male seen today for medical management of chronic diseases.    TIA had right hand numbness, weakness, difficulty feeding self, improved, changed to ASA 325mg  qd from 81mg  11/13/20  Recurrent falls, due to unsteady on his legs, attempting standing up or transfer with no assistance.              BPH, saw urology 08/02/20                PVD, BLE brownish pigmentation, mild erythematous shins. 09/18/20 Dr. Lyndel Safe started Furosemide 40mg  qd/Kcl qd, the patient requested to stop 10/05/20             Afib, onset 09/2019, heart rate is conrolled, not a candidate for anticoagulation,             CHF, EF 02-72%, grade 1 diastolic dysfunction, off  Furosemide 40mg  qd, saw Cardiology             HTN, blood pressure is controled on amlodipine 5mg  qd, Lisinopril 40mg  qd. Bun/creat 17/0.9 11/14/20             Hx of CAD, hyperlipidemia, on Crestor, ASA. LDL 41 04/06/20. 10/09/20 EKG SR vent rate 65, no significant ST-T changes.              GERD, takes Omeprazole 20mg  qd. Hgb 14.2 11/14/20             Insomnia, stable, off Trazodone.              Chronic lower back pain/right hip pain,  on Gabapentin 200mg  qd             OA, s/p left hip surgery, chronic right hip pain, w/c helps, prn Tylenol             Seizure: takes Keppra 500mg  bid. Last seen by neurology 05/09/20       Past Medical History:  Diagnosis Date   Allergic rhinitis    Amnesia    Atherosclerotic heart disease of native coronary artery without angina pectoris    BPH (benign prostatic hyperplasia)    Bradycardia    Chronic diastolic (congestive) heart failure (HCC)    Colon polyps    Coronary artery disease    MODERATE   Dizziness    Edema of lower extremity    Erectile dysfunction    H. pylori infection    Hx of    Hard of hearing    Headache(784.0)    History of colon polyps 2009   Dr. Orr/Colonoscopy -small cecal adenoma   History of falling    Hx of colonoscopy 2009   Dr Lajoyce Corners, hemorrhoids & polyps   Hyperlipidemia    Hypertension    Hypertensive heart disease with heart failure ([place])    Hypokalemia    Internal hemorrhoids 2009   Dr. Lajoyce Corners Colonoscopy   Metabolic encephalopathy    Mitral valve regurgitation    Muscle weakness (generalized)    Pneumonia    Primary generalized (osteo)arthritis    Reflux esophagitis    Sepsis ([place]) 11/04/2018   Unspecified  hemorrhoids    Unsteadiness on feet    Vitamin D deficiency    Past Surgical History:  Procedure Laterality Date   APPENDECTOMY     CARDIAC CATHETERIZATION  1997   REVEALED MILD TO MODERATE IRREGULARITIES. HIS LEFT EVNTRICULAR SYSTOLIC FUNCTION REVEALED NORMAL EJECTION FRACTION WITH EF OF 70%   CATARACT EXTRACTION, BILATERAL     COLONOSCOPY  multiple   fatty tumor removal     benign   HEMORROIDECTOMY     HIP ARTHROPLASTY Left 08/17/2013   Procedure: ARTHROPLASTY BIPOLAR HIP;  Surgeon: Gearlean Alf, MD;  Location: [facility];  Service: Orthopedics;  Laterality: Left;   INGUINAL HERNIA REPAIR Bilateral 2006   lower back surgery  2006   NASAL SEPTUM SURGERY     TONSILLECTOMY     UPPER GASTROINTESTINAL ENDOSCOPY      No Known Allergies  Allergies as of 11/23/2020   No Known Allergies      Medication List        Accurate as of November 23, 2020 11:59 PM. If you have any questions, ask your nurse or doctor.          acetaminophen 500 MG tablet Commonly known as: TYLENOL Take 500 mg by mouth as needed.   amLODipine 5 MG tablet Commonly known as: NORVASC Take 5 mg by mouth daily.   aspirin 325 MG tablet Take 325 mg by mouth daily.   Carboxymethylcellulose Sod PF 0.5 % Soln Place 1 drop into both eyes daily as needed (dry eyes).   gabapentin 100 MG capsule Commonly known as: NEURONTIN Take 200 mg by mouth at bedtime.   hydrocortisone 2.5 % rectal cream Commonly known as: ANUSOL-HC Place 1 application rectally 2 (two) times daily as needed for anal itching or hemorrhoids (USE PERINEAL APPLICATOR). With perineal applicator   levETIRAcetam 500 MG tablet Commonly known as: KEPPRA Take 1 tablet (500 mg total) by mouth 2 (two) times daily.   lisinopril 40 MG tablet Commonly known as: ZESTRIL Take 40 mg by mouth daily.   METHOCARBAMOL PO Take 250 mg by mouth as needed. Take 250 mg by mouth in the morning and an additional 250 mg once a day as needed for muscle spasms    mupirocin ointment 2 % Commonly known as: BACTROBAN Apply 1 application topically 2 (two) times daily.  omeprazole 20 MG capsule Commonly known as: PRILOSEC Take 20 mg by mouth daily.   rosuvastatin 5 MG tablet Commonly known as: CRESTOR Take 5 mg by mouth daily.   Thera-M Tabs Take 1 tablet by mouth daily.   vitamin B-12 1000 MCG tablet Commonly known as: CYANOCOBALAMIN Take 1,000 mcg by mouth daily.        Review of Systems  Constitutional:  Negative for activity change, appetite change and fever.  HENT:  Positive for hearing loss. Negative for congestion and voice change.   Eyes:  Negative for visual disturbance.  Respiratory:  Negative for cough and shortness of breath.   Cardiovascular:  Positive for leg swelling.  Gastrointestinal:  Negative for abdominal pain and constipation.  Genitourinary:  Positive for frequency. Negative for difficulty urinating and urgency.  Musculoskeletal:  Positive for arthralgias, back pain and gait problem.       Lower back, right hip/thigh pain   Skin:  Negative for color change.       Chronic venous insufficiency skin changes BLE  Neurological:  Negative for seizures, weakness and headaches.       Memory lapses. Tingling in toes. Right hand weakness/numbness is improved, placed ASA 325 11/13/20  Psychiatric/Behavioral:  Negative for behavioral problems and sleep disturbance. The patient is not nervous/anxious.    Immunization History  Administered Date(s) Administered   Influenza, High Dose Seasonal PF 03/19/2019, 03/19/2019   Influenza-Unspecified 04/08/2016, 03/27/2020   Moderna Sars-Covid-2 Vaccination 06/18/2019, 07/16/2019, 04/24/2020, 11/13/2020   Pneumococcal Conjugate-13 06/02/2013   Pneumococcal Polysaccharide-23 04/07/2001   Pertinent  Health Maintenance Due  Topic Date Due   INFLUENZA VACCINE  01/14/2021   PNA vac Low Risk Adult  Completed   Fall Risk  11/03/2017 07/11/2016  Falls in the past year? Yes Yes  Number  falls in past yr: 2 or more 2 or more  Injury with Fall? Yes Yes  Comment - broke toe on right foot  Risk Factor Category  High Fall Risk -  Follow up Education provided;Falls prevention discussed -   Functional Status Survey:    Vitals:   11/23/20 0930  BP: 140/70  Pulse: 68  Resp: 18  Temp: 97.8 F (36.6 C)  SpO2: 97%  Weight: 180 lb (81.6 kg)  Height: 5\' 7"  (1.702 m)   Body mass index is 28.19 kg/m. Physical Exam Vitals and nursing note reviewed.  Constitutional:      Appearance: Normal appearance.  HENT:     Head: Normocephalic and atraumatic.     Mouth/Throat:     Mouth: Mucous membranes are moist.  Eyes:     Conjunctiva/sclera: Conjunctivae normal.     Pupils: Pupils are equal, round, and reactive to light.  Cardiovascular:     Rate and Rhythm: Normal rate and regular rhythm.     Heart sounds: Murmur heard.  Pulmonary:     Breath sounds: No rales.  Abdominal:     General: Bowel sounds are normal.     Palpations: Abdomen is soft.     Tenderness: There is no abdominal tenderness.  Genitourinary:    Comments: External hemorrhoids, no bleeding or injury.  Musculoskeletal:     Cervical back: Normal range of motion and neck supple. No muscular tenderness.     Right lower leg: Edema present.     Left lower leg: Edema present.     Comments: 1+ edema BLE. W/c for mobility.   Skin:    General: Skin is warm and dry.  Comments: Chronic venous insufficiency skin changes BLE.   Neurological:     General: No focal deficit present.     Mental Status: He is alert. Mental status is at baseline.     Motor: No weakness.     Coordination: Coordination normal.     Gait: Gait abnormal.     Comments: Oriented to person, place. Right hand grip is weaker than the left, but muscle strength is 5/5  Psychiatric:        Mood and Affect: Mood normal.        Behavior: Behavior normal.        Thought Content: Thought content normal.    Labs reviewed: Recent Labs     01/12/20 0257 04/06/20 0000 09/04/20 0000 10/09/20 1607 11/14/20 0000  NA 134*   < > 139 139 137  K 3.7   < > 4.2 4.6 4.3  CL 95*   < > 102 104 102  CO2  --    < > 29* 28* 28*  GLUCOSE 131*  --   --   --   --   BUN 13   < > 11 15 17   CREATININE 0.90   < > 0.9 1.1 0.9  CALCIUM  --    < > 9.2 8.8 9.2   < > = values in this interval not displayed.   Recent Labs    04/06/20 0000 10/09/20 1607 11/14/20 0000  AST 16 16 18   ALT 16 14 20   ALKPHOS 66 54 63  ALBUMIN 3.4* 3.4* 3.9   Recent Labs    01/12/20 0250 01/12/20 0257 10/09/20 1607 11/14/20 0000  WBC 11.6*  --  8.9 11.5  NEUTROABS 10.0*  --  5,963.00 7,889.00  HGB 14.1 13.9 13.5 14.2  HCT 40.7 41.0 39* 43  MCV 93.3  --   --   --   PLT 174  --  236 231   Lab Results  Component Value Date   TSH 1.77 04/06/2020   No results found for: HGBA1C Lab Results  Component Value Date   CHOL 94 04/06/2020   HDL 37 04/06/2020   LDLCALC 41 04/06/2020   TRIG 79 04/06/2020   CHOLHDL 2.7 02/17/2017    Significant Diagnostic Results in last 30 days:  No results found.  Assessment/Plan Chronic diastolic heart failure (HCC) EF 44-96%, grade 1 diastolic dysfunction, off  Furosemide 40mg  qd, saw Cardiology  Hypertension blood pressure is controled on amlodipine 5mg  qd, Lisinopril 40mg  qd. Bun/creat 17/0.9 11/14/20  CAD (coronary artery disease) CAD, hyperlipidemia, on Crestor, ASA. LDL 41 04/06/20. 10/09/20 EKG SR vent rate 65, no significant ST-T changes.   GERD (gastroesophageal reflux disease) takes Omeprazole 20mg  qd. Hgb 14.2 11/14/20  Insomnia better  Lower back pain Stable, continue Gabapentin.   Osteoarthritis, multiple sites s/p left hip surgery, chronic right hip pain, w/c helps, prn Tylenol  Seizure ([place]) takes Keppra 500mg  bid. Last seen by neurology 05/09/20    Family/ staff Communication: plan of care reviewed with the patient and charge nurse.   Labs/tests ordered:  none  Time spend 35  minutes

## 2020-11-26 NOTE — Assessment & Plan Note (Signed)
better 

## 2020-11-26 NOTE — Assessment & Plan Note (Signed)
CAD, hyperlipidemia, on Crestor, ASA. LDL 41 04/06/20. 10/09/20 EKG SR vent rate 65, no significant ST-T changes.

## 2020-11-26 NOTE — Assessment & Plan Note (Signed)
takes Omeprazole 20mg  qd. Hgb 14.2 11/14/20

## 2020-11-26 NOTE — Assessment & Plan Note (Signed)
takes Keppra 500mg  bid. Last seen by neurology 05/09/20

## 2020-11-26 NOTE — Assessment & Plan Note (Signed)
s/p left hip surgery, chronic right hip pain, w/c helps, prn Tylenol

## 2020-11-26 NOTE — Assessment & Plan Note (Signed)
EF 62-95%, grade 1 diastolic dysfunction, off  Furosemide 40mg  qd, saw Cardiology

## 2020-11-26 NOTE — Assessment & Plan Note (Signed)
blood pressure is controled on amlodipine 5mg  qd, Lisinopril 40mg  qd. Bun/creat 17/0.9 11/14/20

## 2020-11-26 NOTE — Assessment & Plan Note (Signed)
Stable, continue Gabapentin.

## 2021-01-22 ENCOUNTER — Encounter: Payer: Self-pay | Admitting: Internal Medicine

## 2021-01-22 ENCOUNTER — Non-Acute Institutional Stay (SKILLED_NURSING_FACILITY): Payer: Medicare HMO | Admitting: Internal Medicine

## 2021-01-22 DIAGNOSIS — I480 Paroxysmal atrial fibrillation: Secondary | ICD-10-CM

## 2021-01-22 DIAGNOSIS — R569 Unspecified convulsions: Secondary | ICD-10-CM | POA: Diagnosis not present

## 2021-01-22 DIAGNOSIS — I2510 Atherosclerotic heart disease of native coronary artery without angina pectoris: Secondary | ICD-10-CM

## 2021-01-22 DIAGNOSIS — L03119 Cellulitis of unspecified part of limb: Secondary | ICD-10-CM

## 2021-01-22 DIAGNOSIS — I5032 Chronic diastolic (congestive) heart failure: Secondary | ICD-10-CM | POA: Diagnosis not present

## 2021-01-22 DIAGNOSIS — I10 Essential (primary) hypertension: Secondary | ICD-10-CM | POA: Diagnosis not present

## 2021-01-22 DIAGNOSIS — E782 Mixed hyperlipidemia: Secondary | ICD-10-CM | POA: Diagnosis not present

## 2021-01-22 NOTE — Progress Notes (Signed)
Location:   [facility] Room Number: 6 Place of Service:  SNF [phone]) Provider:  [name] MD   [name], MD  Patient Care Team: [name], MD as PCP - General (Internal Medicine) Gatha Mayer, MD as Consulting Physician (Gastroenterology) [name], [name], MD as Consulting Physician (Cardiology) [name], MD as Attending Physician (Internal Medicine) Mast, Man X, NP as Nurse Practitioner (Internal Medicine)  Extended Emergency Contact Information Primary Emergency Contact: Henrikson,[name] Address: [place], [place] [address] [name] of [place] Phone: [phone] Mobile Phone: [phone] Relation: Daughter Secondary Emergency Contact: Petraitis,[name] Address: [place] [address] [name] of [place] Phone: [phone] Mobile Phone: [phone] Relation: Son  Code Status:  DNR Managed Care Goals of care: Advanced Directive information Advanced Directives 01/22/2021  Does Patient Have a Medical Advance Directive? Yes  Type of Paramedic of [place];Living will  Does patient want to make changes to medical advance directive? No - Patient declined  Copy of [place] in Chart? Yes - validated most recent copy scanned in chart (See row information)  Pre-existing out of facility DNR order (yellow form or pink MOST form) -     Chief Complaint  Patient presents with   Medical Management of Chronic Issues   Health Maintenance    Shingrix and flu vaccine     HPI:  Pt is a 87 y.o. male seen today for medical management of chronic diseases.    Patient has a history of BPH, bradycardia,  CAD, hypertension, diastolic CHF,  GERD,H/o Left Sphenoid Meningioma And Chronic  Seizures  Recently his Aspirin dose increased due to ? TIA per patients request  He also has requested to be off Neurontin  Also he took  himself off Lasix due to Increased Urinary frequency And now has Bilateral Swelling with redness in both legs with Tenderness and discharge No Fever or Chills No SOB  His weight is up by 3 pounds  He wants to go and talk to his Kidney Doctors to see why he is having swelling He has already made Appointment with them  Otherwise continues to be stable Appetite is good Stays in his wheelchair No falls Cognitively doing well     Past Medical History:  Diagnosis Date   Allergic rhinitis    Amnesia    Atherosclerotic heart disease of native coronary artery without angina pectoris    BPH (benign prostatic hyperplasia)    Bradycardia    Chronic diastolic (congestive) heart failure (HCC)    Colon polyps    Coronary artery disease    MODERATE   Dizziness    Edema of lower extremity    Erectile dysfunction    H. pylori infection    Hx of    Hard of hearing    Headache(784.0)    History of colon polyps 2009   Dr. Orr/Colonoscopy -small cecal adenoma   History of falling    Hx of colonoscopy 2009   Dr Lajoyce Corners, hemorrhoids & polyps   Hyperlipidemia    Hypertension    Hypertensive heart disease with heart failure ([place])    Hypokalemia    Internal hemorrhoids 2009   Dr. Lajoyce Corners Colonoscopy   Metabolic encephalopathy    Mitral valve regurgitation    Muscle weakness (generalized)    Pneumonia  Primary generalized (osteo)arthritis    Reflux esophagitis    Sepsis ([place]) 11/04/2018   Unspecified hemorrhoids    Unsteadiness on feet    Vitamin D deficiency    Past Surgical History:  Procedure Laterality Date   APPENDECTOMY     CARDIAC CATHETERIZATION  1997   REVEALED MILD TO MODERATE IRREGULARITIES. HIS LEFT EVNTRICULAR SYSTOLIC FUNCTION REVEALED NORMAL EJECTION FRACTION WITH EF OF 70%   CATARACT EXTRACTION, BILATERAL     COLONOSCOPY  multiple   fatty tumor removal     benign   HEMORROIDECTOMY     HIP ARTHROPLASTY Left 08/17/2013   Procedure: ARTHROPLASTY BIPOLAR HIP;  Surgeon:  Gearlean Alf, MD;  Location: [facility];  Service: Orthopedics;  Laterality: Left;   INGUINAL HERNIA REPAIR Bilateral 2006   lower back surgery  2006   NASAL SEPTUM SURGERY     TONSILLECTOMY     UPPER GASTROINTESTINAL ENDOSCOPY      No Known Allergies  Allergies as of 01/22/2021   No Known Allergies      Medication List        Accurate as of January 22, 2021 12:24 PM. If you have any questions, ask your nurse or doctor.          STOP taking these medications    gabapentin 100 MG capsule Commonly known as: NEURONTIN Stopped by: [name], MD       TAKE these medications    acetaminophen 500 MG tablet Commonly known as: TYLENOL Take 500 mg by mouth as needed.   amLODipine 5 MG tablet Commonly known as: NORVASC Take 5 mg by mouth daily.   aspirin 325 MG tablet Take 325 mg by mouth daily.   Carboxymethylcellulose Sod PF 0.5 % Soln Place 1 drop into both eyes daily as needed (dry eyes).   furosemide 20 MG tablet Commonly known as: LASIX Take 20 mg by mouth daily.   hydrocortisone 2.5 % rectal cream Commonly known as: ANUSOL-HC Place 1 application rectally 2 (two) times daily as needed for anal itching or hemorrhoids (USE PERINEAL APPLICATOR). With perineal applicator   levETIRAcetam 500 MG tablet Commonly known as: KEPPRA Take 1 tablet (500 mg total) by mouth 2 (two) times daily.   lisinopril 40 MG tablet Commonly known as: ZESTRIL Take 40 mg by mouth daily.   METHOCARBAMOL PO Take 250 mg by mouth as needed. Take 250 mg by mouth in the morning and an additional 250 mg once a day as needed for muscle spasms   mupirocin ointment 2 % Commonly known as: BACTROBAN Apply 1 application topically 2 (two) times daily.   omeprazole 20 MG capsule Commonly known as: PRILOSEC Take 20 mg by mouth daily.   potassium chloride SA 20 MEQ tablet Commonly known as: KLOR-CON Take 20 mEq by mouth daily.   rosuvastatin 5 MG tablet Commonly known as: CRESTOR Take  5 mg by mouth daily.   Thera-M Tabs Take 1 tablet by mouth daily.   vitamin B-12 1000 MCG tablet Commonly known as: CYANOCOBALAMIN Take 1,000 mcg by mouth daily.        Review of Systems  Constitutional:  Positive for activity change and unexpected weight change.  HENT: Negative.    Respiratory: Negative.    Cardiovascular:  Positive for leg swelling.  Gastrointestinal: Negative.   Genitourinary:  Positive for frequency.  Musculoskeletal:  Positive for gait problem.  Skin:  Positive for color change and rash.  Neurological:  Negative for dizziness.  Psychiatric/Behavioral: Negative.  Immunization History  Administered Date(s) Administered   Influenza, High Dose Seasonal PF 03/19/2019, 03/19/2019   Influenza-Unspecified 04/08/2016, 03/27/2020   Moderna Sars-Covid-2 Vaccination 06/18/2019, 07/16/2019, 04/24/2020, 11/13/2020   Pneumococcal Conjugate-13 06/02/2013   Pneumococcal Polysaccharide-23 04/07/2001   Pertinent  Health Maintenance Due  Topic Date Due   INFLUENZA VACCINE  01/14/2021   PNA vac Low Risk Adult  Completed   Fall Risk  11/03/2017 07/11/2016  Falls in the past year? Yes Yes  Number falls in past yr: 2 or more 2 or more  Injury with Fall? Yes Yes  Comment - broke toe on right foot  Risk Factor Category  High Fall Risk -  Follow up Education provided;Falls prevention discussed -   Functional Status Survey:    Vitals:   01/22/21 1203  BP: (!) 149/70  Pulse: (!) 57  Resp: 18  Temp: (!) 97.5 F (36.4 C)  SpO2: 97%  Weight: 178 lb 4.8 oz (80.9 kg)  Height: '5\' 7"'$  (1.702 m)   Body mass index is 27.93 kg/m. Physical Exam Constitutional: Oriented to person, place, and time. Well-developed and well-nourished.  HENT:  Head: Normocephalic.  Mouth/Throat: Oropharynx is clear and moist.  Eyes: Pupils are equal, round, and reactive to light.  Neck: Neck supple.  Cardiovascular: Normal rate and normal heart sounds.Regular Today   No murmur  heard. Pulmonary/Chest: Effort normal and breath sounds normal. No respiratory distress. No wheezes. he has no rales.  Abdominal: Soft. Bowel sounds are normal. No distension. There is no tenderness. There is no rebound.  Musculoskeletal: has Edema Bilateral with Redness and Tenderness in both legs Also warm  Lymphadenopathy: none Neurological: Alert and oriented to person, place, and time.  Skin: Skin is warm and dry.  Psychiatric: Normal mood and affect. Behavior is normal. Thought content normal.   Labs reviewed: Recent Labs    09/04/20 0000 10/09/20 1607 11/14/20 0000  NA 139 139 137  K 4.2 4.6 4.3  CL 102 104 102  CO2 29* 28* 28*  BUN '11 15 17  '$ CREATININE 0.9 1.1 0.9  CALCIUM 9.2 8.8 9.2   Recent Labs    04/06/20 0000 10/09/20 1607 11/14/20 0000  AST '16 16 18  '$ ALT '16 14 20  '$ ALKPHOS 66 54 63  ALBUMIN 3.4* 3.4* 3.9   Recent Labs    10/09/20 1607 11/14/20 0000  WBC 8.9 11.5  NEUTROABS 5,963.00 7,889.00  HGB 13.5 14.2  HCT 39* 43  PLT 236 231   Lab Results  Component Value Date   TSH 1.77 04/06/2020   No results found for: HGBA1C Lab Results  Component Value Date   CHOL 94 04/06/2020   HDL 37 04/06/2020   LDLCALC 41 04/06/2020   TRIG 79 04/06/2020   CHOLHDL 2.7 02/17/2017    Significant Diagnostic Results in last 30 days:  No results found.  Assessment/Plan Cellulitis of lower extremity, unspecified laterality Doxycyline 100 mg BID for 7 days Chronic diastolic heart failure (HCC) Change Lasix to 40 mg QD for 3 days and then back to 20 mg   Primary hypertension On Lisinopril and Norvasc  CAD On Aspirin , Statin   PAF (paroxysmal atrial fibrillation) (HCC) On Aspirin only Dose was increased per Patient due to ? TIA Not on Eliquis due to age and Frailty and Falls On Prilosec Seizure ([place]) On Keppra Mixed hyperlipidemia On Crestor Repeat Panel   Family/ staff Communication:   Labs/tests ordered:  BMP and Lipid panel   Total time  spent in  this patient care encounter was  _45  minutes; greater than 50% of the visit spent counseling patient and staff, reviewing records , Labs and coordinating care for problems addressed at this encounter.

## 2021-01-24 ENCOUNTER — Encounter: Payer: Self-pay | Admitting: Nurse Practitioner

## 2021-01-24 NOTE — Progress Notes (Signed)
This encounter was created in error - please disregard.

## 2021-01-28 ENCOUNTER — Non-Acute Institutional Stay (SKILLED_NURSING_FACILITY): Payer: Medicare HMO | Admitting: Orthopedic Surgery

## 2021-01-28 ENCOUNTER — Encounter: Payer: Self-pay | Admitting: Orthopedic Surgery

## 2021-01-28 DIAGNOSIS — I480 Paroxysmal atrial fibrillation: Secondary | ICD-10-CM | POA: Diagnosis not present

## 2021-01-28 DIAGNOSIS — L03119 Cellulitis of unspecified part of limb: Secondary | ICD-10-CM

## 2021-01-28 DIAGNOSIS — I5032 Chronic diastolic (congestive) heart failure: Secondary | ICD-10-CM

## 2021-01-28 DIAGNOSIS — I10 Essential (primary) hypertension: Secondary | ICD-10-CM

## 2021-01-28 DIAGNOSIS — I2510 Atherosclerotic heart disease of native coronary artery without angina pectoris: Secondary | ICD-10-CM

## 2021-01-28 DIAGNOSIS — R569 Unspecified convulsions: Secondary | ICD-10-CM

## 2021-01-28 DIAGNOSIS — K219 Gastro-esophageal reflux disease without esophagitis: Secondary | ICD-10-CM

## 2021-01-28 MED ORDER — CEFTRIAXONE SODIUM 1 G IJ SOLR: 1.0000 g | INTRAMUSCULAR | 0 refills | Status: AC

## 2021-01-28 NOTE — Progress Notes (Signed)
Location:  [facility] Room Number: [place] of Service:  SNF [phone]) Provider:  Windell Moulding, AGNP-C  [name], MD  Patient Care Team: [name], MD as PCP - General (Internal Medicine) Gatha Mayer, MD as Consulting Physician (Gastroenterology) [name], [name], MD as Consulting Physician (Cardiology) [name], MD as Attending Physician (Internal Medicine) Mast, Man X, NP as Nurse Practitioner (Internal Medicine)  Extended Emergency Contact Information Primary Emergency Contact: Sadowsky,[name] Address: [place], [place] [address] [name] of [place] Phone: [phone] Mobile Phone: [phone] Relation: Daughter Secondary Emergency Contact: Longhi,[name] Address: [place] [address] [name] of [place] Phone: [phone] Mobile Phone: [phone] Relation: Son  Code Status:  DNR Goals of care: Advanced Directive information Advanced Directives 01/24/2021  Does Patient Have a Medical Advance Directive? Yes  Type of Paramedic of [place];Living will  Does patient want to make changes to medical advance directive? No - Patient declined  Copy of [place] in Chart? Yes - validated most recent copy scanned in chart (See row information)  Pre-existing out of facility DNR order (yellow form or pink MOST form) -     Chief Complaint  Patient presents with   Acute Visit    HPI:  Pt is a 87 y.o. male seen today for unresolved cellulitis.   He currently resides on the skilled nursing unit at [facility]. Past medical history includes: CAD, chronic diastolic heart failure, HTN, atrial fib, venous insufficiency, GERD, meningioma, seizures, OA, BPH, anemia, and gait abnormality.   08/09 he was seen by Dr. Lyndel Safe for bilateral lower leg edema and cellulitis. He had recently taken himself off lasix due to  increased urinary frequency. He was started on doxycycline 100 mg bid x 7 days. His lasix was increased to 40 mg for 3 days and then advised to resume lasix 20 mg daily. Today, nursing reports purulent drainage to lesion on right lower leg, odor also noted. He also reports increased pain within the past day to BLE, rated 3/10, described as burning.   BLE edema- increased swelling over past 2 weeks, was off lasix for a period of time per patient request, weight increased by 3 lbs.  HTN- BUN/creat 17/0.9 11/14/2020, Norvasc 5 mg daily, lisinopril 40 mg daily CHF- increased leg swelling today, lasix 20 mg daily, potassium 20 meq daily A-fib- rate controlled without medication, ASA 81 mg daily GERD- denies reflux, Prilosec 20 mg daily, Hgb 14.2 11/14/2020 CAD- remains on ASA 81 mg daily Meningioma- MRI 09/19/2019 revealed chronic vasogenic edema in left temporal lobe correlating with middle cranial fossa meningioma in 2017, keppra 500 mg bid, no recent seizures  Remains in wheelchair most of day.   No recent falls or injuries.   Nurse does not report any other concerns, vitals stable.        Past Medical History:  Diagnosis Date   Allergic rhinitis    Amnesia    Atherosclerotic heart disease of native coronary artery without angina pectoris    BPH (benign prostatic hyperplasia)    Bradycardia    Chronic diastolic (congestive) heart failure (HCC)    Colon polyps    Coronary artery disease    MODERATE   Dizziness    Edema of lower extremity    Erectile dysfunction    H.  pylori infection    Hx of    Hard of hearing    Headache(784.0)    History of colon polyps 2009   Dr. Orr/Colonoscopy -small cecal adenoma   History of falling    Hx of colonoscopy 2009   Dr Lajoyce Corners, hemorrhoids & polyps   Hyperlipidemia    Hypertension    Hypertensive heart disease with heart failure [facility])    Hypokalemia    Internal hemorrhoids 2009   Dr. Lajoyce Corners Colonoscopy   Metabolic encephalopathy    Mitral  valve regurgitation    Muscle weakness (generalized)    Pneumonia    Primary generalized (osteo)arthritis    Reflux esophagitis    Sepsis ([place]) 11/04/2018   Unspecified hemorrhoids    Unsteadiness on feet    Vitamin D deficiency    Past Surgical History:  Procedure Laterality Date   APPENDECTOMY     CARDIAC CATHETERIZATION  1997   REVEALED MILD TO MODERATE IRREGULARITIES. HIS LEFT EVNTRICULAR SYSTOLIC FUNCTION REVEALED NORMAL EJECTION FRACTION WITH EF OF 70%   CATARACT EXTRACTION, BILATERAL     COLONOSCOPY  multiple   fatty tumor removal     benign   HEMORROIDECTOMY     HIP ARTHROPLASTY Left 08/17/2013   Procedure: ARTHROPLASTY BIPOLAR HIP;  Surgeon: Gearlean Alf, MD;  Location: [facility];  Service: Orthopedics;  Laterality: Left;   INGUINAL HERNIA REPAIR Bilateral 2006   lower back surgery  2006   NASAL SEPTUM SURGERY     TONSILLECTOMY     UPPER GASTROINTESTINAL ENDOSCOPY      No Known Allergies  Outpatient Encounter Medications as of 01/28/2021  Medication Sig   acetaminophen (TYLENOL) 500 MG tablet Take 500 mg by mouth as needed.   amLODipine (NORVASC) 5 MG tablet Take 5 mg by mouth daily.   aspirin 325 MG tablet Take 325 mg by mouth daily.   doxycycline (MONODOX) 100 MG capsule Take 100 mg by mouth 2 (two) times daily.   furosemide (LASIX) 20 MG tablet Take 20 mg by mouth daily.   furosemide (LASIX) 40 MG tablet Take 40 mg by mouth daily.   hydrocortisone (ANUSOL-HC) 2.5 % rectal cream Place 1 application rectally 2 (two) times daily as needed for anal itching or hemorrhoids (USE PERINEAL APPLICATOR). With perineal applicator   levETIRAcetam (KEPPRA) 500 MG tablet Take 1 tablet (500 mg total) by mouth 2 (two) times daily.   lisinopril (ZESTRIL) 40 MG tablet Take 40 mg by mouth daily.   METHOCARBAMOL PO Take 250 mg by mouth as needed. Take 250 mg by mouth in the morning and an additional 250 mg once a day as needed for muscle spasms   Multiple Vitamins-Minerals (THERA-M)  TABS Take 1 tablet by mouth daily.   mupirocin ointment (BACTROBAN) 2 % Apply 1 application topically 2 (two) times daily.   omeprazole (PRILOSEC) 20 MG capsule Take 20 mg by mouth daily.   potassium chloride SA (KLOR-CON) 20 MEQ tablet Take 20 mEq by mouth daily.   rosuvastatin (CRESTOR) 5 MG tablet Take 5 mg by mouth daily.    saccharomyces boulardii (FLORASTOR) 250 MG capsule Take 250 mg by mouth 2 (two) times daily.   vitamin B-12 (CYANOCOBALAMIN) 1000 MCG tablet Take 1,000 mcg by mouth daily.   No facility-administered encounter medications on file as of 01/28/2021.    Review of Systems  Constitutional:  Negative for activity change, appetite change, fatigue and fever.  HENT: Negative.    Eyes: Negative.   Respiratory:  Negative for  cough, shortness of breath and wheezing.   Cardiovascular:  Positive for leg swelling. Negative for chest pain.  Gastrointestinal: Negative.   Genitourinary:  Positive for frequency. Negative for dysuria and hematuria.  Musculoskeletal:  Positive for arthralgias, back pain, gait problem and myalgias.  Skin:  Positive for rash and wound.  Neurological:  Positive for weakness. Negative for dizziness and headaches.  Psychiatric/Behavioral:  Negative for confusion and dysphoric mood. The patient is not nervous/anxious.    Immunization History  Administered Date(s) Administered   Influenza, High Dose Seasonal PF 03/19/2019, 03/19/2019   Influenza-Unspecified 04/08/2016, 03/27/2020   Moderna Sars-Covid-2 Vaccination 06/18/2019, 07/16/2019, 04/24/2020, 11/13/2020   Pneumococcal Conjugate-13 06/02/2013   Pneumococcal Polysaccharide-23 04/07/2001   Pertinent  Health Maintenance Due  Topic Date Due   INFLUENZA VACCINE  01/14/2021   PNA vac Low Risk Adult  Completed   Fall Risk  11/03/2017 07/11/2016  Falls in the past year? Yes Yes  Number falls in past yr: 2 or more 2 or more  Injury with Fall? Yes Yes  Comment - broke toe on right foot  Risk Factor  Category  High Fall Risk -  Follow up Education provided;Falls prevention discussed -   Functional Status Survey:    Vitals:   01/28/21 1335  BP: (!) 160/80  Pulse: 66  Resp: 18  Temp: (!) 97.1 F (36.2 C)  SpO2: 96%  Weight: 178 lb 4.8 oz (80.9 kg)  Height: '5\' 7"'$  (1.702 m)   Body mass index is 27.93 kg/m. Physical Exam Vitals reviewed.  Constitutional:      General: He is not in acute distress. HENT:     Head: Normocephalic.     Mouth/Throat:     Mouth: Mucous membranes are moist.  Eyes:     General:        Right eye: No discharge.        Left eye: No discharge.  Cardiovascular:     Rate and Rhythm: Normal rate. Rhythm irregular.     Pulses: Normal pulses.     Heart sounds: Normal heart sounds. No murmur heard. Pulmonary:     Effort: Pulmonary effort is normal. No respiratory distress.     Breath sounds: Normal breath sounds. No wheezing.  Abdominal:     General: Bowel sounds are normal. There is no distension.     Palpations: Abdomen is soft.     Tenderness: There is no abdominal tenderness.  Musculoskeletal:     Right lower leg: Edema present.     Left lower leg: Edema present.     Comments: 2+ pitting  Skin:    Comments: Dime sized lesion to right calf, drainage purulent, odor noted. Surrounding skin red and tender.  Neurological:     Mental Status: He is alert.    Labs reviewed: Recent Labs    09/04/20 0000 10/09/20 1607 11/14/20 0000  NA 139 139 137  K 4.2 4.6 4.3  CL 102 104 102  CO2 29* 28* 28*  BUN '11 15 17  '$ CREATININE 0.9 1.1 0.9  CALCIUM 9.2 8.8 9.2   Recent Labs    04/06/20 0000 10/09/20 1607 11/14/20 0000  AST '16 16 18  '$ ALT '16 14 20  '$ ALKPHOS 66 54 63  ALBUMIN 3.4* 3.4* 3.9   Recent Labs    10/09/20 1607 11/14/20 0000  WBC 8.9 11.5  NEUTROABS 5,963.00 7,889.00  HGB 13.5 14.2  HCT 39* 43  PLT 236 231   Lab Results  Component Value Date  TSH 1.77 04/06/2020   No results found for: HGBA1C Lab Results  Component  Value Date   CHOL 94 04/06/2020   HDL 37 04/06/2020   LDLCALC 41 04/06/2020   TRIG 79 04/06/2020   CHOLHDL 2.7 02/17/2017    Significant Diagnostic Results in last 30 days:  No results found.  Assessment/Plan 1. Cellulitis of lower extremity, unspecified laterality - lesion to right calf with purulent drainage and odor - extend doxycycline 100 mg po bid for another 7 days - rocephin 1g IM x 3 days - cont daily dressing changes  2. Chronic diastolic heart failure (HCC) - no recent weight fluctuations or sob, BLE 2+ - will increase lasix to 40 mg daily - cont potassium 20 meq daily - advised to elevate legs 3-4 hours daily  3. Primary hypertension - controlled - cont Norvasc and lisinopril  4. Coronary artery disease involving native coronary artery of native heart without angina pectoris - cont ASA   5. PAF (paroxysmal atrial fibrillation) (HCC) - rate controlled without medications - cont ASA  6. Seizure (HCC) - no recent seizures - cont keppra 500 mg bid  7. Gastroesophageal reflux disease, unspecified whether esophagitis present - denies reflux - cont Prilosec 20 mg daily    Family/ staff Communication: plan discussed with patient and nurse  Labs/tests ordered:  none

## 2021-02-04 ENCOUNTER — Encounter: Payer: Self-pay | Admitting: Nurse Practitioner

## 2021-02-04 ENCOUNTER — Non-Acute Institutional Stay (SKILLED_NURSING_FACILITY): Payer: Medicare HMO | Admitting: Nurse Practitioner

## 2021-02-04 DIAGNOSIS — M150 Primary generalized (osteo)arthritis: Secondary | ICD-10-CM

## 2021-02-04 DIAGNOSIS — K219 Gastro-esophageal reflux disease without esophagitis: Secondary | ICD-10-CM

## 2021-02-04 DIAGNOSIS — N401 Enlarged prostate with lower urinary tract symptoms: Secondary | ICD-10-CM | POA: Diagnosis not present

## 2021-02-04 DIAGNOSIS — I5032 Chronic diastolic (congestive) heart failure: Secondary | ICD-10-CM | POA: Diagnosis not present

## 2021-02-04 DIAGNOSIS — I2510 Atherosclerotic heart disease of native coronary artery without angina pectoris: Secondary | ICD-10-CM | POA: Diagnosis not present

## 2021-02-04 DIAGNOSIS — I10 Essential (primary) hypertension: Secondary | ICD-10-CM | POA: Diagnosis not present

## 2021-02-04 DIAGNOSIS — R569 Unspecified convulsions: Secondary | ICD-10-CM

## 2021-02-04 DIAGNOSIS — I872 Venous insufficiency (chronic) (peripheral): Secondary | ICD-10-CM | POA: Diagnosis not present

## 2021-02-04 DIAGNOSIS — R296 Repeated falls: Secondary | ICD-10-CM | POA: Diagnosis not present

## 2021-02-04 DIAGNOSIS — M8949 Other hypertrophic osteoarthropathy, multiple sites: Secondary | ICD-10-CM

## 2021-02-04 DIAGNOSIS — R69 Illness, unspecified: Secondary | ICD-10-CM | POA: Diagnosis not present

## 2021-02-04 DIAGNOSIS — I4891 Unspecified atrial fibrillation: Secondary | ICD-10-CM

## 2021-02-04 DIAGNOSIS — F5101 Primary insomnia: Secondary | ICD-10-CM

## 2021-02-04 DIAGNOSIS — Z8673 Personal history of transient ischemic attack (TIA), and cerebral infarction without residual deficits: Secondary | ICD-10-CM | POA: Diagnosis not present

## 2021-02-04 DIAGNOSIS — N138 Other obstructive and reflux uropathy: Secondary | ICD-10-CM

## 2021-02-04 DIAGNOSIS — M159 Polyosteoarthritis, unspecified: Secondary | ICD-10-CM

## 2021-02-04 DIAGNOSIS — M5440 Lumbago with sciatica, unspecified side: Secondary | ICD-10-CM

## 2021-02-04 NOTE — Assessment & Plan Note (Signed)
resolved right hand numbness, weakness, difficulty feeding self, changed to ASA 325mg  qd from 81mg  11/13/20

## 2021-02-04 NOTE — Assessment & Plan Note (Signed)
akes Omeprazole '20mg'$  qd. Hgb 14.2 11/14/20

## 2021-02-04 NOTE — Assessment & Plan Note (Signed)
onset 09/2019, heart rate is conrolled, not a candidate for anticoagulation,

## 2021-02-04 NOTE — Progress Notes (Signed)
Location:   [facility] Room Number: [place] of Service:  SNF (31) Provider:  Lynasia Meloche Otho Darner, NP  [name], MD  Patient Care Team: [name], MD as PCP - General (Internal Medicine) Gatha Mayer, MD as Consulting Physician (Gastroenterology) [name], [name], MD as Consulting Physician (Cardiology) [name], MD as Attending Physician (Internal Medicine) Charmane Protzman X, NP as Nurse Practitioner (Internal Medicine)  Extended Emergency Contact Information Primary Emergency Contact: Biscoe,[name] Address: [place], [place] [address] [name] of [place] Phone: [phone] Mobile Phone: [phone] Relation: Daughter Secondary Emergency Contact: Kelleher,[name] Address: [place] [address] [name] of [place] Phone: [phone] Mobile Phone: [phone] Relation: Son  Code Status:  DNR Goals of care: Advanced Directive information Advanced Directives 02/04/2021  Does Patient Have a Medical Advance Directive? Yes  Type of Paramedic of [place];Living will  Does patient want to make changes to medical advance directive? No - Patient declined  Copy of [place] in Chart? Yes - validated most recent copy scanned in chart (See row information)  Pre-existing out of facility DNR order (yellow form or pink MOST form) -     Chief Complaint  Patient presents with   Acute Visit    Patient complains of chest pain    HPI:  Pt is a 87 y.o. male seen today for an acute visit for c/o tightness in his chest upon up in am, NTG x2 at 7:15am, 7:30 am with relief. No further occurrence. Denied SOB, cough, or indigestion.   TIA, resolved right hand numbness, weakness, difficulty feeding self, changed to ASA '325mg'$  qd from '81mg'$  11/13/20             Recurrent falls, due to unsteady on his legs, attempting standing up or transfer with  no assistance.              BPH, saw urology 08/02/20               PVD, BLE brownish pigmentation, mild erythematous shins.              Afib, onset 09/2019, heart rate is conrolled, not a candidate for anticoagulation,             CHF, EF Q000111Q, grade 1 diastolic dysfunction, on Furosemide, saw Cardiology             HTN, blood pressure is controled on amlodipine '5mg'$  qd, Lisinopril '40mg'$  qd. Bun/creat 17/0.9 11/14/20             Hx of CAD, hyperlipidemia, on Crestor, ASA. LDL 41 04/06/20. 10/09/20 EKG SR vent rate 65, no significant ST-T changes.              GERD, takes Omeprazole '20mg'$  qd. Hgb 14.2 11/14/20             Insomnia, stable, off Trazodone.              Chronic lower back pain/right hip pain,  on Gabapentin '200mg'$  qd             OA, s/p left hip surgery, chronic right hip pain, w/c helps, prn Tylenol             Seizure: takes Keppra '500mg'$  bid. Last seen by neurology 05/09/20    Past Medical  History:  Diagnosis Date   Allergic rhinitis    Amnesia    Atherosclerotic heart disease of native coronary artery without angina pectoris    BPH (benign prostatic hyperplasia)    Bradycardia    Chronic diastolic (congestive) heart failure (HCC)    Colon polyps    Coronary artery disease    MODERATE   Dizziness    Edema of lower extremity    Erectile dysfunction    H. pylori infection    Hx of    Hard of hearing    Headache(784.0)    History of colon polyps 2009   Dr. Orr/Colonoscopy -small cecal adenoma   History of falling    Hx of colonoscopy 2009   Dr Lajoyce Corners, hemorrhoids & polyps   Hyperlipidemia    Hypertension    Hypertensive heart disease with heart failure ([place])    Hypokalemia    Internal hemorrhoids 2009   Dr. Lajoyce Corners Colonoscopy   Metabolic encephalopathy    Mitral valve regurgitation    Muscle weakness (generalized)    Pneumonia    Primary generalized (osteo)arthritis    Reflux esophagitis    Sepsis ([place]) 11/04/2018   Unspecified hemorrhoids    Unsteadiness on feet     Vitamin D deficiency    Past Surgical History:  Procedure Laterality Date   APPENDECTOMY     CARDIAC CATHETERIZATION  1997   REVEALED MILD TO MODERATE IRREGULARITIES. HIS LEFT EVNTRICULAR SYSTOLIC FUNCTION REVEALED NORMAL EJECTION FRACTION WITH EF OF 70%   CATARACT EXTRACTION, BILATERAL     COLONOSCOPY  multiple   fatty tumor removal     benign   HEMORROIDECTOMY     HIP ARTHROPLASTY Left 08/17/2013   Procedure: ARTHROPLASTY BIPOLAR HIP;  Surgeon: Gearlean Alf, MD;  Location: [facility];  Service: Orthopedics;  Laterality: Left;   INGUINAL HERNIA REPAIR Bilateral 2006   lower back surgery  2006   NASAL SEPTUM SURGERY     TONSILLECTOMY     UPPER GASTROINTESTINAL ENDOSCOPY      No Known Allergies  Allergies as of 02/04/2021   No Known Allergies      Medication List        Accurate as of February 04, 2021 11:59 PM. If you have any questions, ask your nurse or doctor.          acetaminophen 500 MG tablet Commonly known as: TYLENOL Take 500 mg by mouth as needed.   amLODipine 5 MG tablet Commonly known as: NORVASC Take 5 mg by mouth daily.   aspirin 325 MG tablet Take 325 mg by mouth daily.   doxycycline 100 MG capsule Commonly known as: MONODOX Take 100 mg by mouth 2 (two) times daily.   furosemide 40 MG tablet Commonly known as: LASIX Take 40 mg by mouth daily.   hydrocortisone 2.5 % rectal cream Commonly known as: ANUSOL-HC Place 1 application rectally 2 (two) times daily as needed for anal itching or hemorrhoids (USE PERINEAL APPLICATOR). With perineal applicator   levETIRAcetam 500 MG tablet Commonly known as: KEPPRA Take 1 tablet (500 mg total) by mouth 2 (two) times daily.   lisinopril 40 MG tablet Commonly known as: ZESTRIL Take 40 mg by mouth daily.   METHOCARBAMOL PO Take 250 mg by mouth as needed. Take 250 mg by mouth in the morning and an additional 250 mg once a day as needed for muscle spasms   mupirocin ointment 2 % Commonly known as:  BACTROBAN Apply 1 application topically 2 (two) times  daily.   nitroGLYCERIN 0.4 MG SL tablet Commonly known as: NITROSTAT Place 0.4 mg under the tongue as needed for chest pain.   omeprazole 20 MG capsule Commonly known as: PRILOSEC Take 20 mg by mouth daily.   potassium chloride SA 20 MEQ tablet Commonly known as: KLOR-CON Take 20 mEq by mouth daily.   rosuvastatin 5 MG tablet Commonly known as: CRESTOR Take 5 mg by mouth daily.   saccharomyces boulardii 250 MG capsule Commonly known as: FLORASTOR Take 250 mg by mouth 2 (two) times daily.   Thera-M Tabs Take 1 tablet by mouth daily.   vitamin B-12 1000 MCG tablet Commonly known as: CYANOCOBALAMIN Take 1,000 mcg by mouth daily.        Review of Systems  Constitutional:  Negative for activity change, appetite change and fever.  HENT:  Positive for hearing loss. Negative for congestion and voice change.   Eyes:  Negative for visual disturbance.  Respiratory:  Positive for chest tightness. Negative for cough, shortness of breath and wheezing.   Cardiovascular:  Positive for leg swelling.  Gastrointestinal:  Negative for abdominal pain and constipation.  Genitourinary:  Positive for frequency. Negative for difficulty urinating and urgency.  Musculoskeletal:  Positive for arthralgias, back pain and gait problem.       Lower back, right hip/thigh pain   Skin:  Negative for color change.       Chronic venous insufficiency skin changes BLE  Neurological:  Negative for seizures, weakness and headaches.       Memory lapses. Tingling in toes. Right hand weakness/numbness is improved, placed ASA 325 11/13/20  Psychiatric/Behavioral:  Negative for behavioral problems and sleep disturbance. The patient is not nervous/anxious.    Immunization History  Administered Date(s) Administered   Influenza, High Dose Seasonal PF 03/19/2019, 03/19/2019   Influenza-Unspecified 04/08/2016, 03/27/2020   Moderna Sars-Covid-2 Vaccination  06/18/2019, 07/16/2019, 04/24/2020, 11/13/2020   Pneumococcal Conjugate-13 06/02/2013   Pneumococcal Polysaccharide-23 04/07/2001   Pertinent  Health Maintenance Due  Topic Date Due   INFLUENZA VACCINE  01/14/2021   PNA vac Low Risk Adult  Completed   Fall Risk  11/03/2017 07/11/2016  Falls in the past year? Yes Yes  Number falls in past yr: 2 or more 2 or more  Injury with Fall? Yes Yes  Comment - broke toe on right foot  Risk Factor Category  High Fall Risk -  Follow up Education provided;Falls prevention discussed -   Functional Status Survey:    Vitals:   02/04/21 1458  BP: (!) 160/92  Pulse: 66  Resp: (!) 22  Temp: 97.7 F (36.5 C)  SpO2: 95%  Weight: 178 lb 4.8 oz (80.9 kg)  Height: '5\' 7"'$  (1.702 m)   Body mass index is 27.93 kg/m. Physical Exam Vitals and nursing note reviewed.  Constitutional:      Appearance: Normal appearance.  HENT:     Head: Normocephalic and atraumatic.     Mouth/Throat:     Mouth: Mucous membranes are moist.  Eyes:     Conjunctiva/sclera: Conjunctivae normal.     Pupils: Pupils are equal, round, and reactive to light.  Cardiovascular:     Rate and Rhythm: Normal rate and regular rhythm.     Heart sounds: Murmur heard.  Pulmonary:     Breath sounds: No rales.  Abdominal:     General: Bowel sounds are normal.     Palpations: Abdomen is soft.     Tenderness: There is no abdominal tenderness.  Genitourinary:  Comments: External hemorrhoids, no bleeding or injury.  Musculoskeletal:     Cervical back: Normal range of motion and neck supple. No muscular tenderness.     Right lower leg: Edema present.     Left lower leg: Edema present.     Comments: 1+ edema BLE. W/c for mobility.   Skin:    General: Skin is warm and dry.     Findings: Erythema present.     Comments: Chronic venous insufficiency skin changes BLE.   Neurological:     General: No focal deficit present.     Mental Status: He is alert. Mental status is at baseline.      Motor: No weakness.     Coordination: Coordination normal.     Gait: Gait abnormal.     Comments: Oriented to person, place.   Psychiatric:        Mood and Affect: Mood normal.        Behavior: Behavior normal.        Thought Content: Thought content normal.    Labs reviewed: Recent Labs    09/04/20 0000 10/09/20 1607 11/14/20 0000  NA 139 139 137  K 4.2 4.6 4.3  CL 102 104 102  CO2 29* 28* 28*  BUN '11 15 17  '$ CREATININE 0.9 1.1 0.9  CALCIUM 9.2 8.8 9.2   Recent Labs    04/06/20 0000 10/09/20 1607 11/14/20 0000  AST '16 16 18  '$ ALT '16 14 20  '$ ALKPHOS 66 54 63  ALBUMIN 3.4* 3.4* 3.9   Recent Labs    10/09/20 1607 11/14/20 0000  WBC 8.9 11.5  NEUTROABS 5,963.00 7,889.00  HGB 13.5 14.2  HCT 39* 43  PLT 236 231   Lab Results  Component Value Date   TSH 1.77 04/06/2020   No results found for: HGBA1C Lab Results  Component Value Date   CHOL 94 04/06/2020   HDL 37 04/06/2020   LDLCALC 41 04/06/2020   TRIG 79 04/06/2020   CHOLHDL 2.7 02/17/2017    Significant Diagnostic Results in last 30 days:  No results found.  Assessment/Plan CAD (coronary artery disease) Hx of CAD, hyperlipidemia, on Crestor, ASA. LDL 41 04/06/20. 10/09/20 EKG SR vent rate 65, no significant ST-T changes.  Tightness in his chest upon up in am, NTG x2 at 7:15am, 7:30 am with relief. No further occurrence. Denied SOB, cough, or indigestion. Continue prn NTG  History of TIA (transient ischemic attack) resolved right hand numbness, weakness, difficulty feeding self, changed to ASA '325mg'$  qd from '81mg'$  11/13/20  Recurrent falls Recurrent falls, due to unsteady on his legs, attempting standing up or transfer with no assistance.   BPH (benign prostatic hyperplasia) No urinary retention, saw urology.   Venous insufficiency (chronic) (peripheral) BLE brownish pigmentation, mild erythematous shins.  New onset atrial fibrillation ([place]) onset 09/2019, heart rate is conrolled, not a candidate  for anticoagulation,  Chronic diastolic heart failure (HCC) EF Q000111Q, grade 1 diastolic dysfunction, on Furosemide, saw Cardiology  Hypertension Mildly elevated, continue amlodipine '5mg'$  qd, Lisinopril '40mg'$  qd. Bun/creat 17/0.9 11/14/20  GERD (gastroesophageal reflux disease) akes Omeprazole '20mg'$  qd. Hgb 14.2 11/14/20  Insomnia Stable.   Lower back pain Chronic lower back pain/right hip pain,  on Gabapentin '200mg'$  qd  Osteoarthritis, multiple sites s/p left hip surgery, chronic right hip pain, w/c helps, prn Tylenol  Seizure (HCC) Stable,  takes Keppra '500mg'$  bid. Last seen by neurology 05/09/20    Family/ staff Communication: plan of care reviewed with the patient and  charge nurse.   Labs/tests ordered:  none  Time spend 35 minutes.

## 2021-02-04 NOTE — Assessment & Plan Note (Signed)
BLE brownish pigmentation, mild erythematous shins.

## 2021-02-04 NOTE — Assessment & Plan Note (Signed)
s/p left hip surgery, chronic right hip pain, w/c helps, prn Tylenol

## 2021-02-04 NOTE — Assessment & Plan Note (Signed)
Stable,  takes Keppra '500mg'$  bid. Last seen by neurology 05/09/20

## 2021-02-04 NOTE — Assessment & Plan Note (Signed)
Stable

## 2021-02-04 NOTE — Assessment & Plan Note (Signed)
EF 34-03%, grade 1 diastolic dysfunction, on Furosemide, saw Cardiology

## 2021-02-04 NOTE — Assessment & Plan Note (Signed)
Recurrent falls, due to unsteady on his legs, attempting standing up or transfer with no assistance.

## 2021-02-04 NOTE — Assessment & Plan Note (Signed)
Hx of CAD, hyperlipidemia, on Crestor, ASA. LDL 41 04/06/20. 10/09/20 EKG SR vent rate 65, no significant ST-T changes.  Tightness in his chest upon up in am, NTG x2 at 7:15am, 7:30 am with relief. No further occurrence. Denied SOB, cough, or indigestion. Continue prn NTG

## 2021-02-04 NOTE — Assessment & Plan Note (Signed)
No urinary retention, saw urology.

## 2021-02-04 NOTE — Assessment & Plan Note (Signed)
Mildly elevated, continue amlodipine '5mg'$  qd, Lisinopril '40mg'$  qd. Bun/creat 17/0.9 11/14/20

## 2021-02-04 NOTE — Assessment & Plan Note (Signed)
Chronic lower back pain/right hip pain,  on Gabapentin '200mg'$  qd

## 2021-02-05 DIAGNOSIS — E876 Hypokalemia: Secondary | ICD-10-CM | POA: Diagnosis not present

## 2021-02-05 DIAGNOSIS — I10 Essential (primary) hypertension: Secondary | ICD-10-CM | POA: Diagnosis not present

## 2021-02-05 LAB — BASIC METABOLIC PANEL
BUN: 14 (ref 4–21)
CO2: 28 — AB (ref 13–22)
Chloride: 102 (ref 99–108)
Creatinine: 0.8 (ref 0.6–1.3)
Glucose: 97
Potassium: 4 (ref 3.4–5.3)
Sodium: 137 (ref 137–147)

## 2021-02-05 LAB — LIPID PANEL
Cholesterol: 109 (ref 0–200)
HDL: 45 (ref 35–70)
LDL Cholesterol: 50
LDl/HDL Ratio: 2.4
Triglycerides: 63 (ref 40–160)

## 2021-02-05 LAB — COMPREHENSIVE METABOLIC PANEL: Calcium: 8.7 (ref 8.7–10.7)

## 2021-02-06 ENCOUNTER — Encounter: Payer: Self-pay | Admitting: Nurse Practitioner

## 2021-02-19 ENCOUNTER — Non-Acute Institutional Stay (SKILLED_NURSING_FACILITY): Payer: Medicare HMO | Admitting: Internal Medicine

## 2021-02-19 ENCOUNTER — Encounter: Payer: Self-pay | Admitting: Internal Medicine

## 2021-02-19 DIAGNOSIS — R600 Localized edema: Secondary | ICD-10-CM

## 2021-02-19 DIAGNOSIS — I83019 Varicose veins of right lower extremity with ulcer of unspecified site: Secondary | ICD-10-CM | POA: Diagnosis not present

## 2021-02-19 DIAGNOSIS — L97919 Non-pressure chronic ulcer of unspecified part of right lower leg with unspecified severity: Secondary | ICD-10-CM

## 2021-02-19 NOTE — Progress Notes (Signed)
Location:  [facility] Room Number: 45 Place of Service:  SNF [phone]) Provider:  [name] MD   [name], MD  Patient Care Team: [name], MD as PCP - General (Internal Medicine) Gatha Mayer, MD as Consulting Physician (Gastroenterology) [name], [name], MD as Consulting Physician (Cardiology) [name], MD as Attending Physician (Internal Medicine) Mast, Man X, NP as Nurse Practitioner (Internal Medicine)  Extended Emergency Contact Information Primary Emergency Contact: Blunck,[name] Address: [place], [place] [address] [name] of [place] Phone: [phone] Mobile Phone: [phone] Relation: Daughter Secondary Emergency Contact: Bolio,[name] Address: [place] [address] [name] of [place] Phone: [phone] Mobile Phone: [phone] Relation: Son  Code Status:  DNR Managed Care Goals of care: Advanced Directive information Advanced Directives 02/19/2021  Does Patient Have a Medical Advance Directive? Yes  Type of Paramedic of [place];Living will  Does patient want to make changes to medical advance directive? No - Patient declined  Copy of [place] in Chart? Yes - validated most recent copy scanned in chart (See row information)  Pre-existing out of facility DNR order (yellow form or pink MOST form) -     Chief Complaint  Patient presents with   Acute Visit    Venous ulcer    HPI:  Pt is a 87 y.o. male seen today for an acute visit for Venous Ulcer  Patient has a history of BPH, bradycardia,  CAD, hypertension, diastolic CHF,  GERD,H/o Left Sphenoid Meningioma And Chronic  Seizures   Recently his Aspirin dose increased due to ? TIA per patients request   He also has requested to be off Neurontin   He had taken himself off his Lasix and then Developed Redness and Swelling of his  Leg Was treated with Antibiotics and Lasix restarted Nurse wanted me to evaluate his Venous Ulcer on back of the Right Shin  ? Discharge with [place]  Patient continues to keep his leg down in hies wheelchair Repeats himself all the time   Past Medical History:  Diagnosis Date   Allergic rhinitis    Amnesia    Atherosclerotic heart disease of native coronary artery without angina pectoris    BPH (benign prostatic hyperplasia)    Bradycardia    Chronic diastolic (congestive) heart failure (HCC)    Colon polyps    Coronary artery disease    MODERATE   Dizziness    Edema of lower extremity    Erectile dysfunction    H. pylori infection    Hx of    Hard of hearing    Headache(784.0)    History of colon polyps 2009   Dr. Orr/Colonoscopy -small cecal adenoma   History of falling    Hx of colonoscopy 2009   Dr Lajoyce Corners, hemorrhoids & polyps   Hyperlipidemia    Hypertension    Hypertensive heart disease with heart failure ([place])    Hypokalemia    Internal hemorrhoids 2009   Dr. Lajoyce Corners Colonoscopy   Metabolic encephalopathy    Mitral valve regurgitation    Muscle weakness (generalized)    Pneumonia    Primary generalized (osteo)arthritis    Reflux esophagitis    Sepsis ([place]) 11/04/2018   Unspecified hemorrhoids    Unsteadiness on feet    Vitamin D deficiency    Past  Surgical History:  Procedure Laterality Date   APPENDECTOMY     CARDIAC CATHETERIZATION  1997   REVEALED MILD TO MODERATE IRREGULARITIES. HIS LEFT EVNTRICULAR SYSTOLIC FUNCTION REVEALED NORMAL EJECTION FRACTION WITH EF OF 70%   CATARACT EXTRACTION, BILATERAL     COLONOSCOPY  multiple   fatty tumor removal     benign   HEMORROIDECTOMY     HIP ARTHROPLASTY Left 08/17/2013   Procedure: ARTHROPLASTY BIPOLAR HIP;  Surgeon: Gearlean Alf, MD;  Location: [facility];  Service: Orthopedics;  Laterality: Left;   INGUINAL HERNIA REPAIR Bilateral 2006   lower back surgery  2006   NASAL SEPTUM SURGERY     TONSILLECTOMY      UPPER GASTROINTESTINAL ENDOSCOPY      No Known Allergies  Allergies as of 02/19/2021   No Known Allergies      Medication List        Accurate as of February 19, 2021  3:20 PM. If you have any questions, ask your nurse or doctor.          STOP taking these medications    nitroGLYCERIN 0.4 MG SL tablet Commonly known as: NITROSTAT Stopped by: [name], MD   saccharomyces boulardii 250 MG capsule Commonly known as: FLORASTOR Stopped by: [name], MD       TAKE these medications    acetaminophen 500 MG tablet Commonly known as: TYLENOL Take 500 mg by mouth as needed.   amLODipine 5 MG tablet Commonly known as: NORVASC Take 5 mg by mouth daily.   aspirin 325 MG tablet Take 325 mg by mouth daily.   furosemide 40 MG tablet Commonly known as: LASIX Take 40 mg by mouth daily.   hydrocortisone 2.5 % rectal cream Commonly known as: ANUSOL-HC Place 1 application rectally 2 (two) times daily as needed for anal itching or hemorrhoids (USE PERINEAL APPLICATOR). With perineal applicator   levETIRAcetam 500 MG tablet Commonly known as: KEPPRA Take 1 tablet (500 mg total) by mouth 2 (two) times daily.   lisinopril 40 MG tablet Commonly known as: ZESTRIL Take 40 mg by mouth daily.   METHOCARBAMOL PO Take 250 mg by mouth as needed. Take 250 mg by mouth in the morning and an additional 250 mg once a day as needed for muscle spasms   mupirocin ointment 2 % Commonly known as: BACTROBAN Apply 1 application topically 2 (two) times daily.   omeprazole 20 MG capsule Commonly known as: PRILOSEC Take 20 mg by mouth daily.   polyvinyl alcohol 1.4 % ophthalmic solution Commonly known as: LIQUIFILM TEARS Place 1 drop into both eyes as needed for dry eyes.   potassium chloride SA 20 MEQ tablet Commonly known as: KLOR-CON Take 20 mEq by mouth daily.   rosuvastatin 5 MG tablet Commonly known as: CRESTOR Take 5 mg by mouth daily.   Thera-M Tabs Take 1  tablet by mouth daily.   vitamin B-12 1000 MCG tablet Commonly known as: CYANOCOBALAMIN Take 1,000 mcg by mouth daily.        Review of Systems  Constitutional:  Positive for activity change.  HENT: Negative.    Respiratory: Negative.    Cardiovascular:  Positive for leg swelling.  Gastrointestinal: Negative.   Genitourinary:  Positive for frequency.  Musculoskeletal:  Positive for gait problem.  Skin:  Positive for wound.  Neurological:  Positive for weakness.  Psychiatric/Behavioral:  Positive for confusion.    Immunization History  Administered Date(s) Administered   Influenza, High Dose Seasonal  PF 03/19/2019, 03/19/2019   Influenza-Unspecified 04/08/2016, 03/27/2020   Moderna Sars-Covid-2 Vaccination 06/18/2019, 07/16/2019, 04/24/2020, 11/13/2020   Pneumococcal Conjugate-13 06/02/2013   Pneumococcal Polysaccharide-23 04/07/2001   Pertinent  Health Maintenance Due  Topic Date Due   INFLUENZA VACCINE  01/14/2021   PNA vac Low Risk Adult  Completed   Fall Risk  11/03/2017 07/11/2016  Falls in the past year? Yes Yes  Number falls in past yr: 2 or more 2 or more  Injury with Fall? Yes Yes  Comment - broke toe on right foot  Risk Factor Category  High Fall Risk -  Follow up Education provided;Falls prevention discussed -   Functional Status Survey:    Vitals:   02/19/21 1510  BP: (!) 158/63  Pulse: 67  Resp: 18  Temp: (!) 97.5 F (36.4 C)  SpO2: 97%  Weight: 171 lb 14.4 oz (78 kg)  Height: '5\' 7"'$  (1.702 m)   Body mass index is 26.92 kg/m. Physical Exam Constitutional: Oriented to person, place, and time. Well-developed and well-nourished.  HENT:  Head: Normocephalic.  Mouth/Throat: Oropharynx is clear and moist.  Eyes: Pupils are equal, round, and reactive to light.  Neck: Neck supple.  Cardiovascular: Normal rate and normal heart sounds.  No murmur heard. Pulmonary/Chest: Effort normal and breath sounds normal. No respiratory distress. No wheezes. She  has no rales.  Abdominal: Soft. Bowel sounds are normal. No distension. There is no tenderness. There is no rebound.  Musculoskeletal:Edema Bilateral With Left leg is dry and Wounds healed Right Leg has Venous Ulcer Mid Shin with [facility] Discharge and redness  Lymphadenopathy: none Neurological: Alert and oriented to person, place, and time.  Skin: Skin is warm and dry.  Psychiatric: Normal mood and affect. Behavior is normal. Thought content normal.   Labs reviewed: Recent Labs    10/09/20 1607 11/14/20 0000 02/05/21 0000  NA 139 137 137  K 4.6 4.3 4.0  CL 104 102 102  CO2 28* 28* 28*  BUN '15 17 14  '$ CREATININE 1.1 0.9 0.8  CALCIUM 8.8 9.2 8.7   Recent Labs    04/06/20 0000 10/09/20 1607 11/14/20 0000  AST '16 16 18  '$ ALT '16 14 20  '$ ALKPHOS 66 54 63  ALBUMIN 3.4* 3.4* 3.9   Recent Labs    10/09/20 1607 11/14/20 0000  WBC 8.9 11.5  NEUTROABS 5,963.00 7,889.00  HGB 13.5 14.2  HCT 39* 43  PLT 236 231   Lab Results  Component Value Date   TSH 1.77 04/06/2020   No results found for: HGBA1C Lab Results  Component Value Date   CHOL 109 02/05/2021   HDL 45 02/05/2021   LDLCALC 50 02/05/2021   TRIG 63 02/05/2021   CHOLHDL 2.7 02/17/2017    Significant Diagnostic Results in last 30 days:  No results found.  Assessment/Plan Venous ulcer of right leg ([place]) Will start on Santyl for [employer] with Foam Dressing No Signs of active infection  Bilateral edema of lower extremity Elevate Legs Continue Lasix Creat stable  Primary hypertension On Lisinopril and Norvasc Mildily Elevated today   CAD On Aspirin , Statin    PAF (paroxysmal atrial fibrillation) (HCC) On Aspirin only Dose was increased per Patient due to ? TIA Not on Eliquis due to age and Frailty and Falls On Prilosec Seizure ([place]) On Keppra Mixed hyperlipidemia On Crestor LDL 50 on 08/22 Family/ staff Communication:   Labs/tests ordered:

## 2021-02-22 ENCOUNTER — Encounter: Payer: Self-pay | Admitting: Nurse Practitioner

## 2021-02-22 ENCOUNTER — Non-Acute Institutional Stay (SKILLED_NURSING_FACILITY): Payer: Medicare HMO | Admitting: Nurse Practitioner

## 2021-02-22 DIAGNOSIS — R296 Repeated falls: Secondary | ICD-10-CM

## 2021-02-22 DIAGNOSIS — K219 Gastro-esophageal reflux disease without esophagitis: Secondary | ICD-10-CM

## 2021-02-22 DIAGNOSIS — R569 Unspecified convulsions: Secondary | ICD-10-CM

## 2021-02-22 DIAGNOSIS — I10 Essential (primary) hypertension: Secondary | ICD-10-CM | POA: Diagnosis not present

## 2021-02-22 DIAGNOSIS — M8949 Other hypertrophic osteoarthropathy, multiple sites: Secondary | ICD-10-CM | POA: Diagnosis not present

## 2021-02-22 DIAGNOSIS — F5101 Primary insomnia: Secondary | ICD-10-CM

## 2021-02-22 DIAGNOSIS — I5032 Chronic diastolic (congestive) heart failure: Secondary | ICD-10-CM

## 2021-02-22 DIAGNOSIS — N401 Enlarged prostate with lower urinary tract symptoms: Secondary | ICD-10-CM | POA: Diagnosis not present

## 2021-02-22 DIAGNOSIS — N138 Other obstructive and reflux uropathy: Secondary | ICD-10-CM

## 2021-02-22 DIAGNOSIS — I872 Venous insufficiency (chronic) (peripheral): Secondary | ICD-10-CM

## 2021-02-22 DIAGNOSIS — I4891 Unspecified atrial fibrillation: Secondary | ICD-10-CM | POA: Diagnosis not present

## 2021-02-22 DIAGNOSIS — M159 Polyosteoarthritis, unspecified: Secondary | ICD-10-CM

## 2021-02-22 DIAGNOSIS — I2510 Atherosclerotic heart disease of native coronary artery without angina pectoris: Secondary | ICD-10-CM | POA: Diagnosis not present

## 2021-02-22 DIAGNOSIS — Z8673 Personal history of transient ischemic attack (TIA), and cerebral infarction without residual deficits: Secondary | ICD-10-CM

## 2021-02-22 DIAGNOSIS — R69 Illness, unspecified: Secondary | ICD-10-CM | POA: Diagnosis not present

## 2021-02-22 NOTE — Progress Notes (Signed)
Location:   [facility] Room Number: 61 Place of Service:  SNF (31) Provider:  Genevra Orne, Lennie Odor NP  [name], MD  Patient Care Team: [name], MD as PCP - General (Internal Medicine) Gatha Mayer, MD as Consulting Physician (Gastroenterology) [name], [name], MD as Consulting Physician (Cardiology) [name], MD as Attending Physician (Internal Medicine) Delwin Raczkowski X, NP as Nurse Practitioner (Internal Medicine)  Extended Emergency Contact Information Primary Emergency Contact: Deignan,[name] Address: [place], [place] [address] [name] of [place] Phone: [phone] Mobile Phone: [phone] Relation: Daughter Secondary Emergency Contact: Seelman,[name] Address: [place] [address] [name] of [place] Phone: [phone] Mobile Phone: [phone] Relation: Son  Code Status:  DNR Managed Care Goals of care: Advanced Directive information Advanced Directives 02/22/2021  Does Patient Have a Medical Advance Directive? Yes  Type of Paramedic of [place];Living will  Does patient want to make changes to medical advance directive? No - Patient declined  Copy of [place] in Chart? Yes - validated most recent copy scanned in chart (See row information)  Pre-existing out of facility DNR order (yellow form or pink MOST form) -     Chief Complaint  Patient presents with   Medical Management of Chronic Issues   Quality Metric Gaps    Shingrix, Flu shot    HPI:  Pt is a 87 y.o. male seen today for medical management of chronic diseases.      TIA, resolved right hand numbness, weakness, difficulty feeding self, changed to ASA '325mg'$  qd from '81mg'$  11/13/20             Recurrent falls, due to unsteady on his legs, attempting standing up or transfer with no assistance.              BPH, saw urology              PVD, BLE  brownish erythematous pigmentation, swelling, RLE venous ulcer, treated with Santyl.              Afib, onset 09/2019, heart rate is conrolled, not a candidate for anticoagulation,             CHF, EF Q000111Q, grade 1 diastolic dysfunction, on Furosemide, saw Cardiology             HTN, blood pressure is controled on amlodipine '5mg'$  qd, Lisinopril '40mg'$  qd. Bun/creat 14/0.8 02/05/21             Hx of CAD, hyperlipidemia, on Crestor, ASA. LDL 50 02/05/21.  10/09/20 EKG SR vent rate 65, no significant ST-T changes.              GERD, takes Omeprazole '20mg'$  qd. Hgb 14.2 11/14/20             Insomnia, stable, off Trazodone.              Chronic lower back pain/right hip pain,  on Gabapentin '200mg'$  qd             OA, s/p left hip surgery, chronic right hip pain, w/c helps, prn Tylenol             Seizure: takes Keppra '500mg'$  bid. F/u neurology   Past Medical History:  Diagnosis Date   Allergic rhinitis    Amnesia  Atherosclerotic heart disease of native coronary artery without angina pectoris    BPH (benign prostatic hyperplasia)    Bradycardia    Chronic diastolic (congestive) heart failure (HCC)    Colon polyps    Coronary artery disease    MODERATE   Dizziness    Edema of lower extremity    Erectile dysfunction    H. pylori infection    Hx of    Hard of hearing    Headache(784.0)    History of colon polyps 2009   Dr. Orr/Colonoscopy -small cecal adenoma   History of falling    Hx of colonoscopy 2009   Dr Lajoyce Corners, hemorrhoids & polyps   Hyperlipidemia    Hypertension    Hypertensive heart disease with heart failure ([place])    Hypokalemia    Internal hemorrhoids 2009   Dr. Lajoyce Corners Colonoscopy   Metabolic encephalopathy    Mitral valve regurgitation    Muscle weakness (generalized)    Pneumonia    Primary generalized (osteo)arthritis    Reflux esophagitis    Sepsis ([place]) 11/04/2018   Unspecified hemorrhoids    Unsteadiness on feet    Vitamin D deficiency    Past Surgical History:   Procedure Laterality Date   APPENDECTOMY     CARDIAC CATHETERIZATION  1997   REVEALED MILD TO MODERATE IRREGULARITIES. HIS LEFT EVNTRICULAR SYSTOLIC FUNCTION REVEALED NORMAL EJECTION FRACTION WITH EF OF 70%   CATARACT EXTRACTION, BILATERAL     COLONOSCOPY  multiple   fatty tumor removal     benign   HEMORROIDECTOMY     HIP ARTHROPLASTY Left 08/17/2013   Procedure: ARTHROPLASTY BIPOLAR HIP;  Surgeon: Gearlean Alf, MD;  Location: [facility];  Service: Orthopedics;  Laterality: Left;   INGUINAL HERNIA REPAIR Bilateral 2006   lower back surgery  2006   NASAL SEPTUM SURGERY     TONSILLECTOMY     UPPER GASTROINTESTINAL ENDOSCOPY      No Known Allergies  Allergies as of 02/22/2021   No Known Allergies      Medication List        Accurate as of February 22, 2021 11:59 PM. If you have any questions, ask your nurse or doctor.          acetaminophen 500 MG tablet Commonly known as: TYLENOL Take 500 mg by mouth as needed.   amLODipine 5 MG tablet Commonly known as: NORVASC Take 5 mg by mouth daily.   aspirin 325 MG tablet Take 325 mg by mouth daily.   furosemide 40 MG tablet Commonly known as: LASIX Take 40 mg by mouth daily.   hydrocortisone 2.5 % rectal cream Commonly known as: ANUSOL-HC Place 1 application rectally 2 (two) times daily as needed for anal itching or hemorrhoids (USE PERINEAL APPLICATOR). With perineal applicator   levETIRAcetam 500 MG tablet Commonly known as: KEPPRA Take 1 tablet (500 mg total) by mouth 2 (two) times daily.   lisinopril 40 MG tablet Commonly known as: ZESTRIL Take 40 mg by mouth daily.   METHOCARBAMOL PO Take 250 mg by mouth as needed. Take 250 mg by mouth in the morning and an additional 250 mg once a day as needed for muscle spasms   mupirocin ointment 2 % Commonly known as: BACTROBAN Apply 1 application topically 2 (two) times daily.   omeprazole 20 MG capsule Commonly known as: PRILOSEC Take 20 mg by mouth daily.    polyvinyl alcohol 1.4 % ophthalmic solution Commonly known as: LIQUIFILM TEARS Place 1 drop into  both eyes as needed for dry eyes.   potassium chloride SA 20 MEQ tablet Commonly known as: KLOR-CON Take 20 mEq by mouth daily.   rosuvastatin 5 MG tablet Commonly known as: CRESTOR Take 5 mg by mouth daily.   Thera-M Tabs Take 1 tablet by mouth daily.   vitamin B-12 1000 MCG tablet Commonly known as: CYANOCOBALAMIN Take 1,000 mcg by mouth daily.        Review of Systems  Constitutional:  Negative for fatigue, fever and unexpected weight change.  HENT:  Positive for hearing loss. Negative for congestion and voice change.   Eyes:  Negative for visual disturbance.  Respiratory:  Negative for cough, shortness of breath and wheezing.   Cardiovascular:  Positive for leg swelling.  Gastrointestinal:  Negative for abdominal pain and constipation.  Genitourinary:  Positive for frequency. Negative for difficulty urinating and urgency.  Musculoskeletal:  Positive for arthralgias, back pain and gait problem.       Lower back, right hip/thigh pain   Skin:  Positive for wound.       Chronic venous insufficiency skin changes BLE. Venous ulcer RLE is covered in dressing during my examination today.   Neurological:  Negative for seizures, weakness and headaches.       Memory lapses. Tingling in toes. Right hand weakness/numbness is improved, placed ASA 325 11/13/20  Psychiatric/Behavioral:  Negative for behavioral problems and sleep disturbance. The patient is not nervous/anxious.    Immunization History  Administered Date(s) Administered   Influenza, High Dose Seasonal PF 03/19/2019, 03/19/2019   Influenza-Unspecified 04/08/2016, 03/27/2020   Moderna Sars-Covid-2 Vaccination 06/18/2019, 07/16/2019, 04/24/2020, 11/13/2020   Pneumococcal Conjugate-13 06/02/2013   Pneumococcal Polysaccharide-23 04/07/2001   Pertinent  Health Maintenance Due  Topic Date Due   INFLUENZA VACCINE  01/14/2021    PNA vac Low Risk Adult  Completed   Fall Risk  11/03/2017 07/11/2016  Falls in the past year? Yes Yes  Number falls in past yr: 2 or more 2 or more  Injury with Fall? Yes Yes  Comment - broke toe on right foot  Risk Factor Category  High Fall Risk -  Follow up Education provided;Falls prevention discussed -   Functional Status Survey:    Vitals:   02/22/21 1140  BP: (!) 160/76  Pulse: 68  Resp: 18  Temp: (!) 97.5 F (36.4 C)  SpO2: 97%  Weight: 171 lb 14.4 oz (78 kg)  Height: '5\' 7"'$  (1.702 m)   Body mass index is 26.92 kg/m. Physical Exam Vitals and nursing note reviewed.  Constitutional:      Appearance: Normal appearance.  HENT:     Head: Normocephalic and atraumatic.     Mouth/Throat:     Mouth: Mucous membranes are moist.  Eyes:     Conjunctiva/sclera: Conjunctivae normal.     Pupils: Pupils are equal, round, and reactive to light.  Cardiovascular:     Rate and Rhythm: Normal rate and regular rhythm.     Heart sounds: Murmur heard.  Pulmonary:     Breath sounds: No rales.  Abdominal:     General: Bowel sounds are normal.     Palpations: Abdomen is soft.     Tenderness: There is no abdominal tenderness.  Genitourinary:    Comments: External hemorrhoids, no bleeding or injury.  Musculoskeletal:     Cervical back: Normal range of motion and neck supple. No muscular tenderness.     Right lower leg: Edema present.     Left lower leg: Edema  present.     Comments: 1+ edema BLE. W/c for mobility.   Skin:    General: Skin is warm and dry.     Findings: Erythema present.     Comments: Chronic venous insufficiency skin changes BLE, erythematous. Venous ulcer RLE is covered in dressing, the patient denied pain.   Neurological:     General: No focal deficit present.     Mental Status: He is alert. Mental status is at baseline.     Motor: No weakness.     Coordination: Coordination normal.     Gait: Gait abnormal.     Comments: Oriented to person, place.    Psychiatric:        Mood and Affect: Mood normal.        Behavior: Behavior normal.        Thought Content: Thought content normal.    Labs reviewed: Recent Labs    10/09/20 1607 11/14/20 0000 02/05/21 0000  NA 139 137 137  K 4.6 4.3 4.0  CL 104 102 102  CO2 28* 28* 28*  BUN '15 17 14  '$ CREATININE 1.1 0.9 0.8  CALCIUM 8.8 9.2 8.7   Recent Labs    04/06/20 0000 10/09/20 1607 11/14/20 0000  AST '16 16 18  '$ ALT '16 14 20  '$ ALKPHOS 66 54 63  ALBUMIN 3.4* 3.4* 3.9   Recent Labs    10/09/20 1607 11/14/20 0000  WBC 8.9 11.5  NEUTROABS 5,963.00 7,889.00  HGB 13.5 14.2  HCT 39* 43  PLT 236 231   Lab Results  Component Value Date   TSH 1.77 04/06/2020   No results found for: HGBA1C Lab Results  Component Value Date   CHOL 109 02/05/2021   HDL 45 02/05/2021   LDLCALC 50 02/05/2021   TRIG 63 02/05/2021   CHOLHDL 2.7 02/17/2017    Significant Diagnostic Results in last 30 days:  No results found.  Assessment/Plan New onset atrial fibrillation ([place]) onset 09/2019, heart rate is conrolled, not a candidate for anticoagulation,  Chronic diastolic heart failure (HCC) EF Q000111Q, grade 1 diastolic dysfunction, on Furosemide, saw Cardiology  Hypertension blood pressure is controled on amlodipine '5mg'$  qd, Lisinopril '40mg'$  qd. Bun/creat 14/0.8 02/05/21  CAD (coronary artery disease) Hx of CAD, hyperlipidemia, on Crestor, ASA. LDL 50 02/05/21.  10/09/20 EKG SR vent rate 65, no significant ST-T changes.   GERD (gastroesophageal reflux disease) takes Omeprazole '20mg'$  qd. Hgb 14.2 11/14/20  Insomnia stable, off Trazodone.   Osteoarthritis, multiple sites  Chronic lower back pain/right hip pain,  on Gabapentin '200mg'$  qd. OA, s/p left hip surgery, chronic right hip pain, w/c helps, prn Tylenol  Seizure ([place])  takes Keppra '500mg'$  bid. F/u neurology    Venous insufficiency (chronic) (peripheral) BLE brownish erythematous pigmentation, swelling, RLE venous ulcer, treated with  Santyl.   BPH (benign prostatic hyperplasia) saw urology  Recurrent falls  due to unsteady on his legs, attempting standing up or transfer with no assistance.   History of TIA (transient ischemic attack) No further focal weakness occurrence, had history of  right hand numbness, weakness, difficulty feeding self, changed to ASA '325mg'$  qd from '81mg'$  11/13/20    Family/ staff Communication: Plan of care reviewed with the patient and charge nurse.   Labs/tests ordered:  none  Time spend 35 minutes.

## 2021-02-25 NOTE — Assessment & Plan Note (Signed)
Hx of CAD, hyperlipidemia, on Crestor, ASA. LDL 50 02/05/21.  10/09/20 EKG SR vent rate 65, no significant ST-T changes.

## 2021-02-25 NOTE — Assessment & Plan Note (Signed)
blood pressure is controled on amlodipine '5mg'$  qd, Lisinopril '40mg'$  qd. Bun/creat 14/0.8 02/05/21

## 2021-02-25 NOTE — Assessment & Plan Note (Signed)
takes Omeprazole '20mg'$  qd. Hgb 14.2 11/14/20

## 2021-02-25 NOTE — Assessment & Plan Note (Signed)
onset 09/2019, heart rate is conrolled, not a candidate for anticoagulation,

## 2021-02-25 NOTE — Assessment & Plan Note (Signed)
Chronic lower back pain/right hip pain,  on Gabapentin '200mg'$  qd. OA, s/p left hip surgery, chronic right hip pain, w/c helps, prn Tylenol

## 2021-02-25 NOTE — Assessment & Plan Note (Signed)
No further focal weakness occurrence, had history of  right hand numbness, weakness, difficulty feeding self, changed to ASA '325mg'$  qd from '81mg'$  11/13/20

## 2021-02-25 NOTE — Assessment & Plan Note (Signed)
saw urology

## 2021-02-25 NOTE — Assessment & Plan Note (Signed)
stable, off Trazodone.  

## 2021-02-25 NOTE — Assessment & Plan Note (Signed)
EF 34-03%, grade 1 diastolic dysfunction, on Furosemide, saw Cardiology

## 2021-02-25 NOTE — Assessment & Plan Note (Signed)
BLE brownish erythematous pigmentation, swelling, RLE venous ulcer, treated with Santyl.

## 2021-02-25 NOTE — Assessment & Plan Note (Signed)
due to unsteady on his legs, attempting standing up or transfer with no assistance.  

## 2021-02-25 NOTE — Assessment & Plan Note (Signed)
takes Keppra '500mg'$  bid. F/u neurology

## 2021-02-26 ENCOUNTER — Encounter: Payer: Self-pay | Admitting: Nurse Practitioner

## 2021-02-26 DIAGNOSIS — L821 Other seborrheic keratosis: Secondary | ICD-10-CM | POA: Diagnosis not present

## 2021-02-26 DIAGNOSIS — L814 Other melanin hyperpigmentation: Secondary | ICD-10-CM | POA: Diagnosis not present

## 2021-02-26 DIAGNOSIS — I872 Venous insufficiency (chronic) (peripheral): Secondary | ICD-10-CM | POA: Diagnosis not present

## 2021-02-26 DIAGNOSIS — D1801 Hemangioma of skin and subcutaneous tissue: Secondary | ICD-10-CM | POA: Diagnosis not present

## 2021-02-26 DIAGNOSIS — L89210 Pressure ulcer of right hip, unstageable: Secondary | ICD-10-CM | POA: Diagnosis not present

## 2021-02-26 DIAGNOSIS — D229 Melanocytic nevi, unspecified: Secondary | ICD-10-CM | POA: Diagnosis not present

## 2021-03-05 DIAGNOSIS — M25662 Stiffness of left knee, not elsewhere classified: Secondary | ICD-10-CM | POA: Diagnosis not present

## 2021-03-05 DIAGNOSIS — M25652 Stiffness of left hip, not elsewhere classified: Secondary | ICD-10-CM | POA: Diagnosis not present

## 2021-03-05 DIAGNOSIS — R1310 Dysphagia, unspecified: Secondary | ICD-10-CM | POA: Diagnosis not present

## 2021-03-05 DIAGNOSIS — M25661 Stiffness of right knee, not elsewhere classified: Secondary | ICD-10-CM | POA: Diagnosis not present

## 2021-03-05 DIAGNOSIS — R1312 Dysphagia, oropharyngeal phase: Secondary | ICD-10-CM | POA: Diagnosis not present

## 2021-03-05 DIAGNOSIS — M6281 Muscle weakness (generalized): Secondary | ICD-10-CM | POA: Diagnosis not present

## 2021-03-05 DIAGNOSIS — I5032 Chronic diastolic (congestive) heart failure: Secondary | ICD-10-CM | POA: Diagnosis not present

## 2021-03-05 DIAGNOSIS — K219 Gastro-esophageal reflux disease without esophagitis: Secondary | ICD-10-CM | POA: Diagnosis not present

## 2021-03-05 DIAGNOSIS — R41841 Cognitive communication deficit: Secondary | ICD-10-CM | POA: Diagnosis not present

## 2021-03-05 DIAGNOSIS — M25651 Stiffness of right hip, not elsewhere classified: Secondary | ICD-10-CM | POA: Diagnosis not present

## 2021-03-08 DIAGNOSIS — M25662 Stiffness of left knee, not elsewhere classified: Secondary | ICD-10-CM | POA: Diagnosis not present

## 2021-03-08 DIAGNOSIS — M25651 Stiffness of right hip, not elsewhere classified: Secondary | ICD-10-CM | POA: Diagnosis not present

## 2021-03-08 DIAGNOSIS — R41841 Cognitive communication deficit: Secondary | ICD-10-CM | POA: Diagnosis not present

## 2021-03-08 DIAGNOSIS — R1312 Dysphagia, oropharyngeal phase: Secondary | ICD-10-CM | POA: Diagnosis not present

## 2021-03-08 DIAGNOSIS — R1310 Dysphagia, unspecified: Secondary | ICD-10-CM | POA: Diagnosis not present

## 2021-03-08 DIAGNOSIS — I5032 Chronic diastolic (congestive) heart failure: Secondary | ICD-10-CM | POA: Diagnosis not present

## 2021-03-08 DIAGNOSIS — K219 Gastro-esophageal reflux disease without esophagitis: Secondary | ICD-10-CM | POA: Diagnosis not present

## 2021-03-08 DIAGNOSIS — M25661 Stiffness of right knee, not elsewhere classified: Secondary | ICD-10-CM | POA: Diagnosis not present

## 2021-03-08 DIAGNOSIS — M25652 Stiffness of left hip, not elsewhere classified: Secondary | ICD-10-CM | POA: Diagnosis not present

## 2021-03-08 DIAGNOSIS — M6281 Muscle weakness (generalized): Secondary | ICD-10-CM | POA: Diagnosis not present

## 2021-03-13 DIAGNOSIS — M6281 Muscle weakness (generalized): Secondary | ICD-10-CM | POA: Diagnosis not present

## 2021-03-13 DIAGNOSIS — M25662 Stiffness of left knee, not elsewhere classified: Secondary | ICD-10-CM | POA: Diagnosis not present

## 2021-03-13 DIAGNOSIS — M25661 Stiffness of right knee, not elsewhere classified: Secondary | ICD-10-CM | POA: Diagnosis not present

## 2021-03-13 DIAGNOSIS — K219 Gastro-esophageal reflux disease without esophagitis: Secondary | ICD-10-CM | POA: Diagnosis not present

## 2021-03-13 DIAGNOSIS — R1310 Dysphagia, unspecified: Secondary | ICD-10-CM | POA: Diagnosis not present

## 2021-03-13 DIAGNOSIS — M25651 Stiffness of right hip, not elsewhere classified: Secondary | ICD-10-CM | POA: Diagnosis not present

## 2021-03-13 DIAGNOSIS — R41841 Cognitive communication deficit: Secondary | ICD-10-CM | POA: Diagnosis not present

## 2021-03-13 DIAGNOSIS — R1312 Dysphagia, oropharyngeal phase: Secondary | ICD-10-CM | POA: Diagnosis not present

## 2021-03-13 DIAGNOSIS — I5032 Chronic diastolic (congestive) heart failure: Secondary | ICD-10-CM | POA: Diagnosis not present

## 2021-03-13 DIAGNOSIS — M25652 Stiffness of left hip, not elsewhere classified: Secondary | ICD-10-CM | POA: Diagnosis not present

## 2021-03-14 DIAGNOSIS — M25662 Stiffness of left knee, not elsewhere classified: Secondary | ICD-10-CM | POA: Diagnosis not present

## 2021-03-14 DIAGNOSIS — R41841 Cognitive communication deficit: Secondary | ICD-10-CM | POA: Diagnosis not present

## 2021-03-14 DIAGNOSIS — K219 Gastro-esophageal reflux disease without esophagitis: Secondary | ICD-10-CM | POA: Diagnosis not present

## 2021-03-14 DIAGNOSIS — R1310 Dysphagia, unspecified: Secondary | ICD-10-CM | POA: Diagnosis not present

## 2021-03-14 DIAGNOSIS — M25661 Stiffness of right knee, not elsewhere classified: Secondary | ICD-10-CM | POA: Diagnosis not present

## 2021-03-14 DIAGNOSIS — R1312 Dysphagia, oropharyngeal phase: Secondary | ICD-10-CM | POA: Diagnosis not present

## 2021-03-14 DIAGNOSIS — M6281 Muscle weakness (generalized): Secondary | ICD-10-CM | POA: Diagnosis not present

## 2021-03-14 DIAGNOSIS — M25652 Stiffness of left hip, not elsewhere classified: Secondary | ICD-10-CM | POA: Diagnosis not present

## 2021-03-14 DIAGNOSIS — I5032 Chronic diastolic (congestive) heart failure: Secondary | ICD-10-CM | POA: Diagnosis not present

## 2021-03-14 DIAGNOSIS — M25651 Stiffness of right hip, not elsewhere classified: Secondary | ICD-10-CM | POA: Diagnosis not present

## 2021-03-18 DIAGNOSIS — R2681 Unsteadiness on feet: Secondary | ICD-10-CM | POA: Diagnosis not present

## 2021-03-18 DIAGNOSIS — R1310 Dysphagia, unspecified: Secondary | ICD-10-CM | POA: Diagnosis not present

## 2021-03-18 DIAGNOSIS — M25652 Stiffness of left hip, not elsewhere classified: Secondary | ICD-10-CM | POA: Diagnosis not present

## 2021-03-18 DIAGNOSIS — M25661 Stiffness of right knee, not elsewhere classified: Secondary | ICD-10-CM | POA: Diagnosis not present

## 2021-03-18 DIAGNOSIS — M25662 Stiffness of left knee, not elsewhere classified: Secondary | ICD-10-CM | POA: Diagnosis not present

## 2021-03-18 DIAGNOSIS — K219 Gastro-esophageal reflux disease without esophagitis: Secondary | ICD-10-CM | POA: Diagnosis not present

## 2021-03-18 DIAGNOSIS — R1312 Dysphagia, oropharyngeal phase: Secondary | ICD-10-CM | POA: Diagnosis not present

## 2021-03-18 DIAGNOSIS — M25651 Stiffness of right hip, not elsewhere classified: Secondary | ICD-10-CM | POA: Diagnosis not present

## 2021-03-18 DIAGNOSIS — R278 Other lack of coordination: Secondary | ICD-10-CM | POA: Diagnosis not present

## 2021-03-19 ENCOUNTER — Encounter: Payer: Self-pay | Admitting: Internal Medicine

## 2021-03-19 ENCOUNTER — Non-Acute Institutional Stay (SKILLED_NURSING_FACILITY): Payer: Medicare HMO | Admitting: Internal Medicine

## 2021-03-19 DIAGNOSIS — I5032 Chronic diastolic (congestive) heart failure: Secondary | ICD-10-CM | POA: Diagnosis not present

## 2021-03-19 DIAGNOSIS — I480 Paroxysmal atrial fibrillation: Secondary | ICD-10-CM

## 2021-03-19 DIAGNOSIS — S81801S Unspecified open wound, right lower leg, sequela: Secondary | ICD-10-CM

## 2021-03-19 DIAGNOSIS — I10 Essential (primary) hypertension: Secondary | ICD-10-CM

## 2021-03-19 DIAGNOSIS — I2510 Atherosclerotic heart disease of native coronary artery without angina pectoris: Secondary | ICD-10-CM

## 2021-03-19 DIAGNOSIS — Z8673 Personal history of transient ischemic attack (TIA), and cerebral infarction without residual deficits: Secondary | ICD-10-CM

## 2021-03-19 DIAGNOSIS — R569 Unspecified convulsions: Secondary | ICD-10-CM

## 2021-03-19 NOTE — Progress Notes (Signed)
Location:   [facility] Room Number: 87 Place of Service:  SNF [phone]) Provider:  [name] MD   [name], MD  Patient Care Team: [name], MD as PCP - General (Internal Medicine) Gatha Mayer, MD as Consulting Physician (Gastroenterology) [name], [name], MD as Consulting Physician (Cardiology) [name], MD as Attending Physician (Internal Medicine) Mast, Man X, NP as Nurse Practitioner (Internal Medicine)  Extended Emergency Contact Information Primary Emergency Contact: Stofko,[name] Address: [place], [place] [address] [name] of [place] Phone: [phone] Mobile Phone: [phone] Relation: Daughter Secondary Emergency Contact: Held,[name] Address: [place] [address] [name] of [place] Phone: [phone] Mobile Phone: [phone] Relation: Son  Code Status:  DNR Managed Care Goals of care: Advanced Directive information Advanced Directives 03/19/2021  Does Patient Have a Medical Advance Directive? Yes  Type of Paramedic of [place];Living will  Does patient want to make changes to medical advance directive? No - Patient declined  Copy of [place] in Chart? Yes - validated most recent copy scanned in chart (See row information)  Pre-existing out of facility DNR order (yellow form or pink MOST form) -     Chief Complaint  Patient presents with   Medical Management of Chronic Issues   Quality Metric Gaps    Shingrix, Flu shot    HPI:  Pt is a 87 y.o. male seen today for medical management of chronic diseases.   Seen for Readmit to the Certified bed in SNF  Patient has a history of BPH, bradycardia,  CAD, hypertension, diastolic CHF,  GERD, H/o Left Sphenoid Meningioma And Chronic Seizures  ? TIA Is now on Higher dose of Aspirin per his request  LE Wound He had taken himself  off his Lasix and then Developed Redness and Swelling of his Leg Was treated with Antibiotics and Lasix restarted Continues to have open Wound with Necrosis in the base and some odor No pain  Swelling is better on Lasix Weight is stable No Complains today Mostly Wheelchair bound. Cognitively doing well Appetite is good. No Falls recently   Past Medical History:  Diagnosis Date   Allergic rhinitis    Amnesia    Atherosclerotic heart disease of native coronary artery without angina pectoris    BPH (benign prostatic hyperplasia)    Bradycardia    Chronic diastolic (congestive) heart failure (HCC)    Colon polyps    Coronary artery disease    MODERATE   Dizziness    Edema of lower extremity    Erectile dysfunction    H. pylori infection    Hx of    Hard of hearing    Headache(784.0)    History of colon polyps 2009   Dr. Orr/Colonoscopy -small cecal adenoma   History of falling    Hx of colonoscopy 2009   Dr Lajoyce Corners, hemorrhoids & polyps   Hyperlipidemia    Hypertension    Hypertensive heart disease with heart failure ([place])    Hypokalemia    Internal hemorrhoids 2009   Dr. Lajoyce Corners Colonoscopy   Metabolic encephalopathy    Mitral valve regurgitation    Muscle weakness (generalized)    Pneumonia    Primary generalized (osteo)arthritis    Reflux esophagitis    Sepsis ([place]) 11/04/2018   Unspecified hemorrhoids  Unsteadiness on feet    Vitamin D deficiency    Past Surgical History:  Procedure Laterality Date   APPENDECTOMY     CARDIAC CATHETERIZATION  1997   REVEALED MILD TO MODERATE IRREGULARITIES. HIS LEFT EVNTRICULAR SYSTOLIC FUNCTION REVEALED NORMAL EJECTION FRACTION WITH EF OF 70%   CATARACT EXTRACTION, BILATERAL     COLONOSCOPY  multiple   fatty tumor removal     benign   HEMORROIDECTOMY     HIP ARTHROPLASTY Left 08/17/2013   Procedure: ARTHROPLASTY BIPOLAR HIP;  Surgeon: Gearlean Alf, MD;  Location: [facility];  Service: Orthopedics;  Laterality: Left;   INGUINAL  HERNIA REPAIR Bilateral 2006   lower back surgery  2006   NASAL SEPTUM SURGERY     TONSILLECTOMY     UPPER GASTROINTESTINAL ENDOSCOPY      No Known Allergies  Allergies as of 03/19/2021   No Known Allergies      Medication List        Accurate as of March 19, 2021 11:24 AM. If you have any questions, ask your nurse or doctor.          acetaminophen 500 MG tablet Commonly known as: TYLENOL Take 500 mg by mouth as needed.   amLODipine 5 MG tablet Commonly known as: NORVASC Take 5 mg by mouth daily.   aspirin 325 MG tablet Take 325 mg by mouth daily.   furosemide 40 MG tablet Commonly known as: LASIX Take 40 mg by mouth daily.   hydrocortisone 2.5 % rectal cream Commonly known as: ANUSOL-HC Place 1 application rectally 2 (two) times daily as needed for anal itching or hemorrhoids (USE PERINEAL APPLICATOR). With perineal applicator   levETIRAcetam 500 MG tablet Commonly known as: KEPPRA Take 1 tablet (500 mg total) by mouth 2 (two) times daily.   lisinopril 40 MG tablet Commonly known as: ZESTRIL Take 40 mg by mouth daily.   METHOCARBAMOL PO Take 250 mg by mouth as needed. Take 250 mg by mouth in the morning and an additional 250 mg once a day as needed for muscle spasms   mupirocin ointment 2 % Commonly known as: BACTROBAN Apply 1 application topically 2 (two) times daily.   omeprazole 20 MG capsule Commonly known as: PRILOSEC Take 20 mg by mouth daily.   polyvinyl alcohol 1.4 % ophthalmic solution Commonly known as: LIQUIFILM TEARS Place 1 drop into both eyes as needed for dry eyes.   potassium chloride SA 20 MEQ tablet Commonly known as: KLOR-CON Take 20 mEq by mouth daily.   rosuvastatin 5 MG tablet Commonly known as: CRESTOR Take 5 mg by mouth daily.   Thera-M Tabs Take 1 tablet by mouth daily.   vitamin B-12 1000 MCG tablet Commonly known as: CYANOCOBALAMIN Take 1,000 mcg by mouth daily.        Review of Systems  Constitutional:   Positive for activity change.  HENT: Negative.    Respiratory: Negative.    Cardiovascular:  Positive for leg swelling.  Gastrointestinal: Negative.   Genitourinary: Negative.   Musculoskeletal:  Positive for gait problem.  Skin:  Positive for wound.  Neurological:  Negative for dizziness.  Psychiatric/Behavioral:  Positive for confusion.    Immunization History  Administered Date(s) Administered   Influenza, High Dose Seasonal PF 03/19/2019, 03/19/2019   Influenza-Unspecified 04/08/2016, 03/27/2020   Moderna Sars-Covid-2 Vaccination 06/18/2019, 07/16/2019, 04/24/2020, 11/13/2020   Pneumococcal Conjugate-13 06/02/2013   Pneumococcal Polysaccharide-23 04/07/2001   Pertinent  Health Maintenance Due  Topic Date Due   INFLUENZA  VACCINE  01/14/2021   Fall Risk  11/03/2017 07/11/2016  Falls in the past year? Yes Yes  Number falls in past yr: 2 or more 2 or more  Injury with Fall? Yes Yes  Comment - broke toe on right foot  Risk Factor Category  High Fall Risk -  Follow up Education provided;Falls prevention discussed -   Functional Status Survey:    Vitals:   03/19/21 1109  BP: 138/70  Pulse: 70  Resp: 20  Temp: (!) 97.5 F (36.4 C)  SpO2: 95%  Weight: 173 lb 12.8 oz (78.8 kg)  Height: 5\' 7"  (1.702 m)   Body mass index is 27.22 kg/m. Physical Exam Vitals reviewed.  Constitutional:      Appearance: Normal appearance.  HENT:     Head: Normocephalic.     Nose: Nose normal.     Mouth/Throat:     Mouth: Mucous membranes are moist.     Pharynx: Oropharynx is clear.  Eyes:     Pupils: Pupils are equal, round, and reactive to light.  Cardiovascular:     Rate and Rhythm: Normal rate and regular rhythm.     Pulses: Normal pulses.     Heart sounds: Normal heart sounds.  Pulmonary:     Effort: Pulmonary effort is normal.     Breath sounds: Normal breath sounds.  Abdominal:     General: Abdomen is flat. Bowel sounds are normal.     Palpations: Abdomen is soft.   Musculoskeletal:     Cervical back: Neck supple.     Comments: Mild Swelling Bilateral  Skin:    Comments: Wound in his Right LE 4 cm with [facility] and Discharge  Neurological:     General: No focal deficit present.     Mental Status: He is alert and oriented to person, place, and time.  Psychiatric:        Mood and Affect: Mood normal.        Thought Content: Thought content normal.    Labs reviewed: Recent Labs    10/09/20 1607 11/14/20 0000 02/05/21 0000  NA 139 137 137  K 4.6 4.3 4.0  CL 104 102 102  CO2 28* 28* 28*  BUN 15 17 14   CREATININE 1.1 0.9 0.8  CALCIUM 8.8 9.2 8.7   Recent Labs    04/06/20 0000 10/09/20 1607 11/14/20 0000  AST 16 16 18   ALT 16 14 20   ALKPHOS 66 54 63  ALBUMIN 3.4* 3.4* 3.9   Recent Labs    10/09/20 1607 11/14/20 0000  WBC 8.9 11.5  NEUTROABS 5,963.00 7,889.00  HGB 13.5 14.2  HCT 39* 43  PLT 236 231   Lab Results  Component Value Date   TSH 1.77 04/06/2020   No results found for: HGBA1C Lab Results  Component Value Date   CHOL 109 02/05/2021   HDL 45 02/05/2021   LDLCALC 50 02/05/2021   TRIG 63 02/05/2021   CHOLHDL 2.7 02/17/2017    Significant Diagnostic Results in last 30 days:  No results found.  Assessment/Plan Venous Wound of right lower extremity, sequela On Santyl and order Silver Alginate with Telfa and Gauze Has Appointment with Wound care Chronic diastolic heart failure (HCC) On Lasix Swelling better Primary hypertension On Norvasc and Lisinopril Coronary artery disease  On Statin and ASpirin PAF (paroxysmal atrial fibrillation) (HCC) Not on any DOCA due to fraility and Age Seizure ([place]) Continue  on Kepprra History of TIA (transient ischemic attack) On high does of Aspirin  and statin Per patients Request He does not want to see Neurology GERD On Prilosec HLD  On statin LDL 50 in 8/22 Family/ staff Communication:   Labs/tests ordered:

## 2021-03-20 DIAGNOSIS — R1312 Dysphagia, oropharyngeal phase: Secondary | ICD-10-CM | POA: Diagnosis not present

## 2021-03-20 DIAGNOSIS — R1310 Dysphagia, unspecified: Secondary | ICD-10-CM | POA: Diagnosis not present

## 2021-03-20 DIAGNOSIS — M25651 Stiffness of right hip, not elsewhere classified: Secondary | ICD-10-CM | POA: Diagnosis not present

## 2021-03-20 DIAGNOSIS — M25661 Stiffness of right knee, not elsewhere classified: Secondary | ICD-10-CM | POA: Diagnosis not present

## 2021-03-20 DIAGNOSIS — R2681 Unsteadiness on feet: Secondary | ICD-10-CM | POA: Diagnosis not present

## 2021-03-20 DIAGNOSIS — M25662 Stiffness of left knee, not elsewhere classified: Secondary | ICD-10-CM | POA: Diagnosis not present

## 2021-03-20 DIAGNOSIS — M25652 Stiffness of left hip, not elsewhere classified: Secondary | ICD-10-CM | POA: Diagnosis not present

## 2021-03-20 DIAGNOSIS — R278 Other lack of coordination: Secondary | ICD-10-CM | POA: Diagnosis not present

## 2021-03-20 DIAGNOSIS — K219 Gastro-esophageal reflux disease without esophagitis: Secondary | ICD-10-CM | POA: Diagnosis not present

## 2021-03-21 DIAGNOSIS — M25651 Stiffness of right hip, not elsewhere classified: Secondary | ICD-10-CM | POA: Diagnosis not present

## 2021-03-21 DIAGNOSIS — R1310 Dysphagia, unspecified: Secondary | ICD-10-CM | POA: Diagnosis not present

## 2021-03-21 DIAGNOSIS — R278 Other lack of coordination: Secondary | ICD-10-CM | POA: Diagnosis not present

## 2021-03-21 DIAGNOSIS — M25661 Stiffness of right knee, not elsewhere classified: Secondary | ICD-10-CM | POA: Diagnosis not present

## 2021-03-21 DIAGNOSIS — K219 Gastro-esophageal reflux disease without esophagitis: Secondary | ICD-10-CM | POA: Diagnosis not present

## 2021-03-21 DIAGNOSIS — R2681 Unsteadiness on feet: Secondary | ICD-10-CM | POA: Diagnosis not present

## 2021-03-21 DIAGNOSIS — M25662 Stiffness of left knee, not elsewhere classified: Secondary | ICD-10-CM | POA: Diagnosis not present

## 2021-03-21 DIAGNOSIS — M25652 Stiffness of left hip, not elsewhere classified: Secondary | ICD-10-CM | POA: Diagnosis not present

## 2021-03-21 DIAGNOSIS — R1312 Dysphagia, oropharyngeal phase: Secondary | ICD-10-CM | POA: Diagnosis not present

## 2021-03-22 DIAGNOSIS — R1312 Dysphagia, oropharyngeal phase: Secondary | ICD-10-CM | POA: Diagnosis not present

## 2021-03-22 DIAGNOSIS — R2681 Unsteadiness on feet: Secondary | ICD-10-CM | POA: Diagnosis not present

## 2021-03-22 DIAGNOSIS — M25651 Stiffness of right hip, not elsewhere classified: Secondary | ICD-10-CM | POA: Diagnosis not present

## 2021-03-22 DIAGNOSIS — K219 Gastro-esophageal reflux disease without esophagitis: Secondary | ICD-10-CM | POA: Diagnosis not present

## 2021-03-22 DIAGNOSIS — M25661 Stiffness of right knee, not elsewhere classified: Secondary | ICD-10-CM | POA: Diagnosis not present

## 2021-03-22 DIAGNOSIS — M25662 Stiffness of left knee, not elsewhere classified: Secondary | ICD-10-CM | POA: Diagnosis not present

## 2021-03-22 DIAGNOSIS — M25652 Stiffness of left hip, not elsewhere classified: Secondary | ICD-10-CM | POA: Diagnosis not present

## 2021-03-22 DIAGNOSIS — R1310 Dysphagia, unspecified: Secondary | ICD-10-CM | POA: Diagnosis not present

## 2021-03-22 DIAGNOSIS — R278 Other lack of coordination: Secondary | ICD-10-CM | POA: Diagnosis not present

## 2021-03-26 DIAGNOSIS — K219 Gastro-esophageal reflux disease without esophagitis: Secondary | ICD-10-CM | POA: Diagnosis not present

## 2021-03-26 DIAGNOSIS — R1312 Dysphagia, oropharyngeal phase: Secondary | ICD-10-CM | POA: Diagnosis not present

## 2021-03-26 DIAGNOSIS — R1310 Dysphagia, unspecified: Secondary | ICD-10-CM | POA: Diagnosis not present

## 2021-03-26 DIAGNOSIS — M25661 Stiffness of right knee, not elsewhere classified: Secondary | ICD-10-CM | POA: Diagnosis not present

## 2021-03-26 DIAGNOSIS — M25651 Stiffness of right hip, not elsewhere classified: Secondary | ICD-10-CM | POA: Diagnosis not present

## 2021-03-26 DIAGNOSIS — R278 Other lack of coordination: Secondary | ICD-10-CM | POA: Diagnosis not present

## 2021-03-26 DIAGNOSIS — R2681 Unsteadiness on feet: Secondary | ICD-10-CM | POA: Diagnosis not present

## 2021-03-26 DIAGNOSIS — M25662 Stiffness of left knee, not elsewhere classified: Secondary | ICD-10-CM | POA: Diagnosis not present

## 2021-03-26 DIAGNOSIS — M25652 Stiffness of left hip, not elsewhere classified: Secondary | ICD-10-CM | POA: Diagnosis not present

## 2021-03-27 DIAGNOSIS — R1310 Dysphagia, unspecified: Secondary | ICD-10-CM | POA: Diagnosis not present

## 2021-03-27 DIAGNOSIS — K219 Gastro-esophageal reflux disease without esophagitis: Secondary | ICD-10-CM | POA: Diagnosis not present

## 2021-03-27 DIAGNOSIS — M25662 Stiffness of left knee, not elsewhere classified: Secondary | ICD-10-CM | POA: Diagnosis not present

## 2021-03-27 DIAGNOSIS — R278 Other lack of coordination: Secondary | ICD-10-CM | POA: Diagnosis not present

## 2021-03-27 DIAGNOSIS — R2681 Unsteadiness on feet: Secondary | ICD-10-CM | POA: Diagnosis not present

## 2021-03-27 DIAGNOSIS — M25661 Stiffness of right knee, not elsewhere classified: Secondary | ICD-10-CM | POA: Diagnosis not present

## 2021-03-27 DIAGNOSIS — M25651 Stiffness of right hip, not elsewhere classified: Secondary | ICD-10-CM | POA: Diagnosis not present

## 2021-03-27 DIAGNOSIS — M25652 Stiffness of left hip, not elsewhere classified: Secondary | ICD-10-CM | POA: Diagnosis not present

## 2021-03-27 DIAGNOSIS — R1312 Dysphagia, oropharyngeal phase: Secondary | ICD-10-CM | POA: Diagnosis not present

## 2021-03-28 DIAGNOSIS — M25651 Stiffness of right hip, not elsewhere classified: Secondary | ICD-10-CM | POA: Diagnosis not present

## 2021-03-28 DIAGNOSIS — M25661 Stiffness of right knee, not elsewhere classified: Secondary | ICD-10-CM | POA: Diagnosis not present

## 2021-03-28 DIAGNOSIS — M25652 Stiffness of left hip, not elsewhere classified: Secondary | ICD-10-CM | POA: Diagnosis not present

## 2021-03-28 DIAGNOSIS — R278 Other lack of coordination: Secondary | ICD-10-CM | POA: Diagnosis not present

## 2021-03-28 DIAGNOSIS — K219 Gastro-esophageal reflux disease without esophagitis: Secondary | ICD-10-CM | POA: Diagnosis not present

## 2021-03-28 DIAGNOSIS — R1312 Dysphagia, oropharyngeal phase: Secondary | ICD-10-CM | POA: Diagnosis not present

## 2021-03-28 DIAGNOSIS — R1310 Dysphagia, unspecified: Secondary | ICD-10-CM | POA: Diagnosis not present

## 2021-03-28 DIAGNOSIS — M25662 Stiffness of left knee, not elsewhere classified: Secondary | ICD-10-CM | POA: Diagnosis not present

## 2021-03-28 DIAGNOSIS — R2681 Unsteadiness on feet: Secondary | ICD-10-CM | POA: Diagnosis not present

## 2021-03-29 DIAGNOSIS — M25652 Stiffness of left hip, not elsewhere classified: Secondary | ICD-10-CM | POA: Diagnosis not present

## 2021-03-29 DIAGNOSIS — R2681 Unsteadiness on feet: Secondary | ICD-10-CM | POA: Diagnosis not present

## 2021-03-29 DIAGNOSIS — R1312 Dysphagia, oropharyngeal phase: Secondary | ICD-10-CM | POA: Diagnosis not present

## 2021-03-29 DIAGNOSIS — R1310 Dysphagia, unspecified: Secondary | ICD-10-CM | POA: Diagnosis not present

## 2021-03-29 DIAGNOSIS — M25661 Stiffness of right knee, not elsewhere classified: Secondary | ICD-10-CM | POA: Diagnosis not present

## 2021-03-29 DIAGNOSIS — M25651 Stiffness of right hip, not elsewhere classified: Secondary | ICD-10-CM | POA: Diagnosis not present

## 2021-03-29 DIAGNOSIS — K219 Gastro-esophageal reflux disease without esophagitis: Secondary | ICD-10-CM | POA: Diagnosis not present

## 2021-03-29 DIAGNOSIS — M25662 Stiffness of left knee, not elsewhere classified: Secondary | ICD-10-CM | POA: Diagnosis not present

## 2021-03-29 DIAGNOSIS — R278 Other lack of coordination: Secondary | ICD-10-CM | POA: Diagnosis not present

## 2021-04-02 ENCOUNTER — Encounter (HOSPITAL_BASED_OUTPATIENT_CLINIC_OR_DEPARTMENT_OTHER): Payer: Medicare HMO | Attending: Internal Medicine | Admitting: Internal Medicine

## 2021-04-02 ENCOUNTER — Other Ambulatory Visit: Payer: Self-pay

## 2021-04-02 DIAGNOSIS — L97812 Non-pressure chronic ulcer of other part of right lower leg with fat layer exposed: Secondary | ICD-10-CM | POA: Diagnosis not present

## 2021-04-02 DIAGNOSIS — I872 Venous insufficiency (chronic) (peripheral): Secondary | ICD-10-CM | POA: Diagnosis not present

## 2021-04-02 DIAGNOSIS — Z87891 Personal history of nicotine dependence: Secondary | ICD-10-CM | POA: Diagnosis not present

## 2021-04-02 DIAGNOSIS — I2510 Atherosclerotic heart disease of native coronary artery without angina pectoris: Secondary | ICD-10-CM | POA: Diagnosis not present

## 2021-04-02 DIAGNOSIS — I4891 Unspecified atrial fibrillation: Secondary | ICD-10-CM | POA: Insufficient documentation

## 2021-04-02 DIAGNOSIS — I87331 Chronic venous hypertension (idiopathic) with ulcer and inflammation of right lower extremity: Secondary | ICD-10-CM | POA: Insufficient documentation

## 2021-04-02 DIAGNOSIS — N400 Enlarged prostate without lower urinary tract symptoms: Secondary | ICD-10-CM | POA: Diagnosis not present

## 2021-04-02 DIAGNOSIS — I509 Heart failure, unspecified: Secondary | ICD-10-CM | POA: Insufficient documentation

## 2021-04-02 DIAGNOSIS — I110 Hypertensive heart disease with heart failure: Secondary | ICD-10-CM | POA: Diagnosis not present

## 2021-04-02 DIAGNOSIS — L97212 Non-pressure chronic ulcer of right calf with fat layer exposed: Secondary | ICD-10-CM | POA: Diagnosis not present

## 2021-04-02 NOTE — Progress Notes (Signed)
[name] [name], [name] [name] ([id number]) Visit Report for 04/02/2021 Allergy List Details Patient Name: Date of Service: [name] [name], [name] [name] 04/02/2021 1:15 PM Medical Record Number: [id number] Patient Account Number: [id number] Date of Birth/Sex: Treating RN: 21-Apr-1925 (87 y.o. Ernestene Mention Primary Care Rashae Rother: [name] Other Clinician: Referring Ernisha Sorn: Treating Carry Ortez/Extender: Carley Hammed in Treatment: 0 Allergies Active Allergies No Known Allergies Allergy Notes Electronic Signature(s) Signed: 04/02/2021 5:34:53 PM By: Baruch Gouty RN, BSN Entered By: Baruch Gouty on 04/02/2021 14:14:04 -------------------------------------------------------------------------------- Arrival Information Details Patient Name: Date of Service: [name] [name], [name] [name] 04/02/2021 1:15 PM Medical Record Number: [id number] Patient Account Number: [id number] Date of Birth/Sex: Treating RN: 1924-09-12 (87 y.o. Ernestene Mention Primary Care Trey Bebee: [name] Other Clinician: Referring Yolette Hastings: Treating Codee Bloodworth/Extender: Carley Hammed in Treatment: 0 Visit Information Patient Arrived: Wheel Chair Arrival Time: 14:09 Accompanied By: self Transfer Assistance: Manual Patient Identification Verified: Yes Secondary Verification Process Completed: Yes Patient Requires Transmission-Based Precautions: No Patient Has Alerts: Yes Patient Alerts: DNR Electronic Signature(s) Signed: 04/02/2021 5:34:53 PM By: Baruch Gouty RN, BSN Entered By: Baruch Gouty on 04/02/2021 14:14:44 -------------------------------------------------------------------------------- Clinic Level of Care Assessment Details Patient Name: Date of Service: [name] [name], [name] [name] 04/02/2021 1:15 PM Medical Record Number: [id number] Patient Account Number: [id number] Date of Birth/Sex: Treating RN: June 12, 1925 (87 y.o. Ernestene Mention Primary Care Oveda Dadamo:  [name] Other Clinician: Referring Monserath Neff: Treating Unice Vantassel/Extender: Carley Hammed in Treatment: 0 Clinic Level of Care Assessment Items TOOL 1 Quantity Score []  - 0 Use when EandM and Procedure is performed on INITIAL visit ASSESSMENTS - Nursing Assessment / Reassessment X- 1 20 General Physical Exam (combine w/ comprehensive assessment (listed just below) when performed on new pt. evals) X- 1 25 Comprehensive Assessment (HX, ROS, Risk Assessments, Wounds Hx, etc.) ASSESSMENTS - Wound and Skin Assessment / Reassessment []  - 0 Dermatologic / Skin Assessment (not related to wound area) ASSESSMENTS - Ostomy and/or Continence Assessment and Care []  - 0 Incontinence Assessment and Management []  - 0 Ostomy Care Assessment and Management (repouching, etc.) PROCESS - Coordination of Care X - Simple Patient / Family Education for ongoing care 1 15 []  - 0 Complex (extensive) Patient / Family Education for ongoing care X- 1 10 Staff obtains Programmer, systems, Records, T Results / Process Orders est X- 1 10 Staff telephones HHA, Nursing Homes / Clarify orders / etc []  - 0 Routine Transfer to another Facility (non-emergent condition) []  - 0 Routine Hospital Admission (non-emergent condition) X- 1 15 New Admissions / Biomedical engineer / Ordering NPWT Apligraf, etc. , []  - 0 Emergency Hospital Admission (emergent condition) PROCESS - Special Needs []  - 0 Pediatric / Minor Patient Management []  - 0 Isolation Patient Management []  - 0 Hearing / Language / Visual special needs []  - 0 Assessment of Community assistance (transportation, D/C planning, etc.) []  - 0 Additional assistance / Altered mentation []  - 0 Support Surface(s) Assessment (bed, cushion, seat, etc.) INTERVENTIONS - Miscellaneous []  - 0 External ear exam []  - 0 Patient Transfer (multiple staff / Civil Service fast streamer / Similar devices) []  - 0 Simple Staple / Suture removal (25 or less) []  -  0 Complex Staple / Suture removal (26 or more) []  - 0 Hypo/Hyperglycemic Management (do not check if billed separately) X- 1 15 Ankle / Brachial Index (ABI) - do not check if billed separately Has the patient been seen at the hospital within the last three years: Yes Total Score: 110 Level Of  Care: New/Established - Level 3 Electronic Signature(s) Signed: 04/02/2021 5:34:53 PM By: Baruch Gouty RN, BSN Entered By: Baruch Gouty on 04/02/2021 15:33:22 -------------------------------------------------------------------------------- Compression Therapy Details Patient Name: Date of Service: [name] [name], [name] [name] 04/02/2021 1:15 PM Medical Record Number: [id number] Patient Account Number: [id number] Date of Birth/Sex: Treating RN: Jun 25, 1924 (87 y.o. Ernestene Mention Primary Care Wenda Vanschaick: [name] Other Clinician: Referring Ashleyanne Hemmingway: Treating Yan Okray/Extender: Carley Hammed in Treatment: 0 Compression Therapy Performed for Wound Assessment: Wound #1 Right,Posterior Lower Leg Performed By: Clinician Baruch Gouty, RN Compression Type: Three Layer Post Procedure Diagnosis Same as Pre-procedure Electronic Signature(s) Signed: 04/02/2021 5:34:53 PM By: Baruch Gouty RN, BSN Entered By: Baruch Gouty on 04/02/2021 15:36:03 -------------------------------------------------------------------------------- Encounter Discharge Information Details Patient Name: Date of Service: [name] [name], [name] [name] 04/02/2021 1:15 PM Medical Record Number: [id number] Patient Account Number: [id number] Date of Birth/Sex: Treating RN: Feb 19, 1925 (87 y.o. Ernestene Mention Primary Care Mahir Prabhakar: [name] Other Clinician: Referring Briley Sulton: Treating Baylen Dea/Extender: Carley Hammed in Treatment: 0 Encounter Discharge Information Items Post Procedure Vitals Discharge Condition: Stable Temperature (F): 98 Ambulatory Status:  Wheelchair Pulse (bpm): 58 Discharge Destination: [place] Respiratory Rate (breaths/min): 18 Telephoned: No Blood Pressure (mmHg): 177/74 Orders Sent: Yes Transportation: Other Accompanied By: self Schedule Follow-up Appointment: Yes Clinical Summary of Care: Patient Declined Notes facility transportation Electronic Signature(s) Signed: 04/02/2021 5:34:53 PM By: Baruch Gouty RN, BSN Entered By: Baruch Gouty on 04/02/2021 16:10:45 -------------------------------------------------------------------------------- Lower Extremity Assessment Details Patient Name: Date of Service: [name] [name], [name] [name] 04/02/2021 1:15 PM Medical Record Number: [id number] Patient Account Number: [id number] Date of Birth/Sex: Treating RN: [birth year] (87 y.o. Ernestene Mention Primary Care Izic Stfort: [name] Other Clinician: Referring Tecla Mailloux: Treating Caidence Kaseman/Extender: Carley Hammed in Treatment: 0 Edema Assessment Assessed: Shirlyn Goltz: No] [Right: No] E[Left: dema] [Right: :] Calf Left: Right: Point of Measurement: From Medial Instep 36.7 cm Ankle Left: Right: Point of Measurement: From Medial Instep 22.4 cm Vascular Assessment Pulses: Dorsalis Pedis Palpable: [Right:No] Blood Pressure: Brachial: [Right:177] Dorsalis Pedis: 168 Ankle: [Right:Posterior Tibial: 182 1.03] Electronic Signature(s) Signed: 04/02/2021 5:34:53 PM By: Baruch Gouty RN, BSN Entered By: Baruch Gouty on 04/02/2021 14:47:05 -------------------------------------------------------------------------------- Multi Wound Chart Details Patient Name: Date of Service: [name] [name]. 04/02/2021 1:15 PM Medical Record Number: [id number] Patient Account Number: [id number] Date of Birth/Sex: Treating RN: [birth year] (87 y.o. [name], [name] Primary Care Takia Runyon: [name] Other Clinician: Referring Ivone Licht: Treating Verdell Kincannon/Extender: Carley Hammed in Treatment: 0 Vital Signs Height(in): 68 Pulse(bpm): 35 Weight(lbs): 170 Blood Pressure(mmHg): 177/74 Body Mass Index(BMI): 26 Temperature(F): 98 Respiratory Rate(breaths/min): 18 Photos: [N/A:N/A] Right, Posterior Lower Leg N/A N/A Wound Location: Gradually Appeared N/A N/A Wounding Event: Venous Leg Ulcer N/A N/A Primary Etiology: Congestive Heart Failure, Coronary N/A N/A Comorbid History: Artery Disease, Hypertension, Peripheral Venous Disease, Osteoarthritis 03/19/2021 N/A N/A Date Acquired: 0 N/A N/A Weeks of Treatment: Open N/A N/A Wound Status: 7x4.7x0.1 N/A N/A Measurements L x W x D (cm) 25.84 N/A N/A A (cm) : rea 2.584 N/A N/A Volume (cm) : 0.00% N/A N/A % Reduction in Area: 0.00% N/A N/A % Reduction in Volume: Full Thickness Without Exposed N/A N/A Classification: Support Structures Medium N/A N/A Exudate A mount: Serous N/A N/A Exudate Type: amber N/A N/A Exudate Color: Flat and Intact N/A N/A Wound Margin: Small (1-33%) N/A N/A Granulation A mount: Pink N/A N/A Granulation Quality: Large (67-100%) N/A N/A Necrotic A mount: Fat Layer (Subcutaneous Tissue): Yes N/A  N/A Exposed Structures: Fascia: No Tendon: No Muscle: No Joint: No Bone: No Small (1-33%) N/A N/A Epithelialization: Debridement - Excisional N/A N/A Debridement: Pre-procedure Verification/Time Out 15:30 N/A N/A Taken: Lidocaine 4% Topical Solution N/A N/A Pain Control: Subcutaneous, Slough N/A N/A Tissue Debrided: Skin/Subcutaneous Tissue N/A N/A Level: 32.9 N/A N/A Debridement A (sq cm): rea Curette N/A N/A Instrument: Minimum N/A N/A Bleeding: Pressure N/A N/A Hemostasis A chieved: 6 N/A N/A Procedural Pain: 3 N/A N/A Post Procedural Pain: Procedure was tolerated well N/A N/A Debridement Treatment Response: 7x4.7x0.1 N/A N/A Post Debridement Measurements L x W x D (cm) 2.584 N/A N/A Post Debridement Volume:  (cm) Compression Therapy N/A N/A Procedures Performed: Debridement Treatment Notes Electronic Signature(s) Signed: 04/02/2021 4:50:06 PM By: Rhae Hammock RN Signed: 04/02/2021 4:56:56 PM By: Linton Ham MD Entered By: Linton Ham on 04/02/2021 15:44:57 -------------------------------------------------------------------------------- Multi-Disciplinary Care Plan Details Patient Name: Date of Service: [name] [name], [name] [name] 04/02/2021 1:15 PM Medical Record Number: [id number] Patient Account Number: [id number] Date of Birth/Sex: Treating RN: Jul 22, 1924 (87 y.o. Ernestene Mention Primary Care Aamani Moose: [name] Other Clinician: Referring [place]a Hedman: Treating Carin Shipp/Extender: Carley Hammed in Treatment: 0 Multidisciplinary Care Plan reviewed with physician Active Inactive Venous Leg Ulcer Nursing Diagnoses: Knowledge deficit related to disease process and management Potential for venous Insuffiency (use before diagnosis confirmed) Goals: Patient will maintain optimal edema control Date Initiated: 04/02/2021 Target Resolution Date: 04/30/2021 Goal Status: Active Interventions: Assess peripheral edema status every visit. Compression as ordered Provide education on venous insufficiency Treatment Activities: Therapeutic compression applied : 04/02/2021 Notes: Wound/Skin Impairment Nursing Diagnoses: Impaired tissue integrity Knowledge deficit related to ulceration/compromised skin integrity Goals: Patient/caregiver will verbalize understanding of skin care regimen Date Initiated: 04/02/2021 Target Resolution Date: 04/30/2021 Goal Status: Active Ulcer/skin breakdown will have a volume reduction of 30% by week 4 Date Initiated: 04/02/2021 Target Resolution Date: 04/30/2021 Goal Status: Active Interventions: Assess patient/caregiver ability to obtain necessary supplies Assess patient/caregiver ability to perform ulcer/skin care  regimen upon admission and as needed Assess ulceration(s) every visit Provide education on ulcer and skin care Treatment Activities: Skin care regimen initiated : 04/02/2021 Topical wound management initiated : 04/02/2021 Notes: Electronic Signature(s) Signed: 04/02/2021 5:34:53 PM By: Baruch Gouty RN, BSN Entered By: Baruch Gouty on 04/02/2021 15:32:19 -------------------------------------------------------------------------------- Pain Assessment Details Patient Name: Date of Service: [name] [name] 04/02/2021 1:15 PM Medical Record Number: [id number] Patient Account Number: [id number] Date of Birth/Sex: Treating RN: Oct 25, 1924 (87 y.o. Ernestene Mention Primary Care Aiyannah Fayad: [name] Other Clinician: Referring Lancelot Alyea: Treating Kailash Hinze/Extender: Carley Hammed in Treatment: 0 Active Problems Location of Pain Severity and Description of Pain Patient Has Paino No Site Locations With Dressing Change: Yes Duration of the Pain. Constant / Intermittento Intermittent Rate the pain. Current Pain Level: 0 Worst Pain Level: 4 Least Pain Level: 0 Character of Pain Describe the Pain: Tender Pain Management and Medication Current Pain Management: Activity: Yes Is the Current Pain Management Adequate: Adequate How does your wound impact your activities of daily livingo Sleep: No Bathing: No Appetite: No Relationship With Others: No Bladder Continence: No Emotions: No Bowel Continence: No Work: No Toileting: No Drive: No Dressing: No Hobbies: No Electronic Signature(s) Signed: 04/02/2021 5:34:53 PM By: Baruch Gouty RN, BSN Entered By: Baruch Gouty on 04/02/2021 14:59:15 -------------------------------------------------------------------------------- Patient/Caregiver Education Details Patient Name: Date of Service: [name] [name] 10/18/2022andnbsp1:15 PM Medical Record Number: [id number] Patient Account Number:  [id number] Date of Birth/Gender: Treating RN: 08/25/1924 (87 y.o. M)  Baruch Gouty Primary Care Physician: [name] Other Clinician: Referring Physician: Treating Physician/Extender: Carley Hammed in Treatment: 0 Education Assessment Education Provided To: Patient Education Topics Provided Venous: Methods: Explain/Verbal Responses: Reinforcements needed, State content correctly Wound/Skin Impairment: Methods: Explain/Verbal Responses: Reinforcements needed, State content correctly Electronic Signature(s) Signed: 04/02/2021 5:34:53 PM By: Baruch Gouty RN, BSN Entered By: Baruch Gouty on 04/02/2021 15:32:35 -------------------------------------------------------------------------------- Wound Assessment Details Patient Name: Date of Service: [name] [name] 04/02/2021 1:15 PM Medical Record Number: [id number] Patient Account Number: [id number] Date of Birth/Sex: Treating RN: 08-09-1924 (87 y.o. Ernestene Mention Primary Care Damarcus Reggio: [name] Other Clinician: Referring Ashtian Villacis: Treating Lavonia Eager/Extender: Carley Hammed in Treatment: 0 Wound Status Wound Number: 1 Primary Venous Leg Ulcer Etiology: Wound Location: Right, Posterior Lower Leg Wound Open Wounding Event: Gradually Appeared Status: Date Acquired: 03/19/2021 Comorbid Congestive Heart Failure, Coronary Artery Disease, Weeks Of Treatment: 0 History: Hypertension, Peripheral Venous Disease, Osteoarthritis Clustered Wound: No Photos Wound Measurements Length: (cm) 7 Width: (cm) 4.7 Depth: (cm) 0.1 Area: (cm) 25.84 Volume: (cm) 2.584 % Reduction in Area: 0% % Reduction in Volume: 0% Epithelialization: Small (1-33%) Tunneling: No Undermining: No Wound Description Classification: Full Thickness Without Exposed Support Structures Wound Margin: Flat and Intact Exudate Amount: Medium Exudate Type: Serous Exudate Color: amber Foul  Odor After Cleansing: No Slough/Fibrino Yes Wound Bed Granulation Amount: Small (1-33%) Exposed Structure Granulation Quality: Pink Fascia Exposed: No Necrotic Amount: Large (67-100%) Fat Layer (Subcutaneous Tissue) Exposed: Yes Necrotic Quality: Adherent Slough Tendon Exposed: No Muscle Exposed: No Joint Exposed: No Bone Exposed: No Treatment Notes Wound #1 (Lower Leg) Wound Laterality: Right, Posterior Cleanser Peri-Wound Care Sween Lotion (Moisturizing lotion) Discharge Instruction: Apply moisturizing lotion to lower leg with dressing changes Topical Primary Dressing IODOFLEX 0.9% Cadexomer Iodine Pad 4x6 cm Discharge Instruction: or iodosorb gel Apply to wound bed as instructed Secondary Dressing ABD Pad, 8x10 Discharge Instruction: Apply over primary dressing as directed. Secured With Compression Wrap ThreePress (3 layer compression wrap) Discharge Instruction: Apply three layer compression as directed. Compression Stockings Add-Ons Electronic Signature(s) Signed: 04/02/2021 4:32:39 PM By: Lorrin Jackson Signed: 04/02/2021 5:34:53 PM By: Baruch Gouty RN, BSN Entered By: Lorrin Jackson on 04/02/2021 14:57:34 -------------------------------------------------------------------------------- [place] Details Patient Name: Date of Service: [name] [name], [name] [name] 04/02/2021 1:15 PM Medical Record Number: [id number] Patient Account Number: [id number] Date of Birth/Sex: Treating RN: 08-Oct-1924 (87 y.o. Ernestene Mention Primary Care Thanh Pomerleau: [name] Other Clinician: Referring Zaryah Seckel: Treating Maddison Kilner/Extender: Carley Hammed in Treatment: 0 Vital Signs Time Taken: 14:20 Temperature (F): 98 Height (in): 68 Pulse (bpm): 58 Source: Stated Respiratory Rate (breaths/min): 18 Weight (lbs): 170 Blood Pressure (mmHg): 177/74 Source: Stated Reference Range: 80 - 120 mg / dl Body Mass Index (BMI): 25.8 Electronic Signature(s) Signed:  04/02/2021 5:34:53 PM By: Baruch Gouty RN, BSN Entered By: Baruch Gouty on 04/02/2021 14:23:31

## 2021-04-02 NOTE — Progress Notes (Signed)
[name] [name], [name] [name] ([id number]) Visit Report for 04/02/2021 Abuse/Suicide Risk Screen Details Patient Name: Date of Service: [name] [name], [name] [name] 04/02/2021 1:15 PM Medical Record Number: [id number] Patient Account Number: [id number] Date of Birth/Sex: Treating RN: 30-Aug-1924 (87 y.o. Ernestene Mention Primary Care Laqueisha Catalina: [name] Other Clinician: Referring Demetry Bendickson: Treating Karsten Vaughn/Extender: Carley Hammed in Treatment: 0 Abuse/Suicide Risk Screen Items Answer ABUSE RISK SCREEN: Has anyone close to you tried to hurt or harm you recentlyo No Do you feel uncomfortable with anyone in your familyo No Has anyone forced you do things that you didnt want to doo No Electronic Signature(s) Signed: 04/02/2021 5:34:53 PM By: Baruch Gouty RN, BSN Entered By: Baruch Gouty on 04/02/2021 14:36:49 -------------------------------------------------------------------------------- Activities of Daily Living Details Patient Name: Date of Service: [name] [name], [name] [name] 04/02/2021 1:15 PM Medical Record Number: [id number] Patient Account Number: [id number] Date of Birth/Sex: Treating RN: September 17, 1924 (87 y.o. Ernestene Mention Primary Care Inri Sobieski: [name] Other Clinician: Referring Geronimo Diliberto: Treating Fuquan Wilson/Extender: Carley Hammed in Treatment: 0 Activities of Daily Living Items Answer Activities of Daily Living (Please select one for each item) Drive Automobile Not Able T Medications ake Need Assistance Use T elephone Completely Able Care for Appearance Need Assistance Use T oilet Need Assistance Bath / Shower Need Assistance Dress Self Need Assistance Feed Self Completely Able Walk Not Able Get In / Out Bed Need Assistance Housework Not Able Prepare Meals Not Able Handle Money Need Assistance Shop for Self Not Able Electronic Signature(s) Signed: 04/02/2021 5:34:53 PM By: Baruch Gouty RN, BSN Entered By:  Baruch Gouty on 04/02/2021 14:37:29 -------------------------------------------------------------------------------- Education Screening Details Patient Name: Date of Service: [name] [name] 04/02/2021 1:15 PM Medical Record Number: [id number] Patient Account Number: [id number] Date of Birth/Sex: Treating RN: 1924/09/19 (87 y.o. Ernestene Mention Primary Care Arno Cullers: [name] Other Clinician: Referring Jadwiga Faidley: Treating Zeinab Rodwell/Extender: Carley Hammed in Treatment: 0 Primary Learner Assessed: Patient Learning Preferences/Education Level/Primary Language Learning Preference: Explanation, Demonstration, Printed Material Highest Education Level: College or Above Preferred Language: English Cognitive Barrier Language Barrier: No Translator Needed: No Memory Deficit: No Emotional Barrier: No Cultural/Religious Beliefs Affecting Medical Care: No Physical Barrier Impaired Vision: No Impaired Hearing: Yes very hard of hearing Knowledge/Comprehension Knowledge Level: High Comprehension Level: High Ability to understand written instructions: High Ability to understand verbal instructions: High Motivation Anxiety Level: Calm Cooperation: Cooperative Education Importance: Acknowledges Need Interest in Health Problems: Asks Questions Perception: Coherent Willingness to Engage in Self-Management High Activities: Readiness to Engage in Self-Management High Activities: Electronic Signature(s) Signed: 04/02/2021 5:34:53 PM By: Baruch Gouty RN, BSN Entered By: Baruch Gouty on 04/02/2021 14:38:07 -------------------------------------------------------------------------------- Fall Risk Assessment Details Patient Name: Date of Service: [name] [name] 04/02/2021 1:15 PM Medical Record Number: [id number] Patient Account Number: [id number] Date of Birth/Sex: Treating RN: February 01, 1925 (87 y.o. Ernestene Mention Primary Care  Jolea Dolle: [name] Other Clinician: Referring Lavaeh Bau: Treating Eran Windish/Extender: Carley Hammed in Treatment: 0 Fall Risk Assessment Items Have you had 2 or more falls in the last 12 monthso 0 No Have you had any fall that resulted in injury in the last 12 monthso 0 No FALLS RISK SCREEN History of falling - immediate or within 3 months 0 No Secondary diagnosis (Do you have 2 or more medical diagnoseso) 15 Yes Ambulatory aid None/bed rest/wheelchair/nurse 0 Yes Crutches/cane/walker 0 No Furniture 0 No Intravenous therapy Access/Saline/Heparin Lock 0 No Gait/Transferring Normal/ bed rest/ wheelchair 0 Yes Weak (short steps  with or without shuffle, stooped but able to lift head while walking, may seek 0 No support from furniture) Impaired (short steps with shuffle, may have difficulty arising from chair, head down, impaired 0 No balance) Mental Status Oriented to own ability 0 Yes Electronic Signature(s) Signed: 04/02/2021 5:34:53 PM By: Baruch Gouty RN, BSN Entered By: Baruch Gouty on 04/02/2021 14:39:23 -------------------------------------------------------------------------------- Foot Assessment Details Patient Name: Date of Service: [name] [name]. 04/02/2021 1:15 PM Medical Record Number: [id number] Patient Account Number: [id number] Date of Birth/Sex: Treating RN: 07-04-1924 (87 y.o. Ernestene Mention Primary Care Trystyn Sitts: [name] Other Clinician: Referring Harlei Lehrmann: Treating Sharea Guinther/Extender: Carley Hammed in Treatment: 0 Foot Assessment Items Site Locations + = Sensation present, - = Sensation absent, C = Callus, U = Ulcer R = Redness, W = Warmth, M = Maceration, PU = Pre-ulcerative lesion F = Fissure, S = Swelling, D = Dryness Assessment Right: Left: Other Deformity: No No Prior Foot Ulcer: No No Prior Amputation: No No Charcot Joint: No No Ambulatory Status: Non-ambulatory Assistance  Device: Wheelchair Gait: Electronic Signature(s) Signed: 04/02/2021 5:34:53 PM By: Baruch Gouty RN, BSN Entered By: Baruch Gouty on 04/02/2021 14:40:44 -------------------------------------------------------------------------------- Nutrition Risk Screening Details Patient Name: Date of Service: [name] [name], [name] [name] 04/02/2021 1:15 PM Medical Record Number: [id number] Patient Account Number: [id number] Date of Birth/Sex: Treating RN: March 13, 1925 (87 y.o. Ernestene Mention Primary Care Fatiha Guzy: [name] Other Clinician: Referring Junita Kubota: Treating Lemont Sitzmann/Extender: Carley Hammed in Treatment: 0 Height (in): 68 Weight (lbs): 170 Body Mass Index (BMI): 25.8 Nutrition Risk Screening Items Score Screening NUTRITION RISK SCREEN: I have an illness or condition that made me change the kind and/or amount of food I eat 0 No I eat fewer than two meals per day 0 No I eat few fruits and vegetables, or milk products 0 No I have three or more drinks of beer, liquor or wine almost every day 0 No I have tooth or mouth problems that make it hard for me to eat 0 No I don't always have enough money to buy the food I need 0 No I eat alone most of the time 0 No I take three or more different prescribed or over-the-counter drugs a day 1 Yes Without wanting to, I have lost or gained 10 pounds in the last six months 0 No I am not always physically able to shop, cook and/or feed myself 0 No Nutrition Protocols Good Risk Protocol 0 No interventions needed Moderate Risk Protocol High Risk Proctocol Risk Level: Good Risk Score: 1 Electronic Signature(s) Signed: 04/02/2021 5:34:53 PM By: Baruch Gouty RN, BSN Entered By: Baruch Gouty on 04/02/2021 14:40:25

## 2021-04-02 NOTE — Progress Notes (Signed)
[name] [name], [name] [name] ([id number]) Visit Report for 04/02/2021 Chief Complaint Document Details Patient Name: Date of Service: [name] [name], [name] [name] 04/02/2021 1:15 PM Medical Record Number: [id number] Patient Account Number: [id number] Date of Birth/Sex: Treating RN: [birth year] (87 y.o. [name] [name], [name] [name] Primary Care Provider: Veleta [name] Other Clinician: Referring Provider: Treating Provider/Extender: Carrie Mew, [name] [name] in Treatment: 0 Information Obtained from: Patient Chief Complaint 04/02/2021; patient is here for review of a wound on the right posterior calf in the setting of chronic venous stasis Electronic Signature(s) Signed: 04/02/2021 4:56:56 PM By: [name] Ham MD Entered By: [name] [name] on 04/02/2021 15:46:07 -------------------------------------------------------------------------------- Debridement Details Patient Name: Date of Service: [name] [name]. 04/02/2021 1:15 PM Medical Record Number: [id number] Patient Account Number: [id number] Date of Birth/Sex: Treating RN: [birth year] (87 y.o. [name] [name], [name] [name] Primary Care Provider: Veleta [name] Other Clinician: Referring Provider: Treating Provider/Extender: [name] [name] in Treatment: 0 Debridement Performed for Assessment: Wound #1 Right,Posterior Lower Leg Performed By: Physician [name] [name]., MD Debridement Type: Debridement Severity of Tissue Pre Debridement: Fat layer exposed Level of Consciousness (Pre-procedure): Awake and Alert Pre-procedure Verification/Time Out Yes - 15:30 Taken: Start Time: 15:31 Pain Control: Lidocaine 4% T opical Solution T Area Debrided (L x W): otal 7 (cm) x 4.7 (cm) = 32.9 (cm) Tissue and other material debrided: Viable, Non-Viable, Slough, Subcutaneous, Slough Level: Skin/Subcutaneous Tissue Debridement Description: Excisional Instrument: Curette Bleeding: Minimum Hemostasis Achieved: Pressure End Time:  15:35 Procedural Pain: 6 Post Procedural Pain: 3 Response to Treatment: Procedure was tolerated well Level of Consciousness (Post- Awake and Alert procedure): Post Debridement Measurements of Total Wound Length: (cm) 7 Width: (cm) 4.7 Depth: (cm) 0.1 Volume: (cm) 2.584 Character of Wound/Ulcer Post Debridement: Improved Severity of Tissue Post Debridement: Fat layer exposed Post Procedure Diagnosis Same as Pre-procedure Electronic Signature(s) Signed: 04/02/2021 4:50:06 PM By: [name] Hammock RN Signed: 04/02/2021 4:56:56 PM By: [name] Ham MD Entered By: [name] [name] on 04/02/2021 15:45:13 -------------------------------------------------------------------------------- HPI Details Patient Name: Date of Service: [name] [name]. 04/02/2021 1:15 PM Medical Record Number: [id number] Patient Account Number: [id number] Date of Birth/Sex: Treating RN: [name] [name] 07, 1926 (87 y.o. [name] [name], [name] [name] Primary Care Provider: Veleta [name] Other Clinician: Referring Provider: Treating Provider/Extender: [name] [name] in Treatment: 0 History of Present Illness HPI Description: ADMISSION 04/02/2021 This is a pleasant 87 year old man sent to our clinic by Dr. Lyndel [name] from the skilled unit of [facility] [place]. He has a wound on the right posterior calf. I am uncertain about the duration of this however it looks to be chronic. They are using Santyl covered with silver alginate he does not have any compression. The wound is 100% covered in very fibrinous adherent slough. It also appears that this is quite painful. The patient is exceptionally hard of hearing so it is difficult to get any additional history out of him. Past medical history includes hypertension, coronary artery disease, congestive heart failure, BPH, seizure disorder. He is nonambulatory for reasons that are not totally clear. [facility] [place]. ABI in our  clinic was 1.03 on the right Electronic Signature(s) Signed: 04/02/2021 4:56:56 PM By: [name] Ham MD Entered By: [name] [name] on 04/02/2021 15:52:31 -------------------------------------------------------------------------------- Physician Orders Details Patient Name: Date of Service: [name] [name], [name] [name] 04/02/2021 1:15 PM Medical Record Number: [id number] Patient Account Number: [id number] Date of Birth/Sex: Treating RN: Feb 28, 1925 (87 y.o. [name] [name] Primary Care Provider: Veleta [name] Other Clinician: Referring Provider: Treating Provider/Extender:  Lynelle Doctor Weeks in Treatment: 0 Verbal / Phone Orders: No Diagnosis Coding Follow-up Appointments Return Appointment in 2 weeks. Bathing/ Shower/ Hygiene May shower with protection but do not get wound dressing(s) wet. Edema Control - Lymphedema / SCD / Other Bilateral Lower Extremities Elevate legs to the level of the heart or above for 30 minutes daily and/or when sitting, a frequency of: - 5 times per day Exercise regularly Wound Treatment Wound #1 - Lower Leg Wound Laterality: Right, Posterior Peri-Wound Care: Sween Lotion (Moisturizing lotion) 3 x Per Week/30 Days Discharge Instructions: Apply moisturizing lotion to lower leg with dressing changes Prim Dressing: IODOFLEX 0.9% Cadexomer Iodine Pad 4x6 cm 3 x Per Week/30 Days ary Discharge Instructions: or iodosorb gel Apply to wound bed as instructed Secondary Dressing: ABD Pad, 8x10 3 x Per Week/30 Days Discharge Instructions: Apply over primary dressing as directed. Compression Wrap: ThreePress (3 layer compression wrap) 3 x Per Week/30 Days Discharge Instructions: Apply three layer compression as directed. Patient Medications llergies: No Known Allergies A Notifications Medication Indication Start End prior to debridement 04/02/2021 lidocaine DOSE topical 4 % cream - cream topical Electronic Signature(s) Signed: 04/02/2021 4:56:56  PM By: [name] Ham MD Signed: 04/02/2021 5:34:53 PM By: Baruch Gouty RN, BSN Entered By: Baruch Gouty on 04/02/2021 15:40:28 -------------------------------------------------------------------------------- Problem List Details Patient Name: Date of Service: [name] [name], [name] [name] 04/02/2021 1:15 PM Medical Record Number: [id number] Patient Account Number: [id number] Date of Birth/Sex: Treating RN: 24-Nov-1924 (87 y.o. [name] [name], [name] [name] Primary Care Provider: Veleta [name] Other Clinician: Referring Provider: Treating Provider/Extender: [name] [name] in Treatment: 0 Active Problems ICD-10 Encounter Code Description Active Date MDM Diagnosis I87.331 Chronic venous hypertension (idiopathic) with ulcer and inflammation of right 04/02/2021 No Yes lower extremity L97.218 Non-pressure chronic ulcer of right calf with other specified severity 04/02/2021 No Yes Inactive Problems Resolved Problems Electronic Signature(s) Signed: 04/02/2021 4:56:56 PM By: [name] Ham MD Entered By: [name] [name] on 04/02/2021 15:44:39 -------------------------------------------------------------------------------- Progress Note Details Patient Name: Date of Service: [name] [name]. 04/02/2021 1:15 PM Medical Record Number: [id number] Patient Account Number: [id number] Date of Birth/Sex: Treating RN: Oct 19, 1924 (87 y.o. [name] [name], [name] [name] Primary Care Provider: Veleta [name] Other Clinician: Referring Provider: Treating Provider/Extender: Carrie Mew, [name] [name] in Treatment: 0 Subjective Chief Complaint Information obtained from Patient 04/02/2021; patient is here for review of a wound on the right posterior calf in the setting of chronic venous stasis History of Present Illness (HPI) ADMISSION 04/02/2021 This is a pleasant 87 year old man sent to our clinic by Dr. Lyndel [name] from the [facility] of [facility] [place]. He has a wound on  the right posterior calf. I am uncertain about the duration of this however it looks to be chronic. They are using Santyl covered with silver alginate he does not have any compression. The wound is 100% covered in very fibrinous adherent slough. It also appears that this is quite painful. The patient is exceptionally hard of hearing so it is difficult to get any additional history out of him. Past medical history includes hypertension, coronary artery disease, congestive heart failure, BPH, seizure disorder. He is nonambulatory for reasons that are not totally clear. [facility] lives at [facility] [place]. ABI in our clinic was 1.03 on the right Patient History Information obtained from Chart. Allergies No Known Allergies Family History Unknown History. Social History Former smoker, Marital Status - Widowed, Alcohol Use - Never, Drug Use - No History. Medical History Ear/Nose/Mouth/Throat  Denies history of Chronic sinus problems/congestion, Middle ear problems Cardiovascular Patient has history of Arrhythmia - afib, Congestive Heart Failure, Coronary Artery Disease, Hypertension, Peripheral Venous Disease Endocrine Denies history of Type I Diabetes, Type II Diabetes Genitourinary Denies history of End Stage Renal Disease Integumentary (Skin) Denies history of History of Burn Musculoskeletal Patient has history of Osteoarthritis Denies history of Gout, Rheumatoid Arthritis, Osteomyelitis Neurologic Patient has history of Seizure Disorder Oncologic Denies history of Received Chemotherapy, Received Radiation Psychiatric Denies history of Anorexia/bulimia, Confinement Anxiety Hospitalization/Surgery History - appendectomy. - cataract extraction bilateral. - benign fatty tumor removal. - hemorroidectomy. - left hip arthroplasty. - inguinal hernia repair. - nasal septum surgery. - tonsillectomy. Medical A Surgical History Notes nd Ear/Nose/Mouth/Throat severe hard of  hearing Cardiovascular hyperlipidemia, mitral valve regurgitation Gastrointestinal reflux esophagitis, internal hemorrhoids, colon polyps Genitourinary BPH Review of Systems (ROS) Constitutional Symptoms (General Health) Denies complaints or symptoms of Fatigue, Fever, Chills, Marked Weight Change. Eyes Complains or has symptoms of Glasses / Contacts. Ear/Nose/Mouth/Throat Denies complaints or symptoms of Chronic sinus problems or rhinitis. Respiratory Denies complaints or symptoms of Chronic or frequent coughs, Shortness of Breath. Gastrointestinal Denies complaints or symptoms of Frequent diarrhea, Nausea, Vomiting. Endocrine Denies complaints or symptoms of Heat/cold intolerance. Genitourinary Denies complaints or symptoms of Frequent urination. Integumentary (Skin) Complains or has symptoms of Wounds - right lower leg. Musculoskeletal Complains or has symptoms of Muscle Weakness. Denies complaints or symptoms of Muscle Pain. Neurologic Denies complaints or symptoms of Numbness/parasthesias. Psychiatric Denies complaints or symptoms of Claustrophobia, Suicidal. Objective Constitutional Vitals Time Taken: 2:20 PM, Height: 68 in, Source: Stated, Weight: 170 lbs, Source: Stated, BMI: 25.8, Temperature: 98 F, Pulse: 58 bpm, Respiratory Rate: 18 breaths/min, Blood Pressure: 177/74 mmHg. Integumentary (Hair, Skin) Wound #1 status is Open. Original cause of wound was Gradually Appeared. The date acquired was: 03/19/2021. The wound is located on the Right,Posterior Lower Leg. The wound measures 7cm length x 4.7cm width x 0.1cm depth; 25.84cm^2 area and 2.584cm^3 volume. There is Fat Layer (Subcutaneous Tissue) exposed. There is no tunneling or undermining noted. There is a medium amount of serous drainage noted. The wound margin is flat and intact. There is small (1-33%) pink granulation within the wound bed. There is a large (67-100%) amount of necrotic tissue within the wound bed  including Adherent Slough. Assessment Active Problems ICD-10 Chronic venous hypertension (idiopathic) with ulcer and inflammation of right lower extremity Non-pressure chronic ulcer of right calf with other specified severity Procedures Wound #1 Pre-procedure diagnosis of Wound #1 is a Venous Leg Ulcer located on the Right,Posterior Lower Leg .Severity of Tissue Pre Debridement is: Fat layer exposed. There was a Excisional Skin/Subcutaneous Tissue Debridement with a total area of 32.9 sq cm performed by [name] [name]., MD. With the following instrument(s): Curette to remove Viable and Non-Viable tissue/material. Material removed includes Subcutaneous Tissue and Slough and after achieving pain control using Lidocaine 4% Topical Solution. No specimens were taken. A time out was conducted at 15:30, prior to the start of the procedure. A Minimum amount of bleeding was controlled with Pressure. The procedure was tolerated well with a pain level of 6 throughout and a pain level of 3 following the procedure. Post Debridement Measurements: 7cm length x 4.7cm width x 0.1cm depth; 2.584cm^3 volume. Character of Wound/Ulcer Post Debridement is improved. Severity of Tissue Post Debridement is: Fat layer exposed. Post procedure Diagnosis Wound #1: Same as Pre-Procedure Pre-procedure diagnosis of Wound #1 is a Venous Leg Ulcer located on the Right,Posterior  Lower Leg . There was a Three Layer Compression Therapy Procedure by Baruch Gouty, RN. Post procedure Diagnosis Wound #1: Same as Pre-Procedure Plan Follow-up Appointments: Return Appointment in 2 weeks. Bathing/ Shower/ Hygiene: May shower with protection but do not get wound dressing(s) wet. Edema Control - Lymphedema / SCD / Other: Elevate legs to the level of the heart or above for 30 minutes daily and/or when sitting, a frequency of: - 5 times per day Exercise regularly The following medication(s) was prescribed: lidocaine topical 4 %  cream cream topical for prior to debridement was prescribed at facility WOUND #1: - Lower Leg Wound Laterality: Right, Posterior Peri-Wound Care: Sween Lotion (Moisturizing lotion) 3 x Per Week/30 Days Discharge Instructions: Apply moisturizing lotion to lower leg with dressing changes Prim Dressing: IODOFLEX 0.9% Cadexomer Iodine Pad 4x6 cm 3 x Per Week/30 Days ary Discharge Instructions: or iodosorb gel Apply to wound bed as instructed Secondary Dressing: ABD Pad, 8x10 3 x Per Week/30 Days Discharge Instructions: Apply over primary dressing as directed. Com pression Wrap: ThreePress (3 layer compression wrap) 3 x Per Week/30 Days Discharge Instructions: Apply three layer compression as directed. 1. Completely necrotic surface on a fairly large wound on the right posterior calf. There is actually 2 wounds in close position and almost a figure 8 configuration. Very significant edema in the lower leg with hemosiderin deposition and inflammation. The inflammation is warm but not tender I do not believe there is any cellulitis here. 2. Edema is pitting suggesting this is chronic venous insufficiency. It does not extend above his knees 3. I am going to use Iodoflex under compression. Iodoflex hopefully to help further ongoing debridement of the wound surface and compression to help with edema. We will liberally put TCA and moisturizer on the dry flaking erythematous skin in his right lower leg. I spent 35 minutes in review of this patient's past medical history, face-to-face evaluation and preparation of this note. We also spoke to his daughter on the phone who works at [employer]) Signed: 04/02/2021 4:56:56 PM By: [name] Ham MD Entered By: [name] [name] on 04/02/2021 15:54:21 -------------------------------------------------------------------------------- HxROS Details Patient Name: Date of Service: [name] [name], [name] [name] 04/02/2021 1:15 PM Medical Record  Number: [id number] Patient Account Number: [id number] Date of Birth/Sex: Treating RN: 06/18/1924 (87 y.o. [name] [name] Primary Care Provider: Veleta [name] Other Clinician: Referring Provider: Treating Provider/Extender: [name] [name] in Treatment: 0 Information Obtained From Chart Constitutional Symptoms (General Health) Complaints and Symptoms: Negative for: Fatigue; Fever; Chills; Marked Weight Change Eyes Complaints and Symptoms: Positive for: Glasses / Contacts Ear/Nose/Mouth/Throat Complaints and Symptoms: Negative for: Chronic sinus problems or rhinitis Medical History: Negative for: Chronic sinus problems/congestion; Middle ear problems Past Medical History Notes: severe hard of hearing Respiratory Complaints and Symptoms: Negative for: Chronic or frequent coughs; Shortness of Breath Gastrointestinal Complaints and Symptoms: Negative for: Frequent diarrhea; Nausea; Vomiting Medical History: Past Medical History Notes: reflux esophagitis, internal hemorrhoids, colon polyps Endocrine Complaints and Symptoms: Negative for: Heat/cold intolerance Medical History: Negative for: Type I Diabetes; Type II Diabetes Genitourinary Complaints and Symptoms: Negative for: Frequent urination Medical History: Negative for: End Stage Renal Disease Past Medical History Notes: BPH Integumentary (Skin) Complaints and Symptoms: Positive for: Wounds - right lower leg Medical History: Negative for: History of Burn Musculoskeletal Complaints and Symptoms: Positive for: Muscle Weakness Negative for: Muscle Pain Medical History: Positive for: Osteoarthritis Negative for: Gout; Rheumatoid Arthritis; Osteomyelitis Neurologic Complaints and Symptoms: Negative for: Numbness/parasthesias Medical History:  Positive for: Seizure Disorder Psychiatric Complaints and Symptoms: Negative for: Claustrophobia; Suicidal Medical History: Negative for:  Anorexia/bulimia; Confinement Anxiety Hematologic/Lymphatic Cardiovascular Medical History: Positive for: Arrhythmia - afib; Congestive Heart Failure; Coronary Artery Disease; Hypertension; Peripheral Venous Disease Past Medical History Notes: hyperlipidemia, mitral valve regurgitation Immunological Oncologic Medical History: Negative for: Received Chemotherapy; Received Radiation Immunizations Pneumococcal Vaccine: Received Pneumococcal Vaccination: Yes Received Pneumococcal Vaccination On or After 60th Birthday: Yes Implantable Devices No devices added Hospitalization / Surgery History Type of Hospitalization/Surgery appendectomy cataract extraction bilateral benign fatty tumor removal hemorroidectomy left hip arthroplasty inguinal hernia repair nasal septum surgery tonsillectomy Family and Social History Unknown History: Yes; Former smoker; Marital Status - Widowed; Alcohol Use: Never; Drug Use: No History; Financial Concerns: No; Food, Clothing or Shelter Needs: No; Support System Lacking: No; Transportation Concerns: Yes - at [employer]) Signed: 04/02/2021 4:56:56 PM By: [name] Ham MD Signed: 04/02/2021 5:34:53 PM By: Baruch Gouty RN, BSN Entered By: Baruch Gouty on 04/02/2021 15:06:15 -------------------------------------------------------------------------------- [place] Details Patient Name: Date of Service: [name] [name] 04/02/2021 Medical Record Number: [id number] Patient Account Number: [id number] Date of Birth/Sex: Treating RN: 08-13-1924 (87 y.o. [name] [name], [name] [name] Primary Care Provider: Veleta [name] Other Clinician: Referring Provider: Treating Provider/Extender: [name] [name] in Treatment: 0 Diagnosis Coding ICD-10 Codes Code Description [id number] Chronic venous hypertension (idiopathic) with ulcer and inflammation of right lower extremity L97.218 Non-pressure chronic ulcer of right calf  with other specified severity Facility Procedures CPT4 Code: 17616073 Description: [employer] VISIT-LEV 3 EST PT Modifier: 25 Quantity: 1 CPT4 Code: [id number] Description: 85462 - DEB SUBQ TISSUE 20 SQ CM/< ICD-10 Diagnosis Description V03.500 Non-pressure chronic ulcer of right calf with other specified severity Modifier: Quantity: 1 CPT4 Code: [id number] Description: 37169 - DEB SUBQ TISS EA ADDL 20CM ICD-10 Diagnosis Description I87.331 Chronic venous hypertension (idiopathic) with ulcer and inflammation of right l Modifier: ower extremity Quantity: 1 Physician Procedures : CPT4 Code Description Modifier [id number] WC PHYS LEVEL 3 NEW PT 25 ICD-10 Diagnosis Description I87.331 Chronic venous hypertension (idiopathic) with ulcer and inflammation of right lower extremity L97.218 Non-pressure chronic ulcer of right calf with  other specified severity Quantity: 1 : [id number] 11042 - WC PHYS SUBQ TISS 20 SQ CM ICD-10 Diagnosis Description L97.218 Non-pressure chronic ulcer of right calf with other specified severity Quantity: 1 : [id number] 24235 - WC PHYS SUBQ TISS EA ADDL 20 CM ICD-10 Diagnosis Description I87.331 Chronic venous hypertension (idiopathic) with ulcer and inflammation of right lower extremity Quantity: 1 Electronic Signature(s) Signed: 04/02/2021 4:56:56 PM By: [name] Ham MD Signed: 04/02/2021 5:34:53 PM By: Baruch Gouty RN, BSN Entered By: Baruch Gouty on 04/02/2021 [place]

## 2021-04-03 DIAGNOSIS — R2681 Unsteadiness on feet: Secondary | ICD-10-CM | POA: Diagnosis not present

## 2021-04-03 DIAGNOSIS — M25652 Stiffness of left hip, not elsewhere classified: Secondary | ICD-10-CM | POA: Diagnosis not present

## 2021-04-03 DIAGNOSIS — M25661 Stiffness of right knee, not elsewhere classified: Secondary | ICD-10-CM | POA: Diagnosis not present

## 2021-04-03 DIAGNOSIS — R278 Other lack of coordination: Secondary | ICD-10-CM | POA: Diagnosis not present

## 2021-04-03 DIAGNOSIS — M25662 Stiffness of left knee, not elsewhere classified: Secondary | ICD-10-CM | POA: Diagnosis not present

## 2021-04-03 DIAGNOSIS — R1310 Dysphagia, unspecified: Secondary | ICD-10-CM | POA: Diagnosis not present

## 2021-04-03 DIAGNOSIS — R1312 Dysphagia, oropharyngeal phase: Secondary | ICD-10-CM | POA: Diagnosis not present

## 2021-04-03 DIAGNOSIS — M25651 Stiffness of right hip, not elsewhere classified: Secondary | ICD-10-CM | POA: Diagnosis not present

## 2021-04-03 DIAGNOSIS — K219 Gastro-esophageal reflux disease without esophagitis: Secondary | ICD-10-CM | POA: Diagnosis not present

## 2021-04-04 DIAGNOSIS — R2681 Unsteadiness on feet: Secondary | ICD-10-CM | POA: Diagnosis not present

## 2021-04-04 DIAGNOSIS — R1310 Dysphagia, unspecified: Secondary | ICD-10-CM | POA: Diagnosis not present

## 2021-04-04 DIAGNOSIS — M25651 Stiffness of right hip, not elsewhere classified: Secondary | ICD-10-CM | POA: Diagnosis not present

## 2021-04-04 DIAGNOSIS — K219 Gastro-esophageal reflux disease without esophagitis: Secondary | ICD-10-CM | POA: Diagnosis not present

## 2021-04-04 DIAGNOSIS — M25662 Stiffness of left knee, not elsewhere classified: Secondary | ICD-10-CM | POA: Diagnosis not present

## 2021-04-04 DIAGNOSIS — R1312 Dysphagia, oropharyngeal phase: Secondary | ICD-10-CM | POA: Diagnosis not present

## 2021-04-04 DIAGNOSIS — R278 Other lack of coordination: Secondary | ICD-10-CM | POA: Diagnosis not present

## 2021-04-04 DIAGNOSIS — M25661 Stiffness of right knee, not elsewhere classified: Secondary | ICD-10-CM | POA: Diagnosis not present

## 2021-04-04 DIAGNOSIS — M25652 Stiffness of left hip, not elsewhere classified: Secondary | ICD-10-CM | POA: Diagnosis not present

## 2021-04-05 DIAGNOSIS — M25652 Stiffness of left hip, not elsewhere classified: Secondary | ICD-10-CM | POA: Diagnosis not present

## 2021-04-05 DIAGNOSIS — R278 Other lack of coordination: Secondary | ICD-10-CM | POA: Diagnosis not present

## 2021-04-05 DIAGNOSIS — M25661 Stiffness of right knee, not elsewhere classified: Secondary | ICD-10-CM | POA: Diagnosis not present

## 2021-04-05 DIAGNOSIS — M25651 Stiffness of right hip, not elsewhere classified: Secondary | ICD-10-CM | POA: Diagnosis not present

## 2021-04-05 DIAGNOSIS — M25662 Stiffness of left knee, not elsewhere classified: Secondary | ICD-10-CM | POA: Diagnosis not present

## 2021-04-05 DIAGNOSIS — R1312 Dysphagia, oropharyngeal phase: Secondary | ICD-10-CM | POA: Diagnosis not present

## 2021-04-05 DIAGNOSIS — R2681 Unsteadiness on feet: Secondary | ICD-10-CM | POA: Diagnosis not present

## 2021-04-05 DIAGNOSIS — R1310 Dysphagia, unspecified: Secondary | ICD-10-CM | POA: Diagnosis not present

## 2021-04-05 DIAGNOSIS — K219 Gastro-esophageal reflux disease without esophagitis: Secondary | ICD-10-CM | POA: Diagnosis not present

## 2021-04-10 DIAGNOSIS — M25662 Stiffness of left knee, not elsewhere classified: Secondary | ICD-10-CM | POA: Diagnosis not present

## 2021-04-10 DIAGNOSIS — M25661 Stiffness of right knee, not elsewhere classified: Secondary | ICD-10-CM | POA: Diagnosis not present

## 2021-04-10 DIAGNOSIS — R1312 Dysphagia, oropharyngeal phase: Secondary | ICD-10-CM | POA: Diagnosis not present

## 2021-04-10 DIAGNOSIS — M25652 Stiffness of left hip, not elsewhere classified: Secondary | ICD-10-CM | POA: Diagnosis not present

## 2021-04-10 DIAGNOSIS — R278 Other lack of coordination: Secondary | ICD-10-CM | POA: Diagnosis not present

## 2021-04-10 DIAGNOSIS — R1310 Dysphagia, unspecified: Secondary | ICD-10-CM | POA: Diagnosis not present

## 2021-04-10 DIAGNOSIS — K219 Gastro-esophageal reflux disease without esophagitis: Secondary | ICD-10-CM | POA: Diagnosis not present

## 2021-04-10 DIAGNOSIS — M25651 Stiffness of right hip, not elsewhere classified: Secondary | ICD-10-CM | POA: Diagnosis not present

## 2021-04-10 DIAGNOSIS — R2681 Unsteadiness on feet: Secondary | ICD-10-CM | POA: Diagnosis not present

## 2021-04-11 DIAGNOSIS — M25661 Stiffness of right knee, not elsewhere classified: Secondary | ICD-10-CM | POA: Diagnosis not present

## 2021-04-11 DIAGNOSIS — M25662 Stiffness of left knee, not elsewhere classified: Secondary | ICD-10-CM | POA: Diagnosis not present

## 2021-04-11 DIAGNOSIS — R2681 Unsteadiness on feet: Secondary | ICD-10-CM | POA: Diagnosis not present

## 2021-04-11 DIAGNOSIS — M25651 Stiffness of right hip, not elsewhere classified: Secondary | ICD-10-CM | POA: Diagnosis not present

## 2021-04-11 DIAGNOSIS — R1312 Dysphagia, oropharyngeal phase: Secondary | ICD-10-CM | POA: Diagnosis not present

## 2021-04-11 DIAGNOSIS — R278 Other lack of coordination: Secondary | ICD-10-CM | POA: Diagnosis not present

## 2021-04-11 DIAGNOSIS — M25652 Stiffness of left hip, not elsewhere classified: Secondary | ICD-10-CM | POA: Diagnosis not present

## 2021-04-11 DIAGNOSIS — R1310 Dysphagia, unspecified: Secondary | ICD-10-CM | POA: Diagnosis not present

## 2021-04-11 DIAGNOSIS — K219 Gastro-esophageal reflux disease without esophagitis: Secondary | ICD-10-CM | POA: Diagnosis not present

## 2021-04-15 DIAGNOSIS — R2681 Unsteadiness on feet: Secondary | ICD-10-CM | POA: Diagnosis not present

## 2021-04-15 DIAGNOSIS — M25661 Stiffness of right knee, not elsewhere classified: Secondary | ICD-10-CM | POA: Diagnosis not present

## 2021-04-15 DIAGNOSIS — M25651 Stiffness of right hip, not elsewhere classified: Secondary | ICD-10-CM | POA: Diagnosis not present

## 2021-04-15 DIAGNOSIS — M25662 Stiffness of left knee, not elsewhere classified: Secondary | ICD-10-CM | POA: Diagnosis not present

## 2021-04-15 DIAGNOSIS — M25652 Stiffness of left hip, not elsewhere classified: Secondary | ICD-10-CM | POA: Diagnosis not present

## 2021-04-15 DIAGNOSIS — R1312 Dysphagia, oropharyngeal phase: Secondary | ICD-10-CM | POA: Diagnosis not present

## 2021-04-15 DIAGNOSIS — R278 Other lack of coordination: Secondary | ICD-10-CM | POA: Diagnosis not present

## 2021-04-15 DIAGNOSIS — R1310 Dysphagia, unspecified: Secondary | ICD-10-CM | POA: Diagnosis not present

## 2021-04-15 DIAGNOSIS — K219 Gastro-esophageal reflux disease without esophagitis: Secondary | ICD-10-CM | POA: Diagnosis not present

## 2021-04-17 ENCOUNTER — Encounter (HOSPITAL_BASED_OUTPATIENT_CLINIC_OR_DEPARTMENT_OTHER): Payer: Medicare HMO | Attending: Internal Medicine | Admitting: Internal Medicine

## 2021-04-17 ENCOUNTER — Other Ambulatory Visit: Payer: Self-pay

## 2021-04-17 DIAGNOSIS — I872 Venous insufficiency (chronic) (peripheral): Secondary | ICD-10-CM | POA: Diagnosis not present

## 2021-04-17 DIAGNOSIS — R2681 Unsteadiness on feet: Secondary | ICD-10-CM | POA: Diagnosis not present

## 2021-04-17 DIAGNOSIS — I110 Hypertensive heart disease with heart failure: Secondary | ICD-10-CM | POA: Diagnosis not present

## 2021-04-17 DIAGNOSIS — R1310 Dysphagia, unspecified: Secondary | ICD-10-CM | POA: Diagnosis not present

## 2021-04-17 DIAGNOSIS — N400 Enlarged prostate without lower urinary tract symptoms: Secondary | ICD-10-CM | POA: Diagnosis not present

## 2021-04-17 DIAGNOSIS — M25652 Stiffness of left hip, not elsewhere classified: Secondary | ICD-10-CM | POA: Diagnosis not present

## 2021-04-17 DIAGNOSIS — I509 Heart failure, unspecified: Secondary | ICD-10-CM | POA: Insufficient documentation

## 2021-04-17 DIAGNOSIS — R278 Other lack of coordination: Secondary | ICD-10-CM | POA: Diagnosis not present

## 2021-04-17 DIAGNOSIS — G40909 Epilepsy, unspecified, not intractable, without status epilepticus: Secondary | ICD-10-CM | POA: Insufficient documentation

## 2021-04-17 DIAGNOSIS — I2510 Atherosclerotic heart disease of native coronary artery without angina pectoris: Secondary | ICD-10-CM | POA: Diagnosis not present

## 2021-04-17 DIAGNOSIS — I87331 Chronic venous hypertension (idiopathic) with ulcer and inflammation of right lower extremity: Secondary | ICD-10-CM | POA: Insufficient documentation

## 2021-04-17 DIAGNOSIS — L97212 Non-pressure chronic ulcer of right calf with fat layer exposed: Secondary | ICD-10-CM | POA: Diagnosis not present

## 2021-04-17 DIAGNOSIS — L97218 Non-pressure chronic ulcer of right calf with other specified severity: Secondary | ICD-10-CM | POA: Diagnosis not present

## 2021-04-17 DIAGNOSIS — M25661 Stiffness of right knee, not elsewhere classified: Secondary | ICD-10-CM | POA: Diagnosis not present

## 2021-04-17 DIAGNOSIS — M6281 Muscle weakness (generalized): Secondary | ICD-10-CM | POA: Diagnosis not present

## 2021-04-17 DIAGNOSIS — M25662 Stiffness of left knee, not elsewhere classified: Secondary | ICD-10-CM | POA: Diagnosis not present

## 2021-04-17 DIAGNOSIS — R1312 Dysphagia, oropharyngeal phase: Secondary | ICD-10-CM | POA: Diagnosis not present

## 2021-04-17 DIAGNOSIS — M25651 Stiffness of right hip, not elsewhere classified: Secondary | ICD-10-CM | POA: Diagnosis not present

## 2021-04-17 DIAGNOSIS — K219 Gastro-esophageal reflux disease without esophagitis: Secondary | ICD-10-CM | POA: Diagnosis not present

## 2021-04-18 DIAGNOSIS — M25662 Stiffness of left knee, not elsewhere classified: Secondary | ICD-10-CM | POA: Diagnosis not present

## 2021-04-18 DIAGNOSIS — K219 Gastro-esophageal reflux disease without esophagitis: Secondary | ICD-10-CM | POA: Diagnosis not present

## 2021-04-18 DIAGNOSIS — R278 Other lack of coordination: Secondary | ICD-10-CM | POA: Diagnosis not present

## 2021-04-18 DIAGNOSIS — M25652 Stiffness of left hip, not elsewhere classified: Secondary | ICD-10-CM | POA: Diagnosis not present

## 2021-04-18 DIAGNOSIS — M6281 Muscle weakness (generalized): Secondary | ICD-10-CM | POA: Diagnosis not present

## 2021-04-18 DIAGNOSIS — M25661 Stiffness of right knee, not elsewhere classified: Secondary | ICD-10-CM | POA: Diagnosis not present

## 2021-04-18 DIAGNOSIS — M25651 Stiffness of right hip, not elsewhere classified: Secondary | ICD-10-CM | POA: Diagnosis not present

## 2021-04-18 DIAGNOSIS — R1310 Dysphagia, unspecified: Secondary | ICD-10-CM | POA: Diagnosis not present

## 2021-04-18 DIAGNOSIS — R2681 Unsteadiness on feet: Secondary | ICD-10-CM | POA: Diagnosis not present

## 2021-04-18 DIAGNOSIS — R1312 Dysphagia, oropharyngeal phase: Secondary | ICD-10-CM | POA: Diagnosis not present

## 2021-04-18 NOTE — Progress Notes (Signed)
IGNACIO, LOWDER ([id number]) Visit Report for 04/17/2021 Arrival Information Details Patient Name: Date of Service: [name] [name], [name] [name] 04/17/2021 2:30 PM Medical Record Number: [id number] Patient Account Number: [id number] Date of Birth/Sex: Treating RN: May 10, 1925 (87 y.o. [name] Primary Care Sharlynn Seckinger: [name] Other Clinician: Referring Sri Clegg: Treating Jaedyn Lard/Extender: Carley Hammed in Treatment: 2 Visit Information History Since Last Visit Added or deleted any medications: No Patient Arrived: Wheel Chair Any new allergies or adverse reactions: No Arrival Time: 14:42 Had a fall or experienced change in No Accompanied By: self activities of daily living that may affect Transfer Assistance: Manual risk of falls: Patient Requires Transmission-Based Precautions: No Signs or symptoms of abuse/neglect since last visito No Patient Has Alerts: Yes Hospitalized since last visit: No Patient Alerts: DNR Implantable device outside of the clinic excluding No cellular tissue based products placed in the center since last visit: Has Dressing in Place as Prescribed: Yes Has Compression in Place as Prescribed: Yes Pain Present Now: No Electronic Signature(s) Signed: 04/17/2021 5:02:47 PM By: Dellie Catholic RN Entered By: Dellie Catholic on 04/17/2021 14:44:22 -------------------------------------------------------------------------------- Compression Therapy Details Patient Name: Date of Service: [name]. 04/17/2021 2:30 PM Medical Record Number: [id number] Patient Account Number: [id number] Date of Birth/Sex: Treating RN: 07-Jul-1924 (87 y.o. [name] Primary Care Anala Whisenant: [name] Other Clinician: Referring Alicea Wente: Treating Corinda Ammon/Extender: Carley Hammed in Treatment: 2 Compression Therapy Performed for Wound Assessment: Wound #1 Right,Posterior Lower Leg Performed By: Clinician  Levan Hurst, RN Compression Type: Three Layer Post Procedure Diagnosis Same as Pre-procedure Electronic Signature(s) Signed: 04/18/2021 5:51:35 PM By: Levan Hurst RN, BSN Entered By: Levan Hurst on 04/17/2021 15:26:23 -------------------------------------------------------------------------------- Encounter Discharge Information Details Patient Name: Date of Service: [name] 04/17/2021 2:30 PM Medical Record Number: [id number] Patient Account Number: [id number] Date of Birth/Sex: Treating RN: 04-11-1925 (87 y.o. [name] Primary Care Shermaine Brigham: [name] Other Clinician: Referring Preciliano Castell: Treating Carry Weesner/Extender: Carley Hammed in Treatment: 2 Encounter Discharge Information Items Post Procedure Vitals Discharge Condition: Stable Temperature (F): 97.8 Ambulatory Status: Wheelchair Pulse (bpm): 56 Discharge Destination: Home Respiratory Rate (breaths/min): 18 Transportation: Private Auto Blood Pressure (mmHg): 145/69 Accompanied By: alone Schedule Follow-up Appointment: Yes Clinical Summary of Care: Patient Declined Electronic Signature(s) Signed: 04/18/2021 5:51:35 PM By: Levan Hurst RN, BSN Entered By: Levan Hurst on 04/17/2021 16:04:47 -------------------------------------------------------------------------------- Lower Extremity Assessment Details Patient Name: Date of Service: [name] 04/17/2021 2:30 PM Medical Record Number: [id number] Patient Account Number: [id number] Date of Birth/Sex: Treating RN: February 04, 1925 (87 y.o. [name] Primary Care Riannon Mukherjee: [name] Other Clinician: Referring Hutchinson Isenberg: Treating Kaela Beitz/Extender: Carley Hammed in Treatment: 2 Edema Assessment Assessed: Shirlyn Goltz: No] Patrice Paradise: No] E[Left: dema] [Right: :] Calf Left: Right: Point of Measurement: From Medial Instep 37.3 cm Ankle Left: Right: Point of Measurement: From  Medial Instep 22.3 cm Vascular Assessment Pulses: Dorsalis Pedis Palpable: [Right:Yes] Posterior Tibial Palpable: [Right:Yes] Electronic Signature(s) Signed: 04/17/2021 5:02:47 PM By: Dellie Catholic RN Signed: 04/18/2021 5:51:35 PM By: Levan Hurst RN, BSN Entered By: Dellie Catholic on 04/17/2021 15:09:07 -------------------------------------------------------------------------------- Multi Wound Chart Details Patient Name: Date of Service: [name]. 04/17/2021 2:30 PM Medical Record Number: [id number] Patient Account Number: [id number] Date of Birth/Sex: Treating RN: 08-23-1924 (87 y.o. [name] Primary Care Monai Hindes: [name] Other Clinician: Referring Carma Dwiggins: Treating Javione Gunawan/Extender: Carley Hammed in Treatment: 2 Vital Signs Height(in): 68 Pulse(bpm): 56 Weight(lbs): 170 Blood  Pressure(mmHg): 145/69 Body Mass Index(BMI): 26 Temperature(F): 97.8 Respiratory Rate(breaths/min): 18 Photos: [N/A:N/A] Right, Posterior Lower Leg N/A N/A Wound Location: Gradually Appeared N/A N/A Wounding Event: Venous Leg Ulcer N/A N/A Primary Etiology: Arrhythmia, Congestive Heart Failure, N/A N/A Comorbid History: Coronary Artery Disease, Hypertension, Peripheral Venous Disease, Osteoarthritis, Seizure Disorder 03/19/2021 N/A N/A Date Acquired: 2 N/A N/A Weeks of Treatment: Open N/A N/A Wound Status: 7x4.6x0.2 N/A N/A Measurements L x W x D (cm) 25.29 N/A N/A A (cm) : rea 5.058 N/A N/A Volume (cm) : 2.10% N/A N/A % Reduction in A rea: -95.70% N/A N/A % Reduction in Volume: Full Thickness Without Exposed N/A N/A Classification: Support Structures Medium N/A N/A Exudate A mount: Serous N/A N/A Exudate Type: amber N/A N/A Exudate Color: Flat and Intact N/A N/A Wound Margin: Small (1-33%) N/A N/A Granulation A mount: Pink N/A N/A Granulation Quality: Large (67-100%) N/A N/A Necrotic A mount: Fat Layer  (Subcutaneous Tissue): Yes N/A N/A Exposed Structures: Fascia: No Tendon: No Muscle: No Joint: No Bone: No Small (1-33%) N/A N/A Epithelialization: Debridement - Excisional N/A N/A Debridement: Pre-procedure Verification/Time Out 15:23 N/A N/A Taken: Subcutaneous, Slough N/A N/A Tissue Debrided: Skin/Subcutaneous Tissue N/A N/A Level: 32.2 N/A N/A Debridement A (sq cm): rea Curette N/A N/A Instrument: Minimum N/A N/A Bleeding: Pressure N/A N/A Hemostasis A chieved: 3 N/A N/A Procedural Pain: 2 N/A N/A Post Procedural Pain: Procedure was tolerated well N/A N/A Debridement Treatment Response: 7x4.6x0.2 N/A N/A Post Debridement Measurements L x W x D (cm) 5.058 N/A N/A Post Debridement Volume: (cm) Compression Therapy N/A N/A Procedures Performed: Debridement Treatment Notes Electronic Signature(s) Signed: 04/18/2021 5:02:06 PM By: Linton Ham MD Signed: 04/18/2021 5:51:35 PM By: Levan Hurst RN, BSN Entered By: Linton Ham on 04/17/2021 15:31:44 -------------------------------------------------------------------------------- Multi-Disciplinary Care Plan Details Patient Name: Date of Service: [name] 04/17/2021 2:30 PM Medical Record Number: [id number] Patient Account Number: [id number] Date of Birth/Sex: Treating RN: [birth year] (87 y.o. [name] Primary Care Bryna Razavi: [name] Other Clinician: Referring Ebonee Stober: Treating Cylah Fannin/Extender: Carley Hammed in Treatment: 2 Multidisciplinary Care Plan reviewed with physician Active Inactive Venous Leg Ulcer Nursing Diagnoses: Knowledge deficit related to disease process and management Potential for venous Insuffiency (use before diagnosis confirmed) Goals: Patient will maintain optimal edema control Date Initiated: 04/02/2021 Target Resolution Date: 04/30/2021 Goal Status: Active Interventions: Assess peripheral edema status every  visit. Compression as ordered Provide education on venous insufficiency Treatment Activities: Therapeutic compression applied : 04/02/2021 Notes: Wound/Skin Impairment Nursing Diagnoses: Impaired tissue integrity Knowledge deficit related to ulceration/compromised skin integrity Goals: Patient/caregiver will verbalize understanding of skin care regimen Date Initiated: 04/02/2021 Target Resolution Date: 04/30/2021 Goal Status: Active Ulcer/skin breakdown will have a volume reduction of 30% by week 4 Date Initiated: 04/02/2021 Target Resolution Date: 04/30/2021 Goal Status: Active Interventions: Assess patient/caregiver ability to obtain necessary supplies Assess patient/caregiver ability to perform ulcer/skin care regimen upon admission and as needed Assess ulceration(s) every visit Provide education on ulcer and skin care Treatment Activities: Skin care regimen initiated : 04/02/2021 Topical wound management initiated : 04/02/2021 Notes: Electronic Signature(s) Signed: 04/18/2021 5:51:35 PM By: Levan Hurst RN, BSN Entered By: Levan Hurst on 04/17/2021 16:00:24 -------------------------------------------------------------------------------- Pain Assessment Details Patient Name: Date of Service: [name]. 04/17/2021 2:30 PM Medical Record Number: [id number] Patient Account Number: [id number] Date of Birth/Sex: Treating RN: 11/23/1924 (87 y.o. [name] Primary Care Axton Cihlar: [name] Other Clinician: Referring Tajay Muzzy: Treating Mckenize Mezera/Extender: Carley Hammed in Treatment:  2 Active Problems Location of Pain Severity and Description of Pain Patient Has Paino No Site Locations Rate the pain. Current Pain Level: 0 Pain Management and Medication Current Pain Management: Medication: No Cold Application: No Rest: No Massage: No Activity: No T.E.N.S.: No Heat Application: No Leg drop or elevation: No Is the  Current Pain Management Adequate: Adequate How does your wound impact your activities of daily livingo Sleep: No Bathing: No Appetite: No Relationship With Others: No Bladder Continence: No Emotions: No Bowel Continence: No Work: No Toileting: No Drive: No Dressing: No Hobbies: No Electronic Signature(s) Signed: 04/17/2021 5:02:47 PM By: Dellie Catholic RN Signed: 04/18/2021 5:51:35 PM By: Levan Hurst RN, BSN Entered By: Dellie Catholic on 04/17/2021 14:45:48 -------------------------------------------------------------------------------- Patient/Caregiver Education Details Patient Name: Date of Service: [name] 11/2/2022andnbsp2:30 PM Medical Record Number: [id number] Patient Account Number: [id number] Date of Birth/Gender: Treating RN: July 26, 1924 (87 y.o. [name] Primary Care Physician: [name] Other Clinician: Referring Physician: Treating Physician/Extender: Carley Hammed in Treatment: 2 Education Assessment Education Provided To: Patient Education Topics Provided Wound/Skin Impairment: Methods: Explain/Verbal Responses: State content correctly Electronic Signature(s) Signed: 04/18/2021 5:51:35 PM By: Levan Hurst RN, BSN Entered By: Levan Hurst on 04/17/2021 16:01:29 -------------------------------------------------------------------------------- Wound Assessment Details Patient Name: Date of Service: [name] 04/17/2021 2:30 PM Medical Record Number: [id number] Patient Account Number: [id number] Date of Birth/Sex: Treating RN: Jun 02, 1925 (87 y.o. [name] Primary Care Farhan Jean: [name] Other Clinician: Referring Olayinka Gathers: Treating Kaylene Dawn/Extender: Carley Hammed in Treatment: 2 Wound Status Wound Number: 1 Primary Venous Leg Ulcer Etiology: Wound Location: Right, Posterior Lower Leg Wound Open Wounding Event: Gradually Appeared Status: Date  Acquired: 03/19/2021 Comorbid Arrhythmia, Congestive Heart Failure, Coronary Artery Disease, Weeks Of Treatment: 2 History: Hypertension, Peripheral Venous Disease, Osteoarthritis, Seizure Clustered Wound: No Disorder Photos Wound Measurements Length: (cm) 7 Width: (cm) 4.6 Depth: (cm) 0.2 Area: (cm) 25.29 Volume: (cm) 5.058 % Reduction in Area: 2.1% % Reduction in Volume: -95.7% Epithelialization: Small (1-33%) Tunneling: No Undermining: No Wound Description Classification: Full Thickness Without Exposed Support Structures Wound Margin: Flat and Intact Exudate Amount: Medium Exudate Type: Serous Exudate Color: amber Foul Odor After Cleansing: No Slough/Fibrino Yes Wound Bed Granulation Amount: Small (1-33%) Exposed Structure Granulation Quality: Pink Fascia Exposed: No Necrotic Amount: Large (67-100%) Fat Layer (Subcutaneous Tissue) Exposed: Yes Necrotic Quality: Adherent Slough Tendon Exposed: No Muscle Exposed: No Joint Exposed: No Bone Exposed: No Treatment Notes Wound #1 (Lower Leg) Wound Laterality: Right, Posterior Cleanser Soap and Water Discharge Instruction: May shower and wash wound with dial antibacterial soap and water prior to dressing change. Wound Cleanser Discharge Instruction: Cleanse the wound with wound cleanser prior to applying a clean dressing using gauze sponges, not tissue or cotton balls. Peri-Wound Care Sween Lotion (Moisturizing lotion) Discharge Instruction: Apply moisturizing lotion to lower leg with dressing changes Topical Primary Dressing IODOFLEX 0.9% Cadexomer Iodine Pad 4x6 cm Discharge Instruction: or iodosorb gel Apply to wound bed as instructed Secondary Dressing Woven Gauze Sponge, Non-Sterile 4x4 in Discharge Instruction: Apply over primary dressing as directed. ABD Pad, 8x10 Discharge Instruction: Apply over primary dressing as directed. Secured With Compression Wrap ThreePress (3 layer compression wrap) Discharge  Instruction: Apply three layer compression as directed. Compression Stockings Add-Ons Electronic Signature(s) Signed: 04/17/2021 5:02:47 PM By: Dellie Catholic RN Signed: 04/18/2021 5:51:35 PM By: Levan Hurst RN, BSN Entered By: Dellie Catholic on 04/17/2021 15:09:56 -------------------------------------------------------------------------------- [place] Details Patient Name: Date of Service: [name].  04/17/2021 2:30 PM Medical Record Number: [id number] Patient Account Number: [id number] Date of Birth/Sex: Treating RN: 1925/03/25 (87 y.o. [name] Primary Care Mannie Wineland: [name] Other Clinician: Referring Zamirah Denny: Treating Sandie Swayze/Extender: Carley Hammed in Treatment: 2 Vital Signs Time Taken: 14:45 Temperature (F): 97.8 Height (in): 68 Pulse (bpm): 56 Weight (lbs): 170 Respiratory Rate (breaths/min): 18 Body Mass Index (BMI): 25.8 Blood Pressure (mmHg): 145/69 Reference Range: 80 - 120 mg / dl Electronic Signature(s) Signed: 04/17/2021 5:02:47 PM By: Dellie Catholic RN Entered By: Dellie Catholic on 04/17/2021 14:45:08

## 2021-04-19 DIAGNOSIS — M25652 Stiffness of left hip, not elsewhere classified: Secondary | ICD-10-CM | POA: Diagnosis not present

## 2021-04-19 DIAGNOSIS — M6281 Muscle weakness (generalized): Secondary | ICD-10-CM | POA: Diagnosis not present

## 2021-04-19 DIAGNOSIS — R1310 Dysphagia, unspecified: Secondary | ICD-10-CM | POA: Diagnosis not present

## 2021-04-19 DIAGNOSIS — R278 Other lack of coordination: Secondary | ICD-10-CM | POA: Diagnosis not present

## 2021-04-19 DIAGNOSIS — R2681 Unsteadiness on feet: Secondary | ICD-10-CM | POA: Diagnosis not present

## 2021-04-19 DIAGNOSIS — M25661 Stiffness of right knee, not elsewhere classified: Secondary | ICD-10-CM | POA: Diagnosis not present

## 2021-04-19 DIAGNOSIS — K219 Gastro-esophageal reflux disease without esophagitis: Secondary | ICD-10-CM | POA: Diagnosis not present

## 2021-04-19 DIAGNOSIS — R1312 Dysphagia, oropharyngeal phase: Secondary | ICD-10-CM | POA: Diagnosis not present

## 2021-04-19 DIAGNOSIS — M25662 Stiffness of left knee, not elsewhere classified: Secondary | ICD-10-CM | POA: Diagnosis not present

## 2021-04-19 DIAGNOSIS — M25651 Stiffness of right hip, not elsewhere classified: Secondary | ICD-10-CM | POA: Diagnosis not present

## 2021-04-20 DIAGNOSIS — R278 Other lack of coordination: Secondary | ICD-10-CM | POA: Diagnosis not present

## 2021-04-20 DIAGNOSIS — K219 Gastro-esophageal reflux disease without esophagitis: Secondary | ICD-10-CM | POA: Diagnosis not present

## 2021-04-20 DIAGNOSIS — M25662 Stiffness of left knee, not elsewhere classified: Secondary | ICD-10-CM | POA: Diagnosis not present

## 2021-04-20 DIAGNOSIS — R1310 Dysphagia, unspecified: Secondary | ICD-10-CM | POA: Diagnosis not present

## 2021-04-20 DIAGNOSIS — M25651 Stiffness of right hip, not elsewhere classified: Secondary | ICD-10-CM | POA: Diagnosis not present

## 2021-04-20 DIAGNOSIS — R1312 Dysphagia, oropharyngeal phase: Secondary | ICD-10-CM | POA: Diagnosis not present

## 2021-04-20 DIAGNOSIS — M25652 Stiffness of left hip, not elsewhere classified: Secondary | ICD-10-CM | POA: Diagnosis not present

## 2021-04-20 DIAGNOSIS — R2681 Unsteadiness on feet: Secondary | ICD-10-CM | POA: Diagnosis not present

## 2021-04-20 DIAGNOSIS — M25661 Stiffness of right knee, not elsewhere classified: Secondary | ICD-10-CM | POA: Diagnosis not present

## 2021-04-20 DIAGNOSIS — M6281 Muscle weakness (generalized): Secondary | ICD-10-CM | POA: Diagnosis not present

## 2021-04-22 DIAGNOSIS — M6281 Muscle weakness (generalized): Secondary | ICD-10-CM | POA: Diagnosis not present

## 2021-04-22 DIAGNOSIS — M25651 Stiffness of right hip, not elsewhere classified: Secondary | ICD-10-CM | POA: Diagnosis not present

## 2021-04-22 DIAGNOSIS — K219 Gastro-esophageal reflux disease without esophagitis: Secondary | ICD-10-CM | POA: Diagnosis not present

## 2021-04-22 DIAGNOSIS — M25662 Stiffness of left knee, not elsewhere classified: Secondary | ICD-10-CM | POA: Diagnosis not present

## 2021-04-22 DIAGNOSIS — R2681 Unsteadiness on feet: Secondary | ICD-10-CM | POA: Diagnosis not present

## 2021-04-22 DIAGNOSIS — R278 Other lack of coordination: Secondary | ICD-10-CM | POA: Diagnosis not present

## 2021-04-22 DIAGNOSIS — R1310 Dysphagia, unspecified: Secondary | ICD-10-CM | POA: Diagnosis not present

## 2021-04-22 DIAGNOSIS — M25652 Stiffness of left hip, not elsewhere classified: Secondary | ICD-10-CM | POA: Diagnosis not present

## 2021-04-22 DIAGNOSIS — M25661 Stiffness of right knee, not elsewhere classified: Secondary | ICD-10-CM | POA: Diagnosis not present

## 2021-04-22 DIAGNOSIS — R1312 Dysphagia, oropharyngeal phase: Secondary | ICD-10-CM | POA: Diagnosis not present

## 2021-04-23 DIAGNOSIS — M25651 Stiffness of right hip, not elsewhere classified: Secondary | ICD-10-CM | POA: Diagnosis not present

## 2021-04-23 DIAGNOSIS — R2681 Unsteadiness on feet: Secondary | ICD-10-CM | POA: Diagnosis not present

## 2021-04-23 DIAGNOSIS — R1312 Dysphagia, oropharyngeal phase: Secondary | ICD-10-CM | POA: Diagnosis not present

## 2021-04-23 DIAGNOSIS — M25661 Stiffness of right knee, not elsewhere classified: Secondary | ICD-10-CM | POA: Diagnosis not present

## 2021-04-23 DIAGNOSIS — M25652 Stiffness of left hip, not elsewhere classified: Secondary | ICD-10-CM | POA: Diagnosis not present

## 2021-04-23 DIAGNOSIS — K219 Gastro-esophageal reflux disease without esophagitis: Secondary | ICD-10-CM | POA: Diagnosis not present

## 2021-04-23 DIAGNOSIS — R1310 Dysphagia, unspecified: Secondary | ICD-10-CM | POA: Diagnosis not present

## 2021-04-23 DIAGNOSIS — M25662 Stiffness of left knee, not elsewhere classified: Secondary | ICD-10-CM | POA: Diagnosis not present

## 2021-04-23 DIAGNOSIS — R278 Other lack of coordination: Secondary | ICD-10-CM | POA: Diagnosis not present

## 2021-04-23 DIAGNOSIS — M6281 Muscle weakness (generalized): Secondary | ICD-10-CM | POA: Diagnosis not present

## 2021-04-24 DIAGNOSIS — R1310 Dysphagia, unspecified: Secondary | ICD-10-CM | POA: Diagnosis not present

## 2021-04-24 DIAGNOSIS — M25661 Stiffness of right knee, not elsewhere classified: Secondary | ICD-10-CM | POA: Diagnosis not present

## 2021-04-24 DIAGNOSIS — M6281 Muscle weakness (generalized): Secondary | ICD-10-CM | POA: Diagnosis not present

## 2021-04-24 DIAGNOSIS — M25651 Stiffness of right hip, not elsewhere classified: Secondary | ICD-10-CM | POA: Diagnosis not present

## 2021-04-24 DIAGNOSIS — R1312 Dysphagia, oropharyngeal phase: Secondary | ICD-10-CM | POA: Diagnosis not present

## 2021-04-24 DIAGNOSIS — R2681 Unsteadiness on feet: Secondary | ICD-10-CM | POA: Diagnosis not present

## 2021-04-24 DIAGNOSIS — M25652 Stiffness of left hip, not elsewhere classified: Secondary | ICD-10-CM | POA: Diagnosis not present

## 2021-04-24 DIAGNOSIS — M25662 Stiffness of left knee, not elsewhere classified: Secondary | ICD-10-CM | POA: Diagnosis not present

## 2021-04-24 DIAGNOSIS — K219 Gastro-esophageal reflux disease without esophagitis: Secondary | ICD-10-CM | POA: Diagnosis not present

## 2021-04-24 DIAGNOSIS — R278 Other lack of coordination: Secondary | ICD-10-CM | POA: Diagnosis not present

## 2021-04-25 DIAGNOSIS — M6281 Muscle weakness (generalized): Secondary | ICD-10-CM | POA: Diagnosis not present

## 2021-04-25 DIAGNOSIS — M25662 Stiffness of left knee, not elsewhere classified: Secondary | ICD-10-CM | POA: Diagnosis not present

## 2021-04-25 DIAGNOSIS — N138 Other obstructive and reflux uropathy: Secondary | ICD-10-CM | POA: Diagnosis not present

## 2021-04-25 DIAGNOSIS — M25661 Stiffness of right knee, not elsewhere classified: Secondary | ICD-10-CM | POA: Diagnosis not present

## 2021-04-25 DIAGNOSIS — M25651 Stiffness of right hip, not elsewhere classified: Secondary | ICD-10-CM | POA: Diagnosis not present

## 2021-04-25 DIAGNOSIS — K219 Gastro-esophageal reflux disease without esophagitis: Secondary | ICD-10-CM | POA: Diagnosis not present

## 2021-04-25 DIAGNOSIS — M25652 Stiffness of left hip, not elsewhere classified: Secondary | ICD-10-CM | POA: Diagnosis not present

## 2021-04-25 DIAGNOSIS — R1310 Dysphagia, unspecified: Secondary | ICD-10-CM | POA: Diagnosis not present

## 2021-04-25 DIAGNOSIS — R1312 Dysphagia, oropharyngeal phase: Secondary | ICD-10-CM | POA: Diagnosis not present

## 2021-04-25 DIAGNOSIS — N401 Enlarged prostate with lower urinary tract symptoms: Secondary | ICD-10-CM | POA: Diagnosis not present

## 2021-04-25 DIAGNOSIS — N3020 Other chronic cystitis without hematuria: Secondary | ICD-10-CM | POA: Diagnosis not present

## 2021-04-25 DIAGNOSIS — R2681 Unsteadiness on feet: Secondary | ICD-10-CM | POA: Diagnosis not present

## 2021-04-25 DIAGNOSIS — R278 Other lack of coordination: Secondary | ICD-10-CM | POA: Diagnosis not present

## 2021-04-29 DIAGNOSIS — M25661 Stiffness of right knee, not elsewhere classified: Secondary | ICD-10-CM | POA: Diagnosis not present

## 2021-04-29 DIAGNOSIS — M25652 Stiffness of left hip, not elsewhere classified: Secondary | ICD-10-CM | POA: Diagnosis not present

## 2021-04-29 DIAGNOSIS — R1310 Dysphagia, unspecified: Secondary | ICD-10-CM | POA: Diagnosis not present

## 2021-04-29 DIAGNOSIS — K219 Gastro-esophageal reflux disease without esophagitis: Secondary | ICD-10-CM | POA: Diagnosis not present

## 2021-04-29 DIAGNOSIS — M25662 Stiffness of left knee, not elsewhere classified: Secondary | ICD-10-CM | POA: Diagnosis not present

## 2021-04-29 DIAGNOSIS — R1312 Dysphagia, oropharyngeal phase: Secondary | ICD-10-CM | POA: Diagnosis not present

## 2021-04-29 DIAGNOSIS — R278 Other lack of coordination: Secondary | ICD-10-CM | POA: Diagnosis not present

## 2021-04-29 DIAGNOSIS — R2681 Unsteadiness on feet: Secondary | ICD-10-CM | POA: Diagnosis not present

## 2021-04-29 DIAGNOSIS — M25651 Stiffness of right hip, not elsewhere classified: Secondary | ICD-10-CM | POA: Diagnosis not present

## 2021-04-29 DIAGNOSIS — M6281 Muscle weakness (generalized): Secondary | ICD-10-CM | POA: Diagnosis not present

## 2021-04-30 DIAGNOSIS — R2681 Unsteadiness on feet: Secondary | ICD-10-CM | POA: Diagnosis not present

## 2021-04-30 DIAGNOSIS — R1310 Dysphagia, unspecified: Secondary | ICD-10-CM | POA: Diagnosis not present

## 2021-04-30 DIAGNOSIS — M6281 Muscle weakness (generalized): Secondary | ICD-10-CM | POA: Diagnosis not present

## 2021-04-30 DIAGNOSIS — R1312 Dysphagia, oropharyngeal phase: Secondary | ICD-10-CM | POA: Diagnosis not present

## 2021-04-30 DIAGNOSIS — M25661 Stiffness of right knee, not elsewhere classified: Secondary | ICD-10-CM | POA: Diagnosis not present

## 2021-04-30 DIAGNOSIS — K219 Gastro-esophageal reflux disease without esophagitis: Secondary | ICD-10-CM | POA: Diagnosis not present

## 2021-04-30 DIAGNOSIS — R278 Other lack of coordination: Secondary | ICD-10-CM | POA: Diagnosis not present

## 2021-04-30 DIAGNOSIS — M25651 Stiffness of right hip, not elsewhere classified: Secondary | ICD-10-CM | POA: Diagnosis not present

## 2021-04-30 DIAGNOSIS — M25652 Stiffness of left hip, not elsewhere classified: Secondary | ICD-10-CM | POA: Diagnosis not present

## 2021-04-30 DIAGNOSIS — M25662 Stiffness of left knee, not elsewhere classified: Secondary | ICD-10-CM | POA: Diagnosis not present

## 2021-05-01 ENCOUNTER — Other Ambulatory Visit: Payer: Self-pay

## 2021-05-01 ENCOUNTER — Encounter (HOSPITAL_BASED_OUTPATIENT_CLINIC_OR_DEPARTMENT_OTHER): Payer: Medicare HMO | Admitting: Internal Medicine

## 2021-05-01 DIAGNOSIS — L97212 Non-pressure chronic ulcer of right calf with fat layer exposed: Secondary | ICD-10-CM | POA: Diagnosis not present

## 2021-05-01 DIAGNOSIS — N400 Enlarged prostate without lower urinary tract symptoms: Secondary | ICD-10-CM | POA: Diagnosis not present

## 2021-05-01 DIAGNOSIS — G40909 Epilepsy, unspecified, not intractable, without status epilepticus: Secondary | ICD-10-CM | POA: Diagnosis not present

## 2021-05-01 DIAGNOSIS — I87331 Chronic venous hypertension (idiopathic) with ulcer and inflammation of right lower extremity: Secondary | ICD-10-CM | POA: Diagnosis not present

## 2021-05-01 DIAGNOSIS — I509 Heart failure, unspecified: Secondary | ICD-10-CM | POA: Diagnosis not present

## 2021-05-01 DIAGNOSIS — I2510 Atherosclerotic heart disease of native coronary artery without angina pectoris: Secondary | ICD-10-CM | POA: Diagnosis not present

## 2021-05-01 DIAGNOSIS — I110 Hypertensive heart disease with heart failure: Secondary | ICD-10-CM | POA: Diagnosis not present

## 2021-05-01 DIAGNOSIS — L97218 Non-pressure chronic ulcer of right calf with other specified severity: Secondary | ICD-10-CM | POA: Diagnosis not present

## 2021-05-02 DIAGNOSIS — K219 Gastro-esophageal reflux disease without esophagitis: Secondary | ICD-10-CM | POA: Diagnosis not present

## 2021-05-02 DIAGNOSIS — R1312 Dysphagia, oropharyngeal phase: Secondary | ICD-10-CM | POA: Diagnosis not present

## 2021-05-02 DIAGNOSIS — M6281 Muscle weakness (generalized): Secondary | ICD-10-CM | POA: Diagnosis not present

## 2021-05-02 DIAGNOSIS — M25662 Stiffness of left knee, not elsewhere classified: Secondary | ICD-10-CM | POA: Diagnosis not present

## 2021-05-02 DIAGNOSIS — M25651 Stiffness of right hip, not elsewhere classified: Secondary | ICD-10-CM | POA: Diagnosis not present

## 2021-05-02 DIAGNOSIS — R2681 Unsteadiness on feet: Secondary | ICD-10-CM | POA: Diagnosis not present

## 2021-05-02 DIAGNOSIS — M25661 Stiffness of right knee, not elsewhere classified: Secondary | ICD-10-CM | POA: Diagnosis not present

## 2021-05-02 DIAGNOSIS — M25652 Stiffness of left hip, not elsewhere classified: Secondary | ICD-10-CM | POA: Diagnosis not present

## 2021-05-02 DIAGNOSIS — R1310 Dysphagia, unspecified: Secondary | ICD-10-CM | POA: Diagnosis not present

## 2021-05-02 DIAGNOSIS — R278 Other lack of coordination: Secondary | ICD-10-CM | POA: Diagnosis not present

## 2021-05-02 NOTE — Progress Notes (Signed)
[name], [name] ([id number]) Visit Report for 05/01/2021 Arrival Information Details Patient Name: Date of Service: [name] [name], [name] [name] 05/01/2021 2:30 PM Medical Record Number: [id number] Patient Account Number: [id number] Date of Birth/Sex: Treating RN: 1924/09/30 (87 y.o. [name], [name] Primary Care Rashied Corallo: [name] Other Clinician: Referring Piper Hassebrock: Treating Krystian Younglove/Extender: Carley Hammed in Treatment: 4 Visit Information History Since Last Visit Added or deleted any medications: No Patient Arrived: Wheel Chair Any new allergies or adverse reactions: No Arrival Time: 14:21 Had a fall or experienced change in No Accompanied By: self activities of daily living that may affect Transfer Assistance: Manual risk of falls: Patient Identification Verified: Yes Signs or symptoms of abuse/neglect since last visito No Secondary Verification Process Completed: Yes Hospitalized since last visit: No Patient Requires Transmission-Based Precautions: No Implantable device outside of the clinic excluding No Patient Has Alerts: Yes cellular tissue based products placed in the center Patient Alerts: DNR since last visit: Has Dressing in Place as Prescribed: Yes Pain Present Now: No Electronic Signature(s) Signed: 05/02/2021 5:03:29 PM By: Rhae Hammock RN Entered By: Rhae Hammock on 05/01/2021 14:21:38 -------------------------------------------------------------------------------- Compression Therapy Details Patient Name: Date of Service: [name] [name]. 05/01/2021 2:30 PM Medical Record Number: [id number] Patient Account Number: [id number] Date of Birth/Sex: Treating RN: 16-Aug-1924 (87 y.o. [name] Primary Care Trevyn Lumpkin: [name] Other Clinician: Referring Suzzette Gasparro: Treating Roquel Burgin/Extender: Carley Hammed in Treatment: 4 Compression Therapy Performed for Wound Assessment: Wound #1  Right,Posterior Lower Leg Performed By: Clinician Levan Hurst, RN Compression Type: Three Layer Post Procedure Diagnosis Same as Pre-procedure Electronic Signature(s) Signed: 05/02/2021 5:23:58 PM By: Levan Hurst RN, BSN Entered By: Levan Hurst on 05/01/2021 15:01:04 -------------------------------------------------------------------------------- Encounter Discharge Information Details Patient Name: Date of Service: [name] [name] 05/01/2021 2:30 PM Medical Record Number: [id number] Patient Account Number: [id number] Date of Birth/Sex: Treating RN: 04-11-1925 (87 y.o. [name] Primary Care Myrtha Tonkovich: [name] Other Clinician: Referring Tyshon Fanning: Treating Deverick Pruss/Extender: Carley Hammed in Treatment: 4 Encounter Discharge Information Items Discharge Condition: Stable Ambulatory Status: Wheelchair Discharge Destination: Home Transportation: Private Auto Accompanied By: alone Schedule Follow-up Appointment: Yes Clinical Summary of Care: Patient Declined Electronic Signature(s) Signed: 05/02/2021 5:23:58 PM By: Levan Hurst RN, BSN Entered By: Levan Hurst on 05/01/2021 17:29:51 -------------------------------------------------------------------------------- Lower Extremity Assessment Details Patient Name: Date of Service: [name] [name] 05/01/2021 2:30 PM Medical Record Number: [id number] Patient Account Number: [id number] Date of Birth/Sex: Treating RN: 02/28/1925 (87 y.o. [name], [name] Primary Care Bates Collington: [name] Other Clinician: Referring Rosmary Dionisio: Treating Kataryna Mcquilkin/Extender: Carley Hammed in Treatment: 4 Edema Assessment Assessed: Shirlyn Goltz: No] Patrice Paradise: Yes] Edema: [Left: Ye] [Right: s] Calf Left: Right: Point of Measurement: From Medial Instep 37.3 cm Ankle Left: Right: Point of Measurement: From Medial Instep 22.3 cm Vascular Assessment Pulses: Dorsalis  Pedis Palpable: [Right:Yes] Posterior Tibial Palpable: [Right:Yes] Electronic Signature(s) Signed: 05/02/2021 5:03:29 PM By: Rhae Hammock RN Entered By: Rhae Hammock on 05/01/2021 14:22:12 -------------------------------------------------------------------------------- Multi Wound Chart Details Patient Name: Date of Service: [name] [name]. 05/01/2021 2:30 PM Medical Record Number: [id number] Patient Account Number: [id number] Date of Birth/Sex: Treating RN: August 27, 1924 (87 y.o. [name] Primary Care Jordan Caraveo: [name] Other Clinician: Referring Yitzel Shasteen: Treating Shayleigh Bouldin/Extender: Carley Hammed in Treatment: 4 Vital Signs Height(in): 68 Pulse(bpm): 44 Weight(lbs): 170 Blood Pressure(mmHg): 191/66 Body Mass Index(BMI): 26 Temperature(F): 97.9 Respiratory Rate(breaths/min): 18 Photos: [N/A:N/A] Right, Posterior Lower Leg N/A N/A Wound Location: Gradually Appeared  N/A N/A Wounding Event: Venous Leg Ulcer N/A N/A Primary Etiology: Arrhythmia, Congestive Heart Failure, N/A N/A Comorbid History: Coronary Artery Disease, Hypertension, Peripheral Venous Disease, Osteoarthritis, Seizure Disorder 03/19/2021 N/A N/A Date Acquired: 4 N/A N/A Weeks of Treatment: Open N/A N/A Wound Status: 5x3.5x0.1 N/A N/A Measurements L x W x D (cm) 13.744 N/A N/A A (cm) : rea 1.374 N/A N/A Volume (cm) : 46.80% N/A N/A % Reduction in Area: 46.80% N/A N/A % Reduction in Volume: Full Thickness Without Exposed N/A N/A Classification: Support Structures Medium N/A N/A Exudate Amount: Serous N/A N/A Exudate Type: amber N/A N/A Exudate Color: Flat and Intact N/A N/A Wound Margin: Large (67-100%) N/A N/A Granulation Amount: Pink N/A N/A Granulation Quality: Small (1-33%) N/A N/A Necrotic Amount: Fat Layer (Subcutaneous Tissue): Yes N/A N/A Exposed Structures: Fascia: No Tendon: No Muscle: No Joint: No Bone:  No Small (1-33%) N/A N/A Epithelialization: Compression Therapy N/A N/A Procedures Performed: Treatment Notes Electronic Signature(s) Signed: 05/02/2021 1:42:03 PM By: Linton Ham MD Signed: 05/02/2021 5:23:58 PM By: Levan Hurst RN, BSN Entered By: Linton Ham on 05/01/2021 15:05:50 -------------------------------------------------------------------------------- Multi-Disciplinary Care Plan Details Patient Name: Date of Service: [name] [name] 05/01/2021 2:30 PM Medical Record Number: [id number] Patient Account Number: [id number] Date of Birth/Sex: Treating RN: December 30, 1924 (87 y.o. [name], [name] Primary Care Bristal Steffy: [name] Other Clinician: Referring Deaven Barron: Treating Leighann Amadon/Extender: Carley Hammed in Treatment: 4 Multidisciplinary Care Plan reviewed with physician Active Inactive Venous Leg Ulcer Nursing Diagnoses: Knowledge deficit related to disease process and management Potential for venous Insuffiency (use before diagnosis confirmed) Goals: Patient will maintain optimal edema control Date Initiated: 04/02/2021 Target Resolution Date: 04/30/2021 Goal Status: Active Interventions: Assess peripheral edema status every visit. Compression as ordered Provide education on venous insufficiency Treatment Activities: Therapeutic compression applied : 04/02/2021 Notes: Wound/Skin Impairment Nursing Diagnoses: Impaired tissue integrity Knowledge deficit related to ulceration/compromised skin integrity Goals: Patient/caregiver will verbalize understanding of skin care regimen Date Initiated: 04/02/2021 Target Resolution Date: 04/30/2021 Goal Status: Active Ulcer/skin breakdown will have a volume reduction of 30% by week 4 Date Initiated: 04/02/2021 Target Resolution Date: 04/30/2021 Goal Status: Active Interventions: Assess patient/caregiver ability to obtain necessary supplies Assess patient/caregiver ability  to perform ulcer/skin care regimen upon admission and as needed Assess ulceration(s) every visit Provide education on ulcer and skin care Treatment Activities: Skin care regimen initiated : 04/02/2021 Topical wound management initiated : 04/02/2021 Notes: Electronic Signature(s) Signed: 05/02/2021 5:03:29 PM By: Rhae Hammock RN Entered By: Rhae Hammock on 05/01/2021 14:22:26 -------------------------------------------------------------------------------- Pain Assessment Details Patient Name: Date of Service: [name] [name]. 05/01/2021 2:30 PM Medical Record Number: [id number] Patient Account Number: [id number] Date of Birth/Sex: Treating RN: [birth year] (87 y.o. [name], [name] Primary Care Arnola Crittendon: [name] Other Clinician: Referring Jonathan Corpus: Treating Elba Dendinger/Extender: Carley Hammed in Treatment: 4 Active Problems Location of Pain Severity and Description of Pain Patient Has Paino No Site Locations Pain Management and Medication Current Pain Management: Electronic Signature(s) Signed: 05/02/2021 5:03:29 PM By: Rhae Hammock RN Entered By: Rhae Hammock on 05/01/2021 14:22:03 -------------------------------------------------------------------------------- Patient/Caregiver Education Details Patient Name: Date of Service: [name] [name] 11/16/2022andnbsp2:30 PM Medical Record Number: [id number] Patient Account Number: [id number] Date of Birth/Gender: Treating RN: 04-May-1925 (87 y.o. Erie Noe Primary Care Physician: [name] Other Clinician: Referring Physician: Treating Physician/Extender: Carley Hammed in Treatment: 4 Education Assessment Education Provided To: Patient Education Topics Provided Venous: Methods: Explain/Verbal Responses: Reinforcements needed, State content correctly Wound/Skin Impairment:  Methods: Explain/Verbal Responses: Reinforcements  needed, State content correctly Electronic Signature(s) Signed: 05/02/2021 5:03:29 PM By: Rhae Hammock RN Signed: 05/02/2021 5:03:29 PM By: Rhae Hammock RN Entered By: Rhae Hammock on 05/01/2021 14:22:40 -------------------------------------------------------------------------------- Wound Assessment Details Patient Name: Date of Service: MICHAEAL, DAVIS 05/01/2021 2:30 PM Medical Record Number: [id number] Patient Account Number: [id number] Date of Birth/Sex: Treating RN: [birth year] (87 y.o. [name] Primary Care Refujio Haymer: [name] Other Clinician: Referring Liya Strollo: Treating Cyris Maalouf/Extender: Carley Hammed in Treatment: 4 Wound Status Wound Number: 1 Primary Venous Leg Ulcer Etiology: Wound Location: Right, Posterior Lower Leg Wound Open Wounding Event: Gradually Appeared Status: Date Acquired: 03/19/2021 Comorbid Arrhythmia, Congestive Heart Failure, Coronary Artery Disease, Weeks Of Treatment: 4 History: Hypertension, Peripheral Venous Disease, Osteoarthritis, Seizure Clustered Wound: No Disorder Photos Wound Measurements Length: (cm) 5 Width: (cm) 3.5 Depth: (cm) 0.1 Area: (cm) 13.744 Volume: (cm) 1.374 % Reduction in Area: 46.8% % Reduction in Volume: 46.8% Epithelialization: Small (1-33%) Wound Description Classification: Full Thickness Without Exposed Support Structures Wound Margin: Flat and Intact Exudate Amount: Medium Exudate Type: Serous Exudate Color: amber Foul Odor After Cleansing: No Slough/Fibrino Yes Wound Bed Granulation Amount: Large (67-100%) Exposed Structure Granulation Quality: Pink Fascia Exposed: No Necrotic Amount: Small (1-33%) Fat Layer (Subcutaneous Tissue) Exposed: Yes Necrotic Quality: Adherent Slough Tendon Exposed: No Muscle Exposed: No Joint Exposed: No Bone Exposed: No Treatment Notes Wound #1 (Lower Leg) Wound Laterality: Right, Posterior Cleanser Soap and  Water Discharge Instruction: May shower and wash wound with dial antibacterial soap and water prior to dressing change. Wound Cleanser Discharge Instruction: Cleanse the wound with wound cleanser prior to applying a clean dressing using gauze sponges, not tissue or cotton balls. Peri-Wound Care Sween Lotion (Moisturizing lotion) Discharge Instruction: Apply moisturizing lotion to lower leg with dressing changes Topical Primary Dressing IODOFLEX 0.9% Cadexomer Iodine Pad 4x6 cm Discharge Instruction: or iodosorb gel Apply to wound bed as instructed Secondary Dressing Woven Gauze Sponge, Non-Sterile 4x4 in Discharge Instruction: Apply over primary dressing as directed. ABD Pad, 8x10 Discharge Instruction: Apply over primary dressing as directed. Secured With Compression Wrap ThreePress (3 layer compression wrap) Discharge Instruction: Apply three layer compression as directed. Compression Stockings Add-Ons Electronic Signature(s) Signed: 05/02/2021 5:23:58 PM By: Levan Hurst RN, BSN Entered By: Levan Hurst on 05/01/2021 14:56:49 -------------------------------------------------------------------------------- [place] Details Patient Name: Date of Service: [name] [name]. 05/01/2021 2:30 PM Medical Record Number: [id number] Patient Account Number: [id number] Date of Birth/Sex: Treating RN: 1924/10/03 (87 y.o. [name], [name] Primary Care Channelle Bottger: [name] Other Clinician: Referring Avigdor Dollar: Treating Thi Sisemore/Extender: Carley Hammed in Treatment: 4 Vital Signs Time Taken: 14:42 Temperature (F): 97.9 Height (in): 68 Pulse (bpm): 63 Weight (lbs): 170 Respiratory Rate (breaths/min): 18 Body Mass Index (BMI): 25.8 Blood Pressure (mmHg): 191/66 Reference Range: 80 - 120 mg / dl Electronic Signature(s) Signed: 05/02/2021 4:04:47 PM By: Sandre Kitty Entered By: Sandre Kitty on 05/01/2021 14:42:52

## 2021-05-02 NOTE — Progress Notes (Signed)
[name] [name], [name] [name] ([id number]) Visit Report for 05/01/2021 HPI Details Patient Name: Date of Service: [name] [name], [name] [name] 05/01/2021 2:30 PM Medical Record Number: [id number] Patient Account Number: [id number] Date of Birth/Sex: Treating RN: 28-Aug-1924 (87 y.o. [name] Primary Care Provider: [name] Other Clinician: Referring Provider: Treating Provider/Extender: Carley Hammed in Treatment: 4 History of Present Illness HPI Description: ADMISSION 04/02/2021 This is a pleasant 87 year old man sent to our clinic by Dr. Lyndel Safe from the skilled unit of [facility]. He has a wound on the right posterior calf. I am uncertain about the duration of this however it looks to be chronic. They are using Santyl covered with silver alginate he does not have any compression. The wound is 100% covered in very fibrinous adherent slough. It also appears that this is quite painful. The patient is exceptionally hard of hearing so it is difficult to get any additional history out of him. Past medical history includes hypertension, coronary artery disease, congestive heart failure, BPH, seizure disorder. He is nonambulatory for reasons that are not totally clear. [facility] lives [facility] [place]. ABI in our clinic was 1.03 on the right 11/2; 2-week follow-up. He is having the dressing changed at the facility. He arrives in the clinic with the area about the same in terms of surface area but better looking surface we have been using Iodoflex under 3 layer compression. Unfortunately we still do not have any history of exactly how long this is been there or how it happened. 11/16; 2-week follow-up. Nice improvement in the wound. He is using Iodoflex over 3 layer compression. Electronic Signature(s) Signed: 05/02/2021 1:42:03 PM By: Linton Ham MD Entered By: Linton Ham on 05/01/2021  15:06:19 -------------------------------------------------------------------------------- Physical Exam Details Patient Name: Date of Service: [name] [name], [name] [name] 05/01/2021 2:30 PM Medical Record Number: [id number] Patient Account Number: [id number] Date of Birth/Sex: Treating RN: 07-18-1924 (87 y.o. [name] Primary Care Provider: [name] Other Clinician: Referring Provider: Treating Provider/Extender: Carley Hammed in Treatment: 4 Constitutional Patient is hypertensive.. Pulse regular and within target range for patient.Marland Kitchen Respirations regular, non-labored and within target range.. Temperature is normal and within the target range for the patient.Marland Kitchen Appears in no distress. Cardiovascular Pedal pulses are palpable on the right. The wrap they are applying at the facility was only halfway up as We will have to asked them to make sure this is higher.. Notes Wound exam; posterior right calf. Much better wound surface nice epithelialization. Dimensions are improved. There was no need for mechanical debridement. No evidence of infection Electronic Signature(s) Signed: 05/02/2021 1:42:03 PM By: Linton Ham MD Entered By: Linton Ham on 05/01/2021 15:07:35 -------------------------------------------------------------------------------- Physician Orders Details Patient Name: Date of Service: [name] [name]. 05/01/2021 2:30 PM Medical Record Number: [id number] Patient Account Number: [id number] Date of Birth/Sex: Treating RN: [birth year] (87 y.o. [name] Primary Care Provider: [name] Other Clinician: Referring Provider: Treating Provider/Extender: Carley Hammed in Treatment: 4 Verbal / Phone Orders: No Diagnosis Coding ICD-10 Coding Code Description [phone] Chronic venous hypertension (idiopathic) with ulcer and inflammation of right lower extremity L97.218 Non-pressure chronic ulcer of right  calf with other specified severity Follow-up Appointments ppointment in 2 weeks. - with Dr. Dellia Nims Return A Bathing/ Shower/ Hygiene May shower with protection but do not get wound dressing(s) wet. - Ok to use cast protector, can also remove wrap prior to dressing change and wash wound with soap  and water on bath days. Edema Control - Lymphedema / SCD / Other Bilateral Lower Extremities Elevate legs to the level of the heart or above for 30 minutes daily and/or when sitting, a frequency of: - throughout the day Exercise regularly Wound Treatment Wound #1 - Lower Leg Wound Laterality: Right, Posterior Cleanser: Soap and Water 3 x Per Week/30 Days Discharge Instructions: May shower and wash wound with dial antibacterial soap and water prior to dressing change. Cleanser: Wound Cleanser 3 x Per Week/30 Days Discharge Instructions: Cleanse the wound with wound cleanser prior to applying a clean dressing using gauze sponges, not tissue or cotton balls. Peri-Wound Care: Sween Lotion (Moisturizing lotion) 3 x Per Week/30 Days Discharge Instructions: Apply moisturizing lotion to lower leg with dressing changes Prim Dressing: IODOFLEX 0.9% Cadexomer Iodine Pad 4x6 cm 3 x Per Week/30 Days ary Discharge Instructions: or iodosorb gel Apply to wound bed as instructed Secondary Dressing: Woven Gauze Sponge, Non-Sterile 4x4 in 3 x Per Week/30 Days Discharge Instructions: Apply over primary dressing as directed. Secondary Dressing: ABD Pad, 8x10 3 x Per Week/30 Days Discharge Instructions: Apply over primary dressing as directed. Compression Wrap: ThreePress (3 layer compression wrap) 3 x Per Week/30 Days Discharge Instructions: Apply three layer compression as directed. Electronic Signature(s) Signed: 05/02/2021 1:42:03 PM By: Linton Ham MD Signed: 05/02/2021 5:23:58 PM By: Levan Hurst RN, BSN Entered By: Levan Hurst on 05/01/2021  15:04:33 -------------------------------------------------------------------------------- Problem List Details Patient Name: Date of Service: [name] [name], [name] [name] 05/01/2021 2:30 PM Medical Record Number: [id number] Patient Account Number: [id number] Date of Birth/Sex: Treating RN: 23-Jan-1925 (87 y.o. 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 05/01/2021 2:30 PM Medical Record Number: [id number] Patient Account Number: [id number] Date of Birth/Sex: Treating RN: Jun 10, 1925 (87 y.o. [name] Primary Care Provider: [name] Other Clinician: Referring Provider: Treating Provider/Extender: Carley Hammed in Treatment: 4 Subjective History of Present Illness (HPI) ADMISSION 04/02/2021 This is a pleasant 87 year old man sent to our clinic by Dr. Lyndel Safe from the skilled unit of [facility]. He has a wound on the right posterior calf. I am uncertain about the duration of this however it looks to be chronic. They are using Santyl covered with silver alginate he does not have any compression. The wound is 100% covered in very fibrinous adherent slough. It also appears that this is quite painful. The patient is  exceptionally hard of hearing so it is difficult to get any additional history out of him. Past medical history includes hypertension, coronary artery disease, congestive heart failure, BPH, seizure disorder. He is nonambulatory for reasons that are not totally clear. He lives at the [facility] unit of [facility] [place]. ABI in our clinic was 1.03 on the right 11/2; 2-week follow-up. He is having the dressing changed at the facility. He arrives in the clinic with the area about the same in terms of surface area but better looking surface we have been using Iodoflex under 3 layer compression.  Unfortunately we still do not have any history of exactly how long this is been there or how it happened. 11/16; 2-week follow-up. Nice improvement in the wound. He is using Iodoflex over 3 layer compression. Objective Constitutional Patient is hypertensive.. Pulse regular and within target range for patient.Marland Kitchen Respirations regular, non-labored and within target range.. Temperature is normal and within the target range for the patient.Marland Kitchen Appears in no distress. Vitals Time Taken: 2:42 PM, Height: 68 in, Weight: 170 lbs, BMI: 25.8, Temperature: 97.9 F, Pulse: 63 bpm, Respiratory Rate: 18 breaths/min, Blood Pressure: 191/66 mmHg. Cardiovascular Pedal pulses are palpable on the right. The wrap they are applying at the facility was only halfway up as We will have to asked them to make sure this is higher.. General Notes: Wound exam; posterior right calf. Much better wound surface nice epithelialization. Dimensions are improved. There was no need for mechanical debridement. No evidence of infection Integumentary (Hair, Skin) Wound #1 status is Open. Original cause of wound was Gradually Appeared. The date acquired was: 03/19/2021. The wound has been in treatment 4 weeks. The wound is located on the Right,Posterior Lower Leg. The wound measures 5cm length x 3.5cm width x 0.1cm depth; 13.744cm^2 area and 1.374cm^3  volume. There is Fat Layer (Subcutaneous Tissue) exposed. There is a medium amount of serous drainage noted. The wound margin is flat and intact. There is large (67-100%) pink granulation within the wound bed. There is a small (1-33%) amount of necrotic tissue within the wound bed including Adherent Slough. Assessment Active Problems ICD-10 Chronic venous hypertension (idiopathic) with ulcer and inflammation of right lower extremity Non-pressure chronic ulcer of right calf with other specified severity Procedures Wound #1 Pre-procedure diagnosis of Wound #1 is a Venous Leg Ulcer located on the Right,Posterior Lower Leg . There was a Three Layer Compression Therapy Procedure by Levan Hurst, RN. Post procedure Diagnosis Wound #1: Same as Pre-Procedure Plan Follow-up Appointments: Return Appointment in 2 weeks. - with Dr. Arcola Jansky Shower/ Hygiene: May shower with protection but do not get wound dressing(s) wet. - Ok to use cast protector, can also remove wrap prior to dressing change and wash wound with soap and water on bath days. Edema Control - Lymphedema / SCD / Other: Elevate legs to the level of the heart or above for 30 minutes daily and/or when sitting, a frequency of: - throughout the day Exercise regularly WOUND #1: - Lower Leg Wound Laterality: Right, Posterior Cleanser: Soap and Water 3 x Per Week/30 Days Discharge Instructions: May shower and wash wound with dial antibacterial soap and water prior to dressing change. Cleanser: Wound Cleanser 3 x Per Week/30 Days Discharge Instructions: Cleanse the wound with wound cleanser prior to applying a clean dressing using gauze sponges, not tissue or cotton balls. Peri-Wound Care: Sween Lotion (Moisturizing lotion) 3 x Per Week/30 Days Discharge Instructions: Apply moisturizing lotion to lower leg with dressing changes Prim Dressing: IODOFLEX 0.9% Cadexomer Iodine Pad 4x6 cm 3 x Per Week/30 Days ary Discharge Instructions:  or iodosorb gel Apply to wound bed as instructed Secondary Dressing: Woven Gauze Sponge, Non-Sterile 4x4 in 3 x Per Week/30 Days Discharge Instructions: Apply over primary dressing as directed. Secondary Dressing: ABD Pad, 8x10 3 x Per Week/30 Days Discharge Instructions: Apply over primary dressing as directed. Com pression Wrap: ThreePress (3 layer compression wrap) 3 x Per Week/30 Days Discharge Instructions: Apply three layer compression as directed. 1. I am continuing with Iodoflex. There really had too much of this for the  surface area of the wound it just needs to be put on the wound itself 2 3 layer compression 3. In general things are quite a bit improved. Electronic Signature(s) Signed: 05/02/2021 1:42:03 PM By: Linton Ham MD Entered By: Linton Ham on 05/01/2021 15:08:42 -------------------------------------------------------------------------------- SuperBill Details Patient Name: Date of Service: [name] [name] 05/01/2021 Medical Record Number: [id number] Patient Account Number: [id number] Date of Birth/Sex: Treating RN: 1924-10-31 (87 y.o. 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 Facility Procedures CPT4 Code: [id number] Description: (Facility Use Only) [phone] - [facility] COMPRS LWR RT LEG Modifier: Quantity: 1 Physician Procedures : CPT4 Code Description Modifier [id number] - WC PHYS LEVEL 3 - EST PT ICD-10 Diagnosis Description I87.331 Chronic venous hypertension (idiopathic) with ulcer and inflammation of right lower extremity L97.218 Non-pressure chronic ulcer of right calf  with other  specified severity Quantity: 1 Electronic Signature(s) Signed: 05/02/2021 1:42:03 PM By: Linton Ham MD Signed: 05/02/2021 5:23:58 PM By: Levan Hurst RN, BSN Entered By: Levan Hurst on 05/01/2021 17:29:22

## 2021-05-03 ENCOUNTER — Non-Acute Institutional Stay (SKILLED_NURSING_FACILITY): Payer: Medicare HMO | Admitting: Nurse Practitioner

## 2021-05-03 ENCOUNTER — Encounter: Payer: Self-pay | Admitting: Nurse Practitioner

## 2021-05-03 DIAGNOSIS — N138 Other obstructive and reflux uropathy: Secondary | ICD-10-CM

## 2021-05-03 DIAGNOSIS — M25651 Stiffness of right hip, not elsewhere classified: Secondary | ICD-10-CM | POA: Diagnosis not present

## 2021-05-03 DIAGNOSIS — I10 Essential (primary) hypertension: Secondary | ICD-10-CM

## 2021-05-03 DIAGNOSIS — R2681 Unsteadiness on feet: Secondary | ICD-10-CM | POA: Diagnosis not present

## 2021-05-03 DIAGNOSIS — I2510 Atherosclerotic heart disease of native coronary artery without angina pectoris: Secondary | ICD-10-CM

## 2021-05-03 DIAGNOSIS — M25661 Stiffness of right knee, not elsewhere classified: Secondary | ICD-10-CM | POA: Diagnosis not present

## 2021-05-03 DIAGNOSIS — M6281 Muscle weakness (generalized): Secondary | ICD-10-CM | POA: Diagnosis not present

## 2021-05-03 DIAGNOSIS — R296 Repeated falls: Secondary | ICD-10-CM

## 2021-05-03 DIAGNOSIS — F5101 Primary insomnia: Secondary | ICD-10-CM

## 2021-05-03 DIAGNOSIS — M25662 Stiffness of left knee, not elsewhere classified: Secondary | ICD-10-CM | POA: Diagnosis not present

## 2021-05-03 DIAGNOSIS — R569 Unspecified convulsions: Secondary | ICD-10-CM

## 2021-05-03 DIAGNOSIS — I872 Venous insufficiency (chronic) (peripheral): Secondary | ICD-10-CM

## 2021-05-03 DIAGNOSIS — N401 Enlarged prostate with lower urinary tract symptoms: Secondary | ICD-10-CM | POA: Diagnosis not present

## 2021-05-03 DIAGNOSIS — I4891 Unspecified atrial fibrillation: Secondary | ICD-10-CM | POA: Diagnosis not present

## 2021-05-03 DIAGNOSIS — R69 Illness, unspecified: Secondary | ICD-10-CM | POA: Diagnosis not present

## 2021-05-03 DIAGNOSIS — R1310 Dysphagia, unspecified: Secondary | ICD-10-CM | POA: Diagnosis not present

## 2021-05-03 DIAGNOSIS — M159 Polyosteoarthritis, unspecified: Secondary | ICD-10-CM | POA: Diagnosis not present

## 2021-05-03 DIAGNOSIS — R1312 Dysphagia, oropharyngeal phase: Secondary | ICD-10-CM | POA: Diagnosis not present

## 2021-05-03 DIAGNOSIS — I5032 Chronic diastolic (congestive) heart failure: Secondary | ICD-10-CM

## 2021-05-03 DIAGNOSIS — R278 Other lack of coordination: Secondary | ICD-10-CM | POA: Diagnosis not present

## 2021-05-03 DIAGNOSIS — M25652 Stiffness of left hip, not elsewhere classified: Secondary | ICD-10-CM | POA: Diagnosis not present

## 2021-05-03 DIAGNOSIS — Z8673 Personal history of transient ischemic attack (TIA), and cerebral infarction without residual deficits: Secondary | ICD-10-CM

## 2021-05-03 DIAGNOSIS — K219 Gastro-esophageal reflux disease without esophagitis: Secondary | ICD-10-CM | POA: Diagnosis not present

## 2021-05-03 NOTE — Progress Notes (Signed)
Location:   [facility] Room Number: 9 A Place of Service:  SNF (31) Provider:  Lebron Nauert X, NP  [name], MD  Patient Care Team: [name], MD as PCP - General (Internal Medicine) Gatha Mayer, MD as Consulting Physician (Gastroenterology) [name], [name], MD as Consulting Physician (Cardiology) [name], MD as Attending Physician (Internal Medicine) Rashel Okeefe X, NP as Nurse Practitioner (Internal Medicine)  Extended Emergency Contact Information Primary Emergency Contact: Hapke,[name] Address: [place], [place] [address] [name] of [place] Phone: [phone] Mobile Phone: [phone] Relation: Daughter Secondary Emergency Contact: Coate,[name] Address: [place] [address] [name] of [place] Phone: [phone] Mobile Phone: [phone] Relation: Son  Code Status:  DNR Goals of care: Advanced Directive information Advanced Directives 05/03/2021  Does Patient Have a Medical Advance Directive? Yes  Type of Paramedic of [place];Living will  Does patient want to make changes to medical advance directive? No - Patient declined  Copy of [place] in Chart? Yes - validated most recent copy scanned in chart (See row information)  Pre-existing out of facility DNR order (yellow form or pink MOST form) -     Chief Complaint  Patient presents with   Medical Management of Chronic Issues    Routine visit   Quality Metric Gaps    Shingrix,     HPI:  Pt is a 87 y.o. male seen today for medical management of chronic diseases.    TIA, resolved right hand numbness, weakness, difficulty feeding self, changed to ASA 325mg  qd from 81mg  11/13/20             Recurrent falls, due to unsteady on his legs, attempting standing up or transfer with no assistance.              BPH, saw urology. Chronic cystitis               PVD, BLE brownish erythematous pigmentation, swelling, RLE venous ulcer, under wound care center treatment.              Afib, onset 09/2019, heart rate is conrolled, not a candidate for anticoagulation             CHF, EF 41-66%, grade 1 diastolic dysfunction, on Furosemide, saw Cardiology             HTN, blood pressure is controled on amlodipine, Lisinopril. Bun/creat 14/0.8 02/05/21             Hx of CAD, hyperlipidemia, on Crestor, ASA. LDL 50 02/05/21.  10/09/20 EKG SR vent rate 65, no significant ST-T changes.              GERD, takes Omeprazole. Hgb 14.2 11/14/20             Insomnia, stable, off Trazodone.              Chronic lower back pain/right hip pain,  on Gabapentin 200mg  qd             OA, s/p left hip surgery, chronic right hip pain, w/c helps, prn Tylenol             Seizure: takes Keppra 500mg  bid. F/u neurology    Past Medical History:  Diagnosis Date   Allergic rhinitis    Amnesia  Atherosclerotic heart disease of native coronary artery without angina pectoris    BPH (benign prostatic hyperplasia)    Bradycardia    Chronic diastolic (congestive) heart failure (HCC)    Colon polyps    Coronary artery disease    MODERATE   Dizziness    Edema of lower extremity    Erectile dysfunction    H. pylori infection    Hx of    Hard of hearing    Headache(784.0)    History of colon polyps 2009   Dr. Orr/Colonoscopy -small cecal adenoma   History of falling    Hx of colonoscopy 2009   Dr Lajoyce Corners, hemorrhoids & polyps   Hyperlipidemia    Hypertension    Hypertensive heart disease with heart failure ([place])    Hypokalemia    Internal hemorrhoids 2009   Dr. Lajoyce Corners Colonoscopy   Metabolic encephalopathy    Mitral valve regurgitation    Muscle weakness (generalized)    Pneumonia    Primary generalized (osteo)arthritis    Reflux esophagitis    Sepsis ([place]) 11/04/2018   Unspecified hemorrhoids    Unsteadiness on feet    Vitamin D deficiency    Past Surgical History:   Procedure Laterality Date   APPENDECTOMY     CARDIAC CATHETERIZATION  1997   REVEALED MILD TO MODERATE IRREGULARITIES. HIS LEFT EVNTRICULAR SYSTOLIC FUNCTION REVEALED NORMAL EJECTION FRACTION WITH EF OF 70%   CATARACT EXTRACTION, BILATERAL     COLONOSCOPY  multiple   fatty tumor removal     benign   HEMORROIDECTOMY     HIP ARTHROPLASTY Left 08/17/2013   Procedure: ARTHROPLASTY BIPOLAR HIP;  Surgeon: Gearlean Alf, MD;  Location: [facility];  Service: Orthopedics;  Laterality: Left;   INGUINAL HERNIA REPAIR Bilateral 2006   lower back surgery  2006   NASAL SEPTUM SURGERY     TONSILLECTOMY     UPPER GASTROINTESTINAL ENDOSCOPY      No Known Allergies  Allergies as of 05/03/2021   No Known Allergies      Medication List        Accurate as of May 03, 2021 11:59 PM. If you have any questions, ask your nurse or doctor.          STOP taking these medications    mupirocin ointment 2 % Commonly known as: BACTROBAN Stopped by: Seline Enzor X Bindi Klomp, NP   polyvinyl alcohol 1.4 % ophthalmic solution Commonly known as: LIQUIFILM TEARS Stopped by: Aireonna Bauer X Lyndsi Altic, NP       TAKE these medications    acetaminophen 500 MG tablet Commonly known as: TYLENOL Take 500 mg by mouth every 8 (eight) hours as needed.   amLODipine 5 MG tablet Commonly known as: NORVASC Take 5 mg by mouth daily.   aspirin 325 MG tablet Take 325 mg by mouth daily.   cadexomer iodine 0.9 % gel Commonly known as: IODOSORB Apply 1 application topically 3 (three) times a week. Once A Day on Mon, Wed, Fri   DEXTRAN 70-HYPROMELLOSE OP Apply 1 drop to eye 3 (three) times daily as needed. Special Instructions: as needed for dry eyes/itching   furosemide 40 MG tablet Commonly known as: LASIX Take 40 mg by mouth daily.   hydrocortisone 2.5 % rectal cream Commonly known as: ANUSOL-HC Place 1 application rectally 2 (two) times daily as needed for anal itching or hemorrhoids (USE PERINEAL APPLICATOR). With perineal  applicator   levETIRAcetam 500 MG tablet Commonly known as: KEPPRA Take 1 tablet (500 mg  total) by mouth 2 (two) times daily.   lisinopril 40 MG tablet Commonly known as: ZESTRIL Take 40 mg by mouth daily.   METHOCARBAMOL PO Take 250 mg by mouth as needed. Take 250 mg by mouth in the morning and an additional 250 mg once a day as needed for muscle spasms   omeprazole 20 MG capsule Commonly known as: PRILOSEC Take 20 mg by mouth daily.   potassium chloride SA 20 MEQ tablet Commonly known as: KLOR-CON Take 20 mEq by mouth daily.   rosuvastatin 5 MG tablet Commonly known as: CRESTOR Take 5 mg by mouth daily.   Thera-M Tabs Take 1 tablet by mouth daily.   vitamin B-12 1000 MCG tablet Commonly known as: CYANOCOBALAMIN Take 1,000 mcg by mouth daily.        Review of Systems  Constitutional:  Negative for fatigue, fever and unexpected weight change.  HENT:  Positive for hearing loss. Negative for congestion and voice change.   Eyes:  Negative for visual disturbance.  Respiratory:  Negative for cough, shortness of breath and wheezing.   Cardiovascular:  Positive for leg swelling.  Gastrointestinal:  Negative for abdominal pain and constipation.  Genitourinary:  Positive for frequency. Negative for difficulty urinating and urgency.  Musculoskeletal:  Positive for arthralgias, back pain and gait problem.       Lower back, right hip/thigh pain   Skin:  Positive for wound.       Chronic venous insufficiency skin changes BLE. Venous ulcer RLE is covered in dressing during my examination today.   Neurological:  Negative for seizures, weakness and headaches.       Memory lapses. Tingling in toes. Right hand weakness/numbness is improved, placed ASA 325 11/13/20  Psychiatric/Behavioral:  Negative for behavioral problems and sleep disturbance. The patient is not nervous/anxious.    Immunization History  Administered Date(s) Administered   Influenza, High Dose Seasonal PF  03/19/2019, 03/19/2019   Influenza-Unspecified 04/08/2016, 03/27/2020, 04/03/2021   Moderna Sars-Covid-2 Vaccination 06/18/2019, 07/16/2019, 04/24/2020, 11/13/2020   Pfizer Covid-19 Vaccine Bivalent Booster 27yrs & up 03/05/2021   Pneumococcal Conjugate-13 06/02/2013   Pneumococcal Polysaccharide-23 04/07/2001   Tetanus 08/14/2013   Pertinent  Health Maintenance Due  Topic Date Due   INFLUENZA VACCINE  Completed   Fall Risk 09/21/2019 09/22/2019 09/22/2019 09/23/2019 01/12/2020  Falls in the past year? - - - - -  Was there an injury with Fall? - - - - -  Was there an injury with Fall? - - - - -  Patient Fall Risk Level High fall risk High fall risk High fall risk High fall risk High fall risk  Fall risk Follow up - - - - -   Functional Status Survey:    Vitals:   05/03/21 1331  BP: 130/66  Pulse: 72  Resp: 20  Temp: 98.1 F (36.7 C)  SpO2: 97%  Weight: 174 lb (78.9 kg)  Height: 5\' 7"  (1.702 m)   Body mass index is 27.25 kg/m. Physical Exam Vitals and nursing note reviewed.  Constitutional:      Appearance: Normal appearance.  HENT:     Head: Normocephalic and atraumatic.     Mouth/Throat:     Mouth: Mucous membranes are moist.  Eyes:     Conjunctiva/sclera: Conjunctivae normal.     Pupils: Pupils are equal, round, and reactive to light.  Cardiovascular:     Rate and Rhythm: Normal rate and regular rhythm.     Heart sounds: Murmur heard.  Pulmonary:  Breath sounds: No rales.  Abdominal:     General: Bowel sounds are normal.     Palpations: Abdomen is soft.     Tenderness: There is no abdominal tenderness.  Genitourinary:    Comments: External hemorrhoids, no bleeding or injury.  Musculoskeletal:     Cervical back: Normal range of motion and neck supple. No muscular tenderness.     Right lower leg: Edema present.     Left lower leg: Edema present.     Comments: 1+ edema BLE. W/c for mobility.   Skin:    General: Skin is warm and dry.     Findings: Erythema  present.     Comments: Chronic venous insufficiency skin changes BLE, erythematous. Venous ulcer RLE is covered in dressing, the patient denied pain.   Neurological:     General: No focal deficit present.     Mental Status: He is alert. Mental status is at baseline.     Motor: No weakness.     Coordination: Coordination normal.     Gait: Gait abnormal.     Comments: Oriented to person, place.   Psychiatric:        Mood and Affect: Mood normal.        Behavior: Behavior normal.        Thought Content: Thought content normal.    Labs reviewed: Recent Labs    10/09/20 1607 11/14/20 0000 02/05/21 0000  NA 139 137 137  K 4.6 4.3 4.0  CL 104 102 102  CO2 28* 28* 28*  BUN 15 17 14   CREATININE 1.1 0.9 0.8  CALCIUM 8.8 9.2 8.7   Recent Labs    10/09/20 1607 11/14/20 0000  AST 16 18  ALT 14 20  ALKPHOS 54 63  ALBUMIN 3.4* 3.9   Recent Labs    09/25/20 0000 10/09/20 1607 11/14/20 0000  WBC 7.9 8.9 11.5  NEUTROABS  --  5,963.00 7,889.00  HGB 14.1 13.5 14.2  HCT 42 39* 43  PLT 218 236 231   Lab Results  Component Value Date   TSH 1.77 04/06/2020   No results found for: HGBA1C Lab Results  Component Value Date   CHOL 109 02/05/2021   HDL 45 02/05/2021   LDLCALC 50 02/05/2021   TRIG 63 02/05/2021   CHOLHDL 2.7 02/17/2017    Significant Diagnostic Results in last 30 days:  No results found.  Assessment/Plan Hypertension blood pressure is controled on amlodipine, Lisinopril. Bun/creat 14/0.8 02/05/21  CAD (coronary artery disease) Hx of CAD, hyperlipidemia, on Crestor, ASA. LDL 50 02/05/21.  10/09/20 EKG SR vent rate 65, no significant ST-T changes.   GERD (gastroesophageal reflux disease) takes Omeprazole. Hgb 14.2 11/14/20  Insomnia stable, off Trazodone.   Osteoarthritis, multiple sites Chronic lower back pain/right hip pain,  on Gabapentin. OA, s/p left hip surgery, chronic right hip pain, w/c helps, prn Tylenol  Seizure ([place])  takes Keppra 500mg  bid.  F/u neurology   Chronic diastolic heart failure (HCC) EF 17-71%, grade 1 diastolic dysfunction, on Furosemide, saw Cardiology  New onset atrial fibrillation ([place]) nset 09/2019, heart rate is conrolled, not a candidate for anticoagulation  Venous insufficiency (chronic) (peripheral) BLE brownish erythematous pigmentation, swelling, RLE venous ulcer, under wound care center treatment.   BPH (benign prostatic hyperplasia) saw urology. Chronic cystitis  Recurrent falls , due to unsteady on his legs, attempting standing up or transfer with no assistance.   History of TIA (transient ischemic attack) resolved right hand numbness, weakness, difficulty feeding self,  changed to ASA 325mg  qd from 81mg  11/13/20    Family/ staff Communication: plan of care reviewed with the patient and charge nurse.   Labs/tests ordered:  none  Time spend 35 minutes.

## 2021-05-05 DIAGNOSIS — M25661 Stiffness of right knee, not elsewhere classified: Secondary | ICD-10-CM | POA: Diagnosis not present

## 2021-05-05 DIAGNOSIS — R2681 Unsteadiness on feet: Secondary | ICD-10-CM | POA: Diagnosis not present

## 2021-05-05 DIAGNOSIS — M6281 Muscle weakness (generalized): Secondary | ICD-10-CM | POA: Diagnosis not present

## 2021-05-05 DIAGNOSIS — R1310 Dysphagia, unspecified: Secondary | ICD-10-CM | POA: Diagnosis not present

## 2021-05-05 DIAGNOSIS — M25662 Stiffness of left knee, not elsewhere classified: Secondary | ICD-10-CM | POA: Diagnosis not present

## 2021-05-05 DIAGNOSIS — R278 Other lack of coordination: Secondary | ICD-10-CM | POA: Diagnosis not present

## 2021-05-05 DIAGNOSIS — R1312 Dysphagia, oropharyngeal phase: Secondary | ICD-10-CM | POA: Diagnosis not present

## 2021-05-05 DIAGNOSIS — M25651 Stiffness of right hip, not elsewhere classified: Secondary | ICD-10-CM | POA: Diagnosis not present

## 2021-05-05 DIAGNOSIS — K219 Gastro-esophageal reflux disease without esophagitis: Secondary | ICD-10-CM | POA: Diagnosis not present

## 2021-05-05 DIAGNOSIS — M25652 Stiffness of left hip, not elsewhere classified: Secondary | ICD-10-CM | POA: Diagnosis not present

## 2021-05-06 DIAGNOSIS — R1310 Dysphagia, unspecified: Secondary | ICD-10-CM | POA: Diagnosis not present

## 2021-05-06 DIAGNOSIS — M25662 Stiffness of left knee, not elsewhere classified: Secondary | ICD-10-CM | POA: Diagnosis not present

## 2021-05-06 DIAGNOSIS — M25652 Stiffness of left hip, not elsewhere classified: Secondary | ICD-10-CM | POA: Diagnosis not present

## 2021-05-06 DIAGNOSIS — M25651 Stiffness of right hip, not elsewhere classified: Secondary | ICD-10-CM | POA: Diagnosis not present

## 2021-05-06 DIAGNOSIS — M25661 Stiffness of right knee, not elsewhere classified: Secondary | ICD-10-CM | POA: Diagnosis not present

## 2021-05-06 DIAGNOSIS — K219 Gastro-esophageal reflux disease without esophagitis: Secondary | ICD-10-CM | POA: Diagnosis not present

## 2021-05-06 DIAGNOSIS — R278 Other lack of coordination: Secondary | ICD-10-CM | POA: Diagnosis not present

## 2021-05-06 DIAGNOSIS — M6281 Muscle weakness (generalized): Secondary | ICD-10-CM | POA: Diagnosis not present

## 2021-05-06 DIAGNOSIS — R1312 Dysphagia, oropharyngeal phase: Secondary | ICD-10-CM | POA: Diagnosis not present

## 2021-05-06 DIAGNOSIS — R2681 Unsteadiness on feet: Secondary | ICD-10-CM | POA: Diagnosis not present

## 2021-05-06 NOTE — Assessment & Plan Note (Signed)
Chronic lower back pain/right hip pain,  on Gabapentin              OA, s/p left hip surgery, chronic right hip pain, w/c helps, prn Tylenol 

## 2021-05-06 NOTE — Assessment & Plan Note (Signed)
resolved right hand numbness, weakness, difficulty feeding self, changed to ASA 325mg  qd from 81mg  11/13/20

## 2021-05-06 NOTE — Assessment & Plan Note (Signed)
blood pressure is controled on amlodipine, Lisinopril. Bun/creat 14/0.8 02/05/21

## 2021-05-06 NOTE — Assessment & Plan Note (Signed)
Hx of CAD, hyperlipidemia, on Crestor, ASA. LDL 50 02/05/21.  10/09/20 EKG SR vent rate 65, no significant ST-T changes.

## 2021-05-06 NOTE — Assessment & Plan Note (Signed)
nset 09/2019, heart rate is conrolled, not a candidate for anticoagulation

## 2021-05-06 NOTE — Assessment & Plan Note (Signed)
,   due to unsteady on his legs, attempting standing up or transfer with no assistance.  

## 2021-05-06 NOTE — Assessment & Plan Note (Signed)
takes Omeprazole. Hgb 14.2 11/14/20

## 2021-05-06 NOTE — Assessment & Plan Note (Signed)
stable, off Trazodone.  

## 2021-05-06 NOTE — Assessment & Plan Note (Signed)
BLE brownish erythematous pigmentation, swelling, RLE venous ulcer, under wound care center treatment.

## 2021-05-06 NOTE — Assessment & Plan Note (Signed)
saw urology. Chronic cystitis 

## 2021-05-06 NOTE — Assessment & Plan Note (Signed)
EF 34-03%, grade 1 diastolic dysfunction, on Furosemide, saw Cardiology

## 2021-05-06 NOTE — Assessment & Plan Note (Signed)
takes Keppra 500mg  bid. F/u neurology

## 2021-05-07 ENCOUNTER — Encounter: Payer: Self-pay | Admitting: Nurse Practitioner

## 2021-05-07 DIAGNOSIS — I872 Venous insufficiency (chronic) (peripheral): Secondary | ICD-10-CM | POA: Diagnosis not present

## 2021-05-07 DIAGNOSIS — L89219 Pressure ulcer of right hip, unspecified stage: Secondary | ICD-10-CM | POA: Diagnosis not present

## 2021-05-08 ENCOUNTER — Ambulatory Visit: Payer: Medicare HMO | Admitting: Adult Health

## 2021-05-08 DIAGNOSIS — K219 Gastro-esophageal reflux disease without esophagitis: Secondary | ICD-10-CM | POA: Diagnosis not present

## 2021-05-08 DIAGNOSIS — R1310 Dysphagia, unspecified: Secondary | ICD-10-CM | POA: Diagnosis not present

## 2021-05-08 DIAGNOSIS — M25661 Stiffness of right knee, not elsewhere classified: Secondary | ICD-10-CM | POA: Diagnosis not present

## 2021-05-08 DIAGNOSIS — M25662 Stiffness of left knee, not elsewhere classified: Secondary | ICD-10-CM | POA: Diagnosis not present

## 2021-05-08 DIAGNOSIS — R1312 Dysphagia, oropharyngeal phase: Secondary | ICD-10-CM | POA: Diagnosis not present

## 2021-05-08 DIAGNOSIS — R278 Other lack of coordination: Secondary | ICD-10-CM | POA: Diagnosis not present

## 2021-05-08 DIAGNOSIS — M25652 Stiffness of left hip, not elsewhere classified: Secondary | ICD-10-CM | POA: Diagnosis not present

## 2021-05-08 DIAGNOSIS — M25651 Stiffness of right hip, not elsewhere classified: Secondary | ICD-10-CM | POA: Diagnosis not present

## 2021-05-08 DIAGNOSIS — R2681 Unsteadiness on feet: Secondary | ICD-10-CM | POA: Diagnosis not present

## 2021-05-08 DIAGNOSIS — M6281 Muscle weakness (generalized): Secondary | ICD-10-CM | POA: Diagnosis not present

## 2021-05-09 DIAGNOSIS — M25662 Stiffness of left knee, not elsewhere classified: Secondary | ICD-10-CM | POA: Diagnosis not present

## 2021-05-09 DIAGNOSIS — R1310 Dysphagia, unspecified: Secondary | ICD-10-CM | POA: Diagnosis not present

## 2021-05-09 DIAGNOSIS — M25651 Stiffness of right hip, not elsewhere classified: Secondary | ICD-10-CM | POA: Diagnosis not present

## 2021-05-09 DIAGNOSIS — M25652 Stiffness of left hip, not elsewhere classified: Secondary | ICD-10-CM | POA: Diagnosis not present

## 2021-05-09 DIAGNOSIS — R2681 Unsteadiness on feet: Secondary | ICD-10-CM | POA: Diagnosis not present

## 2021-05-09 DIAGNOSIS — K219 Gastro-esophageal reflux disease without esophagitis: Secondary | ICD-10-CM | POA: Diagnosis not present

## 2021-05-09 DIAGNOSIS — R278 Other lack of coordination: Secondary | ICD-10-CM | POA: Diagnosis not present

## 2021-05-09 DIAGNOSIS — M6281 Muscle weakness (generalized): Secondary | ICD-10-CM | POA: Diagnosis not present

## 2021-05-09 DIAGNOSIS — M25661 Stiffness of right knee, not elsewhere classified: Secondary | ICD-10-CM | POA: Diagnosis not present

## 2021-05-09 DIAGNOSIS — R1312 Dysphagia, oropharyngeal phase: Secondary | ICD-10-CM | POA: Diagnosis not present

## 2021-05-13 DIAGNOSIS — R1310 Dysphagia, unspecified: Secondary | ICD-10-CM | POA: Diagnosis not present

## 2021-05-13 DIAGNOSIS — M25661 Stiffness of right knee, not elsewhere classified: Secondary | ICD-10-CM | POA: Diagnosis not present

## 2021-05-13 DIAGNOSIS — R1312 Dysphagia, oropharyngeal phase: Secondary | ICD-10-CM | POA: Diagnosis not present

## 2021-05-13 DIAGNOSIS — M6281 Muscle weakness (generalized): Secondary | ICD-10-CM | POA: Diagnosis not present

## 2021-05-13 DIAGNOSIS — K219 Gastro-esophageal reflux disease without esophagitis: Secondary | ICD-10-CM | POA: Diagnosis not present

## 2021-05-13 DIAGNOSIS — M25652 Stiffness of left hip, not elsewhere classified: Secondary | ICD-10-CM | POA: Diagnosis not present

## 2021-05-13 DIAGNOSIS — R2681 Unsteadiness on feet: Secondary | ICD-10-CM | POA: Diagnosis not present

## 2021-05-13 DIAGNOSIS — M25651 Stiffness of right hip, not elsewhere classified: Secondary | ICD-10-CM | POA: Diagnosis not present

## 2021-05-13 DIAGNOSIS — R278 Other lack of coordination: Secondary | ICD-10-CM | POA: Diagnosis not present

## 2021-05-13 DIAGNOSIS — M25662 Stiffness of left knee, not elsewhere classified: Secondary | ICD-10-CM | POA: Diagnosis not present

## 2021-05-14 DIAGNOSIS — K219 Gastro-esophageal reflux disease without esophagitis: Secondary | ICD-10-CM | POA: Diagnosis not present

## 2021-05-14 DIAGNOSIS — M25652 Stiffness of left hip, not elsewhere classified: Secondary | ICD-10-CM | POA: Diagnosis not present

## 2021-05-14 DIAGNOSIS — R2681 Unsteadiness on feet: Secondary | ICD-10-CM | POA: Diagnosis not present

## 2021-05-14 DIAGNOSIS — M6281 Muscle weakness (generalized): Secondary | ICD-10-CM | POA: Diagnosis not present

## 2021-05-14 DIAGNOSIS — R1310 Dysphagia, unspecified: Secondary | ICD-10-CM | POA: Diagnosis not present

## 2021-05-14 DIAGNOSIS — M25651 Stiffness of right hip, not elsewhere classified: Secondary | ICD-10-CM | POA: Diagnosis not present

## 2021-05-14 DIAGNOSIS — M25661 Stiffness of right knee, not elsewhere classified: Secondary | ICD-10-CM | POA: Diagnosis not present

## 2021-05-14 DIAGNOSIS — R1312 Dysphagia, oropharyngeal phase: Secondary | ICD-10-CM | POA: Diagnosis not present

## 2021-05-14 DIAGNOSIS — M25662 Stiffness of left knee, not elsewhere classified: Secondary | ICD-10-CM | POA: Diagnosis not present

## 2021-05-14 DIAGNOSIS — R278 Other lack of coordination: Secondary | ICD-10-CM | POA: Diagnosis not present

## 2021-05-15 ENCOUNTER — Encounter (HOSPITAL_BASED_OUTPATIENT_CLINIC_OR_DEPARTMENT_OTHER): Payer: Medicare HMO | Admitting: Internal Medicine

## 2021-05-15 ENCOUNTER — Other Ambulatory Visit: Payer: Self-pay

## 2021-05-15 DIAGNOSIS — I110 Hypertensive heart disease with heart failure: Secondary | ICD-10-CM | POA: Diagnosis not present

## 2021-05-15 DIAGNOSIS — L97212 Non-pressure chronic ulcer of right calf with fat layer exposed: Secondary | ICD-10-CM | POA: Diagnosis not present

## 2021-05-15 DIAGNOSIS — G40909 Epilepsy, unspecified, not intractable, without status epilepticus: Secondary | ICD-10-CM | POA: Diagnosis not present

## 2021-05-15 DIAGNOSIS — I87331 Chronic venous hypertension (idiopathic) with ulcer and inflammation of right lower extremity: Secondary | ICD-10-CM | POA: Diagnosis not present

## 2021-05-15 DIAGNOSIS — I2510 Atherosclerotic heart disease of native coronary artery without angina pectoris: Secondary | ICD-10-CM | POA: Diagnosis not present

## 2021-05-15 DIAGNOSIS — L97218 Non-pressure chronic ulcer of right calf with other specified severity: Secondary | ICD-10-CM | POA: Diagnosis not present

## 2021-05-15 DIAGNOSIS — I509 Heart failure, unspecified: Secondary | ICD-10-CM | POA: Diagnosis not present

## 2021-05-15 DIAGNOSIS — N400 Enlarged prostate without lower urinary tract symptoms: Secondary | ICD-10-CM | POA: Diagnosis not present

## 2021-05-15 NOTE — Progress Notes (Signed)
ASER, NYLUND ([id number]) Visit Report for 05/15/2021 Arrival Information Details Patient Name: Date of Service: [name] [name], [name] [name] 05/15/2021 2:00 PM Medical Record Number: [id number] Patient Account Number: [id number] Date of Birth/Sex: Treating RN: 1925-04-21 (87 y.o. [name] Primary Care Krystle Oberman: [name] Other Clinician: Referring Shai Rasmussen: Treating Imre Vecchione/Extender: Carley Hammed in Treatment: 6 Visit Information History Since Last Visit Added or deleted any medications: No Patient Arrived: Wheel Chair Any new allergies or adverse reactions: No Arrival Time: 14:46 Had a fall or experienced change in No Accompanied By: self activities of daily living that may affect Transfer Assistance: Manual risk of falls: Patient Identification Verified: Yes Signs or symptoms of abuse/neglect since last visito No Secondary Verification Process Completed: Yes Hospitalized since last visit: No Patient Requires Transmission-Based Precautions: No Implantable device outside of the clinic excluding No Patient Has Alerts: Yes cellular tissue based products placed in the center Patient Alerts: DNR since last visit: Has Dressing in Place as Prescribed: Yes Pain Present Now: No Electronic Signature(s) Signed: 05/15/2021 3:47:30 PM By: Sandre Kitty Entered By: Sandre Kitty on 05/15/2021 14:47:08 -------------------------------------------------------------------------------- Compression Therapy Details Patient Name: Date of Service: [name] [name]. 05/15/2021 2:00 PM Medical Record Number: [id number] Patient Account Number: [id number] Date of Birth/Sex: Treating RN: [birth year] (87 y.o. [name] Primary Care [place] Giammona: [name] Other Clinician: Referring Yuriana Gaal: Treating Satonya Lux/Extender: Carley Hammed in Treatment: 6 Compression Therapy Performed for Wound Assessment: Wound #1  Right,Posterior Lower Leg Performed By: Clinician Levan Hurst, RN Compression Type: Three Layer Post Procedure Diagnosis Same as Pre-procedure Electronic Signature(s) Signed: 05/15/2021 5:07:48 PM By: Levan Hurst RN, BSN Entered By: Levan Hurst on 05/15/2021 15:30:35 -------------------------------------------------------------------------------- Encounter Discharge Information Details Patient Name: Date of Service: [name] [name]. 05/15/2021 2:00 PM Medical Record Number: [id number] Patient Account Number: [id number] Date of Birth/Sex: Treating RN: [birth year] (87 y.o. [name] Primary Care Oliva Montecalvo: [name] Other Clinician: Referring Hailey Miles: Treating Jabreel Chimento/Extender: Carley Hammed in Treatment: 6 Encounter Discharge Information Items Discharge Condition: Stable Ambulatory Status: Wheelchair Discharge Destination: Home Transportation: Private Auto Accompanied By: alone Schedule Follow-up Appointment: Yes Clinical Summary of Care: Patient Declined Electronic Signature(s) Signed: 05/15/2021 5:07:48 PM By: Levan Hurst RN, BSN Entered By: Levan Hurst on 05/15/2021 17:02:50 -------------------------------------------------------------------------------- Lower Extremity Assessment Details Patient Name: Date of Service: [name] [name]. 05/15/2021 2:00 PM Medical Record Number: [id number] Patient Account Number: [id number] Date of Birth/Sex: Treating RN: Sep 21, 1924 (87 y.o. [name] Primary Care Urho Rio: [name] Other Clinician: Referring Maclean Foister: Treating Jun Rightmyer/Extender: Carley Hammed in Treatment: 6 Edema Assessment Assessed: Shirlyn Goltz: No] Patrice Paradise: No] Edema: [Left: Ye] [Right: s] Calf Left: Right: Point of Measurement: From Medial Instep 37.5 cm Ankle Left: Right: Point of Measurement: From Medial Instep 21 cm Vascular Assessment Pulses: Dorsalis  Pedis Palpable: [Right:Yes] Electronic Signature(s) Signed: 05/15/2021 5:07:48 PM By: Levan Hurst RN, BSN Signed: 05/15/2021 5:48:07 PM By: Dellie Catholic RN Entered By: Dellie Catholic on 05/15/2021 15:14:56 -------------------------------------------------------------------------------- Multi Wound Chart Details Patient Name: Date of Service: [name] [name]. 05/15/2021 2:00 PM Medical Record Number: [id number] Patient Account Number: [id number] Date of Birth/Sex: Treating RN: 18-Oct-1924 (87 y.o. [name] Primary Care Neamiah Sciarra: [name] Other Clinician: Referring Jivan Symanski: Treating Johneric Mcfadden/Extender: Carley Hammed in Treatment: 6 Vital Signs Height(in): 68 Pulse(bpm): 69 Weight(lbs): 170 Blood Pressure(mmHg): 182/61 Body Mass Index(BMI): 26 Temperature(F): 97.8 Respiratory Rate(breaths/min): 18 Photos: [N/A:N/A] Right, Posterior Lower Leg N/A N/A  Wound Location: Gradually Appeared N/A N/A Wounding Event: Venous Leg Ulcer N/A N/A Primary Etiology: Arrhythmia, Congestive Heart Failure, N/A N/A Comorbid History: Coronary Artery Disease, Hypertension, Peripheral Venous Disease, Osteoarthritis, Seizure Disorder 03/19/2021 N/A N/A Date Acquired: 6 N/A N/A Weeks of Treatment: Open N/A N/A Wound Status: 1.6x0.6x0.1 N/A N/A Measurements L x W x D (cm) 0.754 N/A N/A A (cm) : rea 0.075 N/A N/A Volume (cm) : 97.10% N/A N/A % Reduction in Area: 97.10% N/A N/A % Reduction in Volume: Full Thickness Without Exposed N/A N/A Classification: Support Structures Medium N/A N/A Exudate Amount: Serosanguineous N/A N/A Exudate Type: red, brown N/A N/A Exudate Color: Flat and Intact N/A N/A Wound Margin: Large (67-100%) N/A N/A Granulation Amount: Pink N/A N/A Granulation Quality: Small (1-33%) N/A N/A Necrotic Amount: Fat Layer (Subcutaneous Tissue): Yes N/A N/A Exposed Structures: Fascia: No Tendon: No Muscle:  No Joint: No Bone: No Small (1-33%) N/A N/A Epithelialization: Compression Therapy N/A N/A Procedures Performed: Treatment Notes Electronic Signature(s) Signed: 05/15/2021 4:53:46 PM By: Linton Ham MD Signed: 05/15/2021 5:07:48 PM By: Levan Hurst RN, BSN Entered By: Linton Ham on 05/15/2021 15:43:23 -------------------------------------------------------------------------------- Multi-Disciplinary Care Plan Details Patient Name: Date of Service: [name] [name] 05/15/2021 2:00 PM Medical Record Number: [id number] Patient Account Number: [id number] Date of Birth/Sex: Treating RN: 1925-04-15 (86 y.o. [name] Primary Care Keyosha Tiedt: [name] Other Clinician: Referring Mirza Fessel: Treating Halsey Persaud/Extender: Carley Hammed in Treatment: 6 Multidisciplinary Care Plan reviewed with physician Active Inactive Venous Leg Ulcer Nursing Diagnoses: Knowledge deficit related to disease process and management Potential for venous Insuffiency (use before diagnosis confirmed) Goals: Patient will maintain optimal edema control Date Initiated: 04/02/2021 Target Resolution Date: 06/14/2021 Goal Status: Active Interventions: Assess peripheral edema status every visit. Compression as ordered Provide education on venous insufficiency Treatment Activities: Therapeutic compression applied : 04/02/2021 Notes: Wound/Skin Impairment Nursing Diagnoses: Impaired tissue integrity Knowledge deficit related to ulceration/compromised skin integrity Goals: Patient/caregiver will verbalize understanding of skin care regimen Date Initiated: 04/02/2021 Target Resolution Date: 06/14/2021 Goal Status: Active Ulcer/skin breakdown will have a volume reduction of 30% by week 4 Date Initiated: 04/02/2021 Date Inactivated: 05/15/2021 Target Resolution Date: 04/30/2021 Goal Status: Met Interventions: Assess patient/caregiver ability to obtain  necessary supplies Assess patient/caregiver ability to perform ulcer/skin care regimen upon admission and as needed Assess ulceration(s) every visit Provide education on ulcer and skin care Treatment Activities: Skin care regimen initiated : 04/02/2021 Topical wound management initiated : 04/02/2021 Notes: Electronic Signature(s) Signed: 05/15/2021 5:07:48 PM By: Levan Hurst RN, BSN Entered By: Levan Hurst on 05/15/2021 15:19:21 -------------------------------------------------------------------------------- Pain Assessment Details Patient Name: Date of Service: [name] [name]. 05/15/2021 2:00 PM Medical Record Number: [id number] Patient Account Number: [id number] Date of Birth/Sex: Treating RN: 05/28/1925 (87 y.o. [name] Primary Care Adeliz Tonkinson: [name] Other Clinician: Referring Denis Koppel: Treating Ethell Blatchford/Extender: Carley Hammed in Treatment: 6 Active Problems Location of Pain Severity and Description of Pain Patient Has Paino No Site Locations Pain Management and Medication Current Pain Management: Electronic Signature(s) Signed: 05/15/2021 3:47:30 PM By: Sandre Kitty Signed: 05/15/2021 5:07:48 PM By: Levan Hurst RN, BSN Entered By: Sandre Kitty on 05/15/2021 14:47:44 -------------------------------------------------------------------------------- Patient/Caregiver Education Details Patient Name: Date of Service: [name] [name] 11/30/2022andnbsp2:00 PM Medical Record Number: [id number] Patient Account Number: [id number] Date of Birth/Gender: Treating RN: 07-25-1924 (87 y.o. [name] Primary Care Physician: [name] Other Clinician: Referring Physician: Treating Physician/Extender: Carley Hammed in Treatment: 6 Education Assessment Education  Provided To: Patient Education Topics Provided Wound/Skin Impairment: Methods: Explain/Verbal Responses: State content  correctly Electronic Signature(s) Signed: 05/15/2021 5:07:48 PM By: Levan Hurst RN, BSN Entered By: Levan Hurst on 05/15/2021 15:19:41 -------------------------------------------------------------------------------- Wound Assessment Details Patient Name: Date of Service: [name] [name]. 05/15/2021 2:00 PM Medical Record Number: [id number] Patient Account Number: [id number] Date of Birth/Sex: Treating RN: 03-15-1925 (87 y.o. [name] Primary Care Aspynn Clover: [name] Other Clinician: Referring Maijor Hornig: Treating Hung Rhinesmith/Extender: Carley Hammed in Treatment: 6 Wound Status Wound Number: 1 Primary Venous Leg Ulcer Etiology: Wound Location: Right, Posterior Lower Leg Wound Open Wounding Event: Gradually Appeared Status: Date Acquired: 03/19/2021 Comorbid Arrhythmia, Congestive Heart Failure, Coronary Artery Disease, Weeks Of Treatment: 6 History: Hypertension, Peripheral Venous Disease, Osteoarthritis, Seizure Clustered Wound: No Disorder Photos Wound Measurements Length: (cm) 1.6 Width: (cm) 0.6 Depth: (cm) 0.1 Area: (cm) 0.754 Volume: (cm) 0.075 % Reduction in Area: 97.1% % Reduction in Volume: 97.1% Epithelialization: Small (1-33%) Tunneling: No Undermining: No Wound Description Classification: Full Thickness Without Exposed Support Structures Wound Margin: Flat and Intact Exudate Amount: Medium Exudate Type: Serosanguineous Exudate Color: red, brown Foul Odor After Cleansing: No Slough/Fibrino Yes Wound Bed Granulation Amount: Large (67-100%) Exposed Structure Granulation Quality: Pink Fascia Exposed: No Necrotic Amount: Small (1-33%) Fat Layer (Subcutaneous Tissue) Exposed: Yes Necrotic Quality: Adherent Slough Tendon Exposed: No Muscle Exposed: No Joint Exposed: No Bone Exposed: No Treatment Notes Wound #1 (Lower Leg) Wound Laterality: Right, Posterior Cleanser Soap and Water Discharge Instruction: May  shower and wash wound with dial antibacterial soap and water prior to dressing change. Wound Cleanser Discharge Instruction: Cleanse the wound with wound cleanser prior to applying a clean dressing using gauze sponges, not tissue or cotton balls. Peri-Wound Care Sween Lotion (Moisturizing lotion) Discharge Instruction: Apply moisturizing lotion to lower leg with dressing changes Topical Primary Dressing IODOFLEX 0.9% Cadexomer Iodine Pad 4x6 cm Discharge Instruction: or iodosorb gel Apply to wound bed as instructed Secondary Dressing Woven Gauze Sponge, Non-Sterile 4x4 in Discharge Instruction: Apply over primary dressing as directed. Secured With Compression Wrap ThreePress (3 layer compression wrap) Discharge Instruction: Apply three layer compression as directed. Compression Stockings Add-Ons Electronic Signature(s) Signed: 05/15/2021 5:07:48 PM By: Levan Hurst RN, BSN Signed: 05/15/2021 5:48:07 PM By: Dellie Catholic RN Entered By: Dellie Catholic on 05/15/2021 15:16:45 -------------------------------------------------------------------------------- Vitals Details Patient Name: Date of Service: [name] [name] 05/15/2021 2:00 PM Medical Record Number: [id number] Patient Account Number: [id number] Date of Birth/Sex: Treating RN: [birth year] (87 y.o. [name] Primary Care Katalina Magri: [name] Other Clinician: Referring Mirel Hundal: Treating Katesha Eichel/Extender: Carley Hammed in Treatment: 6 Vital Signs Time Taken: 14:47 Temperature (F): 97.8 Height (in): 68 Pulse (bpm): 71 Weight (lbs): 170 Respiratory Rate (breaths/min): 18 Body Mass Index (BMI): 25.8 Blood Pressure (mmHg): 182/61 Reference Range: 80 - 120 mg / dl Electronic Signature(s) Signed: 05/15/2021 3:47:30 PM By: Sandre Kitty Entered By: Sandre Kitty on 05/15/2021 14:47:29

## 2021-05-16 DIAGNOSIS — M6281 Muscle weakness (generalized): Secondary | ICD-10-CM | POA: Diagnosis not present

## 2021-05-16 DIAGNOSIS — M25651 Stiffness of right hip, not elsewhere classified: Secondary | ICD-10-CM | POA: Diagnosis not present

## 2021-05-16 DIAGNOSIS — R2681 Unsteadiness on feet: Secondary | ICD-10-CM | POA: Diagnosis not present

## 2021-05-16 DIAGNOSIS — W19XXXA Unspecified fall, initial encounter: Secondary | ICD-10-CM | POA: Diagnosis not present

## 2021-05-16 DIAGNOSIS — R278 Other lack of coordination: Secondary | ICD-10-CM | POA: Diagnosis not present

## 2021-05-16 DIAGNOSIS — M150 Primary generalized (osteo)arthritis: Secondary | ICD-10-CM | POA: Diagnosis not present

## 2021-05-16 DIAGNOSIS — M25662 Stiffness of left knee, not elsewhere classified: Secondary | ICD-10-CM | POA: Diagnosis not present

## 2021-05-16 DIAGNOSIS — M25652 Stiffness of left hip, not elsewhere classified: Secondary | ICD-10-CM | POA: Diagnosis not present

## 2021-05-16 DIAGNOSIS — M25661 Stiffness of right knee, not elsewhere classified: Secondary | ICD-10-CM | POA: Diagnosis not present

## 2021-05-16 NOTE — Progress Notes (Signed)
JAQUESE, IRVING ([id number]) Visit Report for 05/15/2021 HPI Details Patient Name: Date of Service: PERL, KERNEY 05/15/2021 2:00 PM Medical Record Number: [id number] Patient Account Number: [id number] Date of Birth/Sex: Treating RN: 24-Mar-1925 (87 y.o. [name] Primary Care Provider: [name] Other Clinician: Referring Provider: Treating Provider/Extender: Carley Hammed in Treatment: 6 History of Present Illness HPI Description: ADMISSION 04/02/2021 This is a pleasant 87 year old man sent to our clinic by Dr. Lyndel Safe from the [facility] unit of [facility] [place]. He has a wound on the right posterior calf. I am uncertain about the duration of this however it looks to be chronic. They are using Santyl covered with silver alginate he does not have any compression. The wound is 100% covered in very fibrinous adherent slough. It also appears that this is quite painful. The patient is exceptionally hard of hearing so it is difficult to get any additional history out of him. Past medical history includes hypertension, coronary artery disease, congestive heart failure, BPH, seizure disorder. He is nonambulatory for reasons that are not totally clear. He lives at the [facility] unit of [facility]. ABI in our clinic was 1.03 on the right 11/2; 2-week follow-up. He is having the dressing changed at the facility. He arrives in the clinic with the area about the same in terms of surface area but better looking surface we have been using Iodoflex under 3 layer compression. Unfortunately we still do not have any history of exactly how long this is been there or how it happened. 11/16; 2-week follow-up. Nice improvement in the wound. He is using Iodoflex over 3 layer compression. 11/32-week follow-up. [facility] is at [facility] [place]. We have been using Iodoflex under 3 layer compression. They are not wrapping his leg properly but  nevertheless the wound area has come down nicely. The patient requests coming here every 3 weeks instead of every 2 Electronic Signature(s) Signed: 05/15/2021 4:53:46 PM By: Linton Ham MD Entered By: Linton Ham on 05/15/2021 15:44:10 -------------------------------------------------------------------------------- Physical Exam Details Patient Name: Date of Service: [name]. 05/15/2021 2:00 PM Medical Record Number: [id number] Patient Account Number: [id number] Date of Birth/Sex: Treating RN: [birth year] (87 y.o. [name] Primary Care Provider: [name] Other Clinician: Referring Provider: Treating Provider/Extender: Carley Hammed in Treatment: 6 Constitutional Patient is hypertensive.. Pulse regular and within target range for patient.Marland Kitchen Respirations regular, non-labored and within target range.. Temperature is normal and within the target range for the patient.Marland Kitchen Appears in no distress. Cardiovascular Pedal pulses palpable on the right. Edema control is not well controlled above the level of the mid aspect of his leg. Notes Wound exam; posterior right calf. Much better in terms of surface area. Under illumination the wound has debris on it but it appears to debride off with wound cleanser and gauze fairly easily. No evidence of surrounding infection. The patient has significant venous insufficiency with secondary l lipodermatosclerosis Electronic Signature(s) Signed: 05/15/2021 4:53:46 PM By: Linton Ham MD Entered By: Linton Ham on 05/15/2021 15:45:36 -------------------------------------------------------------------------------- Physician Orders Details Patient Name: Date of Service: [name]. 05/15/2021 2:00 PM Medical Record Number: [id number] Patient Account Number: [id number] Date of Birth/Sex: Treating RN: 09/22/1924 (87 y.o. 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 - with Dr. Dellia Nims Bathing/ Shower/ Hygiene May shower with protection but do not get wound dressing(s) wet. - Ok to use cast protector, can also remove wrap prior to dressing change and wash wound with soap and water on bath days. Edema Control - Lymphedema / SCD / Other Bilateral Lower Extremities Elevate legs to the level of the heart or above for 30 minutes daily and/or when sitting, a frequency of: - throughout the day Exercise regularly Wound Treatment Wound #1 - Lower Leg Wound Laterality: Right, Posterior Cleanser: Soap and Water 3 x Per Week/30 Days Discharge Instructions: May shower and wash wound with dial antibacterial soap and water prior to dressing change. Cleanser: Wound Cleanser 3 x Per Week/30 Days Discharge Instructions: Cleanse the wound with wound cleanser prior to applying a clean dressing using gauze sponges, not tissue or cotton balls. Peri-Wound Care: Sween Lotion (Moisturizing lotion) 3 x Per Week/30 Days Discharge Instructions: Apply moisturizing lotion to lower leg with dressing changes Prim Dressing: IODOFLEX 0.9% Cadexomer Iodine Pad 4x6 cm 3 x Per Week/30 Days ary Discharge Instructions: or iodosorb gel Apply to wound bed as instructed Secondary Dressing: Woven Gauze Sponge, Non-Sterile 4x4 in 3 x Per Week/30 Days Discharge Instructions: Apply over primary dressing as directed. Compression Wrap: ThreePress (3 layer compression wrap) 3 x Per Week/30 Days Discharge Instructions: Apply three layer compression as directed. Electronic  Signature(s) Signed: 05/15/2021 4:53:46 PM By: Linton Ham MD Signed: 05/15/2021 5:07:48 PM By: Levan Hurst RN, BSN Entered By: Levan Hurst on 05/15/2021 15:31:56 -------------------------------------------------------------------------------- Problem List Details Patient Name: Date of Service: TIMARION, AGCAOILI 05/15/2021 2:00 PM Medical Record Number: [id number] Patient Account Number: [id number] Date of Birth/Sex: Treating RN: 1924-07-01 (87 y.o. 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 05/15/2021 2:00 PM Medical Record Number: [id number] Patient Account Number: [id number] Date of Birth/Sex: Treating RN: 1924-08-06 (87 y.o. [name] Primary Care Provider: [name] Other Clinician: Referring Provider: Treating Provider/Extender: Carley Hammed in Treatment: 6 Subjective History of Present Illness (HPI) ADMISSION 04/02/2021 This is a pleasant 87 year old man sent to our clinic by Dr. Lyndel Safe from the [facility] unit of [facility] [place]. He has a wound on the right posterior calf. I am uncertain about the duration of this however it looks to be chronic. They are using Santyl covered with silver  alginate he does not have any compression. The wound is 100% covered in very fibrinous adherent slough. It also appears that this is quite painful. The patient is exceptionally hard of hearing so it is difficult to get any additional history out of him. Past medical history includes hypertension, coronary artery disease, congestive heart failure, BPH, seizure disorder. He is nonambulatory for reasons that are not totally clear. He lives at the [facility] unit of [facility] [place]. ABI in our clinic was 1.03 on  the right 11/2; 2-week follow-up. He is having the dressing changed at the facility. He arrives in the clinic with the area about the same in terms of surface area but better looking surface we have been using Iodoflex under 3 layer compression. Unfortunately we still do not have any history of exactly how long this is been there or how it happened. 11/16; 2-week follow-up. Nice improvement in the wound. He is using Iodoflex over 3 layer compression. 11/32-week follow-up. [facility] is at the [facility] [place]. We have been using Iodoflex under 3 layer compression. They are not wrapping his leg properly but nevertheless the wound area has come down nicely. The patient requests coming here every 3 weeks instead of every 2 Objective Constitutional Patient is hypertensive.. Pulse regular and within target range for patient.Marland Kitchen Respirations regular, non-labored and within target range.. Temperature is normal and within the target range for the patient.Marland Kitchen Appears in no distress. Vitals Time Taken: 2:47 PM, Height: 68 in, Weight: 170 lbs, BMI: 25.8, Temperature: 97.8 F, Pulse: 71 bpm, Respiratory Rate: 18 breaths/min, Blood Pressure: 182/61 mmHg. Cardiovascular Pedal pulses palpable on the right. Edema control is not well controlled above the level of the mid aspect of his leg. General Notes: Wound exam; posterior right calf. Much better in terms of surface area. Under illumination the  wound has debris on it but it appears to debride off with wound cleanser and gauze fairly easily. No evidence of surrounding infection. The patient has significant venous insufficiency with secondary l lipodermatosclerosis Integumentary (Hair, Skin) Wound #1 status is Open. Original cause of wound was Gradually Appeared. The date acquired was: 03/19/2021. The wound has been in treatment 6 weeks. The wound is located on the Right,Posterior Lower Leg. The wound measures 1.6cm length x 0.6cm width x 0.1cm depth; 0.754cm^2 area and 0.075cm^3 volume. There is Fat Layer (Subcutaneous Tissue) exposed. There is no tunneling or undermining noted. There is a medium amount of serosanguineous drainage noted. The wound margin is flat and intact. There is large (67-100%) pink granulation within the wound bed. There is a small (1-33%) amount of necrotic tissue within the wound bed including Adherent Slough. Assessment Active Problems ICD-10 Chronic venous hypertension (idiopathic) with ulcer and inflammation of right lower extremity Non-pressure chronic ulcer of right calf with other specified severity Procedures Wound #1 Pre-procedure diagnosis of Wound #1 is a Venous Leg Ulcer located on the Right,Posterior Lower Leg . There was a Three Layer Compression Therapy Procedure by Levan Hurst, RN. Post procedure Diagnosis Wound #1: Same as Pre-Procedure Plan Follow-up Appointments: Return appointment in 3 weeks. - with Dr. Arcola Jansky Shower/ Hygiene: May shower with protection but do not get wound dressing(s) wet. - Ok to use cast protector, can also remove wrap prior to dressing change and wash wound with soap and water on bath days. Edema Control - Lymphedema / SCD / Other: Elevate legs to the level of the heart or above for 30 minutes daily and/or when sitting, a frequency of: - throughout the day Exercise regularly WOUND #1: - Lower Leg Wound Laterality: Right, Posterior Cleanser: Soap and Water  3 x Per Week/30 Days Discharge Instructions: May shower and wash wound with dial antibacterial soap and water prior to dressing change. Cleanser: Wound Cleanser 3 x Per Week/30 Days Discharge Instructions: Cleanse the wound with wound cleanser prior to applying a clean dressing using gauze sponges, not tissue or cotton balls. Peri-Wound Care: Sween Lotion (Moisturizing lotion) 3 x Per Week/30 Days  Discharge Instructions: Apply moisturizing lotion to lower leg with dressing changes Prim Dressing: IODOFLEX 0.9% Cadexomer Iodine Pad 4x6 cm 3 x Per Week/30 Days ary Discharge Instructions: or iodosorb gel Apply to wound bed as instructed Secondary Dressing: Woven Gauze Sponge, Non-Sterile 4x4 in 3 x Per Week/30 Days Discharge Instructions: Apply over primary dressing as directed. Com pression Wrap: ThreePress (3 layer compression wrap) 3 x Per Week/30 Days Discharge Instructions: Apply three layer compression as directed. #1 I have continued with the Iodoflex under 3 layer compression 2. We have tried to get the facility to wrap this man's leg properly which would be up to just below the tibial tuberosity. The problem is that there is pitting edema above the lower part of his leg and lipodermatosclerosis in the lower one third. If we do not control the swelling here the wounds will reopen. 3. The patient is exceptionally hard of hearing and seems fairly insistent about the 3-week follow-up. I told him that if there is a problem I will not necessarily hear about the Electronic Signature(s) Signed: 05/15/2021 4:53:46 PM By: Linton Ham MD Entered By: Linton Ham on 05/15/2021 15:47:17 -------------------------------------------------------------------------------- SuperBill Details Patient Name: Date of Service: [name] 05/15/2021 Medical Record Number: [id number] Patient Account Number: [id number] Date of Birth/Sex: Treating RN: May 26, 1925 (87 y.o. 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 Facility Procedures CPT4 Code: [id number] Description: (Facility Use Only) [phone] - [place] COMPRS LWR RT LEG Modifier: Quantity: 1 Physician Procedures : CPT4 Code Description Modifier [id number] - WC PHYS LEVEL 3 - EST PT ICD-10 Diagnosis Description I87.331 Chronic venous hypertension (idiopathic) with ulcer and inflammation of right lower extremity L97.218 Non-pressure chronic ulcer of right calf  with other specified severity Quantity: 1 Electronic Signature(s) Signed: 05/15/2021 5:07:48 PM By: Levan Hurst RN, BSN Signed: 05/16/2021 4:34:24 PM By: Linton Ham MD Previous Signature: 05/15/2021 4:53:46 PM Version By: Linton Ham MD Entered By: Levan Hurst on 05/15/2021 17:02:22

## 2021-05-17 DIAGNOSIS — M150 Primary generalized (osteo)arthritis: Secondary | ICD-10-CM | POA: Diagnosis not present

## 2021-05-17 DIAGNOSIS — M25662 Stiffness of left knee, not elsewhere classified: Secondary | ICD-10-CM | POA: Diagnosis not present

## 2021-05-17 DIAGNOSIS — W19XXXA Unspecified fall, initial encounter: Secondary | ICD-10-CM | POA: Diagnosis not present

## 2021-05-17 DIAGNOSIS — M6281 Muscle weakness (generalized): Secondary | ICD-10-CM | POA: Diagnosis not present

## 2021-05-17 DIAGNOSIS — M25652 Stiffness of left hip, not elsewhere classified: Secondary | ICD-10-CM | POA: Diagnosis not present

## 2021-05-17 DIAGNOSIS — M25651 Stiffness of right hip, not elsewhere classified: Secondary | ICD-10-CM | POA: Diagnosis not present

## 2021-05-17 DIAGNOSIS — M25661 Stiffness of right knee, not elsewhere classified: Secondary | ICD-10-CM | POA: Diagnosis not present

## 2021-05-17 DIAGNOSIS — R2681 Unsteadiness on feet: Secondary | ICD-10-CM | POA: Diagnosis not present

## 2021-05-17 DIAGNOSIS — R278 Other lack of coordination: Secondary | ICD-10-CM | POA: Diagnosis not present

## 2021-05-21 ENCOUNTER — Ambulatory Visit: Payer: Medicare HMO | Admitting: Adult Health

## 2021-05-21 DIAGNOSIS — R2681 Unsteadiness on feet: Secondary | ICD-10-CM | POA: Diagnosis not present

## 2021-05-21 DIAGNOSIS — M6281 Muscle weakness (generalized): Secondary | ICD-10-CM | POA: Diagnosis not present

## 2021-05-21 DIAGNOSIS — M25662 Stiffness of left knee, not elsewhere classified: Secondary | ICD-10-CM | POA: Diagnosis not present

## 2021-05-21 DIAGNOSIS — W19XXXA Unspecified fall, initial encounter: Secondary | ICD-10-CM | POA: Diagnosis not present

## 2021-05-21 DIAGNOSIS — M25652 Stiffness of left hip, not elsewhere classified: Secondary | ICD-10-CM | POA: Diagnosis not present

## 2021-05-21 DIAGNOSIS — M150 Primary generalized (osteo)arthritis: Secondary | ICD-10-CM | POA: Diagnosis not present

## 2021-05-21 DIAGNOSIS — R278 Other lack of coordination: Secondary | ICD-10-CM | POA: Diagnosis not present

## 2021-05-21 DIAGNOSIS — M25661 Stiffness of right knee, not elsewhere classified: Secondary | ICD-10-CM | POA: Diagnosis not present

## 2021-05-21 DIAGNOSIS — M25651 Stiffness of right hip, not elsewhere classified: Secondary | ICD-10-CM | POA: Diagnosis not present

## 2021-05-22 DIAGNOSIS — M25662 Stiffness of left knee, not elsewhere classified: Secondary | ICD-10-CM | POA: Diagnosis not present

## 2021-05-22 DIAGNOSIS — R278 Other lack of coordination: Secondary | ICD-10-CM | POA: Diagnosis not present

## 2021-05-22 DIAGNOSIS — M150 Primary generalized (osteo)arthritis: Secondary | ICD-10-CM | POA: Diagnosis not present

## 2021-05-22 DIAGNOSIS — M25661 Stiffness of right knee, not elsewhere classified: Secondary | ICD-10-CM | POA: Diagnosis not present

## 2021-05-22 DIAGNOSIS — W19XXXA Unspecified fall, initial encounter: Secondary | ICD-10-CM | POA: Diagnosis not present

## 2021-05-22 DIAGNOSIS — M25652 Stiffness of left hip, not elsewhere classified: Secondary | ICD-10-CM | POA: Diagnosis not present

## 2021-05-22 DIAGNOSIS — M25651 Stiffness of right hip, not elsewhere classified: Secondary | ICD-10-CM | POA: Diagnosis not present

## 2021-05-22 DIAGNOSIS — R2681 Unsteadiness on feet: Secondary | ICD-10-CM | POA: Diagnosis not present

## 2021-05-22 DIAGNOSIS — M6281 Muscle weakness (generalized): Secondary | ICD-10-CM | POA: Diagnosis not present

## 2021-05-24 DIAGNOSIS — M25662 Stiffness of left knee, not elsewhere classified: Secondary | ICD-10-CM | POA: Diagnosis not present

## 2021-05-24 DIAGNOSIS — M150 Primary generalized (osteo)arthritis: Secondary | ICD-10-CM | POA: Diagnosis not present

## 2021-05-24 DIAGNOSIS — R278 Other lack of coordination: Secondary | ICD-10-CM | POA: Diagnosis not present

## 2021-05-24 DIAGNOSIS — M25652 Stiffness of left hip, not elsewhere classified: Secondary | ICD-10-CM | POA: Diagnosis not present

## 2021-05-24 DIAGNOSIS — R2681 Unsteadiness on feet: Secondary | ICD-10-CM | POA: Diagnosis not present

## 2021-05-24 DIAGNOSIS — M25661 Stiffness of right knee, not elsewhere classified: Secondary | ICD-10-CM | POA: Diagnosis not present

## 2021-05-24 DIAGNOSIS — M25651 Stiffness of right hip, not elsewhere classified: Secondary | ICD-10-CM | POA: Diagnosis not present

## 2021-05-24 DIAGNOSIS — W19XXXA Unspecified fall, initial encounter: Secondary | ICD-10-CM | POA: Diagnosis not present

## 2021-05-24 DIAGNOSIS — M6281 Muscle weakness (generalized): Secondary | ICD-10-CM | POA: Diagnosis not present

## 2021-05-27 DIAGNOSIS — M150 Primary generalized (osteo)arthritis: Secondary | ICD-10-CM | POA: Diagnosis not present

## 2021-05-27 DIAGNOSIS — R278 Other lack of coordination: Secondary | ICD-10-CM | POA: Diagnosis not present

## 2021-05-27 DIAGNOSIS — M25662 Stiffness of left knee, not elsewhere classified: Secondary | ICD-10-CM | POA: Diagnosis not present

## 2021-05-27 DIAGNOSIS — M25652 Stiffness of left hip, not elsewhere classified: Secondary | ICD-10-CM | POA: Diagnosis not present

## 2021-05-27 DIAGNOSIS — M6281 Muscle weakness (generalized): Secondary | ICD-10-CM | POA: Diagnosis not present

## 2021-05-27 DIAGNOSIS — M25661 Stiffness of right knee, not elsewhere classified: Secondary | ICD-10-CM | POA: Diagnosis not present

## 2021-05-27 DIAGNOSIS — R2681 Unsteadiness on feet: Secondary | ICD-10-CM | POA: Diagnosis not present

## 2021-05-27 DIAGNOSIS — W19XXXA Unspecified fall, initial encounter: Secondary | ICD-10-CM | POA: Diagnosis not present

## 2021-05-27 DIAGNOSIS — M25651 Stiffness of right hip, not elsewhere classified: Secondary | ICD-10-CM | POA: Diagnosis not present

## 2021-05-29 DIAGNOSIS — M25651 Stiffness of right hip, not elsewhere classified: Secondary | ICD-10-CM | POA: Diagnosis not present

## 2021-05-29 DIAGNOSIS — M150 Primary generalized (osteo)arthritis: Secondary | ICD-10-CM | POA: Diagnosis not present

## 2021-05-29 DIAGNOSIS — M25661 Stiffness of right knee, not elsewhere classified: Secondary | ICD-10-CM | POA: Diagnosis not present

## 2021-05-29 DIAGNOSIS — M25652 Stiffness of left hip, not elsewhere classified: Secondary | ICD-10-CM | POA: Diagnosis not present

## 2021-05-29 DIAGNOSIS — M25662 Stiffness of left knee, not elsewhere classified: Secondary | ICD-10-CM | POA: Diagnosis not present

## 2021-05-29 DIAGNOSIS — R278 Other lack of coordination: Secondary | ICD-10-CM | POA: Diagnosis not present

## 2021-05-29 DIAGNOSIS — M6281 Muscle weakness (generalized): Secondary | ICD-10-CM | POA: Diagnosis not present

## 2021-05-29 DIAGNOSIS — W19XXXA Unspecified fall, initial encounter: Secondary | ICD-10-CM | POA: Diagnosis not present

## 2021-05-29 DIAGNOSIS — R2681 Unsteadiness on feet: Secondary | ICD-10-CM | POA: Diagnosis not present

## 2021-06-03 DIAGNOSIS — R2681 Unsteadiness on feet: Secondary | ICD-10-CM | POA: Diagnosis not present

## 2021-06-03 DIAGNOSIS — M25652 Stiffness of left hip, not elsewhere classified: Secondary | ICD-10-CM | POA: Diagnosis not present

## 2021-06-03 DIAGNOSIS — R278 Other lack of coordination: Secondary | ICD-10-CM | POA: Diagnosis not present

## 2021-06-03 DIAGNOSIS — M25661 Stiffness of right knee, not elsewhere classified: Secondary | ICD-10-CM | POA: Diagnosis not present

## 2021-06-03 DIAGNOSIS — M25662 Stiffness of left knee, not elsewhere classified: Secondary | ICD-10-CM | POA: Diagnosis not present

## 2021-06-03 DIAGNOSIS — M6281 Muscle weakness (generalized): Secondary | ICD-10-CM | POA: Diagnosis not present

## 2021-06-03 DIAGNOSIS — M150 Primary generalized (osteo)arthritis: Secondary | ICD-10-CM | POA: Diagnosis not present

## 2021-06-03 DIAGNOSIS — M25651 Stiffness of right hip, not elsewhere classified: Secondary | ICD-10-CM | POA: Diagnosis not present

## 2021-06-03 DIAGNOSIS — W19XXXA Unspecified fall, initial encounter: Secondary | ICD-10-CM | POA: Diagnosis not present

## 2021-06-04 DIAGNOSIS — M25661 Stiffness of right knee, not elsewhere classified: Secondary | ICD-10-CM | POA: Diagnosis not present

## 2021-06-04 DIAGNOSIS — M25662 Stiffness of left knee, not elsewhere classified: Secondary | ICD-10-CM | POA: Diagnosis not present

## 2021-06-04 DIAGNOSIS — R278 Other lack of coordination: Secondary | ICD-10-CM | POA: Diagnosis not present

## 2021-06-04 DIAGNOSIS — M150 Primary generalized (osteo)arthritis: Secondary | ICD-10-CM | POA: Diagnosis not present

## 2021-06-04 DIAGNOSIS — W19XXXA Unspecified fall, initial encounter: Secondary | ICD-10-CM | POA: Diagnosis not present

## 2021-06-04 DIAGNOSIS — M6281 Muscle weakness (generalized): Secondary | ICD-10-CM | POA: Diagnosis not present

## 2021-06-04 DIAGNOSIS — M25652 Stiffness of left hip, not elsewhere classified: Secondary | ICD-10-CM | POA: Diagnosis not present

## 2021-06-04 DIAGNOSIS — M25651 Stiffness of right hip, not elsewhere classified: Secondary | ICD-10-CM | POA: Diagnosis not present

## 2021-06-04 DIAGNOSIS — R2681 Unsteadiness on feet: Secondary | ICD-10-CM | POA: Diagnosis not present

## 2021-06-05 ENCOUNTER — Other Ambulatory Visit: Payer: Self-pay

## 2021-06-05 ENCOUNTER — Encounter (HOSPITAL_BASED_OUTPATIENT_CLINIC_OR_DEPARTMENT_OTHER): Payer: Medicare HMO | Attending: Internal Medicine | Admitting: Internal Medicine

## 2021-06-05 DIAGNOSIS — L97218 Non-pressure chronic ulcer of right calf with other specified severity: Secondary | ICD-10-CM | POA: Diagnosis not present

## 2021-06-05 DIAGNOSIS — M150 Primary generalized (osteo)arthritis: Secondary | ICD-10-CM | POA: Diagnosis not present

## 2021-06-05 DIAGNOSIS — W19XXXA Unspecified fall, initial encounter: Secondary | ICD-10-CM | POA: Diagnosis not present

## 2021-06-05 DIAGNOSIS — I2510 Atherosclerotic heart disease of native coronary artery without angina pectoris: Secondary | ICD-10-CM | POA: Diagnosis not present

## 2021-06-05 DIAGNOSIS — I110 Hypertensive heart disease with heart failure: Secondary | ICD-10-CM | POA: Diagnosis not present

## 2021-06-05 DIAGNOSIS — I87331 Chronic venous hypertension (idiopathic) with ulcer and inflammation of right lower extremity: Secondary | ICD-10-CM | POA: Diagnosis not present

## 2021-06-05 DIAGNOSIS — I872 Venous insufficiency (chronic) (peripheral): Secondary | ICD-10-CM | POA: Insufficient documentation

## 2021-06-05 DIAGNOSIS — M6281 Muscle weakness (generalized): Secondary | ICD-10-CM | POA: Diagnosis not present

## 2021-06-05 DIAGNOSIS — G40909 Epilepsy, unspecified, not intractable, without status epilepticus: Secondary | ICD-10-CM | POA: Diagnosis not present

## 2021-06-05 DIAGNOSIS — M25662 Stiffness of left knee, not elsewhere classified: Secondary | ICD-10-CM | POA: Diagnosis not present

## 2021-06-05 DIAGNOSIS — R278 Other lack of coordination: Secondary | ICD-10-CM | POA: Diagnosis not present

## 2021-06-05 DIAGNOSIS — M25652 Stiffness of left hip, not elsewhere classified: Secondary | ICD-10-CM | POA: Diagnosis not present

## 2021-06-05 DIAGNOSIS — H9190 Unspecified hearing loss, unspecified ear: Secondary | ICD-10-CM | POA: Insufficient documentation

## 2021-06-05 DIAGNOSIS — N400 Enlarged prostate without lower urinary tract symptoms: Secondary | ICD-10-CM | POA: Diagnosis not present

## 2021-06-05 DIAGNOSIS — I509 Heart failure, unspecified: Secondary | ICD-10-CM | POA: Insufficient documentation

## 2021-06-05 DIAGNOSIS — M25661 Stiffness of right knee, not elsewhere classified: Secondary | ICD-10-CM | POA: Diagnosis not present

## 2021-06-05 DIAGNOSIS — M25651 Stiffness of right hip, not elsewhere classified: Secondary | ICD-10-CM | POA: Diagnosis not present

## 2021-06-05 DIAGNOSIS — L97212 Non-pressure chronic ulcer of right calf with fat layer exposed: Secondary | ICD-10-CM | POA: Diagnosis not present

## 2021-06-05 DIAGNOSIS — R2681 Unsteadiness on feet: Secondary | ICD-10-CM | POA: Diagnosis not present

## 2021-06-06 ENCOUNTER — Encounter: Payer: Self-pay | Admitting: Nurse Practitioner

## 2021-06-06 ENCOUNTER — Non-Acute Institutional Stay (SKILLED_NURSING_FACILITY): Payer: Medicare HMO | Admitting: Nurse Practitioner

## 2021-06-06 DIAGNOSIS — I10 Essential (primary) hypertension: Secondary | ICD-10-CM

## 2021-06-06 DIAGNOSIS — R296 Repeated falls: Secondary | ICD-10-CM

## 2021-06-06 DIAGNOSIS — I2510 Atherosclerotic heart disease of native coronary artery without angina pectoris: Secondary | ICD-10-CM | POA: Diagnosis not present

## 2021-06-06 DIAGNOSIS — N401 Enlarged prostate with lower urinary tract symptoms: Secondary | ICD-10-CM | POA: Diagnosis not present

## 2021-06-06 DIAGNOSIS — I872 Venous insufficiency (chronic) (peripheral): Secondary | ICD-10-CM | POA: Diagnosis not present

## 2021-06-06 DIAGNOSIS — I5032 Chronic diastolic (congestive) heart failure: Secondary | ICD-10-CM

## 2021-06-06 DIAGNOSIS — M25551 Pain in right hip: Secondary | ICD-10-CM

## 2021-06-06 DIAGNOSIS — Z8673 Personal history of transient ischemic attack (TIA), and cerebral infarction without residual deficits: Secondary | ICD-10-CM

## 2021-06-06 DIAGNOSIS — K219 Gastro-esophageal reflux disease without esophagitis: Secondary | ICD-10-CM | POA: Diagnosis not present

## 2021-06-06 DIAGNOSIS — I4891 Unspecified atrial fibrillation: Secondary | ICD-10-CM | POA: Diagnosis not present

## 2021-06-06 DIAGNOSIS — R569 Unspecified convulsions: Secondary | ICD-10-CM | POA: Diagnosis not present

## 2021-06-06 DIAGNOSIS — R69 Illness, unspecified: Secondary | ICD-10-CM | POA: Diagnosis not present

## 2021-06-06 DIAGNOSIS — N138 Other obstructive and reflux uropathy: Secondary | ICD-10-CM

## 2021-06-06 DIAGNOSIS — F5101 Primary insomnia: Secondary | ICD-10-CM

## 2021-06-06 NOTE — Assessment & Plan Note (Signed)
stable, off Trazodone.  

## 2021-06-06 NOTE — Assessment & Plan Note (Signed)
EF 65-70%, grade 1 diastolic dysfunction, on Furosemide, saw Cardiology 

## 2021-06-06 NOTE — Progress Notes (Signed)
Location:   SNF [place] Room Number: 39 Place of Service:  SNF (31) Provider: [facility] Shunsuke Granzow NP  [name], MD  Patient Care Team: [name], MD as PCP - General (Internal Medicine) Gatha Mayer, MD as Consulting Physician (Gastroenterology) [name], [name], MD as Consulting Physician (Cardiology) [name], MD as Attending Physician (Internal Medicine) Hamzeh Tall X, NP as Nurse Practitioner (Internal Medicine)  Extended Emergency Contact Information Primary Emergency Contact: Torry,[name] Address: [place], [place] [address] [name] of [place] Phone: [phone] Mobile Phone: [phone] Relation: Daughter Secondary Emergency Contact: Vernier,[name] Address: [place] [address] [name] of [place] Phone: [phone] Mobile Phone: [phone] Relation: Son  Code Status:  DNR Goals of care: Advanced Directive information Advanced Directives 05/03/2021  Does Patient Have a Medical Advance Directive? Yes  Type of Paramedic of [place];Living will  Does patient want to make changes to medical advance directive? No - Patient declined  Copy of [place] in Chart? Yes - validated most recent copy scanned in chart (See row information)  Pre-existing out of facility DNR order (yellow form or pink MOST form) -     Chief Complaint  Patient presents with   Medical Management of Chronic Issues    HPI:  Pt is a 87 y.o. male seen today for medical management of chronic diseases.     TIA, resolved right hand numbness, weakness, difficulty feeding self, changed to ASA 325mg  qd from 81mg  11/13/20             Recurrent falls, due to unsteady on his legs, attempting standing up or transfer with no assistance.              BPH, saw urology. Chronic cystitis              PVD, BLE brownish erythematous pigmentation, swelling, RLE venous  ulcer is healed.              Afib, onset 09/2019, heart rate is conrolled, not a candidate for anticoagulation             CHF, EF 22-63%, grade 1 diastolic dysfunction, on Furosemide, saw Cardiology             HTN, blood pressure is controled on amlodipine, Lisinopril. Bun/creat 14/0.8 02/05/21             Hx of CAD, hyperlipidemia, on Crestor, ASA. LDL 50 02/05/21.  10/09/20 EKG SR vent rate 65, no significant ST-T changes.              GERD, takes Omeprazole. Hgb 14.2 11/14/20             Insomnia, stable, off Trazodone.              Chronic lower back pain/right hip pain,  on Gabapentin 200mg  qd             OA, s/p left hip surgery, chronic right hip pain, w/c helps, prn Tylenol             Seizure: takes Keppra 500mg  bid. F/u neurology    Past Medical History:  Diagnosis Date   Allergic rhinitis    Amnesia    Atherosclerotic heart disease of native coronary artery without angina pectoris    BPH (benign prostatic hyperplasia)  Bradycardia    Chronic diastolic (congestive) heart failure (HCC)    Colon polyps    Coronary artery disease    MODERATE   Dizziness    Edema of lower extremity    Erectile dysfunction    H. pylori infection    Hx of    Hard of hearing    Headache(784.0)    History of colon polyps 2009   Dr. Orr/Colonoscopy -small cecal adenoma   History of falling    Hx of colonoscopy 2009   Dr Lajoyce Corners, hemorrhoids & polyps   Hyperlipidemia    Hypertension    Hypertensive heart disease with heart failure [facility])    Hypokalemia    Internal hemorrhoids 2009   Dr. Lajoyce Corners Colonoscopy   Metabolic encephalopathy    Mitral valve regurgitation    Muscle weakness (generalized)    Pneumonia    Primary generalized (osteo)arthritis    Reflux esophagitis    Sepsis ([place]) 11/04/2018   Unspecified hemorrhoids    Unsteadiness on feet    Vitamin D deficiency    Past Surgical History:  Procedure Laterality Date   APPENDECTOMY     CARDIAC CATHETERIZATION  1997   REVEALED MILD TO  MODERATE IRREGULARITIES. HIS LEFT EVNTRICULAR SYSTOLIC FUNCTION REVEALED NORMAL EJECTION FRACTION WITH EF OF 70%   CATARACT EXTRACTION, BILATERAL     COLONOSCOPY  multiple   fatty tumor removal     benign   HEMORROIDECTOMY     HIP ARTHROPLASTY Left 08/17/2013   Procedure: ARTHROPLASTY BIPOLAR HIP;  Surgeon: Gearlean Alf, MD;  Location: [facility];  Service: Orthopedics;  Laterality: Left;   INGUINAL HERNIA REPAIR Bilateral 2006   lower back surgery  2006   NASAL SEPTUM SURGERY     TONSILLECTOMY     UPPER GASTROINTESTINAL ENDOSCOPY      No Known Allergies  Allergies as of 06/06/2021   No Known Allergies      Medication List        Accurate as of June 06, 2021 11:59 PM. If you have any questions, ask your nurse or doctor.          acetaminophen 500 MG tablet Commonly known as: TYLENOL Take 500 mg by mouth every 8 (eight) hours as needed.   amLODipine 5 MG tablet Commonly known as: NORVASC Take 5 mg by mouth daily.   aspirin 325 MG tablet Take 325 mg by mouth daily.   cadexomer iodine 0.9 % gel Commonly known as: IODOSORB Apply 1 application topically 3 (three) times a week. Once A Day on Mon, Wed, Fri   DEXTRAN 70-HYPROMELLOSE OP Apply 1 drop to eye 3 (three) times daily as needed. Special Instructions: as needed for dry eyes/itching   furosemide 40 MG tablet Commonly known as: LASIX Take 40 mg by mouth daily.   hydrocortisone 2.5 % rectal cream Commonly known as: ANUSOL-HC Place 1 application rectally 2 (two) times daily as needed for anal itching or hemorrhoids (USE PERINEAL APPLICATOR). With perineal applicator   levETIRAcetam 500 MG tablet Commonly known as: KEPPRA Take 1 tablet (500 mg total) by mouth 2 (two) times daily.   lisinopril 40 MG tablet Commonly known as: ZESTRIL Take 40 mg by mouth daily.   METHOCARBAMOL PO Take 250 mg by mouth as needed. Take 250 mg by mouth in the morning and an additional 250 mg once a day as needed for muscle  spasms   omeprazole 20 MG capsule Commonly known as: PRILOSEC Take 20 mg by mouth daily.  potassium chloride SA 20 MEQ tablet Commonly known as: KLOR-CON M Take 20 mEq by mouth daily.   rosuvastatin 5 MG tablet Commonly known as: CRESTOR Take 5 mg by mouth daily.   Thera-M Tabs Take 1 tablet by mouth daily.   vitamin B-12 1000 MCG tablet Commonly known as: CYANOCOBALAMIN Take 1,000 mcg by mouth daily.        Review of Systems  Constitutional:  Negative for fatigue, fever and unexpected weight change.  HENT:  Positive for hearing loss. Negative for congestion and voice change.   Eyes:  Negative for visual disturbance.  Respiratory:  Negative for cough, shortness of breath and wheezing.   Cardiovascular:  Positive for leg swelling.  Gastrointestinal:  Negative for abdominal pain and constipation.  Genitourinary:  Positive for frequency. Negative for difficulty urinating and urgency.  Musculoskeletal:  Positive for arthralgias, back pain and gait problem.       Lower back, right hip/thigh pain   Skin:  Negative for color change.       Chronic venous insufficiency skin changes BLE.  Neurological:  Negative for seizures, weakness and headaches.       Memory lapses. Tingling in toes. Right hand weakness/numbness is improved, placed ASA 325 11/13/20  Psychiatric/Behavioral:  Negative for behavioral problems and sleep disturbance. The patient is not nervous/anxious.    Immunization History  Administered Date(s) Administered   Influenza, High Dose Seasonal PF 03/19/2019, 03/19/2019   Influenza-Unspecified 04/08/2016, 03/27/2020, 04/03/2021   Moderna Sars-Covid-2 Vaccination 06/18/2019, 07/16/2019, 04/24/2020, 11/13/2020   Pfizer Covid-19 Vaccine Bivalent Booster 58yrs & up 03/05/2021   Pneumococcal Conjugate-13 06/02/2013   Pneumococcal Polysaccharide-23 04/07/2001   Tetanus 08/14/2013   Pertinent  Health Maintenance Due  Topic Date Due   INFLUENZA VACCINE  Completed    Fall Risk 09/21/2019 09/22/2019 09/22/2019 09/23/2019 01/12/2020  Falls in the past year? - - - - -  Was there an injury with Fall? - - - - -  Was there an injury with Fall? - - - - -  Patient Fall Risk Level High fall risk High fall risk High fall risk High fall risk High fall risk  Fall risk Follow up - - - - -   Functional Status Survey:    Vitals:   06/06/21 1142  BP: 130/74  Pulse: 60  Resp: 18  Temp: 98 F (36.7 C)  SpO2: 93%   There is no height or weight on file to calculate BMI. Physical Exam Vitals and nursing note reviewed.  Constitutional:      Appearance: Normal appearance.  HENT:     Head: Normocephalic and atraumatic.     Mouth/Throat:     Mouth: Mucous membranes are moist.  Eyes:     Conjunctiva/sclera: Conjunctivae normal.     Pupils: Pupils are equal, round, and reactive to light.  Cardiovascular:     Rate and Rhythm: Normal rate and regular rhythm.     Heart sounds: Murmur heard.  Pulmonary:     Breath sounds: No rales.  Abdominal:     General: Bowel sounds are normal.     Palpations: Abdomen is soft.     Tenderness: There is no abdominal tenderness.  Genitourinary:    Comments: External hemorrhoids, no bleeding or injury.  Musculoskeletal:     Cervical back: Normal range of motion and neck supple. No muscular tenderness.     Right lower leg: Edema present.     Left lower leg: Edema present.     Comments: 1+  edema BLE. W/c for mobility.   Skin:    General: Skin is warm and dry.     Findings: Erythema present.     Comments: Chronic venous insufficiency skin changes BLE, erythematous is mild.   Neurological:     General: No focal deficit present.     Mental Status: He is alert. Mental status is at baseline.     Motor: No weakness.     Coordination: Coordination normal.     Gait: Gait abnormal.     Comments: Oriented to person, place.   Psychiatric:        Mood and Affect: Mood normal.        Behavior: Behavior normal.        Thought Content:  Thought content normal.    Labs reviewed: Recent Labs    10/09/20 1607 11/14/20 0000 02/05/21 0000  NA 139 137 137  K 4.6 4.3 4.0  CL 104 102 102  CO2 28* 28* 28*  BUN 15 17 14   CREATININE 1.1 0.9 0.8  CALCIUM 8.8 9.2 8.7   Recent Labs    10/09/20 1607 11/14/20 0000  AST 16 18  ALT 14 20  ALKPHOS 54 63  ALBUMIN 3.4* 3.9   Recent Labs    09/25/20 0000 10/09/20 1607 11/14/20 0000  WBC 7.9 8.9 11.5  NEUTROABS  --  5,963.00 7,889.00  HGB 14.1 13.5 14.2  HCT 42 39* 43  PLT 218 236 231   Lab Results  Component Value Date   TSH 1.77 04/06/2020   No results found for: HGBA1C Lab Results  Component Value Date   CHOL 109 02/05/2021   HDL 45 02/05/2021   LDLCALC 50 02/05/2021   TRIG 63 02/05/2021   CHOLHDL 2.7 02/17/2017    Significant Diagnostic Results in last 30 days:  No results found.  Assessment/Plan  BPH (benign prostatic hyperplasia) saw urology, 04/25/21 Dr. Rosana Hoes III RTC one year Chronic cystitis  Venous insufficiency (chronic) (peripheral) BLE brownish erythematous pigmentation, swelling, RLE venous ulcer is healed.   New onset atrial fibrillation ([place]) onset 09/2019, heart rate is conrolled, not a candidate for anticoagulation  Chronic diastolic heart failure (HCC) EF 57-32%, grade 1 diastolic dysfunction, on Furosemide, saw Cardiology  Hypertension blood pressure is controled on amlodipine, Lisinopril. Bun/creat 14/0.8 02/05/21  CAD (coronary artery disease) Hx of CAD, hyperlipidemia, on Crestor, ASA. LDL 50 02/05/21.  10/09/20 EKG SR vent rate 65, no significant ST-T changes.   GERD (gastroesophageal reflux disease) takes Omeprazole. Hgb 14.2 11/14/20  Insomnia stable, off Trazodone.   Right hip pain Chronic lower back pain/right hip pain, s/p left hip surgery,  on Gabapentin 200mg  qd, uses w/c for mobility.   Seizure ([place]) Stable,  takes Keppra 500mg  bid. F/u neurology  Recurrent falls due to unsteady on his legs, attempting  standing up or transfer with no assistance.   History of TIA (transient ischemic attack) resolved right hand numbness, weakness, difficulty feeding self, changed to ASA 325mg  qd from 81mg  11/13/20   Family/ staff Communication: plan of care reviewed with the patient and charge nurse  Labs/tests ordered: none  Time spend 35 minutes

## 2021-06-06 NOTE — Assessment & Plan Note (Signed)
Chronic lower back pain/right hip pain, s/p left hip surgery,  on Gabapentin 200mg  qd, uses w/c for mobility.

## 2021-06-06 NOTE — Assessment & Plan Note (Signed)
due to unsteady on his legs, attempting standing up or transfer with no assistance.  

## 2021-06-06 NOTE — Assessment & Plan Note (Signed)
takes Omeprazole. Hgb 14.2 11/14/20

## 2021-06-06 NOTE — Assessment & Plan Note (Signed)
onset 09/2019, heart rate is conrolled, not a candidate for anticoagulation

## 2021-06-06 NOTE — Assessment & Plan Note (Addendum)
saw urology, 04/25/21 Dr. Rosana Hoes III RTC one year Chronic cystitis

## 2021-06-06 NOTE — Assessment & Plan Note (Signed)
resolved right hand numbness, weakness, difficulty feeding self, changed to ASA 325mg  qd from 81mg  11/13/20

## 2021-06-06 NOTE — Assessment & Plan Note (Signed)
Stable,  takes Keppra 500mg  bid. F/u neurology

## 2021-06-06 NOTE — Assessment & Plan Note (Signed)
BLE brownish erythematous pigmentation, swelling, RLE venous ulcer is healed.

## 2021-06-06 NOTE — Assessment & Plan Note (Signed)
blood pressure is controled on amlodipine, Lisinopril. Bun/creat 14/0.8 02/05/21

## 2021-06-06 NOTE — Assessment & Plan Note (Signed)
Hx of CAD, hyperlipidemia, on Crestor, ASA. LDL 50 02/05/21.  10/09/20 EKG SR vent rate 65, no significant ST-T changes.

## 2021-06-07 ENCOUNTER — Encounter: Payer: Self-pay | Admitting: Nurse Practitioner

## 2021-06-07 DIAGNOSIS — M25652 Stiffness of left hip, not elsewhere classified: Secondary | ICD-10-CM | POA: Diagnosis not present

## 2021-06-07 DIAGNOSIS — M25651 Stiffness of right hip, not elsewhere classified: Secondary | ICD-10-CM | POA: Diagnosis not present

## 2021-06-07 DIAGNOSIS — M6281 Muscle weakness (generalized): Secondary | ICD-10-CM | POA: Diagnosis not present

## 2021-06-07 DIAGNOSIS — W19XXXA Unspecified fall, initial encounter: Secondary | ICD-10-CM | POA: Diagnosis not present

## 2021-06-07 DIAGNOSIS — R278 Other lack of coordination: Secondary | ICD-10-CM | POA: Diagnosis not present

## 2021-06-07 DIAGNOSIS — M25662 Stiffness of left knee, not elsewhere classified: Secondary | ICD-10-CM | POA: Diagnosis not present

## 2021-06-07 DIAGNOSIS — M25661 Stiffness of right knee, not elsewhere classified: Secondary | ICD-10-CM | POA: Diagnosis not present

## 2021-06-07 DIAGNOSIS — M150 Primary generalized (osteo)arthritis: Secondary | ICD-10-CM | POA: Diagnosis not present

## 2021-06-07 DIAGNOSIS — R2681 Unsteadiness on feet: Secondary | ICD-10-CM | POA: Diagnosis not present

## 2021-06-10 DIAGNOSIS — R278 Other lack of coordination: Secondary | ICD-10-CM | POA: Diagnosis not present

## 2021-06-10 DIAGNOSIS — M6281 Muscle weakness (generalized): Secondary | ICD-10-CM | POA: Diagnosis not present

## 2021-06-10 DIAGNOSIS — M25651 Stiffness of right hip, not elsewhere classified: Secondary | ICD-10-CM | POA: Diagnosis not present

## 2021-06-10 DIAGNOSIS — M25652 Stiffness of left hip, not elsewhere classified: Secondary | ICD-10-CM | POA: Diagnosis not present

## 2021-06-10 DIAGNOSIS — W19XXXA Unspecified fall, initial encounter: Secondary | ICD-10-CM | POA: Diagnosis not present

## 2021-06-10 DIAGNOSIS — R2681 Unsteadiness on feet: Secondary | ICD-10-CM | POA: Diagnosis not present

## 2021-06-10 DIAGNOSIS — M150 Primary generalized (osteo)arthritis: Secondary | ICD-10-CM | POA: Diagnosis not present

## 2021-06-10 DIAGNOSIS — M25661 Stiffness of right knee, not elsewhere classified: Secondary | ICD-10-CM | POA: Diagnosis not present

## 2021-06-10 DIAGNOSIS — M25662 Stiffness of left knee, not elsewhere classified: Secondary | ICD-10-CM | POA: Diagnosis not present

## 2021-06-11 NOTE — Progress Notes (Signed)
[name], [name] ([id number]) Visit Report for 06/05/2021 Arrival Information Details Patient Name: Date of Service: GARWOOD, [name] [name] 06/05/2021 2:30 PM Medical Record Number: [id number] Patient Account Number: [id number] Date of Birth/Sex: Treating RN: 01-25-1925 (87 y.o. [name], [name] Primary Care Ameena Vesey: [name] Other Clinician: Referring Remington Skalsky: Treating Anastasya Jewell/Extender: Carley Hammed in Treatment: 9 Visit Information History Since Last Visit Added or deleted any medications: No Patient Arrived: Wheel Chair Any new allergies or adverse reactions: No Arrival Time: 14:59 Had a fall or experienced change in No Accompanied By: self activities of daily living that may affect Transfer Assistance: None risk of falls: Patient Identification Verified: Yes Signs or symptoms of abuse/neglect since last visito No Secondary Verification Process Completed: Yes Hospitalized since last visit: No Patient Requires Transmission-Based Precautions: No Implantable device outside of the clinic excluding No Patient Has Alerts: Yes cellular tissue based products placed in the center Patient Alerts: DNR since last visit: Has Dressing in Place as Prescribed: Yes Pain Present Now: No Electronic Signature(s) Signed: 06/06/2021 9:32:36 AM By: Sandre Kitty Entered By: Sandre Kitty on 06/05/2021 15:00:59 -------------------------------------------------------------------------------- Clinic Level of Care Assessment Details Patient Name: Date of Service: [name], [name] 06/05/2021 2:30 PM Medical Record Number: [id number] Patient Account Number: [id number] Date of Birth/Sex: Treating RN: 04-02-1925 (87 y.o. [name], [name] Primary Care Jove Beyl: [name] Other Clinician: Referring Rio Taber: Treating Janesia Joswick/Extender: Carley Hammed in Treatment: 9 Clinic Level of Care Assessment Items TOOL 4 Quantity  Score X- 1 0 Use when only an EandM is performed on FOLLOW-UP visit ASSESSMENTS - Nursing Assessment / Reassessment X- 1 10 Reassessment of Co-morbidities (includes updates in patient status) X- 1 5 Reassessment of Adherence to Treatment Plan ASSESSMENTS - Wound and Skin A ssessment / Reassessment X - Simple Wound Assessment / Reassessment - one wound 1 5 []  - 0 Complex Wound Assessment / Reassessment - multiple wounds []  - 0 Dermatologic / Skin Assessment (not related to wound area) ASSESSMENTS - Focused Assessment X- 1 5 Circumferential Edema Measurements - multi extremities []  - 0 Nutritional Assessment / Counseling / Intervention []  - 0 Lower Extremity Assessment (monofilament, tuning fork, pulses) []  - 0 Peripheral Arterial Disease Assessment (using hand held doppler) ASSESSMENTS - Ostomy and/or Continence Assessment and Care []  - 0 Incontinence Assessment and Management []  - 0 Ostomy Care Assessment and Management (repouching, etc.) PROCESS - Coordination of Care X - Simple Patient / Family Education for ongoing care 1 15 []  - 0 Complex (extensive) Patient / Family Education for ongoing care X- 1 10 Staff obtains Consents, Records, T Results / Process Orders est X- 1 10 Staff telephones HHA, Nursing Homes / Clarify orders / etc []  - 0 Routine Transfer to another Facility (non-emergent condition) []  - 0 Routine Hospital Admission (non-emergent condition) []  - 0 New Admissions / Biomedical engineer / Ordering NPWT Apligraf, etc. , []  - 0 Emergency Hospital Admission (emergent condition) X- 1 10 Simple Discharge Coordination []  - 0 Complex (extensive) Discharge Coordination PROCESS - Special Needs []  - 0 Pediatric / Minor Patient Management []  - 0 Isolation Patient Management []  - 0 Hearing / Language / Visual special needs []  - 0 Assessment of Community assistance (transportation, D/C planning, etc.) []  - 0 Additional assistance / Altered  mentation []  - 0 Support Surface(s) Assessment (bed, cushion, seat, etc.) INTERVENTIONS - Wound Cleansing / Measurement X - Simple Wound Cleansing - one wound 1 5 []  - 0 Complex Wound Cleansing -  multiple wounds X- 1 5 Wound Imaging (photographs - any number of wounds) []  - 0 Wound Tracing (instead of photographs) X- 1 5 Simple Wound Measurement - one wound []  - 0 Complex Wound Measurement - multiple wounds INTERVENTIONS - Wound Dressings []  - 0 Small Wound Dressing one or multiple wounds []  - 0 Medium Wound Dressing one or multiple wounds []  - 0 Large Wound Dressing one or multiple wounds []  - 0 Application of Medications - topical []  - 0 Application of Medications - injection INTERVENTIONS - Miscellaneous []  - 0 External ear exam []  - 0 Specimen Collection (cultures, biopsies, blood, body fluids, etc.) []  - 0 Specimen(s) / Culture(s) sent or taken to Lab for analysis []  - 0 Patient Transfer (multiple staff / Civil Service fast streamer / Similar devices) []  - 0 Simple Staple / Suture removal (25 or less) []  - 0 Complex Staple / Suture removal (26 or more) []  - 0 Hypo / Hyperglycemic Management (close monitor of Blood Glucose) []  - 0 Ankle / Brachial Index (ABI) - do not check if billed separately X- 1 5 Vital Signs Has the patient been seen at the hospital within the last three years: Yes Total Score: 90 Level Of Care: New/Established - Level 3 Electronic Signature(s) Signed: 06/11/2021 1:16:02 PM By: Rhae Hammock RN Entered By: Rhae Hammock on 06/05/2021 15:53:58 -------------------------------------------------------------------------------- Encounter Discharge Information Details Patient Name: Date of Service: [name] [name] 06/05/2021 2:30 PM Medical Record Number: [id number] Patient Account Number: [id number] Date of Birth/Sex: Treating RN: 08-02-1924 (87 y.o. [name], [name] Primary Care Priscille Shadduck: [name] Other Clinician: Referring  Rocket Gunderson: Treating Audreena Sachdeva/Extender: Carley Hammed in Treatment: 9 Encounter Discharge Information Items Discharge Condition: Stable Ambulatory Status: Wheelchair Discharge Destination: Home Transportation: Private Auto Accompanied By: self Schedule Follow-up Appointment: Yes Clinical Summary of Care: Patient Declined Electronic Signature(s) Signed: 06/11/2021 1:16:02 PM By: Rhae Hammock RN Entered By: Rhae Hammock on 06/05/2021 15:55:16 -------------------------------------------------------------------------------- Lower Extremity Assessment Details Patient Name: Date of Service: REDELL, NAZIR 06/05/2021 2:30 PM Medical Record Number: [id number] Patient Account Number: [id number] Date of Birth/Sex: Treating RN: 1925-01-16 (87 y.o. [name], [name] Primary Care Eduin Friedel: [name] Other Clinician: Referring Derrian Poli: Treating Ousmane Seeman/Extender: Carley Hammed in Treatment: 9 Edema Assessment Assessed: Shirlyn Goltz: No] Patrice Paradise: No] Edema: [Left: Ye] [Right: s] Calf Left: Right: Point of Measurement: From Medial Instep 37.5 cm Ankle Left: Right: Point of Measurement: From Medial Instep 21 cm Vascular Assessment Pulses: Dorsalis Pedis Palpable: [Right:Yes] Electronic Signature(s) Signed: 06/06/2021 9:32:36 AM By: Sandre Kitty Signed: 06/11/2021 1:16:02 PM By: Rhae Hammock RN Entered By: Sandre Kitty on 06/05/2021 15:22:04 -------------------------------------------------------------------------------- Multi Wound Chart Details Patient Name: Date of Service: [name] [name]. 06/05/2021 2:30 PM Medical Record Number: [id number] Patient Account Number: [id number] Date of Birth/Sex: Treating RN: November 28, 1924 (87 y.o. [name], [name] Primary Care Tenesha Garza: [name] Other Clinician: Referring Lynnetta Tom: Treating Manasvini Whatley/Extender: Carley Hammed in Treatment:  9 Vital Signs Height(in): 56 Pulse(bpm): 23 Weight(lbs): 170 Blood Pressure(mmHg): 168/72 Body Mass Index(BMI): 26 Temperature(F): 98.3 Respiratory Rate(breaths/min): 18 Photos: [N/A:N/A] Right, Posterior Lower Leg N/A N/A Wound Location: Gradually Appeared N/A N/A Wounding Event: Venous Leg Ulcer N/A N/A Primary Etiology: Arrhythmia, Congestive Heart Failure, N/A N/A Comorbid History: Coronary Artery Disease, Hypertension, Peripheral Venous Disease, Osteoarthritis, Seizure Disorder 03/19/2021 N/A N/A Date Acquired: 9 N/A N/A Weeks of Treatment: Healed - Epithelialized N/A N/A Wound Status: 0x0x0 N/A N/A Measurements L x W x D (cm) 0 N/A N/A  A (cm) : rea 0 N/A N/A Volume (cm) : 100.00% N/A N/A % Reduction in Area: 100.00% N/A N/A % Reduction in Volume: Full Thickness Without Exposed N/A N/A Classification: Support Structures Small N/A N/A Exudate Amount: Serosanguineous N/A N/A Exudate Type: red, brown N/A N/A Exudate Color: Flat and Intact N/A N/A Wound Margin: Large (67-100%) N/A N/A Granulation Amount: Pink N/A N/A Granulation Quality: None Present (0%) N/A N/A Necrotic Amount: Fat Layer (Subcutaneous Tissue): Yes N/A N/A Exposed Structures: Fascia: No Tendon: No Muscle: No Joint: No Bone: No Large (67-100%) N/A N/A Epithelialization: Treatment Notes Wound #1 (Lower Leg) Wound Laterality: Right, Posterior Cleanser Peri-Wound Care Topical Primary Dressing Secondary Dressing Secured With Compression Wrap Compression Stockings Add-Ons Electronic Signature(s) Signed: 06/05/2021 4:11:41 PM By: Linton Ham MD Signed: 06/11/2021 1:16:02 PM By: Rhae Hammock RN Entered By: Linton Ham on 06/05/2021 16:01:03 -------------------------------------------------------------------------------- Multi-Disciplinary Care Plan Details Patient Name: Date of Service: [name] [name] 06/05/2021 2:30 PM Medical Record Number:  [id number] Patient Account Number: [id number] Date of Birth/Sex: Treating RN: 04-01-1925 (87 y.o. [name], [name] Primary Care Kelly Ranieri: [name] Other Clinician: Referring Anatasia Tino: Treating Selinda Korzeniewski/Extender: Carley Hammed in Treatment: [place] reviewed with physician Active Inactive Electronic Signature(s) Signed: 06/11/2021 1:16:02 PM By: Rhae Hammock RN Entered By: Rhae Hammock on 06/05/2021 15:51:52 -------------------------------------------------------------------------------- Pain Assessment Details Patient Name: Date of Service: DAMONTA, COSSEY 06/05/2021 2:30 PM Medical Record Number: [id number] Patient Account Number: [id number] Date of Birth/Sex: Treating RN: Feb 27, 1925 (87 y.o. [name], [name] Primary Care Mamta Rimmer: [name] Other Clinician: Referring Elexius Minar: Treating Ashlynn Gunnels/Extender: Carley Hammed in Treatment: 9 Active Problems Location of Pain Severity and Description of Pain Patient Has Paino No Site Locations Pain Management and Medication Current Pain Management: Electronic Signature(s) Signed: 06/06/2021 9:32:36 AM By: Sandre Kitty Signed: 06/11/2021 1:16:02 PM By: Rhae Hammock RN Entered By: Sandre Kitty on 06/05/2021 15:01:47 -------------------------------------------------------------------------------- Patient/Caregiver Education Details Patient Name: Date of Service: [name] [name] 12/21/2022andnbsp2:30 PM Medical Record Number: [id number] Patient Account Number: [id number] Date of Birth/Gender: Treating RN: 1925-06-02 (87 y.o. Erie Noe Primary Care Physician: [name] Other Clinician: Referring Physician: Treating Physician/Extender: Carley Hammed in Treatment: 9 Education Assessment Education Provided To: Patient Education Topics Provided Wound/Skin Impairment: Methods:  Explain/Verbal Responses: State content correctly Electronic Signature(s) Signed: 06/11/2021 1:16:02 PM By: Rhae Hammock RN Entered By: Rhae Hammock on 06/05/2021 15:52:11 -------------------------------------------------------------------------------- Wound Assessment Details Patient Name: Date of Service: [name] [name] 06/05/2021 2:30 PM Medical Record Number: [id number] Patient Account Number: [id number] Date of Birth/Sex: Treating RN: 1925/06/10 (87 y.o. [name], [name] Primary Care Angellee Cohill: [name] Other Clinician: Referring Chryl Holten: Treating Demiya Magno/Extender: Carley Hammed in Treatment: 9 Wound Status Wound Number: 1 Primary Venous Leg Ulcer Etiology: Wound Location: Right, Posterior Lower Leg Wound Healed - Epithelialized Wounding Event: Gradually Appeared Status: Date Acquired: 03/19/2021 Comorbid Arrhythmia, Congestive Heart Failure, Coronary Artery Disease, Weeks Of Treatment: 9 History: Hypertension, Peripheral Venous Disease, Osteoarthritis, Seizure Clustered Wound: No Disorder Photos Wound Measurements Length: (cm) Width: (cm) Depth: (cm) Area: (cm) Volume: (cm) 0 % Reduction in Area: 100% 0 % Reduction in Volume: 100% 0 Epithelialization: Large (67-100%) 0 Tunneling: No 0 Undermining: No Wound Description Classification: Full Thickness Without Exposed Support Structures Wound Margin: Flat and Intact Exudate Amount: Small Exudate Type: Serosanguineous Exudate Color: red, brown Foul Odor After Cleansing: No Slough/Fibrino No Wound Bed Granulation Amount: Large (67-100%) Exposed Structure Granulation Quality: Pink  Fascia Exposed: No Necrotic Amount: None Present (0%) Fat Layer (Subcutaneous Tissue) Exposed: Yes Tendon Exposed: No Muscle Exposed: No Joint Exposed: No Bone Exposed: No Treatment Notes Wound #1 (Lower Leg) Wound Laterality: Right, Posterior Cleanser Peri-Wound  Care Topical Primary Dressing Secondary Dressing Secured With Compression Wrap Compression Stockings Add-Ons Electronic Signature(s) Signed: 06/11/2021 1:16:02 PM By: Rhae Hammock RN Entered By: Rhae Hammock on 06/05/2021 15:50:33 -------------------------------------------------------------------------------- Vitals Details Patient Name: Date of Service: [name] [name]. 06/05/2021 2:30 PM Medical Record Number: [id number] Patient Account Number: [id number] Date of Birth/Sex: Treating RN: 10-01-1924 (87 y.o. [name], [name] Primary Care Denney Shein: [name] Other Clinician: Referring Tanijah Morais: Treating Sharron Simpson/Extender: Carley Hammed in Treatment: 9 Vital Signs Time Taken: 15:00 Temperature (F): 98.3 Height (in): 68 Pulse (bpm): 69 Weight (lbs): 170 Respiratory Rate (breaths/min): 18 Body Mass Index (BMI): 25.8 Blood Pressure (mmHg): 168/72 Reference Range: 80 - 120 mg / dl Electronic Signature(s) Signed: 06/06/2021 9:32:36 AM By: Sandre Kitty Entered By: Sandre Kitty on 06/05/2021 15:01:26

## 2021-06-11 NOTE — Progress Notes (Signed)
[name] [name], [name] [name] ([id number]) Visit Report for 06/05/2021 HPI Details Patient Name: Date of Service: [name] [name], [name] [name] 06/05/2021 2:30 PM Medical Record Number: [id number] Patient Account Number: [id number] Date of Birth/Sex: Treating RN: [birth year] (87 y.o. [name], [name] Primary Care Provider: [name] Other Clinician: Referring Provider: Treating Provider/Extender: Carley Hammed in Treatment: 9 History of Present Illness HPI Description: ADMISSION 04/02/2021 This is a pleasant 87 year old man sent to our clinic by Dr. Lyndel Safe from the [facility] of [facility]. He has a wound on the right posterior calf. I am uncertain about the duration of this however it looks to be chronic. They are using Santyl covered with silver alginate he does not have any compression. The wound is 100% covered in very fibrinous adherent slough. It also appears that this is quite painful. The patient is exceptionally hard of hearing so it is difficult to get any additional history out of him. Past medical history includes hypertension, coronary artery disease, congestive heart failure, BPH, seizure disorder. He is nonambulatory for reasons that are not totally clear. He lives at the [facility] of [facility]. ABI in our clinic was 1.03 on the right 11/2; 2-week follow-up. He is having the dressing changed at the facility. He arrives in the clinic with the area about the same in terms of surface area but better looking surface we have been using Iodoflex under 3 layer compression. Unfortunately we still do not have any history of exactly how long this is been there or how it happened. 11/16; 2-week follow-up. Nice improvement in the wound. He is using Iodoflex over 3 layer compression. 11/30 2-week follow-up. [facility] [place]. We have been using Iodoflex under 3 layer compression. They are not wrapping his leg properly  but nevertheless the wound area has come down nicely. The patient requests coming here every 3 weeks instead of every 2 12/21; the area on the posterior right calf is fully epithelialized. The patient has chronic venous insufficiency. Electronic Signature(s) Signed: 06/05/2021 4:11:41 PM By: Linton Ham MD Entered By: Linton Ham on 06/05/2021 16:02:07 -------------------------------------------------------------------------------- Physical Exam Details Patient Name: Date of Service: [name] [name], [name] [name] 06/05/2021 2:30 PM Medical Record Number: [id number] Patient Account Number: [id number] Date of Birth/Sex: Treating RN: 1925-01-09 (87 y.o. Erie Noe Primary Care Provider: [name] Other Clinician: Referring Provider: Treating Provider/Extender: Carley Hammed in Treatment: 9 Constitutional Patient is hypertensive.. Pulse regular and within target range for patient.Marland Kitchen Respirations regular, non-labored and within target range.. Temperature is normal and within the target range for the patient.Marland Kitchen Appears in no distress. Notes Wound exam; right posterior calf. Everything is closed. He has significant chronic venous insufficiency on both sides. No evidence of arterial insufficiency Electronic Signature(s) Signed: 06/05/2021 4:11:41 PM By: Linton Ham MD Entered By: Linton Ham on 06/05/2021 16:03:42 -------------------------------------------------------------------------------- Physician Orders Details Patient Name: Date of Service: [name] [name] 06/05/2021 2:30 PM Medical Record Number: [id number] Patient Account Number: [id number] Date of Birth/Sex: Treating RN: [birth year] (87 y.o. [name], [name] Primary Care Provider: [name] Other Clinician: Referring Provider: Treating Provider/Extender: Carley Hammed in Treatment: 9 Verbal / Phone Orders: No Diagnosis Coding ICD-10 Coding Code  Description I87.331 Chronic venous hypertension (idiopathic) with ulcer and inflammation of right lower extremity L97.218 Non-pressure chronic ulcer of right calf with other specified severity Discharge From [facility] Services Discharge from [place] Edema Control - Lymphedema /  SCD / Other Elevate legs to the level of the heart or above for 30 minutes daily and/or when sitting, a frequency of: Avoid standing for long periods of time. Patient to wear own compression stockings every day. Moisturize legs daily. Electronic Signature(s) Signed: 06/05/2021 4:11:41 PM By: Linton Ham MD Signed: 06/11/2021 1:16:02 PM By: Rhae Hammock RN Entered By: Rhae Hammock on 06/05/2021 15:51:34 -------------------------------------------------------------------------------- Problem List Details Patient Name: Date of Service: [name] [name], [name] [name] 06/05/2021 2:30 PM Medical Record Number: [id number] Patient Account Number: [id number] Date of Birth/Sex: Treating RN: 13-Feb-1925 (87 y.o. [name], [name] Primary Care Provider: [name] Other Clinician: Referring Provider: Treating Provider/Extender: Carley Hammed in Treatment: 9 Active Problems ICD-10 Encounter Code Description Active Date MDM Diagnosis I87.331 Chronic venous hypertension (idiopathic) with ulcer and inflammation of right 04/02/2021 No Yes lower extremity L97.218 Non-pressure chronic ulcer of right calf with other specified severity 04/02/2021 No Yes Inactive Problems Resolved Problems Electronic Signature(s) Signed: 06/05/2021 4:11:41 PM By: Linton Ham MD Entered By: Linton Ham on 06/05/2021 16:00:54 -------------------------------------------------------------------------------- Progress Note Details Patient Name: Date of Service: [name] [name]. 06/05/2021 2:30 PM Medical Record Number: [id number] Patient Account Number: [id number] Date of Birth/Sex: Treating  RN: Feb 12, 1925 (87 y.o. [name], [name] Primary Care Provider: [name] Other Clinician: Referring Provider: Treating Provider/Extender: Carley Hammed in Treatment: 9 Subjective History of Present Illness (HPI) ADMISSION 04/02/2021 This is a pleasant 87 year old man sent to our clinic by Dr. Lyndel Safe from the [facility] unit of [facility] [place]. He has a wound on the right posterior calf. I am uncertain about the duration of this however it looks to be chronic. They are using Santyl covered with silver alginate he does not have any compression. The wound is 100% covered in very fibrinous adherent slough. It also appears that this is quite painful. The patient is exceptionally hard of hearing so it is difficult to get any additional history out of him. Past medical history includes hypertension, coronary artery disease, congestive heart failure, BPH, seizure disorder. He is nonambulatory for reasons that are not totally clear. He lives at the [facility] of [facility] [place]. ABI in our clinic was 1.03 on the right 11/2; 2-week follow-up. He is having the dressing changed at the facility. He arrives in the clinic with the area about the same in terms of surface area but better looking surface we have been using Iodoflex under 3 layer compression. Unfortunately we still do not have any history of exactly how long this is been there or how it happened. 11/16; 2-week follow-up. Nice improvement in the wound. He is using Iodoflex over 3 layer compression. 11/30 2-week follow-up. [facility] is at the [facility] [place]. We have been using Iodoflex under 3 layer compression. They are not wrapping his leg properly but nevertheless the wound area has come down nicely. The patient requests coming here every 3 weeks instead of every 2 12/21; the area on the posterior right calf is fully epithelialized. The patient has chronic venous  insufficiency. Objective Constitutional Patient is hypertensive.. Pulse regular and within target range for patient.Marland Kitchen Respirations regular, non-labored and within target range.. Temperature is normal and within the target range for the patient.Marland Kitchen Appears in no distress. Vitals Time Taken: 3:00 PM, Height: 68 in, Weight: 170 lbs, BMI: 25.8, Temperature: 98.3 F, Pulse: 69 bpm, Respiratory Rate: 18 breaths/min, Blood Pressure: 168/72 mmHg. General Notes: Wound exam; right posterior calf. Everything is closed. He  has significant chronic venous insufficiency on both sides. No evidence of arterial insufficiency Integumentary (Hair, Skin) Wound #1 status is Healed - Epithelialized. Original cause of wound was Gradually Appeared. The date acquired was: 03/19/2021. The wound has been in treatment 9 weeks. The wound is located on the Right,Posterior Lower Leg. The wound measures 0cm length x 0cm width x 0cm depth; 0cm^2 area and 0cm^3 volume. There is Fat Layer (Subcutaneous Tissue) exposed. There is no tunneling or undermining noted. There is a small amount of serosanguineous drainage noted. The wound margin is flat and intact. There is large (67-100%) pink granulation within the wound bed. There is no necrotic tissue within the wound bed. Assessment Active Problems ICD-10 Chronic venous hypertension (idiopathic) with ulcer and inflammation of right lower extremity Non-pressure chronic ulcer of right calf with other specified severity Plan Discharge From [facility] Services: Discharge from [place] Edema Control - Lymphedema / SCD / Other: Elevate legs to the level of the heart or above for 30 minutes daily and/or when sitting, a frequency of: Avoid standing for long periods of time. Patient to wear own compression stockings every day. Moisturize legs daily. 1. The patient can be discharged from the clinic 2. He would benefit from 20/30 mmHg below-knee stockings on during the day off at night.  Moisturizer to his skin etc. we wrote instructions to [facility] [employer]) Signed: 06/05/2021 4:11:41 PM By: Linton Ham MD Entered By: Linton Ham on 06/05/2021 16:04:44 -------------------------------------------------------------------------------- SuperBill Details Patient Name: Date of Service: [name] [name] 06/05/2021 Medical Record Number: [id number] Patient Account Number: [id number] Date of Birth/Sex: Treating RN: [birth year] (87 y.o. [name], [name] Primary Care Provider: [name] Other Clinician: Referring Provider: Treating Provider/Extender: Carley Hammed in Treatment: 9 Diagnosis Coding ICD-10 Codes Code Description [phone] Chronic venous hypertension (idiopathic) with ulcer and inflammation of right lower extremity L97.218 Non-pressure chronic ulcer of right calf with other specified severity Facility Procedures CPT4 Code: [id number] Description: 99213 - WOUND CARE VISIT-LEV 3 EST PT Modifier: Quantity: 1 Physician Procedures : CPT4 Code Description Modifier [id number] 99213 - WC PHYS LEVEL 3 - EST PT ICD-10 Diagnosis Description I87.331 Chronic venous hypertension (idiopathic) with ulcer and inflammation of right lower extremity L97.218 Non-pressure chronic ulcer of right calf  with other specified severity Quantity: 1 Electronic Signature(s) Signed: 06/05/2021 4:11:41 PM By: Linton Ham MD Entered By: Linton Ham on 06/05/2021 16:05:02

## 2021-06-13 DIAGNOSIS — R2681 Unsteadiness on feet: Secondary | ICD-10-CM | POA: Diagnosis not present

## 2021-06-13 DIAGNOSIS — M25652 Stiffness of left hip, not elsewhere classified: Secondary | ICD-10-CM | POA: Diagnosis not present

## 2021-06-13 DIAGNOSIS — M6281 Muscle weakness (generalized): Secondary | ICD-10-CM | POA: Diagnosis not present

## 2021-06-13 DIAGNOSIS — W19XXXA Unspecified fall, initial encounter: Secondary | ICD-10-CM | POA: Diagnosis not present

## 2021-06-13 DIAGNOSIS — M25661 Stiffness of right knee, not elsewhere classified: Secondary | ICD-10-CM | POA: Diagnosis not present

## 2021-06-13 DIAGNOSIS — M25651 Stiffness of right hip, not elsewhere classified: Secondary | ICD-10-CM | POA: Diagnosis not present

## 2021-06-13 DIAGNOSIS — R278 Other lack of coordination: Secondary | ICD-10-CM | POA: Diagnosis not present

## 2021-06-13 DIAGNOSIS — M25662 Stiffness of left knee, not elsewhere classified: Secondary | ICD-10-CM | POA: Diagnosis not present

## 2021-06-13 DIAGNOSIS — M150 Primary generalized (osteo)arthritis: Secondary | ICD-10-CM | POA: Diagnosis not present

## 2021-06-14 DIAGNOSIS — W19XXXA Unspecified fall, initial encounter: Secondary | ICD-10-CM | POA: Diagnosis not present

## 2021-06-14 DIAGNOSIS — M6281 Muscle weakness (generalized): Secondary | ICD-10-CM | POA: Diagnosis not present

## 2021-06-14 DIAGNOSIS — R278 Other lack of coordination: Secondary | ICD-10-CM | POA: Diagnosis not present

## 2021-06-14 DIAGNOSIS — M25662 Stiffness of left knee, not elsewhere classified: Secondary | ICD-10-CM | POA: Diagnosis not present

## 2021-06-14 DIAGNOSIS — R2681 Unsteadiness on feet: Secondary | ICD-10-CM | POA: Diagnosis not present

## 2021-06-14 DIAGNOSIS — M25661 Stiffness of right knee, not elsewhere classified: Secondary | ICD-10-CM | POA: Diagnosis not present

## 2021-06-14 DIAGNOSIS — M25652 Stiffness of left hip, not elsewhere classified: Secondary | ICD-10-CM | POA: Diagnosis not present

## 2021-06-14 DIAGNOSIS — M25651 Stiffness of right hip, not elsewhere classified: Secondary | ICD-10-CM | POA: Diagnosis not present

## 2021-06-14 DIAGNOSIS — M150 Primary generalized (osteo)arthritis: Secondary | ICD-10-CM | POA: Diagnosis not present

## 2021-06-25 DIAGNOSIS — B351 Tinea unguium: Secondary | ICD-10-CM | POA: Diagnosis not present

## 2021-06-25 DIAGNOSIS — L84 Corns and callosities: Secondary | ICD-10-CM | POA: Diagnosis not present

## 2021-06-25 DIAGNOSIS — M79672 Pain in left foot: Secondary | ICD-10-CM | POA: Diagnosis not present

## 2021-06-25 DIAGNOSIS — M79671 Pain in right foot: Secondary | ICD-10-CM | POA: Diagnosis not present

## 2021-06-25 DIAGNOSIS — Q6689 Other  specified congenital deformities of feet: Secondary | ICD-10-CM | POA: Diagnosis not present

## 2021-07-09 DIAGNOSIS — L820 Inflamed seborrheic keratosis: Secondary | ICD-10-CM | POA: Diagnosis not present

## 2021-07-09 DIAGNOSIS — L821 Other seborrheic keratosis: Secondary | ICD-10-CM | POA: Diagnosis not present

## 2021-07-09 DIAGNOSIS — L814 Other melanin hyperpigmentation: Secondary | ICD-10-CM | POA: Diagnosis not present

## 2021-07-09 DIAGNOSIS — I872 Venous insufficiency (chronic) (peripheral): Secondary | ICD-10-CM | POA: Diagnosis not present

## 2021-07-09 DIAGNOSIS — L89219 Pressure ulcer of right hip, unspecified stage: Secondary | ICD-10-CM | POA: Diagnosis not present

## 2021-07-09 DIAGNOSIS — D229 Melanocytic nevi, unspecified: Secondary | ICD-10-CM | POA: Diagnosis not present

## 2021-07-16 ENCOUNTER — Encounter: Payer: Self-pay | Admitting: Internal Medicine

## 2021-07-16 ENCOUNTER — Non-Acute Institutional Stay (SKILLED_NURSING_FACILITY): Payer: Medicare HMO | Admitting: Internal Medicine

## 2021-07-16 DIAGNOSIS — K219 Gastro-esophageal reflux disease without esophagitis: Secondary | ICD-10-CM | POA: Diagnosis not present

## 2021-07-16 DIAGNOSIS — I2510 Atherosclerotic heart disease of native coronary artery without angina pectoris: Secondary | ICD-10-CM | POA: Diagnosis not present

## 2021-07-16 DIAGNOSIS — Z8673 Personal history of transient ischemic attack (TIA), and cerebral infarction without residual deficits: Secondary | ICD-10-CM | POA: Diagnosis not present

## 2021-07-16 DIAGNOSIS — E782 Mixed hyperlipidemia: Secondary | ICD-10-CM | POA: Diagnosis not present

## 2021-07-16 DIAGNOSIS — I10 Essential (primary) hypertension: Secondary | ICD-10-CM

## 2021-07-16 DIAGNOSIS — I480 Paroxysmal atrial fibrillation: Secondary | ICD-10-CM | POA: Diagnosis not present

## 2021-07-16 DIAGNOSIS — R569 Unspecified convulsions: Secondary | ICD-10-CM

## 2021-07-16 DIAGNOSIS — I5032 Chronic diastolic (congestive) heart failure: Secondary | ICD-10-CM | POA: Diagnosis not present

## 2021-07-16 NOTE — Progress Notes (Signed)
Location:  [place] Room Number: 21 Place of Service:  SNF (31)  Provider: [name]   Code Status: DNR Goals of Care:  Advanced Directives 07/16/2021  Does Patient Have a Medical Advance Directive? Yes  Type of Advance Directive Living will;Healthcare Power of Attorney  Does patient want to make changes to medical advance directive? No - Patient declined  Copy of [place] in Chart? Yes - validated most recent copy scanned in chart (See row information)  Pre-existing out of facility DNR order (yellow form or pink MOST form) -     Chief Complaint  Patient presents with   Medical Management of Chronic Issues   Quality Metric Gaps    Shingrix NCIR/Matrix verified    HPI: Patient is a 88 y.o. male seen today for medical management of chronic diseases.    Resident of SNF  Patient has a history of BPH, bradycardia,  CAD, hypertension, diastolic CHF,  GERD, H/o Left Sphenoid Meningioma And Chronic Seizures  ? TIA Is now on Higher dose of Aspirin per his request  LLE wound now healed  He  is stable. No new Nursing issues.  No Behavior issues His weight is stable  No Falls Wt Readings from Last 3 Encounters:  07/16/21 180 lb 6.4 oz (81.8 kg)  05/03/21 174 lb (78.9 kg)  03/19/21 173 lb 12.8 oz (78.8 kg)   Able to do his transfers No Acute issue  Past Medical History:  Diagnosis Date   Allergic rhinitis    Amnesia    Atherosclerotic heart disease of native coronary artery without angina pectoris    BPH (benign prostatic hyperplasia)    Bradycardia    Chronic diastolic (congestive) heart failure (HCC)    Colon polyps    Coronary artery disease    MODERATE   Dizziness    Edema of lower extremity    Erectile dysfunction    H. pylori infection    Hx of    Hard of hearing    Headache(784.0)    History of colon polyps 2009   Dr. Orr/Colonoscopy -small cecal adenoma   History of falling    Hx of colonoscopy  2009   Dr Lajoyce Corners, hemorrhoids & polyps   Hyperlipidemia    Hypertension    Hypertensive heart disease with heart failure ([place])    Hypokalemia    Internal hemorrhoids 2009   Dr. Lajoyce Corners Colonoscopy   Metabolic encephalopathy    Mitral valve regurgitation    Muscle weakness (generalized)    Pneumonia    Primary generalized (osteo)arthritis    Reflux esophagitis    Sepsis ([place]) 11/04/2018   Unspecified hemorrhoids    Unsteadiness on feet    Vitamin D deficiency     Past Surgical History:  Procedure Laterality Date   APPENDECTOMY     CARDIAC CATHETERIZATION  1997   REVEALED MILD TO MODERATE IRREGULARITIES. HIS LEFT EVNTRICULAR SYSTOLIC FUNCTION REVEALED NORMAL EJECTION FRACTION WITH EF OF 70%   CATARACT EXTRACTION, BILATERAL     COLONOSCOPY  multiple   fatty tumor removal     benign   HEMORROIDECTOMY     HIP ARTHROPLASTY Left 08/17/2013   Procedure: ARTHROPLASTY BIPOLAR HIP;  Surgeon: Gearlean Alf, MD;  Location: [facility];  Service: Orthopedics;  Laterality: Left;   INGUINAL HERNIA REPAIR Bilateral 2006   lower back surgery  2006   NASAL SEPTUM SURGERY     TONSILLECTOMY     UPPER GASTROINTESTINAL ENDOSCOPY  No Known Allergies  Outpatient Encounter Medications as of 07/16/2021  Medication Sig   acetaminophen (TYLENOL) 500 MG tablet Take 500 mg by mouth every 8 (eight) hours as needed.   amLODipine (NORVASC) 5 MG tablet Take 5 mg by mouth daily.   ammonium lactate (AMLACTIN) 12 % cream Apply 1 application topically at bedtime.   aspirin 325 MG tablet Take 325 mg by mouth daily.   DEXTRAN 70-HYPROMELLOSE OP Apply 1 drop to eye 3 (three) times daily as needed. Special Instructions: as needed for dry eyes/itching   Emollient (CERAVE) CREA Apply topically at bedtime. To lower extremities @ bedtime   furosemide (LASIX) 40 MG tablet Take 40 mg by mouth daily.   hydrocortisone (ANUSOL-HC) 2.5 % rectal cream Place 1 application rectally 2 (two) times daily as needed for anal itching  or hemorrhoids (USE PERINEAL APPLICATOR). With perineal applicator   levETIRAcetam (KEPPRA) 500 MG tablet Take 1 tablet (500 mg total) by mouth 2 (two) times daily.   lisinopril (ZESTRIL) 40 MG tablet Take 40 mg by mouth daily.   Multiple Vitamins-Minerals (THERA-M) TABS Take 1 tablet by mouth daily.   omeprazole (PRILOSEC) 20 MG capsule Take 20 mg by mouth daily.   potassium chloride SA (KLOR-CON) 20 MEQ tablet Take 20 mEq by mouth daily.   rosuvastatin (CRESTOR) 5 MG tablet Take 5 mg by mouth daily.    vitamin B-12 (CYANOCOBALAMIN) 1000 MCG tablet Take 1,000 mcg by mouth daily.   [DISCONTINUED] cadexomer iodine (IODOSORB) 0.9 % gel Apply 1 application topically 3 (three) times a week. Once A Day on Mon, Wed, Fri   [DISCONTINUED] METHOCARBAMOL PO Take 250 mg by mouth as needed. Take 250 mg by mouth in the morning and an additional 250 mg once a day as needed for muscle spasms   No facility-administered encounter medications on file as of 07/16/2021.    Review of Systems:  Review of Systems  Constitutional:  Negative for activity change, appetite change and unexpected weight change.  HENT: Negative.    Respiratory:  Negative for cough and shortness of breath.   Cardiovascular:  Positive for leg swelling.  Gastrointestinal:  Negative for constipation.  Genitourinary:  Negative for frequency.  Musculoskeletal:  Positive for gait problem. Negative for arthralgias and myalgias.  Skin: Negative.  Negative for rash.  Neurological:  Negative for dizziness and weakness.  Psychiatric/Behavioral:  Negative for confusion and sleep disturbance.   All other systems reviewed and are negative.  Health Maintenance  Topic Date Due   Zoster Vaccines- Shingrix (1 of 2) Never done   TETANUS/TDAP  08/15/2023   Pneumonia Vaccine 31+ Years old  Completed   INFLUENZA VACCINE  Completed   COVID-19 Vaccine  Completed   HPV VACCINES  Aged Out    Physical Exam: Vitals:   07/16/21 1512  BP: 118/66   Pulse: 69  Resp: 18  Temp: (!) 97 F (36.1 C)  SpO2: 95%  Weight: 180 lb 6.4 oz (81.8 kg)  Height: 5\' 7"  (1.702 m)   Body mass index is 28.25 kg/m. Physical Exam Vitals reviewed.  Constitutional:      Appearance: Normal appearance.  HENT:     Head: Normocephalic.     Mouth/Throat:     Mouth: Mucous membranes are moist.     Pharynx: Oropharynx is clear.  Eyes:     Pupils: Pupils are equal, round, and reactive to light.  Cardiovascular:     Rate and Rhythm: Normal rate and regular rhythm.  Pulses: Normal pulses.     Heart sounds: Murmur heard.  Pulmonary:     Effort: Pulmonary effort is normal. No respiratory distress.     Breath sounds: Normal breath sounds. No rales.  Abdominal:     General: Abdomen is flat. Bowel sounds are normal.     Palpations: Abdomen is soft.  Musculoskeletal:        General: Swelling present.     Cervical back: Neck supple.  Skin:    General: Skin is warm.  Neurological:     General: No focal deficit present.     Mental Status: He is alert and oriented to person, place, and time.  Psychiatric:        Mood and Affect: Mood normal.        Thought Content: Thought content normal.    Labs reviewed: Basic Metabolic Panel: Recent Labs    10/09/20 1607 11/14/20 0000 02/05/21 0000  NA 139 137 137  K 4.6 4.3 4.0  CL 104 102 102  CO2 28* 28* 28*  BUN 15 17 14   CREATININE 1.1 0.9 0.8  CALCIUM 8.8 9.2 8.7   Liver Function Tests: Recent Labs    10/09/20 1607 11/14/20 0000  AST 16 18  ALT 14 20  ALKPHOS 54 63  ALBUMIN 3.4* 3.9   No results for input(s): LIPASE, AMYLASE in the last 8760 hours. No results for input(s): AMMONIA in the last 8760 hours. CBC: Recent Labs    09/25/20 0000 10/09/20 1607 11/14/20 0000  WBC 7.9 8.9 11.5  NEUTROABS  --  5,963.00 7,889.00  HGB 14.1 13.5 14.2  HCT 42 39* 43  PLT 218 236 231   Lipid Panel: Recent Labs    02/05/21 0000  CHOL 109  HDL 45  LDLCALC 50  TRIG 63   No results found  for: HGBA1C  Procedures since last visit: No results found.  Assessment/Plan 1. Chronic diastolic heart failure (HCC) Continue to do well on Lasix Swelling stable 2. Primary hypertension On Norvasc and Lisinopril  3. Coronary artery disease  On statin and Aspirin  4. PAF (paroxysmal atrial fibrillation) ([place]) Not on DOCA due to Falls   5. Seizure ([place]) On Keppra  6. History of TIA (transient ischemic attack) On aspirin and Statin Does not want to see Neurology  7. Gastroesophageal reflux disease, unspecified whether esophagitis present On Prilosec  8. Mixed hyperlipidemia On Statin 9 Chronic Venous Wound Healed now 10 Chronic Cystitis Per Urology  Labs/tests ordered:  * No order type specified *  [name], MD

## 2021-07-17 DIAGNOSIS — M150 Primary generalized (osteo)arthritis: Secondary | ICD-10-CM | POA: Diagnosis not present

## 2021-07-17 DIAGNOSIS — R278 Other lack of coordination: Secondary | ICD-10-CM | POA: Diagnosis not present

## 2021-07-17 DIAGNOSIS — R2681 Unsteadiness on feet: Secondary | ICD-10-CM | POA: Diagnosis not present

## 2021-07-17 DIAGNOSIS — W19XXXA Unspecified fall, initial encounter: Secondary | ICD-10-CM | POA: Diagnosis not present

## 2021-07-17 DIAGNOSIS — M6281 Muscle weakness (generalized): Secondary | ICD-10-CM | POA: Diagnosis not present

## 2021-07-18 DIAGNOSIS — I10 Essential (primary) hypertension: Secondary | ICD-10-CM | POA: Diagnosis not present

## 2021-07-18 DIAGNOSIS — E876 Hypokalemia: Secondary | ICD-10-CM | POA: Diagnosis not present

## 2021-07-18 LAB — BASIC METABOLIC PANEL
BUN: 16 (ref 4–21)
CO2: 31 — AB (ref 13–22)
Chloride: 102 (ref 99–108)
Creatinine: 1 (ref 0.6–1.3)
Glucose: 72
Potassium: 4.3 (ref 3.4–5.3)
Sodium: 141 (ref 137–147)

## 2021-07-18 LAB — HEPATIC FUNCTION PANEL
ALT: 17 (ref 10–40)
AST: 19 (ref 14–40)
Alkaline Phosphatase: 61 (ref 25–125)

## 2021-07-18 LAB — CBC AND DIFFERENTIAL
HCT: 41 (ref 41–53)
Hemoglobin: 14.1 (ref 13.5–17.5)
Platelets: 223 (ref 150–399)
WBC: 7.4

## 2021-07-18 LAB — LIPID PANEL
Cholesterol: 131 (ref 0–200)
HDL: 57 (ref 35–70)
LDL Cholesterol: 61
Triglycerides: 57 (ref 40–160)

## 2021-07-18 LAB — COMPREHENSIVE METABOLIC PANEL
Albumin: 3.9 (ref 3.5–5.0)
Calcium: 9.3 (ref 8.7–10.7)
Globulin: 2.2

## 2021-07-18 LAB — CBC: RBC: 4.32 (ref 3.87–5.11)

## 2021-07-22 ENCOUNTER — Encounter: Payer: Self-pay | Admitting: Nurse Practitioner

## 2021-07-22 ENCOUNTER — Non-Acute Institutional Stay (SKILLED_NURSING_FACILITY): Payer: Medicare HMO | Admitting: Nurse Practitioner

## 2021-07-22 DIAGNOSIS — I10 Essential (primary) hypertension: Secondary | ICD-10-CM

## 2021-07-22 DIAGNOSIS — I2510 Atherosclerotic heart disease of native coronary artery without angina pectoris: Secondary | ICD-10-CM

## 2021-07-22 DIAGNOSIS — N401 Enlarged prostate with lower urinary tract symptoms: Secondary | ICD-10-CM | POA: Diagnosis not present

## 2021-07-22 DIAGNOSIS — F5101 Primary insomnia: Secondary | ICD-10-CM | POA: Diagnosis not present

## 2021-07-22 DIAGNOSIS — N138 Other obstructive and reflux uropathy: Secondary | ICD-10-CM

## 2021-07-22 DIAGNOSIS — Z8673 Personal history of transient ischemic attack (TIA), and cerebral infarction without residual deficits: Secondary | ICD-10-CM

## 2021-07-22 DIAGNOSIS — I4891 Unspecified atrial fibrillation: Secondary | ICD-10-CM

## 2021-07-22 DIAGNOSIS — K219 Gastro-esophageal reflux disease without esophagitis: Secondary | ICD-10-CM

## 2021-07-22 DIAGNOSIS — I872 Venous insufficiency (chronic) (peripheral): Secondary | ICD-10-CM | POA: Diagnosis not present

## 2021-07-22 DIAGNOSIS — M5440 Lumbago with sciatica, unspecified side: Secondary | ICD-10-CM

## 2021-07-22 DIAGNOSIS — I5032 Chronic diastolic (congestive) heart failure: Secondary | ICD-10-CM | POA: Diagnosis not present

## 2021-07-22 DIAGNOSIS — R569 Unspecified convulsions: Secondary | ICD-10-CM | POA: Diagnosis not present

## 2021-07-22 DIAGNOSIS — R69 Illness, unspecified: Secondary | ICD-10-CM | POA: Diagnosis not present

## 2021-07-22 NOTE — Assessment & Plan Note (Signed)
blood pressure is controled on amlodipine, Lisinopril. Bun/creat 16/1,9 07/18/21

## 2021-07-22 NOTE — Assessment & Plan Note (Signed)
resolved right hand numbness, weakness, difficulty feeding self, changed to ASA 325mg qd from 81mg 11/13/20, on Statin, declined neurology consultation 

## 2021-07-22 NOTE — Assessment & Plan Note (Signed)
EF 65-70%, grade 1 diastolic dysfunction, on Furosemide, saw Cardiology 

## 2021-07-22 NOTE — Progress Notes (Signed)
Location:   [facility] Room Number: 61-A Place of Service:  SNF (31) Provider:  Kimiye Strathman, NP    Patient Care Team: [name], MD as PCP - General (Internal Medicine) Gatha Mayer, MD as Consulting Physician (Gastroenterology) [name], [name], MD as Consulting Physician (Cardiology) [name], MD as Attending Physician (Internal Medicine) Randal Yepiz X, NP as Nurse Practitioner (Internal Medicine)  Extended Emergency Contact Information Primary Emergency Contact: Akers,[name] Address: [place], [place] [address] [name] of [employer] Phone: [phone] Mobile Phone: [phone] Relation: Daughter Secondary Emergency Contact: Wilhoite,[name] Address: [place] [address] [name] of [place] Phone: [phone] Mobile Phone: [phone] Relation: Son  Code Status:  DNR Goals of care: Advanced Directive information Advanced Directives 07/22/2021  Does Patient Have a Medical Advance Directive? Yes  Type of Paramedic of [place];Living will  Does patient want to make changes to medical advance directive? No - Patient declined  Copy of [place] in Chart? Yes - validated most recent copy scanned in chart (See row information)  Pre-existing out of facility DNR order (yellow form or pink MOST form) -     Chief Complaint  Patient presents with   Medical Management of Chronic Issues    Routine Visit.   Immunizations    Discuss the need for Shingrix vaccine.    HPI:  Pt is a 88 y.o. male seen today for medical management of chronic diseases.       TIA, resolved right hand numbness, weakness, difficulty feeding self, changed to ASA 325mg  qd from 81mg  11/13/20, on Statin, declined neurology consultation.              Recurrent falls, due to unsteady on his legs, attempting standing up or transfer with no assistance.               BPH, saw urology. Chronic cystitis              PVD, BLE brownish erythematous pigmentation, swelling, RLE venous ulcer is healed.              Afib, onset 09/2019, heart rate is conrolled, not a candidate for anticoagulation             CHF, EF 81-01%, grade 1 diastolic dysfunction, on Furosemide, saw Cardiology             HTN, blood pressure is controled on amlodipine, Lisinopril. Bun/creat 16/1,9 07/18/21             Hx of CAD, hyperlipidemia, on Crestor, ASA. LDL 61 07/18/21.  10/09/20 EKG SR vent rate 65, no significant ST-T changes.              GERD, takes Omeprazole. Hgb 14.1 07/18/21             Insomnia, stable, off Trazodone.              Chronic lower back pain/right hip pain,  on Gabapentin 200mg  qd             OA, s/p left hip surgery, chronic right hip pain, w/c helps, prn Tylenol             Seizure: takes Keppra 500mg  bid. F/u neurology     Past Medical History:  Diagnosis Date   Allergic rhinitis  Amnesia    Atherosclerotic heart disease of native coronary artery without angina pectoris    BPH (benign prostatic hyperplasia)    Bradycardia    Chronic diastolic (congestive) heart failure (HCC)    Colon polyps    Coronary artery disease    MODERATE   Dizziness    Edema of lower extremity    Erectile dysfunction    H. pylori infection    Hx of    Hard of hearing    Headache(784.0)    History of colon polyps 2009   Dr. Orr/Colonoscopy -small cecal adenoma   History of falling    Hx of colonoscopy 2009   Dr Lajoyce Corners, hemorrhoids & polyps   Hyperlipidemia    Hypertension    Hypertensive heart disease with heart failure ([place])    Hypokalemia    Internal hemorrhoids 2009   Dr. Lajoyce Corners Colonoscopy   Metabolic encephalopathy    Mitral valve regurgitation    Muscle weakness (generalized)    Pneumonia    Primary generalized (osteo)arthritis    Reflux esophagitis    Sepsis ([place]) 11/04/2018   Unspecified hemorrhoids    Unsteadiness on feet    Vitamin D deficiency     Past Surgical History:  Procedure Laterality Date   APPENDECTOMY     CARDIAC CATHETERIZATION  1997   REVEALED MILD TO MODERATE IRREGULARITIES. HIS LEFT EVNTRICULAR SYSTOLIC FUNCTION REVEALED NORMAL EJECTION FRACTION WITH EF OF 70%   CATARACT EXTRACTION, BILATERAL     COLONOSCOPY  multiple   fatty tumor removal     benign   HEMORROIDECTOMY     HIP ARTHROPLASTY Left 08/17/2013   Procedure: ARTHROPLASTY BIPOLAR HIP;  Surgeon: Gearlean Alf, MD;  Location: [facility];  Service: Orthopedics;  Laterality: Left;   INGUINAL HERNIA REPAIR Bilateral 2006   lower back surgery  2006   NASAL SEPTUM SURGERY     TONSILLECTOMY     UPPER GASTROINTESTINAL ENDOSCOPY      No Known Allergies  Allergies as of 07/22/2021   No Known Allergies      Medication List        Accurate as of July 22, 2021 11:59 PM. If you have any questions, ask your nurse or doctor.          acetaminophen 500 MG tablet Commonly known as: TYLENOL Take 500 mg by mouth every 8 (eight) hours as needed.   amLODipine 5 MG tablet Commonly known as: NORVASC Take 5 mg by mouth daily.   ammonium lactate 12 % cream Commonly known as: AMLACTIN Apply 1 application topically at bedtime.   aspirin 325 MG tablet Take 325 mg by mouth daily.   CeraVe Crea Apply topically at bedtime. To lower extremities @ bedtime   DEXTRAN 70-HYPROMELLOSE OP Apply 1 drop to eye 3 (three) times daily as needed. Special Instructions: as needed for dry eyes/itching   furosemide 40 MG tablet Commonly known as: LASIX Take 40 mg by mouth daily.   hydrocortisone 2.5 % rectal cream Commonly known as: ANUSOL-HC Place 1 application rectally 2 (two) times daily as needed for anal itching or hemorrhoids (USE PERINEAL APPLICATOR). With perineal applicator   levETIRAcetam 500 MG tablet Commonly known as: KEPPRA Take 1 tablet (500 mg total) by mouth 2 (two) times daily.   lisinopril 40 MG tablet Commonly known as: ZESTRIL Take 40 mg by  mouth daily.   omeprazole 20 MG capsule Commonly known as: PRILOSEC Take 20 mg by mouth daily.   potassium chloride SA 20  MEQ tablet Commonly known as: KLOR-CON M Take 20 mEq by mouth daily.   rosuvastatin 5 MG tablet Commonly known as: CRESTOR Take 5 mg by mouth daily.   Thera-M Tabs Take 1 tablet by mouth daily.   vitamin B-12 1000 MCG tablet Commonly known as: CYANOCOBALAMIN Take 1,000 mcg by mouth daily.        Review of Systems  Constitutional:  Negative for fatigue, fever and unexpected weight change.  HENT:  Positive for hearing loss. Negative for congestion and voice change.   Eyes:  Negative for visual disturbance.  Respiratory:  Negative for cough, shortness of breath and wheezing.   Cardiovascular:  Positive for leg swelling.  Gastrointestinal:  Negative for abdominal pain and constipation.  Genitourinary:  Positive for frequency. Negative for difficulty urinating and urgency.  Musculoskeletal:  Positive for arthralgias, back pain and gait problem.       Lower back, right hip/thigh pain   Skin:  Negative for color change.       Chronic venous insufficiency skin changes BLE.  Neurological:  Negative for seizures, weakness and headaches.       Memory lapses. Tingling in toes. Right hand weakness/numbness is improved, placed ASA 325 11/13/20  Psychiatric/Behavioral:  Negative for behavioral problems and sleep disturbance. The patient is not nervous/anxious.    Immunization History  Administered Date(s) Administered   Influenza, High Dose Seasonal PF 03/19/2019, 03/19/2019   Influenza-Unspecified 04/08/2016, 03/27/2020, 04/03/2021   Moderna Sars-Covid-2 Vaccination 06/18/2019, 07/16/2019, 04/24/2020, 11/13/2020   Pfizer Covid-19 Vaccine Bivalent Booster 3yrs & up 03/05/2021   Pneumococcal Conjugate-13 06/02/2013   Pneumococcal Polysaccharide-23 04/07/2001   Tetanus 08/14/2013   Pertinent  Health Maintenance Due  Topic Date Due   INFLUENZA VACCINE  Completed    Fall Risk 09/21/2019 09/22/2019 09/22/2019 09/23/2019 01/12/2020  Falls in the past year? - - - - -  Was there an injury with Fall? - - - - -  Was there an injury with Fall? - - - - -  Patient Fall Risk Level High fall risk High fall risk High fall risk High fall risk High fall risk  Fall risk Follow up - - - - -   Functional Status Survey:    Vitals:   07/22/21 1025  BP: (!) 142/68  Pulse: 64  Resp: 18  Temp: (!) 97 F (36.1 C)  SpO2: 94%  Weight: 182 lb 8 oz (82.8 kg)  Height: 5\' 7"  (1.702 m)   Body mass index is 28.58 kg/m. Physical Exam Vitals and nursing note reviewed.  Constitutional:      Appearance: Normal appearance.  HENT:     Head: Normocephalic and atraumatic.     Mouth/Throat:     Mouth: Mucous membranes are moist.  Eyes:     Conjunctiva/sclera: Conjunctivae normal.     Pupils: Pupils are equal, round, and reactive to light.  Cardiovascular:     Rate and Rhythm: Normal rate and regular rhythm.     Heart sounds: Murmur heard.  Pulmonary:     Breath sounds: No rales.  Abdominal:     General: Bowel sounds are normal.     Palpations: Abdomen is soft.     Tenderness: There is no abdominal tenderness.  Genitourinary:    Comments: External hemorrhoids, no bleeding or injury.  Musculoskeletal:     Cervical back: Normal range of motion and neck supple. No muscular tenderness.     Right lower leg: Edema present.     Left lower leg: Edema  present.     Comments: Trace edema BLE. W/c for mobility.   Skin:    General: Skin is warm and dry.     Comments: Chronic venous insufficiency skin changes BLE  Neurological:     General: No focal deficit present.     Mental Status: He is alert. Mental status is at baseline.     Motor: No weakness.     Coordination: Coordination normal.     Gait: Gait abnormal.     Comments: Oriented to person, place.   Psychiatric:        Mood and Affect: Mood normal.        Behavior: Behavior normal.        Thought Content: Thought  content normal.    Labs reviewed: Recent Labs    11/14/20 0000 02/05/21 0000 07/18/21 0000  NA 137 137 141  K 4.3 4.0 4.3  CL 102 102 102  CO2 28* 28* 31*  BUN 17 14 16   CREATININE 0.9 0.8 1.0  CALCIUM 9.2 8.7 9.3   Recent Labs    10/09/20 1607 11/14/20 0000 07/18/21 0000  AST 16 18 19   ALT 14 20 17   ALKPHOS 54 63 61  ALBUMIN 3.4* 3.9 3.9   Recent Labs    10/09/20 1607 11/14/20 0000 07/18/21 0000  WBC 8.9 11.5 7.4  NEUTROABS 5,963.00 7,889.00  --   HGB 13.5 14.2 14.1  HCT 39* 43 41  PLT 236 231 223   Lab Results  Component Value Date   TSH 1.77 04/06/2020   No results found for: HGBA1C Lab Results  Component Value Date   CHOL 131 07/18/2021   HDL 57 07/18/2021   LDLCALC 61 07/18/2021   TRIG 57 07/18/2021   CHOLHDL 2.7 02/17/2017    Significant Diagnostic Results in last 30 days:  No results found.  Assessment/Plan GERD (gastroesophageal reflux disease) Stable,  takes Omeprazole. Hgb 14.1 07/18/21  Insomnia  stable, off Trazodone.   Lower back pain Lower back/right hip/leg pain, stable, uses w/c, continue Gabapentin and Tylenol.   Seizure ([place])  takes Keppra 500mg  bid. F/u neurology   CAD (coronary artery disease) hyperlipidemia, on Crestor, ASA. LDL 61 07/18/21.  10/09/20 EKG SR vent rate 65, no significant ST-T changes.   Hypertension blood pressure is controled on amlodipine, Lisinopril. Bun/creat 16/1,9 6/0/10  Chronic diastolic heart failure (HCC) EF 93-23%, grade 1 diastolic dysfunction, on Furosemide, saw Cardiology  New onset atrial fibrillation ([place])  onset 09/2019, heart rate is conrolled, not a candidate for anticoagulation  Venous insufficiency (chronic) (peripheral)  BLE brownish erythematous pigmentation, swelling, RLE venous ulcer is healed.   BPH (benign prostatic hyperplasia) saw urology. Chronic cystitis  History of TIA (transient ischemic attack)  resolved right hand numbness, weakness, difficulty feeding self, changed  to ASA 325mg  qd from 81mg  11/13/20, on Statin, declined neurology consultation.      Family/ staff Communication: plan of care reviewed with the patient and charge nurse.   Labs/tests ordered:  none  Time spend 35 minutes.

## 2021-07-22 NOTE — Assessment & Plan Note (Signed)
BLE brownish erythematous pigmentation, swelling, RLE venous ulcer is healed.

## 2021-07-22 NOTE — Assessment & Plan Note (Signed)
takes Keppra 500mg  bid. F/u neurology

## 2021-07-22 NOTE — Assessment & Plan Note (Signed)
onset 09/2019, heart rate is conrolled, not a candidate for anticoagulation

## 2021-07-22 NOTE — Assessment & Plan Note (Signed)
Stable,  takes Omeprazole. Hgb 14.1 07/18/21

## 2021-07-22 NOTE — Assessment & Plan Note (Signed)
hyperlipidemia, on Crestor, ASA. LDL 61 07/18/21.  10/09/20 EKG SR vent rate 65, no significant ST-T changes.

## 2021-07-22 NOTE — Assessment & Plan Note (Signed)
stable, off Trazodone.  

## 2021-07-22 NOTE — Assessment & Plan Note (Addendum)
Lower back/right hip/leg pain, stable, uses w/c, continue Gabapentin and Tylenol.

## 2021-07-22 NOTE — Assessment & Plan Note (Signed)
saw urology. Chronic cystitis 

## 2021-07-23 ENCOUNTER — Encounter: Payer: Self-pay | Admitting: Nurse Practitioner

## 2021-07-29 DIAGNOSIS — R278 Other lack of coordination: Secondary | ICD-10-CM | POA: Diagnosis not present

## 2021-07-29 DIAGNOSIS — M6281 Muscle weakness (generalized): Secondary | ICD-10-CM | POA: Diagnosis not present

## 2021-07-29 DIAGNOSIS — R2681 Unsteadiness on feet: Secondary | ICD-10-CM | POA: Diagnosis not present

## 2021-07-29 DIAGNOSIS — W19XXXA Unspecified fall, initial encounter: Secondary | ICD-10-CM | POA: Diagnosis not present

## 2021-07-29 DIAGNOSIS — M150 Primary generalized (osteo)arthritis: Secondary | ICD-10-CM | POA: Diagnosis not present

## 2021-07-30 DIAGNOSIS — M150 Primary generalized (osteo)arthritis: Secondary | ICD-10-CM | POA: Diagnosis not present

## 2021-07-30 DIAGNOSIS — W19XXXA Unspecified fall, initial encounter: Secondary | ICD-10-CM | POA: Diagnosis not present

## 2021-07-30 DIAGNOSIS — M6281 Muscle weakness (generalized): Secondary | ICD-10-CM | POA: Diagnosis not present

## 2021-07-30 DIAGNOSIS — R278 Other lack of coordination: Secondary | ICD-10-CM | POA: Diagnosis not present

## 2021-07-30 DIAGNOSIS — R2681 Unsteadiness on feet: Secondary | ICD-10-CM | POA: Diagnosis not present

## 2021-07-31 DIAGNOSIS — R278 Other lack of coordination: Secondary | ICD-10-CM | POA: Diagnosis not present

## 2021-07-31 DIAGNOSIS — M150 Primary generalized (osteo)arthritis: Secondary | ICD-10-CM | POA: Diagnosis not present

## 2021-07-31 DIAGNOSIS — W19XXXA Unspecified fall, initial encounter: Secondary | ICD-10-CM | POA: Diagnosis not present

## 2021-07-31 DIAGNOSIS — R2681 Unsteadiness on feet: Secondary | ICD-10-CM | POA: Diagnosis not present

## 2021-07-31 DIAGNOSIS — M6281 Muscle weakness (generalized): Secondary | ICD-10-CM | POA: Diagnosis not present

## 2021-08-03 DIAGNOSIS — R278 Other lack of coordination: Secondary | ICD-10-CM | POA: Diagnosis not present

## 2021-08-03 DIAGNOSIS — M150 Primary generalized (osteo)arthritis: Secondary | ICD-10-CM | POA: Diagnosis not present

## 2021-08-03 DIAGNOSIS — R2681 Unsteadiness on feet: Secondary | ICD-10-CM | POA: Diagnosis not present

## 2021-08-03 DIAGNOSIS — M6281 Muscle weakness (generalized): Secondary | ICD-10-CM | POA: Diagnosis not present

## 2021-08-03 DIAGNOSIS — W19XXXA Unspecified fall, initial encounter: Secondary | ICD-10-CM | POA: Diagnosis not present

## 2021-08-05 DIAGNOSIS — R278 Other lack of coordination: Secondary | ICD-10-CM | POA: Diagnosis not present

## 2021-08-05 DIAGNOSIS — M6281 Muscle weakness (generalized): Secondary | ICD-10-CM | POA: Diagnosis not present

## 2021-08-05 DIAGNOSIS — M150 Primary generalized (osteo)arthritis: Secondary | ICD-10-CM | POA: Diagnosis not present

## 2021-08-05 DIAGNOSIS — R2681 Unsteadiness on feet: Secondary | ICD-10-CM | POA: Diagnosis not present

## 2021-08-05 DIAGNOSIS — W19XXXA Unspecified fall, initial encounter: Secondary | ICD-10-CM | POA: Diagnosis not present

## 2021-08-06 DIAGNOSIS — R2681 Unsteadiness on feet: Secondary | ICD-10-CM | POA: Diagnosis not present

## 2021-08-06 DIAGNOSIS — M150 Primary generalized (osteo)arthritis: Secondary | ICD-10-CM | POA: Diagnosis not present

## 2021-08-06 DIAGNOSIS — R278 Other lack of coordination: Secondary | ICD-10-CM | POA: Diagnosis not present

## 2021-08-06 DIAGNOSIS — W19XXXA Unspecified fall, initial encounter: Secondary | ICD-10-CM | POA: Diagnosis not present

## 2021-08-06 DIAGNOSIS — M6281 Muscle weakness (generalized): Secondary | ICD-10-CM | POA: Diagnosis not present

## 2021-08-07 DIAGNOSIS — M6281 Muscle weakness (generalized): Secondary | ICD-10-CM | POA: Diagnosis not present

## 2021-08-07 DIAGNOSIS — M150 Primary generalized (osteo)arthritis: Secondary | ICD-10-CM | POA: Diagnosis not present

## 2021-08-07 DIAGNOSIS — R2681 Unsteadiness on feet: Secondary | ICD-10-CM | POA: Diagnosis not present

## 2021-08-07 DIAGNOSIS — W19XXXA Unspecified fall, initial encounter: Secondary | ICD-10-CM | POA: Diagnosis not present

## 2021-08-07 DIAGNOSIS — R278 Other lack of coordination: Secondary | ICD-10-CM | POA: Diagnosis not present

## 2021-08-08 DIAGNOSIS — M6281 Muscle weakness (generalized): Secondary | ICD-10-CM | POA: Diagnosis not present

## 2021-08-08 DIAGNOSIS — R2681 Unsteadiness on feet: Secondary | ICD-10-CM | POA: Diagnosis not present

## 2021-08-08 DIAGNOSIS — R278 Other lack of coordination: Secondary | ICD-10-CM | POA: Diagnosis not present

## 2021-08-08 DIAGNOSIS — M150 Primary generalized (osteo)arthritis: Secondary | ICD-10-CM | POA: Diagnosis not present

## 2021-08-08 DIAGNOSIS — W19XXXA Unspecified fall, initial encounter: Secondary | ICD-10-CM | POA: Diagnosis not present

## 2021-08-14 DIAGNOSIS — M6281 Muscle weakness (generalized): Secondary | ICD-10-CM | POA: Diagnosis not present

## 2021-08-14 DIAGNOSIS — M150 Primary generalized (osteo)arthritis: Secondary | ICD-10-CM | POA: Diagnosis not present

## 2021-08-14 DIAGNOSIS — R278 Other lack of coordination: Secondary | ICD-10-CM | POA: Diagnosis not present

## 2021-08-14 DIAGNOSIS — R2681 Unsteadiness on feet: Secondary | ICD-10-CM | POA: Diagnosis not present

## 2021-08-15 DIAGNOSIS — R278 Other lack of coordination: Secondary | ICD-10-CM | POA: Diagnosis not present

## 2021-08-15 DIAGNOSIS — M6281 Muscle weakness (generalized): Secondary | ICD-10-CM | POA: Diagnosis not present

## 2021-08-15 DIAGNOSIS — M150 Primary generalized (osteo)arthritis: Secondary | ICD-10-CM | POA: Diagnosis not present

## 2021-08-15 DIAGNOSIS — R2681 Unsteadiness on feet: Secondary | ICD-10-CM | POA: Diagnosis not present

## 2021-08-16 DIAGNOSIS — R2681 Unsteadiness on feet: Secondary | ICD-10-CM | POA: Diagnosis not present

## 2021-08-16 DIAGNOSIS — M150 Primary generalized (osteo)arthritis: Secondary | ICD-10-CM | POA: Diagnosis not present

## 2021-08-16 DIAGNOSIS — R278 Other lack of coordination: Secondary | ICD-10-CM | POA: Diagnosis not present

## 2021-08-16 DIAGNOSIS — M6281 Muscle weakness (generalized): Secondary | ICD-10-CM | POA: Diagnosis not present

## 2021-08-17 DIAGNOSIS — R2681 Unsteadiness on feet: Secondary | ICD-10-CM | POA: Diagnosis not present

## 2021-08-17 DIAGNOSIS — M6281 Muscle weakness (generalized): Secondary | ICD-10-CM | POA: Diagnosis not present

## 2021-08-17 DIAGNOSIS — R278 Other lack of coordination: Secondary | ICD-10-CM | POA: Diagnosis not present

## 2021-08-17 DIAGNOSIS — M150 Primary generalized (osteo)arthritis: Secondary | ICD-10-CM | POA: Diagnosis not present

## 2021-08-19 ENCOUNTER — Encounter: Payer: Self-pay | Admitting: Nurse Practitioner

## 2021-08-19 ENCOUNTER — Non-Acute Institutional Stay (SKILLED_NURSING_FACILITY): Payer: Medicare HMO | Admitting: Nurse Practitioner

## 2021-08-19 DIAGNOSIS — I872 Venous insufficiency (chronic) (peripheral): Secondary | ICD-10-CM

## 2021-08-19 DIAGNOSIS — R569 Unspecified convulsions: Secondary | ICD-10-CM | POA: Diagnosis not present

## 2021-08-19 DIAGNOSIS — I10 Essential (primary) hypertension: Secondary | ICD-10-CM | POA: Diagnosis not present

## 2021-08-19 DIAGNOSIS — I2510 Atherosclerotic heart disease of native coronary artery without angina pectoris: Secondary | ICD-10-CM

## 2021-08-19 DIAGNOSIS — N138 Other obstructive and reflux uropathy: Secondary | ICD-10-CM

## 2021-08-19 DIAGNOSIS — Z8673 Personal history of transient ischemic attack (TIA), and cerebral infarction without residual deficits: Secondary | ICD-10-CM

## 2021-08-19 DIAGNOSIS — I4891 Unspecified atrial fibrillation: Secondary | ICD-10-CM | POA: Diagnosis not present

## 2021-08-19 DIAGNOSIS — I5032 Chronic diastolic (congestive) heart failure: Secondary | ICD-10-CM

## 2021-08-19 DIAGNOSIS — K219 Gastro-esophageal reflux disease without esophagitis: Secondary | ICD-10-CM

## 2021-08-19 DIAGNOSIS — F5101 Primary insomnia: Secondary | ICD-10-CM | POA: Diagnosis not present

## 2021-08-19 DIAGNOSIS — M159 Polyosteoarthritis, unspecified: Secondary | ICD-10-CM | POA: Diagnosis not present

## 2021-08-19 DIAGNOSIS — R69 Illness, unspecified: Secondary | ICD-10-CM | POA: Diagnosis not present

## 2021-08-19 DIAGNOSIS — N401 Enlarged prostate with lower urinary tract symptoms: Secondary | ICD-10-CM

## 2021-08-19 NOTE — Assessment & Plan Note (Signed)
takes Omeprazole. Hgb 14.1 07/18/21 ?

## 2021-08-19 NOTE — Assessment & Plan Note (Signed)
blood pressure is controled on amlodipine, Lisinopril. Bun/creat 16/1.9 07/18/21 ?

## 2021-08-19 NOTE — Assessment & Plan Note (Signed)
stable, off Trazodone.  

## 2021-08-19 NOTE — Assessment & Plan Note (Signed)
Hx of CAD, hyperlipidemia, on Crestor, ASA. LDL 61 07/18/21.  10/09/20 EKG SR vent rate 65, no significant ST-T changes.  

## 2021-08-19 NOTE — Assessment & Plan Note (Addendum)
Chronic lower back pain/right hip pain,  on Gabapentin, s/p left hip surgery, w/c helps, prn Tylenol ?

## 2021-08-19 NOTE — Assessment & Plan Note (Signed)
Stable, takes Keppra '500mg'$  bid. F/u neurology  ?

## 2021-08-19 NOTE — Progress Notes (Signed)
Location:   [facility] Room Number: 61-A Place of Service:  SNF (31) Provider:  Bennie Scaff, NP    Patient Care Team: [name], MD as PCP - General (Internal Medicine) Gatha Mayer, MD as Consulting Physician (Gastroenterology) [name], [name], MD as Consulting Physician (Cardiology) [name], MD as Attending Physician (Internal Medicine) Fay Swider X, NP as Nurse Practitioner (Internal Medicine)  Extended Emergency Contact Information Primary Emergency Contact: Brayboy,[name] Address: [place], [place] [address] [name] of [place] Phone: [phone] Mobile Phone: [phone] Relation: Daughter Secondary Emergency Contact: Arras,[name] Address: [place] [address] [name] of [place] Phone: [phone] Mobile Phone: [phone] Relation: Son  Code Status:  DNR Goals of care: Advanced Directive information Advanced Directives 08/19/2021  Does Patient Have a Medical Advance Directive? Yes  Type of Paramedic of [place];Living will;Out of facility DNR (pink MOST or yellow form)  Does patient want to make changes to medical advance directive? No - Patient declined  Copy of [place] in Chart? Yes - validated most recent copy scanned in chart (See row information)  Pre-existing out of facility DNR order (yellow form or pink MOST form) -     Chief Complaint  Patient presents with   Medical Management of Chronic Issues    Routine Visit.    HPI:  Pt is a 88 y.o. male seen today for medical management of chronic diseases.        TIA, resolved right hand numbness, weakness, difficulty feeding self, changed to ASA '325mg'$  qd from '81mg'$  11/13/20, on Statin, declined neurology consultation.              Recurrent falls, due to unsteady on his legs, attempting standing up or transfer with no assistance.               BPH, saw urology. Chronic cystitis              PVD, BLE brownish erythematous pigmentation, swelling, no open wounds.              Afib, onset 09/2019, heart rate is conrolled, not a candidate for anticoagulation             CHF, EF 46-50%, grade 1 diastolic dysfunction, on Furosemide, saw Cardiology             HTN, blood pressure is controled on amlodipine, Lisinopril. Bun/creat 16/1.9 07/18/21             Hx of CAD, hyperlipidemia, on Crestor, ASA. LDL 61 07/18/21.  10/09/20 EKG SR vent rate 65, no significant ST-T changes.              GERD, takes Omeprazole. Hgb 14.1 07/18/21             Insomnia, stable, off Trazodone.              Chronic lower back pain/right hip pain,  on Gabapentin              OA, s/p left hip surgery, chronic right hip pain, w/c helps, prn Tylenol             Seizure: takes Keppra '500mg'$  bid. F/u neurology       Past Medical History:  Diagnosis Date   Allergic rhinitis    Amnesia  Atherosclerotic heart disease of native coronary artery without angina pectoris    BPH (benign prostatic hyperplasia)    Bradycardia    Chronic diastolic (congestive) heart failure (HCC)    Colon polyps    Coronary artery disease    MODERATE   Dizziness    Edema of lower extremity    Erectile dysfunction    H. pylori infection    Hx of    Hard of hearing    Headache(784.0)    History of colon polyps 2009   Dr. Orr/Colonoscopy -small cecal adenoma   History of falling    Hx of colonoscopy 2009   Dr Lajoyce Corners, hemorrhoids & polyps   Hyperlipidemia    Hypertension    Hypertensive heart disease with heart failure ([place])    Hypokalemia    Internal hemorrhoids 2009   Dr. Lajoyce Corners Colonoscopy   Metabolic encephalopathy    Mitral valve regurgitation    Muscle weakness (generalized)    Pneumonia    Primary generalized (osteo)arthritis    Reflux esophagitis    Sepsis ([place]) 11/04/2018   Unspecified hemorrhoids    Unsteadiness on feet    Vitamin D  deficiency    Past Surgical History:  Procedure Laterality Date   APPENDECTOMY     CARDIAC CATHETERIZATION  1997   REVEALED MILD TO MODERATE IRREGULARITIES. HIS LEFT EVNTRICULAR SYSTOLIC FUNCTION REVEALED NORMAL EJECTION FRACTION WITH EF OF 70%   CATARACT EXTRACTION, BILATERAL     COLONOSCOPY  multiple   fatty tumor removal     benign   HEMORROIDECTOMY     HIP ARTHROPLASTY Left 08/17/2013   Procedure: ARTHROPLASTY BIPOLAR HIP;  Surgeon: Gearlean Alf, MD;  Location: [facility];  Service: Orthopedics;  Laterality: Left;   INGUINAL HERNIA REPAIR Bilateral 2006   lower back surgery  2006   NASAL SEPTUM SURGERY     TONSILLECTOMY     UPPER GASTROINTESTINAL ENDOSCOPY      No Known Allergies  Allergies as of 08/19/2021   No Known Allergies      Medication List        Accurate as of August 19, 2021 11:59 PM. If you have any questions, ask your nurse or doctor.          acetaminophen 500 MG tablet Commonly known as: TYLENOL Take 500 mg by mouth every 8 (eight) hours as needed.   amLODipine 5 MG tablet Commonly known as: NORVASC Take 5 mg by mouth daily.   ammonium lactate 12 % cream Commonly known as: AMLACTIN Apply 1 application topically at bedtime.   aspirin 325 MG tablet Take 325 mg by mouth daily.   CeraVe Crea Apply topically at bedtime. To lower extremities @ bedtime   DEXTRAN 70-HYPROMELLOSE OP Apply 1 drop to eye 3 (three) times daily as needed. Special Instructions: as needed for dry eyes/itching   furosemide 40 MG tablet Commonly known as: LASIX Take 40 mg by mouth daily.   hydrocortisone 2.5 % rectal cream Commonly known as: ANUSOL-HC Place 1 application rectally 2 (two) times daily as needed for anal itching or hemorrhoids (USE PERINEAL APPLICATOR). With perineal applicator   levETIRAcetam 500 MG tablet Commonly known as: KEPPRA Take 1 tablet (500 mg total) by mouth 2 (two) times daily.   lisinopril 40 MG tablet Commonly known as:  ZESTRIL Take 40 mg by mouth daily.   omeprazole 20 MG capsule Commonly known as: PRILOSEC Take 20 mg by mouth daily.   potassium chloride SA 20 MEQ tablet Commonly known  asRhetta Mura Take 20 mEq by mouth daily.   rosuvastatin 5 MG tablet Commonly known as: CRESTOR Take 5 mg by mouth daily.   Thera-M Tabs Take 1 tablet by mouth daily.   vitamin B-12 1000 MCG tablet Commonly known as: CYANOCOBALAMIN Take 1,000 mcg by mouth daily.        Review of Systems  Constitutional:  Positive for unexpected weight change. Negative for fatigue and fever.       Weight gained about #4Ibs in the past month.   HENT:  Positive for hearing loss. Negative for congestion and voice change.   Eyes:  Negative for visual disturbance.  Respiratory:  Negative for cough, shortness of breath and wheezing.   Cardiovascular:  Positive for leg swelling.  Gastrointestinal:  Negative for abdominal pain and constipation.  Genitourinary:  Positive for frequency. Negative for difficulty urinating and urgency.  Musculoskeletal:  Positive for arthralgias, back pain and gait problem.       Lower back, right hip/thigh pain   Skin:  Negative for color change.       Chronic venous insufficiency skin changes BLE.  Neurological:  Negative for seizures, weakness and headaches.       Memory lapses. Tingling in toes. Right hand weakness/numbness is improved, placed ASA 325 11/13/20  Psychiatric/Behavioral:  Negative for behavioral problems and sleep disturbance. The patient is not nervous/anxious.    Immunization History  Administered Date(s) Administered   Influenza, High Dose Seasonal PF 03/19/2019, 03/19/2019   Influenza-Unspecified 04/08/2016, 03/27/2020, 04/03/2021   Moderna Sars-Covid-2 Vaccination 06/18/2019, 07/16/2019, 04/24/2020, 11/13/2020   Pfizer Covid-19 Vaccine Bivalent Booster 7yr & up 03/05/2021   Pneumococcal Conjugate-13 06/02/2013   Pneumococcal Polysaccharide-23 04/07/2001   Tetanus  08/14/2013   Zoster Recombinat (Shingrix) 08/03/2021   Pertinent  Health Maintenance Due  Topic Date Due   INFLUENZA VACCINE  Completed   Fall Risk 09/21/2019 09/22/2019 09/22/2019 09/23/2019 01/12/2020  Falls in the past year? - - - - -  Was there an injury with Fall? - - - - -  Was there an injury with Fall? - - - - -  Patient Fall Risk Level High fall risk High fall risk High fall risk High fall risk High fall risk  Fall risk Follow up - - - - -   Functional Status Survey:    Vitals:   08/19/21 1606  BP: (!) 154/70  Pulse: 74  Resp: 18  Temp: 98.8 F (37.1 C)  SpO2: 94%  Weight: 186 lb 12.8 oz (84.7 kg)  Height: '5\' 7"'$  (1.702 m)   Body mass index is 29.26 kg/m. Physical Exam Vitals and nursing note reviewed.  Constitutional:      Appearance: Normal appearance.  HENT:     Head: Normocephalic and atraumatic.     Mouth/Throat:     Mouth: Mucous membranes are moist.  Eyes:     Conjunctiva/sclera: Conjunctivae normal.     Pupils: Pupils are equal, round, and reactive to light.  Cardiovascular:     Rate and Rhythm: Normal rate and regular rhythm.     Heart sounds: Murmur heard.  Pulmonary:     Breath sounds: No rales.  Abdominal:     General: Bowel sounds are normal.     Palpations: Abdomen is soft.     Tenderness: There is no abdominal tenderness.  Genitourinary:    Comments: External hemorrhoids, no bleeding or injury.  Musculoskeletal:     Cervical back: Normal range of motion and neck supple. No muscular  tenderness.     Right lower leg: Edema present.     Left lower leg: Edema present.     Comments: Trace edema BLE. W/c for mobility.   Skin:    General: Skin is warm and dry.     Comments: Chronic venous insufficiency skin changes BLE  Neurological:     General: No focal deficit present.     Mental Status: He is alert. Mental status is at baseline.     Motor: No weakness.     Coordination: Coordination normal.     Gait: Gait abnormal.     Comments: Oriented  to person, place.   Psychiatric:        Mood and Affect: Mood normal.        Behavior: Behavior normal.        Thought Content: Thought content normal.    Labs reviewed: Recent Labs    11/14/20 0000 02/05/21 0000 07/18/21 0000  NA 137 137 141  K 4.3 4.0 4.3  CL 102 102 102  CO2 28* 28* 31*  BUN '17 14 16  '$ CREATININE 0.9 0.8 1.0  CALCIUM 9.2 8.7 9.3   Recent Labs    10/09/20 1607 11/14/20 0000 07/18/21 0000  AST '16 18 19  '$ ALT '14 20 17  '$ ALKPHOS 54 63 61  ALBUMIN 3.4* 3.9 3.9   Recent Labs    10/09/20 1607 11/14/20 0000 07/18/21 0000  WBC 8.9 11.5 7.4  NEUTROABS 5,963.00 7,889.00  --   HGB 13.5 14.2 14.1  HCT 39* 43 41  PLT 236 231 223   Lab Results  Component Value Date   TSH 1.77 04/06/2020   No results found for: HGBA1C Lab Results  Component Value Date   CHOL 131 07/18/2021   HDL 57 07/18/2021   LDLCALC 61 07/18/2021   TRIG 57 07/18/2021   CHOLHDL 2.7 02/17/2017    Significant Diagnostic Results in last 30 days:  No results found.  Assessment/Plan Seizure (HCC) Stable, takes Keppra '500mg'$  bid. F/u neurology   Osteoarthritis, multiple sites  Chronic lower back pain/right hip pain,  on Gabapentin, s/p left hip surgery, w/c helps, prn Tylenol  Insomnia  stable, off Trazodone.   GERD (gastroesophageal reflux disease) takes Omeprazole. Hgb 14.1 07/18/21  CAD (coronary artery disease) Hx of CAD, hyperlipidemia, on Crestor, ASA. LDL 61 07/18/21.  10/09/20 EKG SR vent rate 65, no significant ST-T changes.   Hypertension blood pressure is controled on amlodipine, Lisinopril. Bun/creat 16/1.9 02/19/27  Chronic diastolic heart failure (HCC) EF 41-32%, grade 1 diastolic dysfunction, on Furosemide, saw Cardiology  New onset atrial fibrillation ([place]) onset 09/2019, heart rate is conrolled, not a candidate for anticoagulation  Venous insufficiency (chronic) (peripheral) BLE brownish erythematous pigmentation, swelling, no open wounds.   BPH (benign  prostatic hyperplasia) saw urology. Chronic cystitis  History of TIA (transient ischemic attack)  resolved right hand numbness, weakness, difficulty feeding self, changed to ASA '325mg'$  qd from '81mg'$  11/13/20, on Statin, declined neurology consultation.      Family/ staff Communication: plan of care reviewed with the patient and charge nurse.   Labs/tests ordered:  none  Time spend 35 minutes.

## 2021-08-19 NOTE — Assessment & Plan Note (Signed)
onset 09/2019, heart rate is conrolled, not a candidate for anticoagulation ?

## 2021-08-19 NOTE — Assessment & Plan Note (Signed)
EF 65-70%, grade 1 diastolic dysfunction, on Furosemide, saw Cardiology 

## 2021-08-19 NOTE — Assessment & Plan Note (Signed)
resolved right hand numbness, weakness, difficulty feeding self, changed to ASA 325mg qd from 81mg 11/13/20, on Statin, declined neurology consultation 

## 2021-08-19 NOTE — Assessment & Plan Note (Signed)
saw urology. Chronic cystitis 

## 2021-08-19 NOTE — Assessment & Plan Note (Signed)
BLE brownish erythematous pigmentation, swelling, no open wounds.  ?

## 2021-08-20 DIAGNOSIS — R278 Other lack of coordination: Secondary | ICD-10-CM | POA: Diagnosis not present

## 2021-08-20 DIAGNOSIS — M150 Primary generalized (osteo)arthritis: Secondary | ICD-10-CM | POA: Diagnosis not present

## 2021-08-20 DIAGNOSIS — R2681 Unsteadiness on feet: Secondary | ICD-10-CM | POA: Diagnosis not present

## 2021-08-20 DIAGNOSIS — M6281 Muscle weakness (generalized): Secondary | ICD-10-CM | POA: Diagnosis not present

## 2021-08-22 ENCOUNTER — Encounter: Payer: Self-pay | Admitting: Nurse Practitioner

## 2021-08-22 DIAGNOSIS — R2681 Unsteadiness on feet: Secondary | ICD-10-CM | POA: Diagnosis not present

## 2021-08-22 DIAGNOSIS — M6281 Muscle weakness (generalized): Secondary | ICD-10-CM | POA: Diagnosis not present

## 2021-08-22 DIAGNOSIS — M150 Primary generalized (osteo)arthritis: Secondary | ICD-10-CM | POA: Diagnosis not present

## 2021-08-22 DIAGNOSIS — R278 Other lack of coordination: Secondary | ICD-10-CM | POA: Diagnosis not present

## 2021-08-27 DIAGNOSIS — M150 Primary generalized (osteo)arthritis: Secondary | ICD-10-CM | POA: Diagnosis not present

## 2021-08-27 DIAGNOSIS — R2681 Unsteadiness on feet: Secondary | ICD-10-CM | POA: Diagnosis not present

## 2021-08-27 DIAGNOSIS — M6281 Muscle weakness (generalized): Secondary | ICD-10-CM | POA: Diagnosis not present

## 2021-08-27 DIAGNOSIS — R278 Other lack of coordination: Secondary | ICD-10-CM | POA: Diagnosis not present

## 2021-08-29 DIAGNOSIS — M150 Primary generalized (osteo)arthritis: Secondary | ICD-10-CM | POA: Diagnosis not present

## 2021-08-29 DIAGNOSIS — R278 Other lack of coordination: Secondary | ICD-10-CM | POA: Diagnosis not present

## 2021-08-29 DIAGNOSIS — M6281 Muscle weakness (generalized): Secondary | ICD-10-CM | POA: Diagnosis not present

## 2021-08-29 DIAGNOSIS — R2681 Unsteadiness on feet: Secondary | ICD-10-CM | POA: Diagnosis not present

## 2021-09-24 ENCOUNTER — Encounter: Payer: Self-pay | Admitting: Internal Medicine

## 2021-09-24 ENCOUNTER — Non-Acute Institutional Stay (SKILLED_NURSING_FACILITY): Payer: Medicare HMO | Admitting: Internal Medicine

## 2021-09-24 DIAGNOSIS — Z8673 Personal history of transient ischemic attack (TIA), and cerebral infarction without residual deficits: Secondary | ICD-10-CM | POA: Diagnosis not present

## 2021-09-24 DIAGNOSIS — I480 Paroxysmal atrial fibrillation: Secondary | ICD-10-CM

## 2021-09-24 DIAGNOSIS — K219 Gastro-esophageal reflux disease without esophagitis: Secondary | ICD-10-CM | POA: Diagnosis not present

## 2021-09-24 DIAGNOSIS — R569 Unspecified convulsions: Secondary | ICD-10-CM | POA: Diagnosis not present

## 2021-09-24 DIAGNOSIS — I5032 Chronic diastolic (congestive) heart failure: Secondary | ICD-10-CM

## 2021-09-24 DIAGNOSIS — E782 Mixed hyperlipidemia: Secondary | ICD-10-CM

## 2021-09-24 DIAGNOSIS — I2510 Atherosclerotic heart disease of native coronary artery without angina pectoris: Secondary | ICD-10-CM

## 2021-09-24 DIAGNOSIS — I10 Essential (primary) hypertension: Secondary | ICD-10-CM | POA: Diagnosis not present

## 2021-09-24 NOTE — Progress Notes (Signed)
?Location:   [facility] ?Nursing Home Room Number: 061 ?Place of Service:  SNF (31) ?Provider:  [name] MD ? ?[name], MD ? ?Patient Care Team: ?[name], MD as PCP - General (Internal Medicine) ?Gatha Mayer, MD as Consulting Physician (Gastroenterology) ?[name], [name], MD as Consulting Physician (Cardiology) ?[name], MD as Attending Physician (Internal Medicine) ?Mast, Man X, NP as Nurse Practitioner (Internal Medicine) ? ?Extended Emergency Contact Information ?Primary Emergency Contact: Petraitis,[name] ?Address: [address] ?         [place], [place] [address] [place] ?Home Phone: [phone] ?Mobile Phone: [phone] ?Relation: Daughter ?Secondary Emergency Contact: Wishon,[name] ?Address: [address] ?         [place] [address] [place] ?Home Phone: [phone] ?Mobile Phone: [phone] ?Relation: Son ? ?Code Status:  DNR Managed Care ?Goals of care: Advanced Directive information ? ?  09/24/2021  ? 10:15 AM  ?Advanced Directives  ?Does Patient Have a Medical Advance Directive? Yes  ?Type of Paramedic of [place];Living will;Out of facility DNR (pink MOST or yellow form)  ?Does patient want to make changes to medical advance directive? No - Patient declined  ?Copy of [place] in Chart? Yes - validated most recent copy scanned in chart (See row information)  ? ? ? ?Chief Complaint  ?Patient presents with  ? Medical Management of Chronic Issues  ? ? ?HPI:  ?Pt is a 88 y.o. male seen today for medical management of chronic diseases.   ? ?Resident of SNF ? Patient has a history of BPH, bradycardia, ? CAD, hypertension, diastolic CHF, ? GERD, ?H/o Left Sphenoid Meningioma And Chronic Seizures ? ? TIA ?Is now on Higher dose of Aspirin per his request ? LLE wound now healed ? ?Continues to be stable ?No New Issue today ?Per nurses no behavior issues ?Wt Readings from  Last 3 Encounters:  ?09/24/21 189 lb 11.2 oz (86 kg)  ?08/19/21 186 lb 12.8 oz (84.7 kg)  ?07/22/21 182 lb 8 oz (82.8 kg)  ?  ?Is gaining Weight ?Eats well. Mostly stays in his wheelchair ?Can do His transfers ?Past Medical History:  ?Diagnosis Date  ? Allergic rhinitis   ? Amnesia   ? Atherosclerotic heart disease of native coronary artery without angina pectoris   ? BPH (benign prostatic hyperplasia)   ? Bradycardia   ? Chronic diastolic (congestive) heart failure (HCC)   ? Colon polyps   ? Coronary artery disease   ? MODERATE  ? Dizziness   ? Edema of lower extremity   ? Erectile dysfunction   ? H. pylori infection   ? Hx of   ? Hard of hearing   ? Headache(784.0)   ? History of colon polyps 2009  ? Dr. Orr/Colonoscopy -small cecal adenoma  ? History of falling   ? Hx of colonoscopy 2009  ? Dr Lajoyce Corners, hemorrhoids & polyps  ? Hyperlipidemia   ? Hypertension   ? Hypertensive heart disease with heart failure ([place])   ? Hypokalemia   ? Internal hemorrhoids 2009  ? Dr. Lajoyce Corners Colonoscopy  ? Metabolic encephalopathy   ? Mitral valve regurgitation   ? Muscle weakness (generalized)   ? Pneumonia   ? Primary generalized (osteo)arthritis   ? Reflux esophagitis   ? Sepsis ([place]) 11/04/2018  ? Unspecified hemorrhoids   ? Unsteadiness on feet   ? Vitamin D deficiency   ? ?Past Surgical History:  ?  Procedure Laterality Date  ? APPENDECTOMY    ? CARDIAC CATHETERIZATION  1997  ? REVEALED MILD TO MODERATE IRREGULARITIES. HIS LEFT EVNTRICULAR SYSTOLIC FUNCTION REVEALED NORMAL EJECTION FRACTION WITH EF OF 70%  ? CATARACT EXTRACTION, BILATERAL    ? COLONOSCOPY  multiple  ? fatty tumor removal    ? benign  ? HEMORROIDECTOMY    ? HIP ARTHROPLASTY Left 08/17/2013  ? Procedure: ARTHROPLASTY BIPOLAR HIP;  Surgeon: Gearlean Alf, MD;  Location: [facility];  Service: Orthopedics;  Laterality: Left;  ? INGUINAL HERNIA REPAIR Bilateral 2006  ? lower back surgery  2006  ? NASAL SEPTUM SURGERY    ? TONSILLECTOMY    ? UPPER GASTROINTESTINAL ENDOSCOPY     ? ? ?No Known Allergies ? ?Allergies as of 09/24/2021   ?No Known Allergies ?  ? ?  ?Medication List  ?  ? ?  ? Accurate as of September 24, 2021 11:59 PM. If you have any questions, ask your nurse or doctor.  ?  ?  ? ?  ? ?acetaminophen 500 MG tablet ?Commonly known as: TYLENOL ?Take 500 mg by mouth every 8 (eight) hours as needed. ?  ?amLODipine 5 MG tablet ?Commonly known as: NORVASC ?Take 5 mg by mouth daily. ?  ?ammonium lactate 12 % cream ?Commonly known as: AMLACTIN ?Apply 1 application topically at bedtime. ?  ?aspirin 325 MG tablet ?Take 325 mg by mouth daily. ?  ?CeraVe Crea ?Apply topically at bedtime. To lower extremities @ bedtime ?  ?DEXTRAN 70-HYPROMELLOSE OP ?Apply 1 drop to eye 3 (three) times daily as needed. Special Instructions: as needed for dry eyes/itching ?  ?furosemide 40 MG tablet ?Commonly known as: LASIX ?Take 40 mg by mouth daily. ?  ?hydrocortisone 2.5 % rectal cream ?Commonly known as: ANUSOL-HC ?Place 1 application rectally 2 (two) times daily as needed for anal itching or hemorrhoids (USE PERINEAL APPLICATOR). With perineal applicator ?  ?levETIRAcetam 500 MG tablet ?Commonly known as: KEPPRA ?Take 1 tablet (500 mg total) by mouth 2 (two) times daily. ?  ?lisinopril 40 MG tablet ?Commonly known as: ZESTRIL ?Take 40 mg by mouth daily. ?  ?omeprazole 20 MG capsule ?Commonly known as: PRILOSEC ?Take 20 mg by mouth daily. ?  ?potassium chloride SA 20 MEQ tablet ?Commonly known as: KLOR-CON M ?Take 20 mEq by mouth daily. ?  ?rosuvastatin 5 MG tablet ?Commonly known as: CRESTOR ?Take 5 mg by mouth daily. ?  ?Thera-M Tabs ?Take 1 tablet by mouth daily. ?  ?vitamin B-12 1000 MCG tablet ?Commonly known as: CYANOCOBALAMIN ?Take 1,000 mcg by mouth daily. ?  ? ?  ? ? ?Review of Systems  ?Constitutional:  Negative for activity change, appetite change and unexpected weight change.  ?HENT: Negative.    ?Respiratory:  Negative for cough and shortness of breath.   ?Cardiovascular:  Positive for leg  swelling.  ?Gastrointestinal:  Negative for constipation.  ?Genitourinary:  Positive for frequency.  ?Musculoskeletal:  Positive for gait problem. Negative for arthralgias and myalgias.  ?Skin: Negative.  Negative for rash.  ?Neurological:  Negative for dizziness and weakness.  ?Psychiatric/Behavioral:  Negative for confusion and sleep disturbance.   ?All other systems reviewed and are negative. ? ?Immunization History  ?Administered Date(s) Administered  ? Influenza, High Dose Seasonal PF 03/19/2019, 03/19/2019  ? Influenza-Unspecified 04/08/2016, 03/27/2020, 04/03/2021  ? Moderna Sars-Covid-2 Vaccination 06/18/2019, 07/16/2019, 04/24/2020, 11/13/2020  ? Pension scheme manager 73yr & up 03/05/2021  ? Pneumococcal Conjugate-13 06/02/2013  ? Pneumococcal Polysaccharide-23 04/07/2001  ?  Tetanus 08/14/2013  ? Zoster Recombinat (Shingrix) 08/03/2021  ? ?Pertinent  Health Maintenance Due  ?Topic Date Due  ? INFLUENZA VACCINE  01/14/2022  ? ? ?  09/21/2019  ?  8:15 PM 09/22/2019  ?  7:30 AM 09/22/2019  ?  9:00 PM 09/23/2019  ?  9:30 AM 01/12/2020  ?  3:02 AM  ?Fall Risk  ?Patient Fall Risk Level High fall risk High fall risk High fall risk High fall risk High fall risk  ? ?Functional Status Survey: ?  ? ?Vitals:  ? 09/24/21 1008  ?BP: 132/78  ?Pulse: 60  ?Resp: 18  ?Temp: 97.6 ?F (36.4 ?C)  ?SpO2: 98%  ?Weight: 189 lb 11.2 oz (86 kg)  ?Height: '5\' 7"'$  (1.702 m)  ? ?Body mass index is 29.71 kg/m?Marland Kitchen ?Physical Exam ?Vitals reviewed.  ?Constitutional:   ?   Appearance: Normal appearance.  ?HENT:  ?   Head: Normocephalic.  ?   Nose: Nose normal.  ?   Mouth/Throat:  ?   Mouth: Mucous membranes are moist.  ?   Pharynx: Oropharynx is clear.  ?Eyes:  ?   Pupils: Pupils are equal, round, and reactive to light.  ?Cardiovascular:  ?   Rate and Rhythm: Normal rate and regular rhythm.  ?   Pulses: Normal pulses.  ?   Heart sounds: No murmur heard. ?Pulmonary:  ?   Effort: Pulmonary effort is normal. No respiratory distress.  ?    Breath sounds: Normal breath sounds. No rales.  ?Abdominal:  ?   General: Abdomen is flat. Bowel sounds are normal.  ?   Palpations: Abdomen is soft.  ?Musculoskeletal:     ?   General: Swelling present.  ?

## 2021-10-01 ENCOUNTER — Encounter: Payer: Self-pay | Admitting: Internal Medicine

## 2021-10-08 ENCOUNTER — Non-Acute Institutional Stay (SKILLED_NURSING_FACILITY): Payer: Medicare HMO | Admitting: Internal Medicine

## 2021-10-08 ENCOUNTER — Encounter: Payer: Self-pay | Admitting: Internal Medicine

## 2021-10-08 DIAGNOSIS — L03115 Cellulitis of right lower limb: Secondary | ICD-10-CM

## 2021-10-08 DIAGNOSIS — I5032 Chronic diastolic (congestive) heart failure: Secondary | ICD-10-CM

## 2021-10-08 NOTE — Progress Notes (Signed)
?Location:   [facility]  ?Nursing Home Room Number: 20 ?Place of Service:  SNF (31) ?Provider:  [name] MD  ? ?[name], MD ? ?Patient Care Team: ?[name], MD as PCP - General (Internal Medicine) ?Gatha Mayer, MD as Consulting Physician (Gastroenterology) ?[name], [name], MD as Consulting Physician (Cardiology) ?[name], MD as Attending Physician (Internal Medicine) ?Mast, Man X, NP as Nurse Practitioner (Internal Medicine) ? ?Extended Emergency Contact Information ?Primary Emergency Contact: Ohman,[name] ?Address: [address] ?         [place], [place] [address] [place] ?Home Phone: [phone] ?Mobile Phone: [phone] ?Relation: Daughter ?Secondary Emergency Contact: Evitts,[name] ?Address: [address] ?         [place] [address] [place] ?Home Phone: [phone] ?Mobile Phone: [phone] ?Relation: Son ? ?Code Status:  DNR Managed Care ?Goals of care: Advanced Directive information ? ?  10/08/2021  ? 10:28 AM  ?Advanced Directives  ?Does Patient Have a Medical Advance Directive? Yes  ?Type of Paramedic of [place];Living will;Out of facility DNR (pink MOST or yellow form)  ?Does patient want to make changes to medical advance directive? No - Patient declined  ?Copy of [place] in Chart? Yes - validated most recent copy scanned in chart (See row information)  ? ? ? ?Chief Complaint  ?Patient presents with  ? Acute Visit  ?  LE edema and Cellulitis   ? ? ?HPI:  ?Pt is a 88 y.o. male seen today for an acute visit for Lower extremity Edema and Blisters most likely Water blisters ? ?Resident of SNF ? Patient has a history of BPH, bradycardia, ? CAD, hypertension, diastolic CHF, ? GERD, ?H/o Left Sphenoid Meningioma And Chronic Seizures ? ? TIA ?Is now on Higher dose of Aspirin per his request ? LLE wound now healed ? ?Was seen today for Big blisters in his Right legs.  There was one big blister in front which later opened and small in the back ?The blisters seem filled with Clear discharge ?The leg has swelling and redness but not tender ?Patient does not have fever ir chills ?Nurses says they just noticed it today ? ?Has gained 9 lbs since 01/23 ?Denies any SOB or Cough ? ? ?Past Medical History:  ?Diagnosis Date  ? Allergic rhinitis   ? Amnesia   ? Atherosclerotic heart disease of native coronary artery without angina pectoris   ? BPH (benign prostatic hyperplasia)   ? Bradycardia   ? Chronic diastolic (congestive) heart failure (HCC)   ? Colon polyps   ? Coronary artery disease   ? MODERATE  ? Dizziness   ? Edema of lower extremity   ? Erectile dysfunction   ? H. pylori infection   ? Hx of   ? Hard of hearing   ? Headache(784.0)   ? History of colon polyps 2009  ? Dr. Orr/Colonoscopy -small cecal adenoma  ? History of falling   ? Hx of colonoscopy 2009  ? Dr Lajoyce Corners, hemorrhoids & polyps  ? Hyperlipidemia   ? Hypertension   ? Hypertensive heart disease with heart failure ([place])   ? Hypokalemia   ? Internal hemorrhoids 2009  ? Dr. Lajoyce Corners Colonoscopy  ? Metabolic encephalopathy   ? Mitral valve regurgitation   ? Muscle weakness (generalized)   ? Pneumonia   ? Primary generalized (osteo)arthritis   ? Reflux esophagitis   ? Sepsis ([place]) 11/04/2018  ?  Unspecified hemorrhoids   ? Unsteadiness on feet   ? Vitamin D deficiency   ? ?Past Surgical History:  ?Procedure Laterality Date  ? APPENDECTOMY    ? CARDIAC CATHETERIZATION  1997  ? REVEALED MILD TO MODERATE IRREGULARITIES. HIS LEFT EVNTRICULAR SYSTOLIC FUNCTION REVEALED NORMAL EJECTION FRACTION WITH EF OF 70%  ? CATARACT EXTRACTION, BILATERAL    ? COLONOSCOPY  multiple  ? fatty tumor removal    ? benign  ? HEMORROIDECTOMY    ? HIP ARTHROPLASTY Left 08/17/2013  ? Procedure: ARTHROPLASTY BIPOLAR HIP;  Surgeon: Gearlean Alf, MD;  Location: [facility];  Service: Orthopedics;  Laterality: Left;  ? INGUINAL HERNIA REPAIR Bilateral 2006  ? lower  back surgery  2006  ? NASAL SEPTUM SURGERY    ? TONSILLECTOMY    ? UPPER GASTROINTESTINAL ENDOSCOPY    ? ? ?No Known Allergies ? ?Allergies as of 10/08/2021   ?No Known Allergies ?  ? ?  ?Medication List  ?  ? ?  ? Accurate as of October 08, 2021 10:28 AM. If you have any questions, ask your nurse or doctor.  ?  ?  ? ?  ? ?acetaminophen 500 MG tablet ?Commonly known as: TYLENOL ?Take 500 mg by mouth every 8 (eight) hours as needed. ?  ?amLODipine 5 MG tablet ?Commonly known as: NORVASC ?Take 5 mg by mouth daily. ?  ?ammonium lactate 12 % cream ?Commonly known as: AMLACTIN ?Apply 1 application topically at bedtime. ?  ?aspirin 325 MG tablet ?Take 325 mg by mouth daily. ?  ?CeraVe Crea ?Apply topically at bedtime. To lower extremities @ bedtime ?  ?DEXTRAN 70-HYPROMELLOSE OP ?Apply 1 drop to eye 3 (three) times daily as needed. Special Instructions: as needed for dry eyes/itching ?  ?furosemide 40 MG tablet ?Commonly known as: LASIX ?Take 40 mg by mouth daily. ?  ?hydrocortisone 2.5 % rectal cream ?Commonly known as: ANUSOL-HC ?Place 1 application rectally 2 (two) times daily as needed for anal itching or hemorrhoids (USE PERINEAL APPLICATOR). With perineal applicator ?  ?levETIRAcetam 500 MG tablet ?Commonly known as: KEPPRA ?Take 1 tablet (500 mg total) by mouth 2 (two) times daily. ?  ?lisinopril 40 MG tablet ?Commonly known as: ZESTRIL ?Take 40 mg by mouth daily. ?  ?omeprazole 20 MG capsule ?Commonly known as: PRILOSEC ?Take 20 mg by mouth daily. ?  ?potassium chloride SA 20 MEQ tablet ?Commonly known as: KLOR-CON M ?Take 20 mEq by mouth daily. ?  ?rosuvastatin 5 MG tablet ?Commonly known as: CRESTOR ?Take 5 mg by mouth daily. ?  ?Thera-M Tabs ?Take 1 tablet by mouth daily. ?  ?vitamin B-12 1000 MCG tablet ?Commonly known as: CYANOCOBALAMIN ?Take 1,000 mcg by mouth daily. ?  ? ?  ? ? ?Review of Systems  ?Constitutional:  Negative for activity change, appetite change and unexpected weight change.  ?HENT: Negative.     ?Respiratory:  Negative for cough and shortness of breath.   ?Cardiovascular:  Positive for leg swelling.  ?Gastrointestinal:  Negative for constipation.  ?Genitourinary:  Positive for frequency.  ?Musculoskeletal:  Positive for gait problem. Negative for arthralgias and myalgias.  ?Skin:  Positive for color change. Negative for rash.  ?Neurological:  Negative for dizziness and weakness.  ?Psychiatric/Behavioral:  Negative for confusion and sleep disturbance.   ?All other systems reviewed and are negative. ? ?Immunization History  ?Administered Date(s) Administered  ? Influenza, High Dose Seasonal PF 03/19/2019, 03/19/2019  ? Influenza-Unspecified 04/08/2016, 03/27/2020, 04/03/2021  ? Moderna Sars-Covid-2 Vaccination 06/18/2019,  07/16/2019, 04/24/2020, 11/13/2020  ? Pension scheme manager 17yr & up 03/05/2021  ? Pneumococcal Conjugate-13 06/02/2013  ? Pneumococcal Polysaccharide-23 04/07/2001  ? Tetanus 08/14/2013  ? Zoster Recombinat (Shingrix) 08/03/2021  ? ?Pertinent  Health Maintenance Due  ?Topic Date Due  ? INFLUENZA VACCINE  01/14/2022  ? ? ?  09/21/2019  ?  8:15 PM 09/22/2019  ?  7:30 AM 09/22/2019  ?  9:00 PM 09/23/2019  ?  9:30 AM 01/12/2020  ?  3:02 AM  ?Fall Risk  ?Patient Fall Risk Level High fall risk High fall risk High fall risk High fall risk High fall risk  ? ?Functional Status Survey: ?  ? ?Vitals:  ? 10/08/21 1023  ?BP: 132/78  ?Pulse: 67  ?Resp: 16  ?Temp: (!) 97 ?F (36.1 ?C)  ?SpO2: 93%  ?Weight: 189 lb 11.2 oz (86 kg)  ?Height: '5\' 7"'$  (1.702 m)  ? ?Body mass index is 29.71 kg/m?.Marland Kitchen?Physical Exam ?Vitals reviewed.  ?Constitutional:   ?   Appearance: Normal appearance.  ?HENT:  ?   Head: Normocephalic.  ?   Nose: Nose normal.  ?   Mouth/Throat:  ?   Mouth: Mucous membranes are moist.  ?   Pharynx: Oropharynx is clear.  ?Eyes:  ?   Pupils: Pupils are equal, round, and reactive to light.  ?Cardiovascular:  ?   Rate and Rhythm: Normal rate and regular rhythm.  ?   Pulses: Normal pulses.  ?    Heart sounds: No murmur heard. ?Pulmonary:  ?   Effort: Pulmonary effort is normal. No respiratory distress.  ?   Breath sounds: Normal breath sounds. No rales.  ?Abdominal:  ?   General: Abdomen is

## 2021-10-15 DIAGNOSIS — E876 Hypokalemia: Secondary | ICD-10-CM | POA: Diagnosis not present

## 2021-10-15 DIAGNOSIS — I10 Essential (primary) hypertension: Secondary | ICD-10-CM | POA: Diagnosis not present

## 2021-10-15 LAB — BASIC METABOLIC PANEL
BUN: 35 — AB (ref 4–21)
CO2: 24 — AB (ref 13–22)
Chloride: 106 (ref 99–108)
Creatinine: 1.3 (ref 0.6–1.3)
Glucose: 102
Potassium: 5.1 mEq/L (ref 3.5–5.1)
Sodium: 143 (ref 137–147)

## 2021-10-15 LAB — COMPREHENSIVE METABOLIC PANEL
Calcium: 9.6 (ref 8.7–10.7)
eGFR: 49

## 2021-10-16 ENCOUNTER — Encounter (HOSPITAL_BASED_OUTPATIENT_CLINIC_OR_DEPARTMENT_OTHER): Payer: Medicare HMO | Attending: Physician Assistant | Admitting: Physician Assistant

## 2021-10-16 DIAGNOSIS — L97822 Non-pressure chronic ulcer of other part of left lower leg with fat layer exposed: Secondary | ICD-10-CM | POA: Insufficient documentation

## 2021-10-16 DIAGNOSIS — G40909 Epilepsy, unspecified, not intractable, without status epilepticus: Secondary | ICD-10-CM | POA: Insufficient documentation

## 2021-10-16 DIAGNOSIS — I87333 Chronic venous hypertension (idiopathic) with ulcer and inflammation of bilateral lower extremity: Secondary | ICD-10-CM | POA: Diagnosis not present

## 2021-10-16 DIAGNOSIS — I110 Hypertensive heart disease with heart failure: Secondary | ICD-10-CM | POA: Insufficient documentation

## 2021-10-16 DIAGNOSIS — L97812 Non-pressure chronic ulcer of other part of right lower leg with fat layer exposed: Secondary | ICD-10-CM | POA: Diagnosis not present

## 2021-10-16 DIAGNOSIS — I872 Venous insufficiency (chronic) (peripheral): Secondary | ICD-10-CM | POA: Diagnosis not present

## 2021-10-16 DIAGNOSIS — N400 Enlarged prostate without lower urinary tract symptoms: Secondary | ICD-10-CM | POA: Insufficient documentation

## 2021-10-16 DIAGNOSIS — R0989 Other specified symptoms and signs involving the circulatory and respiratory systems: Secondary | ICD-10-CM | POA: Insufficient documentation

## 2021-10-16 DIAGNOSIS — I5042 Chronic combined systolic (congestive) and diastolic (congestive) heart failure: Secondary | ICD-10-CM | POA: Diagnosis not present

## 2021-10-16 DIAGNOSIS — I2510 Atherosclerotic heart disease of native coronary artery without angina pectoris: Secondary | ICD-10-CM | POA: Insufficient documentation

## 2021-10-16 DIAGNOSIS — I10 Essential (primary) hypertension: Secondary | ICD-10-CM | POA: Diagnosis not present

## 2021-10-17 NOTE — Progress Notes (Signed)
[name], [name] ([id number]) ?Visit Report for 10/16/2021 ?Abuse Risk Screen Details ?Patient Name: Date of Service: ?[name], [name] 10/16/2021 8:30 A M ?Medical Record Number: [id number] ?Patient Account Number: [id number] ?Date of Birth/Sex: Treating RN: ?1924-08-06 (88 y.o. [name] [name], [name] ?Primary Care [name] [name]: [name] Other Clinician: ?Referring Laurieann Friddle: ?Treating Batoul Limes/Extender: Worthy Keeler ?[name] ?Weeks in Treatment: 0 ?Abuse Risk Screen Items ?Answer ?ABUSE RISK SCREEN: ?Has anyone close to you tried to hurt or harm you recentlyo No ?Do you feel uncomfortable with anyone in your familyo No ?Has anyone forced you do things that you didnt want to doo No ?Electronic Signature(s) ?Signed: 10/17/2021 4:12:14 PM By: Rhae Hammock RN ?Entered By: Rhae Hammock on 10/16/2021 08:20:08 ?-------------------------------------------------------------------------------- ?Activities of Daily Living Details ?Patient Name: Date of Service: ?[name], [name] 10/16/2021 8:30 A M ?Medical Record Number: [id number] ?Patient Account Number: [id number] ?Date of Birth/Sex: Treating RN: ?07/29/1924 (88 y.o. [name] [name], [name] ?Primary Care Conna Terada: [name] Other Clinician: ?Referring Delorise Hunkele: ?Treating Ilya Ess/Extender: Worthy Keeler ?[name] ?Weeks in Treatment: 0 ?Activities of Daily Living Items ?Answer ?Activities of Daily Living (Please select one for each item) ?Drive Automobile Not Able ?T Medications ?ake Need Assistance ?Use T elephone Need Assistance ?Care for Appearance Need Assistance ?Use T oilet Need Assistance ?Bath / Shower Need Assistance ?Dress Self Need Assistance ?Feed Self Need Assistance ?Walk Need Assistance ?Get In / Out Bed Need Assistance ?Housework Need Assistance ?Prepare Meals Need Assistance ?Handle Money Need Assistance ?Shop for Self Need Assistance ?Electronic Signature(s) ?Signed: 10/17/2021 4:12:14 PM By: Rhae Hammock RN ?Entered By:  Rhae Hammock on 10/16/2021 08:20:28 ?-------------------------------------------------------------------------------- ?Education Screening Details ?Patient Name: ?Date of Service: ?[name], [name] 10/16/2021 8:30 A M ?Medical Record Number: [id number] ?Patient Account Number: [id number] ?Date of Birth/Sex: ?Treating RN: ?1924/11/22 (88 y.o. [name] [name], [name] ?Primary Care Bernell Sigal: [name] ?Other Clinician: ?Referring Reida Hem: ?Treating Aitan Rossbach/Extender: Worthy Keeler ?[name] ?Weeks in Treatment: 0 ?Primary Learner Assessed: Patient ?Learning Preferences/Education Level/Primary Language ?Learning Preference: Explanation, Demonstration, Communication Board, Printed Material ?Highest Education Level: College or Above ?Preferred Language: English ?Cognitive Barrier ?Language Barrier: No ?Translator Needed: No ?Memory Deficit: No ?Emotional Barrier: No ?Cultural/Religious Beliefs Affecting Medical Care: No ?Physical Barrier ?Impaired Vision: No ?Impaired Hearing: No ?Decreased Hand dexterity: No ?Knowledge/Comprehension ?Knowledge Level: High ?Comprehension Level: High ?Ability to understand written instructions: High ?Ability to understand verbal instructions: High ?Motivation ?Anxiety Level: Calm ?Cooperation: Cooperative ?Education Importance: Denies Need ?Interest in Health Problems: Asks Questions ?Perception: Coherent ?Willingness to Engage in Self-Management High ?Activities: ?Readiness to Engage in Self-Management High ?Activities: ?Electronic Signature(s) ?Signed: 10/17/2021 4:12:14 PM By: Rhae Hammock RN ?Entered By: Rhae Hammock on 10/16/2021 08:21:10 ?-------------------------------------------------------------------------------- ?Fall Risk Assessment Details ?Patient Name: ?Date of Service: ?[name], [name] 10/16/2021 8:30 A M ?Medical Record Number: [id number] ?Patient Account Number: [id number] ?Date of Birth/Sex: ?Treating RN: ?[birth year] (88 y.o. [name] [name],  [name] ?Primary Care Gaylene Moylan: [name] ?Other Clinician: ?Referring Javier Mamone: ?Treating Jeffrie Stander/Extender: Worthy Keeler ?[name] ?Weeks in Treatment: 0 ?Fall Risk Assessment Items ?Have you had 2 or more falls in the last 12 monthso 0 No ?Have you had any fall that resulted in injury in the last 12 monthso 0 No ?FALLS RISK SCREEN ?History of falling - immediate or within 3 months 0 No ?Secondary diagnosis (Do you have 2 or more medical diagnoseso) 0 No ?Ambulatory aid ?None/bed rest/wheelchair/nurse 0 No ?Crutches/cane/walker 0 No ?Furniture 0 No ?Intravenous therapy Access/Saline/Heparin Lock 0 No ?Gait/Transferring ?Normal/ bed  rest/ wheelchair 0 No ?Weak (short steps with or without shuffle, stooped but able to lift head while walking, may seek 0 No ?support from furniture) ?Impaired (short steps with shuffle, may have difficulty arising from chair, head down, impaired 0 No ?balance) ?Mental Status ?Oriented to own ability 0 No ?Electronic Signature(s) ?Signed: 10/17/2021 4:12:14 PM By: Rhae Hammock RN ?Entered By: Rhae Hammock on 10/16/2021 08:21:17 ?-------------------------------------------------------------------------------- ?Foot Assessment Details ?Patient Name: ?Date of Service: ?[name], [name] 10/16/2021 8:30 A M ?Medical Record Number: [id number] ?Patient Account Number: [id number] ?Date of Birth/Sex: ?Treating RN: ?Jan 17, 1925 (88 y.o. [name] [name], [name] ?Primary Care Xzavion Doswell: [name] ?Other Clinician: ?Referring Felicitas Sine: ?Treating Hubert Derstine/Extender: Worthy Keeler ?[name] ?Weeks in Treatment: 0 ?Foot Assessment Items ?Site Locations ?+ = Sensation present, - = Sensation absent, C = Callus, U = Ulcer ?R = Redness, W = Warmth, M = Maceration, PU = Pre-ulcerative lesion ?F = Fissure, S = Swelling, D = Dryness ?Assessment ?Right: Left: ?Other Deformity: No No ?Prior Foot Ulcer: No No ?Prior Amputation: No No ?Charcot Joint: No No ?Ambulatory Status: Ambulatory  With Help ?Assistance Device: Wheelchair ?Gait: Unsteady ?Electronic Signature(s) ?Signed: 10/17/2021 4:12:14 PM By: Rhae Hammock RN ?Entered By: Rhae Hammock on 10/16/2021 08:56:07 ?-------------------------------------------------------------------------------- ?Nutrition Risk Screening Details ?Patient Name: ?Date of Service: ?Lightner, [name] 10/16/2021 8:30 A M ?Medical Record Number: [id number] ?Patient Account Number: [id number] ?Date of Birth/Sex: ?Treating RN: ?[birth year] (88 y.o. [name] [name], [name] ?Primary Care Kartel Wolbert: [name] ?Other Clinician: ?Referring Graylee Arutyunyan: ?Treating Callen Zuba/Extender: Worthy Keeler ?[name] ?Weeks in Treatment: 0 ?Height (in): 68 ?Weight (lbs): 170 ?Body Mass Index (BMI): 25.8 ?Nutrition Risk Screening Items ?Score Screening ?NUTRITION RISK SCREEN: ?I have an illness or condition that made me change the kind and/or amount of food I eat 0 No ?I eat fewer than two meals per day 0 No ?I eat few fruits and vegetables, or milk products 0 No ?I have three or more drinks of beer, liquor or wine almost every day 0 No ?I have tooth or mouth problems that make it hard for me to eat 0 No ?I don't always have enough money to buy the food I need 0 No ?I eat alone most of the time 0 No ?I take three or more different prescribed or over-the-counter drugs a day 0 No ?Without wanting to, I have lost or gained 10 pounds in the last six months 0 No ?I am not always physically able to shop, cook and/or feed myself 0 No ?Nutrition Protocols ?Good Risk Protocol 0 No interventions needed ?Moderate Risk Protocol ?High Risk Proctocol ?Risk Level: Good Risk ?Score: 0 ?Electronic Signature(s) ?Signed: 10/17/2021 4:12:14 PM By: Rhae Hammock RN ?Entered By: Rhae Hammock on 10/16/2021 08:21:23 ?

## 2021-10-17 NOTE — Progress Notes (Signed)
TAHJI, [place] ([id number]) ?Visit Report for 10/16/2021 ?Chief Complaint Document Details ?Patient Name: Date of Service: ?Vanduyne, Lyman Bishop M E. 10/16/2021 8:30 A M ?Medical Record Number: [id number] ?Patient Account Number: [id number] ?Date of Birth/Sex: Treating RN: ?28-Jul-1924 (88 y.o. [name], [name] ?Primary Care Provider: [name] Other Clinician: ?Referring Provider: ?Treating Provider/Extender: Worthy Keeler ?[name] ?Weeks in Treatment: 0 ?Information Obtained from: Patient ?Chief Complaint ?Bilateral LE Ulcers ?Electronic Signature(s) ?Signed: 10/16/2021 9:42:57 AM By: Worthy Keeler PA-C ?Entered By: Worthy Keeler on 10/16/2021 09:42:57 ?-------------------------------------------------------------------------------- ?HPI Details ?Patient Name: Date of Service: ?Whiten, Lyman Bishop M E. 10/16/2021 8:30 A M ?Medical Record Number: [id number] ?Patient Account Number: [id number] ?Date of Birth/Sex: Treating RN: ?05/21/1925 (89 y.o. [name], [name] ?Primary Care Provider: [name] Other Clinician: ?Referring Provider: ?Treating Provider/Extender: Worthy Keeler ?[name] ?Weeks in Treatment: 0 ?History of Present Illness ?HPI Description: ADMISSION ?04/02/2021 ?This is a pleasant 88 year old man sent to our clinic by Dr. Lyndel Safe from the skilled unit of [facility]. He has a wound on the right posterior calf. I ?am uncertain about the duration of this however it looks to be chronic. They are using Santyl covered with silver alginate he does not have any compression. ?The wound is 100% covered in very fibrinous adherent slough. It also appears that this is quite painful. The patient is exceptionally hard of hearing so it is ?difficult to get any additional history out of him. ?Past medical history includes hypertension, coronary artery disease, congestive heart failure, BPH, seizure disorder. He is nonambulatory for reasons that are ?not totally clear. He lives at the  sk[facility] unit of [facility]. ?ABI in our clinic was 1.03 on the right ?11/2; 2-week follow-up. He is having the dressing changed at the facility. He arrives in the clinic with the area about the same in terms of surface area but ?better looking surface we have been using Iodoflex under 3 layer compression. Unfortunately we still do not have any history of exactly how long this is been ?there or how it happened. ?11/16; 2-week follow-up. Nice improvement in the wound. He is using Iodoflex over 3 layer compression. ?11/30 2-week follow-up. [facility] is at the [facility] [place]. We have been using Iodoflex under 3 layer compression. They are not wrapping his leg ?properly but nevertheless the wound area has come down nicely. The patient requests coming here every 3 weeks instead of every 2 ?12/21; the area on the posterior right calf is fully epithelialized. The patient has chronic venous insufficiency. ?Readmission: ?10-16-2021 upon evaluation today patient appears to be doing somewhat poorly in regard to the wounds on the bilateral lower extremities. He has been tolerating ?the dressing changes without complication. Fortunately there does not appear to be any evidence of active infection locally or systemically which is great ?news. Overall I am extremely pleased with where things stand. With that being said that obviously he has quite a bit of swelling and this is led to several areas ?of significant blistering over his bilateral lower extremities all the wounds are superficial this is the good news. ?Patient continues to have issues with chronic venous hypertension bilaterally with ulcerations. He also has a history of hypertension and congestive heart ?failure. He previously tolerated 3 layer compression wraps without complication. He was last seen and discharged in our office June 05, 2021. ?Electronic Signature(s) ?Signed: 10/16/2021 2:02:46 PM By: Worthy Keeler PA-C ?Entered By: Worthy Keeler on 10/16/2021 14:02:46 ?-------------------------------------------------------------------------------- ?  Physical Exam Details ?Patient Name: Date of Service: ?Avis, Lyman Bishop M E. 10/16/2021 8:30 A M ?Medical Record Number: [id number] ?Patient Account Number: [id number] ?Date of Birth/Sex: Treating RN: ?Dec 20, 1924 (88 y.o. [name], [name] ?Primary Care Provider: [name] Other Clinician: ?Referring Provider: ?Treating Provider/Extender: Worthy Keeler ?[name] ?Weeks in Treatment: 0 ?Constitutional ?patient is hypertensive.. pulse regular and within target range for patient.Marland Kitchen respirations regular, non-labored and within target range for patient.Marland Kitchen temperature ?within target range for patient.. Well-nourished and well-hydrated in no acute distress. ?Eyes ?conjunctiva clear no eyelid edema noted. pupils equal round and reactive to light and accommodation. ?Ears, Nose, Mouth, and Throat ?no gross abnormality of ear auricles or external auditory canals. patient has hearing loss. mucus membranes moist. ?Respiratory ?normal breathing without difficulty. ?Cardiovascular ?2+ dorsalis pedis/posterior tibialis pulses. no clubbing, cyanosis, significant edema, <3 sec cap refill. ?Musculoskeletal ?normal gait and posture. no significant deformity or arthritic changes, no loss or range of motion, no clubbing. ?Psychiatric ?this patient is able to make decisions and demonstrates good insight into disease process. Alert and Oriented x 3. pleasant and cooperative. ?Notes ?Upon inspection patient's wound bed actually showed signs of good granulation and epithelization for the most part. There did not appear to be any signs of ?significant infection which is great news and overall I am extremely pleased in that regard. I do not see any evidence that the patient overall is worsening and ?he does have excellent blood flow into the extremities. I think the biggest issue is he has not been wearing compression  socks. ?Electronic Signature(s) ?Signed: 10/16/2021 2:04:16 PM By: Worthy Keeler PA-C ?Previous Signature: 10/16/2021 2:03:21 PM Version By: Worthy Keeler PA-C ?Entered By: Worthy Keeler on 10/16/2021 14:04:16 ?-------------------------------------------------------------------------------- ?Physician Orders Details ?Patient Name: Date of Service: ?Barefoot, Lyman Bishop M E. 10/16/2021 8:30 A M ?Medical Record Number: [id number] ?Patient Account Number: [id number] ?Date of Birth/Sex: Treating RN: ?[birth year] (88 y.o. [name], [name] ?Primary Care Provider: [name] Other Clinician: ?Referring Provider: ?Treating Provider/Extender: Worthy Keeler ?[name] ?Weeks in Treatment: 0 ?Verbal / Phone Orders: No ?Diagnosis Coding ?Follow-up Appointments ?ppointment in 1 week. Margarita Grizzle and [facility] Room # 9 ?Return A ?Bathing/ Shower/ Hygiene ?May shower with protection but do not get wound dressing(s) wet. ?Edema Control - Lymphedema / SCD / Other ?Elevate legs to the level of the heart or above for 30 minutes daily and/or when sitting, a frequency of: ?Avoid standing for long periods of time. ?Wound Treatment ?Wound #2 - Lower Leg Wound Laterality: Left, Circumferential ?Cleanser: Soap and Water 1 x Per Week/15 Days ?Discharge Instructions: May shower and wash wound with dial antibacterial soap and water prior to dressing change. ?Cleanser: Wound Cleanser 1 x Per Week/15 Days ?Discharge Instructions: Cleanse the wound with wound cleanser prior to applying a clean dressing using gauze sponges, not tissue or cotton balls. ?Peri-Wound Care: Zinc Oxide Ointment 30g tube 1 x Per Week/15 Days ?Discharge Instructions: Apply Zinc Oxide to periwound with each dressing change ?Peri-Wound Care: Sween Lotion (Moisturizing lotion) 1 x Per Week/15 Days ?Discharge Instructions: Apply moisturizing lotion as directed ?Prim Dressing: KerraCel Ag Gelling Fiber Dressing, 4x5 in (silver alginate) 1 x Per Week/15 Days ?ary ?Discharge  Instructions: Apply silver alginate to wound bed as instructed ?Secondary Dressing: ABD Pad, 5x9 1 x Per Week/15 Days ?Discharge Instructions: Apply over primary dressing as directed. ?Compression Wrap: ThreePress (3 l

## 2021-10-23 ENCOUNTER — Other Ambulatory Visit (HOSPITAL_COMMUNITY)
Admission: RE | Admit: 2021-10-23 | Discharge: 2021-10-23 | Disposition: A | Discharge status: Home / Self Care | Payer: Medicare HMO | Source: Other Acute Inpatient Hospital | Attending: Physician Assistant | Admitting: Physician Assistant

## 2021-10-23 ENCOUNTER — Encounter (HOSPITAL_BASED_OUTPATIENT_CLINIC_OR_DEPARTMENT_OTHER): Payer: Medicare HMO | Admitting: Physician Assistant

## 2021-10-23 DIAGNOSIS — I5042 Chronic combined systolic (congestive) and diastolic (congestive) heart failure: Secondary | ICD-10-CM | POA: Diagnosis not present

## 2021-10-23 DIAGNOSIS — I87331 Chronic venous hypertension (idiopathic) with ulcer and inflammation of right lower extremity: Secondary | ICD-10-CM | POA: Diagnosis not present

## 2021-10-23 DIAGNOSIS — I10 Essential (primary) hypertension: Secondary | ICD-10-CM | POA: Diagnosis not present

## 2021-10-23 DIAGNOSIS — R0989 Other specified symptoms and signs involving the circulatory and respiratory systems: Secondary | ICD-10-CM | POA: Diagnosis not present

## 2021-10-23 DIAGNOSIS — I87333 Chronic venous hypertension (idiopathic) with ulcer and inflammation of bilateral lower extremity: Secondary | ICD-10-CM | POA: Diagnosis not present

## 2021-10-23 DIAGNOSIS — I2510 Atherosclerotic heart disease of native coronary artery without angina pectoris: Secondary | ICD-10-CM | POA: Diagnosis not present

## 2021-10-23 DIAGNOSIS — L97812 Non-pressure chronic ulcer of other part of right lower leg with fat layer exposed: Secondary | ICD-10-CM | POA: Diagnosis not present

## 2021-10-23 DIAGNOSIS — N400 Enlarged prostate without lower urinary tract symptoms: Secondary | ICD-10-CM | POA: Diagnosis not present

## 2021-10-23 DIAGNOSIS — L97822 Non-pressure chronic ulcer of other part of left lower leg with fat layer exposed: Secondary | ICD-10-CM | POA: Diagnosis not present

## 2021-10-23 DIAGNOSIS — I872 Venous insufficiency (chronic) (peripheral): Secondary | ICD-10-CM | POA: Diagnosis not present

## 2021-10-23 DIAGNOSIS — G40909 Epilepsy, unspecified, not intractable, without status epilepticus: Secondary | ICD-10-CM | POA: Diagnosis not present

## 2021-10-23 DIAGNOSIS — I110 Hypertensive heart disease with heart failure: Secondary | ICD-10-CM | POA: Diagnosis not present

## 2021-10-23 NOTE — Progress Notes (Addendum)
[name] [name] ([id number]) ?Visit Report for 10/23/2021 ?Chief Complaint Document Details ?Patient Name: Date of Service: ?[name] [name] M [name]. 10/23/2021 10:00 A M ?Medical Record Number: [id number] ?Patient Account Number: [id number] ?Date of Birth/Sex: Treating RN: ?Sep 02, 1924 (88 y.o. [name], [name] ?Primary Care Provider: [name] Other Clinician: ?Referring Provider: ?Treating Provider/Extender: [name] [name] ?[name] ?Weeks in Treatment: 1 ?Information Obtained from: Patient ?Chief Complaint ?Bilateral LE Ulcers ?Electronic Signature(s) ?Signed: 10/23/2021 10:40:51 AM By: [name] Keeler PA-C ?Entered By: [name] [name] on 10/23/2021 10:40:51 ?-------------------------------------------------------------------------------- ?HPI Details ?Patient Name: Date of Service: ?[name] [name], [name] [name]. 10/23/2021 10:00 A M ?Medical Record Number: [id number] ?Patient Account Number: [id number] ?Date of Birth/Sex: Treating RN: ?1924/12/07 (88 y.o. [name], [name] ?Primary Care Provider: [name] Other Clinician: ?Referring Provider: ?Treating Provider/Extender: [name] [name] ?[name] ?Weeks in Treatment: 1 ?History of Present Illness ?HPI Description: ADMISSION ?04/02/2021 ?This is a pleasant 88 year old man sent to our clinic by Dr. Lyndel Safe from the skilled unit of [facility]. He has a wound on the right posterior calf. I ?am uncertain about the duration of this however it looks to be chronic. They are using Santyl covered with silver alginate he does not have any compression. ?The wound is 100% covered in very fibrinous adherent slough. It also appears that this is quite painful. The patient is exceptionally hard of hearing so it is ?difficult to get any additional history out of him. ?Past medical history includes hypertension, coronary artery disease, congestive heart failure, BPH, seizure disorder. He is nonambulatory for reasons that are ?not totally clear. He lives at the  skilled unit of [facility]. ?ABI in our clinic was 1.03 on the right ?11/2; 2-week follow-up. He is having the dressing changed at the facility. He arrives in the clinic with the area about the same in terms of surface area but ?better looking surface we have been using Iodoflex under 3 layer compression. Unfortunately we still do not have any history of exactly how long this is been ?there or how it happened. ?11/16; 2-week follow-up. Nice improvement in the wound. He is using Iodoflex over 3 layer compression. ?11/30 2-week follow-up. [facility] is at the [facility]. We have been using Iodoflex under 3 layer compression. They are not wrapping his leg ?properly but nevertheless the wound area has come down nicely. The patient requests coming here every 3 weeks instead of every 2 ?12/21; the area on the posterior right calf is fully epithelialized. The patient has chronic venous insufficiency. ?Readmission: ?10-16-2021 upon evaluation today patient appears to be doing somewhat poorly in regard to the wounds on the bilateral lower extremities. He has been tolerating ?the dressing changes without complication. Fortunately there does not appear to be any evidence of active infection locally or systemically which is great ?news. Overall I am extremely pleased with where things stand. With that being said that obviously he has quite a bit of swelling and this is led to several areas ?of significant blistering over his bilateral lower extremities all the wounds are superficial this is the good news. ?Patient continues to have issues with chronic venous hypertension bilaterally with ulcerations. He also has a history of hypertension and congestive heart ?failure. He previously tolerated 3 layer compression wraps without complication. He was last seen and discharged in our office June 05, 2021. ?10-23-2021 upon evaluation today patient appears to be doing excellent in regard to his legs. They do  look better although  there is a lot of bright green drainage I ?do believe this likely represents a Pseudomonas infection and that was discussed with him as best I could today again he is extremely hard of hearing. I had ?to write most of what I wanted him to know down. ?Electronic Signature(s) ?Signed: 10/23/2021 5:56:43 PM By: [name] Keeler PA-C ?Entered By: [name] [name] on 10/23/2021 17:56:43 ?-------------------------------------------------------------------------------- ?Physical Exam Details ?Patient Name: Date of Service: ?[name] [name], [name] [name]. 10/23/2021 10:00 A M ?Medical Record Number: [id number] ?Patient Account Number: [id number] ?Date of Birth/Sex: Treating RN: ?[birth year] (88 y.o. [name], [name] ?Primary Care Provider: [name] Other Clinician: ?Referring Provider: ?Treating Provider/Extender: [name] [name] ?[name] ?Weeks in Treatment: 1 ?Constitutional ?Well-nourished and well-hydrated in no acute distress. ?Respiratory ?normal breathing without difficulty. ?Psychiatric ?this patient is able to make decisions and demonstrates good insight into disease process. Alert and Oriented x 3. pleasant and cooperative. ?Notes ?Upon inspection patient's wound bed actually showed signs of good granulation in a lot of areas he did have some film I was able to clean this way with saline ?and gauze no sharp debridement necessary. I want to be careful with debridement anyway due to the infection obviously present. I do think as well that he is to ?get him on an antibiotic orally. ?Electronic Signature(s) ?Signed: 10/23/2021 5:57:04 PM By: [name] Keeler PA-C ?Entered By: [name] [name] on 10/23/2021 17:57:04 ?-------------------------------------------------------------------------------- ?Physician Orders Details ?Patient Name: Date of Service: ?[name] [name], [name] [name]. 10/23/2021 10:00 A M ?Medical Record Number: [id number] ?Patient Account Number: [id number] ?Date of Birth/Sex: Treating  RN: ?20-Jul-1924 (88 y.o. [name], [name] ?Primary Care Provider: [name] Other Clinician: ?Referring Provider: ?Treating Provider/Extender: [name] [name] ?[name] ?Weeks in Treatment: 1 ?Verbal / Phone Orders: No ?Diagnosis Coding ?ICD-10 Coding ?Code Description ?I87.331 Chronic venous hypertension (idiopathic) with ulcer and inflammation of right lower extremity ?P82.423 Chronic venous hypertension (idiopathic) with ulcer and inflammation of left lower extremity ?N36.144 Non-pressure chronic ulcer of other part of left lower leg with fat layer exposed ?R15.400 Non-pressure chronic ulcer of other part of right lower leg with fat layer exposed ?I10 Essential (primary) hypertension ?I50.42 Chronic combined systolic (congestive) and diastolic (congestive) heart failure ?Follow-up Appointments ?ppointment in 1 week. Margarita Grizzle and Tracy Room # 9 ?Return A ?Other: Jeri Cos, PA is prescribing Levaquin today for possible pseudomas infection. He wrote the order on facility sheet. ?Registered Nurse to change legs dressings every other day and PRN soiled dressings!!! ?Bathing/ Shower/ Hygiene ?May shower with protection but do not get wound dressing(s) wet. ?Edema Control - Lymphedema / SCD / Other ?Elevate legs to the level of the heart or above for 30 minutes daily and/or when sitting, a frequency of: ?Avoid standing for long periods of time. ?Wound Treatment ?Wound #2 - Lower Leg Wound Laterality: Left, Circumferential ?Cleanser: Soap and Water Every Other Day/15 Days ?Discharge Instructions: May shower and wash wound with dial antibacterial soap and water prior to dressing change. ?Cleanser: Wound Cleanser Every Other Day/15 Days ?Discharge Instructions: Cleanse the wound with wound cleanser prior to applying a clean dressing using gauze sponges, not tissue or cotton balls. ?Peri-Wound Care: Zinc Oxide Ointment 30g tube Every Other Day/15 Days ?Discharge Instructions: Apply Zinc Oxide to periwound  with each dressing change ?Peri-Wound Care: Sween Lotion (Moisturizing lotion) Every Other Day/15 Days ?Discharge Instructions: Apply moisturizing lotion as directed ?Prim Dressing: Xeroform Occlusive Gauze Dress

## 2021-10-28 ENCOUNTER — Non-Acute Institutional Stay (SKILLED_NURSING_FACILITY): Payer: Medicare HMO | Admitting: Orthopedic Surgery

## 2021-10-28 ENCOUNTER — Encounter: Payer: Self-pay | Admitting: Orthopedic Surgery

## 2021-10-28 DIAGNOSIS — R569 Unspecified convulsions: Secondary | ICD-10-CM | POA: Diagnosis not present

## 2021-10-28 DIAGNOSIS — I10 Essential (primary) hypertension: Secondary | ICD-10-CM

## 2021-10-28 DIAGNOSIS — M159 Polyosteoarthritis, unspecified: Secondary | ICD-10-CM

## 2021-10-28 DIAGNOSIS — E782 Mixed hyperlipidemia: Secondary | ICD-10-CM

## 2021-10-28 DIAGNOSIS — I480 Paroxysmal atrial fibrillation: Secondary | ICD-10-CM

## 2021-10-28 DIAGNOSIS — L03115 Cellulitis of right lower limb: Secondary | ICD-10-CM

## 2021-10-28 DIAGNOSIS — K219 Gastro-esophageal reflux disease without esophagitis: Secondary | ICD-10-CM

## 2021-10-28 DIAGNOSIS — Z8673 Personal history of transient ischemic attack (TIA), and cerebral infarction without residual deficits: Secondary | ICD-10-CM | POA: Diagnosis not present

## 2021-10-28 DIAGNOSIS — I5032 Chronic diastolic (congestive) heart failure: Secondary | ICD-10-CM | POA: Diagnosis not present

## 2021-10-28 DIAGNOSIS — I872 Venous insufficiency (chronic) (peripheral): Secondary | ICD-10-CM

## 2021-10-28 LAB — AEROBIC/ANAEROBIC CULTURE W GRAM STAIN (SURGICAL/DEEP WOUND)

## 2021-10-28 NOTE — Progress Notes (Signed)
?Location:  [facility] ?Nursing Home Room Number: N061/A ?Place of Service:  SNF (31) ?Provider: Yvonna Alanis, NP ? ? ?Patient Care Team: ?[name], MD as PCP - General (Internal Medicine) ?Gatha Mayer, MD as Consulting Physician (Gastroenterology) ?[name], [name], MD as Consulting Physician (Cardiology) ?[name], MD as Attending Physician (Internal Medicine) ?Mast, Man X, NP as Nurse Practitioner (Internal Medicine) ? ?Extended Emergency Contact Information ?Primary Emergency Contact: Paulick,[name] ?Address: [address] ?         [place], [place] [address] [place] ?Home Phone: [phone] ?Mobile Phone: [phone] ?Relation: Daughter ?Secondary Emergency Contact: Rosado,[name] ?Address: [address] ?         [place] [address] [place] ?Home Phone: [phone] ?Mobile Phone: [phone] ?Relation: Son ? ?Code Status:  DNR ?Goals of care: Advanced Directive information ? ?  10/28/2021  ?  3:37 PM  ?Advanced Directives  ?Does Patient Have a Medical Advance Directive? Yes  ?Type of Paramedic of [place];Living will;Out of facility DNR (pink MOST or yellow form)  ?Does patient want to make changes to medical advance directive? No - Patient declined  ?Copy of [place] in Chart? Yes - validated most recent copy scanned in chart (See row information)  ? ? ? ?Chief Complaint  ?Patient presents with  ? Medical Management of Chronic Issues  ?  Routine visit.   ? Quality Metric Gaps  ?  Discuss the need for Shingrix vaccine or post pone if patient refuses.   ? ? ?HPI:  ?Pt is a 88 y.o. male seen today for medical management of chronic diseases.   ? ?He currently resides on the skilled nursing unit at [facility]. PMH: CAD, diastolic heart failure, HTN, atrial fibrillation venous insufficiency, GERD, meningioma, seizures, OA, BPH, anemia and gait abnormality.  ? ?Venous  ulcers/cellulitis- referred to wound care due to slow healing, 05/10 bright green drainage noted- wound culture confirmed pseudomonas infection- started on Levaquin,dressing changes QOD with zinc oxide ?HTN- BUN/creat 35/1.3 10/15/2021, remains on amlodipine and lisinopril ?HLD- LDL 61 07/18/2021, remains on crestor ?PAF- HR controlled without medication, poor candidate for anticoagulation ?CHF- LVEF 65-70% 4193, grade 1 diastolic dysfunction, remains on furosemide ?H/o TIA- right hand numbness has resolved, requiring assistance with ADLs, aspirin increased to 325 mg 10/2020, remains on statin, declined neurology consult in past ?Seizures- followed by neurology, no recent seizures, remains on keppra ?GERD- hgb 14.1 07/18/2021, remains on omeprazole ?OA- involves lower back and right hip, remains on gabapentin and Tylenol  ? ?05/13 fall without injury. Found in front of commode. Ambulates with wheelchair.  ? ?Recent blood pressures: ? 05/16- 156/64 ? 05/15- 118/68, 136/76 ? ?Recent weights: ? 05/01- 185.9 lbs ? 04/01- 189.7 lbs ? 03/01- 186.8 lbs ?  ? ? ? ?Past Medical History:  ?Diagnosis Date  ? Allergic rhinitis   ? Amnesia   ? Atherosclerotic heart disease of native coronary artery without angina pectoris   ? BPH (benign prostatic hyperplasia)   ? Bradycardia   ? Chronic diastolic (congestive) heart failure (HCC)   ? Colon polyps   ? Coronary artery disease   ? MODERATE  ? Dizziness   ? Edema of lower extremity   ? Erectile dysfunction   ? H. pylori infection   ? Hx of   ? Hard of hearing   ? Headache(784.0)   ? History of colon polyps 2009  ? Dr. Levester Fresh -  small cecal adenoma  ? History of falling   ? Hx of colonoscopy 2009  ? Dr Lajoyce Corners, hemorrhoids & polyps  ? Hyperlipidemia   ? Hypertension   ? Hypertensive heart disease with heart failure ([place])   ? Hypokalemia   ? Internal hemorrhoids 2009  ? Dr. Lajoyce Corners Colonoscopy  ? Metabolic encephalopathy   ? Mitral valve regurgitation   ? Muscle weakness (generalized)    ? Pneumonia   ? Primary generalized (osteo)arthritis   ? Reflux esophagitis   ? Sepsis ([place]) 11/04/2018  ? Unspecified hemorrhoids   ? Unsteadiness on feet   ? Vitamin D deficiency   ? ?Past Surgical History:  ?Procedure Laterality Date  ? APPENDECTOMY    ? CARDIAC CATHETERIZATION  1997  ? REVEALED MILD TO MODERATE IRREGULARITIES. HIS LEFT EVNTRICULAR SYSTOLIC FUNCTION REVEALED NORMAL EJECTION FRACTION WITH EF OF 70%  ? CATARACT EXTRACTION, BILATERAL    ? COLONOSCOPY  multiple  ? fatty tumor removal    ? benign  ? HEMORROIDECTOMY    ? HIP ARTHROPLASTY Left 08/17/2013  ? Procedure: ARTHROPLASTY BIPOLAR HIP;  Surgeon: Gearlean Alf, MD;  Location: [facility];  Service: Orthopedics;  Laterality: Left;  ? INGUINAL HERNIA REPAIR Bilateral 2006  ? lower back surgery  2006  ? NASAL SEPTUM SURGERY    ? TONSILLECTOMY    ? UPPER GASTROINTESTINAL ENDOSCOPY    ? ? ?No Known Allergies ? ?Outpatient Encounter Medications as of 10/28/2021  ?Medication Sig  ? acetaminophen (TYLENOL) 500 MG tablet Take 500 mg by mouth every 8 (eight) hours as needed.  ? amLODipine (NORVASC) 5 MG tablet Take 5 mg by mouth daily.  ? aspirin 325 MG tablet Take 325 mg by mouth daily.  ? DEXTRAN 70-HYPROMELLOSE OP Apply 1 drop to eye 3 (three) times daily as needed. Special Instructions: as needed for dry eyes/itching  ? Emollient (CERAVE) CREA Apply topically at bedtime. To lower extremities @ bedtime  ? hydrocortisone (ANUSOL-HC) 2.5 % rectal cream Place 1 application rectally 2 (two) times daily as needed for anal itching or hemorrhoids (USE PERINEAL APPLICATOR). With perineal applicator  ? levETIRAcetam (KEPPRA) 500 MG tablet Take 1 tablet (500 mg total) by mouth 2 (two) times daily.  ? levofloxacin (LEVAQUIN) 500 MG tablet Take 500 mg by mouth daily.  ? lisinopril (ZESTRIL) 40 MG tablet Take 40 mg by mouth daily.  ? Multiple Vitamins-Minerals (THERA-M) TABS Take 1 tablet by mouth daily.  ? omeprazole (PRILOSEC) 20 MG capsule Take 20 mg by mouth daily.   ? potassium chloride SA (KLOR-CON) 20 MEQ tablet Take 20 mEq by mouth daily.  ? rosuvastatin (CRESTOR) 5 MG tablet Take 5 mg by mouth daily.   ? saccharomyces boulardii (FLORASTOR) 250 MG capsule Take 250 mg by mouth 2 (two) times daily.  ? torsemide (DEMADEX) 20 MG tablet Take 20 mg by mouth daily.  ? vitamin B-12 (CYANOCOBALAMIN) 1000 MCG tablet Take 1,000 mcg by mouth daily.  ? [DISCONTINUED] ammonium lactate (AMLACTIN) 12 % cream Apply 1 application topically at bedtime.  ? ?No facility-administered encounter medications on file as of 10/28/2021.  ? ? ?Review of Systems  ?Constitutional:  Negative for activity change, appetite change, chills, fatigue and fever.  ?HENT:  Positive for hearing loss. Negative for trouble swallowing.   ?Eyes:  Negative for visual disturbance.  ?Respiratory:  Negative for cough, shortness of breath and wheezing.   ?Cardiovascular:  Positive for leg swelling. Negative for chest pain.  ?Gastrointestinal:  Positive for constipation. Negative  for abdominal distention, abdominal pain, blood in stool, diarrhea, nausea and vomiting.  ?Genitourinary:  Positive for frequency. Negative for dysuria and hematuria.  ?Musculoskeletal:  Positive for arthralgias, back pain and gait problem.  ?Skin:  Positive for wound.  ?Neurological:  Positive for weakness and numbness. Negative for dizziness and headaches.  ?Psychiatric/Behavioral:  Negative for confusion, dysphoric mood and sleep disturbance. The patient is not nervous/anxious.   ? ?Immunization History  ?Administered Date(s) Administered  ? Influenza, High Dose Seasonal PF 03/19/2019, 03/19/2019  ? Influenza-Unspecified 04/08/2016, 03/27/2020, 04/03/2021  ? Moderna Sars-Covid-2 Vaccination 06/18/2019, 07/16/2019, 04/24/2020, 11/13/2020  ? Pension scheme manager 20yr & up 03/05/2021  ? Pneumococcal Conjugate-13 06/02/2013  ? Pneumococcal Polysaccharide-23 04/07/2001  ? Tetanus 08/14/2013  ? Zoster Recombinat (Shingrix)  08/03/2021  ? ?Pertinent  Health Maintenance Due  ?Topic Date Due  ? INFLUENZA VACCINE  01/14/2022  ? ? ?  09/21/2019  ?  8:15 PM 09/22/2019  ?  7:30 AM 09/22/2019  ?  9:00 PM 09/23/2019  ?  9:30 AM 01/12/2020  ?  3:02 AM  ?Fall Risk

## 2021-10-30 ENCOUNTER — Encounter (HOSPITAL_BASED_OUTPATIENT_CLINIC_OR_DEPARTMENT_OTHER): Payer: Medicare HMO | Admitting: Physician Assistant

## 2021-10-30 DIAGNOSIS — I2510 Atherosclerotic heart disease of native coronary artery without angina pectoris: Secondary | ICD-10-CM | POA: Diagnosis not present

## 2021-10-30 DIAGNOSIS — I872 Venous insufficiency (chronic) (peripheral): Secondary | ICD-10-CM | POA: Diagnosis not present

## 2021-10-30 DIAGNOSIS — G40909 Epilepsy, unspecified, not intractable, without status epilepticus: Secondary | ICD-10-CM | POA: Diagnosis not present

## 2021-10-30 DIAGNOSIS — L97822 Non-pressure chronic ulcer of other part of left lower leg with fat layer exposed: Secondary | ICD-10-CM | POA: Diagnosis not present

## 2021-10-30 DIAGNOSIS — I110 Hypertensive heart disease with heart failure: Secondary | ICD-10-CM | POA: Diagnosis not present

## 2021-10-30 DIAGNOSIS — I87333 Chronic venous hypertension (idiopathic) with ulcer and inflammation of bilateral lower extremity: Secondary | ICD-10-CM | POA: Diagnosis not present

## 2021-10-30 DIAGNOSIS — L97812 Non-pressure chronic ulcer of other part of right lower leg with fat layer exposed: Secondary | ICD-10-CM | POA: Diagnosis not present

## 2021-10-30 DIAGNOSIS — R0989 Other specified symptoms and signs involving the circulatory and respiratory systems: Secondary | ICD-10-CM | POA: Diagnosis not present

## 2021-10-30 DIAGNOSIS — I5042 Chronic combined systolic (congestive) and diastolic (congestive) heart failure: Secondary | ICD-10-CM | POA: Diagnosis not present

## 2021-10-30 DIAGNOSIS — N400 Enlarged prostate without lower urinary tract symptoms: Secondary | ICD-10-CM | POA: Diagnosis not present

## 2021-10-30 NOTE — Progress Notes (Signed)
PAULETTE, LYNCH ([id number]) ?Visit Report for 10/30/2021 ?Chief Complaint Document Details ?Patient Name: Date of Service: ?[name] [name], [name] [name]. 10/30/2021 10:00 A M ?Medical Record Number: [id number] ?Patient Account Number: [id number] ?Date of Birth/Sex: Treating RN: ?[birth year] (88 y.o. [name] [name], [name] [name] ?Primary Care Provider: Veleta [name] Other Clinician: ?Referring Provider: ?Treating Provider/Extender: Worthy Keeler ?[name] [name] ?Weeks in Treatment: 2 ?Information Obtained from: Patient ?Chief Complaint ?Bilateral LE Ulcers ?Electronic Signature(s) ?Signed: 10/30/2021 10:32:27 AM By: Worthy Keeler PA-C ?Entered By: Worthy Keeler on 10/30/2021 10:32:27 ?-------------------------------------------------------------------------------- ?Problem List Details ?Patient Name: Date of Service: ?[name] [name], [name] [name]. 10/30/2021 10:00 A M ?Medical Record Number: [id number] ?Patient Account Number: [id number] ?Date of Birth/Sex: Treating RN: ?02/14/1925 (88 y.o. [name] [name], [name] [name] ?Primary Care Provider: Veleta [name] Other Clinician: ?Referring Provider: ?Treating Provider/Extender: Worthy Keeler ?[name] [name] ?Weeks in Treatment: 2 ?Active Problems ?ICD-10 ?Encounter ?Code Description Active Date MDM ?Diagnosis ?I87.331 Chronic venous hypertension (idiopathic) with ulcer and inflammation of right 10/16/2021 No Yes ?lower extremity ?Z76.734 Chronic venous hypertension (idiopathic) with ulcer and inflammation of left 10/16/2021 No Yes ?lower extremity ?L93.790 Non-pressure chronic ulcer of other part of left lower leg with fat layer exposed5/08/2021 No Yes ?W40.973 Non-pressure chronic ulcer of other part of right lower leg with fat layer 10/16/2021 No Yes ?exposed ?I10 Essential (primary) hypertension 10/16/2021 No Yes ?I50.42 Chronic combined systolic (congestive) and diastolic (congestive) heart failure 10/16/2021 No Yes ?Inactive Problems ?Resolved Problems ?Electronic Signature(s) ?Signed: 10/30/2021 10:32:04 AM  By: Worthy Keeler PA-C ?Entered By: Worthy Keeler on 10/30/2021 10:32:04 ?

## 2021-11-06 ENCOUNTER — Encounter (HOSPITAL_BASED_OUTPATIENT_CLINIC_OR_DEPARTMENT_OTHER): Payer: Medicare HMO | Admitting: Physician Assistant

## 2021-11-06 DIAGNOSIS — L97822 Non-pressure chronic ulcer of other part of left lower leg with fat layer exposed: Secondary | ICD-10-CM | POA: Diagnosis not present

## 2021-11-06 DIAGNOSIS — I87333 Chronic venous hypertension (idiopathic) with ulcer and inflammation of bilateral lower extremity: Secondary | ICD-10-CM | POA: Diagnosis not present

## 2021-11-06 DIAGNOSIS — I872 Venous insufficiency (chronic) (peripheral): Secondary | ICD-10-CM | POA: Diagnosis not present

## 2021-11-06 DIAGNOSIS — N400 Enlarged prostate without lower urinary tract symptoms: Secondary | ICD-10-CM | POA: Diagnosis not present

## 2021-11-06 DIAGNOSIS — R0989 Other specified symptoms and signs involving the circulatory and respiratory systems: Secondary | ICD-10-CM | POA: Diagnosis not present

## 2021-11-06 DIAGNOSIS — I110 Hypertensive heart disease with heart failure: Secondary | ICD-10-CM | POA: Diagnosis not present

## 2021-11-06 DIAGNOSIS — G40909 Epilepsy, unspecified, not intractable, without status epilepticus: Secondary | ICD-10-CM | POA: Diagnosis not present

## 2021-11-06 DIAGNOSIS — I2510 Atherosclerotic heart disease of native coronary artery without angina pectoris: Secondary | ICD-10-CM | POA: Diagnosis not present

## 2021-11-06 DIAGNOSIS — L97812 Non-pressure chronic ulcer of other part of right lower leg with fat layer exposed: Secondary | ICD-10-CM | POA: Diagnosis not present

## 2021-11-06 DIAGNOSIS — I5042 Chronic combined systolic (congestive) and diastolic (congestive) heart failure: Secondary | ICD-10-CM | POA: Diagnosis not present

## 2021-11-06 NOTE — Progress Notes (Signed)
HART, HAAS ([id number]) Visit Report for 11/06/2021 Arrival Information Details Patient Name: Date of Service: ROI, JAFARI 11/06/2021 10:00 [place] Record Number: [id number] Patient Account Number: [id number] Date of Birth/Sex: Treating RN: [birth year] (88 y.o. [name], [name] Primary Care Rayshard Schirtzinger: [name] Other Clinician: Referring Tore Carreker: Treating Tuyet Bader/Extender: [name] in Treatment: 3 Visit Information History Since Last Visit Added or deleted any medications: No Patient Arrived: Wheel Chair Any new allergies or adverse reactions: No Arrival Time: 10:09 Had a fall or experienced change in No Accompanied By: self activities of daily living that may affect Transfer Assistance: Manual risk of falls: Patient Identification Verified: Yes Signs or symptoms of abuse/neglect since last visito No Secondary Verification Process Completed: Yes Hospitalized since last visit: No Patient Requires Transmission-Based Precautions: No Implantable device outside of the clinic excluding No Patient Has Alerts: Yes cellular tissue based products placed in the center Patient Alerts: ABI's 05/23 R: N/C since last visit: Has Dressing in Place as Prescribed: Yes Has Compression in Place as Prescribed: Yes Pain Present Now: No Electronic Signature(s) Signed: 11/06/2021 3:02:16 PM By: Rhae Hammock RN Entered By: Rhae Hammock on 11/06/2021 10:09:42 -------------------------------------------------------------------------------- Clinic Level of Care Assessment Details Patient Name: Date of Service: DAVEION, ROBAR 11/06/2021 10:00 [place] Record Number: [id number] Patient Account Number: [id number] Date of Birth/Sex: Treating RN: [birth year] (88 y.o. [name], [name] Primary Care Jud Fanguy: [name] Other Clinician: Referring Aleea Hendry: Treating Kasra Melvin/Extender: [name] in  Treatment: 3 Clinic Level of Care Assessment Items TOOL 4 Quantity Score X- 1 0 Use when only an EandM is performed on FOLLOW-UP visit ASSESSMENTS - Nursing Assessment / Reassessment X- 1 10 Reassessment of Co-morbidities (includes updates in patient status) X- 1 5 Reassessment of Adherence to Treatment Plan ASSESSMENTS - Wound and Skin A ssessment / Reassessment '[]'$  - 0 Simple Wound Assessment / Reassessment - one wound X- 2 5 Complex Wound Assessment / Reassessment - multiple wounds '[]'$  - 0 Dermatologic / Skin Assessment (not related to wound area) ASSESSMENTS - Focused Assessment X- 2 5 Circumferential Edema Measurements - multi extremities '[]'$  - 0 Nutritional Assessment / Counseling / Intervention '[]'$  - 0 Lower Extremity Assessment (monofilament, tuning fork, pulses) '[]'$  - 0 Peripheral Arterial Disease Assessment (using hand held doppler) ASSESSMENTS - Ostomy and/or Continence Assessment and Care '[]'$  - 0 Incontinence Assessment and Management '[]'$  - 0 Ostomy Care Assessment and Management (repouching, etc.) PROCESS - Coordination of Care '[]'$  - 0 Simple Patient / Family Education for ongoing care X- 1 20 Complex (extensive) Patient / Family Education for ongoing care X- 1 10 Staff obtains Programmer, systems, Records, T Results / Process Orders est X- 1 10 Staff telephones HHA, Nursing Homes / Clarify orders / etc '[]'$  - 0 Routine Transfer to another Facility (non-emergent condition) '[]'$  - 0 Routine Hospital Admission (non-emergent condition) '[]'$  - 0 New Admissions / Biomedical engineer / Ordering NPWT Apligraf, etc. , '[]'$  - 0 Emergency Hospital Admission (emergent condition) '[]'$  - 0 Simple Discharge Coordination X- 1 15 Complex (extensive) Discharge Coordination PROCESS - Special Needs '[]'$  - 0 Pediatric / Minor Patient Management '[]'$  - 0 Isolation Patient Management '[]'$  - 0 Hearing / Language / Visual special needs '[]'$  - 0 Assessment of Community assistance (transportation,  D/C planning, etc.) '[]'$  - 0 Additional assistance / Altered mentation '[]'$  - 0 Support Surface(s) Assessment (bed, cushion, seat, etc.) INTERVENTIONS - Wound Cleansing / Measurement '[]'$  - 0 Simple  Wound Cleansing - one wound X- 2 5 Complex Wound Cleansing - multiple wounds X- 1 5 Wound Imaging (photographs - any number of wounds) '[]'$  - 0 Wound Tracing (instead of photographs) '[]'$  - 0 Simple Wound Measurement - one wound X- 2 5 Complex Wound Measurement - multiple wounds INTERVENTIONS - Wound Dressings '[]'$  - 0 Small Wound Dressing one or multiple wounds X- 2 15 Medium Wound Dressing one or multiple wounds '[]'$  - 0 Large Wound Dressing one or multiple wounds '[]'$  - 0 Application of Medications - topical '[]'$  - 0 Application of Medications - injection INTERVENTIONS - Miscellaneous '[]'$  - 0 External ear exam '[]'$  - 0 Specimen Collection (cultures, biopsies, blood, body fluids, etc.) '[]'$  - 0 Specimen(s) / Culture(s) sent or taken to Lab for analysis '[]'$  - 0 Patient Transfer (multiple staff / Civil Service fast streamer / Similar devices) '[]'$  - 0 Simple Staple / Suture removal (25 or less) '[]'$  - 0 Complex Staple / Suture removal (26 or more) '[]'$  - 0 Hypo / Hyperglycemic Management (close monitor of Blood Glucose) '[]'$  - 0 Ankle / Brachial Index (ABI) - do not check if billed separately X- 1 5 Vital Signs Has the patient been seen at the hospital within the last three years: Yes Total Score: 150 Level Of Care: New/Established - Level 4 Electronic Signature(s) Signed: 11/06/2021 3:02:16 PM By: Rhae Hammock RN Entered By: Rhae Hammock on 11/06/2021 11:07:42 -------------------------------------------------------------------------------- Encounter Discharge Information Details Patient Name: Date of Service: [name]. 11/06/2021 10:00 [facility] Record Number: [id number] Patient Account Number: [id number] Date of Birth/Sex: Treating RN: 1924/12/20 (88 y.o. [name], [name] Primary  Care Jean Skow: [name] Other Clinician: Referring Kaian Fahs: Treating Verenice Westrich/Extender: [name] in Treatment: 3 Encounter Discharge Information Items Discharge Condition: Stable Ambulatory Status: Wheelchair Discharge Destination: Skilled Nursing Facility Telephoned: No Orders Sent: Yes Transportation: Private Auto Accompanied By: self Schedule Follow-up Appointment: Yes Clinical Summary of Care: Patient Declined Electronic Signature(s) Signed: 11/06/2021 3:02:16 PM By: Rhae Hammock RN Entered By: Rhae Hammock on 11/06/2021 11:10:39 -------------------------------------------------------------------------------- Lower Extremity Assessment Details Patient Name: Date of Service: JERE, BOSTROM 11/06/2021 10:00 [facility] Medical Record Number: [id number] Patient Account Number: [id number] Date of Birth/Sex: Treating RN: 1925-06-08 (88 y.o. [name], [name] Primary Care Christana Angelica: [name] Other Clinician: Referring Viviene Thurston: Treating Wisdom Seybold/Extender: Patricia Nettle, Meredith Staggers Weeks in Treatment: 3 Edema Assessment Assessed: Shirlyn Goltz: Yes] Patrice Paradise: Yes] Edema: [Left: Yes] [Right: Yes] Calf Left: Right: Point of Measurement: 34 cm From Medial Instep 34 cm 33.3 cm Ankle Left: Right: Point of Measurement: 9 cm From Medial Instep 23.5 cm 23 cm Vascular Assessment Pulses: Dorsalis Pedis Palpable: [Left:Yes] [Right:Yes] Posterior Tibial Palpable: [Left:Yes] [Right:Yes] Electronic Signature(s) Signed: 11/06/2021 3:02:16 PM By: Rhae Hammock RN Entered By: Rhae Hammock on 11/06/2021 10:26:26 -------------------------------------------------------------------------------- [place] Details Patient Name: Date of Service: [name]. 11/06/2021 10:00 A M Medical Record Number: [id number] Patient Account Number: [id number] Date of Birth/Sex: Treating RN: [birth year] (88 y.o. [name],  [name] Primary Care Shafter Jupin: [name] Other Clinician: Referring Brookelynn Hamor: Treating Haely Leyland/Extender: Patricia Nettle, Ihor Gully in Treatment: 3 Active Inactive Wound/Skin Impairment Nursing Diagnoses: Impaired tissue integrity Knowledge deficit related to ulceration/compromised skin integrity Goals: Patient will have a decrease in wound volume by X% from date: (specify in notes) Date Initiated: 10/16/2021 Target Resolution Date: 11/21/2021 Goal Status: Active Patient/caregiver will verbalize understanding of skin care regimen Date Initiated: 10/16/2021 Target Resolution Date: 11/21/2021 Goal Status: Active  Ulcer/skin breakdown will have a volume reduction of 30% by week 4 Date Initiated: 10/16/2021 Target Resolution Date: 11/21/2021 Goal Status: Active Interventions: Assess patient/caregiver ability to obtain necessary supplies Assess patient/caregiver ability to perform ulcer/skin care regimen upon admission and as needed Assess ulceration(s) every visit Notes: Electronic Signature(s) Signed: 11/06/2021 3:02:16 PM By: Rhae Hammock RN Entered By: Rhae Hammock on 11/06/2021 10:48:00 -------------------------------------------------------------------------------- Pain Assessment Details Patient Name: Date of Service: [name]. 11/06/2021 10:00 [place] Record Number: [id number] Patient Account Number: [id number] Date of Birth/Sex: Treating RN: [birth year] (88 y.o. Erie Noe Primary Care Kavonte Bearse: Other Clinician: [name] Referring Birttany Dechellis: Treating Jashira Cotugno/Extender: [name] in Treatment: 3 Active Problems Location of Pain Severity and Description of Pain Patient Has Paino No Site Locations Pain Management and Medication Current Pain Management: Electronic Signature(s) Signed: 11/06/2021 3:02:16 PM By: Rhae Hammock RN Entered By: Rhae Hammock on 11/06/2021  10:18:46 -------------------------------------------------------------------------------- Patient/Caregiver Education Details Patient Name: Date of Service: North, WILLIA M E. 5/24/2023andnbsp10:00 [place] Record Number: [id number] Patient Account Number: [id number] Date of Birth/Gender: Treating RN: 02-24-1925 (88 y.o. Erie Noe Primary Care Physician: [name] Other Clinician: Referring Physician: Treating Physician/Extender: [name] in Treatment: 3 Education Assessment Education Provided To: Patient Education Topics Provided Wound/Skin Impairment: Methods: Explain/Verbal Responses: Reinforcements needed, State content correctly Electronic Signature(s) Signed: 11/06/2021 3:02:16 PM By: Rhae Hammock RN Entered By: Rhae Hammock on 11/06/2021 10:48:20 -------------------------------------------------------------------------------- Wound Assessment Details Patient Name: Date of Service: [name]. 11/06/2021 10:00 [place] Record Number: [id number] Patient Account Number: [id number] Date of Birth/Sex: Treating RN: 1925-06-11 (88 y.o. [name], [name] Primary Care Reilley Valentine: [name] Other Clinician: Referring Love Milbourne: Treating Linc Renne/Extender: Patricia Nettle, Ihor Gully in Treatment: 3 Wound Status Wound Number: 2 Primary Venous Leg Ulcer Etiology: Wound Location: Left, Circumferential Lower Leg Wound Open Wounding Event: Blister Status: Date Acquired: 09/14/2021 Comorbid Arrhythmia, Congestive Heart Failure, Coronary Artery Disease, Weeks Of Treatment: 3 History: Hypertension, Peripheral Venous Disease, Osteoarthritis, Seizure Clustered Wound: Yes Disorder Photos Wound Measurements Length: (cm) 0.5 Width: (cm) 0.5 Depth: (cm) 0.1 Clustered Quantity: 7 Area: (cm) 0.196 Volume: (cm) 0.02 % Reduction in Area: 99.9% % Reduction in Volume: 99.9% Epithelialization: Small  (1-33%) Wound Description Classification: Full Thickness With Exposed Support Structures Wound Margin: Distinct, outline attached Exudate Amount: Medium Exudate Type: Serosanguineous Exudate Color: red, brown Foul Odor After Cleansing: No Slough/Fibrino Yes Wound Bed Granulation Amount: Medium (34-66%) Exposed Structure Granulation Quality: Red, Pink Fascia Exposed: No Necrotic Amount: Medium (34-66%) Fat Layer (Subcutaneous Tissue) Exposed: No Necrotic Quality: Adherent Slough Tendon Exposed: No Muscle Exposed: No Joint Exposed: No Bone Exposed: No Treatment Notes Wound #2 (Lower Leg) Wound Laterality: Left, Circumferential Cleanser Soap and Water Discharge Instruction: May shower and wash wound with dial antibacterial soap and water prior to dressing change. Wound Cleanser Discharge Instruction: Cleanse the wound with wound cleanser prior to applying a clean dressing using gauze sponges, not tissue or cotton balls. Peri-Wound Care Zinc Oxide Ointment 30g tube Discharge Instruction: Apply Zinc Oxide to periwound with each dressing change Sween Lotion (Moisturizing lotion) Discharge Instruction: Apply moisturizing lotion as directed Topical Primary Dressing Xeroform Occlusive Gauze Dressing, 4x4 in Discharge Instruction: Apply to wound bed as instructed Secondary Dressing ABD Pad, 5x9 Discharge Instruction: Apply over primary dressing as directed. Secured With Compression Wrap Kerlix Roll 4.5x3.1 (in/yd) Discharge Instruction: Apply Kerlix and Coban compression as directed. Coban Self-Adherent Wrap 4x5 (in/yd)  Discharge Instruction: Apply over Kerlix as directed. Compression Stockings Add-Ons Electronic Signature(s) Signed: 11/06/2021 3:02:16 PM By: Rhae Hammock RN Entered By: Rhae Hammock on 11/06/2021 10:28:07 -------------------------------------------------------------------------------- Wound Assessment Details Patient Name: Date of  Service: CHILD, [place]PO[place] 11/06/2021 10:00 [place] Record Number: [id number] Patient Account Number: [id number] Date of Birth/Sex: Treating RN: 09-Feb-1925 (88 y.o. [name], [name] Primary Care Tamela Elsayed: [name] Other Clinician: Referring Breshae Belcher: Treating Janila Arrazola/Extender: Patricia Nettle, Ihor Gully in Treatment: 3 Wound Status Wound Number: 3 Primary Venous Leg Ulcer Etiology: Wound Location: Right, Circumferential Lower Leg Wound Open Wounding Event: Blister Status: Date Acquired: 09/14/2021 Comorbid Arrhythmia, Congestive Heart Failure, Coronary Artery Disease, Weeks Of Treatment: 3 History: Hypertension, Peripheral Venous Disease, Osteoarthritis, Seizure Clustered Wound: Yes Disorder Photos Wound Measurements Length: (cm) 19.5 Width: (cm) 10 Depth: (cm) 0.1 Clustered Quantity: 3 Area: (cm) 153.153 Volume: (cm) 15.315 % Reduction in Area: 63.1% % Reduction in Volume: 63.1% Epithelialization: None Tunneling: No Undermining: No Wound Description Classification: Full Thickness With Exposed Support Structures Wound Margin: Distinct, outline attached Exudate Amount: Medium Exudate Type: Serosanguineous Exudate Color: red, brown Foul Odor After Cleansing: No Slough/Fibrino Yes Wound Bed Granulation Amount: Medium (34-66%) Exposed Structure Granulation Quality: Red, Pink Fascia Exposed: No Necrotic Amount: Medium (34-66%) Fat Layer (Subcutaneous Tissue) Exposed: No Necrotic Quality: Adherent Slough Tendon Exposed: No Muscle Exposed: No Joint Exposed: No Bone Exposed: No Treatment Notes Wound #3 (Lower Leg) Wound Laterality: Right, Circumferential Cleanser Soap and Water Discharge Instruction: May shower and wash wound with dial antibacterial soap and water prior to dressing change. Wound Cleanser Discharge Instruction: Cleanse the wound with wound cleanser prior to applying a clean dressing using gauze sponges, not tissue or  cotton balls. Peri-Wound Care Zinc Oxide Ointment 30g tube Discharge Instruction: Apply Zinc Oxide to periwound with each dressing change Sween Lotion (Moisturizing lotion) Discharge Instruction: Apply moisturizing lotion as directed Topical Primary Dressing Xeroform Occlusive Gauze Dressing, 4x4 in Discharge Instruction: Apply to wound bed as instructed Secondary Dressing ABD Pad, 5x9 Discharge Instruction: Apply over primary dressing as directed. Secured With Compression Wrap Kerlix Roll 4.5x3.1 (in/yd) Discharge Instruction: Apply Kerlix and Coban compression as directed. Coban Self-Adherent Wrap 4x5 (in/yd) Discharge Instruction: Apply over Kerlix as directed. Compression Stockings Add-Ons Electronic Signature(s) Signed: 11/06/2021 3:02:16 PM By: Rhae Hammock RN Entered By: Rhae Hammock on 11/06/2021 10:28:21 -------------------------------------------------------------------------------- Vitals Details Patient Name: Date of Service: [name]. 11/06/2021 10:00 [place] Record Number: [id number] Patient Account Number: [id number] Date of Birth/Sex: Treating RN: 09-23-1924 (88 y.o. [name], [name] Primary Care Cregg Jutte: [name] Other Clinician: Referring Isidoro Santillana: Treating Pierra Skora/Extender: Patricia Nettle, Ihor Gully in Treatment: 3 Vital Signs Time Taken: 10:18 Temperature (F): 98 Pulse (bpm): 84 Respiratory Rate (breaths/min): 17 Blood Pressure (mmHg): 110/65 Reference Range: 80 - 120 mg / dl Electronic Signature(s) Signed: 11/06/2021 3:02:16 PM By: Rhae Hammock RN Entered By: Rhae Hammock on 11/06/2021 10:18:17

## 2021-11-06 NOTE — Progress Notes (Addendum)
[name], [name] ([id number]) Visit Report for 11/06/2021 Chief Complaint Document Details Patient Name: Date of Service: [name] [name], [name] [name] 11/06/2021 10:00 [place] Record Number: [id number] Patient Account Number: [id number] Date of Birth/Sex: Treating RN: [birth year] (88 y.o. [name] [name], [name] Primary Care Provider: [name] Other Clinician: Referring Provider: Treating Provider/Extender: Patricia Nettle, Ihor Gully in Treatment: 3 Information Obtained from: Patient Chief Complaint Bilateral LE Ulcers Electronic Signature(s) Signed: 11/06/2021 10:03:56 AM By: Worthy Keeler PA-C Entered By: Worthy Keeler on 11/06/2021 10:03:56 -------------------------------------------------------------------------------- HPI Details Patient Name: Date of Service: [name] [name], [name] [name] 11/06/2021 10:00 [place] Record Number: [id number] Patient Account Number: [id number] Date of Birth/Sex: Treating RN: January 09, 1925 (88 y.o. [name] [name], [name] Primary Care Provider: [name] Other Clinician: Referring Provider: Treating Provider/Extender: [name] in Treatment: 3 History of Present Illness HPI Description: ADMISSION 04/02/2021 This is a pleasant 88 year old man sent to our clinic by Dr. Lyndel Safe from the skilled unit of [facility]. He has a wound on the right posterior calf. I am uncertain about the duration of this however it looks to be chronic. They are using Santyl covered with silver alginate he does not have any compression. The wound is 100% covered in very fibrinous adherent slough. It also appears that this is quite painful. The patient is exceptionally hard of hearing so it is difficult to get any additional history out of him. Past medical history includes hypertension, coronary artery disease, congestive heart failure, BPH, seizure disorder. He is nonambulatory for reasons that are not totally clear. He lives at the  [facility] unit of [facility]. ABI in our clinic was 1.03 on the right 11/2; 2-week follow-up. He is having the dressing changed at the facility. He arrives in the clinic with the area about the same in terms of surface area but better looking surface we have been using Iodoflex under 3 layer compression. Unfortunately we still do not have any history of exactly how long this is been there or how it happened. 11/16; 2-week follow-up. Nice improvement in the wound. He is using Iodoflex over 3 layer compression. 11/30 2-week follow-up. [facility] is at [facility] [place]. We have been using Iodoflex under 3 layer compression. They are not wrapping his leg properly but nevertheless the wound area has come down nicely. The patient requests coming here every 3 weeks instead of every 2 12/21; the area on the posterior right calf is fully epithelialized. The patient has chronic venous insufficiency. Readmission: 10-16-2021 upon evaluation today patient appears to be doing somewhat poorly in regard to the wounds on the bilateral lower extremities. He has been tolerating the dressing changes without complication. Fortunately there does not appear to be any evidence of active infection locally or systemically which is great news. Overall I am extremely pleased with where things stand. With that being said that obviously he has quite a bit of swelling and this is led to several areas of significant blistering over his bilateral lower extremities all the wounds are superficial this is the good news. Patient continues to have issues with chronic venous hypertension bilaterally with ulcerations. He also has a history of hypertension and congestive heart failure. He previously tolerated 3 layer compression wraps without complication. He was last seen and discharged in our office June 05, 2021. 10-23-2021 upon evaluation today patient appears to be doing excellent in regard to his legs. They do  look better although  there is a lot of bright green drainage I do believe this likely represents a Pseudomonas infection and that was discussed with him as best I could today again he is extremely hard of hearing. I had to write most of what I wanted him to know down. 10-30-2021 upon evaluation 20 today patient appears to be doing much better in regard to his leg ulcers. He has been taking the Levaquin without complication and overall very pleased in that regard. Fortunately I do not see any evidence of infection locally or systemically which is great news. No fevers, chills, nausea, vomiting, or diarrhea. 11-06-2021 upon evaluation today patient appears to be doing excellent in regard to his wounds. In fact the left leg is almost completely healed the right leg is significantly smaller. Overall I do believe we are on the right track here. Electronic Signature(s) Signed: 11/06/2021 3:21:03 PM By: Worthy Keeler PA-C Entered By: Worthy Keeler on 11/06/2021 15:21:03 -------------------------------------------------------------------------------- Physical Exam Details Patient Name: Date of Service: [name] [name], [name] [name] 11/06/2021 10:00 [place] Record Number: [id number] Patient Account Number: [id number] Date of Birth/Sex: Treating RN: 08-25-1924 (88 y.o. [name] [name], [name] Primary Care Provider: [name] Other Clinician: Referring Provider: Treating Provider/Extender: Patricia Nettle, Ihor Gully in Treatment: 3 Constitutional Well-nourished and well-hydrated in no acute distress. Respiratory normal breathing without difficulty. Psychiatric this patient is able to make decisions and demonstrates good insight into disease process. Alert and Oriented x 3. pleasant and cooperative. Notes Patient's wound beds actually showed signs of excellent improvement which is great news and overall I am extremely pleased with where we stand I think we are headed in the right direction  here and the patient is happy. He does want to see about just coming in 2 weeks he was fairly adamant about that even last week but I wanted him to come this week just to make sure. For that reason we are going to switch to a 2-week cycle for follow-up at this point. Electronic Signature(s) Signed: 11/06/2021 3:21:35 PM By: Worthy Keeler PA-C Entered By: Worthy Keeler on 11/06/2021 15:21:35 -------------------------------------------------------------------------------- Physician Orders Details Patient Name: Date of Service: ALIK, MAWSON 11/06/2021 10:00 [place] Record Number: [id number] Patient Account Number: [id number] Date of Birth/Sex: Treating RN: March 03, 1925 (88 y.o. [name] [name], [name] Primary Care Provider: [name] Other Clinician: Referring Provider: Treating Provider/Extender: [name] in Treatment: 3 Verbal / Phone Orders: No Diagnosis Coding ICD-10 Coding Code Description [phone] Chronic venous hypertension (idiopathic) with ulcer and inflammation of right lower extremity I87.332 Chronic venous hypertension (idiopathic) with ulcer and inflammation of left lower extremity L97.822 Non-pressure chronic ulcer of other part of left lower leg with fat layer exposed L97.812 Non-pressure chronic ulcer of other part of right lower leg with fat layer exposed I10 Essential (primary) hypertension I50.42 Chronic combined systolic (congestive) and diastolic (congestive) heart failure Follow-up Appointments ppointment in 2 weeks. - w/ Toney Rakes Room # 9 Return A Other: - Registered Nurse to change legs dressings every other day and PRN soiled dressings!!! Bathing/ Shower/ Hygiene May shower with protection but do not get wound dressing(s) wet. Edema Control - Lymphedema / SCD / Other Elevate legs to the level of the heart or above for 30 minutes daily and/or when sitting, a frequency of: Avoid standing for long periods of  time. Wound Treatment Wound #2 - Lower Leg Wound Laterality: Left, Circumferential Cleanser: Soap and Water Every Other Day/15  Days Discharge Instructions: May shower and wash wound with dial antibacterial soap and water prior to dressing change. Cleanser: Wound Cleanser Every Other Day/15 Days Discharge Instructions: Cleanse the wound with wound cleanser prior to applying a clean dressing using gauze sponges, not tissue or cotton balls. Peri-Wound Care: Zinc Oxide Ointment 30g tube Every Other Day/15 Days Discharge Instructions: Apply Zinc Oxide to periwound with each dressing change Peri-Wound Care: Sween Lotion (Moisturizing lotion) Every Other Day/15 Days Discharge Instructions: Apply moisturizing lotion as directed Prim Dressing: Xeroform Occlusive Gauze Dressing, 4x4 in Every Other Day/15 Days ary Discharge Instructions: Apply to wound bed as instructed Secondary Dressing: ABD Pad, 5x9 Every Other Day/15 Days Discharge Instructions: Apply over primary dressing as directed. Compression Wrap: Kerlix Roll 4.5x3.1 (in/yd) Every Other Day/15 Days Discharge Instructions: Apply Kerlix and Coban compression as directed. Compression Wrap: Coban Self-Adherent Wrap 4x5 (in/yd) Every Other Day/15 Days Discharge Instructions: Apply over Kerlix as directed. Wound #3 - Lower Leg Wound Laterality: Right, Circumferential Cleanser: Soap and Water Every Other Day/15 Days Discharge Instructions: May shower and wash wound with dial antibacterial soap and water prior to dressing change. Cleanser: Wound Cleanser Every Other Day/15 Days Discharge Instructions: Cleanse the wound with wound cleanser prior to applying a clean dressing using gauze sponges, not tissue or cotton balls. Peri-Wound Care: Zinc Oxide Ointment 30g tube Every Other Day/15 Days Discharge Instructions: Apply Zinc Oxide to periwound with each dressing change Peri-Wound Care: Sween Lotion (Moisturizing lotion) Every Other Day/15  Days Discharge Instructions: Apply moisturizing lotion as directed Prim Dressing: Xeroform Occlusive Gauze Dressing, 4x4 in Every Other Day/15 Days ary Discharge Instructions: Apply to wound bed as instructed Secondary Dressing: ABD Pad, 5x9 Every Other Day/15 Days Discharge Instructions: Apply over primary dressing as directed. Compression Wrap: Kerlix Roll 4.5x3.1 (in/yd) Every Other Day/15 Days Discharge Instructions: Apply Kerlix and Coban compression as directed. Compression Wrap: Coban Self-Adherent Wrap 4x5 (in/yd) Every Other Day/15 Days Discharge Instructions: Apply over Kerlix as directed. Electronic Signature(s) Signed: 11/06/2021 3:02:16 PM By: Rhae Hammock RN Signed: 11/06/2021 5:33:59 PM By: Worthy Keeler PA-C Entered By: Rhae Hammock on 11/06/2021 11:06:53 -------------------------------------------------------------------------------- Problem List Details Patient Name: Date of Service: [name] [name], [name] [name] 11/06/2021 10:00 [place] Record Number: [id number] Patient Account Number: [id number] Date of Birth/Sex: Treating RN: 1925/01/08 (88 y.o. [name] [name], [name] Primary Care Provider: [name] Other Clinician: Referring Provider: Treating Provider/Extender: [name] in Treatment: 3 Active Problems ICD-10 Encounter Code Description Active Date MDM Diagnosis I87.331 Chronic venous hypertension (idiopathic) with ulcer and inflammation of right 10/16/2021 No Yes lower extremity I87.332 Chronic venous hypertension (idiopathic) with ulcer and inflammation of left 10/16/2021 No Yes lower extremity L97.822 Non-pressure chronic ulcer of other part of left lower leg with fat layer exposed5/08/2021 No Yes L97.812 Non-pressure chronic ulcer of other part of right lower leg with fat layer 10/16/2021 No Yes exposed [place] (primary) hypertension 10/16/2021 No Yes I50.42 Chronic combined systolic (congestive) and diastolic  (congestive) heart failure 10/16/2021 No Yes Inactive Problems Resolved Problems Electronic Signature(s) Signed: 11/06/2021 10:03:32 AM By: Worthy Keeler PA-C Entered By: Worthy Keeler on 11/06/2021 10:03:32 -------------------------------------------------------------------------------- Progress Note Details Patient Name: Date of Service: [name]. 11/06/2021 10:00 [facility] Medical Record Number: [id number] Patient Account Number: [id number] Date of Birth/Sex: Treating RN: 06-10-1925 (88 y.o. [name] [name], [name] Primary Care Provider: [name] Other Clinician: Referring Provider: Treating Provider/Extender: [name] in Treatment: 3 Subjective Chief  Complaint Information obtained from Patient Bilateral LE Ulcers History of Present Illness (HPI) ADMISSION 04/02/2021 This is a pleasant 88 year old man sent to our clinic by Dr. Lyndel Safe from the skilled unit of [facility]. He has a wound on the right posterior calf. I am uncertain about the duration of this however it looks to be chronic. They are using Santyl covered with silver alginate he does not have any compression. The wound is 100% covered in very fibrinous adherent slough. It also appears that this is quite painful. The patient is exceptionally hard of hearing so it is difficult to get any additional history out of him. Past medical history includes hypertension, coronary artery disease, congestive heart failure, BPH, seizure disorder. He is nonambulatory for reasons that are not totally clear. He lives at the [facility] unit of [facility]. ABI in our clinic was 1.03 on the right 11/2; 2-week follow-up. He is having the dressing changed at the facility. He arrives in the clinic with the area about the same in terms of surface area but better looking surface we have been using Iodoflex under 3 layer compression. Unfortunately we still do not have any history of exactly  how long this is been there or how it happened. 11/16; 2-week follow-up. Nice improvement in the wound. He is using Iodoflex over 3 layer compression. 11/30 2-week follow-up. [facility] is at the [facility] [place]. We have been using Iodoflex under 3 layer compression. They are not wrapping his leg properly but nevertheless the wound area has come down nicely. The patient requests coming here every 3 weeks instead of every 2 12/21; the area on the posterior right calf is fully epithelialized. The patient has chronic venous insufficiency. Readmission: 10-16-2021 upon evaluation today patient appears to be doing somewhat poorly in regard to the wounds on the bilateral lower extremities. He has been tolerating the dressing changes without complication. Fortunately there does not appear to be any evidence of active infection locally or systemically which is great news. Overall I am extremely pleased with where things stand. With that being said that obviously he has quite a bit of swelling and this is led to several areas of significant blistering over his bilateral lower extremities all the wounds are superficial this is the good news. Patient continues to have issues with chronic venous hypertension bilaterally with ulcerations. He also has a history of hypertension and congestive heart failure. He previously tolerated 3 layer compression wraps without complication. He was last seen and discharged in our office June 05, 2021. 10-23-2021 upon evaluation today patient appears to be doing excellent in regard to his legs. They do look better although there is a lot of bright green drainage I do believe this likely represents a Pseudomonas infection and that was discussed with him as best I could today again he is extremely hard of hearing. I had to write most of what I wanted him to know down. 10-30-2021 upon evaluation 20 today patient appears to be doing much better in regard to his leg ulcers.  He has been taking the Levaquin without complication and overall very pleased in that regard. Fortunately I do not see any evidence of infection locally or systemically which is great news. No fevers, chills, nausea, vomiting, or diarrhea. 11-06-2021 upon evaluation today patient appears to be doing excellent in regard to his wounds. In fact the left leg is almost completely healed the right leg is significantly smaller. Overall I do believe we are  on the right track here. Objective Constitutional Well-nourished and well-hydrated in no acute distress. Vitals Time Taken: 10:18 AM, Temperature: 98 F, Pulse: 84 bpm, Respiratory Rate: 17 breaths/min, Blood Pressure: 110/65 mmHg. Respiratory normal breathing without difficulty. Psychiatric this patient is able to make decisions and demonstrates good insight into disease process. Alert and Oriented x 3. pleasant and cooperative. General Notes: Patient's wound beds actually showed signs of excellent improvement which is great news and overall I am extremely pleased with where we stand I think we are headed in the right direction here and the patient is happy. He does want to see about just coming in 2 weeks he was fairly adamant about that even last week but I wanted him to come this week just to make sure. For that reason we are going to switch to a 2-week cycle for follow-up at this point. Integumentary (Hair, Skin) Wound #2 status is Open. Original cause of wound was Blister. The date acquired was: 09/14/2021. The wound has been in treatment 3 weeks. The wound is located on the Left,Circumferential Lower Leg. The wound measures 0.5cm length x 0.5cm width x 0.1cm depth; 0.196cm^2 area and 0.02cm^3 volume. There is a medium amount of serosanguineous drainage noted. The wound margin is distinct with the outline attached to the wound base. There is medium (34-66%) red, pink granulation within the wound bed. There is a medium (34-66%) amount of necrotic  tissue within the wound bed including Adherent Slough. Wound #3 status is Open. Original cause of wound was Blister. The date acquired was: 09/14/2021. The wound has been in treatment 3 weeks. The wound is located on the Right,Circumferential Lower Leg. The wound measures 19.5cm length x 10cm width x 0.1cm depth; 153.153cm^2 area and 15.315cm^3 volume. There is no tunneling or undermining noted. There is a medium amount of serosanguineous drainage noted. The wound margin is distinct with the outline attached to the wound base. There is medium (34-66%) red, pink granulation within the wound bed. There is a medium (34-66%) amount of necrotic tissue within the wound bed including Adherent Slough. Assessment Active Problems ICD-10 Chronic venous hypertension (idiopathic) with ulcer and inflammation of right lower extremity Chronic venous hypertension (idiopathic) with ulcer and inflammation of left lower extremity Non-pressure chronic ulcer of other part of left lower leg with fat layer exposed Non-pressure chronic ulcer of other part of right lower leg with fat layer exposed Essential (primary) hypertension Chronic combined systolic (congestive) and diastolic (congestive) heart failure Plan Follow-up Appointments: Return Appointment in 2 weeks. - w/ Toney Rakes Room # 9 Other: - Registered Nurse to change legs dressings every other day and PRN soiled dressings!!! Bathing/ Shower/ Hygiene: May shower with protection but do not get wound dressing(s) wet. Edema Control - Lymphedema / SCD / Other: Elevate legs to the level of the heart or above for 30 minutes daily and/or when sitting, a frequency of: Avoid standing for long periods of time. WOUND #2: - Lower Leg Wound Laterality: Left, Circumferential Cleanser: Soap and Water Every Other Day/15 Days Discharge Instructions: May shower and wash wound with dial antibacterial soap and water prior to dressing change. Cleanser: Wound Cleanser Every  Other Day/15 Days Discharge Instructions: Cleanse the wound with wound cleanser prior to applying a clean dressing using gauze sponges, not tissue or cotton balls. Peri-Wound Care: Zinc Oxide Ointment 30g tube Every Other Day/15 Days Discharge Instructions: Apply Zinc Oxide to periwound with each dressing change Peri-Wound Care: Sween Lotion (Moisturizing lotion) Every Other Day/15  Days Discharge Instructions: Apply moisturizing lotion as directed Prim Dressing: Xeroform Occlusive Gauze Dressing, 4x4 in Every Other Day/15 Days ary Discharge Instructions: Apply to wound bed as instructed Secondary Dressing: ABD Pad, 5x9 Every Other Day/15 Days Discharge Instructions: Apply over primary dressing as directed. Com pression Wrap: Kerlix Roll 4.5x3.1 (in/yd) Every Other Day/15 Days Discharge Instructions: Apply Kerlix and Coban compression as directed. Com pression Wrap: Coban Self-Adherent Wrap 4x5 (in/yd) Every Other Day/15 Days Discharge Instructions: Apply over Kerlix as directed. WOUND #3: - Lower Leg Wound Laterality: Right, Circumferential Cleanser: Soap and Water Every Other Day/15 Days Discharge Instructions: May shower and wash wound with dial antibacterial soap and water prior to dressing change. Cleanser: Wound Cleanser Every Other Day/15 Days Discharge Instructions: Cleanse the wound with wound cleanser prior to applying a clean dressing using gauze sponges, not tissue or cotton balls. Peri-Wound Care: Zinc Oxide Ointment 30g tube Every Other Day/15 Days Discharge Instructions: Apply Zinc Oxide to periwound with each dressing change Peri-Wound Care: Sween Lotion (Moisturizing lotion) Every Other Day/15 Days Discharge Instructions: Apply moisturizing lotion as directed Prim Dressing: Xeroform Occlusive Gauze Dressing, 4x4 in Every Other Day/15 Days ary Discharge Instructions: Apply to wound bed as instructed Secondary Dressing: ABD Pad, 5x9 Every Other Day/15 Days Discharge  Instructions: Apply over primary dressing as directed. Com pression Wrap: Kerlix Roll 4.5x3.1 (in/yd) Every Other Day/15 Days Discharge Instructions: Apply Kerlix and Coban compression as directed. Com pression Wrap: Coban Self-Adherent Wrap 4x5 (in/yd) Every Other Day/15 Days Discharge Instructions: Apply over Kerlix as directed. 1. I am good recommend currently that we going to continue with the wound care measures as before and the patient is in agreement with plan. This includes the use of the Xeroform gauze which I do believe is doing well. 2. We will continue with ABD pad cover followed by roll gauze and Coban to secure which is good to help with compression and the Curlex and Coban way. We will see patient back for reevaluation in 2 weeks here in the clinic. If anything worsens or changes patient will contact our office for additional recommendations. Electronic Signature(s) Signed: 11/06/2021 3:22:03 PM By: Worthy Keeler PA-C Entered By: Worthy Keeler on 11/06/2021 15:22:03 -------------------------------------------------------------------------------- SuperBill Details Patient Name: Date of Service: [name] [name], RODRIQUEZ 11/06/2021 Medical Record Number: [id number] Patient Account Number: [id number] Date of Birth/Sex: Treating RN: [birth year] (88 y.o. [name] [name], [name] Primary Care Provider: [name] Other Clinician: Referring Provider: Treating Provider/Extender: Patricia Nettle, Ihor Gully in Treatment: 3 Diagnosis Coding ICD-10 Codes Code Description [id number] Chronic venous hypertension (idiopathic) with ulcer and inflammation of right lower extremity I87.332 Chronic venous hypertension (idiopathic) with ulcer and inflammation of left lower extremity L97.822 Non-pressure chronic ulcer of other part of left lower leg with fat layer exposed L97.812 Non-pressure chronic ulcer of other part of right lower leg with fat layer exposed I10 Essential (primary)  hypertension I50.42 Chronic combined systolic (congestive) and diastolic (congestive) heart failure Facility Procedures CPT4 Code: [id number] Description: 99214 - WOUND CARE VISIT-LEV 4 EST PT Modifier: Quantity: 1 Physician Procedures : CPT4 Code Description Modifier [id number] - WC PHYS LEVEL 3 - EST PT ICD-10 Diagnosis Description I87.331 Chronic venous hypertension (idiopathic) with ulcer and inflammation of right lower extremity I87.332 Chronic venous hypertension (idiopathic)  with ulcer and inflammation of left lower extremity L97.822 Non-pressure chronic ulcer of other part of left lower leg with fat layer exposed L97.812 Non-pressure chronic ulcer of other part of right lower leg  with fat layer exposed Quantity: 1 Electronic Signature(s) Signed: 11/06/2021 3:22:21 PM By: Worthy Keeler PA-C Previous Signature: 11/06/2021 3:02:16 PM Version By: Rhae Hammock RN Entered By: Worthy Keeler on 11/06/2021 15:22:21

## 2021-11-20 ENCOUNTER — Encounter (HOSPITAL_BASED_OUTPATIENT_CLINIC_OR_DEPARTMENT_OTHER): Payer: Medicare HMO | Attending: Physician Assistant | Admitting: Internal Medicine

## 2021-11-20 ENCOUNTER — Non-Acute Institutional Stay (SKILLED_NURSING_FACILITY): Payer: Medicare HMO | Admitting: Adult Health

## 2021-11-20 ENCOUNTER — Encounter: Payer: Self-pay | Admitting: Adult Health

## 2021-11-20 DIAGNOSIS — I87332 Chronic venous hypertension (idiopathic) with ulcer and inflammation of left lower extremity: Secondary | ICD-10-CM | POA: Diagnosis not present

## 2021-11-20 DIAGNOSIS — R569 Unspecified convulsions: Secondary | ICD-10-CM

## 2021-11-20 DIAGNOSIS — K219 Gastro-esophageal reflux disease without esophagitis: Secondary | ICD-10-CM

## 2021-11-20 DIAGNOSIS — I83029 Varicose veins of left lower extremity with ulcer of unspecified site: Secondary | ICD-10-CM | POA: Diagnosis not present

## 2021-11-20 DIAGNOSIS — E782 Mixed hyperlipidemia: Secondary | ICD-10-CM

## 2021-11-20 DIAGNOSIS — I110 Hypertensive heart disease with heart failure: Secondary | ICD-10-CM | POA: Diagnosis not present

## 2021-11-20 DIAGNOSIS — L97812 Non-pressure chronic ulcer of other part of right lower leg with fat layer exposed: Secondary | ICD-10-CM | POA: Diagnosis not present

## 2021-11-20 DIAGNOSIS — L97312 Non-pressure chronic ulcer of right ankle with fat layer exposed: Secondary | ICD-10-CM | POA: Diagnosis not present

## 2021-11-20 DIAGNOSIS — L97318 Non-pressure chronic ulcer of right ankle with other specified severity: Secondary | ICD-10-CM | POA: Insufficient documentation

## 2021-11-20 DIAGNOSIS — I87333 Chronic venous hypertension (idiopathic) with ulcer and inflammation of bilateral lower extremity: Secondary | ICD-10-CM | POA: Diagnosis not present

## 2021-11-20 DIAGNOSIS — L97929 Non-pressure chronic ulcer of unspecified part of left lower leg with unspecified severity: Secondary | ICD-10-CM

## 2021-11-20 DIAGNOSIS — L97822 Non-pressure chronic ulcer of other part of left lower leg with fat layer exposed: Secondary | ICD-10-CM | POA: Diagnosis not present

## 2021-11-20 DIAGNOSIS — I872 Venous insufficiency (chronic) (peripheral): Secondary | ICD-10-CM | POA: Diagnosis not present

## 2021-11-20 DIAGNOSIS — I83019 Varicose veins of right lower extremity with ulcer of unspecified site: Secondary | ICD-10-CM

## 2021-11-20 DIAGNOSIS — L97919 Non-pressure chronic ulcer of unspecified part of right lower leg with unspecified severity: Secondary | ICD-10-CM | POA: Diagnosis not present

## 2021-11-20 DIAGNOSIS — I5042 Chronic combined systolic (congestive) and diastolic (congestive) heart failure: Secondary | ICD-10-CM | POA: Diagnosis not present

## 2021-11-20 DIAGNOSIS — I10 Essential (primary) hypertension: Secondary | ICD-10-CM

## 2021-11-20 DIAGNOSIS — I5032 Chronic diastolic (congestive) heart failure: Secondary | ICD-10-CM

## 2021-11-20 NOTE — Progress Notes (Signed)
Location:  [facility] Room Number: 44 A Place of Service:  SNF (31) Provider:  Durenda Age, DNP, FNP-BC  Patient Care Team: [name], MD as PCP - General (Internal Medicine) Gatha Mayer, MD as Consulting Physician (Gastroenterology) [name], [name], MD as Consulting Physician (Cardiology) [name], MD as Attending Physician (Internal Medicine) Mast, Man X, NP as Nurse Practitioner (Internal Medicine)  Extended Emergency Contact Information Primary Emergency Contact: Ulysse,[name] Address: [place], [place] [address] [name] of [place] Phone: [phone] Mobile Phone: [phone] Relation: Daughter Secondary Emergency Contact: Szczesniak,[name] Address: [place] [address] [name] of [place] Phone: [phone] Mobile Phone: [phone] Relation: Son  Code Status:  DNR  Goals of care: Advanced Directive information    10/28/2021    3:37 PM  Advanced Directives  Does Patient Have a Medical Advance Directive? Yes  Type of Paramedic of [place];Living will;Out of facility DNR (pink MOST or yellow form)  Does patient want to make changes to medical advance directive? No - Patient declined  Copy of [place] in Chart? Yes - validated most recent copy scanned in chart (See row information)     Chief Complaint  Patient presents with   Medical Management of Chronic Issues    Routine Visit    HPI:  Pt is a 88 y.o. male seen today for medical management of chronic diseases. She is a long-term care resident of [facility].  Primary hypertension  -  SBPs ranging from 110 to 134, takes amlodipine 5 mg daily and lisinopril 40 mg daily  Chronic diastolic heart failure (HCC) -  no SOB, takes torsemide 20 mg 1 tab daily  Seizure (HCC) -no recent seizures, takes levetiracetam 500 mg twice a  day  Gastroesophageal reflux disease, unspecified whether esophagitis present -no complaints of acid reflux, takes omeprazole 20 mg daily  Mixed hyperlipidemia -  last K5.1, takes KCl ER 20 mEq twice a day  Venous ulcers of both lower extremities ([place]) -followed up with wound clinic today    Past Medical History:  Diagnosis Date   Allergic rhinitis    Amnesia    Atherosclerotic heart disease of native coronary artery without angina pectoris    BPH (benign prostatic hyperplasia)    Bradycardia    Chronic diastolic (congestive) heart failure (HCC)    Colon polyps    Coronary artery disease    MODERATE   Dizziness    Edema of lower extremity    Erectile dysfunction    H. pylori infection    Hx of    Hard of hearing    Headache(784.0)    History of colon polyps 2009   Dr. Levester Fresh -small cecal adenoma   History of falling    Hx of colonoscopy 2009   Dr Lajoyce Corners, hemorrhoids & polyps   Hyperlipidemia    Hypertension    Hypertensive heart disease with heart failure ([place])    Hypokalemia    Internal hemorrhoids 2009   Dr. Lajoyce Corners Colonoscopy   Metabolic encephalopathy    Mitral valve regurgitation    Muscle weakness (generalized)    Pneumonia    Primary generalized (osteo)arthritis    Reflux esophagitis    Sepsis ([place]) 11/04/2018   Unspecified hemorrhoids    Unsteadiness on feet    Vitamin D  deficiency    Past Surgical History:  Procedure Laterality Date   APPENDECTOMY     CARDIAC CATHETERIZATION  1997   REVEALED MILD TO MODERATE IRREGULARITIES. HIS LEFT EVNTRICULAR SYSTOLIC FUNCTION REVEALED NORMAL EJECTION FRACTION WITH EF OF 70%   CATARACT EXTRACTION, BILATERAL     COLONOSCOPY  multiple   fatty tumor removal     benign   HEMORROIDECTOMY     HIP ARTHROPLASTY Left 08/17/2013   Procedure: ARTHROPLASTY BIPOLAR HIP;  Surgeon: Gearlean Alf, MD;  Location: [facility];  Service: Orthopedics;  Laterality: Left;   INGUINAL HERNIA REPAIR Bilateral 2006   lower back surgery   2006   NASAL SEPTUM SURGERY     TONSILLECTOMY     UPPER GASTROINTESTINAL ENDOSCOPY      No Known Allergies  Outpatient Encounter Medications as of 11/20/2021  Medication Sig   acetaminophen (TYLENOL) 500 MG tablet Take 500 mg by mouth every 8 (eight) hours as needed.   amLODipine (NORVASC) 5 MG tablet Take 5 mg by mouth daily.   aspirin 325 MG tablet Take 325 mg by mouth daily.   DEXTRAN 70-HYPROMELLOSE OP Apply 1 drop to eye 3 (three) times daily as needed. Special Instructions: as needed for dry eyes/itching   Emollient (CERAVE) CREA Apply topically at bedtime. To lower extremities @ bedtime   hydrocortisone (ANUSOL-HC) 2.5 % rectal cream Place 1 application rectally 2 (two) times daily as needed for anal itching or hemorrhoids (USE PERINEAL APPLICATOR). With perineal applicator   levETIRAcetam (KEPPRA) 500 MG tablet Take 1 tablet (500 mg total) by mouth 2 (two) times daily.   levofloxacin (LEVAQUIN) 500 MG tablet Take 500 mg by mouth daily.   lisinopril (ZESTRIL) 40 MG tablet Take 40 mg by mouth daily.   Multiple Vitamins-Minerals (THERA-M) TABS Take 1 tablet by mouth daily.   omeprazole (PRILOSEC) 20 MG capsule Take 20 mg by mouth daily.   potassium chloride SA (KLOR-CON) 20 MEQ tablet Take 20 mEq by mouth daily.   rosuvastatin (CRESTOR) 5 MG tablet Take 5 mg by mouth daily.    saccharomyces boulardii (FLORASTOR) 250 MG capsule Take 250 mg by mouth 2 (two) times daily.   torsemide (DEMADEX) 20 MG tablet Take 20 mg by mouth daily.   vitamin B-12 (CYANOCOBALAMIN) 1000 MCG tablet Take 1,000 mcg by mouth daily.   No facility-administered encounter medications on file as of 11/20/2021.    Review of Systems  Constitutional:  Negative for activity change, appetite change and fever.  HENT:  Negative for sore throat.   Eyes: Negative.   Cardiovascular:  Negative for chest pain and leg swelling.  Gastrointestinal:  Negative for abdominal distention, diarrhea and vomiting.  Genitourinary:   Negative for dysuria, frequency and urgency.  Skin:  Positive for wound. Negative for color change.  Neurological:  Negative for dizziness and headaches.  Psychiatric/Behavioral:  Negative for behavioral problems and sleep disturbance. The patient is not nervous/anxious.        Immunization History  Administered Date(s) Administered   Influenza, High Dose Seasonal PF 03/19/2019, 03/19/2019   Influenza-Unspecified 04/08/2016, 03/27/2020, 04/03/2021   Moderna Sars-Covid-2 Vaccination 06/18/2019, 07/16/2019, 04/24/2020, 11/13/2020   Pfizer Covid-19 Vaccine Bivalent Booster 32yr & up 03/05/2021   Pneumococcal Conjugate-13 06/02/2013   Pneumococcal Polysaccharide-23 04/07/2001   Tetanus 08/14/2013   Zoster Recombinat (Shingrix) 08/03/2021   Pertinent  Health Maintenance Due  Topic Date Due   INFLUENZA VACCINE  01/14/2022      09/21/2019    8:15 PM 09/22/2019  7:30 AM 09/22/2019    9:00 PM 09/23/2019    9:30 AM 01/12/2020    3:02 AM  Fall Risk  Patient Fall Risk Level High fall risk High fall risk High fall risk High fall risk High fall risk     Vitals:   11/20/21 1702  BP: 130/66  Pulse: 70  Resp: 20  Temp: (!) 97 F (36.1 C)  SpO2: 96%  Weight: 176 lb 14.4 oz (80.2 kg)  Height: '5\' 7"'$  (1.702 m)   Body mass index is 27.71 kg/m.  Physical Exam Constitutional:      Appearance: Normal appearance.  HENT:     Head: Normocephalic and atraumatic.     Mouth/Throat:     Mouth: Mucous membranes are moist.  Eyes:     Conjunctiva/sclera: Conjunctivae normal.  Cardiovascular:     Rate and Rhythm: Normal rate and regular rhythm.     Pulses: Normal pulses.     Heart sounds: Normal heart sounds.  Pulmonary:     Effort: Pulmonary effort is normal.     Breath sounds: Normal breath sounds.  Abdominal:     General: Bowel sounds are normal.     Palpations: Abdomen is soft.  Musculoskeletal:        General: No swelling. Normal range of motion.     Cervical back: Normal range of  motion.  Skin:    General: Skin is warm and dry.     Comments: Bilateral lower legs covered with dressing.  Neurological:     General: No focal deficit present.     Mental Status: He is alert and oriented to person, place, and time.  Psychiatric:        Mood and Affect: Mood normal.        Behavior: Behavior normal.        Thought Content: Thought content normal.        Judgment: Judgment normal.        Labs reviewed: Recent Labs    02/05/21 0000 07/18/21 0000 10/15/21 0000  NA 137 141 143  K 4.0 4.3 5.1  CL 102 102 106  CO2 28* 31* 24*  BUN 14 16 35*  CREATININE 0.8 1.0 1.3  CALCIUM 8.7 9.3 9.6   Recent Labs    07/18/21 0000  AST 19  ALT 17  ALKPHOS 61  ALBUMIN 3.9   Recent Labs    07/18/21 0000  WBC 7.4  HGB 14.1  HCT 41  PLT 223   Lab Results  Component Value Date   TSH 1.77 04/06/2020   No results found for: HGBA1C Lab Results  Component Value Date   CHOL 131 07/18/2021   HDL 57 07/18/2021   LDLCALC 61 07/18/2021   TRIG 57 07/18/2021   CHOLHDL 2.7 02/17/2017    Significant Diagnostic Results in last 30 days:  No results found.  Assessment/Plan  1. Primary hypertension -Blood pressure well controlled Continue current medications  2. Chronic diastolic heart failure (HCC) -  stable, continue torsemide  3. Seizure ([place]) -   No recent seizures, continue levetiracetam  4. Gastroesophageal reflux disease, unspecified whether esophagitis present -   Stable, continue omeprazole  5. Mixed hyperlipidemia Lab Results  Component Value Date   CHOL 131 07/18/2021   HDL 57 07/18/2021   LDLCALC 61 07/18/2021   TRIG 57 07/18/2021   CHOLHDL 2.7 02/17/2017   -   Continue rosuvastatin  6. Venous ulcers of both lower extremities (HCC) -  dressing on bilateral lower  legs was changed today at the wound clinic -  continue wound treatment   Family/ staff Communication: Discussed plan of care with resident and charge nurse.  Labs/tests ordered:    BMP    Durenda Age, DNP, MSN, FNP-BC [facility] [phone] (Monday-Friday 8:00 a.m. - 5:00 p.m.) [phone] (after hours)

## 2021-11-21 DIAGNOSIS — N1831 Chronic kidney disease, stage 3a: Secondary | ICD-10-CM | POA: Diagnosis not present

## 2021-11-21 DIAGNOSIS — E876 Hypokalemia: Secondary | ICD-10-CM | POA: Diagnosis not present

## 2021-11-21 LAB — BASIC METABOLIC PANEL
BUN: 22 — AB (ref 4–21)
CO2: 30 — AB (ref 13–22)
Chloride: 108 (ref 99–108)
Creatinine: 1.1 (ref 0.6–1.3)
Glucose: 83
Potassium: 4.1 mEq/L (ref 3.5–5.1)
Sodium: 143 (ref 137–147)

## 2021-11-21 LAB — COMPREHENSIVE METABOLIC PANEL
Calcium: 8.7 (ref 8.7–10.7)
eGFR: 62

## 2021-11-21 NOTE — Progress Notes (Signed)
[name], [name] ([id number]) Visit Report for 11/20/2021 Debridement Details Patient Name: Date of Service: [name], [name] 11/20/2021 10:00 [place] Record Number: [id number] Patient Account Number: [id number] Date of Birth/Sex: Treating RN: 02-05-1925 (88 y.o. [name] [name], [name] [name] Primary Care Provider: Veleta [name] Other Clinician: Referring Provider: Treating Provider/Extender: [name] [name] in Treatment: 5 Debridement Performed for Assessment: Wound #4 Right,Lateral Ankle Performed By: Physician [name] [name]., MD Debridement Type: Debridement Severity of Tissue Pre Debridement: Fat layer exposed Level of Consciousness (Pre-procedure): Awake and Alert Pre-procedure Verification/Time Out Yes - 10:25 Taken: Start Time: 10:25 Pain Control: Lidocaine T Area Debrided (L x W): otal 1 (cm) x 1 (cm) = 1 (cm) Tissue and other material debrided: Viable, Non-Viable, Slough, Slough Level: Non-Viable Tissue Debridement Description: Selective/Open Wound Instrument: Curette Bleeding: Minimum Hemostasis Achieved: Pressure End Time: 10:25 Procedural Pain: 0 Post Procedural Pain: 0 Response to Treatment: Procedure was tolerated well Level of Consciousness (Post- Awake and Alert procedure): Post Debridement Measurements of Total Wound Length: (cm) 1 Width: (cm) 1 Depth: (cm) 0.1 Volume: (cm) 0.079 Character of Wound/Ulcer Post Debridement: Improved Severity of Tissue Post Debridement: Fat layer exposed Post Procedure Diagnosis Same as Pre-procedure Electronic Signature(s) Signed: 11/20/2021 5:09:19 PM By: [name] Ham MD Signed: 11/21/2021 4:55:17 PM By: [name] Hammock RN Entered By: [name] [name] on 11/20/2021 10:37:39 -------------------------------------------------------------------------------- HPI Details Patient Name: Date of Service: [name] [name]. 11/20/2021 10:00 A M Medical Record Number: [id number] Patient Account  Number: [id number] Date of Birth/Sex: Treating RN: February 16, 1925 (88 y.o. [name] [name], [name] [name] Primary Care Provider: Veleta [name] Other Clinician: Referring Provider: Treating Provider/Extender: [name] [name] in Treatment: 5 History of Present Illness HPI Description: ADMISSION 04/02/2021 This is a pleasant 88 year old man sent to our clinic by Dr. Lyndel Safe from the skilled unit of [facility] [place]. He has a wound on the right posterior calf. I am uncertain about the duration of this however it looks to be chronic. They are using Santyl covered with silver alginate he does not have any compression. The wound is 100% covered in very fibrinous adherent slough. It also appears that this is quite painful. The patient is exceptionally hard of hearing so it is difficult to get any additional history out of him. Past medical history includes hypertension, coronary artery disease, congestive heart failure, BPH, seizure disorder. He is nonambulatory for reasons that are not totally clear. He lives at [facility] of [facility] [place]. ABI in our clinic was 1.03 on the right 11/2; 2-week follow-up. He is having the dressing changed at the facility. He arrives in the clinic with the area about the same in terms of surface area but better looking surface we have been using Iodoflex under 3 layer compression. Unfortunately we still do not have any history of exactly how long this is been there or how it happened. 11/16; 2-week follow-up. Nice improvement in the wound. He is using Iodoflex over 3 layer compression. 11/30 2-week follow-up. [facility] [place]. We have been using Iodoflex under 3 layer compression. They are not wrapping his leg properly but nevertheless the wound area has come down nicely. The patient requests coming here every 3 weeks instead of every 2 12/21; the area on the posterior right calf is fully epithelialized. The patient  has chronic venous insufficiency. Readmission: 10-16-2021 upon evaluation today patient appears to be doing somewhat poorly in regard to the wounds on the bilateral lower  extremities. He has been tolerating the dressing changes without complication. Fortunately there does not appear to be any evidence of active infection locally or systemically which is great news. Overall I am extremely pleased with where things stand. With that being said that obviously he has quite a bit of swelling and this is led to several areas of significant blistering over his bilateral lower extremities all the wounds are superficial this is the good news. Patient continues to have issues with chronic venous hypertension bilaterally with ulcerations. He also has a history of hypertension and congestive heart failure. He previously tolerated 3 layer compression wraps without complication. He was last seen and discharged in our office June 05, 2021. 10-23-2021 upon evaluation today patient appears to be doing excellent in regard to his legs. They do look better although there is a lot of bright green drainage I do believe this likely represents a Pseudomonas infection and that was discussed with him as best I could today again he is extremely hard of hearing. I had to write most of what I wanted him to know down. 10-30-2021 upon evaluation 20 today patient appears to be doing much better in regard to his leg ulcers. He has been taking the Levaquin without complication and overall very pleased in that regard. Fortunately I do not see any evidence of infection locally or systemically which is great news. No fevers, chills, nausea, vomiting, or diarrhea. 11-06-2021 upon evaluation today patient appears to be doing excellent in regard to his wounds. In fact the left leg is almost completely healed the right leg is significantly smaller. Overall I do believe we are on the right track here. 6/7; 2-week follow-up. The patient's left  leg is essentially closed. His superficial areas anteriorly look like they are doing very well with nice rims of epithelialization. UNFORTUNATELY he has a new wound on the right lateral malleolus the exact etiology of this is not really clear but he is mostly complaining of this wound. We have been using Xeroform under kerlix Coban The patient is in the [facility]. I am uncertain whether he has 20/30 below-knee stockings or not but he is definitely going to need them. Electronic Signature(s) Signed: 11/20/2021 5:09:19 PM By: [name] Ham MD Entered By: [name] [name] on 11/20/2021 10:40:01 -------------------------------------------------------------------------------- Physical Exam Details Patient Name: Date of Service: [name], [name] 11/20/2021 10:00 A M Medical Record Number: [id number] Patient Account Number: [id number] Date of Birth/Sex: Treating RN: [birth year] (88 y.o. [name] [name], [name] [name] Primary Care Provider: Veleta [name] Other Clinician: Referring Provider: Treating Provider/Extender: [name] [name] in Treatment: 5 Constitutional Sitting or standing Blood Pressure is within target range for patient.. Pulse regular and within target range for patient.Marland Kitchen Respirations regular, non-labored and within target range.. Temperature is normal and within the target range for the patient.Marland Kitchen Appears in no distress. Notes Wound exam; I did not see anything open on the left leg. Chronic venous changes edema control is adequate. On the right leg is 2 wounds anteriorly look quite healthy rims of epithelialization no debridement is required. He has a new wound over the right lateral malleolus the exact etiology of this is not clear whether this might of been a wrap injury or was a pressure issue trauma I am uncertain.I used a #3 curette to debride some surface slough off this. After this the wound really did not look too bad I am not sure  is going to require ongoing debridement there was  very little depth Electronic Signature(s) Signed: 11/20/2021 5:09:19 PM By: [name] Ham MD Entered By: [name] [name] on 11/20/2021 10:43:27 -------------------------------------------------------------------------------- Physician Orders Details Patient Name: Date of Service: [name] [name]. 11/20/2021 10:00 A M Medical Record Number: [id number] Patient Account Number: [id number] Date of Birth/Sex: Treating RN: [birth year] (88 y.o. [name] [name], [name] [name] Primary Care Provider: Veleta [name] Other Clinician: Referring Provider: Treating Provider/Extender: [name] [name] in Treatment: 5 Verbal / Phone Orders: No Diagnosis Coding Follow-up Appointments ppointment in 2 weeks. - 12/04/21 @ 0915 w/ Margarita Grizzle and Allayne Butcher Room # 9 Return A Other: - ****PLEASE BE SURE TO ONLY USE KERLIX AND COBAN FOR COMPRESSION WRAPS TO BOTH LEGS and START WRAPS AT BOTTOM OF TOES AND GO UP TO JUST BELOW THE BEND OF THE KNEE**** Registered Nurse to change legs dressings every other day and PRN soiled dressings!!! Bathing/ Shower/ Hygiene May shower with protection but do not get wound dressing(s) wet. Edema Control - Lymphedema / SCD / Other Elevate legs to the level of the heart or above for 30 minutes daily and/or when sitting, a frequency of: Avoid standing for long periods of time. Compression stocking or Garment 20-30 mm/Hg pressure to: - PT will need 20 30 compression stockings for both legs once wounds are healed. . ***FACILITY TO ORDER*** Wound Treatment Wound #2 - Lower Leg Wound Laterality: Left, Circumferential Cleanser: Soap and Water Every Other Day/15 Days Discharge Instructions: May shower and wash wound with dial antibacterial soap and water prior to dressing change. Cleanser: Wound Cleanser Every Other Day/15 Days Discharge Instructions: Cleanse the wound with wound cleanser prior to applying a clean dressing using  gauze sponges, not tissue or cotton balls. Peri-Wound Care: Zinc Oxide Ointment 30g tube Every Other Day/15 Days Discharge Instructions: Apply Zinc Oxide to periwound with each dressing change Peri-Wound Care: Sween Lotion (Moisturizing lotion) Every Other Day/15 Days Discharge Instructions: Apply moisturizing lotion as directed Prim Dressing: Xeroform Occlusive Gauze Dressing, 4x4 in Every Other Day/15 Days ary Discharge Instructions: Apply to wound bed as instructed Secondary Dressing: ABD Pad, 5x9 Every Other Day/15 Days Discharge Instructions: Apply over primary dressing as directed. Compression Wrap: Kerlix Roll 4.5x3.1 (in/yd) Every Other Day/15 Days Discharge Instructions: Apply Kerlix and Coban compression as directed. Compression Wrap: Coban Self-Adherent Wrap 4x5 (in/yd) Every Other Day/15 Days Discharge Instructions: Apply over Kerlix as directed. Wound #3 - Lower Leg Wound Laterality: Right, Anterior Cleanser: Soap and Water Every Other Day/15 Days Discharge Instructions: May shower and wash wound with dial antibacterial soap and water prior to dressing change. Cleanser: Wound Cleanser Every Other Day/15 Days Discharge Instructions: Cleanse the wound with wound cleanser prior to applying a clean dressing using gauze sponges, not tissue or cotton balls. Peri-Wound Care: Zinc Oxide Ointment 30g tube Every Other Day/15 Days Discharge Instructions: Apply Zinc Oxide to periwound with each dressing change Peri-Wound Care: Sween Lotion (Moisturizing lotion) Every Other Day/15 Days Discharge Instructions: Apply moisturizing lotion as directed Prim Dressing: Xeroform Occlusive Gauze Dressing, 4x4 in Every Other Day/15 Days ary Discharge Instructions: Apply to wound bed as instructed Secondary Dressing: ABD Pad, 5x9 Every Other Day/15 Days Discharge Instructions: Apply over primary dressing as directed. Compression Wrap: Kerlix Roll 4.5x3.1 (in/yd) Every Other Day/15 Days Discharge  Instructions: Apply Kerlix and Coban compression as directed. Compression Wrap: Coban Self-Adherent Wrap 4x5 (in/yd) Every Other Day/15 Days Discharge Instructions: Apply over Kerlix as directed. Wound #4 - Ankle Wound Laterality: Right, Lateral Cleanser: Soap and Water Every Other Day/15 Days Discharge  Instructions: May shower and wash wound with dial antibacterial soap and water prior to dressing change. Cleanser: Wound Cleanser Every Other Day/15 Days Discharge Instructions: Cleanse the wound with wound cleanser prior to applying a clean dressing using gauze sponges, not tissue or cotton balls. Peri-Wound Care: Zinc Oxide Ointment 30g tube Every Other Day/15 Days Discharge Instructions: Apply Zinc Oxide to periwound with each dressing change Peri-Wound Care: Sween Lotion (Moisturizing lotion) Every Other Day/15 Days Discharge Instructions: Apply moisturizing lotion as directed Prim Dressing: Xeroform Occlusive Gauze Dressing, 4x4 in Every Other Day/15 Days ary Discharge Instructions: Apply to wound bed as instructed Secondary Dressing: ABD Pad, 5x9 Every Other Day/15 Days Discharge Instructions: Apply over primary dressing as directed. Compression Wrap: Kerlix Roll 4.5x3.1 (in/yd) Every Other Day/15 Days Discharge Instructions: Apply Kerlix and Coban compression as directed. Compression Wrap: Coban Self-Adherent Wrap 4x5 (in/yd) Every Other Day/15 Days Discharge Instructions: Apply over Kerlix as directed. Electronic Signature(s) Signed: 11/20/2021 5:09:19 PM By: [name] Ham MD Signed: 11/21/2021 4:55:17 PM By: [name] Hammock RN Entered By: [name] [name] on 11/20/2021 10:32:27 -------------------------------------------------------------------------------- Problem List Details Patient Name: Date of Service: [name], [name] 11/20/2021 10:00 [place] Record Number: [id number] Patient Account Number: [id number] Date of Birth/Sex: Treating RN: [birth year] (88 y.o. 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 11/20/2021 10:00 A M Medical Record Number: [id number] Patient Account Number: [id number] Date of Birth/Sex: Treating RN: 03-08-1925 (88 y.o. [name] [name], [name] [name] Primary Care Provider: Veleta [name] Other Clinician: Referring Provider: Treating Provider/Extender: [name] [name] in Treatment: 5 Subjective History of Present Illness (HPI) ADMISSION 04/02/2021 This is a pleasant 88 year old man sent to our clinic by Dr. Lyndel Safe from the [facility] unit of [facility] [place] [facility]. He has a wound on the right posterior calf. I am uncertain about the duration of this however it looks to be chronic. They are using Santyl  covered with silver alginate he does not have any compression. The wound is 100% covered in very fibrinous adherent slough. It also appears that this is quite painful. The patient is exceptionally hard of hearing so it is difficult to get any additional history out of him. Past medical history includes hypertension, coronary artery disease, congestive heart failure, BPH, seizure disorder. He is nonambulatory for reasons that are not totally clear. He lives at the [facility] unit of [facility] [place]. ABI in our clinic was 1.03 on the right 11/2; 2-week follow-up. He is having the dressing changed at the facility. He arrives in the clinic with the area about the same in terms of  surface area but better looking surface we have been using Iodoflex under 3 layer compression. Unfortunately we still do not have any history of exactly how long this is been there or how it happened. 11/16; 2-week follow-up. Nice improvement in the wound. He is using Iodoflex over 3 layer compression. 11/30 2-week follow-up. [facility] at [facility] [place]. We have been using Iodoflex under 3 layer compression. They are not wrapping his leg properly but nevertheless the wound area has come down nicely. The patient requests coming here every 3 weeks instead of every 2 12/21; the area on the posterior right calf is fully epithelialized. The patient has chronic venous insufficiency. Readmission: 10-16-2021 upon evaluation today patient appears to be doing somewhat poorly in regard to the wounds on the bilateral lower extremities. He has been tolerating the dressing changes without complication. Fortunately there does not appear to be any evidence of active infection locally or systemically which is great news. Overall I am extremely pleased with where things stand. With that being said that obviously he has quite a bit of swelling and this is led to several areas of significant blistering over his bilateral lower  extremities all the wounds are superficial this is the good news. Patient continues to have issues with chronic venous hypertension bilaterally with ulcerations. He also has a history of hypertension and congestive heart failure. He previously tolerated 3 layer compression wraps without complication. He was last seen and discharged in our office June 05, 2021. 10-23-2021 upon evaluation today patient appears to be doing excellent in regard to his legs. They do look better although there is a lot of bright green drainage I do believe this likely represents a Pseudomonas infection and that was discussed with him as best I could today again he is extremely hard of hearing. I had to write most of what I wanted him to know down. 10-30-2021 upon evaluation 20 today patient appears to be doing much better in regard to his leg ulcers. He has been taking the Levaquin without complication and overall very pleased in that regard. Fortunately I do not see any evidence of infection locally or systemically which is great news. No fevers, chills, nausea, vomiting, or diarrhea. 11-06-2021 upon evaluation today patient appears to be doing excellent in regard to his wounds. In fact the left leg is almost completely healed the right leg is significantly smaller. Overall I do believe we are on the right track here. 6/7; 2-week follow-up. The patient's left leg is essentially closed. His superficial areas anteriorly look like they are doing very well with nice rims of epithelialization. UNFORTUNATELY he has a new wound on the right lateral malleolus the exact etiology of this is not really clear but he is mostly complaining of this wound. We have been using Xeroform under kerlix Coban The patient is in the [facility] [place]. I am uncertain whether he has 20/30 below-knee stockings or not but he is definitely going to need them. Objective Constitutional Sitting or standing Blood Pressure is  within target range for patient.. Pulse regular and within target range for patient.Marland Kitchen Respirations regular, non-labored and within target range.. Temperature is normal and within the target range for the patient.Marland Kitchen Appears in no distress. Vitals Time Taken: 10:03 AM, Temperature: 98.7 F, Pulse: 74 bpm, Respiratory Rate: 17 breaths/min, Blood Pressure: 134/74 mmHg. General Notes: Wound exam; I did not see anything open on the left leg. Chronic venous changes edema control  is adequate. On the right leg is 2 wounds anteriorly look quite healthy rims of epithelialization no debridement is required. He has a new wound over the right lateral malleolus the exact etiology of this is not clear whether this might of been a wrap injury or was a pressure issue trauma I am uncertain. Integumentary (Hair, Skin) Wound #2 status is Open. Original cause of wound was Blister. The date acquired was: 09/14/2021. The wound has been in treatment 5 weeks. The wound is located on the Left,Circumferential Lower Leg. The wound measures 1cm length x 1cm width x 0.1cm depth; 0.785cm^2 area and 0.079cm^3 volume. There is no tunneling or undermining noted. There is a medium amount of serosanguineous drainage noted. The wound margin is distinct with the outline attached to the wound base. There is medium (34-66%) red, pink granulation within the wound bed. There is a medium (34-66%) amount of necrotic tissue within the wound bed including Adherent Slough. Wound #3 status is Open. Original cause of wound was Blister. The date acquired was: 09/14/2021. The wound has been in treatment 5 weeks. The wound is located on the Right,Anterior Lower Leg. The wound measures 5.8cm length x 4cm width x 0.1cm depth; 18.221cm^2 area and 1.822cm^3 volume. There is Fat Layer (Subcutaneous Tissue) exposed. There is no tunneling or undermining noted. There is a medium amount of serosanguineous drainage noted. The wound margin is distinct with the outline  attached to the wound base. There is medium (34-66%) red, pink granulation within the wound bed. There is a medium (34-66%) amount of necrotic tissue within the wound bed including Adherent Slough. Wound #4 status is Open. Original cause of wound was Gradually Appeared. The date acquired was: 11/20/2021. The wound is located on the Right,Lateral Ankle. The wound measures 1cm length x 1cm width x 0.1cm depth; 0.785cm^2 area and 0.079cm^3 volume. There is no tunneling or undermining noted. There is a medium amount of serosanguineous drainage noted. The wound margin is distinct with the outline attached to the wound base. There is no granulation within the wound bed. There is a large (67-100%) amount of necrotic tissue within the wound bed including Eschar and Adherent Slough. 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 There was a Selective/Open Wound Non-Viable Tissue Debridement with a total area of 1 sq cm performed by [name] [name]., MD. With the following instrument(s): Curette to remove Viable and Non-Viable tissue/material. Material removed includes [facility] after achieving pain control using Lidocaine. No specimens were taken. A time out was conducted at 10:25, prior to the start of the procedure. A  Minimum amount of bleeding was controlled with Pressure. The procedure was tolerated well with a pain level of 0 throughout and a pain level of 0 following the procedure. Post Debridement Measurements: 1cm length x 1cm width x 0.1cm depth; 0.079cm^3 volume. Character of Wound/Ulcer Post Debridement is improved. Severity of Tissue Post Debridement is: Fat layer exposed. Post procedure  Diagnosis Wound #4: Same as Pre-Procedure Plan Follow-up Appointments: Return Appointment in 2 weeks. - 12/04/21 @ 0915 w/ Margarita Grizzle and Allayne Butcher Room # 9 Other: - ****PLEASE BE SURE TO ONLY USE KERLIX AND COBAN FOR COMPRESSION WRAPS TO BOTH LEGS and START WRAPS AT BOTTOM OF TOES AND GO UP TO JUST BELOW THE BEND OF THE KNEE**** Registered Nurse to change legs dressings every other day and PRN soiled dressings!!! Bathing/ Shower/ Hygiene: May shower with protection but do not get wound dressing(s) wet. Edema Control - Lymphedema / SCD / Other: Elevate legs to the level of the heart or above for 30 minutes daily and/or when sitting, a frequency of: Avoid standing for long periods of time. Compression stocking or Garment 20-30 mm/Hg pressure to: - PT will need 20 30 compression stockings for both legs once wounds are healed. ***FACILITY . TO ORDER*** WOUND #2: - Lower Leg Wound Laterality: Left, Circumferential Cleanser: Soap and Water Every Other Day/15 Days Discharge Instructions: May shower and wash wound with dial antibacterial soap and water prior to dressing change. Cleanser: Wound Cleanser Every Other Day/15 Days Discharge Instructions: Cleanse the wound with wound cleanser prior to applying a clean dressing using gauze sponges, not tissue or cotton balls. Peri-Wound Care: Zinc Oxide Ointment 30g tube Every Other Day/15 Days Discharge Instructions: Apply Zinc Oxide to periwound with each dressing change Peri-Wound Care: Sween Lotion (Moisturizing lotion) Every Other Day/15 Days Discharge Instructions: Apply  moisturizing lotion as directed Prim Dressing: Xeroform Occlusive Gauze Dressing, 4x4 in Every Other Day/15 Days ary Discharge Instructions: Apply to wound bed as instructed Secondary Dressing: ABD Pad, 5x9 Every Other Day/15 Days Discharge Instructions: Apply over primary dressing as directed. Com pression Wrap: Kerlix Roll 4.5x3.1 (in/yd) Every Other Day/15 Days Discharge Instructions: Apply Kerlix and Coban compression as directed. Com pression Wrap: Coban Self-Adherent Wrap 4x5 (in/yd) Every Other Day/15 Days Discharge Instructions: Apply over Kerlix as directed. WOUND #3: - Lower Leg Wound Laterality: Right, Anterior Cleanser: Soap and Water Every Other Day/15 Days Discharge Instructions: May shower and wash wound with dial antibacterial soap and water prior to dressing change. Cleanser: Wound Cleanser Every Other Day/15 Days Discharge Instructions: Cleanse the wound with wound cleanser prior to applying a clean dressing using gauze sponges, not tissue or cotton balls. Peri-Wound Care: Zinc Oxide Ointment 30g tube Every Other Day/15 Days Discharge Instructions: Apply Zinc Oxide to periwound with each dressing change Peri-Wound Care: Sween Lotion (Moisturizing lotion) Every Other Day/15 Days Discharge Instructions: Apply moisturizing lotion as directed Prim Dressing: Xeroform Occlusive Gauze Dressing, 4x4 in Every Other Day/15 Days ary Discharge Instructions: Apply to wound bed as instructed Secondary Dressing: ABD Pad, 5x9 Every Other Day/15 Days Discharge Instructions: Apply over primary dressing as directed. Com pression Wrap: Kerlix Roll 4.5x3.1 (in/yd) Every Other Day/15 Days Discharge Instructions: Apply Kerlix and Coban compression as directed. Com pression Wrap: Coban Self-Adherent Wrap 4x5 (in/yd) Every Other Day/15 Days Discharge Instructions: Apply over Kerlix as directed. WOUND #4: - Ankle Wound Laterality: Right, Lateral Cleanser: Soap and Water Every Other Day/15  Days Discharge Instructions: May shower and wash wound with dial antibacterial soap and water prior to dressing change. Cleanser: Wound Cleanser Every Other Day/15 Days Discharge Instructions: Cleanse the wound with wound cleanser prior to applying a clean dressing using gauze sponges, not tissue or cotton balls. Peri-Wound Care: Zinc Oxide Ointment 30g tube Every Other Day/15 Days Discharge Instructions: Apply Zinc Oxide to periwound with each dressing change Peri-Wound Care: Sween Lotion (Moisturizing lotion) Every  Other Day/15 Days Discharge Instructions: Apply moisturizing lotion as directed Prim Dressing: Xeroform Occlusive Gauze Dressing, 4x4 in Every Other Day/15 Days ary Discharge Instructions: Apply to wound bed as instructed Secondary Dressing: ABD Pad, 5x9 Every Other Day/15 Days Discharge Instructions: Apply over primary dressing as directed. Com pression Wrap: Kerlix Roll 4.5x3.1 (in/yd) Every Other Day/15 Days Discharge Instructions: Apply Kerlix and Coban compression as directed. Com pression Wrap: Coban Self-Adherent Wrap 4x5 (in/yd) Every Other Day/15 Days Discharge Instructions: Apply over Kerlix as directed. 1. We wrapped the left leg but we are going to have to contact the [facility] to see if he has lower extremity stockings. If not he is going to require 20/30 mmHg below-knee stockings 2. Continuing with Xeroform on all wounds including the new one on her on his right ankle/right malleolus. We will see how this does next week. He is supposed to be getting kerlix Coban compression but apparently they are adding Ace wraps which might of had something to do with the right ankle wound. 3. The patient is extremely hard of hearing very difficult to communicate with. Electronic Signature(s) Signed: 11/20/2021 5:09:19 PM By: [name] Ham MD Entered By: [name] [name] on 11/20/2021  10:42:33 -------------------------------------------------------------------------------- SuperBill Details Patient Name: Date of Service: [name] [name] 11/20/2021 Medical Record Number: [id number] Patient Account Number: [id number] Date of Birth/Sex: Treating RN: 1924-09-19 (88 y.o. [name] [name], Mathews Primary Care Provider: Veleta [name] Other Clinician: Referring Provider: Treating Provider/Extender: [name] [name] in Treatment: 5 Diagnosis Coding ICD-10 Codes Code Description [phone] Chronic venous hypertension (idiopathic) with ulcer and inflammation of right lower extremity I87.332 Chronic venous hypertension (idiopathic) with ulcer and inflammation of left lower extremity L97.822 Non-pressure chronic ulcer of other part of left lower leg with fat layer exposed L97.812 Non-pressure chronic ulcer of other part of right lower leg with fat layer exposed I10 Essential (primary) hypertension I50.42 Chronic combined systolic (congestive) and diastolic (congestive) heart failure Facility Procedures CPT4 Code: [id number] Description: [phone] - DEBRIDE WOUND 1ST 20 SQ CM OR < ICD-10 Diagnosis Description L97.812 Non-pressure chronic ulcer of other part of right lower leg with fat layer expos Modifier: ed Quantity: 1 Physician Procedures : CPT4 Code Description Modifier [id number] - WC PHYS DEBR WO ANESTH 20 SQ CM ICD-10 Diagnosis Description F79.038 Non-pressure chronic ulcer of other part of right lower leg with fat layer exposed Quantity: 1 Electronic Signature(s) Signed: 11/20/2021 5:09:19 PM By: [name] Ham MD Entered By: [name] [name] on 11/20/2021 10:42:46

## 2021-11-21 NOTE — Progress Notes (Signed)
[name] [name], [name] [name] ([id number]) Visit Report for 11/20/2021 Arrival Information Details Patient Name: Date of Service: [name] [name], [name] [name] 11/20/2021 10:00 A M Medical Record Number: [id number] Patient Account Number: [id number] Date of Birth/Sex: Treating RN: [birth year]-[name] [name] (88 y.o. [name] [name], [name] [name] Primary Care Geeta Dworkin: [name] Other Clinician: Referring Kamera Dubas: Treating Alianna Wurster/Extender: Carley Hammed in Treatment: 5 Visit Information History Since Last Visit Added or deleted any medications: No Patient Arrived: Wheel Chair Any new allergies or adverse reactions: No Arrival Time: 10:02 Had a fall or experienced change in No Accompanied By: self activities of daily living that may affect Transfer Assistance: Manual risk of falls: Patient Identification Verified: Yes Signs or symptoms of abuse/neglect since last visito No Secondary Verification Process Completed: Yes Hospitalized since last visit: No Patient Requires Transmission-Based Precautions: No Implantable device outside of the clinic excluding No Patient Has Alerts: Yes cellular tissue based products placed in the center Patient Alerts: ABI's 05/23 R: N/C since last visit: Has Dressing in Place as Prescribed: Yes Pain Present Now: No Electronic Signature(s) Signed: 11/21/2021 4:55:17 PM By: Rhae Hammock RN Entered By: Rhae Hammock on 11/20/2021 10:03:12 -------------------------------------------------------------------------------- Encounter Discharge Information Details Patient Name: Date of Service: [name] [name]. 11/20/2021 10:00 A M Medical Record Number: [id number] Patient Account Number: [id number] Date of Birth/Sex: Treating RN: [birth year][name] [name] (88 y.o. [name] [name], [name] [name] Primary Care Kayliegh Boyers: [name] Other Clinician: Referring Jurnei Latini: Treating Hatley Henegar/Extender: Carley Hammed in Treatment: 5 Encounter Discharge Information Items  Post Procedure Vitals Discharge Condition: Stable Temperature (F): 98.7 Ambulatory Status: Wheelchair Pulse (bpm): 74 Discharge Destination: [name] [name] Respiratory Rate (breaths/min): 17 Telephoned: No Blood Pressure (mmHg): 134/74 Orders Sent: Yes Transportation: Private Auto Accompanied By: self Schedule Follow-up Appointment: Yes Clinical Summary of Care: Patient Declined Electronic Signature(s) Signed: 11/21/2021 4:55:17 PM By: Rhae Hammock RN Entered By: Rhae Hammock on 11/20/2021 10:33:39 -------------------------------------------------------------------------------- Lower Extremity Assessment Details Patient Name: Date of Service: [name] [name]. 11/20/2021 10:00 A M Medical Record Number: [id number] Patient Account Number: [id number] Date of Birth/Sex: Treating RN: [birth year] [name] [name] (88 y.o. [name] [name], [name] [name] Primary Care Inella Kuwahara: [name] Other Clinician: Referring Harout Scheurich: Treating Moxie Kalil/Extender: Carley Hammed in Treatment: 5 Edema Assessment Assessed: Shirlyn Goltz: Yes] Patrice Paradise: Yes] Edema: [Left: Yes] [Right: Yes] Calf Left: Right: Point of Measurement: 34 cm From Medial Instep 36 cm 35 cm Ankle Left: Right: Point of Measurement: 9 cm From Medial Instep 22 cm 22 cm Vascular Assessment Pulses: Dorsalis Pedis Palpable: [Left:Yes] [Right:Yes] Posterior Tibial Palpable: [Left:Yes Yes] [Right:Yes Yes] Electronic Signature(s) Signed: 11/21/2021 4:55:17 PM By: Rhae Hammock RN Entered By: Rhae Hammock on 11/20/2021 10:07:09 -------------------------------------------------------------------------------- Multi Wound Chart Details Patient Name: Date of Service: [name] [name]. 11/20/2021 10:00 [place] Record Number: [id number] Patient Account Number: [id number] Date of Birth/Sex: Treating RN: [birth year], [name] [name] (88 y.o. [name] [name], [name] [name] Primary Care Dyllon Henken: [name] Other  Clinician: Referring Ellyce Lafevers: Treating Bettyjean Stefanski/Extender: Carley Hammed in Treatment: 5 Vital Signs Height(in): Pulse(bpm): 4 Weight(lbs): Blood Pressure(mmHg): 134/74 Body Mass Index(BMI): Temperature(F): 98.7 Respiratory Rate(breaths/min): 17 Photos: [2:Left, Circumferential Lower Leg] [3:Right, Anterior Lower Leg] [4:Right, Lateral Ankle] Wound Location: [2:Blister] [3:Blister] [4:Gradually Appeared] Wounding Event: [2:Venous Leg Ulcer] [3:Venous Leg Ulcer] [4:Venous Leg Ulcer] Primary Etiology: [2:Arrhythmia, Congestive Heart Failure,] [3:Arrhythmia, Congestive Heart Failure, Arrhythmia, Congestive Heart Failure,] Comorbid History: [2:Coronary Artery Disease, Hypertension, Peripheral Venous Disease, Osteoarthritis, Seizure Disorder 09/14/2021] [3:Coronary Artery Disease, Hypertension, Peripheral Venous Disease, Osteoarthritis, Seizure Disorder 09/14/2021] [4:Coronary  Artery Disease, Hypertension,  Peripheral Venous Disease, Osteoarthritis, Seizure Disorder 11/20/2021] Date Acquired: [2:5] [3:5] [4:0] Weeks of Treatment: [2:Open] [3:Open] [4:Open] Wound Status: [2:No] [3:No] [4:No] Wound Recurrence: [2:Yes] [3:Yes] [4:No] Clustered Wound: [2:7] [3:2] [4:N/A] Clustered Quantity: [2:1x1x0.1] [3:5.8x4x0.1] [4:1x1x0.1] Measurements L x W x D (cm) [2:0.785] [3:18.221] [4:0.785] A (cm) : rea [2:0.079] [3:1.822] [4:0.079] Volume (cm) : [2:99.70%] [3:95.60%] [4:0.00%] % Reduction in A [2:rea: 99.70%] [3:95.60%] [4:0.00%] % Reduction in Volume: [2:Full Thickness With Exposed Support] [3:Full Thickness With Exposed Support Full Thickness With Exposed Support] Classification: [2:Structures Medium] [3:Structures Medium] [4:Structures Medium] Exudate A mount: [2:Serosanguineous] [3:Serosanguineous] [4:Serosanguineous] Exudate Type: [2:red, brown] [3:red, brown] [4:red, brown] Exudate Color: [2:Distinct, outline attached] [3:Distinct, outline attached] [4:Distinct,  outline attached] Wound Margin: [2:Medium (34-66%)] [3:Medium (34-66%)] [4:None Present (0%)] Granulation A mount: [2:Red, Pink] [3:Red, Pink] [4:N/A] Granulation Quality: [2:Medium (34-66%)] [3:Medium (34-66%)] [4:Large (67-100%)] Necrotic A mount: [2:Adherent Slough] [3:Adherent Slough] [4:Eschar, Adherent Slough] Necrotic Tissue: [2:Fascia: No] [3:Fat Layer (Subcutaneous Tissue): Yes N/A] Exposed Structures: [2:Fat Layer (Subcutaneous Tissue): No Tendon: No Muscle: No Joint: No Bone: No Small (1-33%)] [3:Fascia: No Tendon: No Muscle: No Joint: No Bone: No None] [4:None] Epithelialization: [2:N/A] [3:N/A] [4:Debridement - Selective/Open Wound] Debridement: Pre-procedure Verification/Time Out N/A [3:N/A] [4:10:25] Taken: [2:N/A] [3:N/A] [4:Lidocaine] Pain Control: [2:N/A] [3:N/A] [4:Slough] Tissue Debrided: [2:N/A] [3:N/A] [4:Non-Viable Tissue] Level: [2:N/A] [3:N/A] [4:1] Debridement A (sq cm): [2:rea N/A] [3:N/A] [4:Curette] Instrument: [2:N/A] [3:N/A] [4:Minimum] Bleeding: [2:N/A] [3:N/A] [4:Pressure] Hemostasis A chieved: [2:N/A] [3:N/A] [4:0] Procedural Pain: [2:N/A] [3:N/A] [4:0] Post Procedural Pain: [2:N/A] [3:N/A] [4:Procedure was tolerated well] Debridement Treatment Response: [2:N/A] [3:N/A] [4:1x1x0.1] Post Debridement Measurements L x W x D (cm) [2:N/A] [3:N/A] [4:0.079] Post Debridement Volume: (cm) [2:N/A] [3:N/A] [4:Debridement] Treatment Notes Wound #2 (Lower Leg) Wound Laterality: Left, Circumferential Cleanser Soap and Water Discharge Instruction: May shower and wash wound with dial antibacterial soap and water prior to dressing change. Wound Cleanser Discharge Instruction: Cleanse the wound with wound cleanser prior to applying a clean dressing using gauze sponges, not tissue or cotton balls. Peri-Wound Care Zinc Oxide Ointment 30g tube Discharge Instruction: Apply Zinc Oxide to periwound with each dressing change Sween Lotion (Moisturizing  lotion) Discharge Instruction: Apply moisturizing lotion as directed Topical Primary Dressing Xeroform Occlusive Gauze Dressing, 4x4 in Discharge Instruction: Apply to wound bed as instructed Secondary Dressing ABD Pad, 5x9 Discharge Instruction: Apply over primary dressing as directed. Secured With Compression Wrap Kerlix Roll 4.5x3.1 (in/yd) Discharge Instruction: Apply Kerlix and Coban compression as directed. Coban Self-Adherent Wrap 4x5 (in/yd) Discharge Instruction: Apply over Kerlix as directed. Compression Stockings Add-Ons Wound #3 (Lower Leg) Wound Laterality: Right, Anterior Cleanser Soap and Water Discharge Instruction: May shower and wash wound with dial antibacterial soap and water prior to dressing change. Wound Cleanser Discharge Instruction: Cleanse the wound with wound cleanser prior to applying a clean dressing using gauze sponges, not tissue or cotton balls. Peri-Wound Care Zinc Oxide Ointment 30g tube Discharge Instruction: Apply Zinc Oxide to periwound with each dressing change Sween Lotion (Moisturizing lotion) Discharge Instruction: Apply moisturizing lotion as directed Topical Primary Dressing Xeroform Occlusive Gauze Dressing, 4x4 in Discharge Instruction: Apply to wound bed as instructed Secondary Dressing ABD Pad, 5x9 Discharge Instruction: Apply over primary dressing as directed. Secured With Compression Wrap Kerlix Roll 4.5x3.1 (in/yd) Discharge Instruction: Apply Kerlix and Coban compression as directed. Coban Self-Adherent Wrap 4x5 (in/yd) Discharge Instruction: Apply over Kerlix as directed. Compression Stockings Add-Ons Wound #4 (Ankle) Wound Laterality: Right, Lateral Cleanser Soap and Water Discharge Instruction: May shower and wash wound with dial  antibacterial soap and water prior to dressing change. Wound Cleanser Discharge Instruction: Cleanse the wound with wound cleanser prior to applying a clean dressing using gauze sponges,  not tissue or cotton balls. Peri-Wound Care Zinc Oxide Ointment 30g tube Discharge Instruction: Apply Zinc Oxide to periwound with each dressing change Sween Lotion (Moisturizing lotion) Discharge Instruction: Apply moisturizing lotion as directed Topical Primary Dressing Xeroform Occlusive Gauze Dressing, 4x4 in Discharge Instruction: Apply to wound bed as instructed Secondary Dressing ABD Pad, 5x9 Discharge Instruction: Apply over primary dressing as directed. Secured With Compression Wrap Kerlix Roll 4.5x3.1 (in/yd) Discharge Instruction: Apply Kerlix and Coban compression as directed. Coban Self-Adherent Wrap 4x5 (in/yd) Discharge Instruction: Apply over Kerlix as directed. Compression Stockings Add-Ons Electronic Signature(s) Signed: 11/20/2021 5:09:19 PM By: Linton Ham MD Signed: 11/21/2021 4:55:17 PM By: Rhae Hammock RN Entered By: Linton Ham on 11/20/2021 10:37:27 -------------------------------------------------------------------------------- Multi-Disciplinary Care Plan Details Patient Name: Date of Service: [name] [name], [name] [name] 11/20/2021 10:00 A M Medical Record Number: [id number] Patient Account Number: [id number] Date of Birth/Sex: Treating RN: [birth year], [name] [name] (88 y.o. [name] [name], [name] [name] Primary Care Cameka Rae: [name] Other Clinician: Referring Sheretha Shadd: Treating Ada Holness/Extender: Carley Hammed in Treatment: 5 Active Inactive Wound/Skin Impairment Nursing Diagnoses: Impaired tissue integrity Knowledge deficit related to ulceration/compromised skin integrity Goals: Patient will have a decrease in wound volume by X% from date: (specify in notes) Date Initiated: 10/16/2021 Target Resolution Date: 11/21/2021 Goal Status: Active Patient/caregiver will verbalize understanding of skin care regimen Date Initiated: 10/16/2021 Target Resolution Date: 11/21/2021 Goal Status: Active Ulcer/skin breakdown will have a volume reduction of  30% by week 4 Date Initiated: 10/16/2021 Target Resolution Date: 11/21/2021 Goal Status: Active Interventions: Assess patient/caregiver ability to obtain necessary supplies Assess patient/caregiver ability to perform ulcer/skin care regimen upon admission and as needed Assess ulceration(s) every visit Notes: Electronic Signature(s) Signed: 11/21/2021 4:55:17 PM By: Rhae Hammock RN Entered By: Rhae Hammock on 11/20/2021 10:30:18 -------------------------------------------------------------------------------- Pain Assessment Details Patient Name: Date of Service: [name] [name]. 11/20/2021 10:00 A M Medical Record Number: [id number] Patient Account Number: [id number] Date of Birth/Sex: Treating RN: [birth year], [name] [name] (88 y.o. [name] [name], [name] [name] Primary Care Mikael Skoda: [name] Other Clinician: Referring Compton Brigance: Treating Jo Cerone/Extender: Carley Hammed in Treatment: 5 Active Problems Location of Pain Severity and Description of Pain Patient Has Paino No Site Locations Pain Management and Medication Current Pain Management: Electronic Signature(s) Signed: 11/21/2021 4:55:17 PM By: Rhae Hammock RN Entered By: Rhae Hammock on 11/20/2021 10:05:19 -------------------------------------------------------------------------------- Patient/Caregiver Education Details Patient Name: Date of Service: [name] [name] 6/7/2023andnbsp10:00 [facility] [place] Record Number: [id number] Patient Account Number: [id number] Date of Birth/Gender: Treating RN: [birth year]-[name] [name] (88 y.o. [name] [name] Primary Care Physician: [name] Other Clinician: Referring Physician: Treating Physician/Extender: Carley Hammed in Treatment: 5 Education Assessment Education Provided To: Patient Education Topics Provided Wound/Skin Impairment: Methods: Explain/Verbal Responses: Reinforcements needed, State content correctly Electronic  Signature(s) Signed: 11/21/2021 4:55:17 PM By: Rhae Hammock RN Entered By: Rhae Hammock on 11/20/2021 10:30:47 -------------------------------------------------------------------------------- Wound Assessment Details Patient Name: Date of Service: [name] [name]. 11/20/2021 10:00 A M Medical Record Number: [id number] Patient Account Number: [id number] Date of Birth/Sex: Treating RN: [name] [name][birth year] (88 y.o. [name] [name], [name] [name] Primary Care Jehan Bonano: [name] Other Clinician: Referring Josiah Nieto: Treating Lyda Colcord/Extender: Carley Hammed in Treatment: 5 Wound Status Wound Number: 2 Primary Venous Leg Ulcer Etiology: Wound Location: Left, Circumferential Lower Leg Wound Open Wounding Event: Blister Status: Date Acquired: 09/14/2021 Comorbid Arrhythmia, Congestive  Heart Failure, Coronary Artery Disease, Weeks Of Treatment: 5 History: Hypertension, Peripheral Venous Disease, Osteoarthritis, Seizure Clustered Wound: Yes Disorder Photos Wound Measurements Length: (cm) 1 Width: (cm) 1 Depth: (cm) 0.1 Clustered Quantity: 7 Area: (cm) 0.785 Volume: (cm) 0.079 % Reduction in Area: 99.7% % Reduction in Volume: 99.7% Epithelialization: Small (1-33%) Tunneling: No Undermining: No Wound Description Classification: Full Thickness With Exposed Support Structures Wound Margin: Distinct, outline attached Exudate Amount: Medium Exudate Type: Serosanguineous Exudate Color: red, brown Foul Odor After Cleansing: No Slough/Fibrino Yes Wound Bed Granulation Amount: Medium (34-66%) Exposed Structure Granulation Quality: Red, Pink Fascia Exposed: No Necrotic Amount: Medium (34-66%) Fat Layer (Subcutaneous Tissue) Exposed: No Necrotic Quality: Adherent Slough Tendon Exposed: No Muscle Exposed: No Joint Exposed: No Bone Exposed: No Treatment Notes Wound #2 (Lower Leg) Wound Laterality: Left, Circumferential Cleanser Soap and Water Discharge  Instruction: May shower and wash wound with dial antibacterial soap and water prior to dressing change. Wound Cleanser Discharge Instruction: Cleanse the wound with wound cleanser prior to applying a clean dressing using gauze sponges, not tissue or cotton balls. Peri-Wound Care Zinc Oxide Ointment 30g tube Discharge Instruction: Apply Zinc Oxide to periwound with each dressing change Sween Lotion (Moisturizing lotion) Discharge Instruction: Apply moisturizing lotion as directed Topical Primary Dressing Xeroform Occlusive Gauze Dressing, 4x4 in Discharge Instruction: Apply to wound bed as instructed Secondary Dressing ABD Pad, 5x9 Discharge Instruction: Apply over primary dressing as directed. Secured With Compression Wrap Kerlix Roll 4.5x3.1 (in/yd) Discharge Instruction: Apply Kerlix and Coban compression as directed. Coban Self-Adherent Wrap 4x5 (in/yd) Discharge Instruction: Apply over Kerlix as directed. Compression Stockings Add-Ons Electronic Signature(s) Signed: 11/21/2021 4:55:17 PM By: Rhae Hammock RN Entered By: Rhae Hammock on 11/20/2021 10:11:49 -------------------------------------------------------------------------------- Wound Assessment Details Patient Name: Date of Service: [name] [name] 11/20/2021 10:00 A M Medical Record Number: [id number] Patient Account Number: [id number] Date of Birth/Sex: Treating RN: [birth year]-[name] [name] (88 y.o. [name] [name], [name] [name] Primary Care Hari Casaus: [name] Other Clinician: Referring Maigen Mozingo: Treating Jeff Frieden/Extender: Carley Hammed in Treatment: 5 Wound Status Wound Number: 3 Primary Venous Leg Ulcer Etiology: Wound Location: Right, Anterior Lower Leg Wound Open Wounding Event: Blister Status: Date Acquired: 09/14/2021 Comorbid Arrhythmia, Congestive Heart Failure, Coronary Artery Disease, Weeks Of Treatment: 5 History: Hypertension, Peripheral Venous Disease, Osteoarthritis,  Seizure Clustered Wound: Yes Disorder Photos Wound Measurements Length: (cm) 5.8 Width: (cm) 4 Depth: (cm) 0.1 Clustered Quantity: 2 Area: (cm) 18.221 Volume: (cm) 1.822 % Reduction in Area: 95.6% % Reduction in Volume: 95.6% Epithelialization: None Tunneling: No Undermining: No Wound Description Classification: Full Thickness With Exposed Support Structures Wound Margin: Distinct, outline attached Exudate Amount: Medium Exudate Type: Serosanguineous Exudate Color: red, brown Foul Odor After Cleansing: No Slough/Fibrino Yes Wound Bed Granulation Amount: Medium (34-66%) Exposed Structure Granulation Quality: Red, Pink Fascia Exposed: No Necrotic Amount: Medium (34-66%) Fat Layer (Subcutaneous Tissue) Exposed: Yes Necrotic Quality: Adherent Slough Tendon Exposed: No Muscle Exposed: No Joint Exposed: No Bone Exposed: No Treatment Notes Wound #3 (Lower Leg) Wound Laterality: Right, Anterior Cleanser Soap and Water Discharge Instruction: May shower and wash wound with dial antibacterial soap and water prior to dressing change. Wound Cleanser Discharge Instruction: Cleanse the wound with wound cleanser prior to applying a clean dressing using gauze sponges, not tissue or cotton balls. Peri-Wound Care Zinc Oxide Ointment 30g tube Discharge Instruction: Apply Zinc Oxide to periwound with each dressing change Sween Lotion (Moisturizing lotion) Discharge Instruction: Apply moisturizing lotion as directed Topical Primary Dressing Xeroform Occlusive Gauze Dressing, 4x4 in  Discharge Instruction: Apply to wound bed as instructed Secondary Dressing ABD Pad, 5x9 Discharge Instruction: Apply over primary dressing as directed. Secured With Compression Wrap Kerlix Roll 4.5x3.1 (in/yd) Discharge Instruction: Apply Kerlix and Coban compression as directed. Coban Self-Adherent Wrap 4x5 (in/yd) Discharge Instruction: Apply over Kerlix as directed. Compression  Stockings Add-Ons Electronic Signature(s) Signed: 11/21/2021 4:55:17 PM By: Rhae Hammock RN Entered By: Rhae Hammock on 11/20/2021 10:12:29 -------------------------------------------------------------------------------- Wound Assessment Details Patient Name: Date of Service: [name] [name] 11/20/2021 10:00 A M Medical Record Number: [id number] Patient Account Number: [id number] Date of Birth/Sex: Treating RN: [birth year] (88 y.o. [name] [name], [name] [name] Primary Care Marilu Rylander: [name] Other Clinician: Referring Kathy Wares: Treating Justan Gaede/Extender: Carley Hammed in Treatment: 5 Wound Status Wound Number: 4 Primary Venous Leg Ulcer Etiology: Wound Location: Right, Lateral Ankle Wound Open Wounding Event: Gradually Appeared Status: Date Acquired: 11/20/2021 Comorbid Arrhythmia, Congestive Heart Failure, Coronary Artery Disease, Weeks Of Treatment: 0 History: Hypertension, Peripheral Venous Disease, Osteoarthritis, Seizure Clustered Wound: No Disorder Photos Wound Measurements Length: (cm) 1 Width: (cm) 1 Depth: (cm) 0.1 Area: (cm) 0.785 Volume: (cm) 0.079 % Reduction in Area: 0% % Reduction in Volume: 0% Epithelialization: None Tunneling: No Undermining: No Wound Description Classification: Full Thickness With Exposed Support Structures Wound Margin: Distinct, outline attached Exudate Amount: Medium Exudate Type: Serosanguineous Exudate Color: red, brown Foul Odor After Cleansing: No Slough/Fibrino Yes Wound Bed Granulation Amount: None Present (0%) Necrotic Amount: Large (67-100%) Necrotic Quality: Eschar, Adherent Slough Treatment Notes Wound #4 (Ankle) Wound Laterality: Right, Lateral Cleanser Soap and Water Discharge Instruction: May shower and wash wound with dial antibacterial soap and water prior to dressing change. Wound Cleanser Discharge Instruction: Cleanse the wound with wound cleanser prior to applying a  clean dressing using gauze sponges, not tissue or cotton balls. Peri-Wound Care Zinc Oxide Ointment 30g tube Discharge Instruction: Apply Zinc Oxide to periwound with each dressing change Sween Lotion (Moisturizing lotion) Discharge Instruction: Apply moisturizing lotion as directed Topical Primary Dressing Xeroform Occlusive Gauze Dressing, 4x4 in Discharge Instruction: Apply to wound bed as instructed Secondary Dressing ABD Pad, 5x9 Discharge Instruction: Apply over primary dressing as directed. Secured With Compression Wrap Kerlix Roll 4.5x3.1 (in/yd) Discharge Instruction: Apply Kerlix and Coban compression as directed. Coban Self-Adherent Wrap 4x5 (in/yd) Discharge Instruction: Apply over Kerlix as directed. Compression Stockings Add-Ons Electronic Signature(s) Signed: 11/21/2021 4:55:17 PM By: Rhae Hammock RN Entered By: Rhae Hammock on 11/20/2021 10:12:10 -------------------------------------------------------------------------------- Vitals Details Patient Name: Date of Service: [name] [name]. 11/20/2021 10:00 [place] Record Number: [id number] Patient Account Number: [id number] Date of Birth/Sex: Treating RN: [birth year]-[name] [name] (88 y.o. [name] [name], [name] [name] Primary Care Yamaira Spinner: [name] Other Clinician: Referring Yoshiharu Brassell: Treating Eutha Cude/Extender: Carley Hammed in Treatment: 5 Vital Signs Time Taken: 10:03 Temperature (F): 98.7 Pulse (bpm): 74 Respiratory Rate (breaths/min): 17 Blood Pressure (mmHg): 134/74 Reference Range: 80 - 120 mg / dl Electronic Signature(s) Signed: 11/21/2021 4:55:17 PM By: Rhae Hammock RN Entered By: Rhae Hammock on 11/20/2021 10:05:14

## 2021-11-26 NOTE — Progress Notes (Signed)
[name] [name], [name] [name] ([id number]) Visit Report for 10/23/2021 Arrival Information Details Patient Name: Date of Service: [name] [name], [name] [name] 10/23/2021 10:00 A M Medical Record Number: [id number] Patient Account Number: [id number] Date of Birth/Sex: Treating RN: 1924/12/26 (88 y.o. [name] [name] Primary Care Jefferson Fullam: [name] Other Clinician: Referring [name] [name]: Treating [name] [name]/Extender: [name] [name] in Treatment: 1 Visit Information History Since Last Visit Added or deleted any medications: No Patient Arrived: Wheel Chair Any new allergies or adverse reactions: No Arrival Time: 10:06 Had a fall or experienced change in No Accompanied By: self activities of daily living that may affect Transfer Assistance: Manual risk of falls: Patient Identification Verified: Yes Signs or symptoms of abuse/neglect since No Secondary Verification Process Completed: Yes last visito Patient Requires Transmission-Based Precautions: No Hospitalized since last visit: No Patient Has Alerts: Yes Implantable device outside of the clinic No Patient Alerts: ABI's 05/23 R: N/C excluding cellular tissue based products placed in the center since last visit: Has Dressing in Place as Prescribed: Yes Has Compression in Place as Prescribed: Yes Has Footwear/Offloading in Place as Yes Prescribed: Left: Surgical Shoe with Pressure Relief Insole Right: Surgical Shoe with Pressure Relief Insole Pain Present Now: No Electronic Signature(s) Signed: 11/26/2021 8:46:54 AM By: Erenest Blank Entered By: Erenest Blank on 10/23/2021 10:07:05 -------------------------------------------------------------------------------- Clinic Level of Care Assessment Details Patient Name: Date of Service: [name] [name] 10/23/2021 10:00 [place] Record Number: [id number] Patient Account Number: [id number] Date of Birth/Sex: Treating RN: 02/14/1925 (88 y.o. [name] Pop,  [name] Primary Care Shakeya Kerkman: [name] Other Clinician: Referring Diantha Paxson: Treating Esha Fincher/Extender: [name] [name] in Treatment: 1 Clinic Level of Care Assessment Items TOOL 4 Quantity Score X- 1 0 Use when only an EandM is performed on FOLLOW-UP visit ASSESSMENTS - Nursing Assessment / Reassessment X- 1 10 Reassessment of Co-morbidities (includes updates in patient status) X- 1 5 Reassessment of Adherence to Treatment Plan ASSESSMENTS - Wound and Skin A ssessment / Reassessment X - Simple Wound Assessment / Reassessment - one wound 1 5 '[]'$  - 0 Complex Wound Assessment / Reassessment - multiple wounds X- 1 10 Dermatologic / Skin Assessment (not related to wound area) ASSESSMENTS - Focused Assessment X- 2 5 Circumferential Edema Measurements - multi extremities '[]'$  - 0 Nutritional Assessment / Counseling / Intervention '[]'$  - 0 Lower Extremity Assessment (monofilament, tuning fork, pulses) '[]'$  - 0 Peripheral Arterial Disease Assessment (using hand held doppler) ASSESSMENTS - Ostomy and/or Continence Assessment and Care '[]'$  - 0 Incontinence Assessment and Management '[]'$  - 0 Ostomy Care Assessment and Management (repouching, etc.) PROCESS - Coordination of Care '[]'$  - 0 Simple Patient / Family Education for ongoing care X- 1 20 Complex (extensive) Patient / Family Education for ongoing care X- 1 10 Staff obtains Programmer, systems, Records, T Results / Process Orders est X- 1 10 Staff telephones HHA, Nursing Homes / Clarify orders / etc '[]'$  - 0 Routine Transfer to another Facility (non-emergent condition) '[]'$  - 0 Routine Hospital Admission (non-emergent condition) '[]'$  - 0 New Admissions / Biomedical engineer / Ordering NPWT Apligraf, etc. , '[]'$  - 0 Emergency Hospital Admission (emergent condition) '[]'$  - 0 Simple Discharge Coordination X- 1 15 Complex (extensive) Discharge Coordination PROCESS - Special Needs '[]'$  - 0 Pediatric / Minor Patient  Management '[]'$  - 0 Isolation Patient Management '[]'$  - 0 Hearing / Language / Visual special needs '[]'$  - 0 Assessment of Community assistance (transportation, D/C planning, etc.) '[]'$  - 0 Additional assistance / Altered  mentation '[]'$  - 0 Support Surface(s) Assessment (bed, cushion, seat, etc.) INTERVENTIONS - Wound Cleansing / Measurement '[]'$  - 0 Simple Wound Cleansing - one wound X- 2 5 Complex Wound Cleansing - multiple wounds X- 1 5 Wound Imaging (photographs - any number of wounds) '[]'$  - 0 Wound Tracing (instead of photographs) '[]'$  - 0 Simple Wound Measurement - one wound X- 2 5 Complex Wound Measurement - multiple wounds INTERVENTIONS - Wound Dressings '[]'$  - 0 Small Wound Dressing one or multiple wounds X- 2 15 Medium Wound Dressing one or multiple wounds '[]'$  - 0 Large Wound Dressing one or multiple wounds X- 1 5 Application of Medications - topical '[]'$  - 0 Application of Medications - injection INTERVENTIONS - Miscellaneous '[]'$  - 0 External ear exam '[]'$  - 0 Specimen Collection (cultures, biopsies, blood, body fluids, etc.) '[]'$  - 0 Specimen(s) / Culture(s) sent or taken to Lab for analysis X- 1 10 Patient Transfer (multiple staff / Civil Service fast streamer / Similar devices) '[]'$  - 0 Simple Staple / Suture removal (25 or less) '[]'$  - 0 Complex Staple / Suture removal (26 or more) '[]'$  - 0 Hypo / Hyperglycemic Management (close monitor of Blood Glucose) '[]'$  - 0 Ankle / Brachial Index (ABI) - do not check if billed separately X- 1 5 Vital Signs Has the patient been seen at the hospital within the last three years: Yes Total Score: 170 Level Of Care: New/Established - Level 5 Electronic Signature(s) Signed: 10/23/2021 5:07:52 PM By: Rhae Hammock RN Entered By: Rhae Hammock on 10/23/2021 12:17:55 -------------------------------------------------------------------------------- Encounter Discharge Information Details Patient Name: Date of Service: [name] [name]. 10/23/2021  10:00 A M Medical Record Number: [id number] Patient Account Number: [id number] Date of Birth/Sex: Treating RN: 1925/04/01 (88 y.o. [name] [name] Primary Care Nilsa Macht: [name] Other Clinician: Referring Kodey Xue: Treating Sandhya Denherder/Extender: [name] [name] in Treatment: 1 Encounter Discharge Information Items Discharge Condition: Stable Ambulatory Status: Wheelchair Discharge Destination: Home Transportation: Private Auto Accompanied By: self Schedule Follow-up Appointment: Yes Clinical Summary of Care: Patient Declined Electronic Signature(s) Signed: 10/23/2021 5:07:52 PM By: Rhae Hammock RN Entered By: Rhae Hammock on 10/23/2021 12:19:14 -------------------------------------------------------------------------------- Lower Extremity Assessment Details Patient Name: Date of Service: [name] [name]. 10/23/2021 10:00 A M Medical Record Number: [id number] Patient Account Number: [id number] Date of Birth/Sex: Treating RN: 1925/03/11 (88 y.o. [name] [name] Primary Care Cheral Cappucci: [name] Other Clinician: Referring Talal Fritchman: Treating Dianey Suchy/Extender: Patricia Nettle, Meredith Staggers Weeks in Treatment: 1 Edema Assessment Assessed: Shirlyn Goltz: Yes] Patrice Paradise: Yes] Edema: [Left: Yes] [Right: Yes] Calf Left: Right: Point of Measurement: 34 cm From Medial Instep 34 cm 33.3 cm Ankle Left: Right: Point of Measurement: 9 cm From Medial Instep 23.5 cm 23 cm Vascular Assessment Pulses: Dorsalis Pedis Palpable: [Left:Yes] [Right:Yes] Posterior Tibial Palpable: [Left:Yes] [Right:Yes] Electronic Signature(s) Signed: 10/23/2021 5:07:52 PM By: Rhae Hammock RN Entered By: Rhae Hammock on 10/23/2021 10:33:16 -------------------------------------------------------------------------------- [place] Details Patient Name: Date of Service: [name] [name]. 10/23/2021 10:00 A M Medical Record Number:  [id number] Patient Account Number: [id number] Date of Birth/Sex: Treating RN: 1925/05/25 (88 y.o. [name] [name] Primary Care Kohle Winner: [name] Other Clinician: Referring Vilma Will: Treating Keaisha Sublette/Extender: [name] [name] in Treatment: 1 Active Inactive Orientation to the Wound Care Program Nursing Diagnoses: Knowledge deficit related to the wound healing center program Goals: Patient/caregiver will verbalize understanding of the [place] Date Initiated: 10/16/2021 Target Resolution Date: 11/20/2021 Goal Status: Active Interventions: Provide education on orientation to  the wound center Notes: Wound/Skin Impairment Nursing Diagnoses: Impaired tissue integrity Knowledge deficit related to ulceration/compromised skin integrity Goals: Patient will have a decrease in wound volume by X% from date: (specify in notes) Date Initiated: 10/16/2021 Target Resolution Date: 11/21/2021 Goal Status: Active Patient/caregiver will verbalize understanding of skin care regimen Date Initiated: 10/16/2021 Target Resolution Date: 11/21/2021 Goal Status: Active Ulcer/skin breakdown will have a volume reduction of 30% by week 4 Date Initiated: 10/16/2021 Target Resolution Date: 11/21/2021 Goal Status: Active Interventions: Assess patient/caregiver ability to obtain necessary supplies Assess patient/caregiver ability to perform ulcer/skin care regimen upon admission and as needed Assess ulceration(s) every visit Notes: Electronic Signature(s) Signed: 10/23/2021 5:07:52 PM By: Rhae Hammock RN Entered By: Rhae Hammock on 10/23/2021 12:16:33 -------------------------------------------------------------------------------- Pain Assessment Details Patient Name: Date of Service: [name] [name]. 10/23/2021 10:00 [place] Record Number: [id number] Patient Account Number: [id number] Date of Birth/Sex: Treating RN: 27-Apr-1925 (88 y.o. [name] [name] Primary Care Dashonda Bonneau: [name] Other Clinician: Referring Apolo Cutshaw: Treating Juandedios Dudash/Extender: Patricia Nettle, Ihor Gully in Treatment: 1 Active Problems Location of Pain Severity and Description of Pain Patient Has Paino No Site Locations Pain Management and Medication Current Pain Management: Electronic Signature(s) Signed: 10/23/2021 5:07:52 PM By: Rhae Hammock RN Signed: 11/26/2021 8:46:54 AM By: Erenest Blank Entered By: Erenest Blank on 10/23/2021 10:09:47 -------------------------------------------------------------------------------- Patient/Caregiver Education Details Patient Name: Date of Service: [name] [name] 5/10/2023andnbsp10:00 Between Record Number: [id number] Patient Account Number: [id number] Date of Birth/Gender: Treating RN: [birth year] (88 y.o. Erie Noe Primary Care Physician: [name] Other Clinician: Referring Physician: Treating Physician/Extender: [name] [name] in Treatment: 1 Education Assessment Education Provided To: Patient Education Topics Provided [place]: o Methods: Explain/Verbal Responses: Reinforcements needed, State content correctly Electronic Signature(s) Signed: 10/23/2021 5:07:52 PM By: Rhae Hammock RN Entered By: Rhae Hammock on 10/23/2021 10:51:13 -------------------------------------------------------------------------------- Wound Assessment Details Patient Name: Date of Service: [name] [name]. 10/23/2021 10:00 [place] Record Number: [id number] Patient Account Number: [id number] Date of Birth/Sex: Treating RN: 1924/07/08 (88 y.o. [name] [name] Primary Care Donevin Sainsbury: [name] Other Clinician: Referring Shondell Fabel: Treating Daune Colgate/Extender: Patricia Nettle, Meredith Staggers Weeks in Treatment: 1 Wound Status Wound Number: 2 Primary Venous Leg Ulcer Etiology: Wound Location: Left,  Circumferential Lower Leg Wound Open Wounding Event: Blister Status: Date Acquired: 09/14/2021 Comorbid Arrhythmia, Congestive Heart Failure, Coronary Artery Disease, Weeks Of Treatment: 1 History: Hypertension, Peripheral Venous Disease, Osteoarthritis, Seizure Clustered Wound: Yes Disorder Photos Wound Measurements Length: (cm) 10.2 Width: (cm) 30 Depth: (cm) 0.1 Clustered Quantity: 7 Area: (cm) 240.332 Volume: (cm) 24.033 % Reduction in Area: 8.9% % Reduction in Volume: 8.9% Epithelialization: None Tunneling: No Undermining: No Wound Description Classification: Full Thickness With Exposed Support Structures Wound Margin: Distinct, outline attached Exudate Amount: Medium Exudate Type: Serosanguineous Exudate Color: red, brown Foul Odor After Cleansing: No Slough/Fibrino Yes Wound Bed Granulation Amount: Medium (34-66%) Exposed Structure Granulation Quality: Red, Pink Fascia Exposed: No Necrotic Amount: Medium (34-66%) Fat Layer (Subcutaneous Tissue) Exposed: No Necrotic Quality: Adherent Slough Tendon Exposed: No Muscle Exposed: No Joint Exposed: No Bone Exposed: No Electronic Signature(s) Signed: 10/23/2021 5:07:52 PM By: Rhae Hammock RN Entered By: Rhae Hammock on 10/23/2021 10:34:47 -------------------------------------------------------------------------------- Wound Assessment Details Patient Name: Date of Service: [name] [name]. 10/23/2021 10:00 A M Medical Record Number: [id number] Patient Account Number: [id number] Date of Birth/Sex: Treating RN: 1924/12/03 (88 y.o. [name] [name] Primary Care Aayliah Rotenberry: [name] Other  Clinician: Referring Kynsleigh Westendorf: Treating Mariah Harn/Extender: Patricia Nettle, Ihor Gully in Treatment: 1 Wound Status Wound Number: 3 Primary Venous Leg Ulcer Etiology: Wound Location: Right, Circumferential Lower Leg Wound Open Wounding Event: Blister Status: Date Acquired: 09/14/2021 Comorbid  Arrhythmia, Congestive Heart Failure, Coronary Artery Disease, Weeks Of Treatment: 1 History: Hypertension, Peripheral Venous Disease, Osteoarthritis, Seizure Clustered Wound: Yes Disorder Photos Wound Measurements Length: (cm) 21.4 Width: (cm) 26 Depth: (cm) 0.1 Clustered Quantity: 7 Area: (cm) 436.996 Volume: (cm) 43.7 % Reduction in Area: -5.4% % Reduction in Volume: -5.4% Epithelialization: None Tunneling: No Undermining: No Wound Description Classification: Full Thickness With Exposed Support Structures Wound Margin: Distinct, outline attached Exudate Amount: Medium Exudate Type: Serosanguineous Exudate Color: red, brown Foul Odor After Cleansing: No Slough/Fibrino Yes Wound Bed Granulation Amount: Medium (34-66%) Exposed Structure Granulation Quality: Red, Pink Fascia Exposed: No Necrotic Amount: Medium (34-66%) Fat Layer (Subcutaneous Tissue) Exposed: No Necrotic Quality: Adherent Slough Tendon Exposed: No Muscle Exposed: No Joint Exposed: No Bone Exposed: No Electronic Signature(s) Signed: 10/23/2021 5:07:52 PM By: Rhae Hammock RN Entered By: Rhae Hammock on 10/23/2021 10:28:16 -------------------------------------------------------------------------------- Vitals Details Patient Name: Date of Service: [name] [name]. 10/23/2021 10:00 A M Medical Record Number: [id number] Patient Account Number: [id number] Date of Birth/Sex: Treating RN: [birth year] (88 y.o. [name] [name] Primary Care Shomari Matusik: [name] Other Clinician: Referring Carlen Rebuck: Treating Rayon Mcchristian/Extender: Patricia Nettle, Ihor Gully in Treatment: 1 Vital Signs Time Taken: 10:09 Temperature (F): 98.2 Pulse (bpm): 71 Respiratory Rate (breaths/min): 16 Blood Pressure (mmHg): 161/66 Reference Range: 80 - 120 mg / dl Electronic Signature(s) Signed: 11/26/2021 8:46:54 AM By: Erenest Blank Entered By: Erenest Blank on 10/23/2021 10:09:39

## 2021-11-26 NOTE — Progress Notes (Signed)
[name] [name], [name] [name] ([id number]) Visit Report for 10/16/2021 Allergy List Details Patient Name: Date of Service: [name] [name], [name] [name] 10/16/2021 8:30 A M Medical Record Number: [id number] Patient Account Number: [id number] Date of Birth/Sex: Treating RN: 1924/11/17 (88 y.o. [name], [name] Primary Care Ladon Heney: [name] Other Clinician: Referring Alanis Clift: Treating Aariyana Manz/Extender: Patricia Nettle, Meredith Staggers Weeks in Treatment: 0 Allergies Active Allergies No Known Allergies Allergy Notes Electronic Signature(s) Signed: 10/17/2021 4:12:14 PM By: Rhae Hammock RN Entered By: Rhae Hammock on 10/16/2021 08:19:51 -------------------------------------------------------------------------------- Arrival Information Details Patient Name: Date of Service: [name] [name] 10/16/2021 8:30 A M Medical Record Number: [id number] Patient Account Number: [id number] Date of Birth/Sex: Treating RN: [birth year] (88 y.o. [name], [name] Primary Care Baran Kuhrt: [name] Other Clinician: Referring Tonika Eden: Treating Aldrick Derrig/Extender: [name] in Treatment: 0 Visit Information Patient Arrived: Wheel Chair Arrival Time: 08:30 Accompanied By: self Transfer Assistance: Manual Patient Identification Verified: Yes Secondary Verification Process Completed: Yes Patient Requires Transmission-Based Precautions: No Patient Has Alerts: Yes Patient Alerts: ABI's 05/23 R: N/C History Since Last Visit Electronic Signature(s) Signed: 10/17/2021 4:12:14 PM By: Rhae Hammock RN Entered By: Rhae Hammock on 10/16/2021 09:24:25 -------------------------------------------------------------------------------- Clinic Level of Care Assessment Details Patient Name: Date of Service: [name] [name], [name] [name] 10/16/2021 8:30 A M Medical Record Number: [id number] Patient Account Number: [id number] Date of Birth/Sex: Treating RN: 26-May-1925 (88 y.o. [name], [name] Primary Care Sonoma Firkus: [name] Other Clinician: Referring Raksha Wolfgang: Treating Dondi Aime/Extender: [name] in Treatment: 0 Clinic Level of Care Assessment Items TOOL 3 Quantity Score X- 1 0 Use when EandM and Procedure is performed on FOLLOW-UP visit ASSESSMENTS - Nursing Assessment / Reassessment X- 1 10 Reassessment of Co-morbidities (includes updates in patient status) X- 1 5 Reassessment of Adherence to Treatment Plan ASSESSMENTS - Wound and Skin Assessment / Reassessment '[]'$  - Points for Wound Assessment can only be taken for a new wound of unknown or different etiology and a procedure is 0 NOT performed to that wound '[]'$  - 0 Simple Wound Assessment / Reassessment - one wound X- 2 5 Complex Wound Assessment / Reassessment - multiple wounds '[]'$  - 0 Dermatologic / Skin Assessment (not related to wound area) ASSESSMENTS - Focused Assessment X- 2 5 Circumferential Edema Measurements - multi extremities '[]'$  - 0 Nutritional Assessment / Counseling / Intervention '[]'$  - 0 Lower Extremity Assessment (monofilament, tuning fork, pulses) '[]'$  - 0 Peripheral Arterial Disease Assessment (using hand held doppler) ASSESSMENTS - Ostomy and/or Continence Assessment and Care '[]'$  - 0 Incontinence Assessment and Management '[]'$  - 0 Ostomy Care Assessment and Management (repouching, etc.) PROCESS - Coordination of Care '[]'$  - Points for Discharge Coordination can only be taken for a new wound of unknown or different etiology and a 0 procedure is NOT performed to that wound '[]'$  - 0 Simple Patient / Family Education for ongoing care X- 1 20 Complex (extensive) Patient / Family Education for ongoing care X- 1 10 Staff obtains Programmer, systems, Records, T Results / Process Orders est X- 1 10 Staff telephones HHA, Nursing Homes / Clarify orders / etc '[]'$  - 0 Routine Transfer to another Facility (non-emergent condition) '[]'$  - 0 Routine Hospital Admission  (non-emergent condition) X- 1 15 New Admissions / Biomedical engineer / Ordering NPWT Apligraf, etc. , '[]'$  - 0 Emergency Hospital Admission (emergent condition) '[]'$  - 0 Simple Discharge Coordination X- 1 15 Complex (extensive) Discharge Coordination PROCESS - Special Needs '[]'$  - 0 Pediatric / Minor Patient  Management '[]'$  - 0 Isolation Patient Management '[]'$  - 0 Hearing / Language / Visual special needs '[]'$  - 0 Assessment of Community assistance (transportation, D/C planning, etc.) '[]'$  - 0 Additional assistance / Altered mentation '[]'$  - 0 Support Surface(s) Assessment (bed, cushion, seat, etc.) INTERVENTIONS - Wound Cleansing / Measurement '[]'$  - Points for Wound Cleaning / Measurement, Wound Dressing, Specimen Collection and Specimen taken to lab can 0 only be taken for a new wound of unknown or different etiology and a procedure is NOT performed to that wound '[]'$  - 0 Simple Wound Cleansing - one wound X- 2 5 Complex Wound Cleansing - multiple wounds X- 1 5 Wound Imaging (photographs - any number of wounds) '[]'$  - 0 Wound Tracing (instead of photographs) '[]'$  - 0 Simple Wound Measurement - one wound X- 2 5 Complex Wound Measurement - multiple wounds INTERVENTIONS - Wound Dressings '[]'$  - 0 Small Wound Dressing one or multiple wounds X- 2 15 Medium Wound Dressing one or multiple wounds '[]'$  - 0 Large Wound Dressing one or multiple wounds INTERVENTIONS - Miscellaneous '[]'$  - 0 External ear exam '[]'$  - 0 Specimen Collection (cultures, biopsies, blood, body fluids, etc.) '[]'$  - 0 Specimen(s) / Culture(s) sent or taken to Lab for analysis '[]'$  - 0 Patient Transfer (multiple staff / Civil Service fast streamer / Similar devices) '[]'$  - 0 Simple Staple / Suture removal (25 or less) '[]'$  - 0 Complex Staple / Suture removal (26 or more) '[]'$  - 0 Hypo / Hyperglycemic Management (close monitor of Blood Glucose) X- 1 15 Ankle / Brachial Index (ABI) - do not check if billed separately X- 1 5 Vital Signs Has  the patient been seen at the hospital within the last three years: Yes Total Score: 180 Level Of Care: New/Established - Level 5 Electronic Signature(s) Signed: 10/17/2021 4:12:14 PM By: Rhae Hammock RN Entered By: Rhae Hammock on 10/16/2021 10:19:05 -------------------------------------------------------------------------------- Compression Therapy Details Patient Name: Date of Service: [name] [name]. 10/16/2021 8:30 A M Medical Record Number: [id number] Patient Account Number: [id number] Date of Birth/Sex: Treating RN: 07/05/1924 (88 y.o. [name], [name] Primary Care Allsion [place]: [name] Other Clinician: Referring Egidio Lofgren: Treating Bejamin Hackbart/Extender: [name] in Treatment: 0 Compression Therapy Performed for Wound Assessment: Wound #2 Left,Circumferential Lower Leg Performed By: Clinician Rhae Hammock, RN Compression Type: Three Layer Post Procedure Diagnosis Same as Pre-procedure Electronic Signature(s) Signed: 10/17/2021 4:12:14 PM By: Rhae Hammock RN Entered By: Rhae Hammock on 10/16/2021 10:11:10 -------------------------------------------------------------------------------- Compression Therapy Details Patient Name: Date of Service: [name] [name], [name] [name] 10/16/2021 8:30 A M Medical Record Number: [id number] Patient Account Number: [id number] Date of Birth/Sex: Treating RN: 10-29-1924 (88 y.o. [name], [name] Primary Care Smiley Birr: [name] Other Clinician: Referring Edeline Greening: Treating Hiroki Wint/Extender: [name] in Treatment: 0 Compression Therapy Performed for Wound Assessment: Wound #3 Right,Circumferential Lower Leg Performed By: Clinician Rhae Hammock, RN Compression Type: Three Layer Post Procedure Diagnosis Same as Pre-procedure Electronic Signature(s) Signed: 10/17/2021 4:12:14 PM By: Rhae Hammock RN Entered By: Rhae Hammock on 10/16/2021  10:11:10 -------------------------------------------------------------------------------- Encounter Discharge Information Details Patient Name: Date of Service: [name] [name]. 10/16/2021 8:30 A M Medical Record Number: [id number] Patient Account Number: [id number] Date of Birth/Sex: Treating RN: 1925/06/14 (88 y.o. [name], [name] Primary Care Jakavion Bilodeau: [name] Other Clinician: Referring Niraj Kudrna: Treating Mercedies Ganesh/Extender: [name] in Treatment: 0 Encounter Discharge Information Items Discharge Condition: Stable Ambulatory Status: Wheelchair Discharge Destination: Skilled Nursing Facility Telephoned: No Orders Sent:  Yes Transportation: Private Auto Accompanied By: SELf Schedule Follow-up Appointment: Yes Clinical Summary of Care: Patient Declined Electronic Signature(s) Signed: 10/17/2021 4:12:14 PM By: Rhae Hammock RN Entered By: Rhae Hammock on 10/16/2021 10:20:32 -------------------------------------------------------------------------------- Lower Extremity Assessment Details Patient Name: Date of Service: [name] [name], [name] [name] 10/16/2021 8:30 A M Medical Record Number: [id number] Patient Account Number: [id number] Date of Birth/Sex: Treating RN: [birth year] (88 y.o. [name], [name] Primary Care Jerzi Tigert: [name] Other Clinician: Referring Trinitie Mcgirr: Treating Kweku Stankey/Extender: Patricia Nettle, Meredith Staggers Weeks in Treatment: 0 Edema Assessment Assessed: Shirlyn Goltz: Yes] Patrice Paradise: Yes] Edema: [Left: Yes] [Right: Yes] Calf Left: Right: Point of Measurement: 34 cm From Medial Instep 38.5 cm 38 cm Ankle Left: Right: Point of Measurement: 9 cm From Medial Instep 24.5 cm 24 cm Knee To Floor Left: Right: From Medial Instep 42 cm Vascular Assessment Pulses: Dorsalis Pedis Palpable: [Left:Yes] [Right:Yes] Posterior Tibial Palpable: [Left:Yes] [Right:Yes] Blood Pressure: Brachial:  [Left:147] Ankle: [Left:Dorsalis Pedis: 120 0.82] Notes RLE: [place] Electronic Signature(s) Signed: 10/17/2021 4:12:14 PM By: Rhae Hammock RN Entered By: Rhae Hammock on 10/16/2021 09:24:44 -------------------------------------------------------------------------------- [place] Details Patient Name: Date of Service: [name] [name]. 10/16/2021 8:30 A M Medical Record Number: [id number] Patient Account Number: [id number] Date of Birth/Sex: Treating RN: September 05, 1924 (88 y.o. [name], [name] Primary Care Kailee Essman: [name] Other Clinician: Referring Lakara Weiland: Treating Majour Frei/Extender: Patricia Nettle, Ihor Gully in Treatment: 0 Active Inactive Orientation to the Wound Care Program Nursing Diagnoses: Knowledge deficit related to the wound healing center program Goals: Patient/caregiver will verbalize understanding of the [place] Date Initiated: 10/16/2021 Target Resolution Date: 11/20/2021 Goal Status: Active Interventions: Provide education on orientation to the wound center Notes: Wound/Skin Impairment Nursing Diagnoses: Impaired tissue integrity Knowledge deficit related to ulceration/compromised skin integrity Goals: Patient will have a decrease in wound volume by X% from date: (specify in notes) Date Initiated: 10/16/2021 Target Resolution Date: 11/21/2021 Goal Status: Active Patient/caregiver will verbalize understanding of skin care regimen Date Initiated: 10/16/2021 Target Resolution Date: 11/21/2021 Goal Status: Active Ulcer/skin breakdown will have a volume reduction of 30% by week 4 Date Initiated: 10/16/2021 Target Resolution Date: 11/21/2021 Goal Status: Active Interventions: Assess patient/caregiver ability to obtain necessary supplies Assess patient/caregiver ability to perform ulcer/skin care regimen upon admission and as needed Assess ulceration(s) every visit Notes: Electronic  Signature(s) Signed: 10/17/2021 4:12:14 PM By: Rhae Hammock RN Entered By: Rhae Hammock on 10/16/2021 08:59:23 -------------------------------------------------------------------------------- Pain Assessment Details Patient Name: Date of Service: [name] [name]. 10/16/2021 8:30 A M Medical Record Number: [id number] Patient Account Number: [id number] Date of Birth/Sex: Treating RN: 1924-06-21 (88 y.o. [name], [name] Primary Care Ellarose Brandi: [name] Other Clinician: Referring Natina Wiginton: Treating Eman Rynders/Extender: Patricia Nettle, Ihor Gully in Treatment: 0 Active Problems Location of Pain Severity and Description of Pain Patient Has Paino No Site Locations Pain Management and Medication Current Pain Management: Electronic Signature(s) Signed: 10/17/2021 4:12:14 PM By: Rhae Hammock RN Entered By: Rhae Hammock on 10/16/2021 08:48:19 -------------------------------------------------------------------------------- Patient/Caregiver Education Details Patient Name: Date of Service: [name] [name] 5/3/2023andnbsp8:30 A M Medical Record Number: [id number] Patient Account Number: [id number] Date of Birth/Gender: Treating RN: Jan 13, 1925 (88 y.o. Erie Noe Primary Care Physician: [name] Other Clinician: Referring Physician: Treating Physician/Extender: [name] in Treatment: 0 Education Assessment Education Provided To: Patient Education Topics Provided Welcome T [facility] [place]: o Methods: Explain/Verbal Responses: Reinforcements needed, State content correctly Electronic Signature(s) Signed: 10/17/2021 4:12:14 PM By: Hollie Salk,  [name] RN Entered By: Rhae Hammock on 10/16/2021 08:59:32 -------------------------------------------------------------------------------- Wound Assessment Details Patient Name: Date of Service: [name] [name], [name] [name] 10/16/2021 8:30 A M Medical Record  Number: [id number] Patient Account Number: [id number] Date of Birth/Sex: Treating RN: Nov 09, 1924 (88 y.o. [name], [name] Primary Care Carmine Youngberg: [name] Other Clinician: Referring Jonice Cerra: Treating Cash Meadow/Extender: Patricia Nettle, Ihor Gully in Treatment: 0 Wound Status Wound Number: 2 Primary Venous Leg Ulcer Etiology: Wound Location: Left, Circumferential Lower Leg Wound Open Wounding Event: Gradually Appeared Status: Date Acquired: 09/14/2021 Comorbid Arrhythmia, Congestive Heart Failure, Coronary Artery Disease, Weeks Of Treatment: 0 History: Hypertension, Peripheral Venous Disease, Osteoarthritis, Seizure Clustered Wound: Yes Disorder Wound Measurements Length: (cm) 14 Width: (cm) 24 Depth: (cm) 0.1 Clustered Quantity: 7 Area: (cm) 263.894 Volume: (cm) 26.389 % Reduction in Area: % Reduction in Volume: Epithelialization: None Tunneling: No Undermining: No Wound Description Classification: Full Thickness With Exposed Support Structures Wound Margin: Distinct, outline attached Exudate Amount: Medium Exudate Type: Serosanguineous Exudate Color: red, brown Wound Bed Granulation Amount: Medium (34-66%) Granulation Quality: Red, Pink Necrotic Amount: Medium (34-66%) Necrotic Quality: Adherent Slough Foul Odor After Cleansing: No Slough/Fibrino Yes Exposed Structure Fascia Exposed: No Fat Layer (Subcutaneous Tissue) Exposed: No Tendon Exposed: No Muscle Exposed: No Joint Exposed: No Bone Exposed: No Electronic Signature(s) Signed: 10/17/2021 4:12:14 PM By: Rhae Hammock RN Entered By: Rhae Hammock on 10/16/2021 08:57:31 -------------------------------------------------------------------------------- Wound Assessment Details Patient Name: Date of Service: [name] [name]. 10/16/2021 8:30 A M Medical Record Number: [id number] Patient Account Number: [id number] Date of Birth/Sex: Treating RN: 09/09/1924 (88 y.o. [name],  [name] Primary Care Neilan Rizzo: [name] Other Clinician: Referring Jilene Spohr: Treating Eliel Dudding/Extender: Patricia Nettle, Ihor Gully in Treatment: 0 Wound Status Wound Number: 3 Primary Venous Leg Ulcer Etiology: Wound Location: Right, Circumferential Lower Leg Wound Open Wounding Event: Blister Status: Date Acquired: 09/14/2021 Comorbid Arrhythmia, Congestive Heart Failure, Coronary Artery Disease, Weeks Of Treatment: 0 History: Hypertension, Peripheral Venous Disease, Osteoarthritis, Seizure Clustered Wound: Yes Disorder Wound Measurements Length: (cm) 22 Width: (cm) 24 Depth: (cm) 0.1 Clustered Quantity: 7 Area: (cm) 414.69 Volume: (cm) 41.469 % Reduction in Area: % Reduction in Volume: Epithelialization: None Tunneling: No Undermining: No Wound Description Classification: Full Thickness With Exposed Support Structures Wound Margin: Distinct, outline attached Exudate Amount: Medium Exudate Type: Serosanguineous Exudate Color: red, brown Foul Odor After Cleansing: No Slough/Fibrino Yes Wound Bed Granulation Amount: Medium (34-66%) Exposed Structure Granulation Quality: Red, Pink Fascia Exposed: No Necrotic Amount: Medium (34-66%) Fat Layer (Subcutaneous Tissue) Exposed: No Necrotic Quality: Adherent Slough Tendon Exposed: No Muscle Exposed: No Joint Exposed: No Bone Exposed: No Electronic Signature(s) Signed: 10/17/2021 4:12:14 PM By: Rhae Hammock RN Entered By: Rhae Hammock on 10/16/2021 08:58:30 -------------------------------------------------------------------------------- Vitals Details Patient Name: Date of Service: [name] [name]. 10/16/2021 8:30 A M Medical Record Number: [id number] Patient Account Number: [id number] Date of Birth/Sex: Treating RN: 1925/01/03 (88 y.o. [name], [name] Primary Care Apple Dearmas: [name] Other Clinician: Referring Jhalil Silvera: Treating Jerney Baksh/Extender: Patricia Nettle,  Ihor Gully in Treatment: 0 Vital Signs Time Taken: 08:32 Temperature (F): 97.9 Pulse (bpm): 65 Respiratory Rate (breaths/min): 16 Blood Pressure (mmHg): 147/66 Reference Range: 80 - 120 mg / dl Electronic Signature(s) Signed: 11/26/2021 8:46:54 AM By: Erenest Blank Entered By: Erenest Blank on 10/16/2021 08:32:41

## 2021-11-27 NOTE — Progress Notes (Incomplete)
Location:  [facility] Room Number: 76 A Place of Service:  SNF (31) Provider:  Durenda Age, DNP, FNP-BC  Patient Care Team: [name], MD as PCP - General (Internal Medicine) Gatha Mayer, MD as Consulting Physician (Gastroenterology) [name], [name], MD as Consulting Physician (Cardiology) [name], MD as Attending Physician (Internal Medicine) Mast, Man X, NP as Nurse Practitioner (Internal Medicine)  Extended Emergency Contact Information Primary Emergency Contact: Koogler,[name] Address: [place], [place] [address] [name] of [place] Phone: [phone] Mobile Phone: [phone] Relation: Daughter Secondary Emergency Contact: Bertran,[name] Address: [place] [address] [name] of [place] Phone: [phone] Mobile Phone: [phone] Relation: Son  Code Status:  DNR  Goals of care: Advanced Directive information    10/28/2021    3:37 PM  Advanced Directives  Does Patient Have a Medical Advance Directive? Yes  Type of Paramedic of [place];Living will;Out of facility DNR (pink MOST or yellow form)  Does patient want to make changes to medical advance directive? No - Patient declined  Copy of [place] in Chart? Yes - validated most recent copy scanned in chart (See row information)     Chief Complaint  Patient presents with  . Medical Management of Chronic Issues    Routine Visit    HPI:  Pt is a 88 y.o. male seen today for medical management of chronic diseases. She is a long-term care resident of [facility].   Past Medical History:  Diagnosis Date  . Allergic rhinitis   . Amnesia   . Atherosclerotic heart disease of native coronary artery without angina pectoris   . BPH (benign prostatic hyperplasia)   . Bradycardia   . Chronic diastolic (congestive) heart failure  ([place])   . Colon polyps   . Coronary artery disease    MODERATE  . Dizziness   . Edema of lower extremity   . Erectile dysfunction   . H. pylori infection    Hx of   . Hard of hearing   . Headache(784.0)   . History of colon polyps 2009   Dr. Orr/Colonoscopy -small cecal adenoma  . History of falling   . Hx of colonoscopy 2009   Dr Lajoyce Corners, hemorrhoids & polyps  . Hyperlipidemia   . Hypertension   . Hypertensive heart disease with heart failure ([place])   . Hypokalemia   . Internal hemorrhoids 2009   Dr. Lajoyce Corners Colonoscopy  . Metabolic encephalopathy   . Mitral valve regurgitation   . Muscle weakness (generalized)   . Pneumonia   . Primary generalized (osteo)arthritis   . Reflux esophagitis   . Sepsis ([place]) 11/04/2018  . Unspecified hemorrhoids   . Unsteadiness on feet   . Vitamin D deficiency    Past Surgical History:  Procedure Laterality Date  . APPENDECTOMY    . CARDIAC CATHETERIZATION  1997   REVEALED MILD TO MODERATE IRREGULARITIES. HIS LEFT EVNTRICULAR SYSTOLIC FUNCTION REVEALED NORMAL EJECTION FRACTION WITH EF OF 70%  . CATARACT EXTRACTION, BILATERAL    . COLONOSCOPY  multiple  . fatty tumor removal     benign  . HEMORROIDECTOMY    . HIP ARTHROPLASTY Left 08/17/2013   Procedure: ARTHROPLASTY BIPOLAR HIP;  Surgeon: Gearlean Alf, MD;  Location: [facility];  Service: Orthopedics;  Laterality: Left;  . INGUINAL  HERNIA REPAIR Bilateral 2006  . lower back surgery  2006  . NASAL SEPTUM SURGERY    . TONSILLECTOMY    . UPPER GASTROINTESTINAL ENDOSCOPY      No Known Allergies  Outpatient Encounter Medications as of 11/20/2021  Medication Sig  . acetaminophen (TYLENOL) 500 MG tablet Take 500 mg by mouth every 8 (eight) hours as needed.  Marland Kitchen amLODipine (NORVASC) 5 MG tablet Take 5 mg by mouth daily.  Marland Kitchen aspirin 325 MG tablet Take 325 mg by mouth daily.  Marland Kitchen DEXTRAN 70-HYPROMELLOSE OP Apply 1 drop to eye 3 (three) times daily as needed. Special Instructions: as needed for dry  eyes/itching  . Emollient (CERAVE) CREA Apply topically at bedtime. To lower extremities @ bedtime  . hydrocortisone (ANUSOL-HC) 2.5 % rectal cream Place 1 application rectally 2 (two) times daily as needed for anal itching or hemorrhoids (USE PERINEAL APPLICATOR). With perineal applicator  . levETIRAcetam (KEPPRA) 500 MG tablet Take 1 tablet (500 mg total) by mouth 2 (two) times daily.  Marland Kitchen levofloxacin (LEVAQUIN) 500 MG tablet Take 500 mg by mouth daily.  Marland Kitchen lisinopril (ZESTRIL) 40 MG tablet Take 40 mg by mouth daily.  . Multiple Vitamins-Minerals (THERA-M) TABS Take 1 tablet by mouth daily.  Marland Kitchen omeprazole (PRILOSEC) 20 MG capsule Take 20 mg by mouth daily.  . potassium chloride SA (KLOR-CON) 20 MEQ tablet Take 20 mEq by mouth daily.  . rosuvastatin (CRESTOR) 5 MG tablet Take 5 mg by mouth daily.   Marland Kitchen saccharomyces boulardii (FLORASTOR) 250 MG capsule Take 250 mg by mouth 2 (two) times daily.  Marland Kitchen torsemide (DEMADEX) 20 MG tablet Take 20 mg by mouth daily.  . vitamin B-12 (CYANOCOBALAMIN) 1000 MCG tablet Take 1,000 mcg by mouth daily.   No facility-administered encounter medications on file as of 11/20/2021.    Review of Systems ***    Immunization History  Administered Date(s) Administered  . Influenza, High Dose Seasonal PF 03/19/2019, 03/19/2019  . Influenza-Unspecified 04/08/2016, 03/27/2020, 04/03/2021  . Moderna Sars-Covid-2 Vaccination 06/18/2019, 07/16/2019, 04/24/2020, 11/13/2020  . Pension scheme manager 40yr & up 03/05/2021  . Pneumococcal Conjugate-13 06/02/2013  . Pneumococcal Polysaccharide-23 04/07/2001  . Tetanus 08/14/2013  . Zoster Recombinat (Shingrix) 08/03/2021   Pertinent  Health Maintenance Due  Topic Date Due  . INFLUENZA VACCINE  01/14/2022      09/21/2019    8:15 PM 09/22/2019    7:30 AM 09/22/2019    9:00 PM 09/23/2019    9:30 AM 01/12/2020    3:02 AM  Fall Risk  Patient Fall Risk Level High fall risk High fall risk High fall risk High fall  risk High fall risk     Vitals:   11/20/21 1702  BP: 130/66  Pulse: 70  Resp: 20  Temp: (!) 97 F (36.1 C)  SpO2: 96%  Weight: 176 lb 14.4 oz (80.2 kg)  Height: '5\' 7"'$  (1.702 m)   Body mass index is 27.71 kg/m.  Physical Exam     Labs reviewed: Recent Labs    02/05/21 0000 07/18/21 0000 10/15/21 0000  NA 137 141 143  K 4.0 4.3 5.1  CL 102 102 106  CO2 28* 31* 24*  BUN 14 16 35*  CREATININE 0.8 1.0 1.3  CALCIUM 8.7 9.3 9.6   Recent Labs    07/18/21 0000  AST 19  ALT 17  ALKPHOS 61  ALBUMIN 3.9   Recent Labs    07/18/21 0000  WBC 7.4  HGB 14.1  HCT  41  PLT 223   Lab Results  Component Value Date   TSH 1.77 04/06/2020   No results found for: HGBA1C Lab Results  Component Value Date   CHOL 131 07/18/2021   HDL 57 07/18/2021   LDLCALC 61 07/18/2021   TRIG 57 07/18/2021   CHOLHDL 2.7 02/17/2017    Significant Diagnostic Results in last 30 days:  No results found.  Assessment/Plan ***   Family/ staff Communication: Discussed plan of care with resident and charge nurse  Labs/tests ordered:     Durenda Age, DNP, MSN, FNP-BC [facility] [phone] (Monday-Friday 8:00 a.m. - 5:00 p.m.) [phone] (after hours)

## 2021-12-04 ENCOUNTER — Encounter (HOSPITAL_BASED_OUTPATIENT_CLINIC_OR_DEPARTMENT_OTHER): Payer: Medicare HMO | Admitting: Physician Assistant

## 2021-12-04 DIAGNOSIS — I110 Hypertensive heart disease with heart failure: Secondary | ICD-10-CM | POA: Diagnosis not present

## 2021-12-04 DIAGNOSIS — I87333 Chronic venous hypertension (idiopathic) with ulcer and inflammation of bilateral lower extremity: Secondary | ICD-10-CM | POA: Diagnosis not present

## 2021-12-04 DIAGNOSIS — L97318 Non-pressure chronic ulcer of right ankle with other specified severity: Secondary | ICD-10-CM | POA: Diagnosis not present

## 2021-12-04 DIAGNOSIS — I5042 Chronic combined systolic (congestive) and diastolic (congestive) heart failure: Secondary | ICD-10-CM | POA: Diagnosis not present

## 2021-12-04 DIAGNOSIS — L97822 Non-pressure chronic ulcer of other part of left lower leg with fat layer exposed: Secondary | ICD-10-CM | POA: Diagnosis not present

## 2021-12-04 DIAGNOSIS — L97812 Non-pressure chronic ulcer of other part of right lower leg with fat layer exposed: Secondary | ICD-10-CM | POA: Diagnosis not present

## 2021-12-04 NOTE — Progress Notes (Addendum)
[name] [name], [name] [name] ([id number]) Visit Report for 12/04/2021 Chief Complaint Document Details Patient Name: Date of Service: [name] [name] 12/04/2021 9:15 A M Medical Record Number: [id number] Patient Account Number: [id number] Date of Birth/Sex: Treating RN: 13-May-1925 (88 y.o. [name], [name] Primary Care Provider: [name] Other Clinician: Referring Provider: Treating Provider/Extender: [name] in Treatment: 7 Information Obtained from: Patient Chief Complaint Bilateral LE Ulcers Electronic Signature(s) Signed: 12/04/2021 9:31:32 AM By: Worthy Keeler PA-C Entered By: Worthy Keeler on 12/04/2021 09:31:32 -------------------------------------------------------------------------------- HPI Details Patient Name: Date of Service: [name] [name] 12/04/2021 9:15 A M Medical Record Number: [id number] Patient Account Number: [id number] Date of Birth/Sex: Treating RN: 03-Mar-1925 (88 y.o. [name], [name] Primary Care Provider: [name] Other Clinician: Referring Provider: Treating Provider/Extender: [name] in Treatment: 7 History of Present Illness HPI Description: ADMISSION 04/02/2021 This is a pleasant 88 year old man sent to our clinic by Dr. Lyndel Safe from the skilled unit of [facility]. He has a wound on the right posterior calf. I am uncertain about the duration of this however it looks to be chronic. They are using Santyl covered with silver alginate he does not have any compression. The wound is 100% covered in very fibrinous adherent slough. It also appears that this is quite painful. The patient is exceptionally hard of hearing so it is difficult to get any additional history out of him. Past medical history includes hypertension, coronary artery disease, congestive heart failure, BPH, seizure disorder. He is nonambulatory for reasons that are not totally clear. He lives at the  skilled unit of [facility]. ABI in our clinic was 1.03 on the right 11/2; 2-week follow-up. He is having the dressing changed at the facility. He arrives in the clinic with the area about the same in terms of surface area but better looking surface we have been using Iodoflex under 3 layer compression. Unfortunately we still do not have any history of exactly how long this is been there or how it happened. 11/16; 2-week follow-up. Nice improvement in the wound. He is using Iodoflex over 3 layer compression. 11/30 2-week follow-up. He is at the [facility] units of [facility] [place]. We have been using Iodoflex under 3 layer compression. They are not wrapping his leg properly but nevertheless the wound area has come down nicely. The patient requests coming here every 3 weeks instead of every 2 12/21; the area on the posterior right calf is fully epithelialized. The patient has chronic venous insufficiency. Readmission: 10-16-2021 upon evaluation today patient appears to be doing somewhat poorly in regard to the wounds on the bilateral lower extremities. He has been tolerating the dressing changes without complication. Fortunately there does not appear to be any evidence of active infection locally or systemically which is great news. Overall I am extremely pleased with where things stand. With that being said that obviously he has quite a bit of swelling and this is led to several areas of significant blistering over his bilateral lower extremities all the wounds are superficial this is the good news. Patient continues to have issues with chronic venous hypertension bilaterally with ulcerations. He also has a history of hypertension and congestive heart failure. He previously tolerated 3 layer compression wraps without complication. He was last seen and discharged in our office June 05, 2021. 10-23-2021 upon evaluation today patient appears to be doing excellent in regard to his legs. They do  look better although  there is a lot of bright green drainage I do believe this likely represents a Pseudomonas infection and that was discussed with him as best I could today again he is extremely hard of hearing. I had to write most of what I wanted him to know down. 10-30-2021 upon evaluation 20 today patient appears to be doing much better in regard to his leg ulcers. He has been taking the Levaquin without complication and overall very pleased in that regard. Fortunately I do not see any evidence of infection locally or systemically which is great news. No fevers, chills, nausea, vomiting, or diarrhea. 11-06-2021 upon evaluation today patient appears to be doing excellent in regard to his wounds. In fact the left leg is almost completely healed the right leg is significantly smaller. Overall I do believe we are on the right track here. 6/7; 2-week follow-up. The patient's left leg is essentially closed. His superficial areas anteriorly look like they are doing very well with nice rims of epithelialization. UNFORTUNATELY he has a new wound on the right lateral malleolus the exact etiology of this is not really clear but he is mostly complaining of this wound. We have been using Xeroform under kerlix Coban The patient is in the [facility] home part of [facility]. I am uncertain whether he has 20/30 below-knee stockings or not but he is definitely going to need them. 12-04-2021 upon evaluation today patient appears to be doing better in regard to his wound especially on the left side of the lower extremity although the right side he has a new blister healing of the other wounds are very close to complete closure. This is due to it not being wrapped appropriately from the knee down to the base of the toes. This has been in the orders for the past several visits and to be honest I am not really sure why its not being done as ordered. Electronic Signature(s) Signed: 12/04/2021 9:58:36 AM By: Worthy Keeler PA-C Entered By: Worthy Keeler on 12/04/2021 09:58:36 -------------------------------------------------------------------------------- Physical Exam Details Patient Name: Date of Service: [name] [name], [name] [name] 12/04/2021 9:15 A M Medical Record Number: [id number] Patient Account Number: [id number] Date of Birth/Sex: Treating RN: [birth year] (88 y.o. [name], [name] Primary Care Provider: [name] Other Clinician: Referring Provider: Treating Provider/Extender: Patricia Nettle, Ihor Gully in Treatment: 7 Constitutional Well-nourished and well-hydrated in no acute distress. Respiratory [name] [name] breathing without difficulty. Psychiatric this patient is able to make decisions and demonstrates good insight into disease process. Alert and Oriented x 3. pleasant and cooperative. Notes Upon inspection patient's wound bed actually showed signs of good granulation and epithelization at this point. Fortunately I do not see any evidence of active infection locally or systemically which is great news and overall I am extremely pleased with where we stand at this time. No sharp debridement was necessary today. Electronic Signature(s) Signed: 12/04/2021 9:58:58 AM By: Worthy Keeler PA-C Entered By: Worthy Keeler on 12/04/2021 09:58:57 -------------------------------------------------------------------------------- Physician Orders Details Patient Name: Date of Service: [name] [name], [name] [name] 12/04/2021 9:15 A M Medical Record Number: [id number] Patient Account Number: [id number] Date of Birth/Sex: Treating RN: [birth year] (88 y.o. [name], [name] Primary Care Provider: [name] Other Clinician: Referring Provider: Treating Provider/Extender: [name] in Treatment: 7 Verbal / Phone Orders: No Diagnosis Coding Follow-up Appointments ppointment in 2 weeks. - 12/18/21 @ 0915 w/ Toney Rakes Room # 9 Return A Other: - ****PLEASE  BE SURE  TO ONLY USE KERLIX AND COBAN FOR COMPRESSION WRAPS TO BOTH LEGS and START WRAPS AT BOTTOM OF TOES AND GO UP TO JUST BELOW THE BEND OF THE KNEE**** Registered Nurse to change legs dressings every other day and PRN soiled dressings!!! Bathing/ Shower/ Hygiene May shower with protection but do not get wound dressing(s) wet. Edema Control - Lymphedema / SCD / Other Elevate legs to the level of the heart or above for 30 minutes daily and/or when sitting, a frequency of: Avoid standing for long periods of time. Compression stocking or Garment 20-30 mm/Hg pressure to: - PT will need 20 30 compression stockings for both legs once wounds are healed. . ***FACILITY TO ORDER*** Wound Treatment Wound #2 - Lower Leg Wound Laterality: Left, Circumferential Cleanser: Soap and Water Every Other Day/15 Days Discharge Instructions: May shower and wash wound with dial antibacterial soap and water prior to dressing change. Cleanser: Wound Cleanser Every Other Day/15 Days Discharge Instructions: Cleanse the wound with wound cleanser prior to applying a clean dressing using gauze sponges, not tissue or cotton balls. Peri-Wound Care: Zinc Oxide Ointment 30g tube Every Other Day/15 Days Discharge Instructions: Apply Zinc Oxide to periwound with each dressing change Peri-Wound Care: Sween Lotion (Moisturizing lotion) Every Other Day/15 Days Discharge Instructions: Apply moisturizing lotion as directed Prim Dressing: Xeroform Occlusive Gauze Dressing, 4x4 in Every Other Day/15 Days ary Discharge Instructions: Apply to wound bed as instructed Secondary Dressing: ABD Pad, 5x9 Every Other Day/15 Days Discharge Instructions: Apply over primary dressing as directed. Compression Wrap: Kerlix Roll 4.5x3.1 (in/yd) Every Other Day/15 Days Discharge Instructions: Apply Kerlix and Coban compression as directed. Compression Wrap: Coban Self-Adherent Wrap 4x5 (in/yd) Every Other Day/15 Days Discharge Instructions:  Apply over Kerlix as directed. Wound #3 - Lower Leg Wound Laterality: Right, Anterior Cleanser: Soap and Water Every Other Day/15 Days Discharge Instructions: May shower and wash wound with dial antibacterial soap and water prior to dressing change. Cleanser: Wound Cleanser Every Other Day/15 Days Discharge Instructions: Cleanse the wound with wound cleanser prior to applying a clean dressing using gauze sponges, not tissue or cotton balls. Peri-Wound Care: Zinc Oxide Ointment 30g tube Every Other Day/15 Days Discharge Instructions: Apply Zinc Oxide to periwound with each dressing change Peri-Wound Care: Sween Lotion (Moisturizing lotion) Every Other Day/15 Days Discharge Instructions: Apply moisturizing lotion as directed Prim Dressing: Xeroform Occlusive Gauze Dressing, 4x4 in Every Other Day/15 Days ary Discharge Instructions: Apply to wound bed as instructed Secondary Dressing: ABD Pad, 5x9 Every Other Day/15 Days Discharge Instructions: Apply over primary dressing as directed. Compression Wrap: Kerlix Roll 4.5x3.1 (in/yd) Every Other Day/15 Days Discharge Instructions: Apply Kerlix and Coban compression as directed. Compression Wrap: Coban Self-Adherent Wrap 4x5 (in/yd) Every Other Day/15 Days Discharge Instructions: Apply over Kerlix as directed. Wound #4 - Ankle Wound Laterality: Right, Lateral Cleanser: Soap and Water Every Other Day/15 Days Discharge Instructions: May shower and wash wound with dial antibacterial soap and water prior to dressing change. Cleanser: Wound Cleanser Every Other Day/15 Days Discharge Instructions: Cleanse the wound with wound cleanser prior to applying a clean dressing using gauze sponges, not tissue or cotton balls. Peri-Wound Care: Zinc Oxide Ointment 30g tube Every Other Day/15 Days Discharge Instructions: Apply Zinc Oxide to periwound with each dressing change Peri-Wound Care: Sween Lotion (Moisturizing lotion) Every Other Day/15 Days Discharge  Instructions: Apply moisturizing lotion as directed Prim Dressing: Xeroform Occlusive Gauze Dressing, 4x4 in Every Other Day/15 Days ary Discharge Instructions: Apply to wound bed as instructed Secondary  Dressing: ABD Pad, 5x9 Every Other Day/15 Days Discharge Instructions: Apply over primary dressing as directed. Compression Wrap: Kerlix Roll 4.5x3.1 (in/yd) Every Other Day/15 Days Discharge Instructions: Apply Kerlix and Coban compression as directed. Compression Wrap: Coban Self-Adherent Wrap 4x5 (in/yd) Every Other Day/15 Days Discharge Instructions: Apply over Kerlix as directed. Wound #5 - Lower Leg Wound Laterality: Right, Anterior, Proximal Cleanser: Soap and Water Every Other Day/15 Days Discharge Instructions: May shower and wash wound with dial antibacterial soap and water prior to dressing change. Cleanser: Wound Cleanser Every Other Day/15 Days Discharge Instructions: Cleanse the wound with wound cleanser prior to applying a clean dressing using gauze sponges, not tissue or cotton balls. Peri-Wound Care: Zinc Oxide Ointment 30g tube Every Other Day/15 Days Discharge Instructions: Apply Zinc Oxide to periwound with each dressing change Peri-Wound Care: Sween Lotion (Moisturizing lotion) Every Other Day/15 Days Discharge Instructions: Apply moisturizing lotion as directed Prim Dressing: Xeroform Occlusive Gauze Dressing, 4x4 in Every Other Day/15 Days ary Discharge Instructions: Apply to wound bed as instructed Secondary Dressing: ABD Pad, 5x9 Every Other Day/15 Days Discharge Instructions: Apply over primary dressing as directed. Compression Wrap: Kerlix Roll 4.5x3.1 (in/yd) Every Other Day/15 Days Discharge Instructions: Apply Kerlix and Coban compression as directed. Compression Wrap: Coban Self-Adherent Wrap 4x5 (in/yd) Every Other Day/15 Days Discharge Instructions: Apply over Kerlix as directed. Electronic Signature(s) Signed: 12/04/2021 4:06:51 PM By: Rhae Hammock  RN Signed: 12/04/2021 5:09:34 PM By: Worthy Keeler PA-C Entered By: Rhae Hammock on 12/04/2021 09:43:49 -------------------------------------------------------------------------------- Problem List Details Patient Name: Date of Service: [name] [name], [name] [name] 12/04/2021 9:15 A M Medical Record Number: [id number] Patient Account Number: [id number] Date of Birth/Sex: Treating RN: 08-Dec-1924 (88 y.o. 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 12/04/2021 9:15 A M Medical Record Number: [id number] Patient Account Number: [id number] Date of Birth/Sex: Treating RN: [birth year] (88 y.o. [name], [name] Primary Care Provider: [name] Other Clinician: Referring  Provider: Treating Provider/Extender: [name] in Treatment: 7 Subjective Chief Complaint Information obtained from Patient Bilateral LE Ulcers History of Present Illness (HPI) ADMISSION 04/02/2021 This is a pleasant 88 year old man sent to our clinic by Dr. Lyndel Safe from the skilled unit of [facility]. He has a wound on the right posterior calf. I am uncertain about the duration of this however it looks to be chronic. They are using Santyl covered with silver alginate he does not have any compression. The wound is 100% covered in very fibrinous adherent slough. It also appears that this is quite painful. The patient  is exceptionally hard of hearing so it is difficult to get any additional history out of him. Past medical history includes hypertension, coronary artery disease, congestive heart failure, BPH, seizure disorder. He is nonambulatory for reasons that are not totally clear. He lives at the skilled unit of [facility]. ABI in our clinic was 1.03 on the right 11/2; 2-week follow-up. He is having the dressing changed at the facility. He arrives in the clinic with the area about the same in terms of surface area but better looking surface we have been using Iodoflex under 3 layer compression. Unfortunately we still do not have any history of exactly how long this is been there or how it happened. 11/16; 2-week follow-up. Nice improvement in the wound. He is using Iodoflex over 3 layer compression. 11/30 2-week follow-up. He is at the [facility] units of [facility] [place]. We have been using Iodoflex under 3 layer compression. They are not wrapping his leg properly but nevertheless the wound area has come down nicely. The patient requests coming here every 3 weeks instead of every 2 12/21; the area on the posterior right calf is fully epithelialized. The patient has chronic venous insufficiency. Readmission: 10-16-2021 upon evaluation today  patient appears to be doing somewhat poorly in regard to the wounds on the bilateral lower extremities. He has been tolerating the dressing changes without complication. Fortunately there does not appear to be any evidence of active infection locally or systemically which is great news. Overall I am extremely pleased with where things stand. With that being said that obviously he has quite a bit of swelling and this is led to several areas of significant blistering over his bilateral lower extremities all the wounds are superficial this is the good news. Patient continues to have issues with chronic venous hypertension bilaterally with ulcerations. He also has a history of hypertension and congestive heart failure. He previously tolerated 3 layer compression wraps without complication. He was last seen and discharged in our office June 05, 2021. 10-23-2021 upon evaluation today patient appears to be doing excellent in regard to his legs. They do look better although there is a lot of bright green drainage I do believe this likely represents a Pseudomonas infection and that was discussed with him as best I could today again he is extremely hard of hearing. I had to write most of what I wanted him to know down. 10-30-2021 upon evaluation 20 today patient appears to be doing much better in regard to his leg ulcers. He has been taking the Levaquin without complication and overall very pleased in that regard. Fortunately I do not see any evidence of infection locally or systemically which is great news. No fevers, chills, nausea, vomiting, or diarrhea. 11-06-2021 upon evaluation today patient appears to be doing excellent in regard to his wounds. In fact the left leg is almost completely healed the right leg is significantly smaller. Overall I do believe we are on the right track here. 6/7; 2-week follow-up. The patient's left leg is essentially closed. His superficial areas anteriorly look like they are  doing very well with nice rims of epithelialization. UNFORTUNATELY he has a new wound on the right lateral malleolus the exact etiology of this is not really clear but he is mostly complaining of this wound. We have been using Xeroform under kerlix Coban The patient is in the [facility] part [facility]. I am uncertain whether he has 20/30 below-knee stockings or not but he is  definitely going to need them. 12-04-2021 upon evaluation today patient appears to be doing better in regard to his wound especially on the left side of the lower extremity although the right side he has a new blister healing of the other wounds are very close to complete closure. This is due to it not being wrapped appropriately from the knee down to the base of the toes. This has been in the orders for the past several visits and to be honest I am not really sure why its not being done as ordered. Objective Constitutional Well-nourished and well-hydrated in no acute distress. Vitals Time Taken: 9:19 AM, Temperature: 98.2 F, Pulse: 64 bpm, Respiratory Rate: 17 breaths/min, Blood Pressure: 131/85 mmHg. Respiratory [name] [name] breathing without difficulty. Psychiatric this patient is able to make decisions and demonstrates good insight into disease process. Alert and Oriented x 3. pleasant and cooperative. General Notes: Upon inspection patient's wound bed actually showed signs of good granulation and epithelization at this point. Fortunately I do not see any evidence of active infection locally or systemically which is great news and overall I am extremely pleased with where we stand at this time. No sharp debridement was necessary today. Integumentary (Hair, Skin) Wound #2 status is Open. Original cause of wound was Blister. The date acquired was: 09/14/2021. The wound has been in treatment 7 weeks. The wound is located on the Left,Circumferential Lower Leg. The wound measures 0.1cm length x 0.1cm width x 0.1cm  depth; 0.008cm^2 area and 0.001cm^3 volume. There is no tunneling or undermining noted. There is a medium amount of serosanguineous drainage noted. The wound margin is distinct with the outline attached to the wound base. There is medium (34-66%) red, pink granulation within the wound bed. There is a medium (34-66%) amount of necrotic tissue within the wound bed including Adherent Slough. Wound #3 status is Open. Original cause of wound was Blister. The date acquired was: 09/14/2021. The wound has been in treatment 7 weeks. The wound is located on the Right,Anterior Lower Leg. The wound measures 5cm length x 2cm width x 0.1cm depth; 7.854cm^2 area and 0.785cm^3 volume. There is Fat Layer (Subcutaneous Tissue) exposed. There is no tunneling or undermining noted. There is a medium amount of serosanguineous drainage noted. The wound margin is distinct with the outline attached to the wound base. There is medium (34-66%) red, pink granulation within the wound bed. There is a medium (34-66%) amount of necrotic tissue within the wound bed including Adherent Slough. Wound #4 status is Open. Original cause of wound was Gradually Appeared. The date acquired was: 11/20/2021. The wound has been in treatment 2 weeks. The wound is located on the Right,Lateral Ankle. The wound measures 0.5cm length x 0.5cm width x 0.1cm depth; 0.196cm^2 area and 0.02cm^3 volume. There is no tunneling or undermining noted. There is a medium amount of serosanguineous drainage noted. The wound margin is distinct with the outline attached to the wound base. There is no granulation within the wound bed. There is a large (67-100%) amount of necrotic tissue within the wound bed including Eschar and Adherent Slough. Wound #5 status is Open. Original cause of wound was Blister. The date acquired was: 12/04/2021. The wound is located on the Right,Proximal,Anterior Lower Leg. The wound measures 1cm length x 1cm width x 0.1cm depth; 0.785cm^2 area  and 0.079cm^3 volume. There is Fat Layer (Subcutaneous Tissue) exposed. There is no tunneling or undermining noted. There is a medium amount of serosanguineous drainage noted. The wound margin is distinct  with the outline attached to the wound base. There is large (67-100%) red, pink granulation within the wound bed. There is a small (1-33%) amount of necrotic tissue within the wound bed including Adherent Slough. 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 - 12/18/21 @ 0915 w/ Margarita Grizzle and Allayne Butcher Room # 9 Other: - ****PLEASE BE SURE TO ONLY USE KERLIX AND COBAN FOR COMPRESSION WRAPS TO BOTH LEGS and START WRAPS AT BOTTOM OF TOES AND GO UP TO JUST BELOW THE BEND OF THE KNEE**** Registered Nurse to change legs dressings every other day and PRN soiled dressings!!! Bathing/ Shower/ Hygiene: May shower with protection but do not get wound dressing(s) wet. Edema Control - Lymphedema / SCD / Other: Elevate legs to the level of the heart or above for 30 minutes daily and/or when sitting, a frequency of: Avoid standing for long periods of time. Compression stocking or Garment 20-30 mm/Hg pressure to: - PT will need 20 30 compression stockings for both legs once wounds are healed. ***FACILITY . TO ORDER*** WOUND #2: - Lower Leg Wound Laterality: Left, Circumferential Cleanser: Soap and Water Every Other Day/15  Days Discharge Instructions: May shower and wash wound with dial antibacterial soap and water prior to dressing change. Cleanser: Wound Cleanser Every Other Day/15 Days Discharge Instructions: Cleanse the wound with wound cleanser prior to applying a clean dressing using gauze sponges, not tissue or cotton balls. Peri-Wound Care: Zinc Oxide Ointment 30g tube Every Other Day/15 Days Discharge Instructions: Apply Zinc Oxide to periwound with each dressing change Peri-Wound Care: Sween Lotion (Moisturizing lotion) Every Other Day/15 Days Discharge Instructions: Apply moisturizing lotion as directed Prim Dressing: Xeroform Occlusive Gauze Dressing, 4x4 in Every Other Day/15 Days ary Discharge Instructions: Apply to wound bed as instructed Secondary Dressing: ABD Pad, 5x9 Every Other Day/15 Days Discharge Instructions: Apply over primary dressing as directed. Com pression Wrap: Kerlix Roll 4.5x3.1 (in/yd) Every Other Day/15 Days Discharge Instructions: Apply Kerlix and Coban compression as directed. Com pression Wrap: Coban Self-Adherent Wrap 4x5 (in/yd) Every Other Day/15 Days Discharge Instructions: Apply over Kerlix as directed. WOUND #3: - Lower Leg Wound Laterality: Right, Anterior Cleanser: Soap and Water Every Other Day/15 Days Discharge Instructions: May shower and wash wound with dial antibacterial soap and water prior to dressing change. Cleanser: Wound Cleanser Every Other Day/15 Days Discharge Instructions: Cleanse the wound with wound cleanser prior to applying a clean dressing using gauze sponges, not tissue or cotton balls. Peri-Wound Care: Zinc Oxide Ointment 30g tube Every Other Day/15 Days Discharge Instructions: Apply Zinc Oxide to periwound with each dressing change Peri-Wound Care: Sween Lotion (Moisturizing lotion) Every Other Day/15 Days Discharge Instructions: Apply moisturizing lotion as directed Prim Dressing: Xeroform Occlusive Gauze Dressing, 4x4 in Every Other Day/15  Days ary Discharge Instructions: Apply to wound bed as instructed Secondary Dressing: ABD Pad, 5x9 Every Other Day/15 Days Discharge Instructions: Apply over primary dressing as directed. Com pression Wrap: Kerlix Roll 4.5x3.1 (in/yd) Every Other Day/15 Days Discharge Instructions: Apply Kerlix and Coban compression as directed. Com pression Wrap:  Coban Self-Adherent Wrap 4x5 (in/yd) Every Other Day/15 Days Discharge Instructions: Apply over Kerlix as directed. WOUND #4: - Ankle Wound Laterality: Right, Lateral Cleanser: Soap and Water Every Other Day/15 Days Discharge Instructions: May shower and wash wound with dial antibacterial soap and water prior to dressing change. Cleanser: Wound Cleanser Every Other Day/15 Days Discharge Instructions: Cleanse the wound with wound cleanser prior to applying a clean dressing using gauze sponges, not tissue or cotton balls. Peri-Wound Care: Zinc Oxide Ointment 30g tube Every Other Day/15 Days Discharge Instructions: Apply Zinc Oxide to periwound with each dressing change Peri-Wound Care: Sween Lotion (Moisturizing lotion) Every Other Day/15 Days Discharge Instructions: Apply moisturizing lotion as directed Prim Dressing: Xeroform Occlusive Gauze Dressing, 4x4 in Every Other Day/15 Days ary Discharge Instructions: Apply to wound bed as instructed Secondary Dressing: ABD Pad, 5x9 Every Other Day/15 Days Discharge Instructions: Apply over primary dressing as directed. Com pression Wrap: Kerlix Roll 4.5x3.1 (in/yd) Every Other Day/15 Days Discharge Instructions: Apply Kerlix and Coban compression as directed. Com pression Wrap: Coban Self-Adherent Wrap 4x5 (in/yd) Every Other Day/15 Days Discharge Instructions: Apply over Kerlix as directed. WOUND #5: - Lower Leg Wound Laterality: Right, Anterior, Proximal Cleanser: Soap and Water Every Other Day/15 Days Discharge Instructions: May shower and wash wound with dial antibacterial soap and water prior to  dressing change. Cleanser: Wound Cleanser Every Other Day/15 Days Discharge Instructions: Cleanse the wound with wound cleanser prior to applying a clean dressing using gauze sponges, not tissue or cotton balls. Peri-Wound Care: Zinc Oxide Ointment 30g tube Every Other Day/15 Days Discharge Instructions: Apply Zinc Oxide to periwound with each dressing change Peri-Wound Care: Sween Lotion (Moisturizing lotion) Every Other Day/15 Days Discharge Instructions: Apply moisturizing lotion as directed Prim Dressing: Xeroform Occlusive Gauze Dressing, 4x4 in Every Other Day/15 Days ary Discharge Instructions: Apply to wound bed as instructed Secondary Dressing: ABD Pad, 5x9 Every Other Day/15 Days Discharge Instructions: Apply over primary dressing as directed. Com pression Wrap: Kerlix Roll 4.5x3.1 (in/yd) Every Other Day/15 Days Discharge Instructions: Apply Kerlix and Coban compression as directed. Com pression Wrap: Coban Self-Adherent Wrap 4x5 (in/yd) Every Other Day/15 Days Discharge Instructions: Apply over Kerlix as directed. 1. I would recommend that we go ahead and continue with wound care measures as before and the patient is in agreement with plan this includes the use of the Xeroform gauze dressing over the open areas which I think is doing well. 2. The patient should have a Curlex and Coban wrap from the base of the toes to the knee in order to keep the swelling under control that not being done her lease was not today and he has a new blister on the right leg because of that I am try to get in touch with the facility to discuss this actually waited on hold for 5 minutes at the nurses station number that we have and never was able to get anyone I am calling back in order to try to speak with the director of nursing. 3. We will plan to see the patient back for follow-up visit in 2 weeks. We will see patient back for reevaluation in 2 weeks here in the clinic. If anything worsens or changes  patient will contact our office for additional recommendations. Addendum: [facility]. Subsequently she notes that she has put the orders in exactly how we did send them to her for the nurses in order to have the  patient graft from the base of the toes up to just below the knee. She states that she will reiterate that with the nurse that is performing the treatments again as well. Subsequently she also notes that they order the compression stockings although they will not start using them until both legs are completely healed but unfortunately they were initially sent to be the wrong size they have had to send this back and they are going to be getting that with the correct size shortly by the next time we see him at which point I hope he will be healed she states that they should have them ready to go for him. Electronic Signature(s) Signed: 12/04/2021 12:58:23 PM By: Worthy Keeler PA-C Previous Signature: 12/04/2021 10:00:28 AM Version By: Worthy Keeler PA-C Entered By: Worthy Keeler on 12/04/2021 12:58:23 -------------------------------------------------------------------------------- SuperBill Details Patient Name: Date of Service: [name] [name] 12/04/2021 Medical Record Number: [id number] Patient Account Number: [id number] Date of Birth/Sex: Treating RN: 29-May-1925 (88 y.o. 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 L97.812 Non-pressure chronic ulcer of other part of right lower leg with fat layer exposed I10 Essential (primary) hypertension I50.42 Chronic combined systolic (congestive) and diastolic (congestive) heart failure Facility Procedures Physician Procedures : CPT4 Code Description Modifier [id number] - WC PHYS LEVEL 4 - EST PT ICD-10 Diagnosis Description I87.331 Chronic venous hypertension (idiopathic) with ulcer and inflammation of right lower extremity I87.332 Chronic venous hypertension (idiopathic)  with ulcer and inflammation of left lower extremity L97.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

## 2021-12-04 NOTE — Progress Notes (Signed)
HORICE, CARRERO ([id number]) Visit Report for 12/04/2021 Arrival Information Details Patient Name: Date of Service: [name] [name], [name] [name] 12/04/2021 9:15 A M Medical Record Number: [id number] Patient Account Number: [id number] Date of Birth/Sex: Treating RN: 1925/01/03 (88 y.o. [name], [name] Primary Care Gratia Disla: [name] Other Clinician: Referring Amil Bouwman: Treating Bevan Vu/Extender: [name] in Treatment: 7 Visit Information History Since Last Visit Added or deleted any medications: No Patient Arrived: Wheel Chair Any new allergies or adverse reactions: No Arrival Time: 09:19 Had a fall or experienced change in No Accompanied By: self activities of daily living that may affect Transfer Assistance: Manual risk of falls: Patient Identification Verified: Yes Signs or symptoms of abuse/neglect since last visito No Secondary Verification Process Completed: Yes Hospitalized since last visit: No Patient Requires Transmission-Based Precautions: No Implantable device outside of the clinic excluding No Patient Has Alerts: Yes cellular tissue based products placed in the center Patient Alerts: ABI's 05/23 R: N/C since last visit: Has Dressing in Place as Prescribed: Yes Has Compression in Place as Prescribed: Yes Pain Present Now: No Electronic Signature(s) Signed: 12/04/2021 4:06:51 PM By: Rhae Hammock RN Entered By: Rhae Hammock on 12/04/2021 09:19:49 -------------------------------------------------------------------------------- Clinic Level of Care Assessment Details Patient Name: Date of Service: [name] [name], [name] [name] 12/04/2021 9:15 A M Medical Record Number: [id number] Patient Account Number: [id number] Date of Birth/Sex: Treating RN: 1925/06/13 (88 y.o. [name], [name] Primary Care Derian Pfost: [name] Other Clinician: Referring Blakely Gluth: Treating Theodis Kinsel/Extender: [name] in  Treatment: 7 Clinic Level of Care Assessment Items TOOL 4 Quantity Score X- 1 0 Use when only an EandM is performed on FOLLOW-UP visit ASSESSMENTS - Nursing Assessment / Reassessment X- 1 10 Reassessment of Co-morbidities (includes updates in patient status) X- 1 5 Reassessment of Adherence to Treatment Plan ASSESSMENTS - Wound and Skin A ssessment / Reassessment '[]'$  - 0 Simple Wound Assessment / Reassessment - one wound X- 4 5 Complex Wound Assessment / Reassessment - multiple wounds '[]'$  - 0 Dermatologic / Skin Assessment (not related to wound area) ASSESSMENTS - Focused Assessment X- 2 5 Circumferential Edema Measurements - multi extremities '[]'$  - 0 Nutritional Assessment / Counseling / Intervention '[]'$  - 0 Lower Extremity Assessment (monofilament, tuning fork, pulses) '[]'$  - 0 Peripheral Arterial Disease Assessment (using hand held doppler) ASSESSMENTS - Ostomy and/or Continence Assessment and Care '[]'$  - 0 Incontinence Assessment and Management '[]'$  - 0 Ostomy Care Assessment and Management (repouching, etc.) PROCESS - Coordination of Care '[]'$  - 0 Simple Patient / Family Education for ongoing care X- 1 20 Complex (extensive) Patient / Family Education for ongoing care X- 1 10 Staff obtains Programmer, systems, Records, T Results / Process Orders est X- 1 10 Staff telephones HHA, Nursing Homes / Clarify orders / etc '[]'$  - 0 Routine Transfer to another Facility (non-emergent condition) '[]'$  - 0 Routine Hospital Admission (non-emergent condition) '[]'$  - 0 New Admissions / Biomedical engineer / Ordering NPWT Apligraf, etc. , '[]'$  - 0 Emergency Hospital Admission (emergent condition) '[]'$  - 0 Simple Discharge Coordination X- 1 15 Complex (extensive) Discharge Coordination PROCESS - Special Needs '[]'$  - 0 Pediatric / Minor Patient Management '[]'$  - 0 Isolation Patient Management '[]'$  - 0 Hearing / Language / Visual special needs '[]'$  - 0 Assessment of Community assistance (transportation,  D/C planning, etc.) '[]'$  - 0 Additional assistance / Altered mentation '[]'$  - 0 Support Surface(s) Assessment (bed, cushion, seat, etc.) INTERVENTIONS - Wound Cleansing / Measurement '[]'$  - 0 Simple  Wound Cleansing - one wound X- 4 5 Complex Wound Cleansing - multiple wounds X- 1 5 Wound Imaging (photographs - any number of wounds) '[]'$  - 0 Wound Tracing (instead of photographs) '[]'$  - 0 Simple Wound Measurement - one wound X- 4 5 Complex Wound Measurement - multiple wounds INTERVENTIONS - Wound Dressings '[]'$  - 0 Small Wound Dressing one or multiple wounds X- 4 15 Medium Wound Dressing one or multiple wounds '[]'$  - 0 Large Wound Dressing one or multiple wounds '[]'$  - 0 Application of Medications - topical '[]'$  - 0 Application of Medications - injection INTERVENTIONS - Miscellaneous '[]'$  - 0 External ear exam '[]'$  - 0 Specimen Collection (cultures, biopsies, blood, body fluids, etc.) '[]'$  - 0 Specimen(s) / Culture(s) sent or taken to Lab for analysis '[]'$  - 0 Patient Transfer (multiple staff / Civil Service fast streamer / Similar devices) '[]'$  - 0 Simple Staple / Suture removal (25 or less) '[]'$  - 0 Complex Staple / Suture removal (26 or more) '[]'$  - 0 Hypo / Hyperglycemic Management (close monitor of Blood Glucose) '[]'$  - 0 Ankle / Brachial Index (ABI) - do not check if billed separately X- 1 5 Vital Signs Has the patient been seen at the hospital within the last three years: Yes Total Score: 210 Level Of Care: New/Established - Level 5 Electronic Signature(s) Signed: 12/04/2021 4:06:51 PM By: Rhae Hammock RN Entered By: Rhae Hammock on 12/04/2021 09:45:42 -------------------------------------------------------------------------------- Encounter Discharge Information Details Patient Name: Date of Service: [name] [name]. 12/04/2021 9:15 A M Medical Record Number: [id number] Patient Account Number: [id number] Date of Birth/Sex: Treating RN: [birth year] (88 y.o. [name], [name] Primary  Care Damyn Weitzel: [name] Other Clinician: Referring Kelse Ploch: Treating Jasa Dundon/Extender: [name] in Treatment: 7 Encounter Discharge Information Items Discharge Condition: Stable Ambulatory Status: Wheelchair Discharge Destination: Skilled Nursing Facility Telephoned: No Orders Sent: Yes Transportation: Private Auto Accompanied By: self Schedule Follow-up Appointment: Yes Clinical Summary of Care: Patient Declined Electronic Signature(s) Signed: 12/04/2021 4:06:51 PM By: Rhae Hammock RN Entered By: Rhae Hammock on 12/04/2021 09:47:12 -------------------------------------------------------------------------------- Lower Extremity Assessment Details Patient Name: Date of Service: [name] [name] 12/04/2021 9:15 A M Medical Record Number: [id number] Patient Account Number: [id number] Date of Birth/Sex: Treating RN: 04-13-1925 (88 y.o. [name], [name] Primary Care Mattheo Swindle: [name] Other Clinician: Referring Lorelie Biermann: Treating Rakia Frayne/Extender: Patricia Nettle, Meredith Staggers Weeks in Treatment: 7 Edema Assessment Assessed: Shirlyn Goltz: Yes] Patrice Paradise: Yes] Edema: [Left: Yes] [Right: Yes] Calf Left: Right: Point of Measurement: 34 cm From Medial Instep 36 cm 35 cm Ankle Left: Right: Point of Measurement: 9 cm From Medial Instep 22 cm 22 cm Vascular Assessment Pulses: Dorsalis Pedis Palpable: [Left:Yes] [Right:Yes] Posterior Tibial Palpable: [Left:Yes] [Right:Yes] Electronic Signature(s) Signed: 12/04/2021 4:06:51 PM By: Rhae Hammock RN Entered By: Rhae Hammock on 12/04/2021 09:20:37 -------------------------------------------------------------------------------- [name] [name] Details Patient Name: Date of Service: [name] [name]. 12/04/2021 9:15 A M Medical Record Number: [id number] Patient Account Number: [id number] Date of Birth/Sex: Treating RN: 1924-06-22 (88 y.o. [name],  [name] Primary Care Joeann Steppe: [name] Other Clinician: Referring Aanika Defoor: Treating Eria Lozoya/Extender: [name] in Treatment: 7 Active Inactive Wound/Skin Impairment Nursing Diagnoses: Impaired tissue integrity Knowledge deficit related to ulceration/compromised skin integrity Goals: Patient will have a decrease in wound volume by X% from date: (specify in notes) Date Initiated: 10/16/2021 Target Resolution Date: 12/14/2021 Goal Status: Active Patient/caregiver will verbalize understanding of skin care regimen Date Initiated: 10/16/2021 Target Resolution Date: 12/14/2021 Goal Status: Active  Ulcer/skin breakdown will have a volume reduction of 30% by week 4 Date Initiated: 10/16/2021 Target Resolution Date: 12/14/2021 Goal Status: Active Interventions: Assess patient/caregiver ability to obtain necessary supplies Assess patient/caregiver ability to perform ulcer/skin care regimen upon admission and as needed Assess ulceration(s) every visit Notes: Electronic Signature(s) Signed: 12/04/2021 4:06:51 PM By: Rhae Hammock RN Entered By: Rhae Hammock on 12/04/2021 09:44:01 -------------------------------------------------------------------------------- Pain Assessment Details Patient Name: Date of Service: [name] [name] 12/04/2021 9:15 A M Medical Record Number: [id number] Patient Account Number: [id number] Date of Birth/Sex: Treating RN: 26-Feb-1925 (88 y.o. [name], [name] Primary Care Ariely Riddell: Other Clinician: [name] Referring Nelson Julson: Treating Nicola Quesnell/Extender: [name] in Treatment: 7 Active Problems Location of Pain Severity and Description of Pain Patient Has Paino No Site Locations Pain Management and Medication Current Pain Management: Electronic Signature(s) Signed: 12/04/2021 4:06:51 PM By: Rhae Hammock RN Entered By: Rhae Hammock on 12/04/2021  09:20:26 -------------------------------------------------------------------------------- Patient/Caregiver Education Details Patient Name: Date of Service: [name] [name] 6/21/2023andnbsp9:15 [place] Record Number: [id number] Patient Account Number: [id number] Date of Birth/Gender: Treating RN: 24-Jul-1924 (88 y.o. Erie Noe Primary Care Physician: [name] Other Clinician: Referring Physician: Treating Physician/Extender: [name] in Treatment: 7 Education Assessment Education Provided To: Patient Education Topics Provided Wound/Skin Impairment: Methods: Explain/Verbal Responses: Reinforcements needed, State content correctly Electronic Signature(s) Signed: 12/04/2021 4:06:51 PM By: Rhae Hammock RN Entered By: Rhae Hammock on 12/04/2021 09:44:12 -------------------------------------------------------------------------------- Wound Assessment Details Patient Name: Date of Service: [name] [name]. 12/04/2021 9:15 A M Medical Record Number: [id number] Patient Account Number: [id number] Date of Birth/Sex: Treating RN: 1924/08/27 (88 y.o. [name], [name] Primary Care Remy Dia: [name] Other Clinician: Referring Journii Nierman: Treating Cherika Jessie/Extender: Patricia Nettle, Ihor Gully in Treatment: 7 Wound Status Wound Number: 2 Primary Venous Leg Ulcer Etiology: Wound Location: Left, Circumferential Lower Leg Wound Open Wounding Event: Blister Status: Date Acquired: 09/14/2021 Comorbid Arrhythmia, Congestive Heart Failure, Coronary Artery Disease, Weeks Of Treatment: 7 History: Hypertension, Peripheral Venous Disease, Osteoarthritis, Seizure Clustered Wound: Yes Disorder Photos Wound Measurements Length: (cm) 0.1 Width: (cm) 0.1 Depth: (cm) 0.1 Clustered Quantity: 1 Area: (cm) 0.008 Volume: (cm) 0.001 % Reduction in Area: 100% % Reduction in Volume: 100% Epithelialization: Small  (1-33%) Tunneling: No Undermining: No Wound Description Classification: Full Thickness With Exposed Support Structures Wound Margin: Distinct, outline attached Exudate Amount: Medium Exudate Type: Serosanguineous Exudate Color: red, brown Foul Odor After Cleansing: No Slough/Fibrino Yes Wound Bed Granulation Amount: Medium (34-66%) Exposed Structure Granulation Quality: Red, Pink Fascia Exposed: No Necrotic Amount: Medium (34-66%) Fat Layer (Subcutaneous Tissue) Exposed: No Necrotic Quality: Adherent Slough Tendon Exposed: No Muscle Exposed: No Joint Exposed: No Bone Exposed: No Treatment Notes Wound #2 (Lower Leg) Wound Laterality: Left, Circumferential Cleanser Soap and Water Discharge Instruction: May shower and wash wound with dial antibacterial soap and water prior to dressing change. Wound Cleanser Discharge Instruction: Cleanse the wound with wound cleanser prior to applying a clean dressing using gauze sponges, not tissue or cotton balls. Peri-Wound Care Zinc Oxide Ointment 30g tube Discharge Instruction: Apply Zinc Oxide to periwound with each dressing change Sween Lotion (Moisturizing lotion) Discharge Instruction: Apply moisturizing lotion as directed Topical Primary Dressing Xeroform Occlusive Gauze Dressing, 4x4 in Discharge Instruction: Apply to wound bed as instructed Secondary Dressing ABD Pad, 5x9 Discharge Instruction: Apply over primary dressing as directed. Secured With Compression Wrap Kerlix Roll 4.5x3.1 (in/yd) Discharge Instruction: Apply Kerlix and Coban compression as directed. Coban  Self-Adherent Wrap 4x5 (in/yd) Discharge Instruction: Apply over Kerlix as directed. Compression Stockings Add-Ons Electronic Signature(s) Signed: 12/04/2021 4:06:51 PM By: Rhae Hammock RN Entered By: Rhae Hammock on 12/04/2021 09:27:39 -------------------------------------------------------------------------------- Wound Assessment Details Patient  Name: Date of Service: [name] [name], [name] [name] 12/04/2021 9:15 A M Medical Record Number: [id number] Patient Account Number: [id number] Date of Birth/Sex: Treating RN: January 27, 1925 (88 y.o. [name], [name] Primary Care Addaline Peplinski: [name] Other Clinician: Referring Dorthie Santini: Treating Khushboo Chuck/Extender: Patricia Nettle, Ihor Gully in Treatment: 7 Wound Status Wound Number: 3 Primary Venous Leg Ulcer Etiology: Wound Location: Right, Anterior Lower Leg Wound Open Wounding Event: Blister Status: Date Acquired: 09/14/2021 Comorbid Arrhythmia, Congestive Heart Failure, Coronary Artery Disease, Weeks Of Treatment: 7 History: Hypertension, Peripheral Venous Disease, Osteoarthritis, Seizure Clustered Wound: Yes Disorder Photos Wound Measurements Length: (cm) 5 Width: (cm) 2 Depth: (cm) 0.1 Clustered Quantity: 2 Area: (cm) 7.854 Volume: (cm) 0.785 % Reduction in Area: 98.1% % Reduction in Volume: 98.1% Epithelialization: None Tunneling: No Undermining: No Wound Description Classification: Full Thickness With Exposed Support Structures Wound Margin: Distinct, outline attached Exudate Amount: Medium Exudate Type: Serosanguineous Exudate Color: red, brown Foul Odor After Cleansing: No Slough/Fibrino Yes Wound Bed Granulation Amount: Medium (34-66%) Exposed Structure Granulation Quality: Red, Pink Fascia Exposed: No Necrotic Amount: Medium (34-66%) Fat Layer (Subcutaneous Tissue) Exposed: Yes Necrotic Quality: Adherent Slough Tendon Exposed: No Muscle Exposed: No Joint Exposed: No Bone Exposed: No Treatment Notes Wound #3 (Lower Leg) Wound Laterality: Right, Anterior Cleanser Soap and Water Discharge Instruction: May shower and wash wound with dial antibacterial soap and water prior to dressing change. Wound Cleanser Discharge Instruction: Cleanse the wound with wound cleanser prior to applying a clean dressing using gauze sponges, not tissue or  cotton balls. Peri-Wound Care Zinc Oxide Ointment 30g tube Discharge Instruction: Apply Zinc Oxide to periwound with each dressing change Sween Lotion (Moisturizing lotion) Discharge Instruction: Apply moisturizing lotion as directed Topical Primary Dressing Xeroform Occlusive Gauze Dressing, 4x4 in Discharge Instruction: Apply to wound bed as instructed Secondary Dressing ABD Pad, 5x9 Discharge Instruction: Apply over primary dressing as directed. Secured With Compression Wrap Kerlix Roll 4.5x3.1 (in/yd) Discharge Instruction: Apply Kerlix and Coban compression as directed. Coban Self-Adherent Wrap 4x5 (in/yd) Discharge Instruction: Apply over Kerlix as directed. Compression Stockings Add-Ons Electronic Signature(s) Signed: 12/04/2021 4:06:51 PM By: Rhae Hammock RN Entered By: Rhae Hammock on 12/04/2021 09:27:06 -------------------------------------------------------------------------------- Wound Assessment Details Patient Name: Date of Service: [name] [name], [name] [name] 12/04/2021 9:15 A M Medical Record Number: [id number] Patient Account Number: [id number] Date of Birth/Sex: Treating RN: September 22, 1924 (88 y.o. [name], [name] Primary Care Phoebe Marter: [name] Other Clinician: Referring Wisdom Seybold: Treating Sholom Dulude/Extender: Patricia Nettle, Ihor Gully in Treatment: 7 Wound Status Wound Number: 4 Primary Venous Leg Ulcer Etiology: Wound Location: Right, Lateral Ankle Wound Open Wounding Event: Gradually Appeared Status: Date Acquired: 11/20/2021 Comorbid Arrhythmia, Congestive Heart Failure, Coronary Artery Disease, Weeks Of Treatment: 2 History: Hypertension, Peripheral Venous Disease, Osteoarthritis, Seizure Clustered Wound: No Disorder Photos Wound Measurements Length: (cm) 0.5 Width: (cm) 0.5 Depth: (cm) 0.1 Area: (cm) 0.196 Volume: (cm) 0.02 % Reduction in Area: 75% % Reduction in Volume: 74.7% Epithelialization: None Tunneling:  No Undermining: No Wound Description Classification: Full Thickness With Exposed Support Structures Wound Margin: Distinct, outline attached Exudate Amount: Medium Exudate Type: Serosanguineous Exudate Color: red, brown Foul Odor After Cleansing: No Slough/Fibrino Yes Wound Bed Granulation Amount: None Present (0%) Necrotic Amount: Large (67-100%) Necrotic Quality: Eschar, Adherent Slough Treatment Notes Wound #4 (Ankle)  Wound Laterality: Right, Lateral Cleanser Soap and Water Discharge Instruction: May shower and wash wound with dial antibacterial soap and water prior to dressing change. Wound Cleanser Discharge Instruction: Cleanse the wound with wound cleanser prior to applying a clean dressing using gauze sponges, not tissue or cotton balls. Peri-Wound Care Zinc Oxide Ointment 30g tube Discharge Instruction: Apply Zinc Oxide to periwound with each dressing change Sween Lotion (Moisturizing lotion) Discharge Instruction: Apply moisturizing lotion as directed Topical Primary Dressing Xeroform Occlusive Gauze Dressing, 4x4 in Discharge Instruction: Apply to wound bed as instructed Secondary Dressing ABD Pad, 5x9 Discharge Instruction: Apply over primary dressing as directed. Secured With Compression Wrap Kerlix Roll 4.5x3.1 (in/yd) Discharge Instruction: Apply Kerlix and Coban compression as directed. Coban Self-Adherent Wrap 4x5 (in/yd) Discharge Instruction: Apply over Kerlix as directed. Compression Stockings Add-Ons Electronic Signature(s) Signed: 12/04/2021 4:06:51 PM By: Rhae Hammock RN Entered By: Rhae Hammock on 12/04/2021 09:26:45 -------------------------------------------------------------------------------- Wound Assessment Details Patient Name: Date of Service: [name] [name] 12/04/2021 9:15 A M Medical Record Number: [id number] Patient Account Number: [id number] Date of Birth/Sex: Treating RN: Nov 10, 1924 (88 y.o. [name],  [name] Primary Care Shafiq Larch: [name] Other Clinician: Referring Toua Stites: Treating Esmeralda Blanford/Extender: Patricia Nettle, Ihor Gully in Treatment: 7 Wound Status Wound Number: 5 Primary Venous Leg Ulcer Etiology: Wound Location: Right, Proximal, Anterior Lower Leg Wound Open Wounding Event: Blister Status: Date Acquired: 12/04/2021 Comorbid Arrhythmia, Congestive Heart Failure, Coronary Artery Disease, Weeks Of Treatment: 0 History: Hypertension, Peripheral Venous Disease, Osteoarthritis, Seizure Clustered Wound: No Disorder Photos Wound Measurements Length: (cm) 1 Width: (cm) 1 Depth: (cm) 0.1 Area: (cm) 0.785 Volume: (cm) 0.079 % Reduction in Area: 0% % Reduction in Volume: 0% Epithelialization: None Tunneling: No Undermining: No Wound Description Classification: Full Thickness With Exposed Support Structures Wound Margin: Distinct, outline attached Exudate Amount: Medium Exudate Type: Serosanguineous Exudate Color: red, brown Foul Odor After Cleansing: No Slough/Fibrino Yes Wound Bed Granulation Amount: Large (67-100%) Exposed Structure Granulation Quality: Red, Pink Fascia Exposed: No Necrotic Amount: Small (1-33%) Fat Layer (Subcutaneous Tissue) Exposed: Yes Necrotic Quality: Adherent Slough Tendon Exposed: No Muscle Exposed: No Joint Exposed: No Bone Exposed: No Treatment Notes Wound #5 (Lower Leg) Wound Laterality: Right, Anterior, Proximal Cleanser Soap and Water Discharge Instruction: May shower and wash wound with dial antibacterial soap and water prior to dressing change. Wound Cleanser Discharge Instruction: Cleanse the wound with wound cleanser prior to applying a clean dressing using gauze sponges, not tissue or cotton balls. Peri-Wound Care Zinc Oxide Ointment 30g tube Discharge Instruction: Apply Zinc Oxide to periwound with each dressing change Sween Lotion (Moisturizing lotion) Discharge Instruction: Apply moisturizing  lotion as directed Topical Primary Dressing Xeroform Occlusive Gauze Dressing, 4x4 in Discharge Instruction: Apply to wound bed as instructed Secondary Dressing ABD Pad, 5x9 Discharge Instruction: Apply over primary dressing as directed. Secured With Compression Wrap Kerlix Roll 4.5x3.1 (in/yd) Discharge Instruction: Apply Kerlix and Coban compression as directed. Coban Self-Adherent Wrap 4x5 (in/yd) Discharge Instruction: Apply over Kerlix as directed. Compression Stockings Add-Ons Electronic Signature(s) Signed: 12/04/2021 4:06:51 PM By: Rhae Hammock RN Entered By: Rhae Hammock on 12/04/2021 09:27:21 -------------------------------------------------------------------------------- Vitals Details Patient Name: Date of Service: [name] [name]. 12/04/2021 9:15 A M Medical Record Number: [id number] Patient Account Number: [id number] Date of Birth/Sex: Treating RN: 1925-04-28 (88 y.o. [name], [name] Primary Care Leiloni Smithers: [name] Other Clinician: Referring Ruvim Risko: Treating Kayra Crowell/Extender: [name] in Treatment: 7 Vital Signs Time Taken: 09:19 Temperature (F): 98.2 Pulse (bpm): 64  Respiratory Rate (breaths/min): 17 Blood Pressure (mmHg): 131/85 Reference Range: 80 - 120 mg / dl Electronic Signature(s) Signed: 12/04/2021 4:06:51 PM By: Rhae Hammock RN Entered By: Rhae Hammock on 12/04/2021 09:20:21

## 2021-12-12 ENCOUNTER — Encounter: Payer: Self-pay | Admitting: Adult Health

## 2021-12-12 ENCOUNTER — Non-Acute Institutional Stay (SKILLED_NURSING_FACILITY): Payer: Medicare HMO | Admitting: Adult Health

## 2021-12-12 DIAGNOSIS — I83019 Varicose veins of right lower extremity with ulcer of unspecified site: Secondary | ICD-10-CM

## 2021-12-12 DIAGNOSIS — I83029 Varicose veins of left lower extremity with ulcer of unspecified site: Secondary | ICD-10-CM

## 2021-12-12 DIAGNOSIS — Z8673 Personal history of transient ischemic attack (TIA), and cerebral infarction without residual deficits: Secondary | ICD-10-CM | POA: Diagnosis not present

## 2021-12-12 DIAGNOSIS — L97919 Non-pressure chronic ulcer of unspecified part of right lower leg with unspecified severity: Secondary | ICD-10-CM

## 2021-12-12 DIAGNOSIS — B372 Candidiasis of skin and nail: Secondary | ICD-10-CM | POA: Diagnosis not present

## 2021-12-12 DIAGNOSIS — K644 Residual hemorrhoidal skin tags: Secondary | ICD-10-CM | POA: Diagnosis not present

## 2021-12-12 DIAGNOSIS — L97929 Non-pressure chronic ulcer of unspecified part of left lower leg with unspecified severity: Secondary | ICD-10-CM | POA: Diagnosis not present

## 2021-12-12 NOTE — Progress Notes (Signed)
Location:  [facility] Room Number: N061/A Place of Service:  SNF (31) Provider:  Durenda Age, DNP, FNP-BC  Patient Care Team: [name], MD as PCP - General (Internal Medicine) Gatha Mayer, MD as Consulting Physician (Gastroenterology) [name], [name], MD as Consulting Physician (Cardiology) [name], MD as Attending Physician (Internal Medicine) Mast, Man X, NP as Nurse Practitioner (Internal Medicine)  Extended Emergency Contact Information Primary Emergency Contact: Romig,[name] Address: [place], [place] [address] [name] Phone: [phone] Mobile Phone: [phone] Relation: Daughter Secondary Emergency Contact: Goldin,[name] Address: [place] [address] [name] [place] Phone: [phone] Mobile Phone: [phone] Relation: Son  Code Status:  DNR  Goals of care: Advanced Directive information    12/12/2021   10:59 AM  Advanced Directives  Does Patient Have a Medical Advance Directive? Yes  Type of Paramedic of [place];Living will;Out of facility DNR (pink MOST or yellow form)  Does patient want to make changes to medical advance directive? No - Patient declined  Copy of [place] in Chart? Yes - validated most recent copy scanned in chart (See row information)     Chief Complaint  Patient presents with   Acute Visit    Rectal bleed    HPI:  Pt is a 88 y.o. male seen today for an acute visit regarding rectal bleed. He is a long-term care resident of [facility]. He has a PMH of BPH, bradycardia, CAD, hypertension, diastolic CHF and GERD. CNA reported that when they wiped his anal area, they noticed blood. There was no dripping blood and were just noted on the wipes. He was seen in the room today with charge nurse. He is currently taking ASA 325 mg daily for history  of TIA.He denies abdominal pain. He was noted to have external hemorrhoid. No active bleeding noted on his rectum. He has erythematous rashes on his bilateral buttocks and groin area. Nystatin powder applied to erythematous rashes. He has venous ulcers on BLE and is wrapped with Kerlix and coban.   Past Medical History:  Diagnosis Date   Allergic rhinitis    Amnesia    Atherosclerotic heart disease of native coronary artery without angina pectoris    BPH (benign prostatic hyperplasia)    Bradycardia    Chronic diastolic (congestive) heart failure (HCC)    Colon polyps    Coronary artery disease    MODERATE   Dizziness    Edema of lower extremity    Erectile dysfunction    H. pylori infection    Hx of    Hard of hearing    Headache(784.0)    History of colon polyps 2009   Dr. Orr/Colonoscopy -small cecal adenoma   History of falling    Hx of colonoscopy 2009   Dr Lajoyce Corners, hemorrhoids & polyps   Hyperlipidemia    Hypertension    Hypertensive heart disease with heart failure [facility])    Hypokalemia    Internal hemorrhoids 2009   Dr. Lajoyce Corners Colonoscopy   Metabolic encephalopathy    Mitral valve regurgitation    Muscle weakness (generalized)    Pneumonia    Primary generalized (osteo)arthritis    Reflux esophagitis    Sepsis ([place]) 11/04/2018   Unspecified hemorrhoids    Unsteadiness on feet    Vitamin  D deficiency    Past Surgical History:  Procedure Laterality Date   APPENDECTOMY     CARDIAC CATHETERIZATION  1997   REVEALED MILD TO MODERATE IRREGULARITIES. HIS LEFT EVNTRICULAR SYSTOLIC FUNCTION REVEALED NORMAL EJECTION FRACTION WITH EF OF 70%   CATARACT EXTRACTION, BILATERAL     COLONOSCOPY  multiple   fatty tumor removal     benign   HEMORROIDECTOMY     HIP ARTHROPLASTY Left 08/17/2013   Procedure: ARTHROPLASTY BIPOLAR HIP;  Surgeon: Gearlean Alf, MD;  Location: [facility];  Service: Orthopedics;  Laterality: Left;   INGUINAL HERNIA REPAIR Bilateral 2006   lower back surgery   2006   NASAL SEPTUM SURGERY     TONSILLECTOMY     UPPER GASTROINTESTINAL ENDOSCOPY      No Known Allergies  Outpatient Encounter Medications as of 12/12/2021  Medication Sig   acetaminophen (TYLENOL) 500 MG tablet Take 500 mg by mouth every 8 (eight) hours as needed.   amLODipine (NORVASC) 5 MG tablet Take 5 mg by mouth daily.   aspirin 325 MG tablet Take 325 mg by mouth daily.   DEXTRAN 70-HYPROMELLOSE OP Apply 1 drop to eye 3 (three) times daily as needed. Special Instructions: as needed for dry eyes/itching   Emollient (CERAVE) CREA Apply topically at bedtime. To lower extremities @ bedtime   hydrocortisone (ANUSOL-HC) 2.5 % rectal cream Place 1 application rectally 2 (two) times daily as needed for anal itching or hemorrhoids (USE PERINEAL APPLICATOR). With perineal applicator   levofloxacin (LEVAQUIN) 500 MG tablet Take 500 mg by mouth daily.   lisinopril (ZESTRIL) 40 MG tablet Take 40 mg by mouth daily.   Multiple Vitamins-Minerals (THERA-M) TABS Take 1 tablet by mouth daily.   omeprazole (PRILOSEC) 20 MG capsule Take 20 mg by mouth daily.   potassium chloride SA (KLOR-CON) 20 MEQ tablet Take 20 mEq by mouth daily.   rosuvastatin (CRESTOR) 5 MG tablet Take 5 mg by mouth daily.    torsemide (DEMADEX) 20 MG tablet Take 20 mg by mouth daily.   vitamin B-12 (CYANOCOBALAMIN) 1000 MCG tablet Take 1,000 mcg by mouth daily.   [DISCONTINUED] levETIRAcetam (KEPPRA) 500 MG tablet Take 1 tablet (500 mg total) by mouth 2 (two) times daily.   [DISCONTINUED] saccharomyces boulardii (FLORASTOR) 250 MG capsule Take 250 mg by mouth 2 (two) times daily.   No facility-administered encounter medications on file as of 12/12/2021.    Review of Systems  Constitutional:  Negative for activity change, appetite change and fever.  HENT:  Negative for sore throat.   Eyes: Negative.   Cardiovascular:  Negative for chest pain and leg swelling.  Gastrointestinal:  Negative for abdominal distention, diarrhea  and vomiting.  Genitourinary:  Negative for dysuria, frequency and urgency.  Skin:  Positive for rash. Negative for color change.  Neurological:  Negative for dizziness and headaches.  Psychiatric/Behavioral:  Negative for behavioral problems and sleep disturbance. The patient is not nervous/anxious.       Immunization History  Administered Date(s) Administered   Influenza, High Dose Seasonal PF 03/19/2019, 03/19/2019   Influenza-Unspecified 04/08/2016, 03/27/2020, 04/03/2021   Moderna Sars-Covid-2 Vaccination 06/18/2019, 07/16/2019, 04/24/2020, 11/13/2020   Pfizer Covid-19 Vaccine Bivalent Booster 66yr & up 03/05/2021   Pneumococcal Conjugate-13 06/02/2013   Pneumococcal Polysaccharide-23 04/07/2001   Tetanus 08/14/2013   Zoster Recombinat (Shingrix) 08/03/2021   Pertinent  Health Maintenance Due  Topic Date Due   INFLUENZA VACCINE  01/14/2022      09/21/2019    8:15 PM  09/22/2019    7:30 AM 09/22/2019    9:00 PM 09/23/2019    9:30 AM 01/12/2020    3:02 AM  Fall Risk  Patient Fall Risk Level High fall risk High fall risk High fall risk High fall risk High fall risk     Vitals:   12/12/21 1056  BP: (!) 148/72  Pulse: 64  Resp: 20  Temp: 98 F (36.7 C)  SpO2: 96%  Weight: 176 lb 14.4 oz (80.2 kg)  Height: '5\' 7"'$  (1.702 m)   Body mass index is 27.71 kg/m.  Physical Exam Constitutional:      Appearance: Normal appearance.  HENT:     Head: Normocephalic and atraumatic.     Mouth/Throat:     Mouth: Mucous membranes are moist.  Eyes:     Conjunctiva/sclera: Conjunctivae normal.  Cardiovascular:     Rate and Rhythm: Normal rate. Rhythm irregular.     Pulses: Normal pulses.     Heart sounds: Normal heart sounds.  Pulmonary:     Effort: Pulmonary effort is normal.     Breath sounds: Normal breath sounds.  Abdominal:     General: Bowel sounds are normal.     Palpations: Abdomen is soft.  Musculoskeletal:        General: No swelling.     Cervical back: Normal range  of motion.     Comments: BLE with generalized weakness.  Skin:    General: Skin is warm and dry.     Comments: See HPI  Neurological:     Mental Status: He is alert and oriented to person, place, and time. Mental status is at baseline.  Psychiatric:        Mood and Affect: Mood normal.        Behavior: Behavior normal.        Thought Content: Thought content normal.        Judgment: Judgment normal.        Labs reviewed: Recent Labs    07/18/21 0000 10/15/21 0000 11/21/21 0000  NA 141 143 143  K 4.3 5.1 4.1  CL 102 106 108  CO2 31* 24* 30*  BUN 16 35* 22*  CREATININE 1.0 1.3 1.1  CALCIUM 9.3 9.6 8.7   Recent Labs    07/18/21 0000  AST 19  ALT 17  ALKPHOS 61  ALBUMIN 3.9   Recent Labs    07/18/21 0000  WBC 7.4  HGB 14.1  HCT 41  PLT 223   Lab Results  Component Value Date   TSH 1.77 04/06/2020   No results found for: "HGBA1C" Lab Results  Component Value Date   CHOL 131 07/18/2021   HDL 57 07/18/2021   LDLCALC 61 07/18/2021   TRIG 57 07/18/2021   CHOLHDL 2.7 02/17/2017    Significant Diagnostic Results in last 30 days:  No results found.  Assessment/Plan  1. External hemorrhoid -  bleeding is thought to be from hemorrhoids, denies constipation -  will start Anusol HC cream 2.5% cream to external hemorrhoids BID PRN -  CBC with differential next lab draw  2. History of TIA (transient ischemic attack) -  no recent episode -  will continue ASA 325 mg daily  3. Venous ulcers of both lower extremities (HCC) -  continue treatment to lower extremities -  follows up with wound clinic  4.  Candidal skin infection -  continue Nystatin powder application to rashes on bilateral groin and buttocks BID     Family/ staff  Communication: Discussed plan of care with resident and charge nurse.  Labs/tests ordered:   CBC with differentials    Durenda Age, DNP, MSN, FNP-BC [facility] [phone]  (Monday-Friday 8:00 a.m. - 5:00 p.m.) [phone] (after hours)

## 2021-12-18 ENCOUNTER — Encounter (HOSPITAL_BASED_OUTPATIENT_CLINIC_OR_DEPARTMENT_OTHER): Payer: Medicare HMO | Attending: Physician Assistant | Admitting: Physician Assistant

## 2021-12-18 DIAGNOSIS — G40909 Epilepsy, unspecified, not intractable, without status epilepticus: Secondary | ICD-10-CM | POA: Diagnosis not present

## 2021-12-18 DIAGNOSIS — I5042 Chronic combined systolic (congestive) and diastolic (congestive) heart failure: Secondary | ICD-10-CM | POA: Insufficient documentation

## 2021-12-18 DIAGNOSIS — L97318 Non-pressure chronic ulcer of right ankle with other specified severity: Secondary | ICD-10-CM | POA: Insufficient documentation

## 2021-12-18 DIAGNOSIS — N400 Enlarged prostate without lower urinary tract symptoms: Secondary | ICD-10-CM | POA: Diagnosis not present

## 2021-12-18 DIAGNOSIS — L97822 Non-pressure chronic ulcer of other part of left lower leg with fat layer exposed: Secondary | ICD-10-CM | POA: Insufficient documentation

## 2021-12-18 DIAGNOSIS — L97812 Non-pressure chronic ulcer of other part of right lower leg with fat layer exposed: Secondary | ICD-10-CM | POA: Diagnosis not present

## 2021-12-18 DIAGNOSIS — I87333 Chronic venous hypertension (idiopathic) with ulcer and inflammation of bilateral lower extremity: Secondary | ICD-10-CM | POA: Insufficient documentation

## 2021-12-18 DIAGNOSIS — I110 Hypertensive heart disease with heart failure: Secondary | ICD-10-CM | POA: Diagnosis not present

## 2021-12-18 DIAGNOSIS — I2510 Atherosclerotic heart disease of native coronary artery without angina pectoris: Secondary | ICD-10-CM | POA: Insufficient documentation

## 2021-12-18 NOTE — Progress Notes (Addendum)
ARLISS, FRISINA ([id number]) Visit Report for 12/18/2021 Chief Complaint Document Details Patient Name: Date of Service: [name] [name], [name] [name] 12/18/2021 9:15 A M Medical Record Number: [id number] Patient Account Number: [id number] Date of Birth/Sex: Treating RN: January 27, 1925 (88 y.o. [name], [name] Primary Care Provider: [name] Other Clinician: Referring Provider: Treating Provider/Extender: [name] in Treatment: 9 Information Obtained from: Patient Chief Complaint Bilateral LE Ulcers Electronic Signature(s) Signed: 12/18/2021 10:01:46 AM By: Worthy Keeler PA-C Entered By: Worthy Keeler on 12/18/2021 10:01:46 -------------------------------------------------------------------------------- HPI Details Patient Name: Date of Service: [name] [name], [name] [name] 12/18/2021 9:15 A M Medical Record Number: [id number] Patient Account Number: [id number] Date of Birth/Sex: Treating RN: 04-05-1925 (88 y.o. [name], [name] Primary Care Provider: [name] Other Clinician: Referring Provider: Treating Provider/Extender: [name] in Treatment: 9 History of Present Illness HPI Description: ADMISSION 04/02/2021 This is a pleasant 88 year old man sent to our clinic by Dr. Lyndel Safe from the skilled unit of [facility]. He has a wound on the right posterior calf. I am uncertain about the duration of this however it looks to be chronic. They are using Santyl covered with silver alginate he does not have any compression. The wound is 100% covered in very fibrinous adherent slough. It also appears that this is quite painful. The patient is exceptionally hard of hearing so it is difficult to get any additional history out of him. Past medical history includes hypertension, coronary artery disease, congestive heart failure, BPH, seizure disorder. He is nonambulatory for reasons that are not totally clear. He lives at the  sk[facility] unit of [facility]. ABI in our clinic was 1.03 on the right 11/2; 2-week follow-up. He is having the dressing changed at the facility. He arrives in the clinic with the area about the same in terms of surface area but better looking surface we have been using Iodoflex under 3 layer compression. Unfortunately we still do not have any history of exactly how long this is been there or how it happened. 11/16; 2-week follow-up. Nice improvement in the wound. He is using Iodoflex over 3 layer compression. 11/30 2-week follow-up. [facility] [place]. We have been using Iodoflex under 3 layer compression. They are not wrapping his leg properly but nevertheless the wound area has come down nicely. The patient requests coming here every 3 weeks instead of every 2 12/21; the area on the posterior right calf is fully epithelialized. The patient has chronic venous insufficiency. Readmission: 10-16-2021 upon evaluation today patient appears to be doing somewhat poorly in regard to the wounds on the bilateral lower extremities. He has been tolerating the dressing changes without complication. Fortunately there does not appear to be any evidence of active infection locally or systemically which is great news. Overall I am extremely pleased with where things stand. With that being said that obviously he has quite a bit of swelling and this is led to several areas of significant blistering over his bilateral lower extremities all the wounds are superficial this is the good news. Patient continues to have issues with chronic venous hypertension bilaterally with ulcerations. He also has a history of hypertension and congestive heart failure. He previously tolerated 3 layer compression wraps without complication. He was last seen and discharged in our office June 05, 2021. 10-23-2021 upon evaluation today patient appears to be doing excellent in regard to his legs. They do  look better although  there is a lot of bright green drainage I do believe this likely represents a Pseudomonas infection and that was discussed with him as best I could today again he is extremely hard of hearing. I had to write most of what I wanted him to know down. 10-30-2021 upon evaluation 20 today patient appears to be doing much better in regard to his leg ulcers. He has been taking the Levaquin without complication and overall very pleased in that regard. Fortunately I do not see any evidence of infection locally or systemically which is great news. No fevers, chills, nausea, vomiting, or diarrhea. 11-06-2021 upon evaluation today patient appears to be doing excellent in regard to his wounds. In fact the left leg is almost completely healed the right leg is significantly smaller. Overall I do believe we are on the right track here. 6/7; 2-week follow-up. The patient's left leg is essentially closed. His superficial areas anteriorly look like they are doing very well with nice rims of epithelialization. UNFORTUNATELY he has a new wound on the right lateral malleolus the exact etiology of this is not really clear but he is mostly complaining of this wound. We have been using Xeroform under kerlix Coban The patient is in the [facility]. I am uncertain whether he has 20/30 below-knee stockings or not but he is definitely going to need them. 12-04-2021 upon evaluation today patient appears to be doing better in regard to his wound especially on the left side of the lower extremity although the right side he has a new blister healing of the other wounds are very close to complete closure. This is due to it not being wrapped appropriately from the knee down to the base of the toes. This has been in the orders for the past several visits and to be honest I am not really sure why its not being done as ordered. 12-18-2021 upon evaluation today patient appears to be doing well  currently in regard to his wounds. In fact he is showing signs of excellent improvement which is great news and in fact is only the right ankle which is still open. Overall everything else is doing awesome. I do not see any evidence of active infection locally or systemically at this time. Electronic Signature(s) Signed: 12/18/2021 10:02:02 AM By: Worthy Keeler PA-C Entered By: Worthy Keeler on 12/18/2021 10:02:02 -------------------------------------------------------------------------------- Physical Exam Details Patient Name: Date of Service: [name] [name], [name] [name] 12/18/2021 9:15 A M Medical Record Number: [id number] Patient Account Number: [id number] Date of Birth/Sex: Treating RN: 16-Nov-1924 (88 y.o. [name], [name] Primary Care Provider: [name] Other Clinician: Referring Provider: Treating Provider/Extender: Patricia Nettle, Ihor Gully in Treatment: 9 Constitutional Well-nourished and well-hydrated in no acute distress. Respiratory normal breathing without difficulty. Psychiatric this patient is able to make decisions and demonstrates good insight into disease process. Alert and Oriented x 3. pleasant and cooperative. Notes Upon inspection patient's wound bed actually showed signs of good granulation and epithelization at this point. Fortunately I do not see any evidence of active infection locally or systemically which is great news and overall I am extremely pleased with where we stand currently. No sharp debridement necessary. Electronic Signature(s) Signed: 12/18/2021 10:02:19 AM By: Worthy Keeler PA-C Entered By: Worthy Keeler on 12/18/2021 10:02:18 -------------------------------------------------------------------------------- Physician Orders Details Patient Name: Date of Service: [name] [name], [name] [name] 12/18/2021 9:15 A M Medical Record Number: [id number] Patient Account Number: [id number] Date of Birth/Sex: Treating RN:  Nov 08, 1924 (88 y.o. 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 - Wed. 01/01/22 @ 0915 w/ Toney Rakes Room # 9 Return A Other: - ****PLEASE BE SURE TO ONLY USE KERLIX AND COBAN FOR COMPRESSION WRAPS TO BOTH LEGS and START WRAPS AT BOTTOM OF TOES AND GO UP TO JUST BELOW THE BEND OF THE KNEE**** Registered Nurse to change legs dressings every other day and PRN soiled dressings!!! Bathing/ Shower/ Hygiene May shower with protection but do not get wound dressing(s) wet. Edema Control - Lymphedema / SCD / Other Elevate legs to the level of the heart or above for 30 minutes daily and/or when sitting, a frequency of: Avoid standing for long periods of time. Compression stocking or Garment 20-30 mm/Hg pressure to: - PT will need 20 30 compression stockings for both legs once wounds are healed. . ***FACILITY TO ORDER*** ***Begin using once both legs are healed*** Other Edema Control Orders/Instructions: - Kerlix and coban to both legs Wound Treatment Wound #4 - Ankle Wound Laterality: Right, Lateral Cleanser: Soap and Water Every Other Day/15 Days Discharge Instructions: May shower  and wash wound with dial antibacterial soap and water prior to dressing change. Cleanser: Wound Cleanser Every Other Day/15 Days Discharge Instructions: Cleanse the wound with wound cleanser prior to applying a clean dressing using gauze sponges, not tissue or cotton balls. Peri-Wound Care: Zinc Oxide Ointment 30g tube Every Other Day/15 Days Discharge Instructions: Apply Zinc Oxide to periwound with each dressing change Peri-Wound Care: Sween Lotion (Moisturizing lotion) Every Other Day/15 Days Discharge Instructions: Apply moisturizing lotion as directed Prim Dressing: Xeroform Occlusive Gauze Dressing, 4x4 in Every Other Day/15 Days ary Discharge Instructions: Apply to wound bed as instructed Secondary Dressing: ABD Pad, 5x9 Every Other Day/15 Days Discharge Instructions: Apply over primary dressing as directed. Compression Wrap: Kerlix Roll 4.5x3.1 (in/yd) Every Other Day/15 Days Discharge Instructions: Apply Kerlix and Coban compression as directed. Compression Wrap: Coban Self-Adherent Wrap 4x5 (in/yd) Every Other Day/15 Days Discharge Instructions: Apply over Kerlix as directed. Electronic Signature(s) Signed: 12/18/2021 4:22:37 PM By: Worthy Keeler PA-C Signed: 12/18/2021 4:25:42 PM By: Rhae Hammock RN Entered By: Rhae Hammock on 12/18/2021 10:01:58 -------------------------------------------------------------------------------- Problem List Details Patient Name: Date of Service: [name] [name], [name] [name] 12/18/2021 9:15 A M Medical Record Number: [id number] Patient Account Number: [id number] Date of Birth/Sex: Treating RN: 12-29-1924 (88 y.o. 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 12/18/2021 9:15 A M Medical Record Number: [id number] Patient Account Number: [id number] Date of Birth/Sex: Treating RN: 1925/05/13 (88 y.o. [name], [name] Primary Care Provider: [name] Other Clinician: Referring Provider: Treating Provider/Extender: [name] in Treatment: 9 Subjective Chief Complaint Information obtained from Patient Bilateral LE Ulcers History of Present Illness (HPI) ADMISSION 04/02/2021 This is a pleasant 88 year old man sent to our clinic by Dr. Lyndel Safe from the [facility] unit of [facility]. He has a wound on the right posterior calf. I am uncertain about the duration of this however it looks to be chronic. They are using Santyl covered with silver alginate he does not have any compression. The wound is 100% covered in very fibrinous adherent slough. It also appears that this is quite painful. The patient is exceptionally hard of hearing so it is difficult to get any additional history out  of him. Past medical history includes hypertension, coronary artery disease, congestive heart failure, BPH, seizure disorder. He is nonambulatory for reasons that are not totally clear. He lives at the [facility] unit of [facility]. ABI in our clinic was 1.03 on the right 11/2; 2-week follow-up. He is having the dressing changed at the facility. He arrives in the clinic with the area about the same in terms of surface area but better looking surface we have been using Iodoflex under 3 layer compression. Unfortunately we still do not have any history of exactly how long this is been there or how it happened. 11/16; 2-week follow-up. Nice improvement in the wound. He is using Iodoflex over 3 layer compression. 11/30 2-week follow-up. [facility] is at the [facility] [place]. We have been using Iodoflex under 3 layer compression. They are not wrapping his leg properly but nevertheless the wound area has come down nicely. The patient requests coming here every 3 weeks instead of every 2 12/21; the area on the posterior right calf is fully epithelialized. The patient has chronic venous insufficiency. Readmission: 10-16-2021 upon evaluation today patient appears to be doing somewhat poorly in regard to the wounds on the bilateral lower extremities. He has been tolerating the dressing changes without complication. Fortunately there does not appear to be any evidence of active infection locally or systemically which is great news. Overall I am extremely pleased with where things stand. With that being said that obviously he has quite a bit of swelling and this is led to several areas of significant blistering over his bilateral lower extremities all the wounds are superficial this is the good news. Patient continues to have issues with chronic venous hypertension bilaterally with ulcerations. He also has a history of hypertension and congestive heart failure. He previously tolerated 3 layer  compression wraps without complication. He was last seen and discharged in our office June 05, 2021. 10-23-2021 upon evaluation today patient appears to be doing excellent in regard to his legs. They do look better although there is a lot of  bright green drainage I do believe this likely represents a Pseudomonas infection and that was discussed with him as best I could today again he is extremely hard of hearing. I had to write most of what I wanted him to know down. 10-30-2021 upon evaluation 20 today patient appears to be doing much better in regard to his leg ulcers. He has been taking the Levaquin without complication and overall very pleased in that regard. Fortunately I do not see any evidence of infection locally or systemically which is great news. No fevers, chills, nausea, vomiting, or diarrhea. 11-06-2021 upon evaluation today patient appears to be doing excellent in regard to his wounds. In fact the left leg is almost completely healed the right leg is significantly smaller. Overall I do believe we are on the right track here. 6/7; 2-week follow-up. The patient's left leg is essentially closed. His superficial areas anteriorly look like they are doing very well with nice rims of epithelialization. UNFORTUNATELY he has a new wound on the right lateral malleolus the exact etiology of this is not really clear but he is mostly complaining of this wound. We have been using Xeroform under kerlix Coban The patient is in the [facility]. I am uncertain whether he has 20/30 below-knee stockings or not but he is definitely going to need them. 12-04-2021 upon evaluation today patient appears to be doing better in regard to his wound especially on the left side of the lower extremity although the right side he has a new blister healing of the other wounds are very close to complete closure. This is due to it not being wrapped appropriately from the knee down to the base  of the toes. This has been in the orders for the past several visits and to be honest I am not really sure why its not being done as ordered. 12-18-2021 upon evaluation today patient appears to be doing well currently in regard to his wounds. In fact he is showing signs of excellent improvement which is great news and in fact is only the right ankle which is still open. Overall everything else is doing awesome. I do not see any evidence of active infection locally or systemically at this time. Objective Constitutional Well-nourished and well-hydrated in no acute distress. Vitals Time Taken: 9:39 AM, Temperature: 97.9 F, Pulse: 58 bpm, Respiratory Rate: 18 breaths/min, Blood Pressure: 144/56 mmHg. Respiratory normal breathing without difficulty. Psychiatric this patient is able to make decisions and demonstrates good insight into disease process. Alert and Oriented x 3. pleasant and cooperative. General Notes: Upon inspection patient's wound bed actually showed signs of good granulation and epithelization at this point. Fortunately I do not see any evidence of active infection locally or systemically which is great news and overall I am extremely pleased with where we stand currently. No sharp debridement necessary. Integumentary (Hair, Skin) Wound #2 status is Open. Original cause of wound was Blister. The date acquired was: 09/14/2021. The wound has been in treatment 9 weeks. The wound is located on the Left,Circumferential Lower Leg. The wound measures 0cm length x 0cm width x 0cm depth; 0cm^2 area and 0cm^3 volume. There is a medium amount of serosanguineous drainage noted. The wound margin is distinct with the outline attached to the wound base. There is medium (34-66%) red, pink granulation within the wound bed. There is a medium (34-66%) amount of necrotic tissue within the wound bed including Adherent Slough. Wound #3 status is Open. Original cause  of wound was Blister. The date acquired  was: 09/14/2021. The wound has been in treatment 9 weeks. The wound is located on the Right,Anterior Lower Leg. The wound measures 0cm length x 0cm width x 0cm depth; 0cm^2 area and 0cm^3 volume. There is Fat Layer (Subcutaneous Tissue) exposed. There is a medium amount of serosanguineous drainage noted. The wound margin is distinct with the outline attached to the wound base. There is medium (34-66%) red, pink granulation within the wound bed. There is a medium (34-66%) amount of necrotic tissue within the wound bed including Adherent Slough. Wound #4 status is Open. Original cause of wound was Gradually Appeared. The date acquired was: 11/20/2021. The wound has been in treatment 4 weeks. The wound is located on the Right,Lateral Ankle. The wound measures 0.5cm length x 0.5cm width x 0.2cm depth; 0.196cm^2 area and 0.039cm^3 volume. There is no tunneling or undermining noted. There is a medium amount of serosanguineous drainage noted. The wound margin is distinct with the outline attached to the wound base. There is no granulation within the wound bed. There is a large (67-100%) amount of necrotic tissue within the wound bed including Eschar and Adherent Slough. Wound #5 status is Open. Original cause of wound was Blister. The date acquired was: 12/04/2021. The wound has been in treatment 2 weeks. The wound is located on the Right,Proximal,Anterior Lower Leg. The wound measures 0cm length x 0cm width x 0cm depth; 0cm^2 area and 0cm^3 volume. There is Fat Layer (Subcutaneous Tissue) exposed. There is a medium amount of serosanguineous drainage noted. The wound margin is distinct with the outline attached to the wound base. There is large (67-100%) red, pink granulation within the wound bed. There is a small (1-33%) amount of necrotic tissue within the wound bed including Adherent Slough. 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 - Wed. 01/01/22 @ 0915 w/ Margarita Grizzle and [facility] Room # 9 Other: - ****PLEASE BE SURE TO ONLY USE KERLIX AND COBAN FOR COMPRESSION WRAPS TO BOTH LEGS and START WRAPS AT BOTTOM OF TOES AND GO UP TO JUST BELOW THE BEND OF THE KNEE**** Registered Nurse to change legs dressings every other day and PRN soiled dressings!!! Bathing/ Shower/ Hygiene: May shower with protection but do not get wound dressing(s) wet. Edema Control - Lymphedema / SCD / Other: Elevate legs to the level of the heart or above for 30 minutes daily and/or when sitting, a frequency of: Avoid standing for long periods of time. Compression stocking or Garment 20-30 mm/Hg pressure to: - PT will need 20 30 compression stockings for both legs once wounds are healed. ***FACILITY . TO ORDER*** ***Begin using once both legs are healed*** Other Edema Control Orders/Instructions: - Kerlix and coban to both legs WOUND #4: - Ankle Wound Laterality: Right, Lateral Cleanser: Soap and Water Every Other Day/15 Days Discharge Instructions: May shower and wash wound with dial antibacterial soap and water prior to dressing change. Cleanser: Wound Cleanser Every Other Day/15 Days Discharge Instructions: Cleanse the wound with wound cleanser prior to applying  a clean dressing using gauze sponges, not tissue or cotton balls. Peri-Wound Care: Zinc Oxide Ointment 30g tube Every Other Day/15 Days Discharge Instructions: Apply Zinc Oxide to periwound  with each dressing change Peri-Wound Care: Sween Lotion (Moisturizing lotion) Every Other Day/15 Days Discharge Instructions: Apply moisturizing lotion as directed Prim Dressing: Xeroform Occlusive Gauze Dressing, 4x4 in Every Other Day/15 Days ary Discharge Instructions: Apply to wound bed as instructed Secondary Dressing: ABD Pad, 5x9 Every Other Day/15 Days Discharge Instructions: Apply over primary dressing as directed. Com pression Wrap: Kerlix Roll 4.5x3.1 (in/yd) Every Other Day/15 Days Discharge Instructions: Apply Kerlix and Coban compression as directed. Com pression Wrap: Coban Self-Adherent Wrap 4x5 (in/yd) Every Other Day/15 Days Discharge Instructions: Apply over Kerlix as directed. 1. Would recommend that we going continue with the wound care measures as before and the patient is in agreement with plan this includes the use of the Xeroform gauze dressing which I think is doing excellent. 2. Also can recommend that we continue with the Curlex and Coban wraps bilaterally which is doing well. 3. He does have impression socks which were ordered and they should have them on hand so that as soon as he heals he will be ready to have them placed and continue with compression therapy. We will see patient back for reevaluation in 2 weeks here in the clinic. If anything worsens or changes patient will contact our office for additional recommendations. Electronic Signature(s) Signed: 12/18/2021 10:02:51 AM By: Worthy Keeler PA-C Entered By: Worthy Keeler on 12/18/2021 10:02:51 -------------------------------------------------------------------------------- SuperBill Details Patient Name: Date of Service: [name] [name], [name] [name] 12/18/2021 Medical Record Number: [id number] Patient Account Number: [id number] Date of Birth/Sex: Treating RN: 02/01/1925 (88 y.o. 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 L97.812 Non-pressure chronic ulcer of other part of right lower leg with fat layer exposed I10 Essential (primary) hypertension I50.42 Chronic combined systolic (congestive) and diastolic (congestive) heart failure Facility Procedures CPT4 Code: [id number] Description: 99213 - WOUND CARE VISIT-LEV 3 EST PT Modifier: Quantity: 1 Physician Procedures : CPT4 Code Description Modifier [id number] - WC PHYS LEVEL 3 - EST PT ICD-10 Diagnosis Description I87.331 Chronic venous hypertension (idiopathic) with ulcer and inflammation of right lower extremity I87.332 Chronic venous hypertension (idiopathic)  with ulcer and inflammation of left lower extremity L97.822 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

## 2021-12-19 DIAGNOSIS — R1310 Dysphagia, unspecified: Secondary | ICD-10-CM | POA: Diagnosis not present

## 2021-12-19 DIAGNOSIS — R1312 Dysphagia, oropharyngeal phase: Secondary | ICD-10-CM | POA: Diagnosis not present

## 2021-12-19 DIAGNOSIS — I10 Essential (primary) hypertension: Secondary | ICD-10-CM | POA: Diagnosis not present

## 2021-12-19 DIAGNOSIS — E876 Hypokalemia: Secondary | ICD-10-CM | POA: Diagnosis not present

## 2021-12-19 DIAGNOSIS — R41841 Cognitive communication deficit: Secondary | ICD-10-CM | POA: Diagnosis not present

## 2021-12-19 LAB — CBC AND DIFFERENTIAL
HCT: 36 — AB (ref 41–53)
Hemoglobin: 12.5 — AB (ref 13.5–17.5)
Neutrophils Absolute: 4674
Platelets: 218 10*3/uL (ref 150–400)
WBC: 8.2

## 2021-12-19 LAB — CBC: RBC: 3.75 — AB (ref 3.87–5.11)

## 2021-12-19 NOTE — Progress Notes (Signed)
[name] [name] ([id number]) Visit Report for 12/18/2021 Arrival Information Details Patient Name: Date of Service: [name] [name], [name] [name] 12/18/2021 9:15 A M Medical Record Number: [id number] Patient Account Number: [id number] Date of Birth/Sex: Treating RN: [birth year] (88 y.o. [name] [name], [name] [name] Primary Care Avon Mergenthaler: [name] Other Clinician: Referring Ameisha Mcclellan: Treating Brody Kump/Extender: [name] in Treatment: 9 Visit Information History Since Last Visit Added or deleted any medications: No Patient Arrived: Wheel Chair Any new allergies or adverse reactions: No Arrival Time: 09:37 Had a fall or experienced change in No Accompanied By: self activities of daily living that may affect Transfer Assistance: None risk of falls: Patient Identification Verified: Yes Signs or symptoms of abuse/neglect since last visito No Secondary Verification Process Completed: Yes Hospitalized since last visit: No Patient Requires Transmission-Based Precautions: No Implantable device outside of the clinic excluding No Patient Has Alerts: Yes cellular tissue based products placed in the center Patient Alerts: ABI's 05/23 R: N/C since last visit: Has Dressing in Place as Prescribed: Yes Has Compression in Place as Prescribed: Yes Pain Present Now: No Electronic Signature(s) Signed: 12/19/2021 8:35:29 AM By: Erenest Blank Entered By: Erenest Blank on 12/18/2021 09:39:11 -------------------------------------------------------------------------------- Clinic Level of Care Assessment Details Patient Name: Date of Service: [name], [name] 12/18/2021 9:15 A M Medical Record Number: [id number] Patient Account Number: [id number] Date of Birth/Sex: Treating RN: [birth year] (88 y.o. [name] [name], [name] [name] Primary Care Klaus Casteneda: [name] Other Clinician: Referring Renda Pohlman: Treating Archer Moist/Extender: [name] in Treatment: 9 Clinic  Level of Care Assessment Items TOOL 4 Quantity Score X- 1 0 Use when only an EandM is performed on FOLLOW-UP visit ASSESSMENTS - Nursing Assessment / Reassessment X- 1 10 Reassessment of Co-morbidities (includes updates in patient status) X- 1 5 Reassessment of Adherence to Treatment Plan ASSESSMENTS - Wound and Skin A ssessment / Reassessment X - Simple Wound Assessment / Reassessment - one wound 1 5 '[]'$  - 0 Complex Wound Assessment / Reassessment - multiple wounds '[]'$  - 0 Dermatologic / Skin Assessment (not related to wound area) ASSESSMENTS - Focused Assessment X- 1 5 Circumferential Edema Measurements - multi extremities '[]'$  - 0 Nutritional Assessment / Counseling / Intervention '[]'$  - 0 Lower Extremity Assessment (monofilament, tuning fork, pulses) '[]'$  - 0 Peripheral Arterial Disease Assessment (using hand held doppler) ASSESSMENTS - Ostomy and/or Continence Assessment and Care '[]'$  - 0 Incontinence Assessment and Management '[]'$  - 0 Ostomy Care Assessment and Management (repouching, etc.) PROCESS - Coordination of Care X - Simple Patient / Family Education for ongoing care 1 15 '[]'$  - 0 Complex (extensive) Patient / Family Education for ongoing care X- 1 10 Staff obtains Consents, Records, T Results / Process Orders est X- 1 10 Staff telephones HHA, Nursing Homes / Clarify orders / etc '[]'$  - 0 Routine Transfer to another Facility (non-emergent condition) '[]'$  - 0 Routine Hospital Admission (non-emergent condition) '[]'$  - 0 New Admissions / Biomedical engineer / Ordering NPWT Apligraf, etc. , '[]'$  - 0 Emergency Hospital Admission (emergent condition) X- 1 10 Simple Discharge Coordination '[]'$  - 0 Complex (extensive) Discharge Coordination PROCESS - Special Needs '[]'$  - 0 Pediatric / Minor Patient Management '[]'$  - 0 Isolation Patient Management '[]'$  - 0 Hearing / Language / Visual special needs '[]'$  - 0 Assessment of Community assistance (transportation, D/C planning,  etc.) '[]'$  - 0 Additional assistance / Altered mentation '[]'$  - 0 Support Surface(s) Assessment (bed, cushion, seat, etc.) INTERVENTIONS - Wound Cleansing / Measurement X - Simple  Wound Cleansing - one wound 1 5 '[]'$  - 0 Complex Wound Cleansing - multiple wounds X- 1 5 Wound Imaging (photographs - any number of wounds) '[]'$  - 0 Wound Tracing (instead of photographs) X- 1 5 Simple Wound Measurement - one wound '[]'$  - 0 Complex Wound Measurement - multiple wounds INTERVENTIONS - Wound Dressings X - Small Wound Dressing one or multiple wounds 1 10 '[]'$  - 0 Medium Wound Dressing one or multiple wounds '[]'$  - 0 Large Wound Dressing one or multiple wounds '[]'$  - 0 Application of Medications - topical '[]'$  - 0 Application of Medications - injection INTERVENTIONS - Miscellaneous '[]'$  - 0 External ear exam '[]'$  - 0 Specimen Collection (cultures, biopsies, blood, body fluids, etc.) '[]'$  - 0 Specimen(s) / Culture(s) sent or taken to Lab for analysis '[]'$  - 0 Patient Transfer (multiple staff / Civil Service fast streamer / Similar devices) '[]'$  - 0 Simple Staple / Suture removal (25 or less) '[]'$  - 0 Complex Staple / Suture removal (26 or more) '[]'$  - 0 Hypo / Hyperglycemic Management (close monitor of Blood Glucose) '[]'$  - 0 Ankle / Brachial Index (ABI) - do not check if billed separately X- 1 5 Vital Signs Has the patient been seen at the hospital within the last three years: Yes Total Score: 100 Level Of Care: New/Established - Level 3 Electronic Signature(s) Signed: 12/18/2021 4:25:42 PM By: Rhae Hammock RN Entered By: Rhae Hammock on 12/18/2021 09:55:51 -------------------------------------------------------------------------------- Encounter Discharge Information Details Patient Name: Date of Service: [name] [name]. 12/18/2021 9:15 A M Medical Record Number: [id number] Patient Account Number: [id number] Date of Birth/Sex: Treating RN: 08/24/1924 (88 y.o. [name] [name], [name] [name] Primary Care Olivianna Higley:  [name] Other Clinician: Referring Stephaie Dardis: Treating Dhanya Bogle/Extender: [name] in Treatment: 9 Encounter Discharge Information Items Discharge Condition: Stable Ambulatory Status: Wheelchair Discharge Destination: Home Transportation: Private Auto Accompanied By: self Schedule Follow-up Appointment: Yes Clinical Summary of Care: Patient Declined Electronic Signature(s) Signed: 12/18/2021 4:25:42 PM By: Rhae Hammock RN Entered By: Rhae Hammock on 12/18/2021 10:04:44 -------------------------------------------------------------------------------- Lower Extremity Assessment Details Patient Name: Date of Service: [name] [name] 12/18/2021 9:15 A M Medical Record Number: [id number] Patient Account Number: [id number] Date of Birth/Sex: Treating RN: March 14, 1925 (88 y.o. [name] [name], [name] [name] Primary Care Phillis Thackeray: [name] Other Clinician: Referring Karys Meckley: Treating Savva Beamer/Extender: Patricia Nettle, Meredith Staggers Weeks in Treatment: 9 Edema Assessment Assessed: Shirlyn Goltz: Yes] Patrice Paradise: Yes] Edema: [Left: Yes] [Right: Yes] Calf Left: Right: Point of Measurement: 34 cm From Medial Instep 34.5 cm 35 cm Ankle Left: Right: Point of Measurement: 9 cm From Medial Instep 22 cm 22 cm Electronic Signature(s) Signed: 12/18/2021 4:25:42 PM By: Rhae Hammock RN Signed: 12/19/2021 8:35:29 AM By: Erenest Blank Entered By: Erenest Blank on 12/18/2021 09:45:51 -------------------------------------------------------------------------------- Multi Wound Chart Details Patient Name: Date of Service: [name] [name]. 12/18/2021 9:15 A M Medical Record Number: [id number] Patient Account Number: [id number] Date of Birth/Sex: Treating RN: 03/28/1925 (88 y.o. [name] [name], [name] [name] Primary Care Nicci Vaughan: [name] Other Clinician: Referring Justinian Miano: Treating Cephus Tupy/Extender: Patricia Nettle, Ihor Gully in Treatment: 9 Vital  Signs Height(in): Pulse(bpm): 30 Weight(lbs): Blood Pressure(mmHg): 144/56 Body Mass Index(BMI): Temperature(F): 97.9 Respiratory Rate(breaths/min): 18 Photos: [4:No Photos Right, Lateral Ankle] [5:No Photos Right, Proximal, Anterior Lower Leg] [N/A:N/A N/A] Wound Location: [4:Gradually Appeared] [5:Blister] [N/A:N/A] Wounding Event: [4:Venous Leg Ulcer] [5:Venous Leg Ulcer] [N/A:N/A] Primary Etiology: [4:11/20/2021] [5:12/04/2021] [N/A:N/A] Date Acquired: [4:4] [5:2] [N/A:N/A] Weeks of Treatment: [4:Open] [5:Open] [N/A:N/A] Wound Status: [4:No] [5:No] [N/A:N/A] Wound Recurrence: [  4:0.5x0.5x0.2] [5:0x0x0] [N/A:N/A] Measurements L x W x D (cm) [4:0.196] [5:0] [N/A:N/A] A (cm) : rea [4:0.039] [5:0] [N/A:N/A] Volume (cm) : [4:75.00%] [5:100.00%] [N/A:N/A] % Reduction in A rea: [4:50.60%] [5:100.00%] [N/A:N/A] % Reduction in Volume: [4:Full Thickness With Exposed Support] [5:Full Thickness With Exposed Support] [N/A:N/A] Classification: [4:Structures Medium] [5:Structures Medium] [N/A:N/A] Exudate Amount: [4:Serosanguineous] [5:Serosanguineous] [N/A:N/A] Exudate Type: [4:red, brown] [5:red, brown] [N/A:N/A] Treatment Notes Electronic Signature(s) Signed: 12/18/2021 9:47:03 AM By: Worthy Keeler PA-C Signed: 12/18/2021 4:25:42 PM By: Rhae Hammock RN Entered By: Worthy Keeler on 12/18/2021 09:47:03 -------------------------------------------------------------------------------- Multi-Disciplinary Care Plan Details Patient Name: Date of Service: [name] [name] 12/18/2021 9:15 A M Medical Record Number: [id number] Patient Account Number: [id number] Date of Birth/Sex: Treating RN: [birth year] (88 y.o. [name] [name], [name] [name] Primary Care Akari Crysler: [name] Other Clinician: Referring Torres Hardenbrook: Treating Nana Vastine/Extender: Patricia Nettle, Ihor Gully in Treatment: 9 Active Inactive Wound/Skin Impairment Nursing Diagnoses: Impaired tissue integrity Knowledge  deficit related to ulceration/compromised skin integrity Goals: Patient will have a decrease in wound volume by X% from date: (specify in notes) Date Initiated: 10/16/2021 Target Resolution Date: 01/10/2022 Goal Status: Active Patient/caregiver will verbalize understanding of skin care regimen Date Initiated: 10/16/2021 Target Resolution Date: 01/09/2022 Goal Status: Active Ulcer/skin breakdown will have a volume reduction of 30% by week 4 Date Initiated: 10/16/2021 Target Resolution Date: 01/08/2022 Goal Status: Active Interventions: Assess patient/caregiver ability to obtain necessary supplies Assess patient/caregiver ability to perform ulcer/skin care regimen upon admission and as needed Assess ulceration(s) every visit Notes: Electronic Signature(s) Signed: 12/18/2021 4:25:42 PM By: Rhae Hammock RN Entered By: Rhae Hammock on 12/18/2021 09:55:04 -------------------------------------------------------------------------------- Pain Assessment Details Patient Name: Date of Service: [name] [name] 12/18/2021 9:15 A M Medical Record Number: [id number] Patient Account Number: [id number] Date of Birth/Sex: Treating RN: [birth year] (88 y.o. [name] [name], [name] [name] Primary Care Tasneem Cormier: [name] Other Clinician: Referring Daivd Fredericksen: Treating Shaleta Ruacho/Extender: [name] in Treatment: 9 Active Problems Location of Pain Severity and Description of Pain Patient Has Paino No Site Locations Pain Management and Medication Current Pain Management: Electronic Signature(s) Signed: 12/18/2021 4:25:42 PM By: Rhae Hammock RN Signed: 12/19/2021 8:35:29 AM By: Erenest Blank Entered By: Erenest Blank on 12/18/2021 09:39:46 -------------------------------------------------------------------------------- Patient/Caregiver Education Details Patient Name: Date of Service: [name] [name] 7/5/2023andnbsp9:15 [place] Record Number:  [id number] Patient Account Number: [id number] Date of Birth/Gender: Treating RN: March 29, 1925 (88 y.o. Erie Noe Primary Care Physician: [name] Other Clinician: Referring Physician: Treating Physician/Extender: [name] in Treatment: 9 Education Assessment Education Provided To: Patient Education Topics Provided Wound/Skin Impairment: Methods: Explain/Verbal Responses: Reinforcements needed, State content correctly Electronic Signature(s) Signed: 12/18/2021 4:25:42 PM By: Rhae Hammock RN Entered By: Rhae Hammock on 12/18/2021 09:55:18 -------------------------------------------------------------------------------- Wound Assessment Details Patient Name: Date of Service: [name] [name] 12/18/2021 9:15 A M Medical Record Number: [id number] Patient Account Number: [id number] Date of Birth/Sex: Treating RN: 03-26-1925 (88 y.o. [name] [name], [name] [name] Primary Care Kayci Belleville: [name] Other Clinician: Referring Torunn Chancellor: Treating Makira Holleman/Extender: Patricia Nettle, Ihor Gully in Treatment: 9 Wound Status Wound Number: 2 Primary Venous Leg Ulcer Etiology: Wound Location: Left, Circumferential Lower Leg Wound Open Wounding Event: Blister Status: Date Acquired: 09/14/2021 Comorbid Arrhythmia, Congestive Heart Failure, Coronary Artery Disease, Weeks Of Treatment: 9 History: Hypertension, Peripheral Venous Disease, Osteoarthritis, Seizure Clustered Wound: Yes Disorder Photos Wound Measurements Length: (cm) Width: (cm) Depth: (cm) Clustered Quantity: Area: (cm) Volume: (cm) 0 % Reduction in Area: 100% 0 %  Reduction in Volume: 100% 0 Epithelialization: Small (1-33%) 1 0 0 Wound Description Classification: Full Thickness With Exposed Support Structures Wound Margin: Distinct, outline attached Exudate Amount: Medium Exudate Type: Serosanguineous Exudate Color: red, brown Foul Odor After Cleansing:  No Slough/Fibrino Yes Wound Bed Granulation Amount: Medium (34-66%) Exposed Structure Granulation Quality: Red, Pink Fascia Exposed: No Necrotic Amount: Medium (34-66%) Fat Layer (Subcutaneous Tissue) Exposed: No Necrotic Quality: Adherent Slough Tendon Exposed: No Muscle Exposed: No Joint Exposed: No Bone Exposed: No Electronic Signature(s) Signed: 12/18/2021 4:25:42 PM By: Rhae Hammock RN Entered By: Rhae Hammock on 12/18/2021 09:47:58 -------------------------------------------------------------------------------- Wound Assessment Details Patient Name: Date of Service: [name] [name] 12/18/2021 9:15 A M Medical Record Number: [id number] Patient Account Number: [id number] Date of Birth/Sex: Treating RN: [birth year] (88 y.o. [name] [name], [name] [name] Primary Care Sandra Brents: [name] Other Clinician: Referring Emrie Gayle: Treating Levorn Oleski/Extender: Patricia Nettle, Ihor Gully in Treatment: 9 Wound Status Wound Number: 3 Primary Venous Leg Ulcer Etiology: Wound Location: Right, Anterior Lower Leg Wound Open Wounding Event: Blister Status: Date Acquired: 09/14/2021 Comorbid Arrhythmia, Congestive Heart Failure, Coronary Artery Disease, Weeks Of Treatment: 9 History: Hypertension, Peripheral Venous Disease, Osteoarthritis, Seizure Clustered Wound: Yes Disorder Photos Wound Measurements Length: (cm) Width: (cm) Depth: (cm) Clustered Quantity: Area: (cm) Volume: (cm) 0 % Reduction in Area: 100% 0 % Reduction in Volume: 100% 0 Epithelialization: None 2 0 0 Wound Description Classification: Full Thickness With Exposed Support Structures Wound Margin: Distinct, outline attached Exudate Amount: Medium Exudate Type: Serosanguineous Exudate Color: red, brown Foul Odor After Cleansing: No Slough/Fibrino Yes Wound Bed Granulation Amount: Medium (34-66%) Exposed Structure Granulation Quality: Red, Pink Fascia Exposed: No Necrotic Amount: Medium  (34-66%) Fat Layer (Subcutaneous Tissue) Exposed: Yes Necrotic Quality: Adherent Slough Tendon Exposed: No Muscle Exposed: No Joint Exposed: No Bone Exposed: No Electronic Signature(s) Signed: 12/18/2021 4:25:42 PM By: Rhae Hammock RN Entered By: Rhae Hammock on 12/18/2021 09:48:17 -------------------------------------------------------------------------------- Wound Assessment Details Patient Name: Date of Service: [name] [name] 12/18/2021 9:15 A M Medical Record Number: [id number] Patient Account Number: [id number] Date of Birth/Sex: Treating RN: [birth year] (88 y.o. [name] [name], [name] [name] Primary Care Mady Oubre: [name] Other Clinician: Referring Genesia Caslin: Treating Zemira Zehring/Extender: Patricia Nettle, Ihor Gully in Treatment: 9 Wound Status Wound Number: 4 Primary Venous Leg Ulcer Etiology: Wound Location: Right, Lateral Ankle Wound Open Wounding Event: Gradually Appeared Status: Date Acquired: 11/20/2021 Comorbid Arrhythmia, Congestive Heart Failure, Coronary Artery Disease, Weeks Of Treatment: 4 History: Hypertension, Peripheral Venous Disease, Osteoarthritis, Seizure Clustered Wound: No Disorder Photos Wound Measurements Length: (cm) 0.5 Width: (cm) 0.5 Depth: (cm) 0.2 Area: (cm) 0.196 Volume: (cm) 0.039 % Reduction in Area: 75% % Reduction in Volume: 50.6% Epithelialization: None Tunneling: No Undermining: No Wound Description Classification: Full Thickness With Exposed Support Structures Wound Margin: Distinct, outline attached Exudate Amount: Medium Exudate Type: Serosanguineous Exudate Color: red, brown Foul Odor After Cleansing: No Slough/Fibrino Yes Wound Bed Granulation Amount: None Present (0%) Necrotic Amount: Large (67-100%) Necrotic Quality: Eschar, Adherent Slough Treatment Notes Wound #4 (Ankle) Wound Laterality: Right, Lateral Cleanser Soap and Water Discharge Instruction: May shower and wash wound with dial  antibacterial soap and water prior to dressing change. Wound Cleanser Discharge Instruction: Cleanse the wound with wound cleanser prior to applying a clean dressing using gauze sponges, not tissue or cotton balls. Peri-Wound Care Zinc Oxide Ointment 30g tube Discharge Instruction: Apply Zinc Oxide to periwound with each dressing change Sween Lotion (Moisturizing lotion) Discharge Instruction: Apply moisturizing lotion as directed Topical Primary Dressing  Xeroform Occlusive Gauze Dressing, 4x4 in Discharge Instruction: Apply to wound bed as instructed Secondary Dressing ABD Pad, 5x9 Discharge Instruction: Apply over primary dressing as directed. Secured With Compression Wrap Kerlix Roll 4.5x3.1 (in/yd) Discharge Instruction: Apply Kerlix and Coban compression as directed. Coban Self-Adherent Wrap 4x5 (in/yd) Discharge Instruction: Apply over Kerlix as directed. Compression Stockings Add-Ons Electronic Signature(s) Signed: 12/18/2021 4:25:42 PM By: Rhae Hammock RN Entered By: Rhae Hammock on 12/18/2021 09:48:35 -------------------------------------------------------------------------------- Wound Assessment Details Patient Name: Date of Service: [name] [name] 12/18/2021 9:15 A M Medical Record Number: [id number] Patient Account Number: [id number] Date of Birth/Sex: Treating RN: [birth year] (88 y.o. [name] [name], [name] [name] Primary Care Sheva Mcdougle: [name] Other Clinician: Referring Bilan Tedesco: Treating Warda Mcqueary/Extender: Patricia Nettle, Ihor Gully in Treatment: 9 Wound Status Wound Number: 5 Primary Venous Leg Ulcer Etiology: Wound Location: Right, Proximal, Anterior Lower Leg Wound Open Wounding Event: Blister Status: Date Acquired: 12/04/2021 Comorbid Arrhythmia, Congestive Heart Failure, Coronary Artery Disease, Weeks Of Treatment: 2 History: Hypertension, Peripheral Venous Disease, Osteoarthritis, Seizure Clustered Wound: No  Disorder Photos Wound Measurements Length: (cm) Width: (cm) Depth: (cm) Area: (cm) Volume: (cm) 0 % Reduction in Area: 100% 0 % Reduction in Volume: 100% 0 Epithelialization: None 0 0 Wound Description Classification: Full Thickness With Exposed Support Structures Wound Margin: Distinct, outline attached Exudate Amount: Medium Exudate Type: Serosanguineous Exudate Color: red, brown Foul Odor After Cleansing: No Slough/Fibrino Yes Wound Bed Granulation Amount: Large (67-100%) Exposed Structure Granulation Quality: Red, Pink Fascia Exposed: No Necrotic Amount: Small (1-33%) Fat Layer (Subcutaneous Tissue) Exposed: Yes Necrotic Quality: Adherent Slough Tendon Exposed: No Muscle Exposed: No Joint Exposed: No Bone Exposed: No Electronic Signature(s) Signed: 12/18/2021 4:25:42 PM By: Rhae Hammock RN Entered By: Rhae Hammock on 12/18/2021 09:48:54 -------------------------------------------------------------------------------- Vitals Details Patient Name: Date of Service: [name] [name]. 12/18/2021 9:15 A M Medical Record Number: [id number] Patient Account Number: [id number] Date of Birth/Sex: Treating RN: Nov 28, 1924 (88 y.o. [name] [name], [name] [name] Primary Care Nicolaas Savo: [name] Other Clinician: Referring Elzora Cullins: Treating Tanaysha Alkins/Extender: Patricia Nettle, Ihor Gully in Treatment: 9 Vital Signs Time Taken: 09:39 Temperature (F): 97.9 Pulse (bpm): 58 Respiratory Rate (breaths/min): 18 Blood Pressure (mmHg): 144/56 Reference Range: 80 - 120 mg / dl Electronic Signature(s) Signed: 12/19/2021 8:35:29 AM By: Erenest Blank Entered By: Erenest Blank on 12/18/2021 09:39:35

## 2022-01-01 ENCOUNTER — Encounter (HOSPITAL_BASED_OUTPATIENT_CLINIC_OR_DEPARTMENT_OTHER): Payer: Medicare HMO | Admitting: General Surgery

## 2022-01-01 DIAGNOSIS — L97822 Non-pressure chronic ulcer of other part of left lower leg with fat layer exposed: Secondary | ICD-10-CM | POA: Diagnosis not present

## 2022-01-01 DIAGNOSIS — I87333 Chronic venous hypertension (idiopathic) with ulcer and inflammation of bilateral lower extremity: Secondary | ICD-10-CM | POA: Diagnosis not present

## 2022-01-01 DIAGNOSIS — I110 Hypertensive heart disease with heart failure: Secondary | ICD-10-CM | POA: Diagnosis not present

## 2022-01-01 DIAGNOSIS — L97312 Non-pressure chronic ulcer of right ankle with fat layer exposed: Secondary | ICD-10-CM | POA: Diagnosis not present

## 2022-01-01 DIAGNOSIS — G40909 Epilepsy, unspecified, not intractable, without status epilepticus: Secondary | ICD-10-CM | POA: Diagnosis not present

## 2022-01-01 DIAGNOSIS — I2510 Atherosclerotic heart disease of native coronary artery without angina pectoris: Secondary | ICD-10-CM | POA: Diagnosis not present

## 2022-01-01 DIAGNOSIS — N400 Enlarged prostate without lower urinary tract symptoms: Secondary | ICD-10-CM | POA: Diagnosis not present

## 2022-01-01 DIAGNOSIS — L97812 Non-pressure chronic ulcer of other part of right lower leg with fat layer exposed: Secondary | ICD-10-CM | POA: Diagnosis not present

## 2022-01-01 DIAGNOSIS — I87331 Chronic venous hypertension (idiopathic) with ulcer and inflammation of right lower extremity: Secondary | ICD-10-CM | POA: Diagnosis not present

## 2022-01-01 DIAGNOSIS — I5042 Chronic combined systolic (congestive) and diastolic (congestive) heart failure: Secondary | ICD-10-CM | POA: Diagnosis not present

## 2022-01-01 DIAGNOSIS — L97318 Non-pressure chronic ulcer of right ankle with other specified severity: Secondary | ICD-10-CM | POA: Diagnosis not present

## 2022-01-01 NOTE — Progress Notes (Addendum)
[name], [name] ([id number]) Visit Report for 01/01/2022 Chief Complaint Document Details Patient Name: Date of Service: [name] [name], [name] [name] 01/01/2022 9:15 A M Medical Record Number: [id number] Patient Account Number: [id number] Date of Birth/Sex: Treating RN: May 13, 1925 (88 y.o. [name], [name] Primary Care Provider: [name] Other Clinician: Referring Provider: Treating Provider/Extender: [name] in Treatment: 11 Information Obtained from: Patient Chief Complaint Bilateral LE Ulcers Electronic Signature(s) Signed: 01/01/2022 9:29:48 AM By: Worthy Keeler PA-C Previous Signature: 01/01/2022 9:29:19 AM Version By: Worthy Keeler PA-C Entered By: Worthy Keeler on 01/01/2022 09:29:47 -------------------------------------------------------------------------------- HPI Details Patient Name: Date of Service: [name] [name]. 01/01/2022 9:15 A M Medical Record Number: [id number] Patient Account Number: [id number] Date of Birth/Sex: Treating RN: 1925-06-14 (88 y.o. [name], [name] Primary Care Provider: [name] Other Clinician: Referring Provider: Treating Provider/Extender: [name] in Treatment: 11 History of Present Illness HPI Description: ADMISSION 04/02/2021 This is a pleasant 88 year old man sent to our clinic by Dr. Lyndel Safe from the skilled unit of [facility]. He has a wound on the right posterior calf. I am uncertain about the duration of this however it looks to be chronic. They are using Santyl covered with silver alginate he does not have any compression. The wound is 100% covered in very fibrinous adherent slough. It also appears that this is quite painful. The patient is exceptionally hard of hearing so it is difficult to get any additional history out of him. Past medical history includes hypertension, coronary artery disease, congestive heart failure, BPH, seizure disorder. He  is nonambulatory for reasons that are not totally clear. He lives at the skilled unit of [facility]. ABI in our clinic was 1.03 on the right 11/2; 2-week follow-up. He is having the dressing changed at the facility. He arrives in the clinic with the area about the same in terms of surface area but better looking surface we have been using Iodoflex under 3 layer compression. Unfortunately we still do not have any history of exactly how long this is been there or how it happened. 11/16; 2-week follow-up. Nice improvement in the wound. He is using Iodoflex over 3 layer compression. 11/30 2-week follow-up. [facility] [place]. We have been using Iodoflex under 3 layer compression. They are not wrapping his leg properly but nevertheless the wound area has come down nicely. The patient requests coming here every 3 weeks instead of every 2 12/21; the area on the posterior right calf is fully epithelialized. The patient has chronic venous insufficiency. Readmission: 10-16-2021 upon evaluation today patient appears to be doing somewhat poorly in regard to the wounds on the bilateral lower extremities. He has been tolerating the dressing changes without complication. Fortunately there does not appear to be any evidence of active infection locally or systemically which is great news. Overall I am extremely pleased with where things stand. With that being said that obviously he has quite a bit of swelling and this is led to several areas of significant blistering over his bilateral lower extremities all the wounds are superficial this is the good news. Patient continues to have issues with chronic venous hypertension bilaterally with ulcerations. He also has a history of hypertension and congestive heart failure. He previously tolerated 3 layer compression wraps without complication. He was last seen and discharged in our office June 05, 2021. 10-23-2021 upon evaluation  today patient appears to be doing  excellent in regard to his legs. They do look better although there is a lot of bright green drainage I do believe this likely represents a Pseudomonas infection and that was discussed with him as best I could today again he is extremely hard of hearing. I had to write most of what I wanted him to know down. 10-30-2021 upon evaluation 20 today patient appears to be doing much better in regard to his leg ulcers. He has been taking the Levaquin without complication and overall very pleased in that regard. Fortunately I do not see any evidence of infection locally or systemically which is great news. No fevers, chills, nausea, vomiting, or diarrhea. 11-06-2021 upon evaluation today patient appears to be doing excellent in regard to his wounds. In fact the left leg is almost completely healed the right leg is significantly smaller. Overall I do believe we are on the right track here. 6/7; 2-week follow-up. The patient's left leg is essentially closed. His superficial areas anteriorly look like they are doing very well with nice rims of epithelialization. UNFORTUNATELY he has a new wound on the right lateral malleolus the exact etiology of this is not really clear but he is mostly complaining of this wound. We have been using Xeroform under kerlix Coban The patient is in the [facility]. I am uncertain whether he has 20/30 below-knee stockings or not but he is definitely going to need them. 12-04-2021 upon evaluation today patient appears to be doing better in regard to his wound especially on the left side of the lower extremity although the right side he has a new blister healing of the other wounds are very close to complete closure. This is due to it not being wrapped appropriately from the knee down to the base of the toes. This has been in the orders for the past several visits and to be honest I am not really sure why its not being done  as ordered. 12-18-2021 upon evaluation today patient appears to be doing well currently in regard to his wounds. In fact he is showing signs of excellent improvement which is great news and in fact is only the right ankle which is still open. Overall everything else is doing awesome. I do not see any evidence of active infection locally or systemically at this time. 01-01-2022 upon evaluation today patient appears to be doing well with regard to his wound. He is overall showing signs of excellent improvement which is great news. Fortunately I do not see any evidence of active infection locally or systemically at this time. Electronic Signature(s) Signed: 01/01/2022 10:22:25 AM By: Worthy Keeler PA-C Previous Signature: 01/01/2022 9:41:54 AM Version By: Worthy Keeler PA-C Entered By: Worthy Keeler on 01/01/2022 10:22:25 -------------------------------------------------------------------------------- Physical Exam Details Patient Name: Date of Service: EMET, RAFANAN 01/01/2022 9:15 A M Medical Record Number: [id number] Patient Account Number: [id number] Date of Birth/Sex: Treating RN: 18-Aug-1924 (88 y.o. [name], [name] Primary Care Provider: [name] Other Clinician: Referring Provider: Treating Provider/Extender: Patricia Nettle, Ihor Gully in Treatment: 53 Constitutional Well-nourished and well-hydrated in no acute distress. Respiratory normal breathing without difficulty. Psychiatric this patient is able to make decisions and demonstrates good insight into disease process. Alert and Oriented x 3. pleasant and cooperative. Notes Patient's wound bed showed evidence of good granulation and epithelization at this point. Fortunately I do not see Any evidence of infection locally or systemically which is great news and overall I  am extremely pleased with where things stand. I do not see any signs of active infection at this point. Electronic Signature(s) Signed:  01/01/2022 10:22:49 AM By: Worthy Keeler PA-C Entered By: Worthy Keeler on 01/01/2022 10:22:49 -------------------------------------------------------------------------------- Physician Orders Details Patient Name: Date of Service: CAESON, FILIPPI 01/01/2022 9:15 A M Medical Record Number: [id number] Patient Account Number: [id number] Date of Birth/Sex: Treating RN: [birth year] (88 y.o. 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 - Wed. 01/15/22 @ 1000 w/ Toney Rakes Room # 9 Return A Other: - ****Facility to begin using compression stocking to left leg. Apply in the morning and remove at night**** ****Tubi grips to the right leg. It may be used for 1 week. I have provided another tubi grip and sent it in the envelope**** Bathing/ Shower/ Hygiene May shower with protection but do not get wound dressing(s) wet. Edema Control - Lymphedema / SCD / Other Elevate legs to the level of the heart or above for 30 minutes daily and/or when sitting, a frequency of: Avoid standing for long periods of time. Compression stocking or Garment 20-30 mm/Hg  pressure to: - Begin using on LEFT LEG Wound Treatment Wound #4 - Ankle Wound Laterality: Right, Lateral Cleanser: Soap and Water Every Other Day/15 Days Discharge Instructions: May shower and wash wound with dial antibacterial soap and water prior to dressing change. Cleanser: Wound Cleanser Every Other Day/15 Days Discharge Instructions: Cleanse the wound with wound cleanser prior to applying a clean dressing using gauze sponges, not tissue or cotton balls. Peri-Wound Care: Zinc Oxide Ointment 30g tube Every Other Day/15 Days Discharge Instructions: Apply Zinc Oxide to periwound with each dressing change Peri-Wound Care: Sween Lotion (Moisturizing lotion) Every Other Day/15 Days Discharge Instructions: Apply moisturizing lotion as directed Prim Dressing: Xeroform Occlusive Gauze Dressing, 4x4 in Every Other Day/15 Days ary Discharge Instructions: Apply to wound bed as instructed Secondary Dressing: Bordered Gauze, 4x4 in Every Other Day/15 Days Discharge Instructions: Apply over primary dressing as directed. Compression Wrap: Tubi grips size D Every Other Day/15 Days Electronic Signature(s) Signed: 01/01/2022 3:34:17 PM By: Rhae Hammock RN Signed: 01/01/2022 3:46:42 PM By: Worthy Keeler PA-C Entered By: Rhae Hammock on 01/01/2022 10:30:08 -------------------------------------------------------------------------------- Problem List Details Patient Name: Date of Service: AMEIR, FARIA 01/01/2022 9:15 A M Medical Record Number: [id number] Patient Account Number: [id number] Date of Birth/Sex: Treating RN: [birth year] (88 y.o. 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 01/01/2022 9:15 A M Medical Record Number: [id number] Patient Account Number: [id number] Date of Birth/Sex: Treating RN: 1925/01/27 (88 y.o. [name], [name] Primary Care Provider: [name] Other Clinician: Referring Provider: Treating Provider/Extender: [name] in Treatment: 11 Subjective Chief Complaint Information obtained from Patient Bilateral LE Ulcers History of Present Illness (HPI) ADMISSION 04/02/2021 This is a pleasant 88 year old man sent to our clinic by Dr. Lyndel Safe from the skilled unit of [facility]. He has a wound on the right posterior calf. I am uncertain about the duration of this however it looks to be chronic. They are using Santyl covered with silver alginate he does not have any compression. The wound is 100% covered in very fibrinous adherent slough. It also appears that this is quite painful. The patient is exceptionally hard of hearing so it is difficult to get any additional history out of  him. Past medical history includes hypertension, coronary artery disease, congestive heart failure, BPH, seizure disorder. He is nonambulatory for reasons that are not totally clear. He lives at the [facility] unit of [facility]. ABI in our clinic was 1.03 on the right 11/2; 2-week follow-up. He is having the dressing changed at the facility. He arrives in the clinic with the area about the same in terms of surface area but better looking surface we have been using Iodoflex under 3 layer compression. Unfortunately we still do not have any history of exactly how long this is been there or how it happened. 11/16; 2-week follow-up. Nice improvement in the wound. He is using Iodoflex over 3 layer compression. 11/30 2-week follow-up. [facility] is at the [facility] [place]. We have been using Iodoflex under 3 layer compression. They are not wrapping his leg properly but nevertheless the wound area has come down nicely. The patient requests coming here every 3 weeks instead of every 2 12/21; the area on the posterior right calf is fully epithelialized. The patient has chronic venous insufficiency. Readmission: 10-16-2021 upon evaluation today patient appears to be doing somewhat poorly in regard to the wounds on the bilateral lower extremities. He has been tolerating the dressing changes without complication. Fortunately there does not appear to be any evidence of active infection locally or systemically which is great news. Overall I am extremely pleased with where things stand. With that being said that obviously he has quite a bit of swelling and this is led to several areas of significant blistering over his bilateral lower extremities all the wounds are superficial this is the good news. Patient continues to have issues with chronic venous hypertension bilaterally with ulcerations. He also has a history of hypertension and congestive heart failure. He previously tolerated 3 layer  compression wraps without complication. He was last seen and discharged in our office June 05, 2021. 10-23-2021 upon evaluation today patient appears to be doing excellent in regard to his legs. They do look  better although there is a lot of bright green drainage I do believe this likely represents a Pseudomonas infection and that was discussed with him as best I could today again he is extremely hard of hearing. I had to write most of what I wanted him to know down. 10-30-2021 upon evaluation 20 today patient appears to be doing much better in regard to his leg ulcers. He has been taking the Levaquin without complication and overall very pleased in that regard. Fortunately I do not see any evidence of infection locally or systemically which is great news. No fevers, chills, nausea, vomiting, or diarrhea. 11-06-2021 upon evaluation today patient appears to be doing excellent in regard to his wounds. In fact the left leg is almost completely healed the right leg is significantly smaller. Overall I do believe we are on the right track here. 6/7; 2-week follow-up. The patient's left leg is essentially closed. His superficial areas anteriorly look like they are doing very well with nice rims of epithelialization. UNFORTUNATELY he has a new wound on the right lateral malleolus the exact etiology of this is not really clear but he is mostly complaining of this wound. We have been using Xeroform under kerlix Coban The patient is in the [facility]. I am uncertain whether he has 20/30 below-knee stockings or not but he is definitely going to need them. 12-04-2021 upon evaluation today patient appears to be doing better in regard to his wound especially on the left side of the lower extremity although the right side he has a new blister healing of the other wounds are very close to complete closure. This is due to it not being wrapped appropriately from the knee down to the base  of the toes. This has been in the orders for the past several visits and to be honest I am not really sure why its not being done as ordered. 12-18-2021 upon evaluation today patient appears to be doing well currently in regard to his wounds. In fact he is showing signs of excellent improvement which is great news and in fact is only the right ankle which is still open. Overall everything else is doing awesome. I do not see any evidence of active infection locally or systemically at this time. 01-01-2022 upon evaluation today patient appears to be doing well with regard to his wound. He is overall showing signs of excellent improvement which is great news. Fortunately I do not see any evidence of active infection locally or systemically at this time. Objective Constitutional Well-nourished and well-hydrated in no acute distress. Vitals Time Taken: 9:24 AM, Temperature: 98 F, Pulse: 67 bpm, Respiratory Rate: 18 breaths/min, Blood Pressure: 144/67 mmHg. Respiratory normal breathing without difficulty. Psychiatric this patient is able to make decisions and demonstrates good insight into disease process. Alert and Oriented x 3. pleasant and cooperative. General Notes: Patient's wound bed showed evidence of good granulation and epithelization at this point. Fortunately I do not see Any evidence of infection locally or systemically which is great news and overall I am extremely pleased with where things stand. I do not see any signs of active infection at this point. Integumentary (Hair, Skin) Wound #4 status is Open. Original cause of wound was Gradually Appeared. The date acquired was: 11/20/2021. The wound has been in treatment 6 weeks. The wound is located on the Right,Lateral Ankle. The wound measures 0.5cm length x 0.7cm width x 0.2cm depth; 0.275cm^2 area and 0.055cm^3 volume. There is no  tunneling or undermining noted. There is a medium amount of serosanguineous drainage noted. The wound margin is  distinct with the outline attached to the wound base. There is no granulation within the wound bed. There is a large (67-100%) amount of necrotic tissue within the wound bed including Eschar and Adherent Slough. 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 - Wed. 01/15/22 @ 1000 w/ Toney Rakes Room # 9 Other: - ****PLEASE BE SURE TO ONLY USE KERLIX AND COBAN FOR COMPRESSION WRAPS TO BOTH LEGS and START WRAPS AT BOTTOM OF TOES AND GO UP TO JUST BELOW THE BEND OF THE KNEE**** Registered Nurse to change legs dressings every other day and PRN soiled dressings!!! Bathing/ Shower/ Hygiene: May shower with protection but do not get wound dressing(s) wet. Edema Control - Lymphedema / SCD / Other: Elevate legs to the level of the heart or above for 30 minutes daily and/or when sitting, a frequency of: Avoid standing for long periods of time. Compression stocking or Garment 20-30 mm/Hg pressure to: - PT will need 20 30 compression stockings for both legs once wounds are healed. ***FACILITY . TO ORDER*** ***Begin using once both legs are healed*** Other Edema Control Orders/Instructions: - Kerlix and coban to both legs WOUND #4: - Ankle Wound Laterality: Right, Lateral Cleanser: Soap and Water Every Other Day/15 Days Discharge Instructions: May shower and wash wound with dial antibacterial soap and water prior to  dressing change. Cleanser: Wound Cleanser Every Other Day/15 Days Discharge Instructions: Cleanse the wound with wound cleanser prior to applying a clean dressing using gauze sponges, not tissue or cotton balls. Peri-Wound Care: Zinc Oxide Ointment 30g tube Every Other Day/15 Days Discharge Instructions: Apply Zinc Oxide to periwound with each dressing change Peri-Wound Care: Sween Lotion (Moisturizing lotion) Every Other Day/15 Days Discharge Instructions: Apply moisturizing lotion as directed Prim Dressing: Xeroform Occlusive Gauze Dressing, 4x4 in Every Other Day/15 Days ary Discharge Instructions: Apply to wound bed as instructed Secondary Dressing: ABD Pad, 5x9 Every Other Day/15 Days Discharge Instructions: Apply over primary dressing as directed. Com pression Wrap: Kerlix Roll 4.5x3.1 (in/yd) Every Other Day/15 Days Discharge Instructions: Apply Kerlix and Coban compression as directed. Com pression Wrap: Coban Self-Adherent Wrap 4x5 (in/yd) Every Other Day/15 Days Discharge Instructions: Apply over Kerlix as directed. 1. I would recommend that we do make a change using Tubigrip for the right leg and on the left having him use his compression sock which is at the facility I think this is going to be the best way to go over the next 2 weeks. Unfortunately with the wrap and with the Curlex and Coban wrap they are not going high enough which is causing some issues here with swelling at the top of his legs I am afraid this is going to end up causing some issues. 2. Also I am going to recommend that we have the patient continue with the Xeroform followed by ABD pad and roll gauze to secure in place. 3. I would also suggest the patient continue to monitor  for any signs of worsening or infection if anything changes they should let me know from the facility. We will see patient back for reevaluation in 1 week here in the clinic. If anything worsens or changes patient will contact our office for  additional recommendations. Electronic Signature(s) Signed: 01/01/2022 10:23:39 AM By: Worthy Keeler PA-C Entered By: Worthy Keeler on 01/01/2022 10:23:39 -------------------------------------------------------------------------------- SuperBill Details Patient Name: Date of Service: [name] [name] 01/01/2022 Medical Record Number: [id number] Patient Account Number: [id number] Date of Birth/Sex: Treating RN: 1925-03-18 (88 y.o. 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 L97.812 Non-pressure chronic ulcer of other part of right lower leg with fat layer exposed I10 Essential (primary) hypertension I50.42 Chronic combined systolic (congestive) and diastolic (congestive) heart failure Facility Procedures CPT4 Code: [id number] Description: 99214 - WOUND CARE VISIT-LEV 4 EST PT Modifier: Quantity: 1 Physician Procedures : CPT4 Code Description Modifier [id number] - WC PHYS LEVEL 3 - EST PT ICD-10 Diagnosis Description I87.331 Chronic venous hypertension (idiopathic) with ulcer and inflammation of right lower extremity I87.332 Chronic venous hypertension (idiopathic)  with ulcer and inflammation of left lower extremity 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

## 2022-01-01 NOTE — Progress Notes (Signed)
[name] [name] ([id number]) Visit Report for 01/01/2022 Arrival Information Details Patient Name: Date of Service: [name] [name], [name] [name] 01/01/2022 9:15 A M Medical Record Number: [id number] Patient Account Number: [id number] Date of Birth/Sex: Treating [name]: July 10, 1924 (88 y.o. [name] [name] Primary Care [name] [name]: [name] [name] Other Clinician: Referring [name] [name]: Treating [name] [name]/Extender: [name] [name] in Treatment: 11 Visit Information History Since Last Visit Added or deleted any medications: No Patient Arrived: [name] [name] Any new allergies or adverse reactions: No Arrival Time: 09:22 Had a fall or experienced change in No Accompanied By: self activities of daily living that may affect Transfer Assistance: None risk of falls: Patient Identification Verified: Yes Signs or symptoms of abuse/neglect since last visito No Secondary Verification Process Completed: Yes Hospitalized since last visit: No Patient Requires Transmission-Based Precautions: No Implantable device outside of the clinic excluding No Patient Has Alerts: Yes cellular tissue based products placed in the center Patient Alerts: ABI's 05/23 R: N/C since last visit: Has Dressing in Place as Prescribed: Yes Has Compression in Place as Prescribed: Yes Pain Present Now: No Electronic Signature(s) Signed: 01/01/2022 3:44:56 PM By: [name] [name] Entered By: [name] [name] 01/01/2022 9:15 A M Medical Record Number: [id number] Patient Account Number: [id number] Date of Birth/Sex: Treating [name]: June 18, 1924 (88 y.o. [name] [name] Primary Care [name] [name]: [name] [name] Other Clinician: Referring Corday Wyka: Treating Promise Bushong/Extender: [name] [name] in Treatment: 11 Clinic  Level of Care Assessment Items TOOL 4 Quantity Score X- 1 0 Use when only an EandM is performed on FOLLOW-UP visit ASSESSMENTS - Nursing Assessment / Reassessment X- 1 10 Reassessment of Co-morbidities (includes updates in patient status) X- 1 5 Reassessment of Adherence to Treatment Plan ASSESSMENTS - Wound and Skin A ssessment / Reassessment '[]'$  - 0 Simple Wound Assessment / Reassessment - one wound X- 2 5 Complex Wound Assessment / Reassessment - multiple wounds '[]'$  - 0 Dermatologic / Skin Assessment (not related to wound area) ASSESSMENTS - Focused Assessment X- 2 5 Circumferential Edema Measurements - multi extremities '[]'$  - 0 Nutritional Assessment / Counseling / Intervention '[]'$  - 0 Lower Extremity Assessment (monofilament, tuning fork, pulses) '[]'$  - 0 Peripheral Arterial Disease Assessment (using hand held doppler) ASSESSMENTS - Ostomy and/or Continence Assessment and Care '[]'$  - 0 Incontinence Assessment and Management '[]'$  - 0 Ostomy Care Assessment and Management (repouching, etc.) PROCESS - Coordination of Care '[]'$  - 0 Simple Patient / Family Education for ongoing care X- 1 20 Complex (extensive) Patient / Family Education for ongoing care X- 1 10 Staff obtains Programmer, systems, Records, T Results / Process Orders est X- 1 10 Staff telephones HHA, Nursing Homes / Clarify orders / etc '[]'$  - 0 Routine Transfer to another Facility (non-emergent condition) '[]'$  - 0 Routine Hospital Admission (non-emergent condition) '[]'$  - 0 New Admissions / Biomedical engineer / Ordering NPWT Apligraf, etc. , '[]'$  - 0 Emergency Hospital Admission (emergent condition) '[]'$  - 0 Simple Discharge Coordination X- 1 15 Complex (extensive) Discharge Coordination PROCESS - Special Needs '[]'$  - 0 Pediatric / Minor Patient Management '[]'$  - 0 Isolation Patient Management '[]'$  - 0 Hearing / Language / Visual special needs '[]'$  - 0 Assessment of Community assistance (transportation, D/C planning,  etc.) '[]'$  - 0 Additional assistance / Altered mentation '[]'$  - 0 Support Surface(s) Assessment (bed, cushion, seat, etc.) INTERVENTIONS - Wound Cleansing / Measurement '[]'$  - 0 Simple Wound Cleansing -  one wound X- 2 5 Complex Wound Cleansing - multiple wounds X- 1 5 Wound Imaging (photographs - any number of wounds) '[]'$  - 0 Wound Tracing (instead of photographs) '[]'$  - 0 Simple Wound Measurement - one wound X- 2 5 Complex Wound Measurement - multiple wounds INTERVENTIONS - Wound Dressings '[]'$  - 0 Small Wound Dressing one or multiple wounds X- 2 15 Medium Wound Dressing one or multiple wounds '[]'$  - 0 Large Wound Dressing one or multiple wounds X- 1 5 Application of Medications - topical '[]'$  - 0 Application of Medications - injection INTERVENTIONS - Miscellaneous '[]'$  - 0 External ear exam '[]'$  - 0 Specimen Collection (cultures, biopsies, blood, body fluids, etc.) '[]'$  - 0 Specimen(s) / Culture(s) sent or taken to Lab for analysis '[]'$  - 0 Patient Transfer (multiple staff / Civil Service fast streamer / Similar devices) '[]'$  - 0 Simple Staple / Suture removal (25 or less) '[]'$  - 0 Complex Staple / Suture removal (26 or more) '[]'$  - 0 Hypo / Hyperglycemic Management (close monitor of Blood Glucose) '[]'$  - 0 Ankle / Brachial Index (ABI) - do not check if billed separately X- 1 5 Vital Signs Has the patient been seen at the hospital within the last three years: Yes Total Score: 155 Level Of Care: New/Established - Level 4 Electronic Signature(s) Signed: 01/01/2022 3:34:17 PM By: [name] [name] Entered By: [name] [name] -------------------------------------------------------------------------------- Encounter Discharge Information Details Patient Name: Date of Service: [name] [name]. 01/01/2022 9:15 A M Medical Record Number: [id number] Patient Account Number: [id number] Date of Birth/Sex: Treating [name]: 10-13-1924 (88 y.o. [name] [name] Primary Care [name] [name]:  [name] [name] Other Clinician: Referring [name] [name]: Treating [name] [name]/Extender: [name] [name] in Treatment: 11 Encounter Discharge Information Items Discharge Condition: Stable Ambulatory Status: Wheelchair Discharge Destination: Skilled Nursing Facility Telephoned: No Orders Sent: Yes Transportation: Private Auto Accompanied By: self Schedule Follow-up Appointment: Yes Clinical Summary of Care: Patient Declined Electronic Signature(s) Signed: 01/01/2022 3:34:17 PM By: [name] [name] Entered By: [name] Hammock on 01/01/2022 10:30:52 -------------------------------------------------------------------------------- Lower Extremity Assessment Details Patient Name: Date of Service: [name] [name] 01/01/2022 9:15 A M Medical Record Number: [id number] Patient Account Number: [id number] Date of Birth/Sex: Treating [name]: 16-Feb-1925 (88 y.o. [name] [name] Primary Care Penney Domanski: [name] [name] Other Clinician: Referring Jaceion Aday: Treating Janaa Acero/Extender: Patricia Nettle, Meredith Staggers Weeks in Treatment: 11 Edema Assessment Assessed: Shirlyn Goltz: No] Patrice Paradise: No] Edema: [Left: Yes] [Right: Yes] Calf Left: Right: Point of Measurement: 34 cm From Medial Instep 36.5 cm 37.8 cm Ankle Left: Right: Point of Measurement: 9 cm From Medial Instep 22.6 cm 22.3 cm Electronic Signature(s) Signed: 01/01/2022 3:34:17 PM By: [name] [name] Signed: 01/01/2022 3:44:56 PM By: [name] [name] Entered By: [name] [name] on 01/01/2022 09:36:44 -------------------------------------------------------------------------------- Multi-Disciplinary Care Plan Details Patient Name: Date of Service: [name] [name]. 01/01/2022 9:15 A M Medical Record Number: [id number] Patient Account Number: [id number] Date of Birth/Sex: Treating [name]: 26-Sep-1924 (88 y.o. [name] [name] Primary Care Coreon Simkins: [name] [name] Other Clinician: Referring Zyhir Cappella: Treating  Kioni Stahl/Extender: [name] [name] in Treatment: 11 Active Inactive Wound/Skin Impairment Nursing Diagnoses: Impaired tissue integrity Knowledge deficit related to ulceration/compromised skin integrity Goals: Patient will have a decrease in wound volume by X% from date: (specify in notes) Date Initiated: 10/16/2021 Target Resolution Date: 01/10/2022 Goal Status: Active Patient/caregiver will verbalize understanding of skin care regimen Date Initiated: 10/16/2021 Target Resolution Date: 01/09/2022 Goal Status: Active Ulcer/skin breakdown will have a volume reduction of 30%  by week 4 Date Initiated: 10/16/2021 Target Resolution Date: 01/08/2022 Goal Status: Active Interventions: Assess patient/caregiver ability to obtain necessary supplies Assess patient/caregiver ability to perform ulcer/skin care regimen upon admission and as needed Assess ulceration(s) every visit Notes: Electronic Signature(s) Signed: 01/01/2022 3:34:17 PM By: [name] [name] Entered By: [name] Hammock on 01/01/2022 10:00:45 -------------------------------------------------------------------------------- Pain Assessment Details Patient Name: Date of Service: [name] [name]. 01/01/2022 9:15 A M Medical Record Number: [id number] Patient Account Number: [id number] Date of Birth/Sex: Treating [name]: 1924-11-14 (88 y.o. [name] [name] Primary Care Jannie Doyle: [name] [name] Other Clinician: Referring Ambrose Wile: Treating Numa Heatwole/Extender: [name] [name] in Treatment: 11 Active Problems Location of Pain Severity and Description of Pain Patient Has Paino No Site Locations Pain Management and Medication Current Pain Management: Electronic Signature(s) Signed: 01/01/2022 3:34:17 PM By: [name] [name] Signed: 01/01/2022 3:44:56 PM By: [name] [name] Entered By: [name] [name] on 01/01/2022  09:24:34 -------------------------------------------------------------------------------- Patient/Caregiver Education Details Patient Name: Date of Service: [name] [name] 7/19/2023andnbsp9:15 Ramona Record Number: [id number] Patient Account Number: [id number] Date of Birth/Gender: Treating [name]: 08-Jul-1924 (88 y.o. Erie Noe Primary Care Physician: [name] [name] Other Clinician: Referring Physician: Treating Physician/Extender: [name] [name] in Treatment: 11 Education Assessment Education Provided To: Patient Education Topics Provided Wound/Skin Impairment: Methods: Explain/Verbal Responses: State content correctly Electronic Signature(s) Signed: 01/01/2022 3:34:17 PM By: [name] [name] Entered By: [name] Hammock on 01/01/2022 10:00:57 -------------------------------------------------------------------------------- Wound Assessment Details Patient Name: Date of Service: [name] [name] 01/01/2022 9:15 A M Medical Record Number: [id number] Patient Account Number: [id number] Date of Birth/Sex: Treating [name]: 1924/11/14 (88 y.o. [name] [name] Primary Care Shiri Hodapp: [name] [name] Other Clinician: Referring Petrea Fredenburg: Treating Haden Suder/Extender: [name] [name] in Treatment: 11 Wound Status Wound Number: 4 Primary Venous Leg Ulcer Etiology: Wound Location: Right, Lateral Ankle Wound Open Wounding Event: Gradually Appeared Status: Date Acquired: 11/20/2021 Comorbid Arrhythmia, Congestive Heart Failure, Coronary Artery Disease, Weeks Of Treatment: 6 History: Hypertension, Peripheral Venous Disease, Osteoarthritis, Seizure Clustered Wound: No Disorder Photos Wound Measurements Length: (cm) 0.5 Width: (cm) 0.7 Depth: (cm) 0.2 Area: (cm) 0.275 Volume: (cm) 0.055 % Reduction in Area: 65% % Reduction in Volume: 30.4% Epithelialization: None Tunneling: No Undermining: No Wound  Description Classification: Full Thickness With Exposed Support Structures Wound Margin: Distinct, outline attached Exudate Amount: Medium Exudate Type: Serosanguineous Exudate Color: red, brown Foul Odor After Cleansing: No Slough/Fibrino Yes Wound Bed Granulation Amount: None Present (0%) Necrotic Amount: Large (67-100%) Necrotic Quality: Eschar, Adherent Slough Treatment Notes Wound #4 (Ankle) Wound Laterality: Right, Lateral Cleanser Soap and Water Discharge Instruction: May shower and wash wound with dial antibacterial soap and water prior to dressing change. Wound Cleanser Discharge Instruction: Cleanse the wound with wound cleanser prior to applying a clean dressing using gauze sponges, not tissue or cotton balls. Peri-Wound Care Zinc Oxide Ointment 30g tube Discharge Instruction: Apply Zinc Oxide to periwound with each dressing change Sween Lotion (Moisturizing lotion) Discharge Instruction: Apply moisturizing lotion as directed Topical Primary Dressing Xeroform Occlusive Gauze Dressing, 4x4 in Discharge Instruction: Apply to wound bed as instructed Secondary Dressing Bordered Gauze, 4x4 in Discharge Instruction: Apply over primary dressing as directed. Secured With Compression Wrap Tubi grips size D Compression Stockings Add-Ons Electronic Signature(s) Signed: 01/01/2022 3:34:17 PM By: [name] [name] Signed: 01/01/2022 3:44:56 PM By: [name] [name] Entered By: [name] [name] on 01/01/2022 09:37:29 -------------------------------------------------------------------------------- Vitals Details Patient Name: Date of Service: [name] Russell E. 01/01/2022 9:15 A M  Medical Record Number: [id number] Patient Account Number: [id number] Date of Birth/Sex: Treating [name]: 03/30/1925 (88 y.o. [name] [name] Primary Care Shavonda Wiedman: [name] [name] Other Clinician: Referring Davanee Klinkner: Treating Ely Spragg/Extender: Patricia Nettle, Ihor Gully in Treatment:  11 Vital Signs Time Taken: 09:24 Temperature (F): 98 Pulse (bpm): 67 Respiratory Rate (breaths/min): 18 Blood Pressure (mmHg): 144/67 Reference Range: 80 - 120 mg / dl Electronic Signature(s) Signed: 01/01/2022 3:44:56 PM By: [name] [name] Entered By: [name] [name] on 01/01/2022 09:24:26

## 2022-01-15 ENCOUNTER — Encounter (HOSPITAL_BASED_OUTPATIENT_CLINIC_OR_DEPARTMENT_OTHER): Payer: Medicare HMO | Attending: Physician Assistant | Admitting: Physician Assistant

## 2022-01-15 DIAGNOSIS — G40909 Epilepsy, unspecified, not intractable, without status epilepticus: Secondary | ICD-10-CM | POA: Insufficient documentation

## 2022-01-15 DIAGNOSIS — I5042 Chronic combined systolic (congestive) and diastolic (congestive) heart failure: Secondary | ICD-10-CM | POA: Insufficient documentation

## 2022-01-15 DIAGNOSIS — L97822 Non-pressure chronic ulcer of other part of left lower leg with fat layer exposed: Secondary | ICD-10-CM | POA: Insufficient documentation

## 2022-01-15 DIAGNOSIS — L97318 Non-pressure chronic ulcer of right ankle with other specified severity: Secondary | ICD-10-CM | POA: Diagnosis not present

## 2022-01-15 DIAGNOSIS — I87331 Chronic venous hypertension (idiopathic) with ulcer and inflammation of right lower extremity: Secondary | ICD-10-CM | POA: Diagnosis not present

## 2022-01-15 DIAGNOSIS — I87332 Chronic venous hypertension (idiopathic) with ulcer and inflammation of left lower extremity: Secondary | ICD-10-CM | POA: Diagnosis not present

## 2022-01-15 DIAGNOSIS — I110 Hypertensive heart disease with heart failure: Secondary | ICD-10-CM | POA: Insufficient documentation

## 2022-01-15 DIAGNOSIS — L97812 Non-pressure chronic ulcer of other part of right lower leg with fat layer exposed: Secondary | ICD-10-CM | POA: Diagnosis not present

## 2022-01-15 NOTE — Progress Notes (Addendum)
CHRISTERPHER, CLOS ([id number]) Visit Report for 01/15/2022 Chief Complaint Document Details Patient Name: Date of Service: [name] [name], [name] [name] 01/15/2022 10:00 [place] Record Number: [id number] Patient Account Number: [id number] Date of Birth/Sex: Treating RN: 07-01-1924 (88 y.o. [name], [name] Primary Care Provider: [name] Other Clinician: Referring Provider: Treating Provider/Extender: [name] in Treatment: 13 Information Obtained from: Patient Chief Complaint Bilateral LE Ulcers Electronic Signature(s) Signed: 01/15/2022 10:24:18 AM By: Worthy Keeler PA-C Entered By: Worthy Keeler on 01/15/2022 10:24:18 -------------------------------------------------------------------------------- HPI Details Patient Name: Date of Service: [name] [name], [name] [name] 01/15/2022 10:00 [place] Record Number: [id number] Patient Account Number: [id number] Date of Birth/Sex: Treating RN: [birth year] (88 y.o. [name], [name] Primary Care Provider: [name] Other Clinician: Referring Provider: Treating Provider/Extender: [name] in Treatment: 13 History of Present Illness HPI Description: ADMISSION 04/02/2021 This is a pleasant 88 year old man sent to our clinic by Dr. Lyndel Safe from the skilled unit of [facility]. He has a wound on the right posterior calf. I am uncertain about the duration of this however it looks to be chronic. They are using Santyl covered with silver alginate he does not have any compression. The wound is 100% covered in very fibrinous adherent slough. It also appears that this is quite painful. The patient is exceptionally hard of hearing so it is difficult to get any additional history out of him. Past medical history includes hypertension, coronary artery disease, congestive heart failure, BPH, seizure disorder. He is nonambulatory for reasons that are not totally clear. He lives at the  [facility] unit of [facility]. ABI in our clinic was 1.03 on the right 11/2; 2-week follow-up. He is having the dressing changed at the facility. He arrives in the clinic with the area about the same in terms of surface area but better looking surface we have been using Iodoflex under 3 layer compression. Unfortunately we still do not have any history of exactly how long this is been there or how it happened. 11/16; 2-week follow-up. Nice improvement in the wound. He is using Iodoflex over 3 layer compression. 11/30 2-week follow-up. [facility] is [facility] [place]. We have been using Iodoflex under 3 layer compression. They are not wrapping his leg properly but nevertheless the wound area has come down nicely. The patient requests coming here every 3 weeks instead of every 2 12/21; the area on the posterior right calf is fully epithelialized. The patient has chronic venous insufficiency. Readmission: 10-16-2021 upon evaluation today patient appears to be doing somewhat poorly in regard to the wounds on the bilateral lower extremities. He has been tolerating the dressing changes without complication. Fortunately there does not appear to be any evidence of active infection locally or systemically which is great news. Overall I am extremely pleased with where things stand. With that being said that obviously he has quite a bit of swelling and this is led to several areas of significant blistering over his bilateral lower extremities all the wounds are superficial this is the good news. Patient continues to have issues with chronic venous hypertension bilaterally with ulcerations. He also has a history of hypertension and congestive heart failure. He previously tolerated 3 layer compression wraps without complication. He was last seen and discharged in our office June 05, 2021. 10-23-2021 upon evaluation today patient appears to be doing excellent in regard to his legs. They do  look better although  there is a lot of bright green drainage I do believe this likely represents a Pseudomonas infection and that was discussed with him as best I could today again he is extremely hard of hearing. I had to write most of what I wanted him to know down. 10-30-2021 upon evaluation 20 today patient appears to be doing much better in regard to his leg ulcers. He has been taking the Levaquin without complication and overall very pleased in that regard. Fortunately I do not see any evidence of infection locally or systemically which is great news. No fevers, chills, nausea, vomiting, or diarrhea. 11-06-2021 upon evaluation today patient appears to be doing excellent in regard to his wounds. In fact the left leg is almost completely healed the right leg is significantly smaller. Overall I do believe we are on the right track here. 6/7; 2-week follow-up. The patient's left leg is essentially closed. His superficial areas anteriorly look like they are doing very well with nice rims of epithelialization. UNFORTUNATELY he has a new wound on the right lateral malleolus the exact etiology of this is not really clear but he is mostly complaining of this wound. We have been using Xeroform under kerlix Coban The patient is in the [facility]. I am uncertain whether he has 20/30 below-knee stockings or not but he is definitely going to need them. 12-04-2021 upon evaluation today patient appears to be doing better in regard to his wound especially on the left side of the lower extremity although the right side he has a new blister healing of the other wounds are very close to complete closure. This is due to it not being wrapped appropriately from the knee down to the base of the toes. This has been in the orders for the past several visits and to be honest I am not really sure why its not being done as ordered. 12-18-2021 upon evaluation today patient appears to be doing well  currently in regard to his wounds. In fact he is showing signs of excellent improvement which is great news and in fact is only the right ankle which is still open. Overall everything else is doing awesome. I do not see any evidence of active infection locally or systemically at this time. 01-01-2022 upon evaluation today patient appears to be doing well with regard to his wound. He is overall showing signs of excellent improvement which is great news. Fortunately I do not see any evidence of active infection locally or systemically at this time. 01-15-2022 upon evaluation today patient unfortunately is extremely swollen on the left leg and has a new wound which is open at this point. Again I discussed with the patient that this is quite unfortunate especially in light of the fact that he is previously noted to have been completely healed when I saw him 2 weeks ago. Again I believe this is likely open due to the fact that he has not had impression on this leg the facility was supposed to have already ordered and started the compression socks I am not sure that ever happened but also is possible he may have been pushing the compression socks down I really do not know as the facility has not contacted [place] to relay any information whatsoever. Electronic Signature(s) Signed: 01/15/2022 1:43:28 PM By: Worthy Keeler PA-C Entered By: Worthy Keeler on 01/15/2022 13:43:28 -------------------------------------------------------------------------------- Physical Exam Details Patient Name: Date of Service: [name] [name], [name] [name] 01/15/2022 10:00 A M Medical Record  Number: [id number] Patient Account Number: [id number] Date of Birth/Sex: Treating RN: 07/28/1924 (88 y.o. [name], [name] Primary Care Provider: [name] Other Clinician: Referring Provider: Treating Provider/Extender: Patricia Nettle, Ihor Gully in Treatment: 78 Constitutional Well-nourished and well-hydrated in no acute  distress. Respiratory normal breathing without difficulty. Psychiatric this patient is able to make decisions and demonstrates good insight into disease process. Alert and Oriented x 3. pleasant and cooperative. Notes Upon inspection patient's wound bed actually showed signs of good granulation and epithelization at this point. Fortunately I think both areas are actually getting healed quite nicely. With that being said I do think continue with the Xeroform is the right way to go we will continue as well with the Tubigrip which I think is safer than the Curlex and Coban wraps will be doing this bilaterally. Electronic Signature(s) Signed: 01/15/2022 1:43:51 PM By: Worthy Keeler PA-C Entered By: Worthy Keeler on 01/15/2022 13:43:51 -------------------------------------------------------------------------------- Physician Orders Details Patient Name: Date of Service: HOLGER, SOKOLOWSKI 01/15/2022 10:00 [place] Record Number: [id number] Patient Account Number: [id number] Date of Birth/Sex: Treating RN: Dec 15, 1924 (88 y.o. 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 - Wed. 01/29/22 at 10:00 AM w/ Toney Rakes Room # 9 Return A Other: - ****Tubi grips to the right and left leg. It may be used for 1 week. I have provided another 2 tubi grips and sent it in the envelope**** Bathing/ Shower/ Hygiene May shower with protection but do not get wound dressing(s) wet. Edema Control - Lymphedema / SCD / Other Elevate legs to the level of the heart or above for 30 minutes daily and/or when sitting, a frequency of: Avoid standing for long periods of time. Wound Treatment Wound #4 - Ankle Wound Laterality: Right, Lateral Cleanser: Soap and Water Every Other Day/15 Days Discharge Instructions: May shower and wash wound with dial antibacterial soap and water prior to dressing change. Cleanser: Wound Cleanser Every Other Day/15 Days Discharge Instructions: Cleanse the wound with wound cleanser prior to applying a clean dressing using gauze sponges, not tissue or cotton balls. Peri-Wound Care: Zinc Oxide Ointment 30g tube Every Other Day/15 Days Discharge Instructions: Apply Zinc Oxide to periwound with each dressing change Peri-Wound Care: Sween Lotion (Moisturizing lotion) Every Other Day/15 Days Discharge Instructions: Apply moisturizing lotion as directed Prim Dressing: Xeroform Occlusive Gauze Dressing, 4x4 in Every Other Day/15 Days ary Discharge Instructions: Apply to wound bed as instructed Secondary Dressing: Bordered Gauze, 4x4 in Every Other Day/15 Days Discharge Instructions: Apply over primary dressing as directed. Compression Wrap: Tubi grips size D Every Other Day/15 Days Wound #6 - Lower Leg Wound Laterality: Left, Anterior Cleanser: Soap and Water Every Other Day/15 Days Discharge Instructions: May shower and wash wound with dial antibacterial soap and water prior to dressing change. Cleanser: Wound Cleanser  Every Other Day/15 Days Discharge Instructions: Cleanse the wound with wound cleanser prior to applying  a clean dressing using gauze sponges, not tissue or cotton balls. Peri-Wound Care: Zinc Oxide Ointment 30g tube Every Other Day/15 Days Discharge Instructions: Apply Zinc Oxide to periwound with each dressing change Peri-Wound Care: Sween Lotion (Moisturizing lotion) Every Other Day/15 Days Discharge Instructions: Apply moisturizing lotion as directed Prim Dressing: Xeroform Occlusive Gauze Dressing, 4x4 in Every Other Day/15 Days ary Discharge Instructions: Apply to wound bed as instructed Secondary Dressing: Bordered Gauze, 4x4 in Every Other Day/15 Days Discharge Instructions: Apply over primary dressing as directed. Compression Wrap: Tubi grips size D Every Other Day/15 Days Electronic Signature(s) Signed: 01/15/2022 5:04:53 PM By: Worthy Keeler PA-C Signed: 01/15/2022 5:11:01 PM By: Sabas Sous By: Adline Peals on 01/15/2022 11:15:57 -------------------------------------------------------------------------------- Problem List Details Patient Name: Date of Service: [name] [name]. 01/15/2022 10:00 A M Medical Record Number: [id number] Patient Account Number: [id number] Date of Birth/Sex: Treating RN: 06-27-1924 (88 y.o. 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 11/20/2021 No Yes L97.812 Non-pressure chronic ulcer of other part of  right lower leg with fat layer 10/16/2021 No Yes exposed [place] (primary) hypertension 10/16/2021 No Yes I50.42 Chronic combined systolic (congestive) and diastolic (congestive) heart failure 10/16/2021 No Yes Inactive Problems Resolved Problems Electronic Signature(s) Signed: 01/15/2022 10:24:02 AM By: Worthy Keeler PA-C Signed: 01/15/2022 10:24:02 AM By: Worthy Keeler PA-C Entered By: Worthy Keeler on 01/15/2022 10:24:02 -------------------------------------------------------------------------------- Progress Note Details Patient Name: Date of Service: [name] [name]. 01/15/2022 10:00 A M Medical Record Number: [id number] Patient Account Number: [id number] Date of Birth/Sex: Treating RN: [birth year] (88 y.o. [name], [name] Primary Care Provider: [name] Other Clinician: Referring Provider: Treating Provider/Extender: [name] in Treatment: 13 Subjective Chief Complaint Information obtained from Patient Bilateral LE Ulcers History of Present Illness (HPI) ADMISSION 04/02/2021 This is a pleasant 88 year old man sent to our clinic by Dr. Lyndel Safe from the skilled unit of [facility] [place]. He has a wound on the right posterior calf. I am uncertain about the duration of this however it looks to be chronic. They are using Santyl covered with silver alginate he does not have any compression. The wound is 100% covered in very fibrinous adherent slough. It also appears that this is quite painful. The patient is exceptionally hard of hearing so it is difficult to get any additional history out of him. Past medical history includes hypertension, coronary artery disease, congestive heart failure, BPH, seizure disorder. He is nonambulatory for reasons that are not totally clear. He lives at the [facility] unit of [facility] [place]. ABI in our clinic was 1.03 on the right 11/2; 2-week follow-up. He is having the dressing changed at the facility.  He arrives in the clinic with the area about the same in terms of surface area but better looking surface we have been using Iodoflex under 3 layer compression. Unfortunately we still do not have any  history of exactly how long this is been there or how it happened. 11/16; 2-week follow-up. Nice improvement in the wound. He is using Iodoflex over 3 layer compression. 11/30 2-week follow-up. [facility] is at the [facility] [place]. We have been using Iodoflex under 3 layer compression. They are not wrapping his leg properly but nevertheless the wound area has come down nicely. The patient requests coming here every 3 weeks instead of every 2 12/21; the area on the posterior right calf is fully epithelialized. The patient has chronic venous insufficiency. Readmission: 10-16-2021 upon evaluation today patient appears to be doing somewhat poorly in regard to the wounds on the bilateral lower extremities. He has been tolerating the dressing changes without complication. Fortunately there does not appear to be any evidence of active infection locally or systemically which is great news. Overall I am extremely pleased with where things stand. With that being said that obviously he has quite a bit of swelling and this is led to several areas of significant blistering over his bilateral lower extremities all the wounds are superficial this is the good news. Patient continues to have issues with chronic venous hypertension bilaterally with ulcerations. He also has a history of hypertension and congestive heart failure. He previously tolerated 3 layer compression wraps without complication. He was last seen and discharged in our office June 05, 2021. 10-23-2021 upon evaluation today patient appears to be doing excellent in regard to his legs. They do look better although there is a lot of bright green drainage I do believe this likely represents a Pseudomonas infection and that was discussed with him as  best I could today again he is extremely hard of hearing. I had to write most of what I wanted him to know down. 10-30-2021 upon evaluation 20 today patient appears to be doing much better in regard to his leg ulcers. He has been taking the Levaquin without complication and overall very pleased in that regard. Fortunately I do not see any evidence of infection locally or systemically which is great news. No fevers, chills, nausea, vomiting, or diarrhea. 11-06-2021 upon evaluation today patient appears to be doing excellent in regard to his wounds. In fact the left leg is almost completely healed the right leg is significantly smaller. Overall I do believe we are on the right track here. 6/7; 2-week follow-up. The patient's left leg is essentially closed. His superficial areas anteriorly look like they are doing very well with nice rims of epithelialization. UNFORTUNATELY he has a new wound on the right lateral malleolus the exact etiology of this is not really clear but he is mostly complaining of this wound. We have been using Xeroform under kerlix Coban The patient is in the [facility] [place]. I am uncertain whether he has 20/30 below-knee stockings or not but he is definitely going to need them. 12-04-2021 upon evaluation today patient appears to be doing better in regard to his wound especially on the left side of the lower extremity although the right side he has a new blister healing of the other wounds are very close to complete closure. This is due to it not being wrapped appropriately from the knee down to the base of the toes. This has been in the orders for the past several visits and to be honest I am not really sure why its not being done as ordered. 12-18-2021 upon evaluation today patient appears to be doing well currently in regard to  his wounds. In fact he is showing signs of excellent improvement which is great news and in fact is only the right ankle which is  still open. Overall everything else is doing awesome. I do not see any evidence of active infection locally or systemically at this time. 01-01-2022 upon evaluation today patient appears to be doing well with regard to his wound. He is overall showing signs of excellent improvement which is great news. Fortunately I do not see any evidence of active infection locally or systemically at this time. 01-15-2022 upon evaluation today patient unfortunately is extremely swollen on the left leg and has a new wound which is open at this point. Again I discussed with the patient that this is quite unfortunate especially in light of the fact that he is previously noted to have been completely healed when I saw him 2 weeks ago. Again I believe this is likely open due to the fact that he has not had impression on this leg the facility was supposed to have already ordered and started the compression socks I am not sure that ever happened but also is possible he may have been pushing the compression socks down I really do not know as the facility has not contacted [place] to relay any information whatsoever. Objective Constitutional Well-nourished and well-hydrated in no acute distress. Vitals Time Taken: 10:18 AM, Temperature: 97.7 F, Pulse: 60 bpm, Respiratory Rate: 18 breaths/min, Blood Pressure: 143/66 mmHg. Respiratory normal breathing without difficulty. Psychiatric this patient is able to make decisions and demonstrates good insight into disease process. Alert and Oriented x 3. pleasant and cooperative. General Notes: Upon inspection patient's wound bed actually showed signs of good granulation and epithelization at this point. Fortunately I think both areas are actually getting healed quite nicely. With that being said I do think continue with the Xeroform is the right way to go we will continue as well with the Tubigrip which I think is safer than the Curlex and Coban wraps will be doing this  bilaterally. Integumentary (Hair, Skin) Wound #4 status is Open. Original cause of wound was Gradually Appeared. The date acquired was: 11/20/2021. The wound has been in treatment 8 weeks. The wound is located on the Right,Lateral Ankle. The wound measures 0.4cm length x 0.8cm width x 0.2cm depth; 0.251cm^2 area and 0.05cm^3 volume. There is no tunneling or undermining noted. There is a medium amount of serosanguineous drainage noted. The wound margin is distinct with the outline attached to the wound base. There is no granulation within the wound bed. There is a large (67-100%) amount of necrotic tissue within the wound bed including Eschar and Adherent Slough. Wound #6 status is Open. Original cause of wound was Blister. The date acquired was: 01/15/2022. The wound is located on the Left,Anterior Lower Leg. The wound measures 3cm length x 3.7cm width x 0.1cm depth; 8.718cm^2 area and 0.872cm^3 volume. There is Fat Layer (Subcutaneous Tissue) exposed. There is no tunneling or undermining noted. There is a medium amount of serosanguineous drainage noted. The wound margin is distinct with the outline attached to the wound base. There is large (67-100%) red granulation within the wound bed. There is a small (1-33%) amount of necrotic tissue within the wound bed including Adherent Slough. 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 - Wed. 01/29/22 at 10:00 AM w/ Toney Rakes Room # 9 Other: - ****Tubi grips to the right and left leg. It may be used for 1 week. I have provided another 2 tubi grips and sent it in the envelope**** Bathing/ Shower/ Hygiene: May shower with protection but do not get wound dressing(s) wet. Edema Control - Lymphedema / SCD / Other: Elevate legs to the level of the heart or above for 30 minutes daily and/or when sitting, a frequency of: Avoid standing for long periods of time. WOUND #4: - Ankle Wound Laterality: Right, Lateral Cleanser: Soap and Water Every Other Day/15 Days Discharge Instructions: May shower and wash wound with dial antibacterial soap and water prior to dressing change. Cleanser: Wound Cleanser Every Other Day/15 Days Discharge Instructions: Cleanse the wound with wound cleanser prior to applying a clean dressing using gauze sponges, not tissue or cotton balls. Peri-Wound Care: Zinc Oxide Ointment 30g tube Every Other Day/15 Days Discharge Instructions: Apply Zinc Oxide to periwound with each dressing change Peri-Wound Care: Sween Lotion (Moisturizing lotion) Every Other Day/15 Days Discharge Instructions: Apply moisturizing lotion as directed Prim Dressing: Xeroform Occlusive Gauze Dressing, 4x4 in Every Other Day/15 Days ary Discharge Instructions: Apply to wound bed as instructed Secondary Dressing: Bordered Gauze, 4x4 in Every Other Day/15 Days Discharge Instructions: Apply over primary dressing as directed. Com pression Wrap: Tubi grips size D Every Other Day/15 Days WOUND #6: - Lower Leg Wound Laterality: Left, Anterior Cleanser: Soap and Water Every Other Day/15 Days Discharge Instructions: May shower and wash wound with dial antibacterial soap and water prior to dressing change. Cleanser: Wound Cleanser Every Other Day/15 Days Discharge Instructions: Cleanse the wound with wound cleanser prior to applying a clean dressing using gauze sponges, not tissue  or cotton balls. Peri-Wound Care: Zinc Oxide Ointment 30g tube Every Other Day/15 Days Discharge Instructions: Apply Zinc Oxide to periwound with each dressing change Peri-Wound Care: Sween Lotion (Moisturizing lotion) Every Other Day/15 Days Discharge Instructions: Apply moisturizing lotion as directed Prim Dressing: Xeroform Occlusive Gauze Dressing, 4x4 in Every Other Day/15 Days ary Discharge Instructions: Apply to wound bed as instructed Secondary Dressing: Bordered Gauze, 4x4 in Every Other Day/15 Days Discharge Instructions: Apply over primary dressing as directed. Com pression Wrap: Tubi grips size D Every Other Day/15 Days 1. I would recommend that we go ahead and continue with the wound care measures as before and the patient is in agreement with plan this includes the use of the Xeroform gauze dressing. 2. We will continue to cover this with a bordered gauze dressing followed by Tubigrip size D to help with edema control. 3. I am also can recommend patient should continue to monitor for any signs of worsening infection this should be done at the facility if anything changes the facility should contact my office for additional recommendations. We will see patient back for reevaluation in 1 week here in the clinic. If anything worsens or changes patient will contact our office for additional recommendations. Electronic Signature(s) Signed: 01/15/2022 1:44:39 PM By: Worthy Keeler PA-C Entered By: Worthy Keeler on 01/15/2022 13:44:39 -------------------------------------------------------------------------------- SuperBill Details Patient Name: Date of Service: [name] [name], [name] [name] 01/15/2022 Medical Record Number: [id number] Patient Account Number: [id number] Date  of Birth/Sex: Treating RN: 1925/05/27 (88 y.o. 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 L97.812 Non-pressure chronic ulcer of other part of right lower leg with fat layer exposed I10 Essential (primary) hypertension I50.42 Chronic combined systolic (congestive) and diastolic (congestive) heart failure Facility Procedures CPT4 Code: 91478295 Description: 99213 - WOUND CARE VISIT-LEV 3 EST PT Modifier: Quantity: 1 Physician Procedures : CPT4 Code Description Modifier [id number] - WC PHYS LEVEL 3 - EST PT ICD-10 Diagnosis Description I87.331 Chronic venous hypertension (idiopathic) with ulcer and inflammation of right lower extremity I87.332 Chronic venous hypertension (idiopathic)  with ulcer and inflammation of left lower extremity L97.822 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

## 2022-01-16 NOTE — Progress Notes (Signed)
[name], [name] ([id number]) Visit Report for 01/15/2022 Arrival Information Details Patient Name: Date of Service: [name] [name], [name] [name] 01/15/2022 10:00 [place] Record Number: [id number] Patient Account Number: [id number] Date of Birth/Sex: Treating RN: 01/08/1925 (88 y.o. [name] [name], [name] [name] Brienne Liguori: [name] Other Clinician: Referring Saveah Bahar: Treating Sulayman Manning/Extender: [name] in Treatment: 13 Visit Information History Since Last Visit Added or deleted any medications: No Patient Arrived: Wheel Chair Any new allergies or adverse reactions: No Arrival Time: 10:17 Had a fall or experienced change in No Accompanied By: self activities of daily living that may affect Transfer Assistance: Manual risk of falls: Patient Identification Verified: Yes Signs or symptoms of abuse/neglect since last visito No Secondary Verification Process Completed: Yes Hospitalized since last visit: No Patient Requires Transmission-Based Precautions: No Implantable device outside of the clinic excluding No Patient Has Alerts: Yes cellular tissue based products placed in the center Patient Alerts: ABI's 05/23 R: N/C since last visit: Has Compression in Place as Prescribed: Yes Pain Present Now: No Electronic Signature(s) Signed: 01/15/2022 4:41:40 PM By: Erenest Blank Entered By: Erenest Blank on 01/15/2022 10:18:48 -------------------------------------------------------------------------------- Clinic Level of [name] Assessment Details Patient Name: Date of Service: [name] [name], [name] [name] 01/15/2022 10:00 A M Medical Record Number: [id number] Patient Account Number: [id number] Date of Birth/Sex: Treating RN: [birth year] (88 y.o. [name] [name] Primary [name] Hannahgrace Lalli: [name] Other Clinician: Referring Buddie Marston: Treating Jamarkis Branam/Extender: [name] in Treatment: 13 Clinic Level of [name] Assessment  Items TOOL 4 Quantity Score X- 1 0 Use when only an EandM is performed on FOLLOW-UP visit ASSESSMENTS - Nursing Assessment / Reassessment X- 1 10 Reassessment of Co-morbidities (includes updates in patient status) X- 1 5 Reassessment of Adherence to Treatment Plan ASSESSMENTS - Wound and Skin A ssessment / Reassessment X - Simple Wound Assessment / Reassessment - one wound 1 5 '[]'$  - 0 Complex Wound Assessment / Reassessment - multiple wounds '[]'$  - 0 Dermatologic / Skin Assessment (not related to wound area) ASSESSMENTS - Focused Assessment X- 1 5 Circumferential Edema Measurements - multi extremities '[]'$  - 0 Nutritional Assessment / Counseling / Intervention X- 1 5 Lower Extremity Assessment (monofilament, tuning fork, pulses) '[]'$  - 0 Peripheral Arterial Disease Assessment (using hand held doppler) ASSESSMENTS - Ostomy and/or Continence Assessment and [name] '[]'$  - 0 Incontinence Assessment and Management '[]'$  - 0 Ostomy [name] Assessment and Management (repouching, etc.) PROCESS - Coordination of [name] X - Simple Patient / Family Education for ongoing [name] 1 15 '[]'$  - 0 Complex (extensive) Patient / Family Education for ongoing [name] X- 1 10 Staff obtains Programmer, systems, Records, T Results / Process Orders est '[]'$  - 0 Staff telephones HHA, Nursing Homes / Clarify orders / etc '[]'$  - 0 Routine Transfer to another Facility (non-emergent condition) '[]'$  - 0 Routine Hospital Admission (non-emergent condition) '[]'$  - 0 New Admissions / Biomedical engineer / Ordering NPWT Apligraf, etc. , '[]'$  - 0 Emergency Hospital Admission (emergent condition) X- 1 10 Simple Discharge Coordination '[]'$  - 0 Complex (extensive) Discharge Coordination PROCESS - Special Needs '[]'$  - 0 Pediatric / Minor Patient Management '[]'$  - 0 Isolation Patient Management '[]'$  - 0 Hearing / Language / Visual special needs '[]'$  - 0 Assessment of Community assistance (transportation, D/C planning, etc.) '[]'$  - 0 Additional  assistance / Altered mentation '[]'$  - 0 Support Surface(s) Assessment (bed, cushion, seat, etc.) INTERVENTIONS - Wound Cleansing / Measurement X - Simple Wound Cleansing - one wound 1 5 '[]'$  -  0 Complex Wound Cleansing - multiple wounds X- 1 5 Wound Imaging (photographs - any number of wounds) '[]'$  - 0 Wound Tracing (instead of photographs) X- 1 5 Simple Wound Measurement - one wound '[]'$  - 0 Complex Wound Measurement - multiple wounds INTERVENTIONS - Wound Dressings X - Small Wound Dressing one or multiple wounds 1 10 '[]'$  - 0 Medium Wound Dressing one or multiple wounds '[]'$  - 0 Large Wound Dressing one or multiple wounds '[]'$  - 0 Application of Medications - topical '[]'$  - 0 Application of Medications - injection INTERVENTIONS - Miscellaneous '[]'$  - 0 External ear exam '[]'$  - 0 Specimen Collection (cultures, biopsies, blood, body fluids, etc.) '[]'$  - 0 Specimen(s) / Culture(s) sent or taken to Lab for analysis '[]'$  - 0 Patient Transfer (multiple staff / Civil Service fast streamer / Similar devices) '[]'$  - 0 Simple Staple / Suture removal (25 or less) '[]'$  - 0 Complex Staple / Suture removal (26 or more) '[]'$  - 0 Hypo / Hyperglycemic Management (close monitor of Blood Glucose) '[]'$  - 0 Ankle / Brachial Index (ABI) - do not check if billed separately X- 1 5 Vital Signs Has the patient been seen at the hospital within the last three years: Yes Total Score: 95 Level Of [name]: New/Established - Level 3 Electronic Signature(s) Signed: 01/15/2022 5:11:01 PM By: Adline Peals Entered By: Adline Peals on 01/15/2022 12:05:15 -------------------------------------------------------------------------------- Encounter Discharge Information Details Patient Name: Date of Service: [name] [name]. 01/15/2022 10:00 A M Medical Record Number: [id number] Patient Account Number: [id number] Date of Birth/Sex: Treating RN: October 03, 1924 (88 y.o. [name] [name] Primary [name] Vedanth Sirico: [name] Other  Clinician: Referring Ayriana Wix: Treating Courtny Bennison/Extender: [name] in Treatment: 13 Encounter Discharge Information Items Discharge Condition: Stable Ambulatory Status: Wheelchair Discharge Destination: Home Transportation: Private Auto Accompanied By: self Schedule Follow-up Appointment: Yes Clinical Summary of [name]: Patient Declined Electronic Signature(s) Signed: 01/15/2022 5:11:01 PM By: Sabas Sous By: Adline Peals on 01/15/2022 12:04:06 -------------------------------------------------------------------------------- Lower Extremity Assessment Details Patient Name: Date of Service: [name] [name], [name] [name] 01/15/2022 10:00 [place] Record Number: [id number] Patient Account Number: [id number] Date of Birth/Sex: Treating RN: 10-17-1924 (88 y.o. [name] [name], [name] [name] Kaegan Stigler: [name] Other Clinician: Referring Jearl Soto: Treating Terald Jump/Extender: Patricia Nettle, Meredith Staggers Weeks in Treatment: 13 Edema Assessment Assessed: Shirlyn Goltz: No] Patrice Paradise: No] Edema: [Left: Yes] [Right: Yes] Calf Left: Right: Point of Measurement: 34 cm From Medial Instep 36.5 cm 37 cm Ankle Left: Right: Point of Measurement: 9 cm From Medial Instep 22.6 cm 23 cm Electronic Signature(s) Signed: 01/15/2022 4:41:40 PM By: Erenest Blank Signed: 01/16/2022 3:56:15 PM By: Rhae Hammock RN Entered By: Erenest Blank on 01/15/2022 10:31:15 -------------------------------------------------------------------------------- Multi-Disciplinary [name] Plan Details Patient Name: Date of Service: [name] [name]. 01/15/2022 10:00 A M Medical Record Number: [id number] Patient Account Number: [id number] Date of Birth/Sex: Treating RN: 1925/01/12 (88 y.o. [name] [name] Primary [name] Kiley Solimine: [name] Other Clinician: Referring Quinnlyn Hearns: Treating Gilmar Bua/Extender: [name] in Treatment: 13 Active  Inactive Wound/Skin Impairment Nursing Diagnoses: Impaired tissue integrity Knowledge deficit related to ulceration/compromised skin integrity Goals: Patient will have a decrease in wound volume by X% from date: (specify in notes) Date Initiated: 10/16/2021 Target Resolution Date: 02/14/2022 Goal Status: Active Patient/caregiver will verbalize understanding of skin [name] regimen Date Initiated: 10/16/2021 Target Resolution Date: 02/14/2022 Goal Status: Active Ulcer/skin breakdown will have a volume reduction of 30% by week 4 Date Initiated: 10/16/2021 Target Resolution Date: 02/14/2022 Goal Status:  Active Interventions: Assess patient/caregiver ability to obtain necessary supplies Assess patient/caregiver ability to perform ulcer/skin [name] regimen upon admission and as needed Assess ulceration(s) every visit Notes: Electronic Signature(s) Signed: 01/15/2022 5:11:01 PM By: Adline Peals Entered By: Adline Peals on 01/15/2022 11:04:36 -------------------------------------------------------------------------------- Pain Assessment Details Patient Name: Date of Service: [name] [name], [name] [name] 01/15/2022 10:00 [place] Record Number: [id number] Patient Account Number: [id number] Date of Birth/Sex: Treating RN: 10-23-1924 (88 y.o. [name] [name], [name] [name] Verdia Bolt: [name] Other Clinician: Referring Joenathan Sakuma: Treating Arcadio Cope/Extender: [name] in Treatment: 13 Active Problems Location of Pain Severity and Description of Pain Patient Has Paino No Site Locations Pain Management and Medication Current Pain Management: Electronic Signature(s) Signed: 01/15/2022 4:41:40 PM By: Erenest Blank Signed: 01/16/2022 3:56:15 PM By: Rhae Hammock RN Entered By: Erenest Blank on 01/15/2022 10:19:17 -------------------------------------------------------------------------------- Patient/Caregiver Education Details Patient Name: Date of  Service: [name] [name], [name] M E. 8/2/2023andnbsp10:00 A M Medical Record Number: [id number] Patient Account Number: [id number] Date of Birth/Gender: Treating RN: 20-Nov-1924 (88 y.o. [name] [name] Primary [name] Physician: [name] Other Clinician: Referring Physician: Treating Physician/Extender: [name] in Treatment: 13 Education Assessment Education Provided To: Patient Education Topics Provided Wound/Skin Impairment: Methods: Explain/Verbal Responses: Reinforcements needed, State content correctly Electronic Signature(s) Signed: 01/15/2022 5:11:01 PM By: Adline Peals Entered By: Adline Peals on 01/15/2022 11:04:48 -------------------------------------------------------------------------------- Wound Assessment Details Patient Name: Date of Service: ELMO, RIO 01/15/2022 10:00 A M Medical Record Number: [id number] Patient Account Number: [id number] Date of Birth/Sex: Treating RN: 02-Nov-1924 (88 y.o. [name] [name], [name] [name] Omie Ferger: [name] Other Clinician: Referring Malley Hauter: Treating Samaiyah Howes/Extender: [name] in Treatment: 13 Wound Status Wound Number: 4 Primary Venous Leg Ulcer Etiology: Wound Location: Right, Lateral Ankle Wound Open Wounding Event: Gradually Appeared Status: Date Acquired: 11/20/2021 Comorbid Arrhythmia, Congestive Heart Failure, Coronary Artery Disease, Weeks Of Treatment: 8 History: Hypertension, Peripheral Venous Disease, Osteoarthritis, Seizure Clustered Wound: No Disorder Photos Wound Measurements Length: (cm) 0.4 Width: (cm) 0.8 Depth: (cm) 0.2 Area: (cm) 0.251 Volume: (cm) 0.05 % Reduction in Area: 68% % Reduction in Volume: 36.7% Epithelialization: None Tunneling: No Undermining: No Wound Description Classification: Full Thickness With Exposed Support Structures Wound Margin: Distinct, outline attached Exudate Amount:  Medium Exudate Type: Serosanguineous Exudate Color: red, brown Foul Odor After Cleansing: No Slough/Fibrino Yes Wound Bed Granulation Amount: None Present (0%) Necrotic Amount: Large (67-100%) Necrotic Quality: Eschar, Adherent Slough Treatment Notes Wound #4 (Ankle) Wound Laterality: Right, Lateral Cleanser Soap and Water Discharge Instruction: May shower and wash wound with dial antibacterial soap and water prior to dressing change. Wound Cleanser Discharge Instruction: Cleanse the wound with wound cleanser prior to applying a clean dressing using gauze sponges, not tissue or cotton balls. Peri-Wound [name] Zinc Oxide Ointment 30g tube Discharge Instruction: Apply Zinc Oxide to periwound with each dressing change Sween Lotion (Moisturizing lotion) Discharge Instruction: Apply moisturizing lotion as directed Topical Primary Dressing Xeroform Occlusive Gauze Dressing, 4x4 in Discharge Instruction: Apply to wound bed as instructed Secondary Dressing Bordered Gauze, 4x4 in Discharge Instruction: Apply over primary dressing as directed. Secured With Compression Wrap Tubi grips size D Compression Stockings Add-Ons Electronic Signature(s) Signed: 01/15/2022 4:41:40 PM By: Erenest Blank Signed: 01/16/2022 3:56:15 PM By: Rhae Hammock RN Entered By: Erenest Blank on 01/15/2022 10:31:52 -------------------------------------------------------------------------------- Wound Assessment Details Patient Name: Date of Service: [name] [name]. 01/15/2022 10:00 A M Medical Record Number: [id number] Patient Account Number: [id number] Date of Birth/Sex:  Treating RN: 1924/08/24 (88 y.o. [name] [name] Primary [name] Caton Popowski: [name] Other Clinician: Referring Janita Camberos: Treating Percy Comp/Extender: [name] in Treatment: 13 Wound Status Wound Number: 6 Primary Venous Leg Ulcer Etiology: Wound Location: Left, Anterior Lower Leg Wound  Open Wounding Event: Blister Status: Date Acquired: 01/15/2022 Comorbid Arrhythmia, Congestive Heart Failure, Coronary Artery Disease, Weeks Of Treatment: 0 History: Hypertension, Peripheral Venous Disease, Osteoarthritis, Seizure Clustered Wound: No Disorder Photos Wound Measurements Length: (cm) 3 Width: (cm) 3.7 Depth: (cm) 0.1 Area: (cm) 8.718 Volume: (cm) 0.872 % Reduction in Area: 0% % Reduction in Volume: 0% Epithelialization: Small (1-33%) Tunneling: No Undermining: No Wound Description Classification: Full Thickness Without Exposed Support Structures Wound Margin: Distinct, outline attached Exudate Amount: Medium Exudate Type: Serosanguineous Exudate Color: red, brown Foul Odor After Cleansing: No Slough/Fibrino Yes Wound Bed Granulation Amount: Large (67-100%) Exposed Structure Granulation Quality: Red Fascia Exposed: No Necrotic Amount: Small (1-33%) Fat Layer (Subcutaneous Tissue) Exposed: Yes Necrotic Quality: Adherent Slough Tendon Exposed: No Muscle Exposed: No Joint Exposed: No Bone Exposed: No Treatment Notes Wound #6 (Lower Leg) Wound Laterality: Left, Anterior Cleanser Soap and Water Discharge Instruction: May shower and wash wound with dial antibacterial soap and water prior to dressing change. Wound Cleanser Discharge Instruction: Cleanse the wound with wound cleanser prior to applying a clean dressing using gauze sponges, not tissue or cotton balls. Peri-Wound [name] Zinc Oxide Ointment 30g tube Discharge Instruction: Apply Zinc Oxide to periwound with each dressing change Sween Lotion (Moisturizing lotion) Discharge Instruction: Apply moisturizing lotion as directed Topical Primary Dressing Xeroform Occlusive Gauze Dressing, 4x4 in Discharge Instruction: Apply to wound bed as instructed Secondary Dressing Bordered Gauze, 4x4 in Discharge Instruction: Apply over primary dressing as directed. Secured With Compression Wrap Tubi grips size  D Compression Stockings Add-Ons Electronic Signature(s) Signed: 01/15/2022 5:11:01 PM By: Adline Peals Entered By: Adline Peals on 01/15/2022 11:08:03 -------------------------------------------------------------------------------- Vitals Details Patient Name: Date of Service: [name] [name]. 01/15/2022 10:00 A M Medical Record Number: [id number] Patient Account Number: [id number] Date of Birth/Sex: Treating RN: [birth year] (88 y.o. [name] [name], [name] [name] Izekiel Flegel: [name] Other Clinician: Referring Barri Neidlinger: Treating Anika Shore/Extender: [name] in Treatment: 13 Vital Signs Time Taken: 10:18 Temperature (F): 97.7 Pulse (bpm): 60 Respiratory Rate (breaths/min): 18 Blood Pressure (mmHg): 143/66 Reference Range: 80 - 120 mg / dl Electronic Signature(s) Signed: 01/15/2022 4:41:40 PM By: Erenest Blank Entered By: Erenest Blank on 01/15/2022 10:19:09

## 2022-01-29 ENCOUNTER — Encounter (HOSPITAL_BASED_OUTPATIENT_CLINIC_OR_DEPARTMENT_OTHER): Payer: Medicare HMO | Admitting: Physician Assistant

## 2022-01-29 DIAGNOSIS — I87331 Chronic venous hypertension (idiopathic) with ulcer and inflammation of right lower extremity: Secondary | ICD-10-CM | POA: Diagnosis not present

## 2022-01-29 DIAGNOSIS — L97318 Non-pressure chronic ulcer of right ankle with other specified severity: Secondary | ICD-10-CM | POA: Diagnosis not present

## 2022-01-29 DIAGNOSIS — L97312 Non-pressure chronic ulcer of right ankle with fat layer exposed: Secondary | ICD-10-CM | POA: Diagnosis not present

## 2022-01-29 DIAGNOSIS — I110 Hypertensive heart disease with heart failure: Secondary | ICD-10-CM | POA: Diagnosis not present

## 2022-01-29 DIAGNOSIS — L97812 Non-pressure chronic ulcer of other part of right lower leg with fat layer exposed: Secondary | ICD-10-CM | POA: Diagnosis not present

## 2022-01-29 DIAGNOSIS — I5042 Chronic combined systolic (congestive) and diastolic (congestive) heart failure: Secondary | ICD-10-CM | POA: Diagnosis not present

## 2022-01-29 DIAGNOSIS — I87332 Chronic venous hypertension (idiopathic) with ulcer and inflammation of left lower extremity: Secondary | ICD-10-CM | POA: Diagnosis not present

## 2022-01-29 DIAGNOSIS — G40909 Epilepsy, unspecified, not intractable, without status epilepticus: Secondary | ICD-10-CM | POA: Diagnosis not present

## 2022-01-29 DIAGNOSIS — L97822 Non-pressure chronic ulcer of other part of left lower leg with fat layer exposed: Secondary | ICD-10-CM | POA: Diagnosis not present

## 2022-01-29 DIAGNOSIS — I87333 Chronic venous hypertension (idiopathic) with ulcer and inflammation of bilateral lower extremity: Secondary | ICD-10-CM | POA: Diagnosis not present

## 2022-01-29 NOTE — Progress Notes (Addendum)
[name] [name], [name] [name] ([id number]) Visit Report for 01/29/2022 Chief Complaint Document Details Patient Name: Date of Service: LEIBISH, MCGREGOR 01/29/2022 10:00 A M Medical Record Number: [id number] Patient Account Number: [id number] Date of Birth/Sex: Treating RN: 1925/03/13 (88 y.o. [name], [name] Primary Care Provider: [name] Other Clinician: Referring Provider: Treating Provider/Extender: [name] in Treatment: 15 Information Obtained from: Patient Chief Complaint Bilateral LE Ulcers Electronic Signature(s) Signed: 01/29/2022 10:31:42 AM By: Worthy Keeler PA-C Entered By: Worthy Keeler on 01/29/2022 10:31:42 -------------------------------------------------------------------------------- HPI Details Patient Name: Date of Service: [name] [name], [name] [name] 01/29/2022 10:00 A M Medical Record Number: [id number] Patient Account Number: [id number] Date of Birth/Sex: Treating RN: 1924-11-25 (88 y.o. [name], [name] Primary Care Provider: [name] Other Clinician: Referring Provider: Treating Provider/Extender: [name] in Treatment: 15 History of Present Illness HPI Description: ADMISSION 04/02/2021 This is a pleasant 88 year old man sent to our clinic by Dr. Lyndel Safe from the skilled unit of [facility]. He has a wound on the right posterior calf. I am uncertain about the duration of this however it looks to be chronic. They are using Santyl covered with silver alginate he does not have any compression. The wound is 100% covered in very fibrinous adherent slough. It also appears that this is quite painful. The patient is exceptionally hard of hearing so it is difficult to get any additional history out of him. Past medical history includes hypertension, coronary artery disease, congestive heart failure, BPH, seizure disorder. He is nonambulatory for reasons that are not totally clear. He lives at  the [facility] unit of [facility]. ABI in our clinic was 1.03 on the right 11/2; 2-week follow-up. He is having the dressing changed at the facility. He arrives in the clinic with the area about the same in terms of surface area but better looking surface we have been using Iodoflex under 3 layer compression. Unfortunately we still do not have any history of exactly how long this is been there or how it happened. 11/16; 2-week follow-up. Nice improvement in the wound. He is using Iodoflex over 3 layer compression. 11/30 2-week follow-up. [facility] is at the [facility] [place]. We have been using Iodoflex under 3 layer compression. They are not wrapping his leg properly but nevertheless the wound area has come down nicely. The patient requests coming here every 3 weeks instead of every 2 12/21; the area on the posterior right calf is fully epithelialized. The patient has chronic venous insufficiency. Readmission: 10-16-2021 upon evaluation today patient appears to be doing somewhat poorly in regard to the wounds on the bilateral lower extremities. He has been tolerating the dressing changes without complication. Fortunately there does not appear to be any evidence of active infection locally or systemically which is great news. Overall I am extremely pleased with where things stand. With that being said that obviously he has quite a bit of swelling and this is led to several areas of significant blistering over his bilateral lower extremities all the wounds are superficial this is the good news. Patient continues to have issues with chronic venous hypertension bilaterally with ulcerations. He also has a history of hypertension and congestive heart failure. He previously tolerated 3 layer compression wraps without complication. He was last seen and discharged in our office June 05, 2021. 10-23-2021 upon evaluation today patient appears to be doing excellent in regard to his legs. They  do look better although  there is a lot of bright green drainage I do believe this likely represents a Pseudomonas infection and that was discussed with him as best I could today again he is extremely hard of hearing. I had to write most of what I wanted him to know down. 10-30-2021 upon evaluation 20 today patient appears to be doing much better in regard to his leg ulcers. He has been taking the Levaquin without complication and overall very pleased in that regard. Fortunately I do not see any evidence of infection locally or systemically which is great news. No fevers, chills, nausea, vomiting, or diarrhea. 11-06-2021 upon evaluation today patient appears to be doing excellent in regard to his wounds. In fact the left leg is almost completely healed the right leg is significantly smaller. Overall I do believe we are on the right track here. 6/7; 2-week follow-up. The patient's left leg is essentially closed. His superficial areas anteriorly look like they are doing very well with nice rims of epithelialization. UNFORTUNATELY he has a new wound on the right lateral malleolus the exact etiology of this is not really clear but he is mostly complaining of this wound. We have been using Xeroform under kerlix Coban The patient is in the [facility]. I am uncertain whether he has 20/30 below-knee stockings or not but he is definitely going to need them. 12-04-2021 upon evaluation today patient appears to be doing better in regard to his wound especially on the left side of the lower extremity although the right side he has a new blister healing of the other wounds are very close to complete closure. This is due to it not being wrapped appropriately from the knee down to the base of the toes. This has been in the orders for the past several visits and to be honest I am not really sure why its not being done as ordered. 12-18-2021 upon evaluation today patient appears to be doing  well currently in regard to his wounds. In fact he is showing signs of excellent improvement which is great news and in fact is only the right ankle which is still open. Overall everything else is doing awesome. I do not see any evidence of active infection locally or systemically at this time. 01-01-2022 upon evaluation today patient appears to be doing well with regard to his wound. He is overall showing signs of excellent improvement which is great news. Fortunately I do not see any evidence of active infection locally or systemically at this time. 01-15-2022 upon evaluation today patient unfortunately is extremely swollen on the left leg and has a new wound which is open at this point. Again I discussed with the patient that this is quite unfortunate especially in light of the fact that he is previously noted to have been completely healed when I saw him 2 weeks ago. Again I believe this is likely open due to the fact that he has not had impression on this leg the facility was supposed to have already ordered and started the compression socks I am not sure that ever happened but also is possible he may have been pushing the compression socks down I really do not know as the facility has not contacted [place] to relay any information whatsoever. 01-29-2022 upon evaluation today patient appears to be doing worse in regard to his legs he is not getting the compression that he needs at this point. Fortunately I do not see any signs of active infection locally  or systemically at this time. Electronic Signature(s) Signed: 01/29/2022 11:13:44 AM By: Worthy Keeler PA-C Entered By: Worthy Keeler on 01/29/2022 11:13:44 -------------------------------------------------------------------------------- Physical Exam Details Patient Name: Date of Service: [name] [name], [name] [name] 01/29/2022 10:00 A M Medical Record Number: [id number] Patient Account Number: [id number] Date of Birth/Sex: Treating RN: August 24, 1924 (88  y.o. [name], [name] Primary Care Provider: [name] Other Clinician: Referring Provider: Treating Provider/Extender: Patricia Nettle, Ihor Gully in Treatment: 62 Constitutional Well-nourished and well-hydrated in no acute distress. Respiratory normal breathing without difficulty. Psychiatric this patient is able to make decisions and demonstrates good insight into disease process. Alert and Oriented x 3. pleasant and cooperative. Notes Upon inspection patient's wounds are not doing too well unfortunately. I do not see any evidence of infection I do think the patient is continue to have some pretty significant issues here. I did obtain a culture for examination I Georgina Peer place him on Cipro. Electronic Signature(s) Signed: 01/29/2022 11:17:00 AM By: Worthy Keeler PA-C Entered By: Worthy Keeler on 01/29/2022 11:14:21 -------------------------------------------------------------------------------- Physician Orders Details Patient Name: Date of Service: BIRUK, TROIA 01/29/2022 10:00 A M Medical Record Number: [id number] Patient Account Number: [id number] Date of Birth/Sex: Treating RN: 05/17/1925 (88 y.o. 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 - 02/05/22 @ 1000 w/ Jeri Cos and Lauran Rm # 9 Return A Other: - We are changing orders back to both legs being wrapped with kerlix and coban. Xeroform to all wounds. T be changed 3 x a week. o ***PLEASE WRAP THE LEGS WITH THE KERLIX and COBAN FROM BOTTOM OF TOES TO JUST BELOW BEND OF KNEE. WE HAVE MADE MARKS ON BOTH LEGS OF WHERE TO START THE WRAP AND WHERE TO END*** Bathing/ Shower/ Hygiene May shower with protection but do not get wound dressing(s) wet. Edema Control - Lymphedema / SCD / Other Elevate legs to the level of the heart or above for 30 minutes daily and/or when sitting, a frequency of: Avoid standing for long periods of time. Wound Treatment Wound #4 - Ankle Wound Laterality: Right, Lateral Cleanser: Soap and Water 3 x Per Week/15 Days Discharge Instructions: May shower and wash wound with dial antibacterial soap and water prior to dressing change. Cleanser: Wound Cleanser 3 x Per Week/15 Days Discharge Instructions: Cleanse the wound with wound cleanser prior to applying a clean dressing using gauze sponges, not tissue or cotton balls. Peri-Wound Care: Zinc Oxide Ointment 30g tube 3 x Per Week/15 Days Discharge Instructions: Apply Zinc Oxide to periwound with each dressing change Peri-Wound Care: Sween Lotion (Moisturizing lotion) 3 x Per Week/15 Days Discharge Instructions: Apply moisturizing lotion as directed Prim Dressing: Xeroform Occlusive Gauze Dressing, 4x4 in 3 x Per Week/15 Days ary Discharge Instructions: Apply to wound bed as instructed Secondary Dressing: ABD Pad, 5x9 3 x  Per Week/15 Days Discharge Instructions: Apply over primary dressing as directed. Compression Wrap: Kerlix Roll 4.5x3.1 (in/yd) 3 x Per Week/15 Days Discharge Instructions: Apply Kerlix and Coban compression as directed. Compression Wrap: Coban Self-Adherent Wrap  4x5 (in/yd) 3 x Per Week/15 Days Discharge Instructions: Apply over Kerlix as directed. Wound #6 - Lower Leg Wound Laterality: Left, Anterior Cleanser: Soap and Water 3 x Per Week/15 Days Discharge Instructions: May shower and wash wound with dial antibacterial soap and water prior to dressing change. Cleanser: Wound Cleanser 3 x Per Week/15 Days Discharge Instructions: Cleanse the wound with wound cleanser prior to applying a clean dressing using gauze sponges, not tissue or cotton balls. Peri-Wound Care: Zinc Oxide Ointment 30g tube 3 x Per Week/15 Days Discharge Instructions: Apply Zinc Oxide to periwound with each dressing change Peri-Wound Care: Sween Lotion (Moisturizing lotion) 3 x Per Week/15 Days Discharge Instructions: Apply moisturizing lotion as directed Prim Dressing: Xeroform Occlusive Gauze Dressing, 4x4 in 3 x Per Week/15 Days ary Discharge Instructions: Apply to wound bed as instructed Secondary Dressing: ABD Pad, 5x9 3 x Per Week/15 Days Discharge Instructions: Apply over primary dressing as directed. Compression Wrap: Kerlix Roll 4.5x3.1 (in/yd) 3 x Per Week/15 Days Discharge Instructions: Apply Kerlix and Coban compression as directed. Compression Wrap: Coban Self-Adherent Wrap 4x5 (in/yd) 3 x Per Week/15 Days Discharge Instructions: Apply over Kerlix as directed. Wound #7 - Lower Leg Wound Laterality: Left, Lateral Cleanser: Soap and Water 3 x Per Week/15 Days Discharge Instructions: May shower and wash wound with dial antibacterial soap and water prior to dressing change. Cleanser: Wound Cleanser 3 x Per Week/15 Days Discharge Instructions: Cleanse the wound with wound cleanser prior to applying a clean dressing using gauze sponges, not tissue or cotton balls. Peri-Wound Care: Zinc Oxide Ointment 30g tube 3 x Per Week/15 Days Discharge Instructions: Apply Zinc Oxide to periwound with each dressing change Peri-Wound Care: Sween Lotion (Moisturizing lotion) 3 x Per  Week/15 Days Discharge Instructions: Apply moisturizing lotion as directed Prim Dressing: Xeroform Occlusive Gauze Dressing, 4x4 in 3 x Per Week/15 Days ary Discharge Instructions: Apply to wound bed as instructed Secondary Dressing: ABD Pad, 5x9 3 x Per Week/15 Days Discharge Instructions: Apply over primary dressing as directed. Compression Wrap: Kerlix Roll 4.5x3.1 (in/yd) 3 x Per Week/15 Days Discharge Instructions: Apply Kerlix and Coban compression as directed. Compression Wrap: Coban Self-Adherent Wrap 4x5 (in/yd) 3 x Per Week/15 Days Discharge Instructions: Apply over Kerlix as directed. Patient Medications llergies: No Known Allergies A Notifications Medication Indication Start End 01/29/2022 Cipro DOSE 1 - oral 500 mg tablet - 1 tablet oral taken 2 times per day for 14 days Electronic Signature(s) Signed: 01/29/2022 11:17:00 AM By: Worthy Keeler PA-C Entered By: Worthy Keeler on 01/29/2022 11:15:34 Prescription 01/29/2022 -------------------------------------------------------------------------------- Billie Lade PA Patient Name: Provider: May 16, 1925 [id number] Date of Birth: NPI#: [id number] Sex: DEA #: [phone] Phone #: License #: [name] Patient Address: [address], [place] [address] [place], [place] [address] [phone] Allergies No Known Allergies Medication Medication: Route: Strength: Form: Cipro oral 500 mg tablet Class: QUINOLONE ANTIBIOTICS Dose: Frequency / Time: Indication: 1 1 tablet oral taken 2 times per day for 14 days Number of Refills: Number of Units: 0 Twenty Eight (28) Tablet(s) Generic Substitution: Start Date: End Date: Administered at Facility: Substitution Permitted 06/18/7251 No Note to Pharmacy: Hand Signature: Date(s): Electronic Signature(s) Signed:  01/29/2022 11:17:00 AM By: Worthy Keeler  PA-C Entered By: Worthy Keeler on 01/29/2022 11:15:35 -------------------------------------------------------------------------------- Problem List Details Patient Name: Date of Service: [name] [name], [name] [name] 01/29/2022 10:00 A M Medical Record Number: [id number] Patient Account Number: [id number] Date of Birth/Sex: Treating RN: 1924-06-23 (88 y.o. 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 11/20/2021 No Yes L97.812 Non-pressure chronic ulcer of other part of right lower leg with fat layer 10/16/2021 No Yes exposed [place] (primary) hypertension 10/16/2021 No Yes I50.42 Chronic combined systolic (congestive) and diastolic (congestive) heart failure 10/16/2021 No Yes Inactive Problems Resolved Problems Electronic Signature(s) Signed: 01/29/2022 10:31:36 AM By: Worthy Keeler PA-C Entered By: Worthy Keeler on 01/29/2022 10:31:36 -------------------------------------------------------------------------------- Progress Note Details Patient Name: Date of Service: [name]. 01/29/2022 10:00 A M Medical Record Number: [id number] Patient Account Number: [id number] Date of Birth/Sex: Treating RN: [birth year] (88 y.o. [name], [name] Primary Care Provider: [name] Other Clinician: Referring Provider: Treating Provider/Extender: [name] in Treatment: 15 Subjective Chief Complaint Information obtained from Patient Bilateral LE Ulcers History of Present Illness (HPI) ADMISSION 04/02/2021 This is a pleasant 88 year old man sent to our clinic by Dr. Lyndel Safe from the [facility] unit of [facility]. He has a wound on the right posterior calf. I am uncertain about the duration of this however it looks to be chronic. They are using Santyl covered with silver alginate he does not have any compression. The wound is 100% covered in very fibrinous adherent slough. It also appears that this is quite painful. The patient is exceptionally hard of hearing so it is difficult to get any additional history out of him. Past medical history includes hypertension, coronary artery disease, congestive heart failure, BPH, seizure disorder. He is nonambulatory for reasons that are not totally clear. He lives at the [facility] unit of [facility]. ABI in our clinic was 1.03 on the right 11/2; 2-week follow-up. He is having the dressing changed at the facility. He arrives in the clinic with the area about the same in terms of surface area but better looking surface we have been using Iodoflex under 3 layer compression. Unfortunately we still do not have any history of exactly how long this is been there or how it happened. 11/16; 2-week follow-up. Nice improvement in the wound. He is using Iodoflex over 3 layer compression. 11/30 2-week follow-up. [facility] is at the [facility] [place]. We have been using Iodoflex under 3 layer compression. They are not wrapping his leg properly but nevertheless the wound area has come down nicely. The patient requests coming here every 3 weeks instead of every 2 12/21; the area on the posterior right calf is fully epithelialized. The patient has chronic venous insufficiency. Readmission: 10-16-2021 upon evaluation today patient appears to be doing somewhat poorly in regard  to the wounds on the bilateral lower extremities. He has been tolerating the dressing changes without complication. Fortunately there does not appear  to be any evidence of active infection locally or systemically which is great news. Overall I am extremely pleased with where things stand. With that being said that obviously he has quite a bit of swelling and this is led to several areas of significant blistering over his bilateral lower extremities all the wounds are superficial this is the good news. Patient continues to have issues with chronic venous hypertension bilaterally with ulcerations. He also has a history of hypertension and congestive heart failure. He previously tolerated 3 layer compression wraps without complication. He was last seen and discharged in our office June 05, 2021. 10-23-2021 upon evaluation today patient appears to be doing excellent in regard to his legs. They do look better although there is a lot of bright green drainage I do believe this likely represents a Pseudomonas infection and that was discussed with him as best I could today again he is extremely hard of hearing. I had to write most of what I wanted him to know down. 10-30-2021 upon evaluation 20 today patient appears to be doing much better in regard to his leg ulcers. He has been taking the Levaquin without complication and overall very pleased in that regard. Fortunately I do not see any evidence of infection locally or systemically which is great news. No fevers, chills, nausea, vomiting, or diarrhea. 11-06-2021 upon evaluation today patient appears to be doing excellent in regard to his wounds. In fact the left leg is almost completely healed the right leg is significantly smaller. Overall I do believe we are on the right track here. 6/7; 2-week follow-up. The patient's left leg is essentially closed. His superficial areas anteriorly look like they are doing very well with nice rims of epithelialization.  UNFORTUNATELY he has a new wound on the right lateral malleolus the exact etiology of this is not really clear but he is mostly complaining of this wound. We have been using Xeroform under kerlix Coban The patient is in the [facility]. I am uncertain whether he has 20/30 below-knee stockings or not but he is definitely going to need them. 12-04-2021 upon evaluation today patient appears to be doing better in regard to his wound especially on the left side of the lower extremity although the right side he has a new blister healing of the other wounds are very close to complete closure. This is due to it not being wrapped appropriately from the knee down to the base of the toes. This has been in the orders for the past several visits and to be honest I am not really sure why its not being done as ordered. 12-18-2021 upon evaluation today patient appears to be doing well currently in regard to his wounds. In fact he is showing signs of excellent improvement which is great news and in fact is only the right ankle which is still open. Overall everything else is doing awesome. I do not see any evidence of active infection locally or systemically at this time. 01-01-2022 upon evaluation today patient appears to be doing well with regard to his wound. He is overall showing signs of excellent improvement which is great news. Fortunately I do not see any evidence of active infection locally or systemically at this time. 01-15-2022 upon evaluation today patient unfortunately is extremely swollen on the left leg and has a new wound which is open at this point. Again I discussed with the patient that this is quite unfortunate especially in light of the fact  that he is previously noted to have been completely healed when I saw him 2 weeks ago. Again I believe this is likely open due to the fact that he has not had impression on this leg the facility was supposed to have already ordered and  started the compression socks I am not sure that ever happened but also is possible he may have been pushing the compression socks down I really do not know as the facility has not contacted [place] to relay any information whatsoever. 01-29-2022 upon evaluation today patient appears to be doing worse in regard to his legs he is not getting the compression that he needs at this point. Fortunately I do not see any signs of active infection locally or systemically at this time. Objective Constitutional Well-nourished and well-hydrated in no acute distress. Vitals Time Taken: 10:17 AM, Temperature: 98.1 F, Pulse: 71 bpm, Respiratory Rate: 20 breaths/min, Blood Pressure: 125/56 mmHg. Respiratory normal breathing without difficulty. Psychiatric this patient is able to make decisions and demonstrates good insight into disease process. Alert and Oriented x 3. pleasant and cooperative. General Notes: Upon inspection patient's wounds are not doing too well unfortunately. I do not see any evidence of infection I do think the patient is continue to have some pretty significant issues here. I did obtain a culture for examination I Georgina Peer place him on Cipro. Integumentary (Hair, Skin) Wound #4 status is Open. Original cause of wound was Gradually Appeared. The date acquired was: 11/20/2021. The wound has been in treatment 10 weeks. The wound is located on the Right,Lateral Ankle. The wound measures 0.4cm length x 0.9cm width x 0.2cm depth; 0.283cm^2 area and 0.057cm^3 volume. There is no tunneling or undermining noted. There is a medium amount of serosanguineous drainage noted. The wound margin is distinct with the outline attached to the wound base. There is no granulation within the wound bed. There is a large (67-100%) amount of necrotic tissue within the wound bed including Eschar and Adherent Slough. Wound #6 status is Open. Original cause of wound was Blister. The date acquired was: 01/15/2022. The wound has  been in treatment 2 weeks. The wound is located on the Left,Anterior Lower Leg. The wound measures 2.3cm length x 2cm width x 0.1cm depth; 3.613cm^2 area and 0.361cm^3 volume. There is Fat Layer (Subcutaneous Tissue) exposed. There is no tunneling or undermining noted. There is a medium amount of serosanguineous drainage noted. The wound margin is distinct with the outline attached to the wound base. There is small (1-33%) red granulation within the wound bed. There is a large (67-100%) amount of necrotic tissue within the wound bed including Adherent Slough. Wound #7 status is Open. Original cause of wound was Skin T ear/Laceration. The date acquired was: 01/17/2022. The wound is located on the Left,Lateral Lower Leg. The wound measures 7cm length x 3cm width x 0.1cm depth; 16.493cm^2 area and 1.649cm^3 volume. There is Fat Layer (Subcutaneous Tissue) exposed. There is no tunneling or undermining noted. There is a medium amount of serosanguineous drainage noted. The wound margin is distinct with the outline attached to the wound base. There is small (1-33%) red granulation within the wound bed. There is a large (67-100%) amount of necrotic tissue within the wound bed including Adherent Slough. 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 - 02/05/22 @ 1000 w/ Jeri Cos and Lauran Rm # 9 Other: - We are changing orders  back to both legs being wrapped with kerlix and coban. Xeroform to all wounds. T be changed 3 x a week. ***PLEASE WRAP o THE LEGS WITH THE KERLIX and COBAN FROM BOTTOM OF TOES TO JUST BELOW BEND OF KNEE. WE HAVE MADE MARKS ON BOTH LEGS OF WHERE TO START THE WRAP AND WHERE TO END*** Bathing/ Shower/ Hygiene: May shower with protection but do not get wound dressing(s) wet. Edema Control - Lymphedema / SCD / Other: Elevate legs to the level of the heart or above for 30 minutes daily and/or when sitting, a frequency of: Avoid standing for long periods of time. The following medication(s) was prescribed: Cipro oral 500 mg tablet 1 1 tablet oral taken 2 times per day for 14 days starting 01/29/2022 WOUND #4: - Ankle Wound Laterality: Right, Lateral Cleanser: Soap and Water 3 x Per Week/15 Days Discharge Instructions: May shower and wash wound with dial antibacterial soap and water prior to dressing change. Cleanser: Wound Cleanser 3 x Per Week/15 Days Discharge Instructions: Cleanse the wound with wound cleanser prior to applying a clean dressing using gauze sponges, not tissue or cotton balls. Peri-Wound Care: Zinc Oxide Ointment 30g tube 3 x Per Week/15 Days Discharge Instructions: Apply Zinc Oxide to periwound with each dressing change Peri-Wound Care: Sween Lotion (Moisturizing lotion) 3 x Per Week/15 Days Discharge Instructions: Apply moisturizing lotion as directed Prim Dressing: Xeroform Occlusive Gauze Dressing, 4x4 in 3 x Per Week/15 Days ary Discharge Instructions: Apply to wound bed as instructed Secondary Dressing: ABD Pad, 5x9 3 x Per Week/15 Days Discharge Instructions: Apply over primary dressing as directed. Com pression Wrap: Kerlix Roll 4.5x3.1 (in/yd) 3 x Per Week/15 Days Discharge Instructions: Apply Kerlix and Coban compression as directed. Com pression Wrap: Coban Self-Adherent Wrap 4x5 (in/yd) 3 x Per Week/15 Days Discharge Instructions: Apply over Kerlix as  directed. WOUND #6: - Lower Leg Wound Laterality: Left, Anterior Cleanser: Soap and Water 3 x Per Week/15 Days Discharge Instructions: May shower and wash wound with dial antibacterial soap and water prior to dressing change. Cleanser: Wound Cleanser 3 x Per Week/15 Days Discharge Instructions: Cleanse the wound with wound cleanser prior to applying a clean dressing using gauze sponges, not tissue or cotton balls. Peri-Wound Care: Zinc Oxide Ointment 30g tube 3 x Per Week/15 Days Discharge Instructions: Apply Zinc Oxide to periwound with each dressing change Peri-Wound Care: Sween Lotion (Moisturizing lotion) 3 x Per Week/15 Days Discharge Instructions: Apply moisturizing lotion as directed Prim Dressing: Xeroform Occlusive Gauze Dressing, 4x4 in 3 x Per Week/15 Days ary Discharge Instructions: Apply to wound bed as instructed Secondary Dressing: ABD Pad, 5x9 3 x Per Week/15 Days Discharge Instructions: Apply over primary dressing as directed. Com pression Wrap: Kerlix Roll 4.5x3.1 (in/yd) 3 x Per Week/15 Days Discharge Instructions: Apply Kerlix and Coban compression as directed. Com pression Wrap: Coban Self-Adherent Wrap 4x5 (in/yd) 3 x Per Week/15 Days Discharge Instructions: Apply over Kerlix as directed. WOUND #7: - Lower Leg Wound Laterality: Left, Lateral Cleanser: Soap and Water 3 x Per Week/15 Days Discharge Instructions: May shower and wash wound with dial antibacterial soap and water prior  to dressing change. Cleanser: Wound Cleanser 3 x Per Week/15 Days Discharge Instructions: Cleanse the wound with wound cleanser prior to applying a clean dressing using gauze sponges, not tissue or cotton balls. Peri-Wound Care: Zinc Oxide Ointment 30g tube 3 x Per Week/15 Days Discharge Instructions: Apply Zinc Oxide to periwound with each dressing change Peri-Wound Care: Sween Lotion (Moisturizing lotion) 3 x Per Week/15 Days Discharge Instructions: Apply moisturizing lotion as  directed Prim Dressing: Xeroform Occlusive Gauze Dressing, 4x4 in 3 x Per Week/15 Days ary Discharge Instructions: Apply to wound bed as instructed Secondary Dressing: ABD Pad, 5x9 3 x Per Week/15 Days Discharge Instructions: Apply over primary dressing as directed. Com pression Wrap: Kerlix Roll 4.5x3.1 (in/yd) 3 x Per Week/15 Days Discharge Instructions: Apply Kerlix and Coban compression as directed. Com pression Wrap: Coban Self-Adherent Wrap 4x5 (in/yd) 3 x Per Week/15 Days Discharge Instructions: Apply over Kerlix as directed. 1. I would recommend that we go ahead and initiate treatment with a prescription for Cipro. I think this is can be a good option the patient based on what I am seeing I do believe he likely has Pseudomonas as the primary organism of concern here with the blue-green drainage that he is experiencing. 2. I am also going to recommend that we have the patient continue to monitor and be monitored for any signs of worsening or infection. He needs to have the Curlex and Coban wrap on it needs to go from the base of the toes to the top of the lower leg just below the knee I did actually put marks on his legs where the wrap needs to be up to and we will get a put this in the orders as well. I am not sure what else to do other than this to be honest. This needs to be done very specifically and on a regular basis here. 3. I would recommend we see him back in short order in fact next week to see where things stand. We will see patient back for reevaluation in 1 week here in the clinic. If anything worsens or changes patient will contact our office for additional recommendations. Electronic Signature(s) Signed: 01/29/2022 11:15:51 AM By: Worthy Keeler PA-C Entered By: Worthy Keeler on 01/29/2022 11:15:50 -------------------------------------------------------------------------------- SuperBill Details Patient Name: Date of Service: [name] [name], [name] [name] 01/29/2022 Medical  Record Number: [id number] Patient Account Number: [id number] Date of Birth/Sex: Treating RN: March 03, 1925 (88 y.o. 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 L97.812 Non-pressure chronic ulcer of other part of right lower leg with fat layer exposed I10 Essential (primary) hypertension I50.42 Chronic combined systolic (congestive) and diastolic (congestive) heart failure Facility Procedures CPT4 Code: [id number] Description: [phone] - WOUND CARE VISIT-LEV 5 EST PT Modifier: Quantity: 1 Physician Procedures : CPT4 Code Description Modifier [id number] - WC PHYS LEVEL 4 - EST PT ICD-10 Diagnosis Description I87.331 Chronic venous hypertension (idiopathic) with ulcer and inflammation of right lower extremity I87.332 Chronic venous hypertension (idiopathic)  with ulcer and inflammation of left lower extremity L97.822 Non-pressure chronic ulcer of other part of  left lower leg with fat layer exposed L97.318 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

## 2022-02-03 LAB — AEROBIC/ANAEROBIC CULTURE W GRAM STAIN (SURGICAL/DEEP WOUND): Gram Stain: NONE SEEN

## 2022-02-04 NOTE — Progress Notes (Signed)
CAP, MASSI ([id number]) Visit Report for 01/29/2022 Arrival Information Details Patient Name: Date of Service: [name] [name], [name] [name] 01/29/2022 10:00 A M Medical Record Number: [id number] Patient Account Number: [id number] Date of Birth/Sex: Treating RN: 1924-12-31 (88 y.o. [name], [name] Primary Care Jeshua Ransford: [name] Other Clinician: Referring Yanin Muhlestein: Treating Nikira Kushnir/Extender: [name] in Treatment: 15 Visit Information History Since Last Visit Added or deleted any medications: No Patient Arrived: Wheel Chair Any new allergies or adverse reactions: No Arrival Time: 10:17 Had a fall or experienced change in No Accompanied By: self activities of daily living that may affect Transfer Assistance: None risk of falls: Patient Identification Verified: Yes Signs or symptoms of abuse/neglect since last visito No Secondary Verification Process Completed: Yes Hospitalized since last visit: No Patient Requires Transmission-Based Precautions: No Implantable device outside of the clinic excluding No Patient Has Alerts: Yes cellular tissue based products placed in the center Patient Alerts: ABI's 05/23 R: N/C since last visit: Has Dressing in Place as Prescribed: Yes Pain Present Now: No Electronic Signature(s) Signed: 01/29/2022 4:57:18 PM By: Erenest Blank Entered By: Erenest Blank on 01/29/2022 10:17:33 -------------------------------------------------------------------------------- Clinic Level of Care Assessment Details Patient Name: Date of Service: [name], [name] 01/29/2022 10:00 A M Medical Record Number: [id number] Patient Account Number: [id number] Date of Birth/Sex: Treating RN: 1925/04/28 (88 y.o. [name], [name] Primary Care Han Vejar: [name] Other Clinician: Referring Sakoya Win: Treating Alexus Galka/Extender: [name] in Treatment: 15 Clinic Level of Care Assessment Items TOOL  4 Quantity Score X- 1 0 Use when only an EandM is performed on FOLLOW-UP visit ASSESSMENTS - Nursing Assessment / Reassessment X- 1 10 Reassessment of Co-morbidities (includes updates in patient status) X- 1 5 Reassessment of Adherence to Treatment Plan ASSESSMENTS - Wound and Skin A ssessment / Reassessment '[]'$  - 0 Simple Wound Assessment / Reassessment - one wound X- 3 5 Complex Wound Assessment / Reassessment - multiple wounds '[]'$  - 0 Dermatologic / Skin Assessment (not related to wound area) ASSESSMENTS - Focused Assessment X- 2 5 Circumferential Edema Measurements - multi extremities '[]'$  - 0 Nutritional Assessment / Counseling / Intervention '[]'$  - 0 Lower Extremity Assessment (monofilament, tuning fork, pulses) '[]'$  - 0 Peripheral Arterial Disease Assessment (using hand held doppler) ASSESSMENTS - Ostomy and/or Continence Assessment and Care '[]'$  - 0 Incontinence Assessment and Management '[]'$  - 0 Ostomy Care Assessment and Management (repouching, etc.) PROCESS - Coordination of Care '[]'$  - 0 Simple Patient / Family Education for ongoing care X- 1 20 Complex (extensive) Patient / Family Education for ongoing care X- 1 10 Staff obtains Programmer, systems, Records, T Results / Process Orders est '[]'$  - 0 Staff telephones HHA, Nursing Homes / Clarify orders / etc '[]'$  - 0 Routine Transfer to another Facility (non-emergent condition) '[]'$  - 0 Routine Hospital Admission (non-emergent condition) '[]'$  - 0 New Admissions / Biomedical engineer / Ordering NPWT Apligraf, etc. , '[]'$  - 0 Emergency Hospital Admission (emergent condition) '[]'$  - 0 Simple Discharge Coordination X- 1 15 Complex (extensive) Discharge Coordination PROCESS - Special Needs '[]'$  - 0 Pediatric / Minor Patient Management '[]'$  - 0 Isolation Patient Management '[]'$  - 0 Hearing / Language / Visual special needs '[]'$  - 0 Assessment of Community assistance (transportation, D/C planning, etc.) '[]'$  - 0 Additional assistance / Altered  mentation '[]'$  - 0 Support Surface(s) Assessment (bed, cushion, seat, etc.) INTERVENTIONS - Wound Cleansing / Measurement '[]'$  - 0 Simple Wound Cleansing - one wound X- 3 5  Complex Wound Cleansing - multiple wounds X- 1 5 Wound Imaging (photographs - any number of wounds) '[]'$  - 0 Wound Tracing (instead of photographs) '[]'$  - 0 Simple Wound Measurement - one wound X- 3 5 Complex Wound Measurement - multiple wounds INTERVENTIONS - Wound Dressings '[]'$  - 0 Small Wound Dressing one or multiple wounds X- 3 15 Medium Wound Dressing one or multiple wounds '[]'$  - 0 Large Wound Dressing one or multiple wounds X- 1 5 Application of Medications - topical '[]'$  - 0 Application of Medications - injection INTERVENTIONS - Miscellaneous '[]'$  - 0 External ear exam '[]'$  - 0 Specimen Collection (cultures, biopsies, blood, body fluids, etc.) '[]'$  - 0 Specimen(s) / Culture(s) sent or taken to Lab for analysis X- 1 10 Patient Transfer (multiple staff / Civil Service fast streamer / Similar devices) '[]'$  - 0 Simple Staple / Suture removal (25 or less) '[]'$  - 0 Complex Staple / Suture removal (26 or more) '[]'$  - 0 Hypo / Hyperglycemic Management (close monitor of Blood Glucose) '[]'$  - 0 Ankle / Brachial Index (ABI) - do not check if billed separately X- 1 5 Vital Signs Has the patient been seen at the hospital within the last three years: Yes Total Score: 185 Level Of Care: New/Established - Level 5 Electronic Signature(s) Signed: 02/04/2022 3:41:21 PM By: Rhae Hammock RN Entered By: Rhae Hammock on 01/29/2022 13:26:45 -------------------------------------------------------------------------------- Encounter Discharge Information Details Patient Name: Date of Service: [name] [name]. 01/29/2022 10:00 A M Medical Record Number: [id number] Patient Account Number: [id number] Date of Birth/Sex: Treating RN: [birth year] (88 y.o. [name], [name] Primary Care Prentice Sackrider: [name] Other Clinician: Referring  Thunder Bridgewater: Treating Zylpha Poynor/Extender: [name] in Treatment: 15 Encounter Discharge Information Items Discharge Condition: Stable Ambulatory Status: Wheelchair Discharge Destination: Home Transportation: Private Auto Accompanied By: self Schedule Follow-up Appointment: Yes Clinical Summary of Care: Patient Declined Electronic Signature(s) Signed: 02/04/2022 3:41:21 PM By: Rhae Hammock RN Entered By: Rhae Hammock on 01/29/2022 12:46:48 -------------------------------------------------------------------------------- Lower Extremity Assessment Details Patient Name: Date of Service: [name] [name]. 01/29/2022 10:00 A M Medical Record Number: [id number] Patient Account Number: [id number] Date of Birth/Sex: Treating RN: 06/15/1925 (88 y.o. [name], [name] Primary Care Paola Flynt: [name] Other Clinician: Referring Eyden Dobie: Treating Caiden Monsivais/Extender: Patricia Nettle, Meredith Staggers Weeks in Treatment: 15 Edema Assessment Assessed: Shirlyn Goltz: No] Patrice Paradise: No] Edema: [Left: Yes] [Right: Yes] Calf Left: Right: Point of Measurement: 34 cm From Medial Instep 38 cm 38.7 cm Ankle Left: Right: Point of Measurement: 9 cm From Medial Instep 24.3 cm 23 cm Electronic Signature(s) Signed: 01/29/2022 4:57:18 PM By: Erenest Blank Signed: 02/04/2022 3:41:21 PM By: Rhae Hammock RN Entered By: Erenest Blank on 01/29/2022 10:32:38 -------------------------------------------------------------------------------- Multi-Disciplinary Care Plan Details Patient Name: Date of Service: [name] [name]. 01/29/2022 10:00 A M Medical Record Number: [id number] Patient Account Number: [id number] Date of Birth/Sex: Treating RN: [birth year] (88 y.o. [name], [name] Primary Care Naviyah Schaffert: [name] Other Clinician: Referring Kayleanna Lorman: Treating Raequan Vanschaick/Extender: [name] in Treatment: 15 Active Inactive Wound/Skin  Impairment Nursing Diagnoses: Impaired tissue integrity Knowledge deficit related to ulceration/compromised skin integrity Goals: Patient will have a decrease in wound volume by X% from date: (specify in notes) Date Initiated: 10/16/2021 Target Resolution Date: 02/14/2022 Goal Status: Active Patient/caregiver will verbalize understanding of skin care regimen Date Initiated: 10/16/2021 Target Resolution Date: 02/14/2022 Goal Status: Active Ulcer/skin breakdown will have a volume reduction of 30% by week 4 Date Initiated: 10/16/2021 Target Resolution Date: 02/14/2022 Goal Status:  Active Interventions: Assess patient/caregiver ability to obtain necessary supplies Assess patient/caregiver ability to perform ulcer/skin care regimen upon admission and as needed Assess ulceration(s) every visit Notes: Electronic Signature(s) Signed: 02/04/2022 3:41:21 PM By: Rhae Hammock RN Entered By: Rhae Hammock on 01/29/2022 10:54:33 -------------------------------------------------------------------------------- Pain Assessment Details Patient Name: Date of Service: [name] [name]. 01/29/2022 10:00 A M Medical Record Number: [id number] Patient Account Number: [id number] Date of Birth/Sex: Treating RN: [birth year] (88 y.o. [name], [name] Primary Care Siddiq Kaluzny: [name] Other Clinician: Referring Marketia Stallsmith: Treating Estefanny Moler/Extender: [name] in Treatment: 15 Active Problems Location of Pain Severity and Description of Pain Patient Has Paino No Site Locations Pain Management and Medication Current Pain Management: Electronic Signature(s) Signed: 01/29/2022 4:57:18 PM By: Erenest Blank Signed: 02/04/2022 3:41:21 PM By: Rhae Hammock RN Entered By: Erenest Blank on 01/29/2022 10:19:10 -------------------------------------------------------------------------------- Patient/Caregiver Education Details Patient Name: Date of Service: Vandergriff,  WILLIA M E. 8/16/2023andnbsp10:00 A M Medical Record Number: [id number] Patient Account Number: [id number] Date of Birth/Gender: Treating RN: [birth year] (89 y.o. Erie Noe Primary Care Physician: [name] Other Clinician: Referring Physician: Treating Physician/Extender: [name] in Treatment: 15 Education Assessment Education Provided To: Patient Education Topics Provided Wound/Skin Impairment: Methods: Explain/Verbal Responses: Reinforcements needed, State content correctly Electronic Signature(s) Signed: 02/04/2022 3:41:21 PM By: Rhae Hammock RN Entered By: Rhae Hammock on 01/29/2022 10:54:48 -------------------------------------------------------------------------------- Wound Assessment Details Patient Name: Date of Service: [name] [name]. 01/29/2022 10:00 A M Medical Record Number: [id number] Patient Account Number: [id number] Date of Birth/Sex: Treating RN: March 15, 1925 (88 y.o. [name], [name] Primary Care Traeh Milroy: [name] Other Clinician: Referring Ascencion Stegner: Treating Terryl Molinelli/Extender: [name] in Treatment: 15 Wound Status Wound Number: 4 Primary Venous Leg Ulcer Etiology: Wound Location: Right, Lateral Ankle Wound Open Wounding Event: Gradually Appeared Status: Date Acquired: 11/20/2021 Comorbid Arrhythmia, Congestive Heart Failure, Coronary Artery Disease, Weeks Of Treatment: 10 History: Hypertension, Peripheral Venous Disease, Osteoarthritis, Seizure Clustered Wound: No Disorder Photos Wound Measurements Length: (cm) 0.4 Width: (cm) 0.9 Depth: (cm) 0.2 Area: (cm) 0.283 Volume: (cm) 0.057 % Reduction in Area: 63.9% % Reduction in Volume: 27.8% Epithelialization: Small (1-33%) Tunneling: No Undermining: No Wound Description Classification: Full Thickness With Exposed Support Structures Wound Margin: Distinct, outline attached Exudate Amount:  Medium Exudate Type: Serosanguineous Exudate Color: red, brown Foul Odor After Cleansing: No Slough/Fibrino Yes Wound Bed Granulation Amount: None Present (0%) Necrotic Amount: Large (67-100%) Necrotic Quality: Eschar, Adherent Slough Treatment Notes Wound #4 (Ankle) Wound Laterality: Right, Lateral Cleanser Soap and Water Discharge Instruction: May shower and wash wound with dial antibacterial soap and water prior to dressing change. Wound Cleanser Discharge Instruction: Cleanse the wound with wound cleanser prior to applying a clean dressing using gauze sponges, not tissue or cotton balls. Peri-Wound Care Zinc Oxide Ointment 30g tube Discharge Instruction: Apply Zinc Oxide to periwound with each dressing change Sween Lotion (Moisturizing lotion) Discharge Instruction: Apply moisturizing lotion as directed Topical Primary Dressing Xeroform Occlusive Gauze Dressing, 4x4 in Discharge Instruction: Apply to wound bed as instructed Secondary Dressing ABD Pad, 5x9 Discharge Instruction: Apply over primary dressing as directed. Secured With Compression Wrap Kerlix Roll 4.5x3.1 (in/yd) Discharge Instruction: Apply Kerlix and Coban compression as directed. Coban Self-Adherent Wrap 4x5 (in/yd) Discharge Instruction: Apply over Kerlix as directed. Compression Stockings Add-Ons Electronic Signature(s) Signed: 01/29/2022 4:57:18 PM By: Erenest Blank Signed: 02/04/2022 3:41:21 PM By: Rhae Hammock RN Entered By: Erenest Blank on 01/29/2022 10:36:53 -------------------------------------------------------------------------------- Wound Assessment Details Patient  Name: Date of Service: PATRYCK, KILGORE 01/29/2022 10:00 A M Medical Record Number: [id number] Patient Account Number: [id number] Date of Birth/Sex: Treating RN: [birth year] (88 y.o. [name], [name] Primary Care Helen Cuff: [name] Other Clinician: Referring Dala Breault: Treating Alylah Blakney/Extender: [name] in Treatment: 15 Wound Status Wound Number: 6 Primary Venous Leg Ulcer Etiology: Wound Location: Left, Anterior Lower Leg Wound Open Wounding Event: Blister Status: Date Acquired: 01/15/2022 Comorbid Arrhythmia, Congestive Heart Failure, Coronary Artery Disease, Weeks Of Treatment: 2 History: Hypertension, Peripheral Venous Disease, Osteoarthritis, Seizure Clustered Wound: No Disorder Photos Wound Measurements Length: (cm) 2.3 Width: (cm) 2 Depth: (cm) 0.1 Area: (cm) 3.613 Volume: (cm) 0.361 % Reduction in Area: 58.6% % Reduction in Volume: 58.6% Epithelialization: Small (1-33%) Tunneling: No Undermining: No Wound Description Classification: Full Thickness Without Exposed Support Structures Wound Margin: Distinct, outline attached Exudate Amount: Medium Exudate Type: Serosanguineous Exudate Color: red, brown Foul Odor After Cleansing: No Slough/Fibrino Yes Wound Bed Granulation Amount: Small (1-33%) Exposed Structure Granulation Quality: Red Fascia Exposed: No Necrotic Amount: Large (67-100%) Fat Layer (Subcutaneous Tissue) Exposed: Yes Necrotic Quality: Adherent Slough Tendon Exposed: No Muscle Exposed: No Joint Exposed: No Bone Exposed: No Treatment Notes Wound #6 (Lower Leg) Wound Laterality: Left, Anterior Cleanser Soap and Water Discharge Instruction: May shower and wash wound with dial antibacterial soap and water prior to dressing change. Wound Cleanser Discharge Instruction: Cleanse the wound with wound cleanser prior to applying a clean dressing using gauze sponges, not tissue or cotton balls. Peri-Wound Care Zinc Oxide Ointment 30g tube Discharge Instruction: Apply Zinc Oxide to periwound with each dressing change Sween Lotion (Moisturizing lotion) Discharge Instruction: Apply moisturizing lotion as directed Topical Primary Dressing Xeroform Occlusive Gauze Dressing, 4x4 in Discharge Instruction: Apply to wound bed as  instructed Secondary Dressing ABD Pad, 5x9 Discharge Instruction: Apply over primary dressing as directed. Secured With Compression Wrap Kerlix Roll 4.5x3.1 (in/yd) Discharge Instruction: Apply Kerlix and Coban compression as directed. Coban Self-Adherent Wrap 4x5 (in/yd) Discharge Instruction: Apply over Kerlix as directed. Compression Stockings Add-Ons Electronic Signature(s) Signed: 01/29/2022 4:57:18 PM By: Erenest Blank Signed: 02/04/2022 3:41:21 PM By: Rhae Hammock RN Entered By: Erenest Blank on 01/29/2022 10:35:55 -------------------------------------------------------------------------------- Wound Assessment Details Patient Name: Date of Service: [name] [name]. 01/29/2022 10:00 A M Medical Record Number: [id number] Patient Account Number: [id number] Date of Birth/Sex: Treating RN: [birth year] (88 y.o. [name], [name] Primary Care Madalina Rosman: [name] Other Clinician: Referring Kasi Lasky: Treating Bonne Whack/Extender: Patricia Nettle, Ihor Gully in Treatment: 15 Wound Status Wound Number: 7 Primary T be determined o Etiology: Wound Location: Left, Lateral Lower Leg Wound Open Wounding Event: Skin Tear/Laceration Status: Date Acquired: 01/17/2022 Comorbid Arrhythmia, Congestive Heart Failure, Coronary Artery Disease, Weeks Of Treatment: 0 History: Hypertension, Peripheral Venous Disease, Osteoarthritis, Seizure Clustered Wound: No Disorder Photos Wound Measurements Length: (cm) 7 Width: (cm) 3 Depth: (cm) 0.1 Area: (cm) 16.493 Volume: (cm) 1.649 % Reduction in Area: % Reduction in Volume: Epithelialization: None Tunneling: No Undermining: No Wound Description Classification: Full Thickness Without Exposed Support Structures Wound Margin: Distinct, outline attached Exudate Amount: Medium Exudate Type: Serosanguineous Exudate Color: red, brown Foul Odor After Cleansing: No Slough/Fibrino No Wound Bed Granulation Amount:  Small (1-33%) Exposed Structure Granulation Quality: Red Fascia Exposed: No Necrotic Amount: Large (67-100%) Fat Layer (Subcutaneous Tissue) Exposed: Yes Necrotic Quality: Adherent Slough Tendon Exposed: No Muscle Exposed: No Joint Exposed: No Bone Exposed: No Treatment Notes Wound #7 (Lower Leg) Wound Laterality: Left, Lateral Cleanser Soap and  Water Discharge Instruction: May shower and wash wound with dial antibacterial soap and water prior to dressing change. Wound Cleanser Discharge Instruction: Cleanse the wound with wound cleanser prior to applying a clean dressing using gauze sponges, not tissue or cotton balls. Peri-Wound Care Zinc Oxide Ointment 30g tube Discharge Instruction: Apply Zinc Oxide to periwound with each dressing change Sween Lotion (Moisturizing lotion) Discharge Instruction: Apply moisturizing lotion as directed Topical Primary Dressing Xeroform Occlusive Gauze Dressing, 4x4 in Discharge Instruction: Apply to wound bed as instructed Secondary Dressing ABD Pad, 5x9 Discharge Instruction: Apply over primary dressing as directed. Secured With Compression Wrap Kerlix Roll 4.5x3.1 (in/yd) Discharge Instruction: Apply Kerlix and Coban compression as directed. Coban Self-Adherent Wrap 4x5 (in/yd) Discharge Instruction: Apply over Kerlix as directed. Compression Stockings Add-Ons Electronic Signature(s) Signed: 01/29/2022 4:57:18 PM By: Erenest Blank Signed: 02/04/2022 3:41:21 PM By: Rhae Hammock RN Entered By: Erenest Blank on 01/29/2022 10:35:03 -------------------------------------------------------------------------------- Vitals Details Patient Name: Date of Service: [name] [name]. 01/29/2022 10:00 A M Medical Record Number: [id number] Patient Account Number: [id number] Date of Birth/Sex: Treating RN: 1924-08-30 (88 y.o. [name], [name] Primary Care Roque Schill: [name] Other Clinician: Referring Aryelle Figg: Treating  Kipton Skillen/Extender: Patricia Nettle, Ihor Gully in Treatment: 15 Vital Signs Time Taken: 10:17 Temperature (F): 98.1 Pulse (bpm): 71 Respiratory Rate (breaths/min): 20 Blood Pressure (mmHg): 125/56 Reference Range: 80 - 120 mg / dl Electronic Signature(s) Signed: 01/29/2022 4:57:18 PM By: Erenest Blank Entered By: Erenest Blank on 01/29/2022 10:18:34

## 2022-02-05 ENCOUNTER — Encounter (HOSPITAL_BASED_OUTPATIENT_CLINIC_OR_DEPARTMENT_OTHER): Payer: Medicare HMO | Admitting: Physician Assistant

## 2022-02-05 DIAGNOSIS — L97812 Non-pressure chronic ulcer of other part of right lower leg with fat layer exposed: Secondary | ICD-10-CM | POA: Diagnosis not present

## 2022-02-05 DIAGNOSIS — L97822 Non-pressure chronic ulcer of other part of left lower leg with fat layer exposed: Secondary | ICD-10-CM | POA: Diagnosis not present

## 2022-02-05 DIAGNOSIS — I110 Hypertensive heart disease with heart failure: Secondary | ICD-10-CM | POA: Diagnosis not present

## 2022-02-05 DIAGNOSIS — I87331 Chronic venous hypertension (idiopathic) with ulcer and inflammation of right lower extremity: Secondary | ICD-10-CM | POA: Diagnosis not present

## 2022-02-05 DIAGNOSIS — L97522 Non-pressure chronic ulcer of other part of left foot with fat layer exposed: Secondary | ICD-10-CM | POA: Diagnosis not present

## 2022-02-05 DIAGNOSIS — I5042 Chronic combined systolic (congestive) and diastolic (congestive) heart failure: Secondary | ICD-10-CM | POA: Diagnosis not present

## 2022-02-05 DIAGNOSIS — G40909 Epilepsy, unspecified, not intractable, without status epilepticus: Secondary | ICD-10-CM | POA: Diagnosis not present

## 2022-02-05 DIAGNOSIS — I87333 Chronic venous hypertension (idiopathic) with ulcer and inflammation of bilateral lower extremity: Secondary | ICD-10-CM | POA: Diagnosis not present

## 2022-02-05 DIAGNOSIS — I87332 Chronic venous hypertension (idiopathic) with ulcer and inflammation of left lower extremity: Secondary | ICD-10-CM | POA: Diagnosis not present

## 2022-02-05 DIAGNOSIS — L97318 Non-pressure chronic ulcer of right ankle with other specified severity: Secondary | ICD-10-CM | POA: Diagnosis not present

## 2022-02-05 NOTE — Progress Notes (Addendum)
[name] [name], [name] [name] ([id number]) Visit Report for 02/05/2022 Chief Complaint Document Details Patient Name: Date of Service: [name] [name], [name] [name] 02/05/2022 10:00 [place] Record Number: [id number] Patient Account Number: [id number] Date of Birth/Sex: Treating RN: 03/11/1925 (88 y.o. [name], [name] Primary Care Provider: [name] Other Clinician: Referring Provider: Treating Provider/Extender: [name] in Treatment: 16 Information Obtained from: Patient Chief Complaint Bilateral LE Ulcers Electronic Signature(s) Signed: 02/05/2022 10:43:12 AM By: Worthy Keeler PA-C Entered By: Worthy Keeler on 02/05/2022 10:43:11 -------------------------------------------------------------------------------- HPI Details Patient Name: Date of Service: [name] [name], [name] [name] 02/05/2022 10:00 [place] Record Number: [id number] Patient Account Number: [id number] Date of Birth/Sex: Treating RN: 11-29-1924 (88 y.o. [name], [name] Primary Care Provider: [name] Other Clinician: Referring Provider: Treating Provider/Extender: [name] in Treatment: 16 History of Present Illness HPI Description: ADMISSION 04/02/2021 This is a pleasant 88 year old man sent to our clinic by Dr. Lyndel Safe from the [facility] unit of [facility]. He has a wound on the right posterior calf. I am uncertain about the duration of this however it looks to be chronic. They are using Santyl covered with silver alginate he does not have any compression. The wound is 100% covered in very fibrinous adherent slough. It also appears that this is quite painful. The patient is exceptionally hard of hearing so it is difficult to get any additional history out of him. Past medical history includes hypertension, coronary artery disease, congestive heart failure, BPH, seizure disorder. He is nonambulatory for reasons that are not totally clear. He lives at  the [facility] of [facility]. ABI in our clinic was 1.03 on the right 11/2; 2-week follow-up. He is having the dressing changed at the facility. He arrives in the clinic with the area about the same in terms of surface area but better looking surface we have been using Iodoflex under 3 layer compression. Unfortunately we still do not have any history of exactly how long this is been there or how it happened. 11/16; 2-week follow-up. Nice improvement in the wound. He is using Iodoflex over 3 layer compression. 11/30 2-week follow-up. [facility] [place]. We have been using Iodoflex under 3 layer compression. They are not wrapping his leg properly but nevertheless the wound area has come down nicely. The patient requests coming here every 3 weeks instead of every 2 12/21; the area on the posterior right calf is fully epithelialized. The patient has chronic venous insufficiency. Readmission: 10-16-2021 upon evaluation today patient appears to be doing somewhat poorly in regard to the wounds on the bilateral lower extremities. He has been tolerating the dressing changes without complication. Fortunately there does not appear to be any evidence of active infection locally or systemically which is great news. Overall I am extremely pleased with where things stand. With that being said that obviously he has quite a bit of swelling and this is led to several areas of significant blistering over his bilateral lower extremities all the wounds are superficial this is the good news. Patient continues to have issues with chronic venous hypertension bilaterally with ulcerations. He also has a history of hypertension and congestive heart failure. He previously tolerated 3 layer compression wraps without complication. He was last seen and discharged in our office June 05, 2021. 10-23-2021 upon evaluation today patient appears to be doing excellent in regard to his legs. They  do look better although  there is a lot of bright green drainage I do believe this likely represents a Pseudomonas infection and that was discussed with him as best I could today again he is extremely hard of hearing. I had to write most of what I wanted him to know down. 10-30-2021 upon evaluation 20 today patient appears to be doing much better in regard to his leg ulcers. He has been taking the Levaquin without complication and overall very pleased in that regard. Fortunately I do not see any evidence of infection locally or systemically which is great news. No fevers, chills, nausea, vomiting, or diarrhea. 11-06-2021 upon evaluation today patient appears to be doing excellent in regard to his wounds. In fact the left leg is almost completely healed the right leg is significantly smaller. Overall I do believe we are on the right track here. 6/7; 2-week follow-up. The patient's left leg is essentially closed. His superficial areas anteriorly look like they are doing very well with nice rims of epithelialization. UNFORTUNATELY he has a new wound on the right lateral malleolus the exact etiology of this is not really clear but he is mostly complaining of this wound. We have been using Xeroform under kerlix Coban The patient is in the [facility]. I am uncertain whether he has 20/30 below-knee stockings or not but he is definitely going to need them. 12-04-2021 upon evaluation today patient appears to be doing better in regard to his wound especially on the left side of the lower extremity although the right side he has a new blister healing of the other wounds are very close to complete closure. This is due to it not being wrapped appropriately from the knee down to the base of the toes. This has been in the orders for the past several visits and to be honest I am not really sure why its not being done as ordered. 12-18-2021 upon evaluation today patient appears to be doing  well currently in regard to his wounds. In fact he is showing signs of excellent improvement which is great news and in fact is only the right ankle which is still open. Overall everything else is doing awesome. I do not see any evidence of active infection locally or systemically at this time. 01-01-2022 upon evaluation today patient appears to be doing well with regard to his wound. He is overall showing signs of excellent improvement which is great news. Fortunately I do not see any evidence of active infection locally or systemically at this time. 01-15-2022 upon evaluation today patient unfortunately is extremely swollen on the left leg and has a new wound which is open at this point. Again I discussed with the patient that this is quite unfortunate especially in light of the fact that he is previously noted to have been completely healed when I saw him 2 weeks ago. Again I believe this is likely open due to the fact that he has not had impression on this leg the facility was supposed to have already ordered and started the compression socks I am not sure that ever happened but also is possible he may have been pushing the compression socks down I really do not know as the facility has not contacted [place] to relay any information whatsoever. 01-29-2022 upon evaluation today patient appears to be doing worse in regard to his legs he is not getting the compression that he needs at this point. Fortunately I do not see any signs of active infection locally  or systemically at this time. 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 The Proteus is being covered by the Cipro nonetheless. Or rather Levaquin. With that being said I think at  this point I would recommend discontinuing the Levaquin and likely switching the patient over to linezolid which I think is going to be an ideal thing that we will treat the infections across the board and what medication. I think this is good to be the best way to go. Subsequently working otherwise continue with the further wound care measures as previously noted he seems to be doing really well with that to be honest. Electronic Signature(s) Signed: 02/05/2022 11:20:50 AM By: Worthy Keeler PA-C Entered By: Worthy Keeler on 02/05/2022 11:20:50 -------------------------------------------------------------------------------- Physical Exam Details Patient Name: Date of Service: TAIVEN, GREENLEY 02/05/2022 10:00 [place] Record Number: [id number] Patient Account Number: [id number] Date of Birth/Sex: Treating RN: 1925-02-18 (88 y.o. [name], [name] Primary Care Provider: [name] Other Clinician: Referring Provider: Treating Provider/Extender: Patricia Nettle, Ihor Gully in Treatment: 74 Constitutional Well-nourished and well-hydrated in no acute distress. Respiratory normal breathing without difficulty. Psychiatric this patient is able to make decisions and demonstrates good insight into disease process. Alert and Oriented x 3. pleasant and cooperative. Notes Upon inspection patient's wound bed actually showed signs of good granulation and epithelization at this point. Fortunately I do not see any signs of active infection at this time which is great news and overall I am extremely pleased with where we stand today. With that being said he does have new areas that are popping up but I think this is part of the cellulitis that is not optimally controlled regularly to go ahead and see about getting set up for the linezolid which I think will be a much better treatment course here. Electronic Signature(s) Signed: 02/05/2022 4:23:52 PM By: Worthy Keeler  PA-C Entered By: Worthy Keeler on 02/05/2022 11:21:25 -------------------------------------------------------------------------------- Physician Orders Details Patient Name: Date of Service: KIP, CROPP 02/05/2022 10:00 [place] Record Number: [id number] Patient Account Number: [id number] Date of Birth/Sex: Treating RN: 1924/08/02 (88 y.o. 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 - 02/12/22 @ 0800 w/ Jeri Cos and Lauran Rm # 9 Return A Other: - We are changing orders back to both legs being wrapped with kerlix and coban. Xeroform to all wounds. T be changed 3 x a week. o ***PLEASE WRAP THE LEGS WITH THE KERLIX and COBAN FROM BOTTOM OF TOES TO JUST BELOW BEND OF KNEE.  WE HAVE MADE MARKS ON BOTH LEGS OF WHERE TO START THE WRAP AND WHERE TO END*** Bathing/ Shower/ Hygiene May shower with protection but do not get wound dressing(s) wet. Edema Control - Lymphedema / SCD / Other Elevate legs to the level of the heart or above for 30 minutes daily and/or when sitting, a frequency of: Avoid standing for long periods of time. Wound Treatment Wound  #4 - Ankle Wound Laterality: Right, Lateral Cleanser: Soap and Water 3 x Per Week/15 Days Discharge Instructions: May shower and wash wound with dial antibacterial soap and water prior to dressing change. Cleanser: Wound Cleanser 3 x Per Week/15 Days Discharge Instructions: Cleanse the wound with wound cleanser prior to applying a clean dressing using gauze sponges, not tissue or cotton balls. Peri-Wound Care: Zinc Oxide Ointment 30g tube 3 x Per Week/15 Days Discharge Instructions: Apply Zinc Oxide to periwound with each dressing change Peri-Wound Care: Sween Lotion (Moisturizing lotion) 3 x Per Week/15 Days Discharge Instructions: Apply moisturizing lotion as directed Prim Dressing: Xeroform Occlusive Gauze Dressing, 4x4 in 3 x Per Week/15 Days ary Discharge Instructions: Apply to wound bed as instructed Secondary Dressing: ABD Pad, 5x9 3 x Per Week/15 Days Discharge Instructions: Apply over primary dressing as directed. Compression Wrap: Kerlix Roll 4.5x3.1 (in/yd) 3 x Per Week/15 Days Discharge Instructions: Apply Kerlix and Coban compression as directed. Compression Wrap: Coban Self-Adherent Wrap 4x5 (in/yd) 3 x Per Week/15 Days Discharge Instructions: Apply over Kerlix as directed. Wound #6 - Lower Leg Wound Laterality: Left, Anterior Cleanser: Soap and Water 3 x Per Week/15 Days Discharge Instructions: May shower and wash wound with dial antibacterial soap and water prior to dressing change. Cleanser: Wound Cleanser 3 x Per Week/15 Days Discharge Instructions: Cleanse the wound with wound cleanser prior to applying a clean dressing using gauze sponges, not tissue or cotton balls. Peri-Wound Care: Zinc Oxide Ointment 30g tube 3 x Per Week/15 Days Discharge Instructions: Apply Zinc Oxide to periwound with each dressing change Peri-Wound Care: Sween Lotion (Moisturizing lotion) 3 x Per Week/15 Days Discharge Instructions: Apply moisturizing lotion as directed Prim Dressing: Xeroform  Occlusive Gauze Dressing, 4x4 in 3 x Per Week/15 Days ary Discharge Instructions: Apply to wound bed as instructed Secondary Dressing: ABD Pad, 5x9 3 x Per Week/15 Days Discharge Instructions: Apply over primary dressing as directed. Compression Wrap: Kerlix Roll 4.5x3.1 (in/yd) 3 x Per Week/15 Days Discharge Instructions: Apply Kerlix and Coban compression as directed. Compression Wrap: Coban Self-Adherent Wrap 4x5 (in/yd) 3 x Per Week/15 Days Discharge Instructions: Apply over Kerlix as directed. Wound #7 - Lower Leg Wound Laterality: Left, Lateral Cleanser: Soap and Water 3 x Per Week/15 Days Discharge Instructions: May shower and wash wound with dial antibacterial soap and water prior to dressing change. Cleanser: Wound Cleanser 3 x Per Week/15 Days Discharge Instructions: Cleanse the wound with wound cleanser prior to applying a clean dressing using gauze sponges, not tissue or cotton balls. Peri-Wound Care: Zinc Oxide Ointment 30g tube 3 x Per Week/15 Days Discharge Instructions: Apply Zinc Oxide to periwound with each dressing change Peri-Wound Care: Sween Lotion (Moisturizing lotion) 3 x Per Week/15 Days Discharge Instructions: Apply moisturizing lotion as directed Prim Dressing: Xeroform Occlusive Gauze Dressing, 4x4 in 3 x Per Week/15 Days ary Discharge Instructions: Apply to wound bed as instructed Secondary Dressing: ABD Pad, 5x9 3 x Per Week/15 Days Discharge Instructions: Apply over primary dressing as directed. Compression Wrap: Kerlix Roll 4.5x3.1 (in/yd) 3 x Per Week/15 Days Discharge Instructions: Apply Kerlix  and Coban compression as directed. Compression Wrap: Coban Self-Adherent Wrap 4x5 (in/yd) 3 x Per Week/15 Days Discharge Instructions: Apply over Kerlix as directed. Wound #8 - T Great oe Wound Laterality: Left Cleanser: Soap and Water 3 x Per Week/15 Days Discharge Instructions: May shower and wash wound with dial antibacterial soap and water prior to dressing  change. Cleanser: Wound Cleanser 3 x Per Week/15 Days Discharge Instructions: Cleanse the wound with wound cleanser prior to applying a clean dressing using gauze sponges, not tissue or cotton balls. Peri-Wound Care: Zinc Oxide Ointment 30g tube 3 x Per Week/15 Days Discharge Instructions: Apply Zinc Oxide to periwound with each dressing change Peri-Wound Care: Sween Lotion (Moisturizing lotion) 3 x Per Week/15 Days Discharge Instructions: Apply moisturizing lotion as directed Prim Dressing: Xeroform Occlusive Gauze Dressing, 4x4 in 3 x Per Week/15 Days ary Discharge Instructions: Apply to wound bed as instructed Secondary Dressing: Woven Gauze Sponge, Non-Sterile 4x4 in 3 x Per Week/15 Days Discharge Instructions: Apply over primary dressing as directed. Secured With: Child psychotherapist, Sterile 2x75 (in/in) 3 x Per Week/15 Days Discharge Instructions: Secure with stretch gauze as directed. Secured With: 87M Medipore H Soft Cloth Surgical T ape, 4 x 10 (in/yd) 3 x Per Week/15 Days Discharge Instructions: Secure with tape as directed. Patient Medications llergies: No Known Allergies A Notifications Medication Indication Start End 02/05/2022 linezolid DOSE 1 - oral 600 mg tablet - 1 tablet oral taken 2 times per day for 14 days Electronic Signature(s) Signed: 02/05/2022 4:23:52 PM By: Worthy Keeler PA-C Entered By: Worthy Keeler on 02/05/2022 11:22:40 Prescription 02/05/2022 -------------------------------------------------------------------------------- Billie Lade PA Patient Name: Provider: 14-Sep-1924 [id number] Date of Birth: NPI#: [id number] Sex: DEA #: [phone] Phone #: License #: [place] Patient Address: [address], [place] [address] [place], [place] [address] [phone] Allergies No Known  Allergies Medication Medication: Route: Strength: Form: linezolid oral 600 mg tablet Class: OXAZOLIDINONE ANTIBIOTICS Dose: Frequency / Time: Indication: 1 1 tablet oral taken 2 times per day for 14 days Number of Refills: Number of Units: 0 Twenty Eight (28) Tablet(s) Generic Substitution: Start Date: End Date: Administered at Facility: Substitution Permitted 10/12/7679 No Note to Pharmacy: Hand Signature: Date(s): Electronic Signature(s) Signed: 02/05/2022 11:23:07 AM By: Worthy Keeler PA-C Entered By: Worthy Keeler on 02/05/2022 11:23:07 -------------------------------------------------------------------------------- Problem List Details Patient Name: Date of Service: [name]. 02/05/2022 10:00 A M Medical Record Number: [id number] Patient Account Number: [id number] Date of Birth/Sex: Treating RN: June 18, 1924 (88 y.o. 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 11/20/2021 No Yes L97.812 Non-pressure chronic ulcer of other part of right lower leg with fat layer 10/16/2021 No Yes exposed [place] (primary) hypertension 10/16/2021 No Yes I50.42 Chronic combined systolic (congestive) and diastolic (congestive) heart failure 10/16/2021 No Yes Inactive Problems Resolved Problems Electronic Signature(s) Signed: 02/05/2022 10:43:03 AM By:  Worthy Keeler PA-C  Entered By: Worthy Keeler on 02/05/2022 10:43:02 -------------------------------------------------------------------------------- Progress Note Details Patient Name: Date of Service: FITZ, MATSUO 02/05/2022 10:00 [place] Record Number: [id number] Patient Account Number: [id number] Date of Birth/Sex: Treating RN: 1925-02-06 (88 y.o. [name], [name] Primary Care Provider: [name] Other Clinician: Referring Provider: Treating Provider/Extender: [name] in Treatment: 16 Subjective Chief Complaint Information obtained from Patient Bilateral LE Ulcers History of Present Illness (HPI) ADMISSION 04/02/2021 This is a pleasant 88 year old man sent to our clinic by Dr. Lyndel Safe from the [facility] unit of [facility]. He has a wound on the right posterior calf. I am uncertain about the duration of this however it looks to be chronic. They are using Santyl covered with silver alginate he does not have any compression. The wound is 100% covered in very fibrinous adherent slough. It also appears that this is quite painful. The patient is exceptionally hard of hearing so it is difficult to get any additional history out of him. Past medical history includes hypertension, coronary artery disease, congestive heart failure, BPH, seizure disorder. He is nonambulatory for reasons that are not totally clear. He lives at the [facility] of [facility]. ABI in our clinic was 1.03 on the right 11/2; 2-week follow-up. He is having the dressing changed at the facility. He arrives in the clinic with the area about the same in terms of surface area but better looking surface we have been using Iodoflex under 3 layer compression. Unfortunately we still do not have any history of exactly how long this is been there or how it happened. 11/16; 2-week follow-up. Nice improvement in the wound. He is using Iodoflex over 3 layer  compression. 11/30 2-week follow-up. [facility] is at [facility] [place]. We have been using Iodoflex under 3 layer compression. They are not wrapping his leg properly but nevertheless the wound area has come down nicely. The patient requests coming here every 3 weeks instead of every 2 12/21; the area on the posterior right calf is fully epithelialized. The patient has chronic venous insufficiency. Readmission: 10-16-2021 upon evaluation today patient appears to be doing somewhat poorly in regard to the wounds on the bilateral lower extremities. He has been tolerating the dressing changes without complication. Fortunately there does not appear to be any evidence of active infection locally or systemically which is great news. Overall I am extremely pleased with where things stand. With that being said that obviously he has quite a bit of swelling and this is led to several areas of significant blistering over his bilateral lower extremities all the wounds are superficial this is the good news. Patient continues to have issues with chronic venous hypertension bilaterally with ulcerations. He also has a history of hypertension and congestive heart failure. He previously tolerated 3 layer compression wraps without complication. He was last seen and discharged in our office June 05, 2021. 10-23-2021 upon evaluation today patient appears to be doing excellent in regard to his legs. They do look better although there is a lot of bright green drainage I do believe this likely represents a Pseudomonas infection and that was discussed with him as best I could today again he is extremely hard of hearing. I had to write most of what I wanted him to know down. 10-30-2021 upon evaluation 20 today patient appears to be doing much better in regard to his leg ulcers. He has been taking the Levaquin without complication and  overall very pleased in that regard. Fortunately I do not see any evidence of  infection locally or systemically which is great news. No fevers, chills, nausea, vomiting, or diarrhea. 11-06-2021 upon evaluation today patient appears to be doing excellent in regard to his wounds. In fact the left leg is almost completely healed the right leg is significantly smaller. Overall I do believe we are on the right track here. 6/7; 2-week follow-up. The patient's left leg is essentially closed. His superficial areas anteriorly look like they are doing very well with nice rims of epithelialization. UNFORTUNATELY he has a new wound on the right lateral malleolus the exact etiology of this is not really clear but he is mostly complaining of this wound. We have been using Xeroform under kerlix Coban The patient is in the [facility]. I am uncertain whether he has 20/30 below-knee stockings or not but he is definitely going to need them. 12-04-2021 upon evaluation today patient appears to be doing better in regard to his wound especially on the left side of the lower extremity although the right side he has a new blister healing of the other wounds are very close to complete closure. This is due to it not being wrapped appropriately from the knee down to the base of the toes. This has been in the orders for the past several visits and to be honest I am not really sure why its not being done as ordered. 12-18-2021 upon evaluation today patient appears to be doing well currently in regard to his wounds. In fact he is showing signs of excellent improvement which is great news and in fact is only the right ankle which is still open. Overall everything else is doing awesome. I do not see any evidence of active infection locally or systemically at this time. 01-01-2022 upon evaluation today patient appears to be doing well with regard to his wound. He is overall showing signs of excellent improvement which is great news. Fortunately I do not see any evidence of active  infection locally or systemically at this time. 01-15-2022 upon evaluation today patient unfortunately is extremely swollen on the left leg and has a new wound which is open at this point. Again I discussed with the patient that this is quite unfortunate especially in light of the fact that he is previously noted to have been completely healed when I saw him 2 weeks ago. Again I believe this is likely open due to the fact that he has not had impression on this leg the facility was supposed to have already ordered and started the compression socks I am not sure that ever happened but also is possible he may have been pushing the compression socks down I really do not know as the facility has not contacted [place] to relay any information whatsoever. 01-29-2022 upon evaluation today patient appears to be doing worse in regard to his legs he is not getting the compression that he needs at this point. Fortunately I do not see any signs of active infection locally or systemically at this time. 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 The Proteus is being covered by the Cipro nonetheless. Or rather  Levaquin. With that being said I think at this point I would recommend discontinuing the Levaquin and likely switching the patient over to linezolid which I think is going to be an ideal thing that we will treat the infections across the board and what medication. I think this is good to be the best way to go. Subsequently working otherwise continue with the further wound care measures as previously noted he seems to be doing really well with that to be honest. Objective Constitutional Well-nourished and well-hydrated in no acute  distress. Vitals Time Taken: 10:11 AM, Temperature: 97.9 F, Pulse: 61 bpm, Respiratory Rate: 18 breaths/min, Blood Pressure: 123/58 mmHg. Respiratory normal breathing without difficulty. Psychiatric this patient is able to make decisions and demonstrates good insight into disease process. Alert and Oriented x 3. pleasant and cooperative. General Notes: Upon inspection patient's wound bed actually showed signs of good granulation and epithelization at this point. Fortunately I do not see any signs of active infection at this time which is great news and overall I am extremely pleased with where we stand today. With that being said he does have new areas that are popping up but I think this is part of the cellulitis that is not optimally controlled regularly to go ahead and see about getting set up for the linezolid which I think will be a much better treatment course here. Integumentary (Hair, Skin) Wound #4 status is Open. Original cause of wound was Gradually Appeared. The date acquired was: 11/20/2021. The wound has been in treatment 11 weeks. The wound is located on the Right,Lateral Ankle. The wound measures 0.3cm length x 0.5cm width x 0.2cm depth; 0.118cm^2 area and 0.024cm^3 volume. There is no tunneling or undermining noted. There is a medium amount of serosanguineous drainage noted. The wound margin is distinct with the outline attached to the wound base. There is no granulation within the wound bed. There is a large (67-100%) amount of necrotic tissue within the wound bed including Eschar and Adherent Slough. Wound #6 status is Open. Original cause of wound was Blister. The date acquired was: 01/15/2022. The wound has been in treatment 3 weeks. The wound is located on the Left,Anterior Lower Leg. The wound measures 0.7cm length x 1cm width x 0.1cm depth; 0.55cm^2 area and 0.055cm^3 volume. There is Fat Layer (Subcutaneous Tissue) exposed. There is no tunneling or undermining noted. There is  a medium amount of serosanguineous drainage noted. The wound margin is distinct with the outline attached to the wound base. There is small (1-33%) red granulation within the wound bed. There is a large (67-100%) amount of necrotic tissue within the wound bed including Adherent Slough. Wound #7 status is Open. Original cause of wound was Skin T ear/Laceration. The date acquired was: 01/17/2022. The wound has been in treatment 1 weeks. The wound is located on the Left,Lateral Lower Leg. The wound measures 5.5cm length x 2cm width x 0.1cm depth; 8.639cm^2 area and 0.864cm^3 volume. There is Fat Layer (Subcutaneous Tissue) exposed. There is no tunneling or undermining noted. There is a medium amount of serosanguineous drainage noted. The wound margin is distinct with the outline attached to the wound base. There is small (1-33%) red granulation within the wound bed. There is a large (67-100%) amount of necrotic tissue within the wound bed including Adherent Slough. Wound #8 status is Open. Original cause of wound was Gradually Appeared. The date acquired was: 02/04/2022. The wound is located on the Left T Great. The oe wound measures 1cm  length x 1cm width x 0.1cm depth; 0.785cm^2 area and 0.079cm^3 volume. There is Fat Layer (Subcutaneous Tissue) exposed. There is no tunneling or undermining noted. There is a medium amount of serosanguineous drainage noted. The wound margin is distinct with the outline attached to the wound base. There is medium (34-66%) red, pink granulation within the wound bed. There is a medium (34-66%) amount of necrotic tissue within the wound bed including Adherent Slough. 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 - 02/12/22 @ 0800 w/ Jeri Cos and Lauran Rm # 9 Other: - We are changing orders back to both legs being wrapped with kerlix and coban. Xeroform to all wounds. T be changed 3 x a week. ***PLEASE WRAP o THE LEGS WITH THE KERLIX and COBAN FROM BOTTOM OF TOES TO JUST BELOW BEND OF KNEE. WE HAVE MADE MARKS ON BOTH LEGS OF WHERE TO START THE WRAP AND WHERE TO END*** Bathing/ Shower/ Hygiene: May shower with protection but do not get wound dressing(s) wet. Edema Control - Lymphedema / SCD / Other: Elevate legs to the level of the heart or above for 30 minutes daily and/or when sitting, a frequency of: Avoid standing for long periods of time. The following medication(s) was prescribed: linezolid oral 600 mg tablet 1 1 tablet oral taken 2 times per day for 14 days starting 02/05/2022 WOUND #4: - Ankle Wound Laterality: Right, Lateral Cleanser: Soap and Water 3 x Per Week/15 Days Discharge Instructions: May shower and wash wound with dial antibacterial soap and water prior to dressing change. Cleanser: Wound Cleanser 3 x Per Week/15 Days Discharge Instructions: Cleanse the wound with wound cleanser prior to applying a clean dressing using gauze sponges, not tissue or cotton balls. Peri-Wound Care: Zinc Oxide Ointment 30g tube 3 x Per Week/15 Days Discharge Instructions: Apply Zinc Oxide to periwound with each dressing change Peri-Wound Care: Sween Lotion (Moisturizing lotion) 3 x Per Week/15 Days Discharge Instructions: Apply moisturizing lotion as directed Prim Dressing: Xeroform Occlusive Gauze Dressing, 4x4 in 3 x Per Week/15 Days ary Discharge Instructions: Apply  to wound bed as instructed Secondary Dressing: ABD Pad, 5x9 3 x Per Week/15 Days Discharge Instructions: Apply over primary dressing as directed. Com pression Wrap: Kerlix Roll 4.5x3.1 (in/yd) 3 x Per Week/15 Days Discharge Instructions: Apply Kerlix and Coban compression as directed. Com pression Wrap: Coban Self-Adherent Wrap 4x5 (in/yd) 3 x Per Week/15 Days Discharge Instructions: Apply over Kerlix as directed. WOUND #6: - Lower Leg Wound Laterality: Left, Anterior Cleanser: Soap and Water 3 x Per Week/15 Days Discharge Instructions: May shower and wash wound with dial antibacterial soap and water prior to dressing change. Cleanser: Wound Cleanser 3 x Per Week/15 Days Discharge Instructions: Cleanse the wound with wound cleanser prior to applying a clean dressing using gauze sponges, not tissue or cotton balls. Peri-Wound Care: Zinc Oxide Ointment 30g tube 3 x Per Week/15 Days Discharge Instructions: Apply Zinc Oxide to periwound with each dressing change Peri-Wound Care: Sween Lotion (Moisturizing lotion) 3 x Per Week/15 Days Discharge Instructions: Apply  moisturizing lotion as directed Prim Dressing: Xeroform Occlusive Gauze Dressing, 4x4 in 3 x Per Week/15 Days ary Discharge Instructions: Apply to wound bed as instructed Secondary Dressing: ABD Pad, 5x9 3 x Per Week/15 Days Discharge Instructions: Apply over primary dressing as directed. Com pression Wrap: Kerlix Roll 4.5x3.1 (in/yd) 3 x Per Week/15 Days Discharge Instructions: Apply Kerlix and Coban compression as directed. Com pression Wrap: Coban Self-Adherent Wrap 4x5 (in/yd) 3 x Per Week/15 Days Discharge Instructions: Apply over Kerlix as directed. WOUND #7: - Lower Leg Wound Laterality: Left, Lateral Cleanser: Soap and Water 3 x Per Week/15 Days Discharge Instructions: May shower and wash wound with dial antibacterial soap and water prior to dressing change. Cleanser: Wound Cleanser 3 x Per Week/15 Days Discharge  Instructions: Cleanse the wound with wound cleanser prior to applying a clean dressing using gauze sponges, not tissue or cotton balls. Peri-Wound Care: Zinc Oxide Ointment 30g tube 3 x Per Week/15 Days Discharge Instructions: Apply Zinc Oxide to periwound with each dressing change Peri-Wound Care: Sween Lotion (Moisturizing lotion) 3 x Per Week/15 Days Discharge Instructions: Apply moisturizing lotion as directed Prim Dressing: Xeroform Occlusive Gauze Dressing, 4x4 in 3 x Per Week/15 Days ary Discharge Instructions: Apply to wound bed as instructed Secondary Dressing: ABD Pad, 5x9 3 x Per Week/15 Days Discharge Instructions: Apply over primary dressing as directed. Com pression Wrap: Kerlix Roll 4.5x3.1 (in/yd) 3 x Per Week/15 Days Discharge Instructions: Apply Kerlix and Coban compression as directed. Com pression Wrap: Coban Self-Adherent Wrap 4x5 (in/yd) 3 x Per Week/15 Days Discharge Instructions: Apply over Kerlix as directed. WOUND #8: - T Great Wound Laterality: Left oe Cleanser: Soap and Water 3 x Per Week/15 Days Discharge Instructions: May shower and wash wound with dial antibacterial soap and water prior to dressing change. Cleanser: Wound Cleanser 3 x Per Week/15 Days Discharge Instructions: Cleanse the wound with wound cleanser prior to applying a clean dressing using gauze sponges, not tissue or cotton balls. Peri-Wound Care: Zinc Oxide Ointment 30g tube 3 x Per Week/15 Days Discharge Instructions: Apply Zinc Oxide to periwound with each dressing change Peri-Wound Care: Sween Lotion (Moisturizing lotion) 3 x Per Week/15 Days Discharge Instructions: Apply moisturizing lotion as directed Prim Dressing: Xeroform Occlusive Gauze Dressing, 4x4 in 3 x Per Week/15 Days ary Discharge Instructions: Apply to wound bed as instructed Secondary Dressing: Woven Gauze Sponge, Non-Sterile 4x4 in 3 x Per Week/15 Days Discharge Instructions: Apply over primary dressing as directed. Secured  With: Child psychotherapist, Sterile 2x75 (in/in) 3 x Per Week/15 Days Discharge Instructions: Secure with stretch gauze as directed. Secured With: 42M Medipore H Soft Cloth Surgical T ape, 4 x 10 (in/yd) 3 x Per Week/15 Days Discharge Instructions: Secure with tape as directed. 1. I would recommend currently that we discontinue the Levaquin and switch the patient over to linezolid which I think is good to be a much better option here. If the facility is absolutely unable to get the linezolid then the alternative would be to. But I would prefer not to have to have 2 different medications.Prescribe doxycycline and Augmentin for the patient 2. I am also can recommend that we have the patient continue to monitor for any signs of worsening or infection. If he has any worsening symptoms overall the facility should contact the office and let me know. We will see patient back for reevaluation in 1 week here in the clinic. If anything worsens or changes patient will contact our office for additional recommendations. Electronic  Signature(s) Signed: 02/05/2022 11:22:48 AM By: Worthy Keeler PA-C Entered By: Worthy Keeler on 02/05/2022 11:22:48 -------------------------------------------------------------------------------- SuperBill Details Patient Name: Date of Service: BRAUN, ROCCA 02/05/2022 Medical Record Number: [id number] Patient Account Number: [id number] Date of Birth/Sex: Treating RN: 09/29/1924 (88 y.o. 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 L97.812 Non-pressure chronic ulcer of other part of right lower leg with fat layer exposed I10 Essential (primary) hypertension I50.42 Chronic combined systolic (congestive) and diastolic (congestive) heart failure Facility Procedures CPT4 Code: [id number] Description: [phone] - WOUND CARE VISIT-LEV 5 EST PT Modifier: Quantity: 1 Physician Procedures : CPT4 Code Description Modifier [id number] - WC PHYS LEVEL 4 - EST PT ICD-10 Diagnosis Description I87.331 Chronic venous hypertension (idiopathic) with ulcer and inflammation of right lower extremity I87.332 Chronic venous hypertension (idiopathic)  with ulcer and inflammation of left lower extremity L97.822 Non-pressure chronic ulcer of other part of left lower leg with fat layer exposed L97.318 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

## 2022-02-06 NOTE — Progress Notes (Signed)
[name] [name], [name] [name] ([id number]) Visit Report for 02/05/2022 Arrival Information Details Patient Name: Date of Service: [name] [name], [name] [name] 02/05/2022 10:00 [place] Record Number: [id number] Patient Account Number: [id number] Date of Birth/Sex: Treating RN: [birth year] (88 y.o. [name], [name] Primary Care Audri Kozub: [name] Other Clinician: Referring Lainy Wrobleski: Treating Julie-Ann Vanmaanen/Extender: [name] in Treatment: 16 Visit Information History Since Last Visit Added or deleted any medications: No Patient Arrived: Wheel Chair Any new allergies or adverse reactions: No Arrival Time: 10:10 Had a fall or experienced change in No Accompanied By: self activities of daily living that may affect Transfer Assistance: None risk of falls: Patient Identification Verified: Yes Signs or symptoms of abuse/neglect since last visito No Secondary Verification Process Completed: Yes Hospitalized since last visit: No Patient Requires Transmission-Based Precautions: No Implantable device outside of the clinic excluding No Patient Has Alerts: Yes cellular tissue based products placed in the center Patient Alerts: ABI's 05/23 R: N/C since last visit: Has Compression in Place as Prescribed: Yes Pain Present Now: No Electronic Signature(s) Signed: 02/06/2022 3:58:21 PM By: Rhae Hammock RN Entered By: Rhae Hammock on 02/05/2022 10:10:43 -------------------------------------------------------------------------------- Clinic Level of Care Assessment Details Patient Name: Date of Service: [name] [name], [name] [name] 02/05/2022 10:00 A M Medical Record Number: [id number] Patient Account Number: [id number] Date of Birth/Sex: Treating RN: 03/29/1925 (88 y.o. [name], [name] Primary Care Pinki Rottman: [name] Other Clinician: Referring Ivette Castronova: Treating Illene Sweeting/Extender: [name] in Treatment: 16 Clinic Level of Care Assessment  Items TOOL 4 Quantity Score X- 1 0 Use when only an EandM is performed on FOLLOW-UP visit ASSESSMENTS - Nursing Assessment / Reassessment X- 1 10 Reassessment of Co-morbidities (includes updates in patient status) X- 1 5 Reassessment of Adherence to Treatment Plan ASSESSMENTS - Wound and Skin A ssessment / Reassessment '[]'$  - 0 Simple Wound Assessment / Reassessment - one wound X- 4 5 Complex Wound Assessment / Reassessment - multiple wounds '[]'$  - 0 Dermatologic / Skin Assessment (not related to wound area) ASSESSMENTS - Focused Assessment X- 2 5 Circumferential Edema Measurements - multi extremities '[]'$  - 0 Nutritional Assessment / Counseling / Intervention '[]'$  - 0 Lower Extremity Assessment (monofilament, tuning fork, pulses) '[]'$  - 0 Peripheral Arterial Disease Assessment (using hand held doppler) ASSESSMENTS - Ostomy and/or Continence Assessment and Care '[]'$  - 0 Incontinence Assessment and Management '[]'$  - 0 Ostomy Care Assessment and Management (repouching, etc.) PROCESS - Coordination of Care '[]'$  - 0 Simple Patient / Family Education for ongoing care X- 1 20 Complex (extensive) Patient / Family Education for ongoing care X- 1 10 Staff obtains Programmer, systems, Records, T Results / Process Orders est X- 1 10 Staff telephones HHA, Nursing Homes / Clarify orders / etc '[]'$  - 0 Routine Transfer to another Facility (non-emergent condition) '[]'$  - 0 Routine Hospital Admission (non-emergent condition) '[]'$  - 0 New Admissions / Biomedical engineer / Ordering NPWT Apligraf, etc. , '[]'$  - 0 Emergency Hospital Admission (emergent condition) '[]'$  - 0 Simple Discharge Coordination X- 1 15 Complex (extensive) Discharge Coordination PROCESS - Special Needs '[]'$  - 0 Pediatric / Minor Patient Management '[]'$  - 0 Isolation Patient Management '[]'$  - 0 Hearing / Language / Visual special needs '[]'$  - 0 Assessment of Community assistance (transportation, D/C planning, etc.) '[]'$  - 0 Additional  assistance / Altered mentation '[]'$  - 0 Support Surface(s) Assessment (bed, cushion, seat, etc.) INTERVENTIONS - Wound Cleansing / Measurement '[]'$  - 0 Simple Wound Cleansing - one wound X- 4  5 Complex Wound Cleansing - multiple wounds X- 1 5 Wound Imaging (photographs - any number of wounds) '[]'$  - 0 Wound Tracing (instead of photographs) '[]'$  - 0 Simple Wound Measurement - one wound X- 4 5 Complex Wound Measurement - multiple wounds INTERVENTIONS - Wound Dressings '[]'$  - 0 Small Wound Dressing one or multiple wounds '[]'$  - 0 Medium Wound Dressing one or multiple wounds X- 4 20 Large Wound Dressing one or multiple wounds '[]'$  - 0 Application of Medications - topical '[]'$  - 0 Application of Medications - injection INTERVENTIONS - Miscellaneous '[]'$  - 0 External ear exam '[]'$  - 0 Specimen Collection (cultures, biopsies, blood, body fluids, etc.) '[]'$  - 0 Specimen(s) / Culture(s) sent or taken to Lab for analysis '[]'$  - 0 Patient Transfer (multiple staff / Civil Service fast streamer / Similar devices) '[]'$  - 0 Simple Staple / Suture removal (25 or less) '[]'$  - 0 Complex Staple / Suture removal (26 or more) '[]'$  - 0 Hypo / Hyperglycemic Management (close monitor of Blood Glucose) '[]'$  - 0 Ankle / Brachial Index (ABI) - do not check if billed separately X- 1 5 Vital Signs Has the patient been seen at the hospital within the last three years: Yes Total Score: 230 Level Of Care: New/Established - Level 5 Electronic Signature(s) Signed: 02/06/2022 3:58:21 PM By: Rhae Hammock RN Entered By: Rhae Hammock on 02/05/2022 10:59:22 -------------------------------------------------------------------------------- Encounter Discharge Information Details Patient Name: Date of Service: [name] [name]. 02/05/2022 10:00 A M Medical Record Number: [id number] Patient Account Number: [id number] Date of Birth/Sex: Treating RN: 02-27-1925 (88 y.o. [name], [name] Primary Care Bushra Denman: [name] Other  Clinician: Referring Shaelee Forni: Treating Dontavia Brand/Extender: [name] in Treatment: 16 Encounter Discharge Information Items Discharge Condition: Stable Ambulatory Status: Wheelchair Discharge Destination: Home Transportation: Private Auto Accompanied By: self Schedule Follow-up Appointment: Yes Clinical Summary of Care: Patient Declined Electronic Signature(s) Signed: 02/06/2022 3:58:21 PM By: Rhae Hammock RN Entered By: Rhae Hammock on 02/05/2022 11:14:57 -------------------------------------------------------------------------------- Lower Extremity Assessment Details Patient Name: Date of Service: [name] [name], [name] [name] 02/05/2022 10:00 A M Medical Record Number: [id number] Patient Account Number: [id number] Date of Birth/Sex: Treating RN: January 12, 1925 (88 y.o. [name], [name] Primary Care Angelos Wasco: [name] Other Clinician: Referring Jnai Snellgrove: Treating Justiss Gerbino/Extender: Patricia Nettle, Meredith Staggers Weeks in Treatment: 16 Edema Assessment Assessed: Shirlyn Goltz: Yes] Patrice Paradise: Yes] Edema: [Left: Yes] [Right: Yes] Calf Left: Right: Point of Measurement: 34 cm From Medial Instep 38 cm 38.7 cm Ankle Left: Right: Point of Measurement: 9 cm From Medial Instep 24.3 cm 23 cm Vascular Assessment Pulses: Dorsalis Pedis Palpable: [Left:Yes] [Right:Yes] Posterior Tibial Palpable: [Left:Yes] [Right:Yes] Electronic Signature(s) Signed: 02/06/2022 3:58:21 PM By: Rhae Hammock RN Entered By: Rhae Hammock on 02/05/2022 11:08:09 -------------------------------------------------------------------------------- [place] Details Patient Name: Date of Service: [name] [name]. 02/05/2022 10:00 A M Medical Record Number: [id number] Patient Account Number: [id number] Date of Birth/Sex: Treating RN: [birth year] (88 y.o. [name], [name] Primary Care Valbona Slabach: [name] Other Clinician: Referring  Kitty Cadavid: Treating Amarie Viles/Extender: [name] in Treatment: 16 Active Inactive Wound/Skin Impairment Nursing Diagnoses: Impaired tissue integrity Knowledge deficit related to ulceration/compromised skin integrity Goals: Patient will have a decrease in wound volume by X% from date: (specify in notes) Date Initiated: 10/16/2021 Target Resolution Date: 02/14/2022 Goal Status: Active Patient/caregiver will verbalize understanding of skin care regimen Date Initiated: 10/16/2021 Target Resolution Date: 02/14/2022 Goal Status: Active Ulcer/skin breakdown will have a volume reduction of 30% by week 4 Date Initiated:  10/16/2021 Target Resolution Date: 02/14/2022 Goal Status: Active Interventions: Assess patient/caregiver ability to obtain necessary supplies Assess patient/caregiver ability to perform ulcer/skin care regimen upon admission and as needed Assess ulceration(s) every visit Notes: Electronic Signature(s) Signed: 02/06/2022 3:58:21 PM By: Rhae Hammock RN Entered By: Rhae Hammock on 02/05/2022 10:58:13 -------------------------------------------------------------------------------- Pain Assessment Details Patient Name: Date of Service: [name] [name]. 02/05/2022 10:00 [place] Record Number: [id number] Patient Account Number: [id number] Date of Birth/Sex: Treating RN: 1925/02/02 (88 y.o. [name], [name] Primary Care Aemilia Dedrick: [name] Other Clinician: Referring Hollister Wessler: Treating Sosaia Pittinger/Extender: [name] in Treatment: 16 Active Problems Location of Pain Severity and Description of Pain Patient Has Paino No Site Locations Pain Management and Medication Current Pain Management: Electronic Signature(s) Signed: 02/06/2022 3:58:21 PM By: Rhae Hammock RN Entered By: Rhae Hammock on 02/05/2022  10:11:53 -------------------------------------------------------------------------------- Patient/Caregiver Education Details Patient Name: Date of Service: Stansell, WILLIA M E. 8/23/2023andnbsp10:00 [place] Record Number: [id number] Patient Account Number: [id number] Date of Birth/Gender: Treating RN: [birth year] (88 y.o. Erie Noe Primary Care Physician: [name] Other Clinician: Referring Physician: Treating Physician/Extender: [name] in Treatment: 16 Education Assessment Education Provided To: Patient Education Topics Provided Wound/Skin Impairment: Methods: Explain/Verbal Responses: Reinforcements needed, State content correctly Electronic Signature(s) Signed: 02/06/2022 3:58:21 PM By: Rhae Hammock RN Entered By: Rhae Hammock on 02/05/2022 10:58:26 -------------------------------------------------------------------------------- Wound Assessment Details Patient Name: Date of Service: [name] [name]. 02/05/2022 10:00 A M Medical Record Number: [id number] Patient Account Number: [id number] Date of Birth/Sex: Treating RN: Sep 28, 1924 (88 y.o. [name], [name] Primary Care Lyrical Sowle: [name] Other Clinician: Referring Keyarah Mcroy: Treating Jaedin Trumbo/Extender: [name] in Treatment: 16 Wound Status Wound Number: 4 Primary Venous Leg Ulcer Etiology: Wound Location: Right, Lateral Ankle Wound Open Wounding Event: Gradually Appeared Status: Date Acquired: 11/20/2021 Comorbid Arrhythmia, Congestive Heart Failure, Coronary Artery Disease, Weeks Of Treatment: 11 History: Hypertension, Peripheral Venous Disease, Osteoarthritis, Seizure Clustered Wound: No Disorder Photos Wound Measurements Length: (cm) 0.3 Width: (cm) 0.5 Depth: (cm) 0.2 Area: (cm) 0.118 Volume: (cm) 0.024 % Reduction in Area: 85% % Reduction in Volume: 69.6% Epithelialization: Small (1-33%) Tunneling:  No Undermining: No Wound Description Classification: Full Thickness With Exposed Support Structures Wound Margin: Distinct, outline attached Exudate Amount: Medium Exudate Type: Serosanguineous Exudate Color: red, brown Foul Odor After Cleansing: No Slough/Fibrino Yes Wound Bed Granulation Amount: None Present (0%) Necrotic Amount: Large (67-100%) Necrotic Quality: Eschar, Adherent Slough Treatment Notes Wound #4 (Ankle) Wound Laterality: Right, Lateral Cleanser Soap and Water Discharge Instruction: May shower and wash wound with dial antibacterial soap and water prior to dressing change. Wound Cleanser Discharge Instruction: Cleanse the wound with wound cleanser prior to applying a clean dressing using gauze sponges, not tissue or cotton balls. Peri-Wound Care Zinc Oxide Ointment 30g tube Discharge Instruction: Apply Zinc Oxide to periwound with each dressing change Sween Lotion (Moisturizing lotion) Discharge Instruction: Apply moisturizing lotion as directed Topical Primary Dressing Xeroform Occlusive Gauze Dressing, 4x4 in Discharge Instruction: Apply to wound bed as instructed Secondary Dressing ABD Pad, 5x9 Discharge Instruction: Apply over primary dressing as directed. Secured With Compression Wrap Kerlix Roll 4.5x3.1 (in/yd) Discharge Instruction: Apply Kerlix and Coban compression as directed. Coban Self-Adherent Wrap 4x5 (in/yd) Discharge Instruction: Apply over Kerlix as directed. Compression Stockings Add-Ons Electronic Signature(s) Signed: 02/06/2022 10:35:58 AM By: Erenest Blank Signed: 02/06/2022 3:58:21 PM By: Rhae Hammock RN Entered By: Erenest Blank on 02/05/2022 10:32:33 -------------------------------------------------------------------------------- Wound Assessment Details Patient  Name: Date of Service: [name] [name], [name] [name] 02/05/2022 10:00 A M Medical Record Number: [id number] Patient Account Number: [id number] Date of Birth/Sex: Treating  RN: [birth year] (88 y.o. [name], [name] Primary Care Jaimie Pippins: [name] Other Clinician: Referring Vincient Vanaman: Treating Charliegh Vasudevan/Extender: [name] in Treatment: 16 Wound Status Wound Number: 6 Primary Venous Leg Ulcer Etiology: Wound Location: Left, Anterior Lower Leg Wound Open Wounding Event: Blister Status: Date Acquired: 01/15/2022 Comorbid Arrhythmia, Congestive Heart Failure, Coronary Artery Disease, Weeks Of Treatment: 3 History: Hypertension, Peripheral Venous Disease, Osteoarthritis, Seizure Clustered Wound: No Disorder Photos Wound Measurements Length: (cm) 0.7 Width: (cm) 1 Depth: (cm) 0.1 Area: (cm) 0.55 Volume: (cm) 0.055 % Reduction in Area: 93.7% % Reduction in Volume: 93.7% Epithelialization: Small (1-33%) Tunneling: No Undermining: No Wound Description Classification: Full Thickness Without Exposed Support Structures Wound Margin: Distinct, outline attached Exudate Amount: Medium Exudate Type: Serosanguineous Exudate Color: red, brown Foul Odor After Cleansing: No Slough/Fibrino Yes Wound Bed Granulation Amount: Small (1-33%) Exposed Structure Granulation Quality: Red Fascia Exposed: No Necrotic Amount: Large (67-100%) Fat Layer (Subcutaneous Tissue) Exposed: Yes Necrotic Quality: Adherent Slough Tendon Exposed: No Muscle Exposed: No Joint Exposed: No Bone Exposed: No Treatment Notes Wound #6 (Lower Leg) Wound Laterality: Left, Anterior Cleanser Soap and Water Discharge Instruction: May shower and wash wound with dial antibacterial soap and water prior to dressing change. Wound Cleanser Discharge Instruction: Cleanse the wound with wound cleanser prior to applying a clean dressing using gauze sponges, not tissue or cotton balls. Peri-Wound Care Zinc Oxide Ointment 30g tube Discharge Instruction: Apply Zinc Oxide to periwound with each dressing change Sween Lotion (Moisturizing lotion) Discharge  Instruction: Apply moisturizing lotion as directed Topical Primary Dressing Xeroform Occlusive Gauze Dressing, 4x4 in Discharge Instruction: Apply to wound bed as instructed Secondary Dressing ABD Pad, 5x9 Discharge Instruction: Apply over primary dressing as directed. Secured With Compression Wrap Kerlix Roll 4.5x3.1 (in/yd) Discharge Instruction: Apply Kerlix and Coban compression as directed. Coban Self-Adherent Wrap 4x5 (in/yd) Discharge Instruction: Apply over Kerlix as directed. Compression Stockings Add-Ons Electronic Signature(s) Signed: 02/06/2022 10:35:58 AM By: Erenest Blank Signed: 02/06/2022 3:58:21 PM By: Rhae Hammock RN Entered By: Erenest Blank on 02/05/2022 10:32:07 -------------------------------------------------------------------------------- Wound Assessment Details Patient Name: Date of Service: [name] [name], [name] [name] 02/05/2022 10:00 A M Medical Record Number: [id number] Patient Account Number: [id number] Date of Birth/Sex: Treating RN: [birth year] (88 y.o. [name], [name] Primary Care Min Tunnell: [name] Other Clinician: Referring Arley Garant: Treating Daquon Greenleaf/Extender: [name] in Treatment: 16 Wound Status Wound Number: 7 Primary T be determined o Etiology: Wound Location: Left, Lateral Lower Leg Wound Open Wounding Event: Skin Tear/Laceration Status: Date Acquired: 01/17/2022 Comorbid Arrhythmia, Congestive Heart Failure, Coronary Artery Disease, Weeks Of Treatment: 1 History: Hypertension, Peripheral Venous Disease, Osteoarthritis, Seizure Clustered Wound: No Disorder Photos Wound Measurements Length: (cm) 5.5 Width: (cm) 2 Depth: (cm) 0.1 Area: (cm) 8.639 Volume: (cm) 0.864 % Reduction in Area: 47.6% % Reduction in Volume: 47.6% Epithelialization: None Tunneling: No Undermining: No Wound Description Classification: Full Thickness Without Exposed Support Structures Wound Margin: Distinct,  outline attached Exudate Amount: Medium Exudate Type: Serosanguineous Exudate Color: red, brown Foul Odor After Cleansing: No Slough/Fibrino No Wound Bed Granulation Amount: Small (1-33%) Exposed Structure Granulation Quality: Red Fascia Exposed: No Necrotic Amount: Large (67-100%) Fat Layer (Subcutaneous Tissue) Exposed: Yes Necrotic Quality: Adherent Slough Tendon Exposed: No Muscle Exposed: No Joint Exposed: No Bone Exposed: No Treatment Notes Wound #7 (Lower Leg) Wound Laterality: Left, Lateral Cleanser  Soap and Water Discharge Instruction: May shower and wash wound with dial antibacterial soap and water prior to dressing change. Wound Cleanser Discharge Instruction: Cleanse the wound with wound cleanser prior to applying a clean dressing using gauze sponges, not tissue or cotton balls. Peri-Wound Care Zinc Oxide Ointment 30g tube Discharge Instruction: Apply Zinc Oxide to periwound with each dressing change Sween Lotion (Moisturizing lotion) Discharge Instruction: Apply moisturizing lotion as directed Topical Primary Dressing Xeroform Occlusive Gauze Dressing, 4x4 in Discharge Instruction: Apply to wound bed as instructed Secondary Dressing ABD Pad, 5x9 Discharge Instruction: Apply over primary dressing as directed. Secured With Compression Wrap Kerlix Roll 4.5x3.1 (in/yd) Discharge Instruction: Apply Kerlix and Coban compression as directed. Coban Self-Adherent Wrap 4x5 (in/yd) Discharge Instruction: Apply over Kerlix as directed. Compression Stockings Add-Ons Electronic Signature(s) Signed: 02/06/2022 10:35:58 AM By: Erenest Blank Signed: 02/06/2022 3:58:21 PM By: Rhae Hammock RN Entered By: Erenest Blank on 02/05/2022 10:31:46 -------------------------------------------------------------------------------- Wound Assessment Details Patient Name: Date of Service: [name] [name] 02/05/2022 10:00 A M Medical Record Number: [id number] Patient  Account Number: [id number] Date of Birth/Sex: Treating RN: 1925/04/04 (88 y.o. [name], [name] Primary Care Annastacia Duba: [name] Other Clinician: Referring Amory Zbikowski: Treating Tryniti Laatsch/Extender: [name] in Treatment: 16 Wound Status Wound Number: 8 Primary MASD Etiology: Wound Location: Left T Great oe Wound Open Wounding Event: Gradually Appeared Status: Date Acquired: 02/04/2022 Comorbid Arrhythmia, Congestive Heart Failure, Coronary Artery Disease, Weeks Of Treatment: 0 History: Hypertension, Peripheral Venous Disease, Osteoarthritis, Seizure Clustered Wound: No Disorder Photos Wound Measurements Length: (cm) 1 Width: (cm) 1 Depth: (cm) 0.1 Area: (cm) 0.785 Volume: (cm) 0.079 % Reduction in Area: 0% % Reduction in Volume: 0% Epithelialization: None Tunneling: No Undermining: No Wound Description Classification: Full Thickness With Exposed Support Structures Wound Margin: Distinct, outline attached Exudate Amount: Medium Exudate Type: Serosanguineous Exudate Color: red, brown Foul Odor After Cleansing: No Slough/Fibrino Yes Wound Bed Granulation Amount: Medium (34-66%) Exposed Structure Granulation Quality: Red, Pink Fascia Exposed: No Necrotic Amount: Medium (34-66%) Fat Layer (Subcutaneous Tissue) Exposed: Yes Necrotic Quality: Adherent Slough Tendon Exposed: No Muscle Exposed: No Joint Exposed: No Bone Exposed: No Treatment Notes Wound #8 (Toe Great) Wound Laterality: Left Cleanser Soap and Water Discharge Instruction: May shower and wash wound with dial antibacterial soap and water prior to dressing change. Wound Cleanser Discharge Instruction: Cleanse the wound with wound cleanser prior to applying a clean dressing using gauze sponges, not tissue or cotton balls. Peri-Wound Care Zinc Oxide Ointment 30g tube Discharge Instruction: Apply Zinc Oxide to periwound with each dressing change Sween Lotion (Moisturizing  lotion) Discharge Instruction: Apply moisturizing lotion as directed Topical Primary Dressing Xeroform Occlusive Gauze Dressing, 4x4 in Discharge Instruction: Apply to wound bed as instructed Secondary Dressing Woven Gauze Sponge, Non-Sterile 4x4 in Discharge Instruction: Apply over primary dressing as directed. Secured With Conforming Stretch Gauze Bandage, Sterile 2x75 (in/in) Discharge Instruction: Secure with stretch gauze as directed. 91M Medipore H Soft Cloth Surgical T ape, 4 x 10 (in/yd) Discharge Instruction: Secure with tape as directed. Compression Wrap Compression Stockings Add-Ons Electronic Signature(s) Signed: 02/06/2022 10:35:58 AM By: Erenest Blank Signed: 02/06/2022 3:58:21 PM By: Rhae Hammock RN Entered By: Erenest Blank on 02/05/2022 10:30:53 -------------------------------------------------------------------------------- [place] Details Patient Name: Date of Service: [name] [name]. 02/05/2022 10:00 A M Medical Record Number: [id number] Patient Account Number: [id number] Date of Birth/Sex: Treating RN: 1924/08/06 (88 y.o. [name], [name] Primary Care Pace Lamadrid: [name] Other Clinician: Referring Saydee Zolman: Treating Norita Meigs/Extender: Worthy Keeler  [name] Weeks in Treatment: 16 Vital Signs Time Taken: 10:11 Temperature (F): 97.9 Pulse (bpm): 61 Respiratory Rate (breaths/min): 18 Blood Pressure (mmHg): 123/58 Reference Range: 80 - 120 mg / dl Electronic Signature(s) Signed: 02/06/2022 3:58:21 PM By: Rhae Hammock RN Entered By: Rhae Hammock on 02/05/2022 10:11:44

## 2022-02-11 ENCOUNTER — Encounter: Payer: Self-pay | Admitting: Internal Medicine

## 2022-02-11 ENCOUNTER — Non-Acute Institutional Stay (SKILLED_NURSING_FACILITY): Payer: Medicare HMO | Admitting: Internal Medicine

## 2022-02-11 DIAGNOSIS — I480 Paroxysmal atrial fibrillation: Secondary | ICD-10-CM | POA: Diagnosis not present

## 2022-02-11 DIAGNOSIS — K219 Gastro-esophageal reflux disease without esophagitis: Secondary | ICD-10-CM | POA: Diagnosis not present

## 2022-02-11 DIAGNOSIS — I83019 Varicose veins of right lower extremity with ulcer of unspecified site: Secondary | ICD-10-CM | POA: Diagnosis not present

## 2022-02-11 DIAGNOSIS — I10 Essential (primary) hypertension: Secondary | ICD-10-CM

## 2022-02-11 DIAGNOSIS — E782 Mixed hyperlipidemia: Secondary | ICD-10-CM | POA: Diagnosis not present

## 2022-02-11 DIAGNOSIS — L97929 Non-pressure chronic ulcer of unspecified part of left lower leg with unspecified severity: Secondary | ICD-10-CM

## 2022-02-11 DIAGNOSIS — I83029 Varicose veins of left lower extremity with ulcer of unspecified site: Secondary | ICD-10-CM | POA: Diagnosis not present

## 2022-02-11 DIAGNOSIS — R569 Unspecified convulsions: Secondary | ICD-10-CM

## 2022-02-11 DIAGNOSIS — I5032 Chronic diastolic (congestive) heart failure: Secondary | ICD-10-CM | POA: Diagnosis not present

## 2022-02-11 DIAGNOSIS — Z8673 Personal history of transient ischemic attack (TIA), and cerebral infarction without residual deficits: Secondary | ICD-10-CM | POA: Diagnosis not present

## 2022-02-11 DIAGNOSIS — L97919 Non-pressure chronic ulcer of unspecified part of right lower leg with unspecified severity: Secondary | ICD-10-CM | POA: Diagnosis not present

## 2022-02-11 NOTE — Progress Notes (Signed)
Location:   [facility] Room Number: 39 A Place of Service:  SNF [phone]) Provider:  [name], MD  [name], MD  Patient Care Team: [name], MD as PCP - General (Internal Medicine) Gatha Mayer, MD as Consulting Physician (Gastroenterology) [name], [name], MD as Consulting Physician (Cardiology) [name], MD as Attending Physician (Internal Medicine) Mast, Man X, NP as Nurse Practitioner (Internal Medicine)  Extended Emergency Contact Information Primary Emergency Contact: Meadowcroft,[name] Address: [place],  [address] [name] of [place] Phone: [phone] Mobile Phone: [phone] Relation: Daughter Secondary Emergency Contact: Reino,[name] Address: [place] [address] [name] of [place] Phone: [phone] Mobile Phone: [phone] Relation: Son  Code Status:  DNR Goals of care: Advanced Directive information    02/11/2022   11:46 AM  Advanced Directives  Does Patient Have a Medical Advance Directive? Yes  Type of Advance Directive Living will;Out of facility DNR (pink MOST or yellow form)  Does patient want to make changes to medical advance directive? No - Patient declined  Pre-existing out of facility DNR order (yellow form or pink MOST form) Yellow form placed in chart (order not valid for inpatient use)     Chief Complaint  Patient presents with   Medical Management of Chronic Issues    Routine follow up   Immunizations    COVID vaccine,Shingrix vaccine, flu vaccine are due    HPI:  Pt is a 88 y.o. male seen today for medical management of chronic diseases.    Resident of SNF  Patient has a history of BPH, bradycardia,  CAD, hypertension, diastolic CHF,  GERD, H/o Left Sphenoid Meningioma And Chronic Seizures  ? TIA Is now on Higher dose of Aspirin per his request  His active issues  LE Bilateral Blisters with  open Wounds He is now seeing Wound care. His Cultures were positive for MRSA and Enterococcus And was treated with Oral Linezolid Per their Notes he is improving now Patient had no complains Continues to be stable Refuses Showers sometimes Wt Readings from Last 3 Encounters:  02/11/22 182 lb 1.6 oz (82.6 kg)  12/12/21 176 lb 14.4 oz (80.2 kg)  11/20/21 176 lb 14.4 oz (80.2 kg)    He is now Wheelchair dependent Need help with his transfers   Past Medical History:  Diagnosis Date   Allergic rhinitis    Amnesia    Atherosclerotic heart disease of native coronary artery without angina pectoris    BPH (benign prostatic hyperplasia)    Bradycardia    Chronic diastolic (congestive) heart failure (HCC)    Colon polyps    Coronary artery disease    MODERATE   Dizziness    Edema of lower extremity    Erectile dysfunction    H. pylori infection    Hx of    Hard of hearing    Headache(784.0)    History of colon polyps 2009   Dr. Orr/Colonoscopy -small cecal adenoma   History of falling    Hx of colonoscopy 2009   Dr Lajoyce Corners, hemorrhoids & polyps   Hyperlipidemia    Hypertension    Hypertensive heart disease with heart failure ([place])    Hypokalemia    Internal hemorrhoids 2009   Dr. Lajoyce Corners Colonoscopy   Metabolic encephalopathy    Mitral valve regurgitation    Muscle  weakness (generalized)    Pneumonia    Primary generalized (osteo)arthritis    Reflux esophagitis    Sepsis ([place]) 11/04/2018   Unspecified hemorrhoids    Unsteadiness on feet    Vitamin D deficiency    Past Surgical History:  Procedure Laterality Date   APPENDECTOMY     CARDIAC CATHETERIZATION  1997   REVEALED MILD TO MODERATE IRREGULARITIES. HIS LEFT EVNTRICULAR SYSTOLIC FUNCTION REVEALED NORMAL EJECTION FRACTION WITH EF OF 70%   CATARACT EXTRACTION, BILATERAL     COLONOSCOPY  multiple   fatty tumor removal     benign   HEMORROIDECTOMY     HIP ARTHROPLASTY Left 08/17/2013   Procedure: ARTHROPLASTY BIPOLAR HIP;   Surgeon: Gearlean Alf, MD;  Location: [facility];  Service: Orthopedics;  Laterality: Left;   INGUINAL HERNIA REPAIR Bilateral 2006   lower back surgery  2006   NASAL SEPTUM SURGERY     TONSILLECTOMY     UPPER GASTROINTESTINAL ENDOSCOPY      No Known Allergies  Allergies as of 02/11/2022   No Known Allergies      Medication List        Accurate as of February 11, 2022 11:58 AM. If you have any questions, ask your nurse or doctor.          STOP taking these medications    levofloxacin 500 MG tablet Commonly known as: LEVAQUIN Stopped by: [name], MD       TAKE these medications    acetaminophen 500 MG tablet Commonly known as: TYLENOL Take 500 mg by mouth every 8 (eight) hours as needed.   amLODipine 5 MG tablet Commonly known as: NORVASC Take 5 mg by mouth daily.   aspirin 325 MG tablet Take 325 mg by mouth daily.   CeraVe Crea Apply topically at bedtime. To lower extremities @ bedtime   cyanocobalamin 1000 MCG tablet Commonly known as: VITAMIN B12 Take 1,000 mcg by mouth daily.   DEXTRAN 70-HYPROMELLOSE OP Apply 1 drop to eye 3 (three) times daily as needed. Special Instructions: as needed for dry eyes/itching   hydrocortisone 2.5 % rectal cream Commonly known as: ANUSOL-HC Place 1 application rectally 2 (two) times daily as needed for anal itching or hemorrhoids (USE PERINEAL APPLICATOR). With perineal applicator   levETIRAcetam 500 MG tablet Commonly known as: KEPPRA Take 500 mg by mouth 2 (two) times daily.   lisinopril 40 MG tablet Commonly known as: ZESTRIL Take 40 mg by mouth daily.   omeprazole 20 MG capsule Commonly known as: PRILOSEC Take 20 mg by mouth daily.   potassium chloride SA 20 MEQ tablet Commonly known as: KLOR-CON M Take 20 mEq by mouth daily.   rosuvastatin 5 MG tablet Commonly known as: CRESTOR Take 5 mg by mouth daily.   Thera-M Tabs Take 1 tablet by mouth daily.   torsemide 20 MG tablet Commonly known  as: DEMADEX Take 20 mg by mouth daily.        Review of Systems  Constitutional:  Negative for activity change, appetite change and unexpected weight change.  HENT: Negative.    Respiratory:  Negative for cough and shortness of breath.   Cardiovascular:  Negative for leg swelling.  Gastrointestinal:  Negative for constipation.  Genitourinary:  Negative for frequency.  Musculoskeletal:  Positive for gait problem. Negative for arthralgias and myalgias.  Skin:  Positive for wound. Negative for rash.  Neurological:  Negative for dizziness and weakness.  Psychiatric/Behavioral:  Positive for confusion. Negative for sleep  disturbance.   All other systems reviewed and are negative.   Immunization History  Administered Date(s) Administered   Influenza, High Dose Seasonal PF 03/19/2019, 03/19/2019   Influenza-Unspecified 04/08/2016, 03/27/2020, 04/03/2021   Moderna Sars-Covid-2 Vaccination 06/18/2019, 07/16/2019, 04/24/2020, 11/13/2020   Pfizer Covid-19 Vaccine Bivalent Booster 49yr & up 03/05/2021   Pneumococcal Conjugate-13 06/02/2013   Pneumococcal Polysaccharide-23 04/07/2001   Tetanus 08/14/2013   Zoster Recombinat (Shingrix) 08/03/2021   Pertinent  Health Maintenance Due  Topic Date Due   INFLUENZA VACCINE  01/14/2022      09/21/2019    8:15 PM 09/22/2019    7:30 AM 09/22/2019    9:00 PM 09/23/2019    9:30 AM 01/12/2020    3:02 AM  Fall Risk  Patient Fall Risk Level High fall risk High fall risk High fall risk High fall risk High fall risk   Functional Status Survey:    Vitals:   02/11/22 1144  BP: 139/70  Pulse: 61  Resp: 16  Temp: (!) 97 F (36.1 C)  SpO2: 93%  Weight: 182 lb 1.6 oz (82.6 kg)  Height: '5\' 7"'$  (1.702 m)   Body mass index is 28.52 kg/m. Physical Exam Vitals reviewed.  Constitutional:      Appearance: Normal appearance.  HENT:     Head: Normocephalic.     Nose: Nose normal.     Mouth/Throat:     Mouth: Mucous membranes are moist.     Pharynx:  Oropharynx is clear.  Eyes:     Pupils: Pupils are equal, round, and reactive to light.  Cardiovascular:     Rate and Rhythm: Normal rate and regular rhythm.     Pulses: Normal pulses.     Heart sounds: No murmur heard. Pulmonary:     Effort: Pulmonary effort is normal. No respiratory distress.     Breath sounds: Normal breath sounds. No rales.  Abdominal:     General: Abdomen is flat. Bowel sounds are normal.     Palpations: Abdomen is soft.  Musculoskeletal:        General: No swelling.     Cervical back: Neck supple.  Skin:    General: Skin is warm.  Neurological:     General: No focal deficit present.     Mental Status: He is alert.  Psychiatric:        Mood and Affect: Mood normal.        Thought Content: Thought content normal.     Labs reviewed: Recent Labs    07/18/21 0000 10/15/21 0000 11/21/21 0000  NA 141 143 143  K 4.3 5.1 4.1  CL 102 106 108  CO2 31* 24* 30*  BUN 16 35* 22*  CREATININE 1.0 1.3 1.1  CALCIUM 9.3 9.6 8.7   Recent Labs    07/18/21 0000  AST 19  ALT 17  ALKPHOS 61  ALBUMIN 3.9   Recent Labs    07/18/21 0000 12/19/21 0000  WBC 7.4 8.2  NEUTROABS  --  4,674.00  HGB 14.1 12.5*  HCT 41 36*  PLT 223 218   Lab Results  Component Value Date   TSH 1.77 04/06/2020   No results found for: "HGBA1C" Lab Results  Component Value Date   CHOL 131 07/18/2021   HDL 57 07/18/2021   LDLCALC 61 07/18/2021   TRIG 57 07/18/2021   CHOLHDL 2.7 02/17/2017    Significant Diagnostic Results in last 30 days:  No results found.  Assessment/Plan 1. Venous ulcers of both lower extremities (  [place]) Follows with Wound Care Xeroform and Kerlix Wraps Was unable to see the wound but per Wound care they are doing better Patient is thinking to not go there anymore  2. History of TIA (transient ischemic attack) On Full dose of aspirin and statin  3. Primary hypertension On Amlodipine and Lisinopril  4. Chronic diastolic heart failure (HCC) On  Torsemide  5. Seizure ([place]) Continue Keppra  6. Gastroesophageal reflux disease, unspecified whether esophagitis present On Prilosec  7. Mixed hyperlipidemia On Statin LDL less then 100 in 2/23  8. PAF (paroxysmal atrial fibrillation) (HCC) Not on DOCa due to falls and Frailty On Aspirin     Family/ staff Communication:   Labs/tests ordered:

## 2022-02-12 ENCOUNTER — Encounter (HOSPITAL_BASED_OUTPATIENT_CLINIC_OR_DEPARTMENT_OTHER): Payer: Medicare HMO | Admitting: Physician Assistant

## 2022-02-12 DIAGNOSIS — L97822 Non-pressure chronic ulcer of other part of left lower leg with fat layer exposed: Secondary | ICD-10-CM | POA: Diagnosis not present

## 2022-02-12 DIAGNOSIS — I87332 Chronic venous hypertension (idiopathic) with ulcer and inflammation of left lower extremity: Secondary | ICD-10-CM | POA: Diagnosis not present

## 2022-02-12 DIAGNOSIS — I110 Hypertensive heart disease with heart failure: Secondary | ICD-10-CM | POA: Diagnosis not present

## 2022-02-12 DIAGNOSIS — I87331 Chronic venous hypertension (idiopathic) with ulcer and inflammation of right lower extremity: Secondary | ICD-10-CM | POA: Diagnosis not present

## 2022-02-12 DIAGNOSIS — L97318 Non-pressure chronic ulcer of right ankle with other specified severity: Secondary | ICD-10-CM | POA: Diagnosis not present

## 2022-02-12 DIAGNOSIS — G40909 Epilepsy, unspecified, not intractable, without status epilepticus: Secondary | ICD-10-CM | POA: Diagnosis not present

## 2022-02-12 DIAGNOSIS — I87333 Chronic venous hypertension (idiopathic) with ulcer and inflammation of bilateral lower extremity: Secondary | ICD-10-CM | POA: Diagnosis not present

## 2022-02-12 DIAGNOSIS — I5042 Chronic combined systolic (congestive) and diastolic (congestive) heart failure: Secondary | ICD-10-CM | POA: Diagnosis not present

## 2022-02-12 DIAGNOSIS — L97812 Non-pressure chronic ulcer of other part of right lower leg with fat layer exposed: Secondary | ICD-10-CM | POA: Diagnosis not present

## 2022-02-12 DIAGNOSIS — L97522 Non-pressure chronic ulcer of other part of left foot with fat layer exposed: Secondary | ICD-10-CM | POA: Diagnosis not present

## 2022-02-12 NOTE — Progress Notes (Addendum)
[name] [name], [name] [name] ([id number]) Visit Report for 02/12/2022 Chief Complaint Document Details Patient Name: Date of Service: [name] [name], [name] [name] 02/12/2022 8:00 A M Medical Record Number: [id number] Patient Account Number: [id number] Date of Birth/Sex: Treating RN: [birth year] (88 y.o. M) Primary Care Provider: [name] Other Clinician: Referring Provider: Treating Provider/Extender: [name] in Treatment: 17 Information Obtained from: Patient Chief Complaint Bilateral LE Ulcers Electronic Signature(s) Signed: 02/12/2022 8:17:08 AM By: Worthy Keeler PA-C Entered By: Worthy Keeler on 02/12/2022 08:17:07 -------------------------------------------------------------------------------- HPI Details Patient Name: Date of Service: [name] [name] 02/12/2022 8:00 A M Medical Record Number: [id number] Patient Account Number: [id number] Date of Birth/Sex: Treating RN: 12/22/1924 (88 y.o. M) Primary Care Provider: [name] Other Clinician: Referring Provider: Treating Provider/Extender: [name] in Treatment: 17 History of Present Illness HPI Description: ADMISSION 04/02/2021 This is a pleasant 88 year old man sent to our clinic by Dr. Lyndel Safe from the skilled unit of [facility]. He has a wound on the right posterior calf. I am uncertain about the duration of this however it looks to be chronic. They are using Santyl covered with silver alginate he does not have any compression. The wound is 100% covered in very fibrinous adherent slough. It also appears that this is quite painful. The patient is exceptionally hard of hearing so it is difficult to get any additional history out of him. Past medical history includes hypertension, coronary artery disease, congestive heart failure, BPH, seizure disorder. He is nonambulatory for reasons that are not totally clear. He lives at the [facility] unit of [facility]  [place]. ABI in our clinic was 1.03 on the right 11/2; 2-week follow-up. He is having the dressing changed at the facility. He arrives in the clinic with the area about the same in terms of surface area but better looking surface we have been using Iodoflex under 3 layer compression. Unfortunately we still do not have any history of exactly how long this is been there or how it happened. 11/16; 2-week follow-up. Nice improvement in the wound. He is using Iodoflex over 3 layer compression. 11/30 2-week follow-up. [facility] [place]. We have been using Iodoflex under 3 layer compression. They are not wrapping his leg properly but nevertheless the wound area has come down nicely. The patient requests coming here every 3 weeks instead of every 2 12/21; the area on the posterior right calf is fully epithelialized. The patient has chronic venous insufficiency. Readmission: 10-16-2021 upon evaluation today patient appears to be doing somewhat poorly in regard to the wounds on the bilateral lower extremities. He has been tolerating the dressing changes without complication. Fortunately there does not appear to be any evidence of active infection locally or systemically which is great news. Overall I am extremely pleased with where things stand. With that being said that obviously he has quite a bit of swelling and this is led to several areas of significant blistering over his bilateral lower extremities all the wounds are superficial this is the good news. Patient continues to have issues with chronic venous hypertension bilaterally with ulcerations. He also has a history of hypertension and congestive heart failure. He previously tolerated 3 layer compression wraps without complication. He was last seen and discharged in our office June 05, 2021. 10-23-2021 upon evaluation today patient appears to be doing excellent in regard to his legs. They do look better although there is  a lot  of bright green drainage I do believe this likely represents a Pseudomonas infection and that was discussed with him as best I could today again he is extremely hard of hearing. I had to write most of what I wanted him to know down. 10-30-2021 upon evaluation 20 today patient appears to be doing much better in regard to his leg ulcers. He has been taking the Levaquin without complication and overall very pleased in that regard. Fortunately I do not see any evidence of infection locally or systemically which is great news. No fevers, chills, nausea, vomiting, or diarrhea. 11-06-2021 upon evaluation today patient appears to be doing excellent in regard to his wounds. In fact the left leg is almost completely healed the right leg is significantly smaller. Overall I do believe we are on the right track here. 6/7; 2-week follow-up. The patient's left leg is essentially closed. His superficial areas anteriorly look like they are doing very well with nice rims of epithelialization. UNFORTUNATELY he has a new wound on the right lateral malleolus the exact etiology of this is not really clear but he is mostly complaining of this wound. We have been using Xeroform under kerlix Coban The patient is in the [facility]. I am uncertain whether he has 20/30 below-knee stockings or not but he is definitely going to need them. 12-04-2021 upon evaluation today patient appears to be doing better in regard to his wound especially on the left side of the lower extremity although the right side he has a new blister healing of the other wounds are very close to complete closure. This is due to it not being wrapped appropriately from the knee down to the base of the toes. This has been in the orders for the past several visits and to be honest I am not really sure why its not being done as ordered. 12-18-2021 upon evaluation today patient appears to be doing well currently in regard to his  wounds. In fact he is showing signs of excellent improvement which is great news and in fact is only the right ankle which is still open. Overall everything else is doing awesome. I do not see any evidence of active infection locally or systemically at this time. 01-01-2022 upon evaluation today patient appears to be doing well with regard to his wound. He is overall showing signs of excellent improvement which is great news. Fortunately I do not see any evidence of active infection locally or systemically at this time. 01-15-2022 upon evaluation today patient unfortunately is extremely swollen on the left leg and has a new wound which is open at this point. Again I discussed with the patient that this is quite unfortunate especially in light of the fact that he is previously noted to have been completely healed when I saw him 2 weeks ago. Again I believe this is likely open due to the fact that he has not had impression on this leg the facility was supposed to have already ordered and started the compression socks I am not sure that ever happened but also is possible he may have been pushing the compression socks down I really do not know as the facility has not contacted [place] to relay any information whatsoever. 01-29-2022 upon evaluation today patient appears to be doing worse in regard to his legs he is not getting the compression that he needs at this point. Fortunately I do not see any signs of active infection locally or systemically at this  time. 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 The Proteus is being covered by the Cipro nonetheless. Or rather Levaquin. With that being said I think at this point I would  recommend discontinuing the Levaquin and likely switching the patient over to linezolid which I think is going to be an ideal thing that we will treat the infections across the board and what medication. I think this is good to be the best way to go. Subsequently working otherwise continue with the further wound care measures as previously noted he seems to be doing really well with that to be honest. 02-12-2022 upon evaluation today patient appears to be doing excellent in regard to his legs he was actually wrapped appropriately and seems to be doing great. His swelling is significantly down. I do not see any evidence of active infection locally or systemically at this time which is great news. Electronic Signature(s) Signed: 02/12/2022 8:31:40 AM By: Worthy Keeler PA-C Entered By: Worthy Keeler on 02/12/2022 08:31:40 -------------------------------------------------------------------------------- Physical Exam Details Patient Name: Date of Service: [name] [name], [name] [name] 02/12/2022 8:00 A M Medical Record Number: [id number] Patient Account Number: [id number] Date of Birth/Sex: Treating RN: 08-21-1924 (88 y.o. M) Primary Care Provider: [name] Other Clinician: Referring Provider: Treating Provider/Extender: Patricia Nettle, Ihor Gully in Treatment: 76 Constitutional Well-nourished and well-hydrated in no acute distress. Respiratory normal breathing without difficulty. Psychiatric this patient is [name] [name] to make decisions and demonstrates good insight into disease process. Alert and Oriented x 3. pleasant and cooperative. Notes Upon inspection patient's wound bed actually showed signs of good granulation and epithelization at this point. Fortunately I do not see any evidence of infection locally or systemically which is great news and overall I am extremely pleased With where we stand at this time.Marland Kitchen His wounds are much improved and in fact almost completely resolved compared  to last week's visit. He just has a small area on the left leg. Electronic Signature(s) Signed: 02/12/2022 6:03:16 PM By: Worthy Keeler PA-C Entered By: Worthy Keeler on 02/12/2022 08:34:03 -------------------------------------------------------------------------------- Physician Orders Details Patient Name: Date of Service: [name] [name], [name] [name] 02/12/2022 8:00 A M Medical Record Number: [id number] Patient Account Number: [id number] Date of Birth/Sex: Treating RN: 1924/09/22 (88 y.o. 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 - Wednesday w/ Jeri Cos and Allayne Butcher Rm # 9 Return A Other: - We are changing orders back to both legs being wrapped with kerlix and coban. Xeroform to all wounds. T be changed 3 x a week. o ***PLEASE  WRAP THE LEGS WITH THE KERLIX and COBAN FROM BOTTOM OF TOES TO JUST BELOW BEND OF KNEE. WE HAVE MADE MARKS ON BOTH LEGS OF WHERE TO START THE WRAP AND WHERE TO END*** ***PLEASE BRING COMPRESSION STOCKINGS TO NEXT VISIT**** Bathing/ Shower/ Hygiene May shower with protection but do not get wound dressing(s) wet. Edema Control -  Lymphedema / SCD / Other Elevate legs to the level of the heart or above for 30 minutes daily and/or when sitting, a frequency of: Avoid standing for long periods of time. Other Edema Control Orders/Instructions: - Continue wrapping both legs with kerlix and coban, secured with netting over top. Left leg is the only leg with a wound right now and will get xeroform to wound. Wound Treatment Wound #7 - Lower Leg Wound Laterality: Left, Lateral Cleanser: Soap and Water 3 x Per Week/15 Days Discharge Instructions: May shower and wash wound with dial antibacterial soap and water prior to dressing change. Cleanser: Wound Cleanser 3 x Per Week/15 Days Discharge Instructions: Cleanse the wound with wound cleanser prior to applying a clean dressing using gauze sponges, not tissue or cotton balls. Peri-Wound Care: Zinc Oxide Ointment 30g tube 3 x Per Week/15 Days Discharge Instructions: Apply Zinc Oxide to periwound with each dressing change Peri-Wound Care: Sween Lotion (Moisturizing lotion) 3 x Per Week/15 Days Discharge Instructions: Apply moisturizing lotion as directed Prim Dressing: Xeroform Occlusive Gauze Dressing, 4x4 in 3 x Per Week/15 Days ary Discharge Instructions: Apply to wound bed as instructed Secondary Dressing: ABD Pad, 5x9 3 x Per Week/15 Days Discharge Instructions: Apply over primary dressing as directed. Compression Wrap: Kerlix Roll 4.5x3.1 (in/yd) 3 x Per Week/15 Days Discharge Instructions: Apply Kerlix and Coban compression as directed. Compression Wrap: Coban Self-Adherent Wrap 4x5 (in/yd) 3 x Per Week/15 Days Discharge Instructions: Apply over Kerlix as directed. Electronic Signature(s) Signed: 02/12/2022 6:03:16 PM By: Worthy Keeler PA-C Signed: 02/14/2022 1:10:23 PM By: Rhae Hammock RN Entered By: Rhae Hammock on 02/12/2022 08:23:26 -------------------------------------------------------------------------------- Problem List Details Patient Name: Date of  Service: [name] [name], [name] [name] 02/12/2022 8:00 A M Medical Record Number: [id number] Patient Account Number: [id number] Date of Birth/Sex: Treating RN: Feb 08, 1925 (88 y.o. 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 11/20/2021 No Yes L97.812 Non-pressure chronic ulcer of other part of right lower leg with fat layer 10/16/2021 No Yes exposed [place] (primary) hypertension 10/16/2021 No Yes I50.42 Chronic combined systolic (congestive) and diastolic (congestive) heart failure 10/16/2021 No Yes Inactive Problems Resolved Problems Electronic Signature(s) Signed: 02/12/2022 8:17:00 AM By: Worthy Keeler PA-C Entered By: Worthy Keeler on 02/12/2022 08:17:00 -------------------------------------------------------------------------------- Progress Note Details Patient Name: Date of Service: [name] Fraise. 02/12/2022 8:00 A M Medical Record Number: [id number] Patient Account Number: [id number] Date of Birth/Sex: Treating RN: Mar 26, 1925 (88 y.o. M) Primary Care Provider: [name] Other Clinician: Referring Provider: Treating Provider/Extender: [name] in Treatment: 17 Subjective Chief Complaint Information obtained from Patient Bilateral LE Ulcers History of Present Illness (HPI) ADMISSION 04/02/2021  This is a pleasant 88 year old man sent to  our clinic by Dr. Lyndel Safe from the [facility] unit of [facility] [place] [facility]. He has a wound on the right posterior calf. I am uncertain about the duration of this however it looks to be chronic. They are using Santyl covered with silver alginate he does not have any compression. The wound is 100% covered in very fibrinous adherent slough. It also appears that this is quite painful. The patient is exceptionally hard of hearing so it is difficult to get any additional history out of him. Past medical history includes hypertension, coronary artery disease, congestive heart failure, BPH, seizure disorder. He is nonambulatory for reasons that are not totally clear. He lives at the skilled unit of [facility]. ABI in our clinic was 1.03 on the right 11/2; 2-week follow-up. He is having the dressing changed at the facility. He arrives in the clinic with the area about the same in terms of surface area but better looking surface we have been using Iodoflex under 3 layer compression. Unfortunately we still do not have any history of exactly how long this is been there or how it happened. 11/16; 2-week follow-up. Nice improvement in the wound. He is using Iodoflex over 3 layer compression. 11/30 2-week follow-up. [facility] is at the [facility] [place]. We have been using Iodoflex under 3 layer compression. They are not wrapping his leg properly but nevertheless the wound area has come down nicely. The patient requests coming here every 3 weeks instead of every 2 12/21; the area on the posterior right calf is fully epithelialized. The patient has chronic venous insufficiency. Readmission: 10-16-2021 upon evaluation today patient appears to be doing somewhat poorly in regard to the wounds on the bilateral lower extremities. He has been tolerating the dressing changes without complication. Fortunately there does not appear to be any evidence of active infection locally or systemically which is  great news. Overall I am extremely pleased with where things stand. With that being said that obviously he has quite a bit of swelling and this is led to several areas of significant blistering over his bilateral lower extremities all the wounds are superficial this is the good news. Patient continues to have issues with chronic venous hypertension bilaterally with ulcerations. He also has a history of hypertension and congestive heart failure. He previously tolerated 3 layer compression wraps without complication. He was last seen and discharged in our office June 05, 2021. 10-23-2021 upon evaluation today patient appears to be doing excellent in regard to his legs. They do look better although there is a lot of bright green drainage I do believe this likely represents a Pseudomonas infection and that was discussed with him as best I could today again he is extremely hard of hearing. I had to write most of what I wanted him to know down. 10-30-2021 upon evaluation 20 today patient appears to be doing much better in regard to his leg ulcers. He has been taking the Levaquin without complication and overall very pleased in that regard. Fortunately I do not see any evidence of infection locally or systemically which is great news. No fevers, chills, nausea, vomiting, or diarrhea. 11-06-2021 upon evaluation today patient appears to be doing excellent in regard to his wounds. In fact the left leg is almost completely healed the right leg is significantly smaller. Overall I do believe we are on the right track here. 6/7; 2-week follow-up. The patient's left leg is essentially closed.  His superficial areas anteriorly look like they are doing very well with nice rims of epithelialization. UNFORTUNATELY he has a new wound on the right lateral malleolus the exact etiology of this is not really clear but he is mostly complaining of this wound. We have been using Xeroform under kerlix Coban The patient is in  the [facility]. I am uncertain whether he has 20/30 below-knee stockings or not but he is definitely going to need them. 12-04-2021 upon evaluation today patient appears to be doing better in regard to his wound especially on the left side of the lower extremity although the right side he has a new blister healing of the other wounds are very close to complete closure. This is due to it not being wrapped appropriately from the knee down to the base of the toes. This has been in the orders for the past several visits and to be honest I am not really sure why its not being done as ordered. 12-18-2021 upon evaluation today patient appears to be doing well currently in regard to his wounds. In fact he is showing signs of excellent improvement which is great news and in fact is only the right ankle which is still open. Overall everything else is doing awesome. I do not see any evidence of active infection locally or systemically at this time. 01-01-2022 upon evaluation today patient appears to be doing well with regard to his wound. He is overall showing signs of excellent improvement which is great news. Fortunately I do not see any evidence of active infection locally or systemically at this time. 01-15-2022 upon evaluation today patient unfortunately is extremely swollen on the left leg and has a new wound which is open at this point. Again I discussed with the patient that this is quite unfortunate especially in light of the fact that he is previously noted to have been completely healed when I saw him 2 weeks ago. Again I believe this is likely open due to the fact that he has not had impression on this leg the facility was supposed to have already ordered and started the compression socks I am not sure that ever happened but also is possible he may have been pushing the compression socks down I really do not know as the facility has not contacted [place] to relay any information  whatsoever. 01-29-2022 upon evaluation today patient appears to be doing worse in regard to his legs he is not getting the compression that he needs at this point. Fortunately I do not see any signs of active infection locally or systemically at this time. 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 The Proteus is being covered by the Cipro nonetheless. Or rather Levaquin. With that being said I think at this point I would recommend discontinuing the Levaquin and likely switching the patient over to linezolid which I think is going to be an ideal thing that we will treat the infections across the board and what medication. I think this is good to be the best way to go. Subsequently working otherwise continue with the further wound care measures as previously noted he seems to be doing really well with that  to be honest. 02-12-2022 upon evaluation today patient appears to be doing excellent in regard to his legs he was actually wrapped appropriately and seems to be doing great. His swelling is significantly down. I do not see any evidence of active infection locally or systemically at this time which is great news. Objective Constitutional Well-nourished and well-hydrated in no acute distress. Vitals Time Taken: 8:18 AM, Temperature: 98.7 F, Pulse: 62 bpm, Respiratory Rate: 17 breaths/min, Blood Pressure: 134/50 mmHg. Respiratory normal breathing without difficulty. Psychiatric this patient is [name] [name] to make decisions and demonstrates good insight into disease process. Alert and Oriented x 3. pleasant and cooperative. General Notes: Upon inspection patient's wound bed actually showed signs of good granulation and epithelization  at this point. Fortunately I do not see any evidence of infection locally or systemically which is great news and overall I am extremely pleased With where we stand at this time.Marland Kitchen His wounds are much improved and in fact almost completely resolved compared to last week's visit. He just has a small area on the left leg. Integumentary (Hair, Skin) Wound #4 status is Open. Original cause of wound was Gradually Appeared. The date acquired was: 11/20/2021. The wound has been in treatment 12 weeks. The wound is located on the Right,Lateral Ankle. The wound measures 0cm length x 0cm width x 0cm depth; 0cm^2 area and 0cm^3 volume. There is no tunneling or undermining noted. There is a medium amount of serosanguineous drainage noted. The wound margin is distinct with the outline attached to the wound base. There is no granulation within the wound bed. There is a large (67-100%) amount of necrotic tissue within the wound bed including Eschar and Adherent Slough. Wound #6 status is Open. Original cause of wound was Blister. The date acquired was: 01/15/2022. The wound has been in treatment 4 weeks. The wound is located on the Left,Anterior Lower Leg. The wound measures 0cm length x 0cm width x 0cm depth; 0cm^2 area and 0cm^3 volume. There is Fat Layer (Subcutaneous Tissue) exposed. There is a medium amount of serosanguineous drainage noted. The wound margin is distinct with the outline attached to the wound base. There is small (1-33%) red granulation within the wound bed. There is a large (67-100%) amount of necrotic tissue within the wound bed including Adherent Slough. Wound #7 status is Open. Original cause of wound was Skin T ear/Laceration. The date acquired was: 01/17/2022. The wound has been in treatment 2 weeks. The wound is located on the Left,Lateral Lower Leg. The wound measures 1cm length x 0.5cm width x 0.1cm depth; 0.393cm^2 area and 0.039cm^3 volume. There is Fat Layer (Subcutaneous Tissue) exposed.  There is no tunneling or undermining noted. There is a medium amount of serosanguineous drainage noted. The wound margin is distinct with the outline attached to the wound base. There is small (1-33%) red granulation within the wound bed. There is a large (67-100%) amount of necrotic tissue within the wound bed including Adherent Slough. Wound #8 status is Open. Original cause of wound was Gradually Appeared. The date acquired was: 02/04/2022. The wound has been in treatment 1 weeks. The wound is located on the Left T Great. The wound measures 0cm length x 0cm width x 0cm depth; 0cm^2 area and 0cm^3 volume. There is Fat Layer oe (Subcutaneous Tissue) exposed. There is no tunneling or undermining noted. There is a medium amount of serosanguineous drainage noted. The wound margin is distinct with the outline attached to the wound base. There is medium (34-66%) red,  pink granulation within the wound bed. There is a medium (34-66%) amount of necrotic tissue within the wound bed including Adherent Slough. 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 - Wednesday w/ Jeri Cos and Allayne Butcher Rm # 9 Other: - We are changing orders back to both legs being wrapped with kerlix and coban. Xeroform to all wounds. T be changed 3 x a week. ***PLEASE WRAP o THE LEGS WITH THE KERLIX and COBAN FROM BOTTOM OF TOES TO JUST BELOW BEND OF KNEE.  WE HAVE MADE MARKS ON BOTH LEGS OF WHERE TO START THE WRAP AND WHERE TO END*** ***PLEASE BRING COMPRESSION STOCKINGS TO NEXT VISIT**** Bathing/ Shower/ Hygiene: May shower with protection but do not get wound dressing(s) wet. Edema Control - Lymphedema / SCD / Other: Elevate legs to the level of the heart or above for 30 minutes daily and/or when sitting, a frequency of: Avoid standing for long periods of time. Other Edema Control Orders/Instructions: - Continue wrapping both legs with kerlix and coban, secured with netting over top. Left leg is the only leg with a wound right now and will get xeroform to wound. WOUND #7: - Lower Leg Wound Laterality: Left, Lateral Cleanser: Soap and Water 3 x Per Week/15 Days Discharge Instructions: May shower and wash wound with dial antibacterial soap and water prior to dressing change. Cleanser: Wound Cleanser 3 x Per Week/15 Days Discharge Instructions: Cleanse the wound with wound cleanser prior to applying a clean dressing using gauze sponges, not tissue or cotton balls. Peri-Wound Care: Zinc Oxide Ointment 30g tube 3 x Per Week/15 Days Discharge Instructions: Apply Zinc Oxide to periwound with each dressing change Peri-Wound Care: Sween Lotion (Moisturizing lotion) 3 x Per Week/15 Days Discharge Instructions: Apply moisturizing lotion as directed Prim Dressing: Xeroform Occlusive Gauze Dressing, 4x4 in 3 x Per Week/15 Days ary Discharge Instructions: Apply to wound bed as instructed Secondary Dressing: ABD Pad, 5x9 3 x Per Week/15 Days Discharge Instructions: Apply over primary dressing as directed. Com pression Wrap: Kerlix Roll 4.5x3.1 (in/yd) 3 x Per Week/15 Days Discharge Instructions: Apply Kerlix and Coban compression as directed. Com pression Wrap: Coban Self-Adherent Wrap 4x5 (in/yd) 3 x Per Week/15 Days Discharge Instructions: Apply over Kerlix as directed. 1. I am going to recommend that we go ahead and continue with the wound care  measures as before. For the area on the left leg we continue with the Xeroform followed by ABD pad. 2. With regard to the right leg we will continue with the compression wrapping which I think is still doing well with doing the Curlex and Coban wraps bilaterally. 3. I would recommend as well he should continue to elevate his legs I am hopeful the next times We see him in 2 weeks he will be completely healed. We will see patient back for reevaluation in 1 week here in the clinic. If anything worsens or changes patient will contact our office for additional recommendations. Electronic Signature(s) Signed: 02/12/2022 8:36:09 AM By: Worthy Keeler PA-C Entered By: Worthy Keeler on  02/12/2022 08:36:09 -------------------------------------------------------------------------------- SuperBill Details Patient Name: Date of Service: KAMON, FAHR 02/12/2022 Medical Record Number: [id number] Patient Account Number: [id number] Date of Birth/Sex: Treating RN: [birth year] (88 y.o. 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 L97.812 Non-pressure chronic ulcer of other part of right lower leg with fat layer exposed I10 Essential (primary) hypertension I50.42 Chronic combined systolic (congestive) and diastolic (congestive) heart failure Facility Procedures CPT4 Code: [id number] Description: [phone] - WOUND CARE VISIT-LEV 5 EST PT Modifier: Quantity:  1 Physician Procedures : CPT4 Code Description Modifier [id number] - WC PHYS LEVEL 3 - EST PT ICD-10 Diagnosis Description I87.331 Chronic venous hypertension (idiopathic) with ulcer and inflammation of right lower extremity I87.332 Chronic venous hypertension (idiopathic)  with ulcer and inflammation of left lower extremity L97.822 Non-pressure chronic ulcer of other part of left lower leg with fat layer exposed L97.318 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

## 2022-02-14 NOTE — Progress Notes (Signed)
[name] [name], [name] [name] ([id number]) Visit Report for 02/12/2022 Arrival Information Details Patient Name: Date of Service: [name] [name], [name] [name] 02/12/2022 8:00 A M Medical Record Number: [id number] Patient Account Number: [id number] Date of Birth/Sex: Treating RN: Mar 22, 1925 (88 y.o. [name] [name], [name] [name] Primary Care [name] [name]: [name] [name] Other Clinician: Referring [name] [name]: Treating [name] [name]: [name] [name] in Treatment: 8 Visit Information History Since Last Visit Added or deleted any medications: No Patient Arrived: Wheel Chair Any new allergies or adverse reactions: No Arrival Time: 08:03 Had a fall or experienced change in No Accompanied By: self activities of daily living that may affect Transfer Assistance: Manual risk of falls: Patient Identification Verified: Yes Signs or symptoms of abuse/neglect since last visito No Secondary Verification Process Completed: Yes Hospitalized since last visit: No Patient Requires Transmission-Based Precautions: No Implantable device outside of the clinic excluding No Patient Has Alerts: Yes cellular tissue based products placed in the center Patient Alerts: ABI's 05/23 R: N/C since last visit: Has Dressing in Place as Prescribed: Yes Has Compression in Place as Prescribed: Yes Pain Present Now: No Electronic Signature(s) Signed: 02/14/2022 1:10:23 PM By: [name] Hammock RN Entered By: [name] [name] on 02/12/2022 08:03:56 -------------------------------------------------------------------------------- Clinic Level of Care Assessment Details Patient Name: Date of Service: [name] [name], [name] [name] 02/12/2022 8:00 A M Medical Record Number: [id number] Patient Account Number: [id number] Date of Birth/Sex: Treating RN: 1924-07-01 (88 y.o. [name] [name], [name] [name] Primary Care [name] [name]: [name] [name] Other Clinician: Referring [name] [name]: Treating [name] [name]/Extender: [name] [name] in  Treatment: 17 Clinic Level of Care Assessment Items TOOL 4 Quantity Score X- 1 0 Use when only an EandM is performed on FOLLOW-UP visit ASSESSMENTS - Nursing Assessment / Reassessment X- 1 10 Reassessment of Co-morbidities (includes updates in patient status) X- 1 5 Reassessment of Adherence to Treatment Plan ASSESSMENTS - Wound and Skin A ssessment / Reassessment '[]'$  - 0 Simple Wound Assessment / Reassessment - one wound X- 4 5 Complex Wound Assessment / Reassessment - multiple wounds '[]'$  - 0 Dermatologic / Skin Assessment (not related to wound area) ASSESSMENTS - Focused Assessment X- 2 5 Circumferential Edema Measurements - multi extremities '[]'$  - 0 Nutritional Assessment / Counseling / Intervention '[]'$  - 0 Lower Extremity Assessment (monofilament, tuning fork, pulses) '[]'$  - 0 Peripheral Arterial Disease Assessment (using hand held doppler) ASSESSMENTS - Ostomy and/or Continence Assessment and Care '[]'$  - 0 Incontinence Assessment and Management '[]'$  - 0 Ostomy Care Assessment and Management (repouching, etc.) PROCESS - Coordination of Care '[]'$  - 0 Simple Patient / Family Education for ongoing care X- 1 20 Complex (extensive) Patient / Family Education for ongoing care X- 1 10 Staff obtains Programmer, systems, Records, T Results / Process Orders est '[]'$  - 0 Staff telephones HHA, Nursing Homes / Clarify orders / etc '[]'$  - 0 Routine Transfer to another Facility (non-emergent condition) '[]'$  - 0 Routine Hospital Admission (non-emergent condition) '[]'$  - 0 New Admissions / Biomedical engineer / Ordering NPWT Apligraf, etc. , '[]'$  - 0 Emergency Hospital Admission (emergent condition) '[]'$  - 0 Simple Discharge Coordination X- 1 15 Complex (extensive) Discharge Coordination PROCESS - Special Needs '[]'$  - 0 Pediatric / Minor Patient Management '[]'$  - 0 Isolation Patient Management '[]'$  - 0 Hearing / Language / Visual special needs '[]'$  - 0 Assessment of Community assistance (transportation,  D/C planning, etc.) '[]'$  - 0 Additional assistance / Altered mentation '[]'$  - 0 Support Surface(s) Assessment (bed, cushion, seat, etc.) INTERVENTIONS - Wound Cleansing / Measurement '[]'$  - 0 Simple  Wound Cleansing - one wound X- 4 5 Complex Wound Cleansing - multiple wounds X- 1 5 Wound Imaging (photographs - any number of wounds) '[]'$  - 0 Wound Tracing (instead of photographs) '[]'$  - 0 Simple Wound Measurement - one wound X- 4 5 Complex Wound Measurement - multiple wounds INTERVENTIONS - Wound Dressings '[]'$  - 0 Small Wound Dressing one or multiple wounds X- 4 15 Medium Wound Dressing one or multiple wounds '[]'$  - 0 Large Wound Dressing one or multiple wounds X- 1 5 Application of Medications - topical '[]'$  - 0 Application of Medications - injection INTERVENTIONS - Miscellaneous '[]'$  - 0 External ear exam '[]'$  - 0 Specimen Collection (cultures, biopsies, blood, body fluids, etc.) '[]'$  - 0 Specimen(s) / Culture(s) sent or taken to Lab for analysis '[]'$  - 0 Patient Transfer (multiple staff / Civil Service fast streamer / Similar devices) '[]'$  - 0 Simple Staple / Suture removal (25 or less) '[]'$  - 0 Complex Staple / Suture removal (26 or more) '[]'$  - 0 Hypo / Hyperglycemic Management (close monitor of Blood Glucose) '[]'$  - 0 Ankle / Brachial Index (ABI) - do not check if billed separately X- 1 5 Vital Signs Has the patient been seen at the hospital within the last three years: Yes Total Score: 205 Level Of Care: New/Established - Level 5 Electronic Signature(s) Signed: 02/14/2022 1:10:23 PM By: [name] Hammock RN Entered By: [name] [name] on 02/12/2022 08:26:37 -------------------------------------------------------------------------------- Encounter Discharge Information Details Patient Name: Date of Service: [name] Fraise. 02/12/2022 8:00 A M Medical Record Number: [id number] Patient Account Number: [id number] Date of Birth/Sex: Treating RN: 08-08-1924 (88 y.o. [name] [name], [name] [name] Primary Care  [name] [name]: [name] [name] Other Clinician: Referring Jayion Schneck: Treating [name] [name]/Extender: [name] [name] in Treatment: 17 Encounter Discharge Information Items Discharge Condition: Stable Ambulatory Status: Wheelchair Discharge Destination: Home Transportation: Private Auto Accompanied By: self Schedule Follow-up Appointment: Yes Clinical Summary of Care: Patient Declined Electronic Signature(s) Signed: 02/14/2022 1:10:23 PM By: [name] Hammock RN Entered By: [name] [name] on 02/12/2022 08:42:39 -------------------------------------------------------------------------------- Lower Extremity Assessment Details Patient Name: Date of Service: [name] Fraise. 02/12/2022 8:00 A M Medical Record Number: [id number] Patient Account Number: [id number] Date of Birth/Sex: Treating RN: 1925-02-10 (88 y.o. [name] [name], [name] [name] Primary Care [name] [name] [name] [name]: [name] [name] Other Clinician: Referring Zerah Hilyer: Treating Hayden Kihara/Extender: Patricia Nettle, Meredith Staggers Weeks in Treatment: 17 Edema Assessment Assessed: Shirlyn Goltz: Yes] Patrice Paradise: Yes] Edema: [Left: Yes] [Right: Yes] Calf Left: Right: Point of Measurement: 34 cm From Medial Instep 38 cm 38.7 cm Ankle Left: Right: Point of Measurement: 9 cm From Medial Instep 24.3 cm 23 cm Vascular Assessment Pulses: Dorsalis Pedis Palpable: [Left:Yes] [Right:Yes] Posterior Tibial Palpable: [Left:Yes] [Right:Yes] Electronic Signature(s) Signed: 02/14/2022 1:10:23 PM By: [name] Hammock RN Entered By: [name] [name] on 02/12/2022 08:04:13 -------------------------------------------------------------------------------- Multi-Disciplinary Care Plan Details Patient Name: Date of Service: [name] Fraise. 02/12/2022 8:00 A M Medical Record Number: [id number] Patient Account Number: [id number] Date of Birth/Sex: Treating RN: August 29, 1924 (88 y.o. [name] [name], [name] [name] Primary Care Camran Keady: [name] [name] Other  Clinician: Referring Lyana Asbill: Treating Ahria Slappey/Extender: [name] [name] in Treatment: 17 Active Inactive Wound/Skin Impairment Nursing Diagnoses: Impaired tissue integrity Knowledge deficit related to ulceration/compromised skin integrity Goals: Patient will have a decrease in wound volume by X% from date: (specify in notes) Date Initiated: 10/16/2021 Target Resolution Date: 02/14/2022 Goal Status: Active Patient/caregiver will verbalize understanding of skin care regimen Date Initiated: 10/16/2021 Target Resolution Date: 02/14/2022 Goal Status: Active Ulcer/skin breakdown will have a volume reduction  of 30% by week 4 Date Initiated: 10/16/2021 Target Resolution Date: 02/14/2022 Goal Status: Active Interventions: Assess patient/caregiver ability to obtain necessary supplies Assess patient/caregiver ability to perform ulcer/skin care regimen upon admission and as needed Assess ulceration(s) every visit Notes: Electronic Signature(s) Signed: 02/14/2022 1:10:23 PM By: [name] Hammock RN Entered By: [name] [name] on 02/12/2022 08:23:33 -------------------------------------------------------------------------------- Pain Assessment Details Patient Name: Date of Service: [name] Fraise. 02/12/2022 8:00 A M Medical Record Number: [id number] Patient Account Number: [id number] Date of Birth/Sex: Treating RN: 1924/06/21 (88 y.o. [name] [name], [name] [name] Primary Care Raheim Beutler: [name] [name] Other Clinician: Referring Iyania Denne: Treating Lissy Deuser/Extender: [name] [name] in Treatment: 17 Active Problems Location of Pain Severity and Description of Pain Patient Has Paino No Site Locations Pain Management and Medication Current Pain Management: Electronic Signature(s) Signed: 02/14/2022 1:10:23 PM By: [name] Hammock RN Entered By: [name] [name] on 02/12/2022  08:04:03 -------------------------------------------------------------------------------- Patient/Caregiver Education Details Patient Name: Date of Service: [name] Fraise 8/30/2023andnbsp8:00 A M Medical Record Number: [id number] Patient Account Number: [id number] Date of Birth/Gender: Treating RN: [birth year] (88 y.o. Erie Noe Primary Care Physician: [name] [name] Other Clinician: Referring Physician: Treating Physician/Extender: [name] [name] in Treatment: 3 Education Assessment Education Provided To: Patient Education Topics Provided Wound/Skin Impairment: Methods: Explain/Verbal Responses: Reinforcements needed, State content correctly Electronic Signature(s) Signed: 02/14/2022 1:10:23 PM By: [name] Hammock RN Entered By: [name] [name] on 02/12/2022 08:23:54 -------------------------------------------------------------------------------- Wound Assessment Details Patient Name: Date of Service: [name] Fraise. 02/12/2022 8:00 A M Medical Record Number: [id number] Patient Account Number: [id number] Date of Birth/Sex: Treating RN: March 22, 1925 (88 y.o. Marcheta Grammes Primary Care Toneshia Coello: [name] [name] Other Clinician: Referring Miking Usrey: Treating Tacia Hindley/Extender: [name] [name] in Treatment: 17 Wound Status Wound Number: 4 Primary Venous Leg Ulcer Etiology: Wound Location: Right, Lateral Ankle Wound Open Wounding Event: Gradually Appeared Status: Date Acquired: 11/20/2021 Comorbid Arrhythmia, Congestive Heart Failure, Coronary Artery Disease, Weeks Of Treatment: 12 History: Hypertension, Peripheral Venous Disease, Osteoarthritis, Seizure Clustered Wound: No Disorder Photos Wound Measurements Length: (cm) Width: (cm) Depth: (cm) Area: (cm) Volume: (cm) 0 % Reduction in Area: 100% 0 % Reduction in Volume: 100% 0 Epithelialization: Small (1-33%) 0 Tunneling: No 0 Undermining:  No Wound Description Classification: Full Thickness With Exposed Support Structures Wound Margin: Distinct, outline attached Exudate Amount: Medium Exudate Type: Serosanguineous Exudate Color: red, brown Foul Odor After Cleansing: No Slough/Fibrino Yes Wound Bed Granulation Amount: None Present (0%) Necrotic Amount: Large (67-100%) Necrotic Quality: Eschar, Adherent Slough Electronic Signature(s) Signed: 02/12/2022 5:14:36 PM By: Lorrin Jackson Entered By: Lorrin Jackson on 02/12/2022 08:13:35 -------------------------------------------------------------------------------- Wound Assessment Details Patient Name: Date of Service: [name] Fraise. 02/12/2022 8:00 A M Medical Record Number: [id number] Patient Account Number: [id number] Date of Birth/Sex: Treating RN: 03-17-1925 (88 y.o. Marcheta Grammes Primary Care Legacy Lacivita: [name] [name] Other Clinician: Referring Mattea Seger: Treating Danett Palazzo/Extender: [name] [name] in Treatment: 17 Wound Status Wound Number: 6 Primary Venous Leg Ulcer Etiology: Wound Location: Left, Anterior Lower Leg Wound Open Wounding Event: Blister Status: Date Acquired: 01/15/2022 Comorbid Arrhythmia, Congestive Heart Failure, Coronary Artery Disease, Weeks Of Treatment: 4 History: Hypertension, Peripheral Venous Disease, Osteoarthritis, Seizure Clustered Wound: No Disorder Photos Wound Measurements Length: (cm) Width: (cm) Depth: (cm) Area: (cm) Volume: (cm) 0 % Reduction in Area: 100% 0 % Reduction in Volume: 100% 0 Epithelialization: Small (1-33%) 0 0 Wound Description Classification: Full Thickness Without Exposed Support Structures Wound Margin: Distinct, outline attached Exudate Amount:  Medium Exudate Type: Serosanguineous Exudate Color: red, brown Foul Odor After Cleansing: No Slough/Fibrino Yes Wound Bed Granulation Amount: Small (1-33%) Exposed Structure Granulation Quality: Red Fascia Exposed:  No Necrotic Amount: Large (67-100%) Fat Layer (Subcutaneous Tissue) Exposed: Yes Necrotic Quality: Adherent Slough Tendon Exposed: No Muscle Exposed: No Joint Exposed: No Bone Exposed: No Electronic Signature(s) Signed: 02/12/2022 5:14:36 PM By: Lorrin Jackson Entered By: Lorrin Jackson on 02/12/2022 08:14:22 -------------------------------------------------------------------------------- Wound Assessment Details Patient Name: Date of Service: [name] Fraise. 02/12/2022 8:00 A M Medical Record Number: [id number] Patient Account Number: [id number] Date of Birth/Sex: Treating RN: 1925-05-15 (88 y.o. Marcheta Grammes Primary Care Deaglan Lile: [name] [name] Other Clinician: Referring Ima Hafner: Treating Kylia Grajales/Extender: Patricia Nettle, Ihor Gully in Treatment: 17 Wound Status Wound Number: 7 Primary T be determined o Etiology: Etiology: Wound Location: Left, Lateral Lower Leg Wound Open Wounding Event: Skin Tear/Laceration Status: Date Acquired: 01/17/2022 Comorbid Arrhythmia, Congestive Heart Failure, Coronary Artery Disease, Weeks Of Treatment: 2 History: Hypertension, Peripheral Venous Disease, Osteoarthritis, Seizure Clustered Wound: No Disorder Photos Wound Measurements Length: (cm) 1 Width: (cm) 0.5 Depth: (cm) 0.1 Area: (cm) 0.393 Volume: (cm) 0.039 % Reduction in Area: 97.6% % Reduction in Volume: 97.6% Epithelialization: None Tunneling: No Undermining: No Wound Description Classification: Full Thickness Without Exposed Support Structures Wound Margin: Distinct, outline attached Exudate Amount: Medium Exudate Type: Serosanguineous Exudate Color: red, brown Foul Odor After Cleansing: No Slough/Fibrino No Wound Bed Granulation Amount: Small (1-33%) Exposed Structure Granulation Quality: Red Fascia Exposed: No Necrotic Amount: Large (67-100%) Fat Layer (Subcutaneous Tissue) Exposed: Yes Necrotic Quality: Adherent Slough Tendon Exposed:  No Muscle Exposed: No Joint Exposed: No Bone Exposed: No Treatment Notes Wound #7 (Lower Leg) Wound Laterality: Left, Lateral Cleanser Soap and Water Discharge Instruction: May shower and wash wound with dial antibacterial soap and water prior to dressing change. Wound Cleanser Discharge Instruction: Cleanse the wound with wound cleanser prior to applying a clean dressing using gauze sponges, not tissue or cotton balls. Peri-Wound Care Zinc Oxide Ointment 30g tube Discharge Instruction: Apply Zinc Oxide to periwound with each dressing change Sween Lotion (Moisturizing lotion) Discharge Instruction: Apply moisturizing lotion as directed Topical Primary Dressing Xeroform Occlusive Gauze Dressing, 4x4 in Discharge Instruction: Apply to wound bed as instructed Secondary Dressing ABD Pad, 5x9 Discharge Instruction: Apply over primary dressing as directed. Secured With Compression Wrap Kerlix Roll 4.5x3.1 (in/yd) Discharge Instruction: Apply Kerlix and Coban compression as directed. Coban Self-Adherent Wrap 4x5 (in/yd) Discharge Instruction: Apply over Kerlix as directed. Compression Stockings Add-Ons Electronic Signature(s) Signed: 02/12/2022 5:14:36 PM By: Lorrin Jackson Entered By: Lorrin Jackson on 02/12/2022 08:15:23 -------------------------------------------------------------------------------- Wound Assessment Details Patient Name: Date of Service: [name] [name], [name] [name] 02/12/2022 8:00 A M Medical Record Number: [id number] Patient Account Number: [id number] Date of Birth/Sex: Treating RN: 1925-03-18 (88 y.o. Marcheta Grammes Primary Care Edwards Mckelvie: [name] [name] Other Clinician: Referring Fariha Goto: Treating Shabre Kreher/Extender: [name] [name] in Treatment: 17 Wound Status Wound Number: 8 Primary MASD Etiology: Wound Location: Left T [place] oe Wound Open Wounding Event: Gradually Appeared Status: Date Acquired: 02/04/2022 Comorbid Arrhythmia,  Congestive Heart Failure, Coronary Artery Disease, Weeks Of Treatment: 1 History: Hypertension, Peripheral Venous Disease, Osteoarthritis, Seizure Clustered Wound: No Disorder Photos Wound Measurements Length: (cm) Width: (cm) Depth: (cm) Area: (cm) Volume: (cm) 0 % Reduction in Area: 100% 0 % Reduction in Volume: 100% 0 Epithelialization: None 0 Tunneling: No 0 Undermining: No Wound Description Classification: Full Thickness With Exposed Support Structures Wound Margin: Distinct, outline attached Exudate  Amount: Medium Exudate Type: Serosanguineous Exudate Color: red, brown Foul Odor After Cleansing: No Slough/Fibrino Yes Wound Bed Granulation Amount: Medium (34-66%) Exposed Structure Granulation Quality: Red, Pink Fascia Exposed: No Necrotic Amount: Medium (34-66%) Fat Layer (Subcutaneous Tissue) Exposed: Yes Necrotic Quality: Adherent Slough Tendon Exposed: No Muscle Exposed: No Joint Exposed: No Bone Exposed: No Electronic Signature(s) Signed: 02/12/2022 5:14:36 PM By: Lorrin Jackson Entered By: Lorrin Jackson on 02/12/2022 08:15:54 -------------------------------------------------------------------------------- Vitals Details Patient Name: Date of Service: [name] Fraise. 02/12/2022 8:00 A M Medical Record Number: [id number] Patient Account Number: [id number] Date of Birth/Sex: Treating RN: [birth year] (88 y.o. [name] [name], [name] [name] Primary Care [place]: [name] [name] Other Clinician: Referring Alazae Crymes: Treating Dynasti Kerman/Extender: [name] [name] in Treatment: 17 Vital Signs Time Taken: 08:18 Temperature (F): 98.7 Pulse (bpm): 62 Respiratory Rate (breaths/min): 17 Blood Pressure (mmHg): 134/50 Reference Range: 80 - 120 mg / dl Electronic Signature(s) Signed: 02/14/2022 1:10:23 PM By: [name] Hammock RN Entered By: [name] [name] on 02/12/2022 08:18:19

## 2022-02-26 ENCOUNTER — Encounter (HOSPITAL_BASED_OUTPATIENT_CLINIC_OR_DEPARTMENT_OTHER): Payer: Medicare HMO | Attending: Physician Assistant | Admitting: Physician Assistant

## 2022-02-26 DIAGNOSIS — L97822 Non-pressure chronic ulcer of other part of left lower leg with fat layer exposed: Secondary | ICD-10-CM | POA: Insufficient documentation

## 2022-02-26 DIAGNOSIS — I5042 Chronic combined systolic (congestive) and diastolic (congestive) heart failure: Secondary | ICD-10-CM | POA: Insufficient documentation

## 2022-02-26 DIAGNOSIS — L97318 Non-pressure chronic ulcer of right ankle with other specified severity: Secondary | ICD-10-CM | POA: Insufficient documentation

## 2022-02-26 DIAGNOSIS — I872 Venous insufficiency (chronic) (peripheral): Secondary | ICD-10-CM | POA: Diagnosis not present

## 2022-02-26 DIAGNOSIS — M1990 Unspecified osteoarthritis, unspecified site: Secondary | ICD-10-CM | POA: Insufficient documentation

## 2022-02-26 DIAGNOSIS — N400 Enlarged prostate without lower urinary tract symptoms: Secondary | ICD-10-CM | POA: Diagnosis not present

## 2022-02-26 DIAGNOSIS — G40909 Epilepsy, unspecified, not intractable, without status epilepticus: Secondary | ICD-10-CM | POA: Insufficient documentation

## 2022-02-26 DIAGNOSIS — I10 Essential (primary) hypertension: Secondary | ICD-10-CM | POA: Diagnosis not present

## 2022-02-26 DIAGNOSIS — I110 Hypertensive heart disease with heart failure: Secondary | ICD-10-CM | POA: Insufficient documentation

## 2022-02-26 DIAGNOSIS — S81812A Laceration without foreign body, left lower leg, initial encounter: Secondary | ICD-10-CM | POA: Diagnosis not present

## 2022-02-26 DIAGNOSIS — L97812 Non-pressure chronic ulcer of other part of right lower leg with fat layer exposed: Secondary | ICD-10-CM | POA: Diagnosis not present

## 2022-02-26 NOTE — Progress Notes (Addendum)
[name] [name], [name] [name] ([id number]) Visit Report for 02/26/2022 Chief Complaint Document Details Patient Name: Date of Service: [name] [name], [name] [name] 02/26/2022 8:00 A M Medical Record Number: [id number] Patient Account Number: [id number] Date of Birth/Sex: Treating RN: [name] [name] (88 y.o. M) Primary Care Provider: Alain [name] Other Clinician: Referring Provider: Treating Provider/Extender: [name] [name] in Treatment: 19 Information Obtained from: Patient Chief Complaint Bilateral LE Ulcers Electronic Signature(s) Signed: 02/26/2022 8:15:50 AM By: Worthy Keeler PA-C Entered By: Worthy [name] on 02/26/2022 08:15:50 -------------------------------------------------------------------------------- HPI Details Patient Name: Date of Service: [name] [name] 02/26/2022 8:00 A M Medical Record Number: [id number] Patient Account Number: [id number] Date of Birth/Sex: Treating RN: [birth year][name] (88 y.o. M) Primary Care Provider: Alain [name] Other Clinician: Referring Provider: Treating Provider/Extender: [name] [name] in Treatment: 19 History of Present Illness HPI Description: ADMISSION 04/02/2021 This is a pleasant 88 year old man sent to our clinic by Dr. Lyndel [name] from the [facility] of [facility] [place]. He has a wound on the right posterior calf. I am uncertain about the duration of this however it looks to be chronic. They are using Santyl covered with silver alginate he does not have any compression. The wound is 100% covered in very fibrinous adherent slough. It also appears that this is quite painful. The patient is exceptionally hard of hearing so it is difficult to get any additional history out of him. Past medical history includes hypertension, coronary artery disease, congestive heart failure, BPH, seizure disorder. He is nonambulatory for reasons that are not totally clear. He lives at the [facility]  [place]. ABI in our clinic was 1.03 on the right 11/2; 2-week follow-up. He is having the dressing changed at the facility. He arrives in the clinic with the area about the same in terms of surface area but better looking surface we have been using Iodoflex under 3 layer compression. Unfortunately we still do not have any history of exactly how long this is been there or how it happened. 11/16; 2-week follow-up. Nice improvement in the wound. He is using Iodoflex over 3 layer compression. 11/30 2-week follow-up. [facility] is [facility] [place]. We have been using Iodoflex under 3 layer compression. They are not wrapping his leg properly but nevertheless the wound area has come down nicely. The patient requests coming here every 3 weeks instead of every 2 12/21; the area on the posterior right calf is fully epithelialized. The patient has chronic venous insufficiency. Readmission: 10-16-2021 upon evaluation today patient appears to be doing somewhat poorly in regard to the wounds on the bilateral lower extremities. He has been tolerating the dressing changes without complication. Fortunately there does not appear to be any evidence of active infection locally or systemically which is great news. Overall I am extremely pleased with where things stand. With that being said that obviously he has quite a bit of swelling and this is led to several areas of significant blistering over his bilateral lower extremities all the wounds are superficial this is the good news. Patient continues to have issues with chronic venous hypertension bilaterally with ulcerations. He also has a history of hypertension and congestive heart failure. He previously tolerated 3 layer compression wraps without complication. He was last seen and discharged in our office June 05, 2021. 10-23-2021 upon evaluation today patient appears to be doing excellent in regard to his legs. They do look better although there is  a lot of bright green drainage I do believe this likely represents a Pseudomonas infection and that was discussed with him as best I could today again he is extremely hard of hearing. I had to write most of what I wanted him to know down. 10-30-2021 upon evaluation 20 today patient appears to be doing much better in regard to his leg ulcers. He has been taking the Levaquin without complication and overall very pleased in that regard. Fortunately I do not see any evidence of infection locally or systemically which is great news. No fevers, chills, nausea, vomiting, or diarrhea. 11-06-2021 upon evaluation today patient appears to be doing excellent in regard to his wounds. In fact the left leg is almost completely healed the right leg is significantly smaller. Overall I do believe we are on the right track here. 6/7; 2-week follow-up. The patient's left leg is essentially closed. His superficial areas anteriorly look like they are doing very well with nice rims of epithelialization. UNFORTUNATELY he has a new wound on the right lateral malleolus the exact etiology of this is not really clear but he is mostly complaining of this wound. We have been using Xeroform under kerlix Coban The patient is in the [facility] [place]. I am uncertain whether he has 20/30 below-knee stockings or not but he is definitely going to need them. 12-04-2021 upon evaluation today patient appears to be doing better in regard to his wound especially on the left side of the lower extremity although the right side he has a new blister healing of the other wounds are very close to complete closure. This is due to it not being wrapped appropriately from the knee down to the base of the toes. This has been in the orders for the past several visits and to be honest I am not really sure why its not being done as ordered. 12-18-2021 upon evaluation today patient appears to be doing well currently in regard to his  wounds. In fact he is showing signs of excellent improvement which is great news and in fact is only the right ankle which is still open. Overall everything else is doing awesome. I do not see any evidence of active infection locally or systemically at this time. 01-01-2022 upon evaluation today patient appears to be doing well with regard to his wound. He is overall showing signs of excellent improvement which is great news. Fortunately I do not see any evidence of active infection locally or systemically at this time. 01-15-2022 upon evaluation today patient unfortunately is extremely swollen on the left leg and has a new wound which is open at this point. Again I discussed with the patient that this is quite unfortunate especially in light of the fact that he is previously noted to have been completely healed when I saw him 2 weeks ago. Again I believe this is likely open due to the fact that he has not had impression on this leg the facility was supposed to have already ordered and started the compression socks I am not sure that ever happened but also is possible he may have been pushing the compression socks down I really do not know as the facility has not contacted [place] to relay any information whatsoever. 01-29-2022 upon evaluation today patient appears to be doing worse in regard to his legs he is not getting the compression that he needs at this point. Fortunately I do not see any signs of active infection locally or systemically  at this time. 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 The Proteus is being covered by the Cipro nonetheless. Or rather Levaquin. With that being said I think at this point I would  recommend discontinuing the Levaquin and likely switching the patient over to linezolid which I think is going to be an ideal thing that we will treat the infections across the board and what medication. I think this is good to be the best way to go. Subsequently working otherwise continue with the further wound care measures as previously noted he seems to be doing really well with that to be honest. 02-12-2022 upon evaluation today patient appears to be doing excellent in regard to his legs he was actually wrapped appropriately and seems to be doing great. His swelling is significantly down. I do not see any evidence of active infection locally or systemically at this time which is great news. 02-26-2022 upon evaluation today patient appears to be doing well currently in regard to his wounds. He has been tolerating the dressing changes without complication and everything appears to be completely healed based on what we are seeing currently. There does not appear to be any evidence of active infection at this time. Electronic Signature(s) Signed: 02/26/2022 8:36:32 AM By: Worthy Keeler PA-C Entered By: Worthy [name] on 02/26/2022 08:36:32 -------------------------------------------------------------------------------- Physical Exam Details Patient Name: Date of Service: [name] [name], [name] [name] 02/26/2022 8:00 A M Medical Record Number: [id number] Patient Account Number: [id number] Date of Birth/Sex: Treating RN: [birth year][name] (88 y.o. M) Primary Care Provider: Alain [name] Other Clinician: Referring Provider: Treating Provider/Extender: [name] [name] in Treatment: 26 Constitutional Well-nourished and well-hydrated in no acute distress. Respiratory normal breathing without difficulty. Psychiatric this patient is able to make decisions and demonstrates good insight into disease process. Alert and Oriented x 3. pleasant and cooperative. Notes Patient's wounds again  are showing signs of complete epithelization there is great skin growth here and there is no open wounds noted which is great news overall I am extremely pleased with where we stand. Electronic Signature(s) Signed: 02/26/2022 5:46:43 PM By: Worthy Keeler PA-C Entered By: Worthy [name] on 02/26/2022 08:36:48 -------------------------------------------------------------------------------- Physician Orders Details Patient Name: Date of Service: [name] [name], [name] [name] 02/26/2022 8:00 A M Medical Record Number: [id number] Patient Account Number: [id number] Date of Birth/Sex: Treating RN: [birth year] (88 y.o. 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 Patient to wear own compression stockings every day. - apply every morning and remove every night before bed Electronic Signature(s) Signed:  02/26/2022 5:46:43 PM By: Worthy Keeler PA-C Signed: 02/27/2022 4:08:15 PM By: Rhae Hammock RN Entered By: Rhae Hammock on 02/26/2022 08:19:00 -------------------------------------------------------------------------------- Problem List Details Patient Name: Date of Service: [name] [name]. 02/26/2022 8:00 A M Medical Record Number: [id number] Patient Account Number: [id number] Date of Birth/Sex: Treating RN: [birth year] (88 y.o. 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 02/26/2022 8:00 A M Medical Record Number: [id number] Patient Account Number: [id number] Date of Birth/Sex: Treating RN: [birth year] [name] (88 y.o. M) Primary Care Provider: Alain [name] Other  Clinician: Referring Provider: Treating Provider/Extender: [name] [name] in Treatment: 19 Subjective Chief Complaint Information obtained from Patient Bilateral LE Ulcers History of Present Illness (HPI) ADMISSION 04/02/2021 This is a pleasant 88 year old man sent to our clinic by Dr. Lyndel [name] from the skilled unit of [facility] [place]. He has a wound on the right posterior calf. I am uncertain about the duration of this however it looks to be chronic. They are using Santyl covered with silver alginate he does not have any compression. The wound is 100% covered in very fibrinous adherent slough. It also appears that this is quite painful. The patient is exceptionally hard of hearing so it is difficult to get any additional history out of him. Past medical history includes hypertension, coronary artery disease, congestive heart failure, BPH, seizure disorder. He is nonambulatory for reasons that are not totally clear. He lives at the [facility] unit of [facility] [place]. ABI in our clinic was 1.03 on the right 11/2; 2-week follow-up. He is having the dressing changed at the facility. He arrives in the clinic with the area about the same in terms of surface area but better looking surface we have been using Iodoflex under 3 layer compression. Unfortunately we still do not have any history of exactly how long this is been there or how it happened. 11/16; 2-week follow-up. Nice improvement in the wound. He is using Iodoflex over 3 layer compression. 11/30 2-week follow-up. [facility] is at the [facility] of [facility] [place]. We have been using Iodoflex under 3 layer compression. They are not wrapping his leg properly but nevertheless the wound area has come down nicely. The patient requests coming here every 3 weeks instead of every 2  12/21; the area on the posterior right calf is fully epithelialized. The patient has chronic venous insufficiency. Readmission: 10-16-2021 upon  evaluation today patient appears to be doing somewhat poorly in regard to the wounds on the bilateral lower extremities. He has been tolerating the dressing changes without complication. Fortunately there does not appear to be any evidence of active infection locally or systemically which is great news. Overall I am extremely pleased with where things stand. With that being said that obviously he has quite a bit of swelling and this is led to several areas of significant blistering over his bilateral lower extremities all the wounds are superficial this is the good news. Patient continues to have issues with chronic venous hypertension bilaterally with ulcerations. He also has a history of hypertension and congestive heart failure. He previously tolerated 3 layer compression wraps without complication. He was last seen and discharged in our office June 05, 2021. 10-23-2021 upon evaluation today patient appears to be doing excellent in regard to his legs. They do look better although there is a lot of bright green drainage I do believe this likely represents a Pseudomonas infection and that was discussed with him as best I could today again he is extremely hard of hearing. I had to write most of what I wanted him to know down. 10-30-2021 upon evaluation 20 today patient appears to be doing much better in regard to his leg ulcers. He has been taking the Levaquin without complication and overall very pleased in that regard. Fortunately I do not see any evidence of infection locally or systemically which is great news. No fevers, chills, nausea, vomiting, or diarrhea. 11-06-2021 upon evaluation today patient appears to be doing excellent in regard to his wounds. In fact the left leg is almost completely healed the right leg is significantly smaller. Overall I do believe we are on the right track here. 6/7; 2-week follow-up. The patient's left leg is essentially closed. His superficial areas anteriorly  look like they are doing very well with nice rims of epithelialization. UNFORTUNATELY he has a new wound on the right lateral malleolus the exact etiology of this is not really clear but he is mostly complaining of this wound. We have been using Xeroform under kerlix Coban The patient is in the [facility] [place]. I am uncertain whether he has 20/30 below-knee stockings or not but he is definitely going to need them. 12-04-2021 upon evaluation today patient appears to be doing better in regard to his wound especially on the left side of the lower extremity although the right side he has a new blister healing of the other wounds are very close to complete closure. This is due to it not being wrapped appropriately from the knee down to the base of the toes. This has been in the orders for the past several visits and to be honest I am not really sure why its not being done as ordered. 12-18-2021 upon evaluation today patient appears to be doing well currently in regard to his wounds. In fact he is showing signs of excellent improvement which is great news and in fact is only the right ankle which is still open. Overall everything else is doing awesome. I do not see any evidence of active infection locally or systemically at this time. 01-01-2022 upon evaluation today patient appears to be doing well with regard to his wound. He is overall showing signs of excellent improvement which is great news. Fortunately I do  not see any evidence of active infection locally or systemically at this time. 01-15-2022 upon evaluation today patient unfortunately is extremely swollen on the left leg and has a new wound which is open at this point. Again I discussed with the patient that this is quite unfortunate especially in light of the fact that he is previously noted to have been completely healed when I saw him 2 weeks ago. Again I believe this is likely open due to the fact that he has not had  impression on this leg the facility was supposed to have already ordered and started the compression socks I am not sure that ever happened but also is possible he may have been pushing the compression socks down I really do not know as the facility has not contacted [place] to relay any information whatsoever. 01-29-2022 upon evaluation today patient appears to be doing worse in regard to his legs he is not getting the compression that he needs at this point. Fortunately I do not see any signs of active infection locally or systemically at this time. 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 The Proteus is being covered by the Cipro nonetheless. Or rather Levaquin. With that being said I think at this point I would recommend discontinuing the Levaquin and likely switching the patient over to linezolid which I think is going to be an ideal thing that we will treat the infections across the board and what medication. I think this is good to be the best way to go. Subsequently working otherwise continue with the further wound care measures as previously noted he seems to be doing really well with that to be honest. 02-12-2022 upon evaluation today patient appears to be doing excellent in regard to his legs he was actually wrapped appropriately and seems to be doing great. His swelling is significantly down. I do not see any evidence of active infection locally or systemically at this time which is great news. 02-26-2022 upon evaluation today patient appears to be doing well currently in regard to his wounds. He has been tolerating the dressing changes without complication and everything appears to be completely healed based on what we are  seeing currently. There does not appear to be any evidence of active infection at this time. Objective Constitutional Well-nourished and well-hydrated in no acute distress. Vitals Time Taken: 8:08 AM, Temperature: 98.1 F, Pulse: 59 bpm, Respiratory Rate: 17 breaths/min, Blood Pressure: 130/68 mmHg. Respiratory normal breathing without difficulty. Psychiatric this patient is able to make decisions and demonstrates good insight into disease process. Alert and Oriented x 3. pleasant and cooperative. General Notes: Patient's wounds again are showing signs of complete epithelization there is great skin growth here and there is no open wounds noted which is great news overall I am extremely pleased with where we stand. Integumentary (Hair, Skin) Wound #7 status is Open. Original cause of wound was Skin T ear/Laceration. The date acquired was: 01/17/2022. The wound has been in treatment 4 weeks. The wound is located on the Left,Lateral Lower Leg. The wound measures 0cm length x 0cm width x 0cm depth; 0cm^2 area and 0cm^3 volume. There is Fat Layer (Subcutaneous Tissue) exposed. There is a medium amount  of serosanguineous drainage noted. The wound margin is distinct with the outline attached to the wound base. There is small (1-33%) red granulation within the wound bed. There is a large (67-100%) amount of necrotic tissue within the wound bed including Adherent Slough. 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 Patient to wear own compression stockings every day. - apply every morning and remove every night before bed 1. I am going to suggest that we go ahead and continue with the recommendation for wound care measures as before and the patient is in agreement with the plan. This includes the use of the compression socks which she does have with him today he should be wearing these from first thing in the morning to he goes to bed at night he can take them off obviously to sleep. 2. I would recommend when they take these off at night they put lotion on. We will see the patient back for follow-up visit as needed. Electronic Signature(s) Signed: 02/26/2022 8:37:45 AM By: Worthy Keeler PA-C Entered By: Worthy [name] on 02/26/2022 08:37:45 -------------------------------------------------------------------------------- SuperBill Details Patient Name: Date of Service: [name] [name] 02/26/2022 Medical Record Number: [id number] Patient Account Number: [id number] Date of Birth/Sex: Treating RN: [birth year] (88 y.o. 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 L97.812 Non-pressure chronic ulcer of other part of right lower leg with fat layer exposed I10 Essential (primary) hypertension I50.42 Chronic combined systolic (congestive) and diastolic (congestive) heart failure Facility Procedures CPT4 Code: 63016010 Description: 99213 - WOUND CARE VISIT-LEV 3 EST PT Modifier: Quantity: 1 Physician Procedures : CPT4 Code Description Modifier [id number] - WC PHYS LEVEL 3 - EST PT ICD-10 Diagnosis Description I87.331 Chronic venous hypertension (idiopathic) with ulcer and inflammation of right lower extremity I87.332 Chronic venous hypertension (idiopathic)  with ulcer and inflammation of left lower extremity L97.822 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

## 2022-02-27 NOTE — Progress Notes (Signed)
[name] [name], [name] ([id number]) Visit Report for 02/26/2022 Arrival Information Details Patient Name: Date of Service: [name] [name], [name] [name] 02/26/2022 8:00 A M Medical Record Number: [id number] Patient Account Number: [id number] Date of Birth/Sex: Treating RN: 12-22-1924 (88 y.o. [name] [name], [name] [name] Primary Care Khalie Wince: Alain Honey Other Clinician: Referring Zedric Deroy: Treating Jeaninne Lodico/Extender: [name] in Treatment: 56 Visit Information History Since Last Visit Added or deleted any medications: No Patient Arrived: Wheel Chair Any new allergies or adverse reactions: No Arrival Time: 08:07 Had a fall or experienced change in No Accompanied By: self activities of daily living that may affect Transfer Assistance: None risk of falls: Patient Identification Verified: Yes Signs or symptoms of abuse/neglect since last visito No Secondary Verification Process Completed: Yes Hospitalized since last visit: No Patient Requires Transmission-Based Precautions: No Implantable device outside of the clinic excluding No Patient Has Alerts: Yes cellular tissue based products placed in the center Patient Alerts: ABI's 05/23 R: N/C since last visit: Has Dressing in Place as Prescribed: Yes Has Compression in Place as Prescribed: Yes Pain Present Now: No Electronic Signature(s) Signed: 02/27/2022 4:08:15 PM By: Rhae Hammock RN Entered By: Rhae Hammock on 02/26/2022 08:08:13 -------------------------------------------------------------------------------- Clinic Level of Care Assessment Details Patient Name: Date of Service: [name] [name], [name] [name] 02/26/2022 8:00 A M Medical Record Number: [id number] Patient Account Number: [id number] Date of Birth/Sex: Treating RN: 28-Feb-1925 (88 y.o. [name] [name], [name] [name] Primary Care Marquise Wicke: Alain Honey Other Clinician: Referring Jamie Belger: Treating Channell Quattrone/Extender: [name] in  Treatment: 19 Clinic Level of Care Assessment Items TOOL 4 Quantity Score X- 1 0 Use when only an EandM is performed on FOLLOW-UP visit ASSESSMENTS - Nursing Assessment / Reassessment X- 1 10 Reassessment of Co-morbidities (includes updates in patient status) X- 1 5 Reassessment of Adherence to Treatment Plan ASSESSMENTS - Wound and Skin A ssessment / Reassessment X - Simple Wound Assessment / Reassessment - one wound 1 5 '[]'$  - 0 Complex Wound Assessment / Reassessment - multiple wounds '[]'$  - 0 Dermatologic / Skin Assessment (not related to wound area) ASSESSMENTS - Focused Assessment X- 2 5 Circumferential Edema Measurements - multi extremities '[]'$  - 0 Nutritional Assessment / Counseling / Intervention '[]'$  - 0 Lower Extremity Assessment (monofilament, tuning fork, pulses) '[]'$  - 0 Peripheral Arterial Disease Assessment (using hand held doppler) ASSESSMENTS - Ostomy and/or Continence Assessment and Care '[]'$  - 0 Incontinence Assessment and Management '[]'$  - 0 Ostomy Care Assessment and Management (repouching, etc.) PROCESS - Coordination of Care '[]'$  - 0 Simple Patient / Family Education for ongoing care X- 1 20 Complex (extensive) Patient / Family Education for ongoing care X- 1 10 Staff obtains Programmer, systems, Records, T Results / Process Orders est X- 1 10 Staff telephones HHA, Nursing Homes / Clarify orders / etc '[]'$  - 0 Routine Transfer to another Facility (non-emergent condition) '[]'$  - 0 Routine Hospital Admission (non-emergent condition) '[]'$  - 0 New Admissions / Biomedical engineer / Ordering NPWT Apligraf, etc. , '[]'$  - 0 Emergency Hospital Admission (emergent condition) '[]'$  - 0 Simple Discharge Coordination X- 1 15 Complex (extensive) Discharge Coordination PROCESS - Special Needs '[]'$  - 0 Pediatric / Minor Patient Management '[]'$  - 0 Isolation Patient Management '[]'$  - 0 Hearing / Language / Visual special needs '[]'$  - 0 Assessment of Community assistance (transportation,  D/C planning, etc.) '[]'$  - 0 Additional assistance / Altered mentation '[]'$  - 0 Support Surface(s) Assessment (bed, cushion, seat, etc.) INTERVENTIONS - Wound Cleansing / Measurement X -  Simple Wound Cleansing - one wound 1 5 '[]'$  - 0 Complex Wound Cleansing - multiple wounds X- 1 5 Wound Imaging (photographs - any number of wounds) '[]'$  - 0 Wound Tracing (instead of photographs) X- 1 5 Simple Wound Measurement - one wound '[]'$  - 0 Complex Wound Measurement - multiple wounds INTERVENTIONS - Wound Dressings X - Small Wound Dressing one or multiple wounds 1 10 '[]'$  - 0 Medium Wound Dressing one or multiple wounds '[]'$  - 0 Large Wound Dressing one or multiple wounds '[]'$  - 0 Application of Medications - topical '[]'$  - 0 Application of Medications - injection INTERVENTIONS - Miscellaneous '[]'$  - 0 External ear exam '[]'$  - 0 Specimen Collection (cultures, biopsies, blood, body fluids, etc.) '[]'$  - 0 Specimen(s) / Culture(s) sent or taken to Lab for analysis '[]'$  - 0 Patient Transfer (multiple staff / Civil Service fast streamer / Similar devices) '[]'$  - 0 Simple Staple / Suture removal (25 or less) '[]'$  - 0 Complex Staple / Suture removal (26 or more) '[]'$  - 0 Hypo / Hyperglycemic Management (close monitor of Blood Glucose) '[]'$  - 0 Ankle / Brachial Index (ABI) - do not check if billed separately X- 1 5 Vital Signs Has the patient been seen at the hospital within the last three years: Yes Total Score: 115 Level Of Care: New/Established - Level 3 Electronic Signature(s) Signed: 02/27/2022 4:08:15 PM By: Rhae Hammock RN Entered By: Rhae Hammock on 02/26/2022 08:41:31 -------------------------------------------------------------------------------- Encounter Discharge Information Details Patient Name: Date of Service: [name] [name]. 02/26/2022 8:00 A M Medical Record Number: [id number] Patient Account Number: [id number] Date of Birth/Sex: Treating RN: 25-Feb-1925 (88 y.o. [name] [name], [name] [name] Primary  Care Graciela Plato: Alain Honey Other Clinician: Referring Shiasia Porro: Treating Takoya Jonas/Extender: [name] in Treatment: 19 Encounter Discharge Information Items Discharge Condition: Stable Ambulatory Status: Wheelchair Discharge Destination: Home Transportation: Private Auto Accompanied By: self Schedule Follow-up Appointment: Yes Clinical Summary of Care: Patient Declined Electronic Signature(s) Signed: 02/27/2022 4:08:15 PM By: Rhae Hammock RN Entered By: Rhae Hammock on 02/26/2022 08:42:18 -------------------------------------------------------------------------------- Lower Extremity Assessment Details Patient Name: Date of Service: [name] [name]. 02/26/2022 8:00 A M Medical Record Number: [id number] Patient Account Number: [id number] Date of Birth/Sex: Treating RN: 1925/04/03 (88 y.o. [name] [name], [name] [name] Primary Care Ebb Carelock: Alain Honey Other Clinician: Referring Ardell Aaronson: Treating Nyia Tsao/Extender: Patricia Nettle, Meredith Staggers Weeks in Treatment: 19 Edema Assessment Assessed: Shirlyn Goltz: Yes] Patrice [place]: Yes] Edema: [Left: Yes] [Right: Yes] Calf Left: Right: Point of Measurement: 34 cm From Medial Instep 38 cm 38.7 cm Ankle Left: Right: Point of Measurement: 9 cm From Medial Instep 24.3 cm 23 cm Vascular Assessment Pulses: Dorsalis Pedis Palpable: [Left:Yes] [Right:Yes] Posterior Tibial Palpable: [Left:Yes] [Right:Yes] Electronic Signature(s) Signed: 02/27/2022 4:08:15 PM By: Rhae Hammock RN Entered By: Rhae Hammock on 02/26/2022 08:09:28 -------------------------------------------------------------------------------- [place] Details Patient Name: Date of Service: [name] [name]. 02/26/2022 8:00 A M Medical Record Number: [id number] Patient Account Number: [id number] Date of Birth/Sex: Treating RN: 1924/12/04 (88 y.o. [name] [name], [name] [name] Primary Care Erynn Vaca: Alain Honey Other Clinician: Referring Thierno Hun: Treating Marithza Malachi/Extender: Patricia Nettle, Ihor Gully in Treatment: 19 Active Inactive Electronic Signature(s) Signed: 02/27/2022 4:08:15 PM By: Rhae Hammock RN Entered By: Rhae Hammock on 02/26/2022 08:40:22 -------------------------------------------------------------------------------- Pain Assessment Details Patient Name: Date of Service: [name] [name]. 02/26/2022 8:00 A M Medical Record Number: [id number] Patient Account Number: [id number] Date of Birth/Sex: Treating RN: 1924/07/24 (88 y.o. [name] [name], [place] Primary Care Bobbijo Holst: Sabra Heck ,  Annie Main Other Clinician: Referring Disa Riedlinger: Treating Khamron Gellert/Extender: [name] in Treatment: 19 Active Problems Location of Pain Severity and Description of Pain Patient Has Paino No Site Locations Pain Management and Medication Current Pain Management: Electronic Signature(s) Signed: 02/27/2022 4:08:15 PM By: Rhae Hammock RN Entered By: Rhae Hammock on 02/26/2022 08:09:16 -------------------------------------------------------------------------------- Patient/Caregiver Education Details Patient Name: Date of Service: [name] [name] 9/13/2023andnbsp8:00 [place] Record Number: [id number] Patient Account Number: [id number] Date of Birth/Gender: Treating RN: [birth year] (88 y.o. E[place] Noe Primary Care Physician: Alain Honey Other Clinician: Referring Physician: Treating Physician/Extender: [name] in Treatment: 61 Education Assessment Education Provided To: Patient Education Topics Provided Wound/Skin Impairment: Methods: Explain/Verbal Responses: Reinforcements needed, State content correctly Electronic Signature(s) Signed: 02/27/2022 4:08:15 PM By: Rhae Hammock RN Entered By: Rhae Hammock on 02/26/2022  08:40:38 -------------------------------------------------------------------------------- Wound Assessment Details Patient Name: Date of Service: [name] [name]. 02/26/2022 8:00 A M Medical Record Number: [id number] Patient Account Number: [id number] Date of Birth/Sex: Treating RN: [birth year] (88 y.o. [name] [name], [name] [name] Primary Care Lorenza Winkleman: Alain Honey Other Clinician: Referring Shaunda Tipping: Treating Ricky Doan/Extender: [name] in Treatment: 19 Wound Status Wound Number: 7 Primary Venous Leg Ulcer Etiology: Wound Location: Left, Lateral Lower Leg Wound Open Wounding Event: Skin Tear/Laceration Status: Date Acquired: 01/17/2022 Comorbid Arrhythmia, Congestive Heart Failure, Coronary Artery Disease, Weeks Of Treatment: 4 History: Hypertension, Peripheral Venous Disease, Osteoarthritis, Seizure Clustered Wound: No Disorder Photos Wound Measurements Length: (cm) Width: (cm) Depth: (cm) Area: (cm) Volume: (cm) 0 % Reduction in Area: 100% 0 % Reduction in Volume: 100% 0 Epithelialization: None 0 0 Wound Description Classification: Full Thickness Without Exposed Support Structures Wound Margin: Distinct, outline attached Exudate Amount: Medium Exudate Type: Serosanguineous Exudate Color: red, brown Foul Odor After Cleansing: No Slough/Fibrino No Wound Bed Granulation Amount: Small (1-33%) Exposed Structure Granulation Quality: Red Fascia Exposed: No Necrotic Amount: Large (67-100%) Fat Layer (Subcutaneous Tissue) Exposed: Yes Necrotic Quality: Adherent Slough Tendon Exposed: No Muscle Exposed: No Joint Exposed: No Bone Exposed: No Electronic Signature(s) Signed: 02/27/2022 4:08:15 PM By: Rhae Hammock RN Entered By: Rhae Hammock on 02/26/2022 08:15:42 -------------------------------------------------------------------------------- Vitals Details Patient Name: Date of Service: [name] [name]. 02/26/2022 8:00 A  M Medical Record Number: [id number] Patient Account Number: [id number] Date of Birth/Sex: Treating RN: December 11, 1924 (88 y.o. [name] [name], [name] [name] Primary Care Audryna Wendt: Alain Honey Other Clinician: Referring Buck Mcaffee: Treating Toneisha Savary/Extender: Patricia Nettle, Ihor Gully in Treatment: 19 Vital Signs Time Taken: 08:08 Temperature (F): 98.1 Pulse (bpm): 59 Respiratory Rate (breaths/min): 17 Blood Pressure (mmHg): 130/68 Reference Range: 80 - 120 mg / dl Electronic Signature(s) Signed: 02/27/2022 4:08:15 PM By: Rhae Hammock RN Entered By: Rhae Hammock on 02/26/2022 08:09:10

## 2022-03-10 ENCOUNTER — Non-Acute Institutional Stay (SKILLED_NURSING_FACILITY): Payer: Medicare HMO | Admitting: Nurse Practitioner

## 2022-03-10 DIAGNOSIS — D329 Benign neoplasm of meninges, unspecified: Secondary | ICD-10-CM

## 2022-03-10 DIAGNOSIS — I5032 Chronic diastolic (congestive) heart failure: Secondary | ICD-10-CM | POA: Diagnosis not present

## 2022-03-10 DIAGNOSIS — K219 Gastro-esophageal reflux disease without esophagitis: Secondary | ICD-10-CM

## 2022-03-10 DIAGNOSIS — I4891 Unspecified atrial fibrillation: Secondary | ICD-10-CM | POA: Diagnosis not present

## 2022-03-10 DIAGNOSIS — M159 Polyosteoarthritis, unspecified: Secondary | ICD-10-CM | POA: Diagnosis not present

## 2022-03-10 DIAGNOSIS — F5101 Primary insomnia: Secondary | ICD-10-CM

## 2022-03-10 DIAGNOSIS — R296 Repeated falls: Secondary | ICD-10-CM | POA: Diagnosis not present

## 2022-03-10 DIAGNOSIS — I872 Venous insufficiency (chronic) (peripheral): Secondary | ICD-10-CM

## 2022-03-10 DIAGNOSIS — I2510 Atherosclerotic heart disease of native coronary artery without angina pectoris: Secondary | ICD-10-CM

## 2022-03-10 DIAGNOSIS — Z8673 Personal history of transient ischemic attack (TIA), and cerebral infarction without residual deficits: Secondary | ICD-10-CM | POA: Diagnosis not present

## 2022-03-10 DIAGNOSIS — N401 Enlarged prostate with lower urinary tract symptoms: Secondary | ICD-10-CM

## 2022-03-10 DIAGNOSIS — I10 Essential (primary) hypertension: Secondary | ICD-10-CM

## 2022-03-10 DIAGNOSIS — R569 Unspecified convulsions: Secondary | ICD-10-CM

## 2022-03-10 DIAGNOSIS — N138 Other obstructive and reflux uropathy: Secondary | ICD-10-CM

## 2022-03-10 DIAGNOSIS — R69 Illness, unspecified: Secondary | ICD-10-CM | POA: Diagnosis not present

## 2022-03-10 NOTE — Assessment & Plan Note (Signed)
Stable, takes Omeprazole. Hgb 12.5 12/19/21

## 2022-03-10 NOTE — Assessment & Plan Note (Signed)
Hx of CAD, hyperlipidemia, on Crestor, ASA. LDL 61 07/18/21.  10/09/20 EKG SR vent rate 65, no significant ST-T changes.  

## 2022-03-10 NOTE — Assessment & Plan Note (Signed)
resolved right hand numbness, weakness, difficulty feeding self, changed to ASA 325mg qd from 81mg 11/13/20, on Statin, declined neurology consultation 

## 2022-03-10 NOTE — Assessment & Plan Note (Signed)
Chronic lower back pain/right hip pain,  on Gabapentin              OA, s/p left hip surgery, chronic right hip pain, w/c helps, prn Tylenol 

## 2022-03-10 NOTE — Assessment & Plan Note (Signed)
Stable,  takes Keppra, F/u neurology

## 2022-03-10 NOTE — Assessment & Plan Note (Addendum)
Chronic venous ulcers of BLE, Xeroform, Kerlix, f/u wound care center, treated for infected wounds/cellulitis in the past.  BLE brownish erythematous pigmentation, swelling, venous stasis ulcers.

## 2022-03-10 NOTE — Assessment & Plan Note (Signed)
onset 09/2019, heart rate is controlled, on ASA 

## 2022-03-10 NOTE — Assessment & Plan Note (Signed)
stable, off Trazodone.

## 2022-03-10 NOTE — Assessment & Plan Note (Signed)
blood pressure is controled on amlodipine, Lisinopril. Bun/creat 22/1.1 11/21/21 

## 2022-03-10 NOTE — Progress Notes (Unsigned)
Location:   SNF [facility]  Place of Service:  SNF (31)  Provider: [facility] Shalece Staffa NP  Wardell Honour, MD  Patient Care Team: Wardell Honour, MD as PCP - General (Family Medicine) Gatha Mayer, MD as Consulting Physician (Gastroenterology) [name], [name], MD as Consulting Physician (Cardiology) [name], MD as Attending Physician (Internal Medicine) Kindsey Eblin X, NP as Nurse Practitioner (Internal Medicine)  Extended Emergency Contact Information Primary Emergency Contact: Maravilla,[name] Address: [place], [place] [address] [name] of [place] Phone: [phone] Mobile Phone: [phone] Relation: Daughter Secondary Emergency Contact: Tietje,[name] Address: [place] [address] [name] of [place] Phone: [phone] Mobile Phone: [phone] Relation: Son  Code Status:  DNR Goals of care: Advanced Directive information    02/11/2022   11:46 AM  Advanced Directives  Does Patient Have a Medical Advance Directive? Yes  Type of Advance Directive Living will;Out of facility DNR (pink MOST or yellow form)  Does patient want to make changes to medical advance directive? No - Patient declined  Pre-existing out of facility DNR order (yellow form or pink MOST form) Yellow form placed in chart (order not valid for inpatient use)     Chief Complaint  Patient presents with   Medical Management of Chronic Issues    HPI:  Pt is a 88 y.o. male seen today for medical management of chronic diseases.    Chronic venous ulcers of BLE, Xeroform, Kerlix, f/u wound care center, treated for infected wounds/cellulitis in the past.   TIA, resolved right hand numbness, weakness, difficulty feeding self, changed to ASA '325mg'$  qd from '81mg'$  11/13/20, on Statin, declined neurology consultation.              Recurrent falls, due to unsteady on his legs, attempting standing up or transfer with no assistance.               BPH, saw urology. Chronic cystitis              PVD, BLE brownish erythematous pigmentation, swelling, venous stasis ulcers.              Afib, onset 09/2019, heart rate is controlled, on ASA             CHF, EF 10-93%, grade 1 diastolic dysfunction, on Furosemide, saw Cardiology             HTN, blood pressure is controled on amlodipine, Lisinopril. Bun/creat 22/1.1 11/21/21             Hx of CAD, hyperlipidemia, on Crestor, ASA. LDL 61 07/18/21.  10/09/20 EKG SR vent rate 65, no significant ST-T changes.              GERD, takes Omeprazole. Hgb 12.5 12/19/21             Insomnia, stable, off Trazodone.              Chronic lower back pain/right hip pain,  on Gabapentin              OA, s/p left hip surgery, chronic right hip pain, w/c helps, prn Tylenol             Seizure: takes Keppra, F/u neurology       Past Medical History:  Diagnosis Date   Allergic rhinitis    Amnesia  Atherosclerotic heart disease of native coronary artery without angina pectoris    BPH (benign prostatic hyperplasia)    Bradycardia    Chronic diastolic (congestive) heart failure (HCC)    Colon polyps    Coronary artery disease    MODERATE   Dizziness    Edema of lower extremity    Erectile dysfunction    H. pylori infection    Hx of    Hard of hearing    Headache(784.0)    History of colon polyps 2009   Dr. Orr/Colonoscopy -small cecal adenoma   History of falling    Hx of colonoscopy 2009   Dr Lajoyce Corners, hemorrhoids & polyps   Hyperlipidemia    Hypertension    Hypertensive heart disease with heart failure ([place])    Hypokalemia    Internal hemorrhoids 2009   Dr. Lajoyce Corners Colonoscopy   Metabolic encephalopathy    Mitral valve regurgitation    Muscle weakness (generalized)    Pneumonia    Primary generalized (osteo)arthritis    Reflux esophagitis    Sepsis ([place]) 11/04/2018   Unspecified hemorrhoids    Unsteadiness on feet    Vitamin D deficiency    Past Surgical History:  Procedure Laterality Date    APPENDECTOMY     CARDIAC CATHETERIZATION  1997   REVEALED MILD TO MODERATE IRREGULARITIES. HIS LEFT EVNTRICULAR SYSTOLIC FUNCTION REVEALED NORMAL EJECTION FRACTION WITH EF OF 70%   CATARACT EXTRACTION, BILATERAL     COLONOSCOPY  multiple   fatty tumor removal     benign   HEMORROIDECTOMY     HIP ARTHROPLASTY Left 08/17/2013   Procedure: ARTHROPLASTY BIPOLAR HIP;  Surgeon: Gearlean Alf, MD;  Location: [facility];  Service: Orthopedics;  Laterality: Left;   INGUINAL HERNIA REPAIR Bilateral 2006   lower back surgery  2006   NASAL SEPTUM SURGERY     TONSILLECTOMY     UPPER GASTROINTESTINAL ENDOSCOPY      No Known Allergies  Allergies as of 03/10/2022   No Known Allergies      Medication List        Accurate as of March 10, 2022 11:59 PM. If you have any questions, ask your nurse or doctor.          acetaminophen 500 MG tablet Commonly known as: TYLENOL Take 500 mg by mouth every 8 (eight) hours as needed.   amLODipine 5 MG tablet Commonly known as: NORVASC Take 5 mg by mouth daily.   aspirin 325 MG tablet Take 325 mg by mouth daily.   CeraVe Crea Apply topically at bedtime. To lower extremities @ bedtime   cyanocobalamin 1000 MCG tablet Commonly known as: VITAMIN B12 Take 1,000 mcg by mouth daily.   DEXTRAN 70-HYPROMELLOSE OP Apply 1 drop to eye 3 (three) times daily as needed. Special Instructions: as needed for dry eyes/itching   hydrocortisone 2.5 % rectal cream Commonly known as: ANUSOL-HC Place 1 application rectally 2 (two) times daily as needed for anal itching or hemorrhoids (USE PERINEAL APPLICATOR). With perineal applicator   levETIRAcetam 500 MG tablet Commonly known as: KEPPRA Take 500 mg by mouth 2 (two) times daily.   lisinopril 40 MG tablet Commonly known as: ZESTRIL Take 40 mg by mouth daily.   omeprazole 20 MG capsule Commonly known as: PRILOSEC Take 20 mg by mouth daily.   potassium chloride SA 20 MEQ tablet Commonly known as:  KLOR-CON M Take 20 mEq by mouth daily.   rosuvastatin 5 MG tablet Commonly known as: CRESTOR  Take 5 mg by mouth daily.   Thera-M Tabs Take 1 tablet by mouth daily.   torsemide 20 MG tablet Commonly known as: DEMADEX Take 20 mg by mouth daily.        Review of Systems  Constitutional:  Negative for fatigue, fever and unexpected weight change.  HENT:  Positive for hearing loss. Negative for congestion and voice change.   Eyes:  Negative for visual disturbance.  Respiratory:  Negative for cough, shortness of breath and wheezing.   Cardiovascular:  Positive for leg swelling.  Gastrointestinal:  Negative for abdominal pain and constipation.  Genitourinary:  Positive for frequency. Negative for difficulty urinating and urgency.  Musculoskeletal:  Positive for arthralgias, back pain and gait problem.       Lower back, right hip/thigh pain   Skin:  Negative for color change.       Chronic venous insufficiency skin changes BLE, venous ulcers BLE covered in dressing  Neurological:  Negative for seizures, weakness and headaches.       Memory lapses. Tingling in toes. Right hand weakness/numbness is improved, placed ASA 325 11/13/20  Psychiatric/Behavioral:  Negative for behavioral problems and sleep disturbance. The patient is not nervous/anxious.     Immunization History  Administered Date(s) Administered   Influenza, High Dose Seasonal PF 03/19/2019, 03/19/2019   Influenza-Unspecified 04/08/2016, 03/27/2020, 04/03/2021   Moderna Sars-Covid-2 Vaccination 06/18/2019, 07/16/2019, 04/24/2020, 11/13/2020   Pfizer Covid-19 Vaccine Bivalent Booster 33yr & up 03/05/2021   Pneumococcal Conjugate-13 06/02/2013   Pneumococcal Polysaccharide-23 04/07/2001   Tetanus 08/14/2013   Zoster Recombinat (Shingrix) 08/03/2021   Pertinent  Health Maintenance Due  Topic Date Due   INFLUENZA VACCINE  01/14/2022      09/21/2019    8:15 PM 09/22/2019    7:30 AM 09/22/2019    9:00 PM 09/23/2019    9:30 AM  01/12/2020    3:02 AM  Fall Risk  Patient Fall Risk Level High fall risk High fall risk High fall risk High fall risk High fall risk   Functional Status Survey:    Vitals:   03/10/22 1414  BP: (!) 161/79  Pulse: 70  Resp: 20  Temp: 98 F (36.7 C)  SpO2: 94%   There is no height or weight on file to calculate BMI. Physical Exam Vitals and nursing note reviewed.  Constitutional:      Appearance: Normal appearance.  HENT:     Head: Normocephalic and atraumatic.     Mouth/Throat:     Mouth: Mucous membranes are moist.  Eyes:     Conjunctiva/sclera: Conjunctivae normal.     Pupils: Pupils are equal, round, and reactive to light.  Cardiovascular:     Rate and Rhythm: Normal rate and regular rhythm.     Heart sounds: Murmur heard.  Pulmonary:     Breath sounds: No rales.  Abdominal:     General: Bowel sounds are normal.     Palpations: Abdomen is soft.     Tenderness: There is no abdominal tenderness.  Genitourinary:    Comments: External hemorrhoids, no bleeding or injury.  Musculoskeletal:     Cervical back: Normal range of motion and neck supple. No muscular tenderness.     Right lower leg: Edema present.     Left lower leg: Edema present.     Comments: 1-2+ edema BLE. W/c for mobility.   Skin:    General: Skin is warm and dry.     Comments: Chronic venous insufficiency skin changes BLE, venous stasis  ulcers BLE covered in dressing.   Neurological:     General: No focal deficit present.     Mental Status: He is alert. Mental status is at baseline.     Motor: No weakness.     Coordination: Coordination normal.     Gait: Gait abnormal.     Comments: Oriented to person, place.   Psychiatric:        Mood and Affect: Mood normal.        Behavior: Behavior normal.        Thought Content: Thought content normal.     Labs reviewed: Recent Labs    07/18/21 0000 10/15/21 0000 11/21/21 0000  NA 141 143 143  K 4.3 5.1 4.1  CL 102 106 108  CO2 31* 24* 30*  BUN 16  35* 22*  CREATININE 1.0 1.3 1.1  CALCIUM 9.3 9.6 8.7   Recent Labs    07/18/21 0000  AST 19  ALT 17  ALKPHOS 61  ALBUMIN 3.9   Recent Labs    07/18/21 0000 12/19/21 0000  WBC 7.4 8.2  NEUTROABS  --  4,674.00  HGB 14.1 12.5*  HCT 41 36*  PLT 223 218   Lab Results  Component Value Date   TSH 1.77 04/06/2020   No results found for: "HGBA1C" Lab Results  Component Value Date   CHOL 131 07/18/2021   HDL 57 07/18/2021   LDLCALC 61 07/18/2021   TRIG 57 07/18/2021   CHOLHDL 2.7 02/17/2017    Significant Diagnostic Results in last 30 days:  No results found.  Assessment/Plan  New onset atrial fibrillation ([place]) onset 09/2019, heart rate is controlled, on ASA  Chronic diastolic heart failure (HCC) EF 85-46%, grade 1 diastolic dysfunction, on Furosemide, saw Cardiology  Hypertension SBP elevated intermittently, on amlodipine, Lisinopril. Bun/creat 22/1.1 11/21/21  CAD (coronary artery disease) Hx of CAD, hyperlipidemia, on Crestor, ASA. LDL 61 07/18/21.  10/09/20 EKG SR vent rate 65, no significant ST-T changes.   GERD (gastroesophageal reflux disease) Stable, takes Omeprazole. Hgb 12.5 12/19/21  Insomnia stable, off Trazodone.   Osteoarthritis, multiple sites Chronic lower back pain/right hip pain,  on Gabapentin              OA, s/p left hip surgery, chronic right hip pain, w/c helps, prn Tylenol  Seizure (HCC) Stable,  takes Keppra, F/u neurology     Venous insufficiency (chronic) (peripheral) Chronic venous ulcers of BLE, Xeroform, Kerlix, f/u wound care center, treated for infected wounds/cellulitis in the past.  BLE brownish erythematous pigmentation, swelling, venous stasis ulcers.   BPH (benign prostatic hyperplasia) saw urology. Chronic cystitis  Recurrent falls due to unsteady on his legs, attempting standing up or transfer with no assistance.   History of TIA (transient ischemic attack) resolved right hand numbness, weakness, difficulty feeding  self, changed to ASA '325mg'$  qd from '81mg'$  11/13/20, on Statin, declined neurology consultation.    Family/ staff Communication: plan of care reviewed with the patient and charge nurse  Labs/tests ordered:  none   Time spend 35 minutes

## 2022-03-10 NOTE — Assessment & Plan Note (Signed)
due to unsteady on his legs, attempting standing up or transfer with no assistance.

## 2022-03-10 NOTE — Assessment & Plan Note (Signed)
EF 34-03%, grade 1 diastolic dysfunction, on Furosemide, saw Cardiology

## 2022-03-10 NOTE — Assessment & Plan Note (Signed)
saw urology. Chronic cystitis 

## 2022-03-11 ENCOUNTER — Encounter: Payer: Self-pay | Admitting: Nurse Practitioner

## 2022-03-11 DIAGNOSIS — L84 Corns and callosities: Secondary | ICD-10-CM | POA: Diagnosis not present

## 2022-03-11 DIAGNOSIS — M79672 Pain in left foot: Secondary | ICD-10-CM | POA: Diagnosis not present

## 2022-03-11 DIAGNOSIS — M79671 Pain in right foot: Secondary | ICD-10-CM | POA: Diagnosis not present

## 2022-03-11 DIAGNOSIS — B351 Tinea unguium: Secondary | ICD-10-CM | POA: Diagnosis not present

## 2022-04-04 ENCOUNTER — Encounter: Payer: Self-pay | Admitting: Nurse Practitioner

## 2022-04-04 ENCOUNTER — Non-Acute Institutional Stay (SKILLED_NURSING_FACILITY): Payer: Medicare HMO | Admitting: Nurse Practitioner

## 2022-04-04 DIAGNOSIS — K219 Gastro-esophageal reflux disease without esophagitis: Secondary | ICD-10-CM

## 2022-04-04 DIAGNOSIS — I872 Venous insufficiency (chronic) (peripheral): Secondary | ICD-10-CM

## 2022-04-04 DIAGNOSIS — R296 Repeated falls: Secondary | ICD-10-CM

## 2022-04-04 DIAGNOSIS — I5032 Chronic diastolic (congestive) heart failure: Secondary | ICD-10-CM

## 2022-04-04 DIAGNOSIS — M159 Polyosteoarthritis, unspecified: Secondary | ICD-10-CM

## 2022-04-04 DIAGNOSIS — F5101 Primary insomnia: Secondary | ICD-10-CM

## 2022-04-04 DIAGNOSIS — N401 Enlarged prostate with lower urinary tract symptoms: Secondary | ICD-10-CM

## 2022-04-04 DIAGNOSIS — R569 Unspecified convulsions: Secondary | ICD-10-CM

## 2022-04-04 DIAGNOSIS — Z8673 Personal history of transient ischemic attack (TIA), and cerebral infarction without residual deficits: Secondary | ICD-10-CM | POA: Diagnosis not present

## 2022-04-04 DIAGNOSIS — I10 Essential (primary) hypertension: Secondary | ICD-10-CM

## 2022-04-04 DIAGNOSIS — N138 Other obstructive and reflux uropathy: Secondary | ICD-10-CM

## 2022-04-04 DIAGNOSIS — I482 Chronic atrial fibrillation, unspecified: Secondary | ICD-10-CM

## 2022-04-04 DIAGNOSIS — I2510 Atherosclerotic heart disease of native coronary artery without angina pectoris: Secondary | ICD-10-CM

## 2022-04-04 NOTE — Assessment & Plan Note (Signed)
Chronic venous ulcers of BLE, Xeroform, Kerlix, f/u wound care center, treated for infected wounds/cellulitis in the past.

## 2022-04-04 NOTE — Assessment & Plan Note (Signed)
saw urology. Chronic cystitis 

## 2022-04-04 NOTE — Assessment & Plan Note (Signed)
stable, off Trazodone.

## 2022-04-04 NOTE — Progress Notes (Signed)
Location:  [facility] Room Number: 61/A Place of Service:  SNF (31) Provider: Eliyah Bazzi X , NP  Wardell Honour, MD  Patient Care Team: Wardell Honour, MD as PCP - General (Family Medicine) Gatha Mayer, MD as Consulting Physician (Gastroenterology) [name], [name], MD as Consulting Physician (Cardiology) [name], MD as Attending Physician (Internal Medicine) Dellia Donnelly X, NP as Nurse Practitioner (Internal Medicine)  Extended Emergency Contact Information Primary Emergency Contact: Stanwood,[name] Address: [place], [place] [address] [name] of [place] Phone: [phone] Mobile Phone: [phone] Relation: Daughter Secondary Emergency Contact: Elrod,[name] Address: [place] [address] [name] of [place] Phone: [phone] Mobile Phone: [phone] Relation: Son  Code Status:  DNR Goals of care: Advanced Directive information    04/04/2022    9:24 AM  Advanced Directives  Does Patient Have a Medical Advance Directive? Yes  Type of Paramedic of [place];Out of facility DNR (pink MOST or yellow form)  Does patient want to make changes to medical advance directive? No - Patient declined     Chief Complaint  Patient presents with  . Medical Management of Chronic Issues    Patient routine Visit  . Immunizations    Discussed the need for Covid, flu and shingles vaccine    HPI:  Pt is a 88 y.o. male seen today for medical management of chronic diseases.     Chronic venous ulcers of BLE, Xeroform, Kerlix, f/u wound care center, treated for infected wounds/cellulitis in the past.              TIA, resolved right hand numbness, weakness, difficulty feeding self, changed to ASA '325mg'$  qd from '81mg'$  11/13/20, on Statin, declined neurology consultation.              Recurrent falls, due to unsteady on his legs, attempting standing  up or transfer with no assistance.              BPH, saw urology. Chronic cystitis              PVD, BLE brownish erythematous pigmentation, swelling, venous stasis ulcers.              Afib, onset 09/2019, heart rate is controlled, on ASA             CHF, EF 38-18%, grade 1 diastolic dysfunction, on Furosemide, saw Cardiology             HTN, blood pressure is controled on amlodipine, Lisinopril. Bun/creat 22/1.1 11/21/21             Hx of CAD, hyperlipidemia, on Crestor, ASA. LDL 61 07/18/21.  10/09/20 EKG SR vent rate 65, no significant ST-T changes.              GERD, takes Omeprazole. Hgb 12.5 12/19/21             Insomnia, stable, off Trazodone.              Chronic lower back pain/right hip pain,  on Gabapentin              OA, s/p left hip surgery, chronic right hip pain, w/c helps, prn Tylenol             Seizure: takes Keppra, F/u neurology    Past Medical History:  Diagnosis Date  . Allergic rhinitis   . Amnesia   . Atherosclerotic heart disease of native coronary artery without angina pectoris   . BPH (benign prostatic hyperplasia)   . Bradycardia   . Chronic diastolic (congestive) heart failure ([facility])   . Colon polyps   . Coronary artery disease    MODERATE  . Dizziness   . Edema of lower extremity   . Erectile dysfunction   . H. pylori infection    Hx of   . Hard of hearing   . Headache(784.0)   . History of colon polyps 2009   Dr. Orr/Colonoscopy -small cecal adenoma  . History of falling   . Hx of colonoscopy 2009   Dr Lajoyce Corners, hemorrhoids & polyps  . Hyperlipidemia   . Hypertension   . Hypertensive heart disease with heart failure ([place])   . Hypokalemia   . Internal hemorrhoids 2009   Dr. Lajoyce Corners Colonoscopy  . Metabolic encephalopathy   . Mitral valve regurgitation   . Muscle weakness (generalized)   . Pneumonia   . Primary generalized (osteo)arthritis   . Reflux esophagitis   . Sepsis ([place] [facility]) 11/04/2018  . Unspecified hemorrhoids   . Unsteadiness on feet   . Vitamin  D deficiency    Past Surgical History:  Procedure Laterality Date  . APPENDECTOMY    . CARDIAC CATHETERIZATION  1997   REVEALED MILD TO MODERATE IRREGULARITIES. HIS LEFT EVNTRICULAR SYSTOLIC FUNCTION REVEALED NORMAL EJECTION FRACTION WITH EF OF 70%  . CATARACT EXTRACTION, BILATERAL    . COLONOSCOPY  multiple  . fatty tumor removal     benign  . HEMORROIDECTOMY    . HIP ARTHROPLASTY Left 08/17/2013   Procedure: ARTHROPLASTY BIPOLAR HIP;  Surgeon: Gearlean Alf, MD;  Location: [facility];  Service: Orthopedics;  Laterality: Left;  . INGUINAL HERNIA REPAIR Bilateral 2006  . lower back surgery  2006  . NASAL SEPTUM SURGERY    . TONSILLECTOMY    . UPPER GASTROINTESTINAL ENDOSCOPY      No Known Allergies  Outpatient Encounter Medications as of 04/04/2022  Medication Sig  . acetaminophen (TYLENOL) 500 MG tablet Take 500 mg by mouth every 8 (eight) hours as needed.  Marland Kitchen amLODipine (NORVASC) 5 MG tablet Take 5 mg by mouth daily.  Marland Kitchen aspirin 325 MG tablet Take 325 mg by mouth daily.  Marland Kitchen DEXTRAN 70-HYPROMELLOSE OP Apply 1 drop to eye 3 (three) times daily as needed. Special Instructions: as needed for dry eyes/itching  . Emollient (CERAVE) CREA Apply topically at bedtime. To lower extremities @ bedtime  . hydrocortisone (ANUSOL-HC) 2.5 % rectal cream Place 1 application rectally 2 (two) times daily as needed for anal itching or hemorrhoids (USE PERINEAL APPLICATOR). With perineal applicator  . levETIRAcetam (KEPPRA) 500 MG tablet Take 500 mg by mouth 2 (two) times daily.  Marland Kitchen lisinopril (ZESTRIL) 40 MG tablet Take 40 mg by mouth daily.  . Multiple Vitamins-Minerals (THERA-M) TABS Take 1 tablet by mouth daily.  Marland Kitchen omeprazole (PRILOSEC) 20 MG capsule Take 20 mg by mouth daily.  . potassium chloride SA (KLOR-CON) 20 MEQ tablet Take 20 mEq by mouth daily.  . rosuvastatin (CRESTOR) 5 MG tablet Take 5 mg by mouth daily.   Marland Kitchen torsemide (DEMADEX) 20 MG tablet Take 20 mg by mouth daily.  . vitamin B-12  (CYANOCOBALAMIN) 1000 MCG tablet Take 1,000 mcg by mouth daily.   No facility-administered encounter medications on file as of 04/04/2022.    Review of Systems  Constitutional:  Negative  for fatigue, fever and unexpected weight change.  HENT:  Positive for hearing loss. Negative for congestion and voice change.   Eyes:  Negative for visual disturbance.  Respiratory:  Negative for cough, shortness of breath and wheezing.   Cardiovascular:  Positive for leg swelling.  Gastrointestinal:  Negative for abdominal pain and constipation.  Genitourinary:  Positive for frequency. Negative for difficulty urinating and urgency.  Musculoskeletal:  Positive for arthralgias, back pain and gait problem.       Lower back, right hip/thigh pain   Skin:  Negative for color change.       Chronic venous insufficiency skin changes BLE, venous ulcers BLE covered in dressing  Neurological:  Negative for seizures, weakness and headaches.       Memory lapses. Tingling in toes. Right hand weakness/numbness is improved, placed ASA 325 11/13/20  Psychiatric/Behavioral:  Negative for behavioral problems and sleep disturbance. The patient is not nervous/anxious.     Immunization History  Administered Date(s) Administered  . Influenza, High Dose Seasonal PF 03/19/2019, 03/19/2019  . Influenza-Unspecified 04/08/2016, 03/27/2020, 04/03/2021  . Moderna Sars-Covid-2 Vaccination 06/18/2019, 07/16/2019, 04/24/2020, 11/13/2020  . Pension scheme manager 53yr & up 03/05/2021  . Pneumococcal Conjugate-13 06/02/2013  . Pneumococcal Polysaccharide-23 04/07/2001  . Tetanus 08/14/2013  . Zoster Recombinat (Shingrix) 08/03/2021   Pertinent  Health Maintenance Due  Topic Date Due  . INFLUENZA VACCINE  01/14/2022      09/22/2019    7:30 AM 09/22/2019    9:00 PM 09/23/2019    9:30 AM 01/12/2020    3:02 AM 04/04/2022    9:23 AM  Fall Risk  Falls in the past year?     0  Was there an injury with Fall?     0   Fall Risk Category Calculator     0  Fall Risk Category     Low  Patient Fall Risk Level High fall risk High fall risk High fall risk High fall risk Low fall risk  Patient at Risk for Falls Due to     History of fall(s);Impaired balance/gait  Fall risk Follow up     Falls evaluation completed   Functional Status Survey:    Vitals:   04/04/22 0918  BP: (!) 153/84  Pulse: 70  Resp: 20  Temp: 97.7 F (36.5 C)  SpO2: 98%  Weight: 178 lb (80.7 kg)  Height: '5\' 7"'$  (1.702 m)   Body mass index is 27.88 kg/m. Physical Exam Vitals and nursing note reviewed.  Constitutional:      Appearance: Normal appearance.  HENT:     Head: Normocephalic and atraumatic.     Mouth/Throat:     Mouth: Mucous membranes are moist.  Eyes:     Conjunctiva/sclera: Conjunctivae normal.     Pupils: Pupils are equal, round, and reactive to light.  Cardiovascular:     Rate and Rhythm: Normal rate and regular rhythm.     Heart sounds: Murmur heard.  Pulmonary:     Breath sounds: No rales.  Abdominal:     General: Bowel sounds are normal.     Palpations: Abdomen is soft.     Tenderness: There is no abdominal tenderness.  Genitourinary:    Comments: External hemorrhoids, no bleeding or injury.  Musculoskeletal:     Cervical back: Normal range of motion and neck supple. No muscular tenderness.     Right lower leg: Edema present.     Left lower leg: Edema present.     Comments:  1-2+ edema BLE. W/c for mobility.   Skin:    General: Skin is warm and dry.     Comments: Chronic venous insufficiency skin changes BLE, venous stasis ulcers BLE covered in dressing.   Neurological:     General: No focal deficit present.     Mental Status: He is alert. Mental status is at baseline.     Motor: No weakness.     Coordination: Coordination normal.     Gait: Gait abnormal.     Comments: Oriented to person, place.   Psychiatric:        Mood and Affect: Mood normal.        Behavior: Behavior normal.         Thought Content: Thought content normal.    Labs reviewed: Recent Labs    07/18/21 0000 10/15/21 0000 11/21/21 0000  NA 141 143 143  K 4.3 5.1 4.1  CL 102 106 108  CO2 31* 24* 30*  BUN 16 35* 22*  CREATININE 1.0 1.3 1.1  CALCIUM 9.3 9.6 8.7   Recent Labs    07/18/21 0000  AST 19  ALT 17  ALKPHOS 61  ALBUMIN 3.9   Recent Labs    07/18/21 0000 12/19/21 0000  WBC 7.4 8.2  NEUTROABS  --  4,674.00  HGB 14.1 12.5*  HCT 41 36*  PLT 223 218   Lab Results  Component Value Date   TSH 1.77 04/06/2020   No results found for: "HGBA1C" Lab Results  Component Value Date   CHOL 131 07/18/2021   HDL 57 07/18/2021   LDLCALC 61 07/18/2021   TRIG 57 07/18/2021   CHOLHDL 2.7 02/17/2017    Significant Diagnostic Results in last 30 days:  No results found.  Assessment/Plan There are no diagnoses linked to this encounter.   Family/ staff Communication: ***  Labs/tests ordered:  ***

## 2022-04-04 NOTE — Assessment & Plan Note (Signed)
blood pressure is controled on amlodipine, Lisinopril. Bun/creat 22/1.1 11/21/21 

## 2022-04-04 NOTE — Assessment & Plan Note (Signed)
EF 34-03%, grade 1 diastolic dysfunction, on Furosemide, saw Cardiology

## 2022-04-04 NOTE — Assessment & Plan Note (Signed)
Stable, takes Keppra, F/u neurology

## 2022-04-04 NOTE — Assessment & Plan Note (Signed)
Stable,  takes Omeprazole. Hgb 12.5 12/19/21

## 2022-04-04 NOTE — Assessment & Plan Note (Signed)
resolved right hand numbness, weakness, difficulty feeding self, changed to ASA 325mg qd from 81mg 11/13/20, on Statin, declined neurology consultation 

## 2022-04-04 NOTE — Assessment & Plan Note (Signed)
Chronic lower back pain/right hip pain,  on Gabapentin              OA, s/p left hip surgery, chronic right hip pain, w/c helps, prn Tylenol 

## 2022-04-04 NOTE — Assessment & Plan Note (Signed)
due to unsteady on his legs, attempting standing up or transfer with no assistance.

## 2022-04-04 NOTE — Assessment & Plan Note (Signed)
Hx of CAD, hyperlipidemia, on Crestor, ASA. LDL 61 07/18/21.  10/09/20 EKG SR vent rate 65, no significant ST-T changes.  

## 2022-04-04 NOTE — Assessment & Plan Note (Signed)
onset 09/2019, heart rate is controlled, on ASA 

## 2022-04-07 ENCOUNTER — Encounter: Payer: Self-pay | Admitting: Nurse Practitioner

## 2022-05-13 ENCOUNTER — Non-Acute Institutional Stay (SKILLED_NURSING_FACILITY): Payer: Medicare HMO | Admitting: Family Medicine

## 2022-05-13 DIAGNOSIS — G40209 Localization-related (focal) (partial) symptomatic epilepsy and epileptic syndromes with complex partial seizures, not intractable, without status epilepticus: Secondary | ICD-10-CM | POA: Diagnosis not present

## 2022-05-13 DIAGNOSIS — I10 Essential (primary) hypertension: Secondary | ICD-10-CM | POA: Diagnosis not present

## 2022-05-13 DIAGNOSIS — I872 Venous insufficiency (chronic) (peripheral): Secondary | ICD-10-CM | POA: Diagnosis not present

## 2022-05-13 DIAGNOSIS — N138 Other obstructive and reflux uropathy: Secondary | ICD-10-CM | POA: Diagnosis not present

## 2022-05-13 DIAGNOSIS — N401 Enlarged prostate with lower urinary tract symptoms: Secondary | ICD-10-CM | POA: Diagnosis not present

## 2022-05-13 DIAGNOSIS — I482 Chronic atrial fibrillation, unspecified: Secondary | ICD-10-CM

## 2022-05-13 NOTE — Progress Notes (Signed)
Provider:  Alain Honey, MD Location:      Place of Service:     PCP: Wardell Honour, MD Patient Care Team: Wardell Honour, MD as PCP - General (Family Medicine) Gatha Mayer, MD as Consulting Physician (Gastroenterology) [name], [name], MD as Consulting Physician (Cardiology) [name], MD as Attending Physician (Internal Medicine) Mast, Man X, NP as Nurse Practitioner (Internal Medicine)  Extended Emergency Contact Information Primary Emergency Contact: Westley,[name] Address: [place], [place] [address] [name] of [place] Phone: [phone] Mobile Phone: [phone] Relation: Daughter Secondary Emergency Contact: Mastrianni,[name] Address: [place] [address] [name] of [place] Phone: [phone] Mobile Phone: [phone] Relation: Son  Code Status:  Goals of Care: Advanced Directive information    04/04/2022    9:24 AM  Advanced Directives  Does Patient Have a Medical Advance Directive? Yes  Type of Paramedic of [place];Out of facility DNR (pink MOST or yellow form)  Does patient want to make changes to medical advance directive? No - Patient declined      No chief complaint on file.   HPI: Patient is a 88 y.o. male seen today for medical management of chronic diseases including venous insufficiency, atrial fibrillation, hypertension, history of CAD, gout arthritis, and seizure. No complaints today.  Patient has ongoing issues with venous insufficiency and leg ulcers but staff is aware of this problem and does the dressings here.  Has formally been a patient of the wound center.  He is on diuretic therapy for edema.  Patient has history of falls when attempting to stand up or transfer with no assistance.  Spends most of his time in wheelchair or the bed. There is a history of A-fib onset 2 years ago.  He is not on a rate controlling drug or  anticoagulant. There is a history of seizure disorder and this is managed with Keppra. Blood pressures are controlled without loaded pain and lisinopril   Past Medical History:  Diagnosis Date   Allergic rhinitis    Amnesia    Atherosclerotic heart disease of native coronary artery without angina pectoris    BPH (benign prostatic hyperplasia)    Bradycardia    Chronic diastolic (congestive) heart failure (HCC)    Colon polyps    Coronary artery disease    MODERATE   Dizziness    Edema of lower extremity    Erectile dysfunction    H. pylori infection    Hx of    Hard of hearing    Headache(784.0)    History of colon polyps 2009   Dr. Orr/Colonoscopy -small cecal adenoma   History of falling    Hx of colonoscopy 2009   Dr Lajoyce Corners, hemorrhoids & polyps   Hyperlipidemia    Hypertension    Hypertensive heart disease with heart failure ([place])    Hypokalemia    Internal hemorrhoids 2009   Dr. Lajoyce Corners Colonoscopy   Metabolic encephalopathy    Mitral valve regurgitation    Muscle weakness (generalized)    Pneumonia    Primary generalized (osteo)arthritis    Reflux esophagitis    Sepsis ([place]) 11/04/2018   Unspecified hemorrhoids    Unsteadiness on feet    Vitamin D deficiency    Past Surgical History:  Procedure Laterality Date   [place]  REVEALED MILD TO MODERATE IRREGULARITIES. HIS LEFT EVNTRICULAR SYSTOLIC FUNCTION REVEALED NORMAL EJECTION FRACTION WITH EF OF 70%   CATARACT EXTRACTION, BILATERAL     COLONOSCOPY  multiple   fatty tumor removal     benign   HEMORROIDECTOMY     HIP ARTHROPLASTY Left 08/17/2013   Procedure: ARTHROPLASTY BIPOLAR HIP;  Surgeon: Gearlean Alf, MD;  Location: [facility];  Service: Orthopedics;  Laterality: Left;   INGUINAL HERNIA REPAIR Bilateral 2006   lower back surgery  2006   NASAL SEPTUM SURGERY     TONSILLECTOMY     UPPER GASTROINTESTINAL ENDOSCOPY      reports that he quit smoking about 46 years ago.  His smoking use included cigarettes. He has never used smokeless tobacco. He reports current alcohol use. He reports that he does not use drugs. Social History   Socioeconomic History   Marital status: Widowed    Spouse name: Not on file   Number of children: 3   Years of education: Not on file   Highest education level: Not on file  Occupational History   Not on file  Tobacco Use   Smoking status: Former    Types: Cigarettes    Quit date: 10/25/1975    Years since quitting: 46.5   Smokeless tobacco: Never  Vaping Use   Vaping Use: Never used  Substance and Sexual Activity   Alcohol use: Yes    Comment: occ 6-7 times a year   Drug use: No   Sexual activity: Not on file  Other Topics Concern   Not on file  Social History Narrative   Retired and widowed   Lives at [place] [facility]   Right handed   Social Determinants of Health   Financial Resource Strain: Not on file  Food Insecurity: Not on file  Transportation Needs: Not on file  Physical Activity: Not on file  Stress: Not on file  Social Connections: Not on file  Intimate Partner Violence: Not on file    Functional Status Survey:    Family History  Problem Relation Age of Onset   Colon cancer Mother 49       second cancer in 59's deceased after emergency surgery   Breast cancer Sister    Seizures Neg Hx     Health Maintenance  Topic Date Due   Zoster Vaccines- Shingrix (2 of 2) 09/28/2021   INFLUENZA VACCINE  01/14/2022   COVID-19 Vaccine (6 - 2023-24 season) 02/14/2022   Pneumonia Vaccine 62+ Years old  Completed   HPV VACCINES  Aged Out    No Known Allergies  Outpatient Encounter Medications as of 05/13/2022  Medication Sig   acetaminophen (TYLENOL) 500 MG tablet Take 500 mg by mouth every 8 (eight) hours as needed.   amLODipine (NORVASC) 5 MG tablet Take 5 mg by mouth daily.   aspirin 325 MG tablet Take 325 mg by mouth daily.   DEXTRAN 70-HYPROMELLOSE OP Apply 1 drop to eye 3 (three) times  daily as needed. Special Instructions: as needed for dry eyes/itching   Emollient (CERAVE) CREA Apply topically at bedtime. To lower extremities @ bedtime   hydrocortisone (ANUSOL-HC) 2.5 % rectal cream Place 1 application rectally 2 (two) times daily as needed for anal itching or hemorrhoids (USE PERINEAL APPLICATOR). With perineal applicator   levETIRAcetam (KEPPRA) 500 MG tablet Take 500 mg by mouth 2 (two) times daily.   lisinopril (ZESTRIL) 40 MG tablet Take 40 mg by mouth daily.   Multiple Vitamins-Minerals (THERA-M)  TABS Take 1 tablet by mouth daily.   omeprazole (PRILOSEC) 20 MG capsule Take 20 mg by mouth daily.   potassium chloride SA (KLOR-CON) 20 MEQ tablet Take 20 mEq by mouth daily.   rosuvastatin (CRESTOR) 5 MG tablet Take 5 mg by mouth daily.    torsemide (DEMADEX) 20 MG tablet Take 20 mg by mouth daily.   vitamin B-12 (CYANOCOBALAMIN) 1000 MCG tablet Take 1,000 mcg by mouth daily.   No facility-administered encounter medications on file as of 05/13/2022.    Review of Systems  Constitutional: Negative.   HENT:  Positive for hearing loss.   Respiratory: Negative.    Cardiovascular:  Positive for leg swelling.  Genitourinary: Negative.   Musculoskeletal:  Positive for gait problem.  Neurological:  Positive for seizures.  Psychiatric/Behavioral:  Positive for confusion.   All other systems reviewed and are negative.   There were no vitals filed for this visit. There is no height or weight on file to calculate BMI. Physical Exam Vitals and nursing note reviewed.  Constitutional:      Appearance: Normal appearance.  Eyes:     Extraocular Movements: Extraocular movements intact.     Conjunctiva/sclera: Conjunctivae normal.     Pupils: Pupils are equal, round, and reactive to light.  Cardiovascular:     Rate and Rhythm: Normal rate and regular rhythm.     Comments: Rhythm and rate are appear to be in sinus today Pulmonary:     Effort: Pulmonary effort is normal.      Breath sounds: Normal breath sounds.  Abdominal:     General: Bowel sounds are normal.  Neurological:     General: No focal deficit present.     Mental Status: He is alert.     Comments: Patient converses easily but has some problems with confusion and trying to remember what he wants to say.  He was advising me on [employer] and tax matters but some of what he said was not accurate  Psychiatric:        Mood and Affect: Mood normal.        Behavior: Behavior normal.     Labs reviewed: Basic Metabolic Panel: Recent Labs    07/18/21 0000 10/15/21 0000 11/21/21 0000  NA 141 143 143  K 4.3 5.1 4.1  CL 102 106 108  CO2 31* 24* 30*  BUN 16 35* 22*  CREATININE 1.0 1.3 1.1  CALCIUM 9.3 9.6 8.7   Liver Function Tests: Recent Labs    07/18/21 0000  AST 19  ALT 17  ALKPHOS 61  ALBUMIN 3.9   No results for input(s): "LIPASE", "AMYLASE" in the last 8760 hours. No results for input(s): "AMMONIA" in the last 8760 hours. CBC: Recent Labs    07/18/21 0000 12/19/21 0000  WBC 7.4 8.2  NEUTROABS  --  4,674.00  HGB 14.1 12.5*  HCT 41 36*  PLT 223 218   Cardiac Enzymes: No results for input(s): "CKTOTAL", "CKMB", "CKMBINDEX", "TROPONINI" in the last 8760 hours. BNP: Invalid input(s): "POCBNP" No results found for: "HGBA1C" Lab Results  Component Value Date   TSH 1.77 04/06/2020   Lab Results  Component Value Date   VITAMINB12 1,261 (H) 11/01/2018   No results found for: "FOLATE" No results found for: "IRON", "TIBC", "FERRITIN"  Imaging and Procedures obtained prior to SNF admission: No results found.  Assessment/Plan 1. Benign prostatic hyperplasia with urinary obstruction Has seen urology in the past.  Not on any medications for this problem but  does take torsemide for dependent edema  2. Chronic atrial fibrillation (HCC) To be in sinus rhythm today so likely paroxysmal in nature  3. 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 Primary hypertension Blood pressure controlled combination lisinopril and amlodipine  5. Venous insufficiency (chronic) (peripheral) Legs are wrapped today with Coban and dressing but are in dependent position when he is in his wheelchair    Family/ staff Communication:   Labs/tests ordered: none  Lillette Boxer. Sabra Heck, [place] [address] [place], [place] Office [phone]

## 2022-05-14 DIAGNOSIS — M25662 Stiffness of left knee, not elsewhere classified: Secondary | ICD-10-CM | POA: Diagnosis not present

## 2022-05-14 DIAGNOSIS — M6281 Muscle weakness (generalized): Secondary | ICD-10-CM | POA: Diagnosis not present

## 2022-05-14 DIAGNOSIS — M25661 Stiffness of right knee, not elsewhere classified: Secondary | ICD-10-CM | POA: Diagnosis not present

## 2022-05-15 DIAGNOSIS — M25661 Stiffness of right knee, not elsewhere classified: Secondary | ICD-10-CM | POA: Diagnosis not present

## 2022-05-15 DIAGNOSIS — M6281 Muscle weakness (generalized): Secondary | ICD-10-CM | POA: Diagnosis not present

## 2022-05-15 DIAGNOSIS — M25662 Stiffness of left knee, not elsewhere classified: Secondary | ICD-10-CM | POA: Diagnosis not present

## 2022-05-19 DIAGNOSIS — R41841 Cognitive communication deficit: Secondary | ICD-10-CM | POA: Diagnosis not present

## 2022-05-19 DIAGNOSIS — M25662 Stiffness of left knee, not elsewhere classified: Secondary | ICD-10-CM | POA: Diagnosis not present

## 2022-05-19 DIAGNOSIS — M6281 Muscle weakness (generalized): Secondary | ICD-10-CM | POA: Diagnosis not present

## 2022-05-19 DIAGNOSIS — M25661 Stiffness of right knee, not elsewhere classified: Secondary | ICD-10-CM | POA: Diagnosis not present

## 2022-05-19 DIAGNOSIS — R1312 Dysphagia, oropharyngeal phase: Secondary | ICD-10-CM | POA: Diagnosis not present

## 2022-05-20 ENCOUNTER — Non-Acute Institutional Stay (SKILLED_NURSING_FACILITY): Payer: Medicare HMO | Admitting: Family Medicine

## 2022-05-20 DIAGNOSIS — H1031 Unspecified acute conjunctivitis, right eye: Secondary | ICD-10-CM

## 2022-05-20 DIAGNOSIS — H109 Unspecified conjunctivitis: Secondary | ICD-10-CM | POA: Insufficient documentation

## 2022-05-20 NOTE — Progress Notes (Deleted)
.  smm

## 2022-05-20 NOTE — Progress Notes (Signed)
Provider:  Alain Honey, MD Location:      Place of Service:     PCP: Wardell Honour, MD Patient Care Team: Wardell Honour, MD as PCP - General (Family Medicine) [name] Mayer, MD as Consulting Physician (Gastroenterology) [name], [name], MD as Consulting Physician (Cardiology) [name], MD as Attending Physician (Internal Medicine) Mast, Man X, NP as Nurse Practitioner (Internal Medicine)  Extended Emergency Contact Information Primary Emergency Contact: [name],[name] Address: [place], [place] [address] [name] of [place] Phone: [phone] Mobile Phone: [phone] Relation: Daughter Secondary Emergency Contact: [name],[name] Address: [place] [address] [name] of [place] Phone: [phone] Mobile Phone: [phone] Relation: Son  Code Status:  Goals of Care: Advanced Directive information    04/04/2022    9:24 AM  Advanced Directives  Does Patient Have a Medical Advance Directive? Yes  Type of Paramedic of [place];Out of facility DNR (pink MOST or yellow form)  Does patient want to make changes to medical advance directive? No - Patient declined      No chief complaint on file.   HPI: Patient is a 88 y.o. male seen today for drainage in R eye.  Nurse requested evaluation to rule out conjunctivitis.  Patient was noted this morning to have drainage from the right eye.  Lids were stuck together when he woke up this morning from the drainage.  He denies any pain or itching.  Past Medical History:  Diagnosis Date   Allergic rhinitis    Amnesia    Atherosclerotic heart disease of native coronary artery without angina pectoris    BPH (benign prostatic hyperplasia)    Bradycardia    Chronic diastolic (congestive) heart failure (HCC)    Colon polyps    Coronary artery disease    MODERATE   Dizziness    Edema of lower extremity     Erectile dysfunction    H. pylori infection    Hx of    Hard of hearing    Headache(784.0)    History of colon polyps 2009   Dr. Orr/Colonoscopy -small cecal adenoma   History of falling    Hx of colonoscopy 2009   Dr Lajoyce Corners, hemorrhoids & polyps   Hyperlipidemia    Hypertension    Hypertensive heart disease with heart failure ([place])    Hypokalemia    Internal hemorrhoids 2009   Dr. Lajoyce Corners Colonoscopy   Metabolic encephalopathy    Mitral valve regurgitation    Muscle weakness (generalized)    Pneumonia    Primary generalized (osteo)arthritis    Reflux esophagitis    Sepsis ([place]) 11/04/2018   Unspecified hemorrhoids    Unsteadiness on feet    Vitamin D deficiency    Past Surgical History:  Procedure Laterality Date   APPENDECTOMY     CARDIAC CATHETERIZATION  1997   REVEALED MILD TO MODERATE IRREGULARITIES. HIS LEFT EVNTRICULAR SYSTOLIC FUNCTION REVEALED NORMAL EJECTION FRACTION WITH EF OF 70%   CATARACT EXTRACTION, BILATERAL     COLONOSCOPY  multiple   fatty tumor removal     benign   HEMORROIDECTOMY     HIP ARTHROPLASTY Left 08/17/2013   Procedure: ARTHROPLASTY BIPOLAR HIP;  Surgeon: Gearlean Alf, MD;  Location: [facility];  Service: Orthopedics;  Laterality: Left;   INGUINAL HERNIA REPAIR Bilateral 2006   lower back surgery  2006  NASAL SEPTUM SURGERY     TONSILLECTOMY     UPPER GASTROINTESTINAL ENDOSCOPY      reports that he quit smoking about 46 years ago. His smoking use included cigarettes. He has never used smokeless tobacco. He reports current alcohol use. He reports that he does not use drugs. Social History   Socioeconomic History   Marital status: Widowed    Spouse name: Not on file   Number of children: 3   Years of education: Not on file   Highest education level: Not on file  Occupational History   Not on file  Tobacco Use   Smoking status: Former    Types: Cigarettes    Quit date: 10/25/1975    Years since quitting: 46.6   Smokeless tobacco: Never   Vaping Use   Vaping Use: Never used  Substance and Sexual Activity   Alcohol use: Yes    Comment: occ 6-7 times a year   Drug use: No   Sexual activity: Not on file  Other Topics Concern   Not on file  Social History Narrative   Retired and widowed   Lives at [place]   Right handed   Social Determinants of Health   Financial Resource Strain: Not on file  Food Insecurity: Not on file  Transportation Needs: Not on file  Physical Activity: Not on file  Stress: Not on file  Social Connections: Not on file  Intimate Partner Violence: Not on file    Functional Status Survey:    Family History  Problem Relation Age of Onset   Colon cancer Mother 80       second cancer in 52's deceased after emergency surgery   Breast cancer Sister    Seizures Neg Hx     Health Maintenance  Topic Date Due   DTaP/Tdap/Td (1 - Tdap) 08/15/2013   Zoster Vaccines- Shingrix (2 of 2) 09/28/2021   INFLUENZA VACCINE  01/14/2022   COVID-19 Vaccine (6 - 2023-24 season) 02/14/2022   Pneumonia Vaccine 62+ Years old  Completed   HPV VACCINES  Aged Out    No Known Allergies  Outpatient Encounter Medications as of 05/20/2022  Medication Sig   acetaminophen (TYLENOL) 500 MG tablet Take 500 mg by mouth every 8 (eight) hours as needed.   amLODipine (NORVASC) 5 MG tablet Take 5 mg by mouth daily.   aspirin 325 MG tablet Take 325 mg by mouth daily.   DEXTRAN 70-HYPROMELLOSE OP Apply 1 drop to eye 3 (three) times daily as needed. Special Instructions: as needed for dry eyes/itching   Emollient (CERAVE) CREA Apply topically at bedtime. To lower extremities @ bedtime   hydrocortisone (ANUSOL-HC) 2.5 % rectal cream Place 1 application rectally 2 (two) times daily as needed for anal itching or hemorrhoids (USE PERINEAL APPLICATOR). With perineal applicator   levETIRAcetam (KEPPRA) 500 MG tablet Take 500 mg by mouth 2 (two) times daily.   lisinopril (ZESTRIL) 40 MG tablet Take 40 mg by mouth  daily.   Multiple Vitamins-Minerals (THERA-M) TABS Take 1 tablet by mouth daily.   omeprazole (PRILOSEC) 20 MG capsule Take 20 mg by mouth daily.   potassium chloride SA (KLOR-CON) 20 MEQ tablet Take 20 mEq by mouth daily.   rosuvastatin (CRESTOR) 5 MG tablet Take 5 mg by mouth daily.    torsemide (DEMADEX) 20 MG tablet Take 20 mg by mouth daily.   vitamin B-12 (CYANOCOBALAMIN) 1000 MCG tablet Take 1,000 mcg by mouth daily.   No facility-administered encounter medications  on file as of 05/20/2022.    Review of Systems  Eyes:  Positive for discharge and redness.  All other systems reviewed and are negative.   There were no vitals filed for this visit. There is no height or weight on file to calculate BMI. Physical Exam Vitals and nursing note reviewed.  Constitutional:      Appearance: Normal appearance.  Eyes:     General:        Right eye: No discharge.     Pupils: Pupils are equal, round, and reactive to light.     Comments: There is some erythema of the conjunctiva as well as purulent drainage.  This involves right eye only.  Neurological:     Mental Status: He is alert.     Labs reviewed: Basic Metabolic Panel: Recent Labs    07/18/21 0000 10/15/21 0000 11/21/21 0000  NA 141 143 143  K 4.3 5.1 4.1  CL 102 106 108  CO2 31* 24* 30*  BUN 16 35* 22*  CREATININE 1.0 1.3 1.1  CALCIUM 9.3 9.6 8.7   Liver Function Tests: Recent Labs    07/18/21 0000  AST 19  ALT 17  ALKPHOS 61  ALBUMIN 3.9   No results for input(s): "LIPASE", "AMYLASE" in the last 8760 hours. No results for input(s): "AMMONIA" in the last 8760 hours. CBC: Recent Labs    07/18/21 0000 12/19/21 0000  WBC 7.4 8.2  NEUTROABS  --  4,674.00  HGB 14.1 12.5*  HCT 41 36*  PLT 223 218   Cardiac Enzymes: No results for input(s): "CKTOTAL", "CKMB", "CKMBINDEX", "TROPONINI" in the last 8760 hours. BNP: Invalid input(s): "POCBNP" No results found for: "HGBA1C" Lab Results  Component Value Date    TSH 1.77 04/06/2020   Lab Results  Component Value Date   VITAMINB12 1,261 (H) 11/01/2018   No results found for: "FOLATE" No results found for: "IRON", "TIBC", "FERRITIN"  Imaging and Procedures obtained prior to SNF admission: No results found.  Assessment/Plan 1. Acute conjunctivitis of right eye, unspecified acute conjunctivitis type Patient appears to have a acute conjunctivitis.  I would favor this more likely to be bacterial versus viral since the drainage is purulent.  Will treat with a short course of Garamycin ophthalmic solution for 3 to 5 days    Family/ staff Communication:   Labs/tests ordered:  Lillette Boxer. Sabra Heck, [place] [address] [place], [place] Office [phone]

## 2022-05-21 DIAGNOSIS — M25662 Stiffness of left knee, not elsewhere classified: Secondary | ICD-10-CM | POA: Diagnosis not present

## 2022-05-21 DIAGNOSIS — M6281 Muscle weakness (generalized): Secondary | ICD-10-CM | POA: Diagnosis not present

## 2022-05-21 DIAGNOSIS — R41841 Cognitive communication deficit: Secondary | ICD-10-CM | POA: Diagnosis not present

## 2022-05-21 DIAGNOSIS — M25661 Stiffness of right knee, not elsewhere classified: Secondary | ICD-10-CM | POA: Diagnosis not present

## 2022-05-21 DIAGNOSIS — R1312 Dysphagia, oropharyngeal phase: Secondary | ICD-10-CM | POA: Diagnosis not present

## 2022-05-22 DIAGNOSIS — R1312 Dysphagia, oropharyngeal phase: Secondary | ICD-10-CM | POA: Diagnosis not present

## 2022-05-22 DIAGNOSIS — R41841 Cognitive communication deficit: Secondary | ICD-10-CM | POA: Diagnosis not present

## 2022-05-22 DIAGNOSIS — M25661 Stiffness of right knee, not elsewhere classified: Secondary | ICD-10-CM | POA: Diagnosis not present

## 2022-05-22 DIAGNOSIS — M25662 Stiffness of left knee, not elsewhere classified: Secondary | ICD-10-CM | POA: Diagnosis not present

## 2022-05-22 DIAGNOSIS — M6281 Muscle weakness (generalized): Secondary | ICD-10-CM | POA: Diagnosis not present

## 2022-05-26 ENCOUNTER — Encounter: Payer: Self-pay | Admitting: Nurse Practitioner

## 2022-05-26 ENCOUNTER — Non-Acute Institutional Stay (SKILLED_NURSING_FACILITY): Payer: Medicare HMO | Admitting: Nurse Practitioner

## 2022-05-26 DIAGNOSIS — E782 Mixed hyperlipidemia: Secondary | ICD-10-CM | POA: Diagnosis not present

## 2022-05-26 DIAGNOSIS — N401 Enlarged prostate with lower urinary tract symptoms: Secondary | ICD-10-CM | POA: Diagnosis not present

## 2022-05-26 DIAGNOSIS — R5383 Other fatigue: Secondary | ICD-10-CM | POA: Diagnosis not present

## 2022-05-26 DIAGNOSIS — I10 Essential (primary) hypertension: Secondary | ICD-10-CM | POA: Diagnosis not present

## 2022-05-26 DIAGNOSIS — K219 Gastro-esophageal reflux disease without esophagitis: Secondary | ICD-10-CM | POA: Diagnosis not present

## 2022-05-26 DIAGNOSIS — D72825 Bandemia: Secondary | ICD-10-CM | POA: Diagnosis not present

## 2022-05-26 DIAGNOSIS — M159 Polyosteoarthritis, unspecified: Secondary | ICD-10-CM

## 2022-05-26 DIAGNOSIS — I482 Chronic atrial fibrillation, unspecified: Secondary | ICD-10-CM

## 2022-05-26 DIAGNOSIS — Z8673 Personal history of transient ischemic attack (TIA), and cerebral infarction without residual deficits: Secondary | ICD-10-CM | POA: Diagnosis not present

## 2022-05-26 DIAGNOSIS — N138 Other obstructive and reflux uropathy: Secondary | ICD-10-CM

## 2022-05-26 DIAGNOSIS — I872 Venous insufficiency (chronic) (peripheral): Secondary | ICD-10-CM

## 2022-05-26 DIAGNOSIS — R569 Unspecified convulsions: Secondary | ICD-10-CM | POA: Diagnosis not present

## 2022-05-26 DIAGNOSIS — R4182 Altered mental status, unspecified: Secondary | ICD-10-CM | POA: Diagnosis not present

## 2022-05-26 DIAGNOSIS — I5032 Chronic diastolic (congestive) heart failure: Secondary | ICD-10-CM

## 2022-05-26 DIAGNOSIS — N179 Acute kidney failure, unspecified: Secondary | ICD-10-CM

## 2022-05-26 DIAGNOSIS — R748 Abnormal levels of other serum enzymes: Secondary | ICD-10-CM

## 2022-05-26 DIAGNOSIS — R300 Dysuria: Secondary | ICD-10-CM | POA: Diagnosis not present

## 2022-05-26 DIAGNOSIS — N1831 Chronic kidney disease, stage 3a: Secondary | ICD-10-CM | POA: Diagnosis not present

## 2022-05-26 LAB — CBC AND DIFFERENTIAL
HCT: 32 — AB (ref 41–53)
Hemoglobin: 11.3 — AB (ref 13.5–17.5)
Neutrophils Absolute: 31790
Platelets: 172 10*3/uL (ref 150–400)
WBC: 34

## 2022-05-26 LAB — CBC: RBC: 3.43 — AB (ref 3.87–5.11)

## 2022-05-26 LAB — HEPATIC FUNCTION PANEL
ALT: 96 U/L — AB (ref 10–40)
AST: 85 — AB (ref 14–40)
Bilirubin, Total: 1

## 2022-05-26 LAB — BASIC METABOLIC PANEL
BUN: 28 — AB (ref 4–21)
CO2: 25 — AB (ref 13–22)
Chloride: 105 (ref 99–108)
Glucose: 147
Potassium: 4.4 mEq/L (ref 3.5–5.1)
Sodium: 140 (ref 137–147)

## 2022-05-26 LAB — COMPREHENSIVE METABOLIC PANEL
Albumin: 3.4 — AB (ref 3.5–5.0)
Globulin: 2.4

## 2022-05-26 NOTE — Progress Notes (Unsigned)
Location:   [facility] Room Number: 46 Place of Service:  SNF (31) Provider:  Damin Salido X, NP  Wardell Honour, MD  Patient Care Team: Wardell Honour, MD as PCP - General (Family Medicine) Gatha Mayer, MD as Consulting Physician (Gastroenterology) [name], [name], MD as Consulting Physician (Cardiology) [name], MD as Attending Physician (Internal Medicine) Sarena Jezek X, NP as Nurse Practitioner (Internal Medicine)  Extended Emergency Contact Information Primary Emergency Contact: Buechner,[name] Address: [place], [place] [address] [name] of [place] Phone: [phone] Mobile Phone: [phone] Relation: Daughter Secondary Emergency Contact: Kosar,[name] Address: [place] [address] [name] of [place] Phone: [phone] Mobile Phone: [phone] Relation: Son  Code Status:  DNR Goals of care: Advanced Directive information    05/26/2022   11:38 AM  Advanced Directives  Does Patient Have a Medical Advance Directive? Yes  Type of Advance Directive Living will;Out of facility DNR (pink MOST or yellow form)  Does patient want to make changes to medical advance directive? No - Patient declined  Pre-existing out of facility DNR order (yellow form or pink MOST form) Yellow form placed in chart (order not valid for inpatient use)     Chief Complaint  Patient presents with   Acute Visit    Fever, lethargy    HPI:  Pt is a 88 y.o. male seen today for an acute visit for the patient's low grade fever, lethargy, ? Lower abd discomfort. No focal neurological symptoms, negative COVID  Chronic venous insuffiencey, Hx of ulcers of BLE, compression wrap,  f/u wound care center, treated for infected wounds/cellulitis in the past.              TIA, resolved right hand numbness, weakness, difficulty feeding self, changed to ASA 365m qd from 837m5/31/22, on  Statin, declined neurology consultation.              Recurrent falls, due to unsteady on his legs, attempting standing up or transfer with no assistance.              BPH, saw urology. Chronic cystitis              PVD, BLE brownish erythematous pigmentation, swelling, Hx of venous stasis ulcers.              Afib, onset 09/2019, heart rate is controlled, on ASA             CHF, EF 6540-81%grade 1 diastolic dysfunction, on Furosemide, saw Cardiology             HTN, blood pressure is controled on amlodipine, Lisinopril. Bun/creat 22/1.1 11/21/21             Hx of CAD, hyperlipidemia, on Crestor, ASA. LDL 61 07/18/21.  10/09/20 EKG SR vent rate 65, no significant ST-T changes.              GERD, takes Omeprazole. Hgb 12.5 12/19/21             Insomnia, stable, off Trazodone.              Chronic lower back pain/right hip pain,  on Gabapentin              OA, s/p left hip surgery, chronic right hip pain, w/c helps, prn Tylenol  Seizure: takes Keppra, F/u neurology    Past Medical History:  Diagnosis Date   Allergic rhinitis    Amnesia    Atherosclerotic heart disease of native coronary artery without angina pectoris    BPH (benign prostatic hyperplasia)    Bradycardia    Chronic diastolic (congestive) heart failure (HCC)    Colon polyps    Coronary artery disease    MODERATE   Dizziness    Edema of lower extremity    Erectile dysfunction    H. pylori infection    Hx of    Hard of hearing    Headache(784.0)    History of colon polyps 2009   Dr. Orr/Colonoscopy -small cecal adenoma   History of falling    Hx of colonoscopy 2009   Dr Lajoyce Corners, hemorrhoids & polyps   Hyperlipidemia    Hypertension    Hypertensive heart disease with heart failure ([place])    Hypokalemia    Internal hemorrhoids 2009   Dr. Lajoyce Corners Colonoscopy   Metabolic encephalopathy    Mitral valve regurgitation    Muscle weakness (generalized)    Pneumonia    Primary generalized (osteo)arthritis    Reflux  esophagitis    Sepsis ([place]) 11/04/2018   Unspecified hemorrhoids    Unsteadiness on feet    Vitamin D deficiency    Past Surgical History:  Procedure Laterality Date   APPENDECTOMY     CARDIAC CATHETERIZATION  1997   REVEALED MILD TO MODERATE IRREGULARITIES. HIS LEFT EVNTRICULAR SYSTOLIC FUNCTION REVEALED NORMAL EJECTION FRACTION WITH EF OF 70%   CATARACT EXTRACTION, BILATERAL     COLONOSCOPY  multiple   fatty tumor removal     benign   HEMORROIDECTOMY     HIP ARTHROPLASTY Left 08/17/2013   Procedure: ARTHROPLASTY BIPOLAR HIP;  Surgeon: Gearlean Alf, MD;  Location: [facility];  Service: Orthopedics;  Laterality: Left;   INGUINAL HERNIA REPAIR Bilateral 2006   lower back surgery  2006   NASAL SEPTUM SURGERY     TONSILLECTOMY     UPPER GASTROINTESTINAL ENDOSCOPY      No Known Allergies  Allergies as of 05/26/2022   No Known Allergies      Medication List        Accurate as of May 26, 2022 11:59 PM. If you have any questions, ask your nurse or doctor.          acetaminophen 500 MG tablet Commonly known as: TYLENOL Take 500 mg by mouth every 8 (eight) hours as needed.   amLODipine 5 MG tablet Commonly known as: NORVASC Take 5 mg by mouth daily.   ARTIFICIAL TEARS OP Apply 1 drop to eye as needed.   aspirin 325 MG tablet Take 325 mg by mouth daily.   CeraVe Crea Apply topically at bedtime. To lower extremities @ bedtime   cyanocobalamin 1000 MCG tablet Commonly known as: VITAMIN B12 Take 1,000 mcg by mouth daily.   DEXTRAN 70-HYPROMELLOSE OP Apply 1 drop to eye 3 (three) times daily as needed. Special Instructions: as needed for dry eyes/itching   hydrocortisone 2.5 % rectal cream Commonly known as: ANUSOL-HC Place 1 application rectally 2 (two) times daily as needed for anal itching or hemorrhoids (USE PERINEAL APPLICATOR). With perineal applicator   levETIRAcetam 500 MG tablet Commonly known as: KEPPRA Take 500 mg by mouth 2 (two) times daily.    lisinopril 40 MG tablet Commonly known as: ZESTRIL Take 40 mg by mouth daily.   omeprazole 20 MG capsule Commonly known  as: PRILOSEC Take 20 mg by mouth daily.   potassium chloride SA 20 MEQ tablet Commonly known as: KLOR-CON M Take 20 mEq by mouth daily.   rosuvastatin 5 MG tablet Commonly known as: CRESTOR Take 5 mg by mouth daily.   Thera-M Tabs Take 1 tablet by mouth daily.   torsemide 20 MG tablet Commonly known as: DEMADEX Take 20 mg by mouth daily.        Review of Systems  Constitutional:  Positive for activity change, appetite change, fatigue and fever.  HENT:  Positive for hearing loss. Negative for congestion and voice change.   Eyes:  Negative for visual disturbance.  Respiratory:  Negative for cough, shortness of breath and wheezing.   Cardiovascular:  Positive for leg swelling.  Gastrointestinal:  Negative for constipation.       ? Suprapubic area discomfort.   Genitourinary:  Positive for frequency. Negative for difficulty urinating and urgency.  Musculoskeletal:  Positive for arthralgias, back pain and gait problem.       Lower back, right hip/thigh pain   Skin:  Negative for color change.       Chronic venous insufficiency skin changes BLE, venous ulcers BLE covered in dressing  Neurological:  Negative for seizures, weakness and headaches.       Memory lapses. Tingling in toes. Right hand weakness/numbness is improved, placed ASA 325 11/13/20  Psychiatric/Behavioral:  Negative for behavioral problems and sleep disturbance. The patient is not nervous/anxious.     Immunization History  Administered Date(s) Administered   Influenza, High Dose Seasonal PF 03/19/2019, 03/19/2019   Influenza-Unspecified 04/08/2016, 03/27/2020, 04/03/2021   Moderna Sars-Covid-2 Vaccination 06/18/2019, 07/16/2019, 04/24/2020, 11/13/2020   Pfizer Covid-19 Vaccine Bivalent Booster 40yr & up 03/05/2021   Pneumococcal Conjugate-13 06/02/2013   Pneumococcal Polysaccharide-23  04/07/2001   Tetanus 08/14/2013   Zoster Recombinat (Shingrix) 08/03/2021   Pertinent  Health Maintenance Due  Topic Date Due   INFLUENZA VACCINE  01/14/2022      09/22/2019    7:30 AM 09/22/2019    9:00 PM 09/23/2019    9:30 AM 01/12/2020    3:02 AM 04/04/2022    9:23 AM  Fall Risk  Falls in the past year?     0  Was there an injury with Fall?     0  Fall Risk Category Calculator     0  Fall Risk Category     Low  Patient Fall Risk Level High fall risk High fall risk High fall risk High fall risk Low fall risk  Patient at Risk for Falls Due to     History of fall(s);Impaired balance/gait  Fall risk Follow up     Falls evaluation completed   Functional Status Survey:    Vitals:   05/26/22 1135  BP: (!) 156/85  Pulse: 62  Resp: 18  Temp: 97.9 F (36.6 C)  SpO2: 93%  Weight: 182 lb 9.6 oz (82.8 kg)  Height: _0  (1.702 m)   Body mass index is 28.6 kg/m. Physical Exam Vitals and nursing note reviewed.  Constitutional:      Comments: Lethargic, able to be aroused when spoke to, followed simple directions and answered questions.   HENT:     Head: Normocephalic and atraumatic.     Mouth/Throat:     Mouth: Mucous membranes are moist.  Eyes:     Conjunctiva/sclera: Conjunctivae normal.     Pupils: Pupils are equal, round, and reactive to light.  Cardiovascular:     Rate  and Rhythm: Normal rate and regular rhythm.     Heart sounds: Murmur heard.  Pulmonary:     Breath sounds: No rales.  Abdominal:     General: Bowel sounds are normal.     Palpations: Abdomen is soft.     Tenderness: There is no guarding or rebound.     Comments: ? Suprapubic area discomfort/tenderness.   Genitourinary:    Comments: External hemorrhoids, no bleeding or injury.  Musculoskeletal:     Cervical back: Normal range of motion and neck supple. No muscular tenderness.     Right lower leg: Edema present.     Left lower leg: Edema present.     Comments: 1-2+ edema BLE. W/c for mobility.    Skin:    General: Skin is warm and dry.     Comments: Chronic venous insufficiency skin changes BLE, venous stasis ulcers BLE covered in dressing.   Neurological:     General: No focal deficit present.     Mental Status: He is alert. Mental status is at baseline.     Motor: No weakness.     Coordination: Coordination normal.     Gait: Gait abnormal.     Comments: Oriented to person, place.   Psychiatric:        Mood and Affect: Mood normal.        Behavior: Behavior normal.        Thought Content: Thought content normal.     Labs reviewed: Recent Labs    07/18/21 0000 10/15/21 0000 11/21/21 0000  NA 141 143 143  K 4.3 5.1 4.1  CL 102 106 108  CO2 31* 24* 30*  BUN 16 35* 22*  CREATININE 1.0 1.3 1.1  CALCIUM 9.3 9.6 8.7   Recent Labs    07/18/21 0000  AST 19  ALT 17  ALKPHOS 61  ALBUMIN 3.9   Recent Labs    07/18/21 0000 12/19/21 0000  WBC 7.4 8.2  NEUTROABS  --  4,674.00  HGB 14.1 12.5*  HCT 41 36*  PLT 223 218   Lab Results  Component Value Date   TSH 1.77 04/06/2020   No results found for: "HGBA1C" Lab Results  Component Value Date   CHOL 131 07/18/2021   HDL 57 07/18/2021   LDLCALC 61 07/18/2021   TRIG 57 07/18/2021   CHOLHDL 2.7 02/17/2017    Significant Diagnostic Results in last 30 days:  No results found.  Assessment/Plan Lethargy the patient's low grade fever, lethargy, ? Lower abd discomfort. No focal neurological symptoms, negative COVID. Obtain CBC/diff, CMP/eGFR, UA C/S  Venous insufficiency (chronic) (peripheral) Chronic venous insuffiencey, Hx of ulcers of BLE, compression wrap,  f/u wound care center, treated for infected wounds/cellulitis in the past.   History of TIA (transient ischemic attack) resolved right hand numbness, weakness, difficulty feeding self, changed to ASA 310m qd from 859m5/31/22, on Statin, declined neurology consultation.   BPH (benign prostatic hyperplasia) , saw urology. Chronic cystitis  Chronic  [place]rial [place], heart rate is controlled, on ASA  Chronic diastolic heart failure (HCC)  EF 6556-38%grade 1 diastolic dysfunction, dc Furosemide 2/2 acute renal injury,  saw Cardiology  Hypertension blood pressure is controled on amlodipine, Lisinopril. Bun/creat 22/1.1 11/21/21  Hyperlipidemia  Hx of CAD, hyperlipidemia, on Crestor, ASA. LDL 61 07/18/21.  10/09/20 EKG SR vent rate 65, no significant ST-T changes.   GERD (gastroesophageal reflux disease) takes Omeprazole. Hgb 12.5 12/19/21  Osteoarthritis, multiple sites   Chronic  lower back pain/right hip pain,  on Gabapentin              OA, s/p left hip surgery, chronic right hip pain, w/c helps, prn Tylenol  Seizure (HCC) takes Keppra, F/u neurology   Leukocytosis 05/26/22 wbc 34, Hgb 12.5, plt 172, neutrophils 93.5, Na 140, K 4.4, Bun 28, creat 1.64 IVF D5 1/2 NS 80cc/hr x 1065m, Rocephin 1gm IM x 5 days, repeat CMP/eGFR 05/27/22 HPOA desires no hospitalization, will repeat CBC/diff. Pending UA C/S  [place] renal injury ([place] 05/[place] Na 140, K 4.4, Bun 28, creat 1.64, IVF D5 1/2 NS x10052m12/12/23 dc Furosemide, Kcl.  Elevated liver enzymes Mildly elevated AST/ALT 96/85, likely due to infection/leukocytosis/?sepsis.      Family/ staff Communication: plan of care reviewed with the patient and charge nurse.   Labs/tests ordered:   CBC/diff, CMP/eGFR, UA C/S  Time spend 35 minutes.

## 2022-05-26 NOTE — Assessment & Plan Note (Signed)
Chronic venous insuffiencey, Hx of ulcers of BLE, compression wrap,  f/u wound care center, treated for infected wounds/cellulitis in the past.

## 2022-05-26 NOTE — Assessment & Plan Note (Signed)
blood pressure is controled on amlodipine, Lisinopril. Bun/creat 22/1.1 11/21/21

## 2022-05-26 NOTE — Assessment & Plan Note (Signed)
the patient's low grade fever, lethargy, ? Lower abd discomfort. No focal neurological symptoms, negative COVID. Obtain CBC/diff, CMP/eGFR, UA C/S

## 2022-05-26 NOTE — Assessment & Plan Note (Signed)
,   saw urology. Chronic cystitis

## 2022-05-26 NOTE — Assessment & Plan Note (Signed)
resolved right hand numbness, weakness, difficulty feeding self, changed to ASA '325mg'$  qd from '81mg'$  11/13/20, on Statin, declined neurology consultation.

## 2022-05-26 NOTE — Assessment & Plan Note (Signed)
Hx of CAD, hyperlipidemia, on Crestor, ASA. LDL 61 07/18/21.  10/09/20 EKG SR vent rate 65, no significant ST-T changes.

## 2022-05-26 NOTE — Assessment & Plan Note (Signed)
takes Keppra, F/u neurology

## 2022-05-26 NOTE — Assessment & Plan Note (Signed)
Chronic lower back pain/right hip pain,  on Gabapentin              OA, s/p left hip surgery, chronic right hip pain, w/c helps, prn Tylenol

## 2022-05-26 NOTE — Assessment & Plan Note (Signed)
onset 09/2019, heart rate is controlled, on ASA

## 2022-05-26 NOTE — Assessment & Plan Note (Signed)
EF 34-03%, grade 1 diastolic dysfunction, on Furosemide, saw Cardiology

## 2022-05-26 NOTE — Assessment & Plan Note (Signed)
takes Omeprazole. Hgb 12.5 12/19/21

## 2022-05-27 ENCOUNTER — Encounter: Payer: Self-pay | Admitting: Nurse Practitioner

## 2022-05-27 DIAGNOSIS — R748 Abnormal levels of other serum enzymes: Secondary | ICD-10-CM | POA: Insufficient documentation

## 2022-05-27 DIAGNOSIS — D72829 Elevated white blood cell count, unspecified: Secondary | ICD-10-CM | POA: Insufficient documentation

## 2022-05-27 NOTE — Assessment & Plan Note (Addendum)
05/26/22 wbc 34, Hgb 12.5, plt 172, neutrophils 93.5, Na 140, K 4.4, Bun 28, creat 1.64 IVF D5 1/2 NS 80cc/hr x 1000ml, Rocephin 1gm IM x 5 days, repeat CMP/eGFR 05/27/22 HPOA desires no hospitalization, will repeat CBC/diff. Pending UA C/S 

## 2022-05-27 NOTE — Assessment & Plan Note (Signed)
05/26/22 Na 140, K 4.4, Bun 28, creat 1.64, IVF D5 1/2 NS x1070m 05/27/22 dc Furosemide, Kcl.

## 2022-05-27 NOTE — Assessment & Plan Note (Signed)
Mildly elevated AST/ALT 96/85, likely due to infection/leukocytosis/?sepsis.

## 2022-05-28 ENCOUNTER — Non-Acute Institutional Stay (SKILLED_NURSING_FACILITY): Payer: Medicare HMO | Admitting: Adult Health

## 2022-05-28 ENCOUNTER — Encounter: Payer: Self-pay | Admitting: Adult Health

## 2022-05-28 DIAGNOSIS — E876 Hypokalemia: Secondary | ICD-10-CM | POA: Diagnosis not present

## 2022-05-28 DIAGNOSIS — N1831 Chronic kidney disease, stage 3a: Secondary | ICD-10-CM | POA: Diagnosis not present

## 2022-05-28 DIAGNOSIS — N179 Acute kidney failure, unspecified: Secondary | ICD-10-CM | POA: Diagnosis not present

## 2022-05-28 DIAGNOSIS — R531 Weakness: Secondary | ICD-10-CM

## 2022-05-28 DIAGNOSIS — R1312 Dysphagia, oropharyngeal phase: Secondary | ICD-10-CM | POA: Diagnosis not present

## 2022-05-28 DIAGNOSIS — I10 Essential (primary) hypertension: Secondary | ICD-10-CM | POA: Diagnosis not present

## 2022-05-28 DIAGNOSIS — D729 Disorder of white blood cells, unspecified: Secondary | ICD-10-CM

## 2022-05-28 DIAGNOSIS — D72828 Other elevated white blood cell count: Secondary | ICD-10-CM

## 2022-05-28 NOTE — Progress Notes (Signed)
Location:   [place] Room Number: 69 Place of Service:  SNF ([phone]) Provider:  Durenda Age, NP  Wardell Honour, MD  Patient Care Team: Wardell Honour, MD as PCP - General (Family Medicine) Gatha Mayer, MD as Consulting Physician (Gastroenterology) [name], [name], MD as Consulting Physician (Cardiology) [name], MD as Attending Physician (Internal Medicine) Mast, Man X, NP as Nurse Practitioner (Internal Medicine)  Extended Emergency Contact Information Primary Emergency Contact: Sigl,[name] Address: [place], [place] [address] [name] of [place] Phone: [phone] Mobile Phone: [phone] Relation: Daughter Secondary Emergency Contact: Nickey,[name] Address: [place] [address] [name] of [place] Phone: [phone] Mobile Phone: [phone] Relation: Son  Code Status:  DNR Goals of care: Advanced Directive information    05/28/2022    1:20 PM  Advanced Directives  Does Patient Have a Medical Advance Directive? Yes  Type of Paramedic of [place];Living will;Out of facility DNR (pink MOST or yellow form)  Does patient want to make changes to medical advance directive? No - Patient declined  Copy of [place] in Chart? Yes - validated most recent copy scanned in chart (See row information)  Pre-existing out of facility DNR order (yellow form or pink MOST form) Yellow form placed in chart (order not valid for inpatient use)     Chief Complaint  Patient presents with   Acute Visit    Leukocytosis    HPI:  Pt is a 88 y.o. male seen today for an acute visit for follow up of leukocytosis. He was recently started on Ceftriaxone due to wbc 34. He has poor oral intake and was given IV fluid D5 1/2 NS X 1 L via peripheral IV. Repeat BMP showed creatinine 1.59 (05/28/22), improved from 1.59  (05/26/22) and GFR 40 (05/28/22), improved from 38 (05/26/22). No reported fever. He was seen in the room today with daughter, [name], at bedside. He is verbal but noted to be confused. Diet was downgrade to mechanical soft with nectar thick liquid. Daughter stated that resident had neuroblastoma for which he had treatment years ago. Daughter requested for palliative consult.      Past Medical History:  Diagnosis Date   Allergic rhinitis    Amnesia    Atherosclerotic heart disease of native coronary artery without angina pectoris    BPH (benign prostatic hyperplasia)    Bradycardia    Chronic diastolic (congestive) heart failure (HCC)    Colon polyps    Coronary artery disease    MODERATE   Dizziness    Edema of lower extremity    Erectile dysfunction    H. pylori infection    Hx of    Hard of hearing    Headache(784.0)    History of colon polyps 2009   Dr. Orr/Colonoscopy -small cecal adenoma   History of falling    Hx of colonoscopy 2009   Dr Lajoyce Corners, hemorrhoids & polyps   Hyperlipidemia    Hypertension    Hypertensive heart disease with heart failure ([place])    Hypokalemia    Internal hemorrhoids 2009   Dr. Lajoyce Corners Colonoscopy   Metabolic encephalopathy    Mitral valve regurgitation    Muscle weakness (generalized)    Pneumonia    Primary generalized (osteo)arthritis    Reflux esophagitis    Sepsis ([place]) 11/04/2018  Unspecified hemorrhoids    Unsteadiness on feet    Vitamin D deficiency    Past Surgical History:  Procedure Laterality Date   APPENDECTOMY     CARDIAC CATHETERIZATION  1997   REVEALED MILD TO MODERATE IRREGULARITIES. HIS LEFT EVNTRICULAR SYSTOLIC FUNCTION REVEALED NORMAL EJECTION FRACTION WITH EF OF 70%   CATARACT EXTRACTION, BILATERAL     COLONOSCOPY  multiple   fatty tumor removal     benign   HEMORROIDECTOMY     HIP ARTHROPLASTY Left 08/17/2013   Procedure: ARTHROPLASTY BIPOLAR HIP;  Surgeon: Gearlean Alf, MD;  Location: [facility];  Service: Orthopedics;   Laterality: Left;   INGUINAL HERNIA REPAIR Bilateral 2006   lower back surgery  2006   NASAL SEPTUM SURGERY     TONSILLECTOMY     UPPER GASTROINTESTINAL ENDOSCOPY      No Known Allergies  Allergies as of 05/28/2022   No Known Allergies      Medication List        Accurate as of May 28, 2022  1:49 PM. If you have any questions, ask your nurse or doctor.          STOP taking these medications    DEXTRAN 70-HYPROMELLOSE OP Stopped by: Durenda Age, NP   torsemide 20 MG tablet Commonly known as: DEMADEX Stopped by: Durenda Age, NP       TAKE these medications    acetaminophen 500 MG tablet Commonly known as: TYLENOL Take 500 mg by mouth every 8 (eight) hours as needed.   amLODipine 5 MG tablet Commonly known as: NORVASC Take 5 mg by mouth daily.   ARTIFICIAL TEARS OP Apply 1 drop to eye as needed.   aspirin 325 MG tablet Take 325 mg by mouth daily.   cefTRIAXone 1 g injection Commonly known as: ROCEPHIN Inject 1 g into the muscle daily.   CeraVe Crea Apply topically at bedtime. To lower extremities @ bedtime   cyanocobalamin 1000 MCG tablet Commonly known as: VITAMIN B12 Take 1,000 mcg by mouth daily.   hydrocortisone 2.5 % rectal cream Commonly known as: ANUSOL-HC Place 1 application rectally 2 (two) times daily as needed for anal itching or hemorrhoids (USE PERINEAL APPLICATOR). With perineal applicator   levETIRAcetam 500 MG tablet Commonly known as: KEPPRA Take 500 mg by mouth 2 (two) times daily.   lisinopril 40 MG tablet Commonly known as: ZESTRIL Take 40 mg by mouth daily.   omeprazole 20 MG capsule Commonly known as: PRILOSEC Take 20 mg by mouth daily.   potassium chloride SA 20 MEQ tablet Commonly known as: KLOR-CON M Take 20 mEq by mouth daily.   rosuvastatin 5 MG tablet Commonly known as: CRESTOR Take 5 mg by mouth daily.   Thera-M Tabs Take 1 tablet by mouth daily.        Review of Systems   Unable to obtain due to confusion.  Immunization History  Administered Date(s) Administered   Influenza, High Dose Seasonal PF 03/19/2019, 03/19/2019   Influenza-Unspecified 04/08/2016, 03/27/2020, 04/03/2021   Moderna Sars-Covid-2 Vaccination 06/18/2019, 07/16/2019, 04/24/2020, 11/13/2020   Pfizer Covid-19 Vaccine Bivalent Booster 52yr & up 03/05/2021   Pneumococcal Conjugate-13 06/02/2013   Pneumococcal Polysaccharide-23 04/07/2001   Tetanus 08/14/2013   Zoster Recombinat (Shingrix) 08/03/2021   Pertinent  Health Maintenance Due  Topic Date Due   INFLUENZA VACCINE  01/14/2022      09/22/2019    7:30 AM 09/22/2019    9:00 PM 09/23/2019    9:30 AM 01/12/2020  3:02 AM 04/04/2022    9:23 AM  Fall Risk  Falls in the past year?     0  Was there an injury with Fall?     0  Fall Risk Category Calculator     0  Fall Risk Category     Low  Patient Fall Risk Level High fall risk High fall risk High fall risk High fall risk Low fall risk  Patient at Risk for Falls Due to     History of fall(s);Impaired balance/gait  Fall risk Follow up     Falls evaluation completed   Functional Status Survey:    Vitals:   05/28/22 1317  BP: (!) 156/85  Pulse: 62  Resp: 18  Temp: 97.9 F (36.6 C)  SpO2: 93%  Weight: 182 lb 9.6 oz (82.8 kg)  Height: _0  (1.702 m)   Body mass index is 28.6 kg/m. Physical Exam Constitutional:      General: He is not in acute distress.    Appearance: Normal appearance.  HENT:     Head: Normocephalic and atraumatic.     Mouth/Throat:     Mouth: Mucous membranes are moist.  Eyes:     Conjunctiva/sclera: Conjunctivae normal.  Cardiovascular:     Rate and Rhythm: Normal rate and regular rhythm.     Pulses: Normal pulses.     Heart sounds: Normal heart sounds.  Pulmonary:     Effort: Pulmonary effort is normal.     Breath sounds: Normal breath sounds.  Abdominal:     General: Bowel sounds are normal.     Palpations: Abdomen is soft.  Musculoskeletal:         General: No swelling.     Cervical back: Normal range of motion.  Skin:    General: Skin is warm and dry.  Neurological:     Mental Status: He is alert.     Motor: Weakness present.  Psychiatric:        Mood and Affect: Mood normal.        Behavior: Behavior normal.     Labs reviewed: Recent Labs    07/18/21 0000 10/15/21 0000 11/21/21 0000 05/26/22 0000  NA 141 143 143 140  K 4.3 5.1 4.1 4.4  CL 102 106 108 105  CO2 31* 24* 30* 25*  BUN 16 35* 22* 28*  CREATININE 1.0 1.3 1.1  --   CALCIUM 9.3 9.6 8.7  --    Recent Labs    07/18/21 0000 05/26/22 0000  AST 19 85*  ALT 17 96*  ALKPHOS 61  --   ALBUMIN 3.9 3.4*   Recent Labs    07/18/21 0000 12/19/21 0000 05/26/22 0000  WBC 7.4 8.2 34.0  NEUTROABS  --  4,674.00 31,790.00  HGB 14.1 12.5* 11.3*  HCT 41 36* 32*  PLT 223 218 172   Lab Results  Component Value Date   TSH 1.77 04/06/2020   No results found for: "HGBA1C" Lab Results  Component Value Date   CHOL 131 07/18/2021   HDL 57 07/18/2021   LDLCALC 61 07/18/2021   TRIG 57 07/18/2021   CHOLHDL 2.7 02/17/2017    Significant Diagnostic Results in last 30 days:  No results found.  Assessment/Plan  1. Neutrophilic leukocytosis -  awaiting result of urinalysis and culture -  continue Ceftriaxone IM  2. Oropharyngeal dysphagia -  continue mechanical soft diet with nectar thick liquid -  aspiration precautions  3. Generalized weakness -  palliative consult  -  continue supportive care  4. Acute renal injury ([place]) -  due to poor oral intake Lab Results  Component Value Date   NA 140 05/26/2022   K 4.4 05/26/2022   CO2 25 (A) 05/26/2022   GLUCOSE 131 (H) 01/12/2020   BUN 28 (A) 05/26/2022   CREATININE 1.1 11/21/2021   CALCIUM 8.7 11/21/2021   EGFR 62 11/21/2021   GFRNONAA 71 11/14/2020   -  completed IV fluid D5 1/2 NS X 1L   Family/ staff Communication:  Discussed plan of care with daughter and charge nurse.  Labs/tests  ordered:   CBC on 05/29/22 and CMP in 1 week

## 2022-05-29 ENCOUNTER — Encounter: Payer: Self-pay | Admitting: Nurse Practitioner

## 2022-05-29 ENCOUNTER — Non-Acute Institutional Stay (SKILLED_NURSING_FACILITY): Payer: Medicare HMO | Admitting: Nurse Practitioner

## 2022-05-29 DIAGNOSIS — D72825 Bandemia: Secondary | ICD-10-CM | POA: Diagnosis not present

## 2022-05-29 DIAGNOSIS — R41841 Cognitive communication deficit: Secondary | ICD-10-CM | POA: Diagnosis not present

## 2022-05-29 DIAGNOSIS — I2510 Atherosclerotic heart disease of native coronary artery without angina pectoris: Secondary | ICD-10-CM

## 2022-05-29 DIAGNOSIS — N3000 Acute cystitis without hematuria: Secondary | ICD-10-CM

## 2022-05-29 DIAGNOSIS — R748 Abnormal levels of other serum enzymes: Secondary | ICD-10-CM

## 2022-05-29 DIAGNOSIS — M5440 Lumbago with sciatica, unspecified side: Secondary | ICD-10-CM

## 2022-05-29 DIAGNOSIS — I872 Venous insufficiency (chronic) (peripheral): Secondary | ICD-10-CM | POA: Diagnosis not present

## 2022-05-29 DIAGNOSIS — R1312 Dysphagia, oropharyngeal phase: Secondary | ICD-10-CM

## 2022-05-29 DIAGNOSIS — Z8673 Personal history of transient ischemic attack (TIA), and cerebral infarction without residual deficits: Secondary | ICD-10-CM

## 2022-05-29 DIAGNOSIS — M25661 Stiffness of right knee, not elsewhere classified: Secondary | ICD-10-CM | POA: Diagnosis not present

## 2022-05-29 DIAGNOSIS — N179 Acute kidney failure, unspecified: Secondary | ICD-10-CM | POA: Diagnosis not present

## 2022-05-29 DIAGNOSIS — N401 Enlarged prostate with lower urinary tract symptoms: Secondary | ICD-10-CM | POA: Diagnosis not present

## 2022-05-29 DIAGNOSIS — I10 Essential (primary) hypertension: Secondary | ICD-10-CM | POA: Diagnosis not present

## 2022-05-29 DIAGNOSIS — M6281 Muscle weakness (generalized): Secondary | ICD-10-CM | POA: Diagnosis not present

## 2022-05-29 DIAGNOSIS — N138 Other obstructive and reflux uropathy: Secondary | ICD-10-CM

## 2022-05-29 DIAGNOSIS — E876 Hypokalemia: Secondary | ICD-10-CM | POA: Diagnosis not present

## 2022-05-29 DIAGNOSIS — M25662 Stiffness of left knee, not elsewhere classified: Secondary | ICD-10-CM | POA: Diagnosis not present

## 2022-05-29 DIAGNOSIS — K219 Gastro-esophageal reflux disease without esophagitis: Secondary | ICD-10-CM | POA: Diagnosis not present

## 2022-05-29 DIAGNOSIS — N1831 Chronic kidney disease, stage 3a: Secondary | ICD-10-CM | POA: Diagnosis not present

## 2022-05-29 DIAGNOSIS — I482 Chronic atrial fibrillation, unspecified: Secondary | ICD-10-CM | POA: Diagnosis not present

## 2022-05-29 DIAGNOSIS — I5032 Chronic diastolic (congestive) heart failure: Secondary | ICD-10-CM

## 2022-05-29 DIAGNOSIS — R569 Unspecified convulsions: Secondary | ICD-10-CM

## 2022-05-29 NOTE — Assessment & Plan Note (Signed)
takes Omeprazole 

## 2022-05-29 NOTE — Assessment & Plan Note (Signed)
resolved right hand numbness, weakness, difficulty feeding self, changed to ASA '325mg'$  qd from '81mg'$  11/13/20, on Statin, declined neurology consultation

## 2022-05-29 NOTE — Assessment & Plan Note (Signed)
s/p IVF 1098m D51/2NS trending down Bun/creat 40/1.59 05/28/22 from 28/1.64 05/26/22, pending CBC 05/29/22, scheduled CMP one week

## 2022-05-29 NOTE — Assessment & Plan Note (Signed)
takes Keppra, F/u neurology

## 2022-05-29 NOTE — Assessment & Plan Note (Signed)
saw urology. Chronic cystitis

## 2022-05-29 NOTE — Assessment & Plan Note (Signed)
blood pressure is controled on amlodipine, Lisinopril.

## 2022-05-29 NOTE — Assessment & Plan Note (Addendum)
Bed rest now, no c/o back pain.

## 2022-05-29 NOTE — Assessment & Plan Note (Signed)
EF 82-88%, grade 1 diastolic dysfunction, off Furosemide, saw Cardiology

## 2022-05-29 NOTE — Assessment & Plan Note (Signed)
on Crestor, ASA. LDL 61 07/18/21.  10/09/20 EKG SR vent rate 65, no significant ST-T changes.

## 2022-05-29 NOTE — Assessment & Plan Note (Addendum)
The patient's low grade fever-resolved, lethargy-improved, no further c/o lower abd discomfort. No focal neurological symptoms, negative COVID, treated with Rocephin since 05/26/22 for elevated wbc, trended down 18 05/28/22 from 34 05/26/22. Will extend Rocephin to total 10 days.  05/26/22 UA +nitrite, 3+ leukocyte esterase, many bacterials.  05/29/22 wbc 12.1, Hgb 12.2, plt 80, neutrophils 79.5

## 2022-05-29 NOTE — Assessment & Plan Note (Signed)
05/28/22 AST 120, ALT 145, ? 2/2 sepsis, repeat CMP one week, observe for GI symptoms.

## 2022-05-29 NOTE — Progress Notes (Addendum)
[place] Location:   SNF [facility] Room Number: 41 Place of Service:  SNF (31) Provider: [facility] Kegan Shepardson NP  Wardell Honour, MD  Patient Care Team: Wardell Honour, MD as PCP - General (Family Medicine) Gatha Mayer, MD as Consulting Physician (Gastroenterology) [name], [name], MD as Consulting Physician (Cardiology) [name], MD as Attending Physician (Internal Medicine) Beth Spackman X, NP as Nurse Practitioner (Internal Medicine)  Extended Emergency Contact Information Primary Emergency Contact: Lundeen,[name] Address: [place], [place] [address] [name] Phone: [phone] Mobile Phone: [phone] Relation: Daughter Secondary Emergency Contact: Nagy,[name] Address: [place] [address] [place] Phone: [phone] Mobile Phone: [phone] Relation: Son  Code Status:  DNR Goals of care: Advanced Directive information    05/28/2022    1:20 PM  Advanced Directives  Does Patient Have a Medical Advance Directive? Yes  Type of Paramedic of [place];Living will;Out of facility DNR (pink MOST or yellow form)  Does patient want to make changes to medical advance directive? No - Patient declined  Copy of [place] in Chart? Yes - validated most recent copy scanned in chart (See row information)  Pre-existing out of facility DNR order (yellow form or pink MOST form) Yellow form placed in chart (order not valid for inpatient use)     Chief Complaint  Patient presents with   Medical Management of Chronic Issues    HPI:  Pt is a 88 y.o. male seen today for medical management of chronic diseases.     The patient's low grade fever-resolved, lethargy-improved, no further c/o lower abd discomfort. No focal neurological symptoms, negative COVID, treated with Rocephin since 05/26/22 for elevated wbc, trended down 18  05/28/22 from 34 05/26/22,  s/p IVF 1093m D51/2NS trending down Bun/creat 40/1.59 05/28/22 from 28/1.64 05/26/22, pending CBC 05/29/22, scheduled CMP one week.   Dysphagia, nectar thick.              Chronic venous insuffiencey, Hx of ulcers of BLE, compression wrap,  f/u wound care center, treated for infected wounds/cellulitis in the past.              TIA, resolved right hand numbness, weakness, difficulty feeding self, changed to ASA '325mg'$  qd from '81mg'$  11/13/20, on Statin, declined neurology consultation.              Recurrent falls, due to unsteady on his legs, attempting standing up or transfer with no assistance.              BPH, saw urology. Chronic cystitis              PVD, BLE brownish erythematous pigmentation, no further swelling, off Furosemide, Hx of venous stasis ulcers.              Afib, onset 09/2019, heart rate is controlled, on ASA             CHF, EF 697-94% grade 1 diastolic dysfunction, off Furosemide, saw Cardiology             HTN, blood pressure is controled on amlodipine, Lisinopril.             Hx of CAD, hyperlipidemia, on Crestor, ASA. LDL 61 07/18/21.  10/09/20 EKG SR vent rate 65, no significant ST-T changes.  GERD, takes Omeprazole.              Insomnia, stable, off Trazodone.              Chronic lower back pain/right hip pain,  off Gabapentin             OA, s/p left hip surgery, chronic right hip pain, w/c helps, prn Tylenol             Seizure: takes Keppra, F/u neurology         Past Medical History:  Diagnosis Date   Allergic rhinitis    Amnesia    Atherosclerotic heart disease of native coronary artery without angina pectoris    BPH (benign prostatic hyperplasia)    Bradycardia    Chronic diastolic (congestive) heart failure (HCC)    Colon polyps    Coronary artery disease    MODERATE   Dizziness    Edema of lower extremity    Erectile dysfunction    H. pylori infection    Hx of    Hard of hearing    Headache(784.0)    History  of colon polyps 2009   Dr. Orr/Colonoscopy -small cecal adenoma   History of falling    Hx of colonoscopy 2009   Dr Lajoyce Corners, hemorrhoids & polyps   Hyperlipidemia    Hypertension    Hypertensive heart disease with heart failure ([place])    Hypokalemia    Internal hemorrhoids 2009   Dr. Lajoyce Corners Colonoscopy   Metabolic encephalopathy    Mitral valve regurgitation    Muscle weakness (generalized)    Pneumonia    Primary generalized (osteo)arthritis    Reflux esophagitis    Sepsis ([place]) 11/04/2018   Unspecified hemorrhoids    Unsteadiness on feet    Vitamin D deficiency    Past Surgical History:  Procedure Laterality Date   APPENDECTOMY     CARDIAC CATHETERIZATION  1997   REVEALED MILD TO MODERATE IRREGULARITIES. HIS LEFT EVNTRICULAR SYSTOLIC FUNCTION REVEALED NORMAL EJECTION FRACTION WITH EF OF 70%   CATARACT EXTRACTION, BILATERAL     COLONOSCOPY  multiple   fatty tumor removal     benign   HEMORROIDECTOMY     HIP ARTHROPLASTY Left 08/17/2013   Procedure: ARTHROPLASTY BIPOLAR HIP;  Surgeon: Gearlean Alf, MD;  Location: [facility];  Service: Orthopedics;  Laterality: Left;   INGUINAL HERNIA REPAIR Bilateral 2006   lower back surgery  2006   NASAL SEPTUM SURGERY     TONSILLECTOMY     UPPER GASTROINTESTINAL ENDOSCOPY      No Known Allergies  Allergies as of 05/29/2022   No Known Allergies      Medication List        Accurate as of May 29, 2022 11:59 PM. If you have any questions, ask your nurse or doctor.          acetaminophen 500 MG tablet Commonly known as: TYLENOL Take 500 mg by mouth every 8 (eight) hours as needed.   amLODipine 5 MG tablet Commonly known as: NORVASC Take 5 mg by mouth daily.   ARTIFICIAL TEARS OP Apply 1 drop to eye as needed.   aspirin 325 MG tablet Take 325 mg by mouth daily.   cefTRIAXone 1 g injection Commonly known as: ROCEPHIN Inject 1 g into the muscle daily.   CeraVe Crea Apply topically at bedtime. To lower extremities @  bedtime   cyanocobalamin 1000 MCG tablet Commonly known as: VITAMIN B12 Take 1,000 mcg  by mouth daily.   hydrocortisone 2.5 % rectal cream Commonly known as: ANUSOL-HC Place 1 application rectally 2 (two) times daily as needed for anal itching or hemorrhoids (USE PERINEAL APPLICATOR). With perineal applicator   levETIRAcetam 500 MG tablet Commonly known as: KEPPRA Take 500 mg by mouth 2 (two) times daily.   lisinopril 40 MG tablet Commonly known as: ZESTRIL Take 40 mg by mouth daily.   omeprazole 20 MG capsule Commonly known as: PRILOSEC Take 20 mg by mouth daily.   potassium chloride SA 20 MEQ tablet Commonly known as: KLOR-CON M Take 20 mEq by mouth daily.   rosuvastatin 5 MG tablet Commonly known as: CRESTOR Take 5 mg by mouth daily.   Thera-M Tabs Take 1 tablet by mouth daily.        Review of Systems  Constitutional:  Positive for activity change, appetite change and fatigue. Negative for fever.  HENT:  Positive for hearing loss. Negative for congestion and voice change.   Eyes:  Negative for visual disturbance.  Respiratory:  Negative for cough, shortness of breath and wheezing.   Cardiovascular:  Negative for leg swelling.  Gastrointestinal:  Negative for constipation.       ? Suprapubic area discomfort.   Genitourinary:  Positive for frequency. Negative for difficulty urinating and urgency.  Musculoskeletal:  Positive for arthralgias, back pain and gait problem.       Lower back, right hip/thigh pain   Skin:  Negative for color change.       Chronic venous insufficiency skin changes BLE, venous ulcers BLE covered in dressing  Neurological:  Negative for seizures, weakness and headaches.       Memory lapses. Tingling in toes. Right hand weakness/numbness is improved, placed ASA 325 11/13/20  Psychiatric/Behavioral:  Negative for behavioral problems and sleep disturbance. The patient is not nervous/anxious.     Immunization History  Administered Date(s)  Administered   Influenza, High Dose Seasonal PF 03/19/2019, 03/19/2019   Influenza-Unspecified 04/08/2016, 03/27/2020, 04/03/2021   Moderna Sars-Covid-2 Vaccination 06/18/2019, 07/16/2019, 04/24/2020, 11/13/2020   Pfizer Covid-19 Vaccine Bivalent Booster 56yr & up 03/05/2021   Pneumococcal Conjugate-13 06/02/2013   Pneumococcal Polysaccharide-23 04/07/2001   Tetanus 08/14/2013   Zoster Recombinat (Shingrix) 08/03/2021   Pertinent  Health Maintenance Due  Topic Date Due   INFLUENZA VACCINE  01/14/2022      09/22/2019    7:30 AM 09/22/2019    9:00 PM 09/23/2019    9:30 AM 01/12/2020    3:02 AM 04/04/2022    9:23 AM  Fall Risk  Falls in the past year?     0  Was there an injury with Fall?     0  Fall Risk Category Calculator     0  Fall Risk Category     Low  Patient Fall Risk Level High fall risk High fall risk High fall risk High fall risk Low fall risk  Patient at Risk for Falls Due to     History of fall(s);Impaired balance/gait  Fall risk Follow up     Falls evaluation completed   Functional Status Survey:    Vitals:   05/29/22 1117  BP: (!) 140/60  Pulse: 78  Resp: 20  Temp: 97.9 F (36.6 C)  SpO2: 93%  Weight: 183 lb (83 kg)   Body mass index is 28.66 kg/m. Physical Exam Vitals and nursing note reviewed.  Constitutional:      Comments: Lethargic, able to be aroused when spoke to, followed simple directions and  answered questions.   HENT:     Head: Normocephalic and atraumatic.     Mouth/Throat:     Mouth: Mucous membranes are moist.  Eyes:     Conjunctiva/sclera: Conjunctivae normal.     Pupils: Pupils are equal, round, and reactive to light.  Cardiovascular:     Rate and Rhythm: Normal rate and regular rhythm.     Heart sounds: Murmur heard.  Pulmonary:     Breath sounds: No rales.  Abdominal:     General: Bowel sounds are normal.     Palpations: Abdomen is soft.     Tenderness: There is no guarding.     Comments: ? Suprapubic area  discomfort/tenderness.   Genitourinary:    Comments: External hemorrhoids, no bleeding or injury.  Musculoskeletal:     Cervical back: Normal range of motion and neck supple. No muscular tenderness.     Right lower leg: No edema.     Left lower leg: No edema.     Comments: Bed rest now  Skin:    General: Skin is warm and dry.     Comments: Chronic venous insufficiency skin changes BLE, venous stasis ulcers BLE covered in dressing.   Neurological:     General: No focal deficit present.     Mental Status: He is alert. Mental status is at baseline.     Motor: No weakness.     Coordination: Coordination normal.     Gait: Gait abnormal.     Comments: Oriented to person  Psychiatric:        Mood and Affect: Mood normal.        Behavior: Behavior normal.     Labs reviewed: Recent Labs    07/18/21 0000 10/15/21 0000 11/21/21 0000 05/26/22 0000  NA 141 143 143 140  K 4.3 5.1 4.1 4.4  CL 102 106 108 105  CO2 31* 24* 30* 25*  BUN 16 35* 22* 28*  CREATININE 1.0 1.3 1.1  --   CALCIUM 9.3 9.6 8.7  --    Recent Labs    07/18/21 0000 05/26/22 0000  AST 19 85*  ALT 17 96*  ALKPHOS 61  --   ALBUMIN 3.9 3.4*   Recent Labs    07/18/21 0000 12/19/21 0000 05/26/22 0000  WBC 7.4 8.2 34.0  NEUTROABS  --  4,674.00 31,790.00  HGB 14.1 12.5* 11.3*  HCT 41 36* 32*  PLT 223 218 172   Lab Results  Component Value Date   TSH 1.77 04/06/2020   No results found for: "HGBA1C" Lab Results  Component Value Date   CHOL 131 07/18/2021   HDL 57 07/18/2021   LDLCALC 61 07/18/2021   TRIG 57 07/18/2021   CHOLHDL 2.7 02/17/2017    Significant Diagnostic Results in last 30 days:  No results found.  Assessment/Plan  Leukocytosis The patient's low grade fever-resolved, lethargy-improved, no further c/o lower abd discomfort. No focal neurological symptoms, negative COVID, treated with Rocephin since 05/26/22 for elevated wbc, trended down 18 05/28/22 from 34 05/26/22. Will extend  Rocephin to total 10 days.  05/26/22 UA +nitrite, 3+ leukocyte esterase, many bacterials.  05/29/22 wbc 12.1, Hgb 12.2, plt 80, neutrophils 79.5  Acute renal injury ([place]) s/p IVF 1054m D51/2NS trending down Bun/creat 40/1.59 05/28/22 from 28/1.64 05/26/22, pending CBC 05/29/22, scheduled CMP one week  Dysphagia, oropharyngeal phase  nectar thick.   Venous insufficiency (chronic) (peripheral) Hx of ulcers of BLE, compression wrap,  f/u wound care center, treated for infected wounds/cellulitis  in the past. No further swelling, off Furosemide  History of TIA (transient ischemic attack) resolved right hand numbness, weakness, difficulty feeding self, changed to ASA '325mg'$  qd from '81mg'$  11/13/20, on Statin, declined neurology consultation  BPH (benign prostatic hyperplasia) saw urology. Chronic cystitis  Chronic atrial fibrillation ([place]) onset 09/2019, heart rate is controlled, on ASA  Chronic diastolic heart failure (HCC) EF 45-62%, grade 1 diastolic dysfunction, off Furosemide, saw Cardiology  Hypertension blood pressure is controled on amlodipine, Lisinopril.  CAD (coronary artery disease) on Crestor, ASA. LDL 61 07/18/21.  10/09/20 EKG SR vent rate 65, no significant ST-T changes.   GERD (gastroesophageal reflux disease)  takes Omeprazole.   Lower back pain Bed rest now, no c/o back pain.   Seizure ([place]) takes Keppra, F/u neurology   Elevated liver enzymes 05/28/22 AST 120, ALT 145, ? 2/2 sepsis, repeat CMP one week, observe for GI symptoms.   UTI (urinary tract infection) 05/26/22 urine culture Proteus Mirabilis >100,000c/ml, susceptible to Ceftriaxone, will complete total 10 day course of Ceftriaxone.    Family/ staff Communication: plan of care reviewed with the patient and charge nurse.   Labs/tests ordered: CBC 05/29/22, CMP one week  Time spend 35 minutes.

## 2022-05-29 NOTE — Assessment & Plan Note (Signed)
Hx of ulcers of BLE, compression wrap,  f/u wound care center, treated for infected wounds/cellulitis in the past. No further swelling, off Furosemide

## 2022-05-29 NOTE — Assessment & Plan Note (Signed)
nectar thick.

## 2022-05-29 NOTE — Assessment & Plan Note (Signed)
onset 09/2019, heart rate is controlled, on ASA

## 2022-05-30 NOTE — Assessment & Plan Note (Signed)
05/26/22 urine culture Proteus Mirabilis >100,000c/ml, susceptible to Ceftriaxone, will complete total 10 day course of Ceftriaxone.

## 2022-06-01 DIAGNOSIS — R1312 Dysphagia, oropharyngeal phase: Secondary | ICD-10-CM | POA: Diagnosis not present

## 2022-06-01 DIAGNOSIS — M6281 Muscle weakness (generalized): Secondary | ICD-10-CM | POA: Diagnosis not present

## 2022-06-01 DIAGNOSIS — M25661 Stiffness of right knee, not elsewhere classified: Secondary | ICD-10-CM | POA: Diagnosis not present

## 2022-06-01 DIAGNOSIS — R41841 Cognitive communication deficit: Secondary | ICD-10-CM | POA: Diagnosis not present

## 2022-06-01 DIAGNOSIS — M25662 Stiffness of left knee, not elsewhere classified: Secondary | ICD-10-CM | POA: Diagnosis not present

## 2022-06-02 DIAGNOSIS — M6281 Muscle weakness (generalized): Secondary | ICD-10-CM | POA: Diagnosis not present

## 2022-06-02 DIAGNOSIS — M25661 Stiffness of right knee, not elsewhere classified: Secondary | ICD-10-CM | POA: Diagnosis not present

## 2022-06-02 DIAGNOSIS — M25662 Stiffness of left knee, not elsewhere classified: Secondary | ICD-10-CM | POA: Diagnosis not present

## 2022-06-02 DIAGNOSIS — R1312 Dysphagia, oropharyngeal phase: Secondary | ICD-10-CM | POA: Diagnosis not present

## 2022-06-02 DIAGNOSIS — R41841 Cognitive communication deficit: Secondary | ICD-10-CM | POA: Diagnosis not present

## 2022-06-03 DIAGNOSIS — M25662 Stiffness of left knee, not elsewhere classified: Secondary | ICD-10-CM | POA: Diagnosis not present

## 2022-06-03 DIAGNOSIS — R41841 Cognitive communication deficit: Secondary | ICD-10-CM | POA: Diagnosis not present

## 2022-06-03 DIAGNOSIS — M25661 Stiffness of right knee, not elsewhere classified: Secondary | ICD-10-CM | POA: Diagnosis not present

## 2022-06-03 DIAGNOSIS — M6281 Muscle weakness (generalized): Secondary | ICD-10-CM | POA: Diagnosis not present

## 2022-06-03 DIAGNOSIS — R1312 Dysphagia, oropharyngeal phase: Secondary | ICD-10-CM | POA: Diagnosis not present

## 2022-06-04 ENCOUNTER — Non-Acute Institutional Stay: Payer: Self-pay | Admitting: Family Medicine

## 2022-06-04 ENCOUNTER — Encounter: Payer: Self-pay | Admitting: Family Medicine

## 2022-06-04 ENCOUNTER — Telehealth: Payer: Self-pay | Admitting: Family Medicine

## 2022-06-04 VITALS — BP 156/78 | HR 79 | Temp 99.1°F | Resp 18

## 2022-06-04 DIAGNOSIS — N3000 Acute cystitis without hematuria: Secondary | ICD-10-CM

## 2022-06-04 DIAGNOSIS — R41841 Cognitive communication deficit: Secondary | ICD-10-CM | POA: Diagnosis not present

## 2022-06-04 DIAGNOSIS — E876 Hypokalemia: Secondary | ICD-10-CM | POA: Diagnosis not present

## 2022-06-04 DIAGNOSIS — I10 Essential (primary) hypertension: Secondary | ICD-10-CM | POA: Diagnosis not present

## 2022-06-04 DIAGNOSIS — M6281 Muscle weakness (generalized): Secondary | ICD-10-CM | POA: Diagnosis not present

## 2022-06-04 DIAGNOSIS — R1312 Dysphagia, oropharyngeal phase: Secondary | ICD-10-CM | POA: Diagnosis not present

## 2022-06-04 DIAGNOSIS — Z515 Encounter for palliative care: Secondary | ICD-10-CM | POA: Diagnosis not present

## 2022-06-04 DIAGNOSIS — N1831 Chronic kidney disease, stage 3a: Secondary | ICD-10-CM | POA: Diagnosis not present

## 2022-06-04 DIAGNOSIS — M25662 Stiffness of left knee, not elsewhere classified: Secondary | ICD-10-CM | POA: Diagnosis not present

## 2022-06-04 DIAGNOSIS — R269 Unspecified abnormalities of gait and mobility: Secondary | ICD-10-CM

## 2022-06-04 DIAGNOSIS — M25661 Stiffness of right knee, not elsewhere classified: Secondary | ICD-10-CM | POA: Diagnosis not present

## 2022-06-04 NOTE — Progress Notes (Signed)
[facility] Palliative Care Consult Note Telephone: [phone]  Fax: [phone]   Date of encounter: 06/04/22 11:16 AM PATIENT NAME: [name] [name] [address] [place] [id number]   [phone] (home)  DOB: [birth year] MRN: [id number] PRIMARY CARE PROVIDER:    Wardell Honour, MD,  [place] [address] [phone]  REFERRING PROVIDER:   Wardell Honour, MD [address] [place],  [place] [address] [phone]  RESPONSIBLE PARTY:    Contact Information     Name Relation Home Work Mobile   Dunlop,[name] Daughter [phone]  [phone]   Latoya, Diskin [phone]  [phone]   Fetterolf,[name] Daughter   [phone]        I met face to face with patient in [facility]. Palliative Care was asked to follow this patient by consultation request of  Wardell Honour, MD to address advance care planning and complex medical decision making. This is the initial visit.          ASSESSMENT, SYMPTOM MANAGEMENT AND PLAN / RECOMMENDATIONS:   UTI- Positive urine culture at facility with Proteus mirabilis.  Given WBC at 34, fever, elevated LFTs question possible sepsis.  On day 7 of 10 of Rocephin 1 gm IM. Culture with sensitivity to Ceftriaxone Continued low grade fever with improvement noted in lab functions. Also has some noted dysphagia, question possible alternative source of infection (possible aspiration)  2.  Gait abnormality with multiple falls Had been ambulatory 2 weeks prior. Poor intake-question illness versus progression of disease/new infection  3.  Palliative Care by Specialist Left message for pt's daughter to discuss goals of care-may be hospice appropriate   Follow up Palliative Care Visit: Palliative care will continue to follow for complex medical decision making, advance care planning, and clarification of goals. Return 2 weeks or prn.    This visit  was coded based on medical decision making (MDM).  PPS: 30%  HOSPICE ELIGIBILITY/DIAGNOSIS: May be appropriate depending on goals of care per daughter and response to current IM Rocephin therapy.  Chief Complaint:  [facility] received a referral to follow up with patient for chronic disease management with persistent atrial fibrillation in setting of chronic diastolic heart failure and recent onset leukocytosis. Palliative is also follow up to assist with advance directive planning and defining/refining goals of care.  HISTORY OF PRESENT ILLNESS:  [name] [name] is a 88 y.o. year old male with chroinic diastolic heart failure, paroxysmal atrial fibrillation and recent onset of leukocytosis.  Pt is being treated with a total of 10 days of Rocephin 1 gm IM.  He also has chronic venous insufficiency ulcers of BLE wrapped in Coban.  He has CAD, HTN, bradycardia, bleeding internal hemorrhoids, oropharyhgeal dysphagia, GERD, RLE and hand weakness, OA with hx of left femur fracture, BPH, HLD, recurrent falls due to gait abnormality, hx of complex partial seizures, Elevated LFTs and mitral regurgitation.  Notes indicate that he is incontinent of bowel and bladder.  He is on a no added salt mechanical soft with nectar thick liquids diet.  He is non-ambulatory, uses a wc some. Aerobic and anaerobic wound cultures done in 02/05/22 grew moderate MRSA, Enterococcus faecalis and small growth Proteus mirabilis.  Pt was treated with a 14 day course of Linezolid for that infection. Facility staff states prior to the last 2 weeks he was ambulatory to the dining room until his "most recent incident a week ago Monday".  On 05/26/22 an acute visit was made by facility provider for fever, lethargy and lower abd discomfort. Covid test was negative.  Per facility staff pt is on day 7 of 10 of Rocephin.  Has hx of chronic cystitis.  WBC peaked at 34.  Daughter advised facility provider that pt has hx  of neuroblastoma with treatment "years ago". He was noted to be treated and rehydrated with IV fluids. Repeat CBC was ordered for 05/29/22 and CMP in 1 week (no results noted yet for CMP). 05/26/22 UA +nitrite, 3+ leukocyte esterase, many bacteria with urine culture showing > 100,000 Proteus mirabilis, susceptible to Ceftriaxone so treatment was extended to total of 10 days..05/29/22 wbc 12.1, Hgb 12.2, plt 80, neutrophils 79.  05/28/22 increase in LFTs with AST 120, ALT 145 with requested repeat CMP in 1 week. Pt having documented trouble feeding himself.  History obtained from review of EMR, discussion with primary team, and interview with family, facility staff/caregiver and/or Mr. Swatzell.    07/18/21 0000 10/15/21 0000 11/21/21 0000 05/26/22 0000  NA 141 143 143 140  K 4.3 5.1 4.1 4.4  CL 102 106 108 105  CO2 31* 24* 30* 25*  BUN 16 35* 22* 28*  CREATININE 1.0 1.3 1.1  --   CALCIUM 9.3 9.6 8.7  --     07/18/21 0000 05/26/22 0000   AST 19 85*  ALT 17 96*  ALKPHOS 61  --   ALBUMIN 3.9 3.4*   Recent Labs    07/18/21 0000 12/19/21 0000 05/26/22 0000  WBC 7.4 8.2 34.0  NEUTROABS  --  4,674.00 31,790.00  HGB 14.1 12.5* 11.3*  HCT 41 36* 32*  PLT 223 218 172   I reviewed EMR for available labs, medications, imaging, studies and related documents.  Records reviewed and summarized above.   ROS Non-verbal, occasionally will nod head slightly yes or no  Physical Exam: Current and past weights: 182.6 lbs on 05/16/22 Constitutional: NAD General: frail, chronically ill appearing ENMT: Hard of hearing-wears hearing aids, oral mucous membranes dry CV: S1S2, RRR with audible murmur auscultated most audible on right sternal heart border, 1+ LE edema, BLE wrapped toes to knees with dressing covered in Coban, CDI Pulmonary: CTAB, no increased work of breathing, no cough, room air Abdomen: normo-active BS + 4 quadrants, soft and non tender, no ascites GU: deferred MSK: no  sarcopenia, moves upper extremities, bedbound on pressure reduction surface at present Skin: warm and dry, unable to visualize BLE wounds, toes are dry with thick yellow tinted plaques of flaky skin Neuro:  noted generalized weakness, no cognit Psych: non-anxious affect, A and O x 3 Hem/lymph/immuno: no widespread bruising  CURRENT PROBLEM LIST:  Patient Active Problem List   Diagnosis Date Noted   Dysphagia, oropharyngeal phase 05/29/2022   Leukocytosis 05/27/2022   Elevated liver enzymes 05/27/2022   Lethargy 05/26/2022   Conjunctivitis 05/20/2022   History of TIA (transient ischemic attack) 02/04/2021   Right hand weakness 11/13/2020   Osteoarthritis, multiple sites 05/09/2020   Debility 04/06/2020   Venous insufficiency (chronic) (peripheral) 04/06/2020   Complex partial seizure with impairment of consciousness at onset [facility]) 11/02/2019   Insomnia 09/28/2019   GERD (gastroesophageal reflux disease) 09/26/2019   Meningioma ([facility]) 09/26/2019   Seizure ([facility]) 09/19/2019   Chronic atrial fibrillation ([facility]) 09/19/2019   UTI (urinary tract infection) 09/19/2019   Acute renal injury ([facility]) 09/19/2019   BPH (benign prostatic hyperplasia) 03/23/2019   Gait abnormality 02/28/2019   Recurrent falls 02/22/2019  Anemia 02/03/2019   Lower back pain 12/10/2018   Right hip pain 11/26/2018   Palliative care by specialist    Right leg weakness 11/03/2017   Bleeding internal hemorrhoids 10/28/2017   Chronic diastolic heart failure ([facility]) 08/17/2013   Femur fracture, left (HCC) 08/16/2013   Bradycardia, severe sinus 10/30/2011   Personal history of colonic adenoma 01/09/2011   Family history of malignant neoplasm of colon cancer-mother 01/09/2011   CAD (coronary artery disease)    Hyperlipidemia    Hypertension    PAST MEDICAL HISTORY:  Active Ambulatory Problems    Diagnosis Date Noted   CAD (coronary artery disease)    Hyperlipidemia    Hypertension    Personal history of colonic  adenoma 01/09/2011   Family history of malignant neoplasm of colon cancer-mother 01/09/2011   Bradycardia, severe sinus 10/30/2011   Femur fracture, left ([facility]) 08/16/2013   Chronic diastolic heart failure ([facility]) 08/17/2013   Bleeding internal hemorrhoids 10/28/2017   Right leg weakness 11/03/2017   Palliative care by specialist    Right hip pain 11/26/2018   Lower back pain 12/10/2018   Anemia 02/03/2019   Recurrent falls 02/22/2019   Gait abnormality 02/28/2019   BPH (benign prostatic hyperplasia) 03/23/2019   Seizure ([facility]) 09/19/2019   Chronic atrial fibrillation ([facility]) 09/19/2019   UTI (urinary tract infection) 09/19/2019   Acute renal injury ([facility]) 09/19/2019   GERD (gastroesophageal reflux disease) 09/26/2019   Meningioma ([facility]) 09/26/2019   Insomnia 09/28/2019   Complex partial seizure with impairment of consciousness at onset ([facility]) 11/02/2019   Debility 04/06/2020   Venous insufficiency (chronic) (peripheral) 04/06/2020   Osteoarthritis, multiple sites 05/09/2020   Right hand weakness 11/13/2020   History of TIA (transient ischemic attack) 02/04/2021   Conjunctivitis 05/20/2022   Lethargy 05/26/2022   Leukocytosis 05/27/2022   Elevated liver enzymes 05/27/2022   Dysphagia, oropharyngeal phase 05/29/2022   Resolved Ambulatory Problems    Diagnosis Date Noted   Headache(784.0)    Multifocal pneumonia 11/01/2018   Sepsis ([facility]) 12/07/7626   Acute metabolic encephalopathy 31/51/7616   Hypokalemia 11/02/2018   Goals of care, counseling/discussion    Generalized anxiety disorder 01/26/2019   Edema 02/22/2019   CHF (congestive heart failure) ([facility]) 09/26/2019   Candidiasis 04/06/2020   Cellulitis of left lower leg 10/12/2020   Past Medical History:  Diagnosis Date   Allergic rhinitis    Amnesia    Atherosclerotic heart disease of native coronary artery without angina pectoris    Bradycardia    Chronic diastolic (congestive) heart failure ([facility])    Colon polyps     Coronary artery disease    Dizziness    Edema of lower extremity    Erectile dysfunction    H. pylori infection    Hard of hearing    History of colon polyps 2009   History of falling    Hx of colonoscopy 2009   Hypertensive heart disease with heart failure ([facility])    Internal hemorrhoids 0737   Metabolic encephalopathy    Mitral valve regurgitation    Muscle weakness (generalized)    Pneumonia    Primary generalized (osteo)arthritis    Reflux esophagitis    Unspecified hemorrhoids    Unsteadiness on feet    Vitamin D deficiency    SOCIAL HX:  Social History   Tobacco Use   Smoking status: Former    Types: Cigarettes    Quit date: 10/25/1975    Years since quitting: 46.6   Smokeless tobacco: Never  Substance Use Topics   Alcohol use: Yes    Comment: occ 6-7 times a year   FAMILY HX:  Family History  Problem Relation Age of Onset   Colon cancer Mother 34       second cancer in 41's deceased after emergency surgery   Breast cancer Sister    Seizures Neg Hx        Preferred Pharmacy: ALLERGIES: No Known Allergies   PERTINENT MEDICATIONS:  Outpatient Encounter Medications as of 06/04/2022  Medication Sig   acetaminophen (TYLENOL) 500 MG tablet Take 500 mg by mouth every 8 (eight) hours as needed.   amLODipine (NORVASC) 5 MG tablet Take 5 mg by mouth daily.   aspirin 325 MG tablet Take 325 mg by mouth daily.   Carboxymethylcellulose Sodium (ARTIFICIAL TEARS OP) Apply 1 drop to eye as needed.   cefTRIAXone (ROCEPHIN) 1 g injection Inject 1 g into the muscle daily.   Emollient (CERAVE) CREA Apply topically at bedtime. To lower extremities @ bedtime   hydrocortisone (ANUSOL-HC) 2.5 % rectal cream Place 1 application rectally 2 (two) times daily as needed for anal itching or hemorrhoids (USE PERINEAL APPLICATOR). With perineal applicator   levETIRAcetam (KEPPRA) 500 MG tablet Take 500 mg by mouth 2 (two) times daily.   lisinopril (ZESTRIL) 40 MG tablet Take 40 mg by mouth  daily.   Multiple Vitamins-Minerals (THERA-M) TABS Take 1 tablet by mouth daily.   omeprazole (PRILOSEC) 20 MG capsule Take 20 mg by mouth daily.   potassium chloride SA (KLOR-CON) 20 MEQ tablet Take 20 mEq by mouth daily.   rosuvastatin (CRESTOR) 5 MG tablet Take 5 mg by mouth daily.    vitamin B-12 (CYANOCOBALAMIN) 1000 MCG tablet Take 1,000 mcg by mouth daily.   No facility-administered encounter medications on file as of 06/04/2022.     ------------------------------------------------------------------------------------------ Advance Care Planning/Goals of Care: Goals include to maximize quality of life and symptom management. Identification  of a healthcare agent-HC POA daughter Zeyad Delaguila Review of an advance directive document-DNR and advance directive. Decision not to resuscitate or to de-escalate disease focused treatments due to poor prognosis. CODE STATUS: DNR    Thank you for the opportunity to participate in the care of Mr. Mondry.  The palliative care team will continue to follow. Please call our office at [phone] if we can be of additional assistance.   Marijo Conception, FNP-C  COVID-19 PATIENT SCREENING TOOL Asked and negative response unless otherwise noted:  Have you had symptoms of covid, tested positive or been in contact with someone with symptoms/positive test in the past 5-10 days?

## 2022-06-04 NOTE — Telephone Encounter (Signed)
06/04/22 10:19 am Daughter had requested phone call with new referral for Palliative on father. Left message that I was seeing pt today and would follow up with her after. Damaris Hippo FNP-C

## 2022-06-05 ENCOUNTER — Non-Acute Institutional Stay (SKILLED_NURSING_FACILITY): Payer: Medicare HMO | Admitting: Nurse Practitioner

## 2022-06-05 ENCOUNTER — Encounter: Payer: Self-pay | Admitting: Nurse Practitioner

## 2022-06-05 DIAGNOSIS — M25661 Stiffness of right knee, not elsewhere classified: Secondary | ICD-10-CM | POA: Diagnosis not present

## 2022-06-05 DIAGNOSIS — L8996 Pressure-induced deep tissue damage of unspecified site: Secondary | ICD-10-CM | POA: Diagnosis not present

## 2022-06-05 DIAGNOSIS — Z8673 Personal history of transient ischemic attack (TIA), and cerebral infarction without residual deficits: Secondary | ICD-10-CM

## 2022-06-05 DIAGNOSIS — I872 Venous insufficiency (chronic) (peripheral): Secondary | ICD-10-CM

## 2022-06-05 DIAGNOSIS — M6281 Muscle weakness (generalized): Secondary | ICD-10-CM | POA: Diagnosis not present

## 2022-06-05 DIAGNOSIS — E870 Hyperosmolality and hypernatremia: Secondary | ICD-10-CM

## 2022-06-05 DIAGNOSIS — R627 Adult failure to thrive: Secondary | ICD-10-CM

## 2022-06-05 DIAGNOSIS — I5032 Chronic diastolic (congestive) heart failure: Secondary | ICD-10-CM | POA: Diagnosis not present

## 2022-06-05 DIAGNOSIS — N401 Enlarged prostate with lower urinary tract symptoms: Secondary | ICD-10-CM | POA: Diagnosis not present

## 2022-06-05 DIAGNOSIS — R1312 Dysphagia, oropharyngeal phase: Secondary | ICD-10-CM

## 2022-06-05 DIAGNOSIS — R569 Unspecified convulsions: Secondary | ICD-10-CM | POA: Diagnosis not present

## 2022-06-05 DIAGNOSIS — N138 Other obstructive and reflux uropathy: Secondary | ICD-10-CM

## 2022-06-05 DIAGNOSIS — K219 Gastro-esophageal reflux disease without esophagitis: Secondary | ICD-10-CM

## 2022-06-05 DIAGNOSIS — I10 Essential (primary) hypertension: Secondary | ICD-10-CM | POA: Diagnosis not present

## 2022-06-05 DIAGNOSIS — I482 Chronic atrial fibrillation, unspecified: Secondary | ICD-10-CM | POA: Diagnosis not present

## 2022-06-05 DIAGNOSIS — N3000 Acute cystitis without hematuria: Secondary | ICD-10-CM | POA: Diagnosis not present

## 2022-06-05 DIAGNOSIS — R41841 Cognitive communication deficit: Secondary | ICD-10-CM | POA: Diagnosis not present

## 2022-06-05 DIAGNOSIS — M25662 Stiffness of left knee, not elsewhere classified: Secondary | ICD-10-CM | POA: Diagnosis not present

## 2022-06-05 NOTE — Assessment & Plan Note (Signed)
onset 09/2019, heart rate is controlled, on ASA

## 2022-06-05 NOTE — Assessment & Plan Note (Signed)
saw urology. Chronic cystitis

## 2022-06-05 NOTE — Assessment & Plan Note (Signed)
nectar thick.

## 2022-06-05 NOTE — Assessment & Plan Note (Signed)
R foot/ankle: lateral heel, lateral mid foot, medial malleolus. L foot/ankle: lateral/medial heel, medial malleolus.  Apply hydrocolloid dressing right lateral mid foot, float feet/ankles.

## 2022-06-05 NOTE — Assessment & Plan Note (Signed)
Continue palliative approach, multiple factorial.

## 2022-06-05 NOTE — Assessment & Plan Note (Signed)
Continue blood pressure control with amlodipine, Lisinopril.

## 2022-06-05 NOTE — Progress Notes (Signed)
Location:  [facility] Room Number: NO/61/A Place of Service:  SNF (31) Provider:  Shahiem Bedwell X, NP  Patient Care Team: Wardell Honour, MD as PCP - General (Family Medicine) Gatha Mayer, MD as Consulting Physician (Gastroenterology) [name], [name], MD as Consulting Physician (Cardiology) [name], MD as Attending Physician (Internal Medicine) Nichola Warren X, NP as Nurse Practitioner (Internal Medicine)  Extended Emergency Contact Information Primary Emergency Contact: Vanhise,[name] Address: [place], [place] [address] [name] Phone: [phone] Mobile Phone: [phone] Relation: Daughter Secondary Emergency Contact: Dietze,[name] Address: [place] [address] [place] Phone: [phone] Mobile Phone: [phone] Relation: Son  Code Status:  DNR Goals of care: Advanced Directive information    05/28/2022    1:20 PM  Advanced Directives  Does Patient Have a Medical Advance Directive? Yes  Type of Paramedic of [place];Living will;Out of facility DNR (pink MOST or yellow form)  Does patient want to make changes to medical advance directive? No - Patient declined  Copy of [place] in Chart? Yes - validated most recent copy scanned in chart (See row information)  Pre-existing out of facility DNR order (yellow form or pink MOST form) Yellow form placed in chart (order not valid for inpatient use)     Chief Complaint  Patient presents with   Acute Visit    Patient is being seen for pressure ulcer right and left foot plus ankles    HPI:  Pt is a 88 y.o. male seen today for an acute visit for hypernatremia, pressure ulcers R+L foot/ankles.   Na 161, K 3.6, Bun 24, creat 0.96 06/04/22, started 1/2 NS 1070m, infiltrated about about 5066minfused  Pressure ulcers, R foot/ankle: lateral heel,  lateral mid foot, medial malleolus. L foot/ankle: lateral/medial heel, medial malleolus.   UTI fully treated with 10 day course of Rocephin.   Dysphagia, nectar thick.              TIA, resolved right hand numbness, weakness, difficulty feeding self, changed to ASA '325mg'$  qd from '81mg'$  11/13/20, on Statin, declined neurology consultation.              Recurrent falls, due to unsteady on his legs, attempting standing up or transfer with no assistance.              BPH, saw urology. Chronic cystitis              PVD, BLE brownish erythematous pigmentation, no further swelling, off Furosemide, Hx of venous stasis ulcers.              Afib, onset 09/2019, heart rate is controlled, on ASA             CHF, EF 6537-34%grade 1 diastolic dysfunction, off Furosemide, saw Cardiology             HTN, blood pressure is controled on amlodipine, Lisinopril.             Hx of CAD, hyperlipidemia, off Crestor, ASA. LDL 61 07/18/21.  10/09/20 EKG SR vent rate 65, no significant ST-T changes.              GERD, takes Omeprazole.              Insomnia, stable, off Trazodone.  Chronic lower back pain/right hip pain,  off Gabapentin             OA, s/p left hip surgery, chronic right hip pain, w/c helps, prn Tylenol             Seizure: takes Keppra, F/u neurology       Past Medical History:  Diagnosis Date   Allergic rhinitis    Amnesia    Atherosclerotic heart disease of native coronary artery without angina pectoris    BPH (benign prostatic hyperplasia)    Bradycardia    Chronic diastolic (congestive) heart failure (HCC)    Colon polyps    Coronary artery disease    MODERATE   Dizziness    Edema of lower extremity    Erectile dysfunction    H. pylori infection    Hx of    Hard of hearing    Headache(784.0)    History of colon polyps 2009   Dr. Orr/Colonoscopy -small cecal adenoma   History of falling    Hx of colonoscopy 2009   Dr Lajoyce Corners, hemorrhoids & polyps   Hyperlipidemia     Hypertension    Hypertensive heart disease with heart failure ([place])    Hypokalemia    Internal hemorrhoids 2009   Dr. Lajoyce Corners Colonoscopy   Metabolic encephalopathy    Mitral valve regurgitation    Muscle weakness (generalized)    Pneumonia    Primary generalized (osteo)arthritis    Reflux esophagitis    Sepsis ([place]) 11/04/2018   Unspecified hemorrhoids    Unsteadiness on feet    Vitamin D deficiency    Past Surgical History:  Procedure Laterality Date   APPENDECTOMY     CARDIAC CATHETERIZATION  1997   REVEALED MILD TO MODERATE IRREGULARITIES. HIS LEFT EVNTRICULAR SYSTOLIC FUNCTION REVEALED NORMAL EJECTION FRACTION WITH EF OF 70%   CATARACT EXTRACTION, BILATERAL     COLONOSCOPY  multiple   fatty tumor removal     benign   HEMORROIDECTOMY     HIP ARTHROPLASTY Left 08/17/2013   Procedure: ARTHROPLASTY BIPOLAR HIP;  Surgeon: Gearlean Alf, MD;  Location: [facility];  Service: Orthopedics;  Laterality: Left;   INGUINAL HERNIA REPAIR Bilateral 2006   lower back surgery  2006   NASAL SEPTUM SURGERY     TONSILLECTOMY     UPPER GASTROINTESTINAL ENDOSCOPY      No Known Allergies  Outpatient Encounter Medications as of 06/05/2022  Medication Sig   acetaminophen (TYLENOL) 500 MG tablet Take 500 mg by mouth every 8 (eight) hours as needed.   amLODipine (NORVASC) 5 MG tablet Take 5 mg by mouth daily.   aspirin 325 MG tablet Take 325 mg by mouth daily.   Carboxymethylcellulose Sodium (ARTIFICIAL TEARS OP) Apply 1 drop to eye as needed.   cefTRIAXone (ROCEPHIN) 1 g injection Inject 1 g into the muscle daily.   Emollient (CERAVE) CREA Apply topically at bedtime. To lower extremities @ bedtime   hydrocortisone (ANUSOL-HC) 2.5 % rectal cream Place 1 application rectally 2 (two) times daily as needed for anal itching or hemorrhoids (USE PERINEAL APPLICATOR). With perineal applicator   levETIRAcetam (KEPPRA) 500 MG tablet Take 500 mg by mouth 2 (two) times daily.   lisinopril (ZESTRIL) 40 MG  tablet Take 40 mg by mouth daily.   omeprazole (PRILOSEC) 20 MG capsule Take 20 mg by mouth daily.   [DISCONTINUED] Multiple Vitamins-Minerals (THERA-M) TABS Take 1 tablet by mouth daily.   [DISCONTINUED] potassium chloride SA (KLOR-CON) 20 MEQ tablet  Take 20 mEq by mouth daily.   [DISCONTINUED] rosuvastatin (CRESTOR) 5 MG tablet Take 5 mg by mouth daily.    [DISCONTINUED] torsemide (DEMADEX) 20 MG tablet Take 20 mg by mouth daily.   [DISCONTINUED] vitamin B-12 (CYANOCOBALAMIN) 1000 MCG tablet Take 1,000 mcg by mouth daily.   No facility-administered encounter medications on file as of 06/05/2022.    Review of Systems  Constitutional:  Positive for activity change, appetite change and fatigue. Negative for fever.  HENT:  Positive for hearing loss. Negative for congestion and voice change.   Eyes:  Negative for visual disturbance.  Respiratory:  Negative for cough, shortness of breath and wheezing.   Cardiovascular:  Negative for leg swelling.  Gastrointestinal:  Negative for constipation.       ? Suprapubic area discomfort.   Genitourinary:  Positive for frequency. Negative for difficulty urinating and urgency.  Musculoskeletal:  Positive for arthralgias, back pain and gait problem.       Lower back, right hip/thigh pain   Skin:  Positive for wound.       Chronic venous insufficiency skin changes BLE, pressure wounds feet/ankles.   Neurological:  Negative for seizures, weakness and headaches.       Memory lapses. Tingling in toes. Right hand weakness/numbness is improved, placed ASA 325 11/13/20  Psychiatric/Behavioral:  Negative for behavioral problems and sleep disturbance. The patient is not nervous/anxious.     Immunization History  Administered Date(s) Administered   Influenza, High Dose Seasonal PF 03/19/2019, 03/19/2019, 04/14/2022   Influenza-Unspecified 04/08/2016, 03/27/2020, 04/03/2021   Moderna Sars-Covid-2 Vaccination 06/18/2019, 07/16/2019, 04/24/2020, 11/13/2020    Pfizer Covid-19 Vaccine Bivalent Booster 61yr & up 03/05/2021   Pneumococcal Conjugate-13 06/02/2013   Pneumococcal Polysaccharide-23 04/07/2001   Tetanus 08/14/2013   Zoster Recombinat (Shingrix) 08/03/2021   Pertinent  Health Maintenance Due  Topic Date Due   INFLUENZA VACCINE  Completed      09/22/2019    7:30 AM 09/22/2019    9:00 PM 09/23/2019    9:30 AM 01/12/2020    3:02 AM 04/04/2022    9:23 AM  Fall Risk  Falls in the past year?     0  Was there an injury with Fall?     0  Fall Risk Category Calculator     0  Fall Risk Category     Low  Patient Fall Risk Level High fall risk High fall risk High fall risk High fall risk Low fall risk  Patient at Risk for Falls Due to     History of fall(s);Impaired balance/gait  Fall risk Follow up     Falls evaluation completed   Functional Status Survey:    Vitals:   06/05/22 1101  BP: (!) 167/96  Pulse: 87  Resp: (!) 22  Temp: 98.9 F (37.2 C)  SpO2: 93%  Weight: 182 lb (82.6 kg)  Height: '5\' 7"'$  (1.702 m)   Body mass index is 28.51 kg/m. Physical Exam Vitals and nursing note reviewed.  Constitutional:      Comments: Generalized weakness, able to be aroused when spoke to, followed simple directions and answered questions.   HENT:     Head: Normocephalic and atraumatic.     Mouth/Throat:     Mouth: Mucous membranes are moist.  Eyes:     Conjunctiva/sclera: Conjunctivae normal.     Pupils: Pupils are equal, round, and reactive to light.  Cardiovascular:     Rate and Rhythm: Normal rate and regular rhythm.     Heart  sounds: Murmur heard.  Pulmonary:     Breath sounds: No rales.  Abdominal:     General: Bowel sounds are normal.     Palpations: Abdomen is soft.     Tenderness: There is no guarding.     Comments: ? Suprapubic area discomfort/tenderness.   Genitourinary:    Comments: External hemorrhoids, no bleeding or injury.  Musculoskeletal:     Cervical back: Normal range of motion and neck supple. No muscular  tenderness.     Right lower leg: No edema.     Left lower leg: No edema.     Comments: Bed rest now  Skin:    General: Skin is warm and dry.     Comments: Chronic venous insufficiency skin changes BLE.  Pressure ulcers: R foot/ankle: lateral heel, lateral mid foot, medial malleolus. L foot/ankle: lateral/medial heel, medial malleolus.  Neurological:     General: No focal deficit present.     Mental Status: He is alert. Mental status is at baseline.     Motor: No weakness.     Coordination: Coordination normal.     Gait: Gait abnormal.     Comments: Oriented to person  Psychiatric:        Mood and Affect: Mood normal.        Behavior: Behavior normal.     Labs reviewed: Recent Labs    07/18/21 0000 10/15/21 0000 11/21/21 0000 05/26/22 0000  NA 141 143 143 140  K 4.3 5.1 4.1 4.4  CL 102 106 108 105  CO2 31* 24* 30* 25*  BUN 16 35* 22* 28*  CREATININE 1.0 1.3 1.1  --   CALCIUM 9.3 9.6 8.7  --    Recent Labs    07/18/21 0000 05/26/22 0000  AST 19 85*  ALT 17 96*  ALKPHOS 61  --   ALBUMIN 3.9 3.4*   Recent Labs    07/18/21 0000 12/19/21 0000 05/26/22 0000  WBC 7.4 8.2 34.0  NEUTROABS  --  4,674.00 31,790.00  HGB 14.1 12.5* 11.3*  HCT 41 36* 32*  PLT 223 218 172   Lab Results  Component Value Date   TSH 1.77 04/06/2020   No results found for: "HGBA1C" Lab Results  Component Value Date   CHOL 131 07/18/2021   HDL 57 07/18/2021   LDLCALC 61 07/18/2021   TRIG 57 07/18/2021   CHOLHDL 2.7 02/17/2017    Significant Diagnostic Results in last 30 days:  No results found.  Assessment/Plan Decubitus ulcer with suspected deep tissue injury R foot/ankle: lateral heel, lateral mid foot, medial malleolus. L foot/ankle: lateral/medial heel, medial malleolus.  Apply hydrocolloid dressing right lateral mid foot, float feet/ankles.   Hypernatremia Na 161, K 3.6, Bun 24, creat 0.96 06/04/22, started 1/2 NS 1075m, infiltrated about about 5061minfused. Will  restart D5 @ 75cc/ml x200093mupdate BMP, encourage oral fluid intake.   UTI (urinary tract infection) fully treated with 10 day course of Rocephin.   Dysphagia, oropharyngeal phase nectar thick.   Venous insufficiency (chronic) (peripheral) Hx of ulcers of BLE, wound care clinic   History of TIA (transient ischemic attack) resolved right hand numbness, weakness, difficulty feeding self, changed to ASA '325mg'$  qd from '81mg'$  11/13/20, on Statin, declined neurology consultation.   BPH (benign prostatic hyperplasia)  saw urology. Chronic cystitis  Chronic atrial fibrillation ([facility] Basenset 09/2019, heart rate is controlled, on ASA  Chronic diastolic heart failure (HCC) EF 65-81-19%rade 1 diastolic dysfunction, off Furosemide, saw Cardiology  Hypertension  Continue blood pressure control with amlodipine, Lisinopril.  GERD (gastroesophageal reflux disease) Stable, takes Omeprazole.   Seizure ([place]) takes Keppra, F/u neurology   Adult failure to thrive Continue palliative approach, multiple factorial.      Family/ staff Communication: plan of care reviewed with the patient and charge nurse.   Labs/tests ordered:  BMP 06/10/22  Time spend 35 minutes.

## 2022-06-05 NOTE — Assessment & Plan Note (Signed)
Hx of ulcers of BLE, wound care clinic

## 2022-06-05 NOTE — Assessment & Plan Note (Signed)
Na 161, K 3.6, Bun 24, creat 0.96 06/04/22, started 1/2 NS 1022m, infiltrated about about 5020minfused. Will restart D5 @ 75cc/ml x200081mupdate BMP, encourage oral fluid intake.

## 2022-06-05 NOTE — Assessment & Plan Note (Signed)
resolved right hand numbness, weakness, difficulty feeding self, changed to ASA '325mg'$  qd from '81mg'$  11/13/20, on Statin, declined neurology consultation.

## 2022-06-05 NOTE — Assessment & Plan Note (Signed)
fully treated with 10 day course of Rocephin.

## 2022-06-05 NOTE — Assessment & Plan Note (Signed)
takes Keppra, F/u neurology

## 2022-06-05 NOTE — Assessment & Plan Note (Signed)
EF 35-33%, grade 1 diastolic dysfunction, off Furosemide, saw Cardiology

## 2022-06-05 NOTE — Assessment & Plan Note (Signed)
Stable,  takes Omeprazole 

## 2022-06-06 ENCOUNTER — Encounter: Payer: Self-pay | Admitting: Family Medicine

## 2022-06-06 ENCOUNTER — Encounter (HOSPITAL_COMMUNITY): Payer: Self-pay

## 2022-06-06 ENCOUNTER — Emergency Department (HOSPITAL_COMMUNITY): Payer: Medicare HMO

## 2022-06-06 ENCOUNTER — Other Ambulatory Visit: Payer: Self-pay

## 2022-06-06 ENCOUNTER — Encounter: Payer: Self-pay | Admitting: Orthopedic Surgery

## 2022-06-06 ENCOUNTER — Non-Acute Institutional Stay (SKILLED_NURSING_FACILITY): Payer: Medicare HMO | Admitting: Orthopedic Surgery

## 2022-06-06 ENCOUNTER — Emergency Department (HOSPITAL_COMMUNITY)
Admission: EM | Admit: 2022-06-06 | Discharge: 2022-06-06 | Disposition: A | Discharge status: Home / Self Care | Payer: Medicare HMO | Attending: Emergency Medicine | Admitting: Emergency Medicine

## 2022-06-06 DIAGNOSIS — I2510 Atherosclerotic heart disease of native coronary artery without angina pectoris: Secondary | ICD-10-CM | POA: Diagnosis not present

## 2022-06-06 DIAGNOSIS — R569 Unspecified convulsions: Secondary | ICD-10-CM

## 2022-06-06 DIAGNOSIS — I110 Hypertensive heart disease with heart failure: Secondary | ICD-10-CM | POA: Diagnosis not present

## 2022-06-06 DIAGNOSIS — D72829 Elevated white blood cell count, unspecified: Secondary | ICD-10-CM | POA: Diagnosis not present

## 2022-06-06 DIAGNOSIS — Z7982 Long term (current) use of aspirin: Secondary | ICD-10-CM | POA: Insufficient documentation

## 2022-06-06 DIAGNOSIS — E870 Hyperosmolality and hypernatremia: Secondary | ICD-10-CM | POA: Diagnosis not present

## 2022-06-06 DIAGNOSIS — I629 Nontraumatic intracranial hemorrhage, unspecified: Secondary | ICD-10-CM

## 2022-06-06 DIAGNOSIS — R609 Edema, unspecified: Secondary | ICD-10-CM | POA: Diagnosis not present

## 2022-06-06 DIAGNOSIS — E860 Dehydration: Secondary | ICD-10-CM | POA: Insufficient documentation

## 2022-06-06 DIAGNOSIS — R4182 Altered mental status, unspecified: Secondary | ICD-10-CM | POA: Diagnosis not present

## 2022-06-06 DIAGNOSIS — Z8673 Personal history of transient ischemic attack (TIA), and cerebral infarction without residual deficits: Secondary | ICD-10-CM | POA: Insufficient documentation

## 2022-06-06 DIAGNOSIS — Z79899 Other long term (current) drug therapy: Secondary | ICD-10-CM | POA: Diagnosis not present

## 2022-06-06 DIAGNOSIS — Z1152 Encounter for screening for COVID-19: Secondary | ICD-10-CM | POA: Insufficient documentation

## 2022-06-06 DIAGNOSIS — E876 Hypokalemia: Secondary | ICD-10-CM | POA: Diagnosis not present

## 2022-06-06 DIAGNOSIS — Z743 Need for continuous supervision: Secondary | ICD-10-CM | POA: Diagnosis not present

## 2022-06-06 DIAGNOSIS — J9811 Atelectasis: Secondary | ICD-10-CM | POA: Diagnosis not present

## 2022-06-06 DIAGNOSIS — I509 Heart failure, unspecified: Secondary | ICD-10-CM | POA: Insufficient documentation

## 2022-06-06 DIAGNOSIS — I619 Nontraumatic intracerebral hemorrhage, unspecified: Secondary | ICD-10-CM | POA: Diagnosis not present

## 2022-06-06 DIAGNOSIS — R531 Weakness: Secondary | ICD-10-CM

## 2022-06-06 DIAGNOSIS — R404 Transient alteration of awareness: Secondary | ICD-10-CM | POA: Diagnosis not present

## 2022-06-06 DIAGNOSIS — I739 Peripheral vascular disease, unspecified: Secondary | ICD-10-CM | POA: Diagnosis not present

## 2022-06-06 DIAGNOSIS — G9389 Other specified disorders of brain: Secondary | ICD-10-CM | POA: Diagnosis not present

## 2022-06-06 DIAGNOSIS — Z7401 Bed confinement status: Secondary | ICD-10-CM | POA: Diagnosis not present

## 2022-06-06 LAB — CBC WITH DIFFERENTIAL/PLATELET
Abs Immature Granulocytes: 0.07 10*3/uL (ref 0.00–0.07)
Basophils Absolute: 0.1 10*3/uL (ref 0.0–0.1)
Basophils Relative: 0 %
Eosinophils Absolute: 0.1 10*3/uL (ref 0.0–0.5)
Eosinophils Relative: 1 %
HCT: 39.4 % (ref 39.0–52.0)
Hemoglobin: 12.1 g/dL — ABNORMAL LOW (ref 13.0–17.0)
Immature Granulocytes: 0 %
Lymphocytes Relative: 12 %
Lymphs Abs: 1.9 10*3/uL (ref 0.7–4.0)
MCH: 32 pg (ref 26.0–34.0)
MCHC: 30.7 g/dL (ref 30.0–36.0)
MCV: 104.2 fL — ABNORMAL HIGH (ref 80.0–100.0)
Monocytes Absolute: 0.7 10*3/uL (ref 0.1–1.0)
Monocytes Relative: 4 %
Neutro Abs: 13.3 10*3/uL — ABNORMAL HIGH (ref 1.7–7.7)
Neutrophils Relative %: 83 %
Platelets: 297 10*3/uL (ref 150–400)
RBC: 3.78 MIL/uL — ABNORMAL LOW (ref 4.22–5.81)
RDW: 13.2 % (ref 11.5–15.5)
WBC: 16.1 10*3/uL — ABNORMAL HIGH (ref 4.0–10.5)
nRBC: 0 % (ref 0.0–0.2)

## 2022-06-06 LAB — I-STAT CHEM 8, ED
BUN: 18 mg/dL (ref 8–23)
Calcium, Ion: 1.14 mmol/L — ABNORMAL LOW (ref 1.15–1.40)
Chloride: 117 mmol/L — ABNORMAL HIGH (ref 98–111)
Creatinine, Ser: 0.9 mg/dL (ref 0.61–1.24)
Glucose, Bld: 129 mg/dL — ABNORMAL HIGH (ref 70–99)
HCT: 35 % — ABNORMAL LOW (ref 39.0–52.0)
Hemoglobin: 11.9 g/dL — ABNORMAL LOW (ref 13.0–17.0)
Potassium: 3.4 mmol/L — ABNORMAL LOW (ref 3.5–5.1)
Sodium: 154 mmol/L — ABNORMAL HIGH (ref 135–145)
TCO2: 24 mmol/L (ref 22–32)

## 2022-06-06 LAB — PROTIME-INR
INR: 1.4 — ABNORMAL HIGH (ref 0.8–1.2)
Prothrombin Time: 16.8 seconds — ABNORMAL HIGH (ref 11.4–15.2)

## 2022-06-06 LAB — COMPREHENSIVE METABOLIC PANEL
ALT: 55 U/L — ABNORMAL HIGH (ref 0–44)
AST: 32 U/L (ref 15–41)
Albumin: 2.3 g/dL — ABNORMAL LOW (ref 3.5–5.0)
Alkaline Phosphatase: 149 U/L — ABNORMAL HIGH (ref 38–126)
Anion gap: 4 — ABNORMAL LOW (ref 5–15)
BUN: 18 mg/dL (ref 8–23)
CO2: 25 mmol/L (ref 22–32)
Calcium: 7.9 mg/dL — ABNORMAL LOW (ref 8.9–10.3)
Chloride: 119 mmol/L — ABNORMAL HIGH (ref 98–111)
Creatinine, Ser: 1.06 mg/dL (ref 0.61–1.24)
GFR, Estimated: >60 mL/min (ref >60)
Glucose, Bld: 133 mg/dL — ABNORMAL HIGH (ref 70–99)
Potassium: 3.2 mmol/L — ABNORMAL LOW (ref 3.5–5.1)
Sodium: 148 mmol/L — ABNORMAL HIGH (ref 135–145)
Total Bilirubin: 1 mg/dL (ref 0.3–1.2)
Total Protein: 5.8 g/dL — ABNORMAL LOW (ref 6.5–8.1)

## 2022-06-06 LAB — URINALYSIS, ROUTINE W REFLEX MICROSCOPIC
Bacteria, UA: NONE SEEN
Bilirubin Urine: NEGATIVE
Glucose, UA: NEGATIVE mg/dL
Ketones, ur: NEGATIVE mg/dL
Leukocytes,Ua: NEGATIVE
Nitrite: NEGATIVE
Protein, ur: 30 mg/dL — AB
Specific Gravity, Urine: 1.019 (ref 1.005–1.030)
pH: 5 (ref 5.0–8.0)

## 2022-06-06 LAB — RESP PANEL BY RT-PCR (RSV, FLU A&B, COVID)  RVPGX2
Influenza A by PCR: NEGATIVE
Influenza B by PCR: NEGATIVE
Resp Syncytial Virus by PCR: NEGATIVE
SARS Coronavirus 2 by RT PCR: NEGATIVE

## 2022-06-06 LAB — LACTIC ACID, PLASMA: Lactic Acid, Venous: 1.7 mmol/L (ref 0.5–1.9)

## 2022-06-06 LAB — APTT: aPTT: 30 seconds (ref 24–36)

## 2022-06-06 MED ORDER — POTASSIUM CHLORIDE 10 MEQ/100ML IV SOLN
10.0000 meq | INTRAVENOUS | Status: AC
  Administered 2022-06-06: 10 meq via INTRAVENOUS
  Filled 2022-06-06: qty 100

## 2022-06-06 MED ORDER — POTASSIUM CHLORIDE CRYS ER 20 MEQ PO TBCR: 40.0000 meq | EXTENDED_RELEASE_TABLET | Freq: Once | ORAL | Status: DC

## 2022-06-06 MED ORDER — SODIUM CHLORIDE 0.9 % IV BOLUS
500.0000 mL | Freq: Once | INTRAVENOUS | Status: AC
  Administered 2022-06-06: 500 mL via INTRAVENOUS

## 2022-06-06 MED ORDER — LEVETIRACETAM IN NACL 1000 MG/100ML IV SOLN
1000.0000 mg | Freq: Once | INTRAVENOUS | Status: AC
  Administered 2022-06-06: 1000 mg via INTRAVENOUS
  Filled 2022-06-06: qty 100

## 2022-06-06 NOTE — ED Notes (Signed)
[facility] called for transport.  

## 2022-06-06 NOTE — ED Notes (Signed)
Pt awaiting ptar

## 2022-06-06 NOTE — ED Notes (Signed)
Patient is resting comfortably. 

## 2022-06-06 NOTE — ED Triage Notes (Signed)
Pt BIB [facility] from [facility] d/t increased weakness over the last several days. Staff there noticed pt was not walking as much as usual, nor speaking. His routine blood work resulted a Sodium level of 160. An IV was started at facility & they administered some medication for his Na level that fully infused in an infiltrated vein. EMS arrived here with pt & that infiltrated arm was very swollen. Hx of hyponatremia & seizures.130/90, 96% on RA, resp 24, afebrile, 70 bpm, CBG 162, 12L showed prolonged QT, is a DNR.

## 2022-06-06 NOTE — ED Notes (Signed)
Family at bedside. 

## 2022-06-06 NOTE — ED Notes (Addendum)
Pt had large BM- changed sheets and fresh brief placed.

## 2022-06-06 NOTE — ED Provider Notes (Signed)
  Physical Exam  BP (!) 160/50 (BP Location: Right Arm)   Pulse 65   Temp 98 F (36.7 C) (Oral)   Resp 18   SpO2 98%   Physical Exam  Procedures  Procedures  ED Course / MDM   Clinical Course as of 06/07/22 1345  Fri Jun 06, 2022  1602 Assumed care from Dr. Tamera Punt.  88 yo M with seizures on keppra, CAD, atrial fibrillation and prior TIA who pw with possible seizure from his facility.  Has recently been treated for hyponatremia as well as UTI.  Today had possible seizure so was referred into the emergency department for evaluation.  Urine is not consistent with UTI at this time.  CT head revealed small possible hemorrhage so neurosurgery was consulted.  Patient's daughter feels that she would prefer to pursue comfort measures rather than aggressive treatment at this time.  They are following with palliative care but he is not full hospice at this time.  Will discuss with neurosurgery and likely discharge to this facility afterwards.  [RP]  1640 Repaging neurosurgery. [RP]  1649 Dr Pieter Partridge [facility] NSGY feels pt should have stability scan and be discussed with neurology.  Feels the patient may be suitable for outpatient follow-up with neurology if his scan is stable. [RP]  1952 Dr Lorrin Goodell has reviewed case and recommends holding aspirin for several weeks.  Also recommends continue outpatient blood pressure management. [RP]  2030 Patient reassessed and has not had any additional seizures.  Repeat head CT did not show any growth of the lesion that is suspicious for possible right thalamic hemorrhage.  Called patient's daughters and discussed that he will need to hold his aspirin for several weeks and then follow-up with neurology and discuss when it is safe to resume this medication.  Also instructed them to have him follow-up with his primary doctor in 2 to 3 days. [RP]    Clinical Course User Index [RP] Fransico Meadow, MD   Medical Decision Making Amount and/or Complexity of Data  Reviewed Labs: ordered. Radiology: ordered. ECG/medicine tests: ordered.  Risk Prescription drug management.     Fransico Meadow, MD 06/07/22 1345

## 2022-06-06 NOTE — Progress Notes (Signed)
Location:  [facility] Room Number: [place] of Service:  SNF [phone]) Provider:  Yvonna Alanis, NP   Wardell Honour, MD  Patient Care Team: Wardell Honour, MD as PCP - General (Family Medicine) Gatha Mayer, MD as Consulting Physician (Gastroenterology) [name], [name], MD as Consulting Physician (Cardiology) [name], MD as Attending Physician (Internal Medicine) Mast, Man X, NP as Nurse Practitioner (Internal Medicine)  Extended Emergency Contact Information Primary Emergency Contact: Herrada,[name] Address: [place], [place] [address] [name] of [place] Phone: [phone] Mobile Phone: [phone] Relation: Daughter Secondary Emergency Contact: Hauter,[name] Address: [place] [address] [place] Phone: [phone] Mobile Phone: [phone] Relation: Son  Code Status:  DNR Goals of care: Advanced Directive information    05/28/2022    1:20 PM  Advanced Directives  Does Patient Have a Medical Advance Directive? Yes  Type of Paramedic of [place];Living will;Out of facility DNR (pink MOST or yellow form)  Does patient want to make changes to medical advance directive? No - Patient declined  Copy of [place] in Chart? Yes - validated most recent copy scanned in chart (See row information)  Pre-existing out of facility DNR order (yellow form or pink MOST form) Yellow form placed in chart (order not valid for inpatient use)     Chief Complaint  Patient presents with   Acute Visit    Hypernatremia, seizure like activity     HPI:  Pt is a 88 y.o. male seen today for acute visit due to seizure activity, hypernatremia.   He currently resides on the skilled nursing unit at [facility]. PMH: HTN, CAD, atrial fibrillation, H/o TIA, diastolic heart failure, dysphagia, GERD, meningioma, OA,  BPH, seizure, and adult failure to thrive. Followed by palliative.   Recent lab work 06/05/2022: Na+ 161, K 3.6, BUN/creat 24/0.96. He was started on D5W 1000 cc for hypernatremia. He got about 500 cc in until IV infiltrated. Nursing staff was able to infuse another 500 cc and IV infiltrated again. This morning, he was alert and able to follow some simple commands during my encounter. Nonverbal, normally more talkative. Left arm swollen from IV infiltrations. Does not appear to be in any pain. Nursing staff attempted to draw stat bmp when I left room and his upper body began to shake, unable to follow commands, eyes could not focus and gazed to left. Incident lasted about 2 minutes. MOST form not complete. He has a history of TIA with right hand weakness and seizure. He remains on keppra and asa. Daughter notified on concerns. Plan to sent to ED for evaluation.    Past Medical History:  Diagnosis Date   Allergic rhinitis    Amnesia    Atherosclerotic heart disease of native coronary artery without angina pectoris    BPH (benign prostatic hyperplasia)    Bradycardia    Chronic diastolic (congestive) heart failure (HCC)    Colon polyps    Coronary artery disease    MODERATE   Dizziness    Edema of lower extremity    Erectile dysfunction    H. pylori infection    Hx of    Hard of hearing    Headache(784.0)    History of colon polyps 2009   Dr. Orr/Colonoscopy -small cecal adenoma  History of falling    Hx of colonoscopy 2009   Dr Lajoyce Corners, hemorrhoids & polyps   Hyperlipidemia    Hypertension    Hypertensive heart disease with heart failure [facility])    Hypokalemia    Internal hemorrhoids 2009   Dr. Lajoyce Corners Colonoscopy   Metabolic encephalopathy    Mitral valve regurgitation    Muscle weakness (generalized)    Pneumonia    Primary generalized (osteo)arthritis    Reflux esophagitis    Sepsis ([place]) 11/04/2018   Unspecified hemorrhoids    Unsteadiness on feet    Vitamin D deficiency     Past Surgical History:  Procedure Laterality Date   APPENDECTOMY     CARDIAC CATHETERIZATION  1997   REVEALED MILD TO MODERATE IRREGULARITIES. HIS LEFT EVNTRICULAR SYSTOLIC FUNCTION REVEALED NORMAL EJECTION FRACTION WITH EF OF 70%   CATARACT EXTRACTION, BILATERAL     COLONOSCOPY  multiple   fatty tumor removal     benign   HEMORROIDECTOMY     HIP ARTHROPLASTY Left 08/17/2013   Procedure: ARTHROPLASTY BIPOLAR HIP;  Surgeon: Gearlean Alf, MD;  Location: [facility];  Service: Orthopedics;  Laterality: Left;   INGUINAL HERNIA REPAIR Bilateral 2006   lower back surgery  2006   NASAL SEPTUM SURGERY     TONSILLECTOMY     UPPER GASTROINTESTINAL ENDOSCOPY      No Known Allergies  Outpatient Encounter Medications as of 06/06/2022  Medication Sig   acetaminophen (TYLENOL) 500 MG tablet Take 500 mg by mouth every 8 (eight) hours as needed.   amLODipine (NORVASC) 5 MG tablet Take 5 mg by mouth daily.   aspirin 325 MG tablet Take 325 mg by mouth daily.   Carboxymethylcellulose Sodium (ARTIFICIAL TEARS OP) Apply 1 drop to eye as needed.   cefTRIAXone (ROCEPHIN) 1 g injection Inject 1 g into the muscle daily.   Emollient (CERAVE) CREA Apply topically at bedtime. To lower extremities @ bedtime   hydrocortisone (ANUSOL-HC) 2.5 % rectal cream Place 1 application rectally 2 (two) times daily as needed for anal itching or hemorrhoids (USE PERINEAL APPLICATOR). With perineal applicator   levETIRAcetam (KEPPRA) 500 MG tablet Take 500 mg by mouth 2 (two) times daily.   lisinopril (ZESTRIL) 40 MG tablet Take 40 mg by mouth daily.   omeprazole (PRILOSEC) 20 MG capsule Take 20 mg by mouth daily.   No facility-administered encounter medications on file as of 06/06/2022.    Review of Systems  Unable to perform ROS: Acuity of condition    Immunization History  Administered Date(s) Administered   Influenza, High Dose Seasonal PF 03/19/2019, 03/19/2019, 04/14/2022   Influenza-Unspecified 04/08/2016,  03/27/2020, 04/03/2021   Moderna Sars-Covid-2 Vaccination 06/18/2019, 07/16/2019, 04/24/2020, 11/13/2020   Pfizer Covid-19 Vaccine Bivalent Booster 76yr & up 03/05/2021   Pneumococcal Conjugate-13 06/02/2013   Pneumococcal Polysaccharide-23 04/07/2001   Tetanus 08/14/2013   Zoster Recombinat (Shingrix) 08/03/2021   Pertinent  Health Maintenance Due  Topic Date Due   INFLUENZA VACCINE  Completed      09/22/2019    7:30 AM 09/22/2019    9:00 PM 09/23/2019    9:30 AM 01/12/2020    3:02 AM 04/04/2022    9:23 AM  Fall Risk  Falls in the past year?     0  Was there an injury with Fall?     0  Fall Risk Category Calculator     0  Fall Risk Category     Low  Patient Fall Risk Level High fall risk  High fall risk High fall risk High fall risk Low fall risk  Patient at Risk for Falls Due to     History of fall(s);Impaired balance/gait  Fall risk Follow up     Falls evaluation completed   Functional Status Survey:    Vitals:   06/06/22 1000  BP: (!) 147/76  Pulse: 61  Resp: (!) 21  Temp: 98.2 F (36.8 C)  SpO2: 93%  Weight: 182 lb 9.6 oz (82.8 kg)  Height: '5\' 7"'$  (1.702 m)   Body mass index is 28.6 kg/m. Physical Exam Vitals reviewed.  Constitutional:      General: He is not in acute distress. HENT:     Head: Normocephalic.  Eyes:     General:        Right eye: No discharge.        Left eye: No discharge.     Comments: Left eye gaze  Cardiovascular:     Rate and Rhythm: Normal rate and regular rhythm.     Pulses: Normal pulses.     Heart sounds: Murmur heard.  Pulmonary:     Effort: Pulmonary effort is normal. No respiratory distress.     Breath sounds: Normal breath sounds. No wheezing.  Abdominal:     General: Bowel sounds are normal. There is no distension.     Palpations: Abdomen is soft.     Tenderness: There is no abdominal tenderness.  Musculoskeletal:     Cervical back: Neck supple.     Right lower leg: No edema.     Left lower leg: No edema.     Comments:  Left arm swollen  Skin:    General: Skin is warm and dry.     Capillary Refill: Capillary refill takes less than 2 seconds.     Comments: BLE with multiple pressure ulcers, vary in size. Chronic venous insufficiency to lower extremities.   Neurological:     General: No focal deficit present.     Mental Status: He is alert. Mental status is at baseline. He is disoriented.     Motor: Weakness and seizure activity present.     Gait: Gait abnormal.     Comments: Abnormal posture/leaning  Psychiatric:        Mood and Affect: Mood normal.     Labs reviewed: Recent Labs    07/18/21 0000 10/15/21 0000 11/21/21 0000 05/26/22 0000  NA 141 143 143 140  K 4.3 5.1 4.1 4.4  CL 102 106 108 105  CO2 31* 24* 30* 25*  BUN 16 35* 22* 28*  CREATININE 1.0 1.3 1.1  --   CALCIUM 9.3 9.6 8.7  --    Recent Labs    07/18/21 0000 05/26/22 0000  AST 19 85*  ALT 17 96*  ALKPHOS 61  --   ALBUMIN 3.9 3.4*   Recent Labs    07/18/21 0000 12/19/21 0000 05/26/22 0000  WBC 7.4 8.2 34.0  NEUTROABS  --  4,674.00 31,790.00  HGB 14.1 12.5* 11.3*  HCT 41 36* 32*  PLT 223 218 172   Lab Results  Component Value Date   TSH 1.77 04/06/2020   No results found for: "HGBA1C" Lab Results  Component Value Date   CHOL 131 07/18/2021   HDL 57 07/18/2021   LDLCALC 61 07/18/2021   TRIG 57 07/18/2021   CHOLHDL 2.7 02/17/2017    Significant Diagnostic Results in last 30 days:  No results found.  Assessment/Plan 1. Hypernatremia - 12/21 Na+ 161> D5W ordered>  infused about 1000 cc > IV kept infiltrating - poor oral intake due to FOT  2. Seizure (HCC) - on Keppra - episode of abnormal posturing, nonverbal, shaking, left eye gaze - TIA versus seizure - see above - sent to ED for evaluation - recommend discussing MOST form with daughter in future   Family/ staff Communication: plan discussed with patient and nurse  Labs/tests ordered:  none

## 2022-06-06 NOTE — Discharge Instructions (Signed)
You were seen for your seizure in the emergency department and were found to have a small brain bleed (intraparenchymal hemorrhage).  It was stable and we discussed admission with you and your daughter but you both preferred to go home.  At home, please take your blood pressure medicine and hold off on taking your aspirin since this can make bleeding worse.  Please discuss with neurology when it is safe to resume this medication.  Follow-up with your primary doctor in 2-3 days regarding your visit.  Follow-up with neurology in 1 week.  Return immediately to the emergency department if you experience any of the following: Worsening headache, seizures, or any other concerning symptoms.    Thank you for visiting our Emergency Department. It was a pleasure taking care of you today.

## 2022-06-06 NOTE — Progress Notes (Signed)
[facility] Hospital Liaison note:  This patient is currently enrolled in [facility] outpatient-based Palliative Care. Will continue to follow for disposition.  Please call with any outpatient palliative questions or concerns.  Thank you, Dee Curry, LPN [facility] Hospital Liaison [phone] 

## 2022-06-06 NOTE — ED Notes (Signed)
Attempted to call [facility] x 1 - sent to voicemail. This RN left name and number for return call and report

## 2022-06-06 NOTE — ED Notes (Signed)
Patient transported to CT 

## 2022-06-06 NOTE — ED Provider Notes (Addendum)
[facility] EMERGENCY DEPARTMENT Provider Note   CSN: [id number] Arrival date & time: 06/06/22  1105     History  Chief Complaint  Patient presents with   AMS   Weakness    [name] [name] is a 88 y.o. male.  [facility] a 88 [facility] with a [facility] of hypertension, coronary artery disease, atrial fibrillation, prior TIA with right-sided weakness although that reportedly has resolved, CHF, meningioma, seizure disorder on Keppra.  He presents with altered mental status.  Based on the nursing home report from earlier today on chart review, he has had some decline in mental status over the last few days.  He has been recently treated for UTI and completed course of Rocephin.  He did some blood work yesterday and noted that his sodium was high at 161.  They attempted to give him IV fluids but his IV infiltrated twice although it does sound like he was able to get some IV fluids.  This morning he had with sounds like a witnessed seizure with some jerking and gaze deviation.  This had resolved on EMS arrival.       Home Medications Prior to Admission medications   Medication Sig Start Date End Date Taking? Authorizing Provider  acetaminophen (TYLENOL) 500 MG tablet Take 500 mg by mouth every 8 (eight) hours as needed (pain).   Yes [provider]  amLODipine (NORVASC) 5 MG tablet Take 5 mg by mouth in the morning. (0800)   Yes [provider]  aspirin 325 MG tablet Take 325 mg by mouth in the morning. (0800)   Yes [provider]  Carboxymethylcellulose Sodium (ARTIFICIAL TEARS OP) Place 1 drop into both eyes See admin instructions. 2 entries on MAR: 1 drop into each eye twice daily (0700,2000) + 1 drop into each eye as needed for dry eyes/itching   Yes [provider]  DEXTROSE IV Inject 75 mL/hr into the vein 2 (two) times daily. (0700,1600)   Yes [provider]  Emollient (CERAVE) CREA Apply 1  application  topically See admin instructions. 2 entries on MAR: Apply topically to bilateral legs every M-W-F(1600) + apply topically to both extremities at bedtime   Yes [provider]  Hydroactive Dressings (ULTEC HYDROCOLLOID DRESSING EX) Apply 1 application  topically daily at 6 PM. To right mid foot   Yes [provider]  hydrocortisone (PROCTO-MED HC) 2.5 % rectal cream Place 1 Application rectally every 12 (twelve) hours as needed for hemorrhoids.   Yes [provider]  levETIRAcetam (KEPPRA) 500 MG tablet Take 500 mg by mouth 2 (two) times daily. (0800,2000)   Yes [provider]  lisinopril (ZESTRIL) 40 MG tablet Take 40 mg by mouth in the morning. (0800)   Yes [provider]  omeprazole (PRILOSEC) 20 MG capsule Take 20 mg by mouth in the morning. (0800)   Yes [provider]  cefTRIAXone (ROCEPHIN) 1 g injection Inject 1 g into the muscle daily. (05/27/22-06/04/22) Patient not taking: Reported on 06/06/2022 05/27/22   [provider]      Allergies    Patient has no known allergies.    Review of Systems   Review of Systems  Unable to perform ROS: Mental status change    Physical Exam Updated Vital Signs BP (!) 161/61   Pulse 67   Temp 97.6 F (36.4 C) (Temporal)   Resp (!) 24   SpO2 98%  Physical Exam HENT:  Head: Normocephalic and atraumatic.     Mouth/Throat:     Mouth: Mucous membranes are moist.  Eyes:     Extraocular Movements: Extraocular movements intact.     Pupils: Pupils are equal, round, and reactive to light.  Neck:     Comments: Kyphosis present Cardiovascular:     Rate and Rhythm: Normal rate.     Pulses: Normal pulses.  Pulmonary:     Effort: Pulmonary effort is normal.     Breath sounds: Normal breath sounds.  Abdominal:     General: Abdomen is flat. There is no distension.     Tenderness: There is no abdominal tenderness.  Musculoskeletal:        General: Normal range of  motion.     Comments: Scaly, chronic skin changes to the lower extremities bilaterally.  There is some ecchymosis at the ankles consistent with pressure sores.  Neurological:     Mental Status: He is alert.     Comments: Patient is awake.  Will follow commands.  Will answer some questions although not very well.  It is unclear whether this is from a mental status change or hearing loss.  He is moving all extremities symmetrically.  He has good grip strength bilaterally.  No obvious slurred speech.  He asked me for a couple coffee and sounded pretty clear.     ED Results / Procedures / Treatments   Labs (all labs ordered are listed, but only abnormal results are displayed) Labs Reviewed  COMPREHENSIVE METABOLIC PANEL - Abnormal; Notable for the following components:      Result Value   Sodium 148 (*)    Potassium 3.2 (*)    Chloride 119 (*)    Glucose, Bld 133 (*)    Calcium 7.9 (*)    Total Protein 5.8 (*)    Albumin 2.3 (*)    ALT 55 (*)    Alkaline Phosphatase 149 (*)    Anion gap 4 (*)    All other components within normal limits  CBC WITH DIFFERENTIAL/PLATELET - Abnormal; Notable for the following components:   WBC 16.1 (*)    RBC 3.78 (*)    Hemoglobin 12.1 (*)    MCV 104.2 (*)    Neutro Abs 13.3 (*)    All other components within normal limits  PROTIME-INR - Abnormal; Notable for the following components:   Prothrombin Time 16.8 (*)    INR 1.4 (*)    All other components within normal limits  URINALYSIS, ROUTINE W REFLEX MICROSCOPIC - Abnormal; Notable for the following components:   Color, Urine AMBER (*)    Hgb urine dipstick SMALL (*)    Protein, ur 30 (*)    All other components within normal limits  I-STAT CHEM 8, ED - Abnormal; Notable for the following components:   Sodium 154 (*)    Potassium 3.4 (*)    Chloride 117 (*)    Glucose, Bld 129 (*)    Calcium, Ion 1.14 (*)    Hemoglobin 11.9 (*)    HCT 35.0 (*)    All other components within normal limits   RESP PANEL BY RT-PCR (RSV, FLU A&B, COVID)  RVPGX2  CULTURE, BLOOD (ROUTINE X 2)  CULTURE, BLOOD (ROUTINE X 2)  URINE CULTURE  LACTIC ACID, PLASMA  APTT  LACTIC ACID, PLASMA    EKG EKG Interpretation  Date/Time:  Friday June 06 2022 11:31:27 EST Ventricular Rate:  67 PR Interval:  198 QRS Duration: 104 QT Interval:  455 QTC Calculation: 481 R Axis:   -12 Text Interpretation: Sinus rhythm LVH with secondary repolarization abnormality Inferior infarct, age indeterminate Confirmed by Malvin Johns [phone]) on 06/06/2022 12:44:49 PM  Radiology CT Head Wo Contrast  Result Date: 06/06/2022 CLINICAL DATA:  Mental status change, unknown cause EXAM: CT HEAD WITHOUT CONTRAST TECHNIQUE: Contiguous axial images were obtained from the base of the skull through the vertex without intravenous contrast. RADIATION DOSE REDUCTION: This exam was performed according to the departmental dose-optimization program which includes automated exposure control, adjustment of the mA and/or kV according to patient size and/or use of iterative reconstruction technique. COMPARISON:  01/12/20 FINDINGS: Brain: Small (5 mm) focus of hyperdensity in the right thalamus is new since the prior. No evidence of acute large vascular territory infarction, hydrocephalus, extra-axial collection or mass lesion/mass effect. Vascular: No hyperdense vessel. Skull: No acute fracture. Sinuses/Orbits: Clear sinuses.  No acute orbital findings. Other: No mastoid effusions. IMPRESSION: Small (5 mm) hyperdensity in the right thalamus appears new from prior and is suspicious for small hemorrhage. A small focus of mineralization is a differential consideration. A follow-up CT is recommended to ensure stability. Findings discussed with provider Jamie Belger at 12:30 PM via telephone. Electronically Signed   By: Margaretha Sheffield M.D.   On: 06/06/2022 12:31   DG Chest Port 1 View  Result Date: 06/06/2022 CLINICAL DATA:  Questionable sepsis -  evaluate for abnormality EXAM: PORTABLE CHEST 1 VIEW COMPARISON:  09/19/2019 FINDINGS: Low lung volumes, bibasilar atelectasis. Heart is normal size. No effusions or acute bony abnormality. IMPRESSION: Low lung volumes, bibasilar atelectasis. Electronically Signed   By: Rolm Baptise M.D.   On: 06/06/2022 12:29    Procedures Procedures    Medications Ordered in ED Medications  potassium chloride SA (KLOR-CON M) CR tablet 40 mEq (has no administration in time range)  levETIRAcetam (KEPPRA) IVPB 1000 mg/100 mL premix (0 mg Intravenous Stopped 06/06/22 1433)  sodium chloride 0.9 % bolus 500 mL (500 mLs Intravenous New Bag/Given 06/06/22 1347)    ED Course/ Medical Decision Making/ A&P Clinical Course as of 06/06/22 1606  Fri Jun 06, 2022  1602 88 yo M with seizures on keppra pw with possible seizure from his facility. Was hypernatremic and treated with fluids and abx for uti. Had possible seizure today. Na still high but improved. CT head showed possible bleed. Discussed GOC with daughter. Not on full hospice but did palliative care consult. Daugther would like for him to not be admitted. Will discuss with NSGY and likely dc afterwards.  [RP]    Clinical Course User Index [RP] Fransico Meadow, MD                           Medical Decision Making Amount and/or Complexity of Data Reviewed Labs: ordered. Radiology: ordered. ECG/medicine tests: ordered.  Risk Prescription drug management.   Patient is a 88 year old who presents with some increased weakness and potential possible seizure this morning.  On chart review, he was treated for 10 days with IV Rocephin for recent UTI.  Cultures did grow out Proteus which is susceptible Rocephin.  He was given IV fluids for hypernatremia.  On labs that were done here in the ED, his sodium is improving.  Still slightly elevated but improved.  His WBC count is elevated but markedly improved from recent values after treatment of his UTI.  He had a  chest x-ray which shows no evidence of pneumonia.  His COVID/flu  test are negative.  He has had no further seizure activity.  His urinalysis does not appear to be infected currently.  CT scan shows a possible small hemorrhage in his thalamic area.  Daughter is now at bedside.  She said that they recently done a palliative care consult and are moving toward nonaggressive care for the patient.  She is requesting that patient be transferred back to the nursing facility.  She is the patient's healthcare power of attorney and has decided and feels that her dad would be on board that he does not want any aggressive treatment.  He can continue giving IV fluids as needed at the nursing facility.  His potassium is little low here and he was given dose of potassium.  Will continue to be monitored at nursing facility.  He has not had any further seizure activity.  I did mention that potentially could have had a new stroke.  She does not want to get any further imaging because they do not feel like they would do anything about it anyway.  I feel that this is reasonable.  Patient is nonambulatory at baseline.  The daughter says that he is pretty much back to his baseline mental status currently.  Will plan on discharge back to the nursing facility.  Awaiting callback from neurosurgery to given evaluation on the possible hemorrhage on his CT.  Dr. Philip Aspen to take the call.  Will likely be discharged following this as daughter has indicated that she does not want any aggressive care.  Final Clinical Impression(s) / ED Diagnoses Final diagnoses:  Generalized weakness  Dehydration  Hypokalemia    Rx / DC Orders ED Discharge Orders     None         Malvin Johns, MD 06/06/22 1530    Malvin Johns, MD 06/06/22 [phone]

## 2022-06-07 LAB — URINE CULTURE: Culture: NO GROWTH

## 2022-06-10 ENCOUNTER — Encounter: Payer: Self-pay | Admitting: Nurse Practitioner

## 2022-06-10 ENCOUNTER — Non-Acute Institutional Stay (SKILLED_NURSING_FACILITY): Payer: Medicare HMO | Admitting: Nurse Practitioner

## 2022-06-10 DIAGNOSIS — N138 Other obstructive and reflux uropathy: Secondary | ICD-10-CM

## 2022-06-10 DIAGNOSIS — N401 Enlarged prostate with lower urinary tract symptoms: Secondary | ICD-10-CM

## 2022-06-10 DIAGNOSIS — L8996 Pressure-induced deep tissue damage of unspecified site: Secondary | ICD-10-CM

## 2022-06-10 DIAGNOSIS — N1831 Chronic kidney disease, stage 3a: Secondary | ICD-10-CM | POA: Diagnosis not present

## 2022-06-10 DIAGNOSIS — R1312 Dysphagia, oropharyngeal phase: Secondary | ICD-10-CM

## 2022-06-10 DIAGNOSIS — E870 Hyperosmolality and hypernatremia: Secondary | ICD-10-CM | POA: Diagnosis not present

## 2022-06-10 DIAGNOSIS — R569 Unspecified convulsions: Secondary | ICD-10-CM

## 2022-06-10 DIAGNOSIS — I10 Essential (primary) hypertension: Secondary | ICD-10-CM | POA: Diagnosis not present

## 2022-06-10 DIAGNOSIS — I482 Chronic atrial fibrillation, unspecified: Secondary | ICD-10-CM | POA: Diagnosis not present

## 2022-06-10 DIAGNOSIS — M25661 Stiffness of right knee, not elsewhere classified: Secondary | ICD-10-CM | POA: Diagnosis not present

## 2022-06-10 DIAGNOSIS — K219 Gastro-esophageal reflux disease without esophagitis: Secondary | ICD-10-CM

## 2022-06-10 DIAGNOSIS — Z8673 Personal history of transient ischemic attack (TIA), and cerebral infarction without residual deficits: Secondary | ICD-10-CM

## 2022-06-10 DIAGNOSIS — M6281 Muscle weakness (generalized): Secondary | ICD-10-CM | POA: Diagnosis not present

## 2022-06-10 DIAGNOSIS — M25662 Stiffness of left knee, not elsewhere classified: Secondary | ICD-10-CM | POA: Diagnosis not present

## 2022-06-10 DIAGNOSIS — R41841 Cognitive communication deficit: Secondary | ICD-10-CM | POA: Diagnosis not present

## 2022-06-10 DIAGNOSIS — I5032 Chronic diastolic (congestive) heart failure: Secondary | ICD-10-CM

## 2022-06-10 DIAGNOSIS — I619 Nontraumatic intracerebral hemorrhage, unspecified: Secondary | ICD-10-CM | POA: Diagnosis not present

## 2022-06-10 DIAGNOSIS — I872 Venous insufficiency (chronic) (peripheral): Secondary | ICD-10-CM | POA: Diagnosis not present

## 2022-06-10 NOTE — Assessment & Plan Note (Signed)
small brain bleed CT head , f/u neurology 06/13/22, off ASA, 75m posterior right thalamus

## 2022-06-10 NOTE — Assessment & Plan Note (Signed)
blood pressure is controled on amlodipine, Lisinopril.

## 2022-06-10 NOTE — Assessment & Plan Note (Signed)
No active seizure activities since ED visit, takes Keppra, F/u neurology

## 2022-06-10 NOTE — Assessment & Plan Note (Signed)
,   R foot/ankle: lateral heel, lateral mid foot, medial malleolus. L foot/ankle: lateral/medial heel, medial malleolus.

## 2022-06-10 NOTE — Progress Notes (Signed)
Location:  [facility] Room Number: NO/61/A Place of Service:  SNF (31) Provider:  Ardyth Kelso X, NP   Patient Care Team: Wardell Honour, MD as PCP - General (Family Medicine) Gatha Mayer, MD as Consulting Physician (Gastroenterology) [name], [name], MD as Consulting Physician (Cardiology) [name], MD as Attending Physician (Internal Medicine) Kaito Schulenburg X, NP as Nurse Practitioner (Internal Medicine)  Extended Emergency Contact Information Primary Emergency Contact: Daughtrey,[name] Address: [place], [place] [address] [name] Phone: [phone] Mobile Phone: [phone] Relation: Daughter Secondary Emergency Contact: Faiola,[name] Address: [place] [address] [place] Phone: [phone] Mobile Phone: [phone] Relation: Son  Code Status:  DNR Goals of care: Advanced Directive information    05/28/2022    1:20 PM  Advanced Directives  Does Patient Have a Medical Advance Directive? Yes  Type of Paramedic of [place];Living will;Out of facility DNR (pink MOST or yellow form)  Does patient want to make changes to medical advance directive? No - Patient declined  Copy of [place] in Chart? Yes - validated most recent copy scanned in chart (See row information)  Pre-existing out of facility DNR order (yellow form or pink MOST form) Yellow form placed in chart (order not valid for inpatient use)     Chief Complaint  Patient presents with   Acute Visit    Patient is here for follow up after hospital stay     HPI:  Pt is a 88 y.o. male seen today for an acute visit for follow up hospital stay  Hospitalized for fall 06/07/22,  small brain bleed CT head , f/u neurology 06/13/22, off ASA, 67m posterior right thalamus             Hypernatremia, Na 161>>148 06/06/22              Pressure  ulcers, R foot/ankle: lateral heel, lateral mid foot, medial malleolus. L foot/ankle: lateral/medial heel, medial malleolus.              UTI fully treated with 10 day course of Rocephin.              Dysphagia, nectar thick.              TIA, resolved right hand numbness, weakness, difficulty feeding self, off ASA 2/2 brain bleed, off Statin, declined neurology consultation.              Recurrent falls, due to unsteady on his legs, attempting standing up or transfer with no assistance.              BPH, saw urology. Chronic cystitis              PVD, BLE brownish erythematous pigmentation, no further swelling, off Furosemide, Hx of venous stasis ulcers.              Afib, onset 09/2019, heart rate is controlled, off ASA             CHF, EF 617-51% grade 1 diastolic dysfunction, off Furosemide, saw Cardiology             HTN, blood pressure is controled on amlodipine, Lisinopril.             Hx of CAD, hyperlipidemia, off Crestor, ASA. LDL 61 07/18/21.  10/09/20 EKG SR vent rate 65, no significant ST-T changes.              GERD, takes Omeprazole.              Insomnia, stable, off Trazodone.              Chronic lower back pain/right hip pain,  off Gabapentin             OA, s/p left hip surgery, chronic right hip pain, w/c helps, prn Tylenol             Seizure: takes Keppra, F/u neurology    Past Medical History:  Diagnosis Date   Allergic rhinitis    Amnesia    Atherosclerotic heart disease of native coronary artery without angina pectoris    BPH (benign prostatic hyperplasia)    Bradycardia    Chronic diastolic (congestive) heart failure (HCC)    Colon polyps    Coronary artery disease    MODERATE   Dizziness    Edema of lower extremity    Erectile dysfunction    H. pylori infection    Hx of    Hard of hearing    Headache(784.0)    History of colon polyps 2009   Dr. Orr/Colonoscopy -small cecal adenoma   History of falling    Hx of colonoscopy 2009   Dr Lajoyce Corners, hemorrhoids &  polyps   Hyperlipidemia    Hypertension    Hypertensive heart disease with heart failure ([place])    Hypokalemia    Internal hemorrhoids 2009   Dr. Lajoyce Corners Colonoscopy   Metabolic encephalopathy    Mitral valve regurgitation    Muscle weakness (generalized)    Pneumonia    Primary generalized (osteo)arthritis    Reflux esophagitis    Sepsis ([place]) 11/04/2018   Unspecified hemorrhoids    Unsteadiness on feet    Vitamin D deficiency    Past Surgical History:  Procedure Laterality Date   APPENDECTOMY     CARDIAC CATHETERIZATION  1997   REVEALED MILD TO MODERATE IRREGULARITIES. HIS LEFT EVNTRICULAR SYSTOLIC FUNCTION REVEALED NORMAL EJECTION FRACTION WITH EF OF 70%   CATARACT EXTRACTION, BILATERAL     COLONOSCOPY  multiple   fatty tumor removal     benign   HEMORROIDECTOMY     HIP ARTHROPLASTY Left 08/17/2013   Procedure: ARTHROPLASTY BIPOLAR HIP;  Surgeon: Gearlean Alf, MD;  Location: [facility];  Service: Orthopedics;  Laterality: Left;   INGUINAL HERNIA REPAIR Bilateral 2006   lower back surgery  2006   NASAL SEPTUM SURGERY     TONSILLECTOMY     UPPER GASTROINTESTINAL ENDOSCOPY      No Known Allergies  Outpatient Encounter Medications as of 06/10/2022  Medication Sig   acetaminophen (TYLENOL) 500 MG tablet Take 500 mg by mouth every 8 (eight) hours as needed (pain).   amLODipine (NORVASC) 5 MG tablet Take 5 mg by mouth in the morning. (0800)   Carboxymethylcellulose Sodium (ARTIFICIAL TEARS OP) Place 1 drop into both eyes See admin instructions. 2 entries on MAR: 1 drop into each eye twice daily (0700,2000) + 1 drop into each eye as needed for dry eyes/itching   Emollient (CERAVE) CREA Apply 1 application  topically See admin instructions. 2 entries on MAR: Apply topically to bilateral legs every M-W-F(1600) + apply topically to both extremities at bedtime   Hydroactive Dressings (ULTEC HYDROCOLLOID DRESSING EX) Apply 1 application  topically daily at 6 PM. To right mid foot  hydrocortisone (PROCTO-MED HC) 2.5 % rectal cream Place 1 Application rectally every 12 (twelve) hours as needed for hemorrhoids.   levETIRAcetam (KEPPRA) 500 MG tablet Take 500 mg by mouth 2 (two) times daily. (0800,2000)   lisinopril (ZESTRIL) 40 MG tablet Take 40 mg by mouth in the morning. (0800)   omeprazole (PRILOSEC) 20 MG capsule Take 20 mg by mouth in the morning. (0800)   [DISCONTINUED] cefTRIAXone (ROCEPHIN) 1 g injection Inject 1 g into the muscle daily. (05/27/22-06/04/22) (Patient not taking: Reported on 06/06/2022)   [DISCONTINUED] DEXTROSE IV Inject 75 mL/hr into the vein 2 (two) times daily. (0700,1600)   No facility-administered encounter medications on file as of 06/10/2022.    Review of Systems  Constitutional:  Positive for activity change, appetite change and fatigue. Negative for fever.  HENT:  Positive for hearing loss. Negative for congestion and voice change.   Eyes:  Negative for visual disturbance.  Respiratory:  Negative for cough, shortness of breath and wheezing.   Cardiovascular:  Negative for leg swelling.  Gastrointestinal:  Negative for constipation.       ? Suprapubic area discomfort.   Genitourinary:  Positive for frequency. Negative for difficulty urinating and urgency.  Musculoskeletal:  Positive for arthralgias, back pain and gait problem.       Lower back, right hip/thigh pain   Skin:  Positive for wound.       Chronic venous insufficiency skin changes BLE, pressure wounds feet/ankles.   Neurological:  Negative for seizures, weakness and headaches.       Memory lapses. Tingling in toes. Right hand weakness/numbness is improved, placed ASA 325 11/13/20  Psychiatric/Behavioral:  Negative for behavioral problems and sleep disturbance. The patient is not nervous/anxious.     Immunization History  Administered Date(s) Administered   Influenza, High Dose Seasonal PF 03/19/2019, 03/19/2019, 04/14/2022   Influenza-Unspecified 04/08/2016, 03/27/2020,  04/03/2021   Moderna Sars-Covid-2 Vaccination 06/18/2019, 07/16/2019, 04/24/2020, 11/13/2020   Pfizer Covid-19 Vaccine Bivalent Booster 42yr & up 03/05/2021   Pneumococcal Conjugate-13 06/02/2013   Pneumococcal Polysaccharide-23 04/07/2001   Tetanus 08/14/2013   Zoster Recombinat (Shingrix) 08/03/2021   Pertinent  Health Maintenance Due  Topic Date Due   INFLUENZA VACCINE  Completed      09/22/2019    9:00 PM 09/23/2019    9:30 AM 01/12/2020    3:02 AM 04/04/2022    9:23 AM 06/06/2022   11:39 AM  Fall Risk  Falls in the past year?    0   Was there an injury with Fall?    0   Fall Risk Category Calculator    0   Fall Risk Category    Low   Patient Fall Risk Level High fall risk High fall risk High fall risk Low fall risk Low fall risk  Patient at Risk for Falls Due to    History of fall(s);Impaired balance/gait   Fall risk Follow up    Falls evaluation completed    Functional Status Survey:    Vitals:   06/10/22 1138  BP: 129/83  Pulse: 60  Resp: 18  Temp: 98.1 F (36.7 C)  SpO2: 93%  Weight: 182 lb 9.6 oz (82.8 kg)  Height: '5\' 7"'$  (1.702 m)   Body mass index is 28.6 kg/m. Physical Exam Vitals and nursing note reviewed.  Constitutional:      Comments: Generalized weakness, able to be aroused when spoke to, followed simple directions and answered questions.   HENT:     Head: Normocephalic and atraumatic.  Mouth/Throat:     Mouth: Mucous membranes are moist.  Eyes:     Conjunctiva/sclera: Conjunctivae normal.     Pupils: Pupils are equal, round, and reactive to light.  Cardiovascular:     Rate and Rhythm: Normal rate and regular rhythm.     Heart sounds: Murmur heard.  Pulmonary:     Breath sounds: No rales.  Abdominal:     General: Bowel sounds are normal.     Palpations: Abdomen is soft.     Tenderness: There is no guarding.     Comments: ? Suprapubic area discomfort/tenderness.   Genitourinary:    Comments: External hemorrhoids, no bleeding or injury.   Musculoskeletal:     Cervical back: Normal range of motion and neck supple. No muscular tenderness.     Right lower leg: No edema.     Left lower leg: No edema.     Comments: Bed rest now  Skin:    General: Skin is warm and dry.     Comments: Chronic venous insufficiency skin changes BLE.  Pressure ulcers: R foot/ankle: lateral heel, lateral mid foot, medial malleolus. L foot/ankle: lateral/medial heel, medial malleolus.  Neurological:     General: No focal deficit present.     Mental Status: He is alert. Mental status is at baseline.     Motor: No weakness.     Coordination: Coordination normal.     Gait: Gait abnormal.     Comments: Oriented to person  Psychiatric:        Mood and Affect: Mood normal.        Behavior: Behavior normal.     Labs reviewed: Recent Labs    10/15/21 0000 11/21/21 0000 05/26/22 0000 06/06/22 1212 06/06/22 1258  NA 143 143 140 154* 148*  K 5.1 4.1 4.4 3.4* 3.2*  CL 106 108 105 117* 119*  CO2 24* 30* 25*  --  25  GLUCOSE  --   --   --  129* 133*  BUN 35* 22* 28* 18 18  CREATININE 1.3 1.1  --  0.90 1.06  CALCIUM 9.6 8.7  --   --  7.9*   Recent Labs    07/18/21 0000 05/26/22 0000 06/06/22 1258  AST 19 85* 32  ALT 17 96* 55*  ALKPHOS 61  --  149*  BILITOT  --   --  1.0  PROT  --   --  5.8*  ALBUMIN 3.9 3.4* 2.3*   Recent Labs    12/19/21 0000 05/26/22 0000 06/06/22 1212 06/06/22 1258  WBC 8.2 34.0  --  16.1*  NEUTROABS 4,674.00 31,790.00  --  13.3*  HGB 12.5* 11.3* 11.9* 12.1*  HCT 36* 32* 35.0* 39.4  MCV  --   --   --  104.2*  PLT 218 172  --  297   Lab Results  Component Value Date   TSH 1.77 04/06/2020   No results found for: "HGBA1C" Lab Results  Component Value Date   CHOL 131 07/18/2021   HDL 57 07/18/2021   LDLCALC 61 07/18/2021   TRIG 57 07/18/2021   CHOLHDL 2.7 02/17/2017    Significant Diagnostic Results in last 30 days:  CT Head Wo Contrast  Result Date: 06/06/2022 CLINICAL DATA:  Small focus of  hemorrhage in right thalamus seen in the earlier study EXAM: CT HEAD WITHOUT CONTRAST TECHNIQUE: Contiguous axial images were obtained from the base of the skull through the vertex without intravenous contrast. RADIATION DOSE REDUCTION: This exam was performed  according to the departmental dose-optimization program which includes automated exposure control, adjustment of the mA and/or kV according to patient size and/or use of iterative reconstruction technique. COMPARISON:  Previous examinations including the CT brain done earlier today FINDINGS: Brain: There is 5 mm area of increased density in the right thalamus which has not changed significantly since the examination done earlier today. There is no significant adjacent edema or mass effect. There are no new abnormal foci of bleeding within the cranium. Cortical sulci are prominent. There is decreased density in periventricular white matter. Calcifications are seen in the falx. Vascular: Unremarkable. Skull: Unremarkable. Sinuses/Orbits: Unremarkable. Other: None. IMPRESSION: There is 5 mm focus of increased density in posterior right thalamus, possibly small focus of intraparenchymal hemorrhage. This finding appears stable. Possibility of calcifications accounting for this high density focus is not excluded. There is no adjacent edema or mass effect. Follow-up MRI as clinically warranted may be considered. No new focal abnormalities are seen. Atrophy. Small-vessel disease. Electronically Signed   By: Elmer Picker M.D.   On: 06/06/2022 19:03   CT Head Wo Contrast  Result Date: 06/06/2022 CLINICAL DATA:  Mental status change, unknown cause EXAM: CT HEAD WITHOUT CONTRAST TECHNIQUE: Contiguous axial images were obtained from the base of the skull through the vertex without intravenous contrast. RADIATION DOSE REDUCTION: This exam was performed according to the departmental dose-optimization program which includes automated exposure control, adjustment  of the mA and/or kV according to patient size and/or use of iterative reconstruction technique. COMPARISON:  01/12/20 FINDINGS: Brain: Small (5 mm) focus of hyperdensity in the right thalamus is new since the prior. No evidence of acute large vascular territory infarction, hydrocephalus, extra-axial collection or mass lesion/mass effect. Vascular: No hyperdense vessel. Skull: No acute fracture. Sinuses/Orbits: Clear sinuses.  No acute orbital findings. Other: No mastoid effusions. IMPRESSION: Small (5 mm) hyperdensity in the right thalamus appears new from prior and is suspicious for small hemorrhage. A small focus of mineralization is a differential consideration. A follow-up CT is recommended to ensure stability. Findings discussed with provider BELFI at 12:30 PM via telephone. Electronically Signed   By: Margaretha Sheffield M.D.   On: 06/06/2022 12:31   DG Chest Port 1 View  Result Date: 06/06/2022 CLINICAL DATA:  Questionable sepsis - evaluate for abnormality EXAM: PORTABLE CHEST 1 VIEW COMPARISON:  09/19/2019 FINDINGS: Low lung volumes, bibasilar atelectasis. Heart is normal size. No effusions or acute bony abnormality. IMPRESSION: Low lung volumes, bibasilar atelectasis. Electronically Signed   By: Rolm Baptise M.D.   On: 06/06/2022 12:29    Assessment/Plan Bleeding in brain [facility]) small brain bleed CT head , f/u neurology 06/13/22, off ASA, 79m posterior right thalamus  History of TIA (transient ischemic attack) resolved right hand numbness, weakness, difficulty feeding self, off ASA 2/2 brain bleed, on Statin, declined neurology consultation.   Hypernatremia Na 161>>148 06/06/22   Decubitus ulcer with suspected deep tissue injury , R foot/ankle: lateral heel, lateral mid foot, medial malleolus. L foot/ankle: lateral/medial heel, medial malleolus.   Dysphagia, oropharyngeal phase nectar thick.   BPH (benign prostatic hyperplasia) saw urology. Chronic cystitis  Venous insufficiency  (chronic) (peripheral) BLE brownish erythematous pigmentation, no further swelling, off Furosemide, Hx of venous stasis ulcers.   Chronic atrial fibrillation ([facility] [place]  onset 09/2019, heart rate is controlled, off ASA  Chronic diastolic heart failure (HCC) EF 671-06% grade 1 diastolic dysfunction, off Furosemide, saw Cardiology  Hypertension blood pressure is controled on amlodipine, Lisinopril.  GERD (gastroesophageal reflux disease)  takes Omeprazole.   Seizure ([place]) No active seizure activities since ED visit, takes Keppra, F/u neurology      Family/ staff Communication: plan of care reviewed with the patient and charge nurse.   Labs/tests ordered:  none  Time spend 35 minutes.

## 2022-06-10 NOTE — Assessment & Plan Note (Signed)
nectar thick.

## 2022-06-10 NOTE — Assessment & Plan Note (Signed)
onset 09/2019, heart rate is controlled, off ASA

## 2022-06-10 NOTE — Assessment & Plan Note (Signed)
EF 80-99%, grade 1 diastolic dysfunction, off Furosemide, saw Cardiology

## 2022-06-10 NOTE — Assessment & Plan Note (Signed)
resolved right hand numbness, weakness, difficulty feeding self, off ASA 2/2 brain bleed, on Statin, declined neurology consultation.

## 2022-06-10 NOTE — Assessment & Plan Note (Signed)
saw urology. Chronic cystitis

## 2022-06-10 NOTE — Assessment & Plan Note (Signed)
takes Omeprazole 

## 2022-06-10 NOTE — Assessment & Plan Note (Signed)
BLE brownish erythematous pigmentation, no further swelling, off Furosemide, Hx of venous stasis ulcers.

## 2022-06-10 NOTE — Assessment & Plan Note (Signed)
Na 161>>148 06/06/22

## 2022-06-10 NOTE — Progress Notes (Signed)
This encounter was created in error - please disregard.

## 2022-06-11 DIAGNOSIS — M6281 Muscle weakness (generalized): Secondary | ICD-10-CM | POA: Diagnosis not present

## 2022-06-11 DIAGNOSIS — R41841 Cognitive communication deficit: Secondary | ICD-10-CM | POA: Diagnosis not present

## 2022-06-11 DIAGNOSIS — R1312 Dysphagia, oropharyngeal phase: Secondary | ICD-10-CM | POA: Diagnosis not present

## 2022-06-11 DIAGNOSIS — M25662 Stiffness of left knee, not elsewhere classified: Secondary | ICD-10-CM | POA: Diagnosis not present

## 2022-06-11 DIAGNOSIS — M25661 Stiffness of right knee, not elsewhere classified: Secondary | ICD-10-CM | POA: Diagnosis not present

## 2022-06-11 LAB — CULTURE, BLOOD (ROUTINE X 2)
Culture: NO GROWTH
Culture: NO GROWTH
Special Requests: ADEQUATE

## 2022-06-12 DIAGNOSIS — R41841 Cognitive communication deficit: Secondary | ICD-10-CM | POA: Diagnosis not present

## 2022-06-12 DIAGNOSIS — M25661 Stiffness of right knee, not elsewhere classified: Secondary | ICD-10-CM | POA: Diagnosis not present

## 2022-06-12 DIAGNOSIS — R1312 Dysphagia, oropharyngeal phase: Secondary | ICD-10-CM | POA: Diagnosis not present

## 2022-06-12 DIAGNOSIS — M25662 Stiffness of left knee, not elsewhere classified: Secondary | ICD-10-CM | POA: Diagnosis not present

## 2022-06-12 DIAGNOSIS — M6281 Muscle weakness (generalized): Secondary | ICD-10-CM | POA: Diagnosis not present

## 2022-06-13 DIAGNOSIS — R41841 Cognitive communication deficit: Secondary | ICD-10-CM | POA: Diagnosis not present

## 2022-06-13 DIAGNOSIS — M25661 Stiffness of right knee, not elsewhere classified: Secondary | ICD-10-CM | POA: Diagnosis not present

## 2022-06-13 DIAGNOSIS — M25662 Stiffness of left knee, not elsewhere classified: Secondary | ICD-10-CM | POA: Diagnosis not present

## 2022-06-13 DIAGNOSIS — E876 Hypokalemia: Secondary | ICD-10-CM | POA: Diagnosis not present

## 2022-06-13 DIAGNOSIS — N1831 Chronic kidney disease, stage 3a: Secondary | ICD-10-CM | POA: Diagnosis not present

## 2022-06-13 DIAGNOSIS — R1312 Dysphagia, oropharyngeal phase: Secondary | ICD-10-CM | POA: Diagnosis not present

## 2022-06-13 DIAGNOSIS — M6281 Muscle weakness (generalized): Secondary | ICD-10-CM | POA: Diagnosis not present

## 2022-06-16 DIAGNOSIS — R1312 Dysphagia, oropharyngeal phase: Secondary | ICD-10-CM | POA: Diagnosis not present

## 2022-06-16 DIAGNOSIS — R41841 Cognitive communication deficit: Secondary | ICD-10-CM | POA: Diagnosis not present

## 2022-06-17 DIAGNOSIS — R41841 Cognitive communication deficit: Secondary | ICD-10-CM | POA: Diagnosis not present

## 2022-06-17 DIAGNOSIS — R1312 Dysphagia, oropharyngeal phase: Secondary | ICD-10-CM | POA: Diagnosis not present

## 2022-06-18 DIAGNOSIS — R1312 Dysphagia, oropharyngeal phase: Secondary | ICD-10-CM | POA: Diagnosis not present

## 2022-06-18 DIAGNOSIS — R41841 Cognitive communication deficit: Secondary | ICD-10-CM | POA: Diagnosis not present

## 2022-06-21 DIAGNOSIS — M545 Low back pain, unspecified: Secondary | ICD-10-CM | POA: Diagnosis not present

## 2022-06-23 ENCOUNTER — Encounter: Payer: Self-pay | Admitting: Nurse Practitioner

## 2022-06-23 ENCOUNTER — Non-Acute Institutional Stay (SKILLED_NURSING_FACILITY): Payer: Medicare HMO | Admitting: Nurse Practitioner

## 2022-06-23 DIAGNOSIS — I10 Essential (primary) hypertension: Secondary | ICD-10-CM

## 2022-06-23 DIAGNOSIS — R569 Unspecified convulsions: Secondary | ICD-10-CM

## 2022-06-23 DIAGNOSIS — L8996 Pressure-induced deep tissue damage of unspecified site: Secondary | ICD-10-CM | POA: Diagnosis not present

## 2022-06-23 DIAGNOSIS — R627 Adult failure to thrive: Secondary | ICD-10-CM

## 2022-06-23 DIAGNOSIS — I482 Chronic atrial fibrillation, unspecified: Secondary | ICD-10-CM | POA: Diagnosis not present

## 2022-06-23 DIAGNOSIS — I619 Nontraumatic intracerebral hemorrhage, unspecified: Secondary | ICD-10-CM | POA: Diagnosis not present

## 2022-06-23 DIAGNOSIS — K219 Gastro-esophageal reflux disease without esophagitis: Secondary | ICD-10-CM

## 2022-07-02 ENCOUNTER — Telehealth: Payer: Self-pay | Admitting: Neurology

## 2022-07-02 NOTE — Telephone Encounter (Signed)
Patient Deceased

## 2022-07-03 NOTE — Telephone Encounter (Signed)
Sympathy card sent to family at address.

## 2022-07-09 ENCOUNTER — Inpatient Hospital Stay: Payer: Medicare HMO | Admitting: Neurology

## 2022-07-17 NOTE — Assessment & Plan Note (Signed)
onset 09/2019, heart rate is controlled, off ASA

## 2022-07-17 NOTE — Assessment & Plan Note (Signed)
takes Omeprazole 

## 2022-07-17 NOTE — Progress Notes (Signed)
Location:  [facility] Room Number: NO/61/A Place of Service:  Nursing (32) Provider:  Matias Thurman X, NP  Patient Care Team: Wardell Honour, MD as PCP - General (Family Medicine) Gatha Mayer, MD as Consulting Physician (Gastroenterology) [name], [name], MD as Consulting Physician (Cardiology) [name], MD as Attending Physician (Internal Medicine) Janiylah Hannis X, NP as Nurse Practitioner (Internal Medicine)  Extended Emergency Contact Information Primary Emergency Contact: Eckardt,[name] Address: [place], [place] [address] [name] Phone: [phone] Mobile Phone: [phone] Relation: Daughter Secondary Emergency Contact: Ng,[name] Address: [place] [address] [place] Phone: [phone] Mobile Phone: [phone] Relation: Son  Code Status:  DNR Goals of care: Advanced Directive information    05/28/2022    1:20 PM  Advanced Directives  Does Patient Have a Medical Advance Directive? Yes  Type of Paramedic of [place];Living will;Out of facility DNR (pink MOST or yellow form)  Does patient want to make changes to medical advance directive? No - Patient declined  Copy of [place] in Chart? Yes - validated most recent copy scanned in chart (See row information)  Pre-existing out of facility DNR order (yellow form or pink MOST form) Yellow form placed in chart (order not valid for inpatient use)     Chief Complaint  Patient presents with   Medical Management of Chronic Issues    Patient is here for a follow up for chronic conditions     HPI:  Pt is a 89 y.o. male seen today for medical management of chronic diseases.    Adult failure to thrive,  comfort measures, Morphine prn, Atropine prn   Hospitalized for fall 06/07/22,  small brain bleed CT head , f/u neurology 06/13/22, off ASA,  30m posterior right thalamus             Hypernatremia, Na 161>>148 06/06/22              Pressure ulcers/DTI: 06/19/22 DTI R lateral heel-skin prep, left heel-skin prep. Unstageable: R lateral heel, R lateral distal midfoot, R medial malleolus, L lateral malleolus. Stage 2 PU x2, L medial malleolus-hydrocolloid dressing.              Afib, onset 09/2019, heart rate is controlled, off ASA             HTN, on amlodipine, Lisinopril             GERD, takes Omeprazole.             Seizure: takes Keppra, F/u neurology      Past Medical History:  Diagnosis Date   Allergic rhinitis    Amnesia    Atherosclerotic heart disease of native coronary artery without angina pectoris    BPH (benign prostatic hyperplasia)    Bradycardia    Chronic diastolic (congestive) heart failure (HCC)    Colon polyps    Coronary artery disease    MODERATE   Dizziness    Edema of lower extremity    Erectile dysfunction    H. pylori infection    Hx of    Hard of hearing    Headache(784.0)    History of colon polyps 2009   Dr. Orr/Colonoscopy -small cecal adenoma   History of falling    Hx of colonoscopy 2009  Dr Lajoyce Corners, hemorrhoids & polyps   Hyperlipidemia    Hypertension    Hypertensive heart disease with heart failure [facility])    Hypokalemia    Internal hemorrhoids 2009   Dr. Lajoyce Corners Colonoscopy   Metabolic encephalopathy    Mitral valve regurgitation    Muscle weakness (generalized)    Pneumonia    Primary generalized (osteo)arthritis    Reflux esophagitis    Sepsis ([facility]) 11/04/2018   Unspecified hemorrhoids    Unsteadiness on feet    Vitamin D deficiency    Past Surgical History:  Procedure Laterality Date   APPENDECTOMY     CARDIAC CATHETERIZATION  1997   REVEALED MILD TO MODERATE IRREGULARITIES. HIS LEFT EVNTRICULAR SYSTOLIC FUNCTION REVEALED NORMAL EJECTION FRACTION WITH EF OF 70%   CATARACT EXTRACTION, BILATERAL     COLONOSCOPY  multiple   fatty tumor removal     benign   HEMORROIDECTOMY      HIP ARTHROPLASTY Left 08/17/2013   Procedure: ARTHROPLASTY BIPOLAR HIP;  Surgeon: Gearlean Alf, MD;  Location: [facility];  Service: Orthopedics;  Laterality: Left;   INGUINAL HERNIA REPAIR Bilateral 2006   lower back surgery  2006   NASAL SEPTUM SURGERY     TONSILLECTOMY     UPPER GASTROINTESTINAL ENDOSCOPY      No Known Allergies  Outpatient Encounter Medications as of 07/08/2022  Medication Sig   acetaminophen (TYLENOL) 500 MG tablet Take 500 mg by mouth every 8 (eight) hours as needed (pain).   amLODipine (NORVASC) 5 MG tablet Take 5 mg by mouth in the morning. (0800)   Carboxymethylcellulose Sodium (ARTIFICIAL TEARS OP) Place 1 drop into both eyes See admin instructions. 2 entries on MAR: 1 drop into each eye twice daily (0700,2000) + 1 drop into each eye as needed for dry eyes/itching   Emollient (CERAVE) CREA Apply 1 application  topically See admin instructions. 2 entries on MAR: Apply topically to bilateral legs every M-W-F(1600) + apply topically to both extremities at bedtime   Hydroactive Dressings (ULTEC HYDROCOLLOID DRESSING EX) Apply 1 application  topically daily at 6 PM. To right mid foot   levETIRAcetam (KEPPRA) 500 MG tablet Take 500 mg by mouth 2 (two) times daily. (0800,2000)   lisinopril (ZESTRIL) 40 MG tablet Take 40 mg by mouth in the morning. (0800)   omeprazole (PRILOSEC) 20 MG capsule Take 20 mg by mouth in the morning. (0800)   [DISCONTINUED] hydrocortisone (PROCTO-MED HC) 2.5 % rectal cream Place 1 Application rectally every 12 (twelve) hours as needed for hemorrhoids.   No facility-administered encounter medications on file as of 07/06/2022.    Review of Systems  Unable to perform ROS: Patient unresponsive    Immunization History  Administered Date(s) Administered   Influenza, High Dose Seasonal PF 03/19/2019, 03/19/2019, 04/14/2022   Influenza-Unspecified 04/08/2016, 03/27/2020, 04/03/2021   Moderna Sars-Covid-2 Vaccination 06/18/2019, 07/16/2019,  04/24/2020, 11/13/2020   Pfizer Covid-19 Vaccine Bivalent Booster 45yr & up 03/05/2021, 04/17/2022   Pneumococcal Conjugate-13 06/02/2013   Pneumococcal Polysaccharide-23 04/07/2001   Tdap 08/14/2013   Tetanus 08/14/2013   Zoster Recombinat (Shingrix) 08/03/2021, 10/18/2021   Pertinent  Health Maintenance Due  Topic Date Due   INFLUENZA VACCINE  Completed      09/23/2019    9:30 AM 01/12/2020    3:02 AM 04/04/2022    9:23 AM 06/06/2022   11:39 AM 07/04/2022    9:39 AM  Fall Risk  Falls in the past year?   0  1  Was there an injury  with Fall?   0  0  Fall Risk Category Calculator   0  1  Fall Risk Category   Low  Low  Patient Fall Risk Level High fall risk High fall risk Low fall risk Low fall risk Low fall risk  Patient at Risk for Falls Due to   History of fall(s);Impaired balance/gait  History of fall(s)  Fall risk Follow up   Falls evaluation completed  Falls evaluation completed   Functional Status Survey:    Vitals:   06/30/2022 0943  BP: (!) 160/80  Pulse: 88  Resp: (!) 21  Temp: 98.1 F (36.7 C)  SpO2: 93%  Weight: 182 lb 9.6 oz (82.8 kg)  Height: '5\' 7"'$  (1.702 m)   Body mass index is 28.6 kg/m. Physical Exam Vitals and nursing note reviewed.  Constitutional:      Comments: Unresponsive  HENT:     Head: Normocephalic and atraumatic.     Mouth/Throat:     Mouth: Mucous membranes are dry.  Eyes:     Pupils: Pupils are equal, round, and reactive to light.  Cardiovascular:     Rate and Rhythm: Normal rate and regular rhythm.     Heart sounds: Murmur heard.  Pulmonary:     Effort: Pulmonary effort is normal.  Abdominal:     General: Bowel sounds are normal.     Palpations: Abdomen is soft.     Comments: ? Suprapubic area discomfort/tenderness.   Genitourinary:    Comments: External hemorrhoids, no bleeding or injury.  Musculoskeletal:     Cervical back: Normal range of motion and neck supple. No muscular tenderness.     Right lower leg: No edema.      Left lower leg: No edema.  Skin:    General: Skin is warm and dry.     Comments: Chronic venous insufficiency skin changes BLE.  Pressure ulcers: R foot/ankle: lateral heel, lateral mid foot, medial malleolus. L foot/ankle: lateral/medial heel, medial malleolus.  Psychiatric:     Comments: Unresponsive.      Labs reviewed: Recent Labs    10/15/21 0000 11/21/21 0000 05/26/22 0000 06/06/22 1212 06/06/22 1258  NA 143 143 140 154* 148*  K 5.1 4.1 4.4 3.4* 3.2*  CL 106 108 105 117* 119*  CO2 24* 30* 25*  --  25  GLUCOSE  --   --   --  129* 133*  BUN 35* 22* 28* 18 18  CREATININE 1.3 1.1  --  0.90 1.06  CALCIUM 9.6 8.7  --   --  7.9*   Recent Labs    07/18/21 0000 05/26/22 0000 06/06/22 1258  AST 19 85* 32  ALT 17 96* 55*  ALKPHOS 61  --  149*  BILITOT  --   --  1.0  PROT  --   --  5.8*  ALBUMIN 3.9 3.4* 2.3*   Recent Labs    12/19/21 0000 05/26/22 0000 06/06/22 1212 06/06/22 1258  WBC 8.2 34.0  --  16.1*  NEUTROABS 4,674.00 31,790.00  --  13.3*  HGB 12.5* 11.3* 11.9* 12.1*  HCT 36* 32* 35.0* 39.4  MCV  --   --   --  104.2*  PLT 218 172  --  297   Lab Results  Component Value Date   TSH 1.77 04/06/2020   No results found for: "HGBA1C" Lab Results  Component Value Date   CHOL 131 07/18/2021   HDL 57 07/18/2021   LDLCALC 61 07/18/2021   TRIG 57  07/18/2021   CHOLHDL 2.7 02/17/2017    Significant Diagnostic Results in last 30 days:  CT Head Wo Contrast  Result Date: 06/06/2022 CLINICAL DATA:  Small focus of hemorrhage in right thalamus seen in the earlier study EXAM: CT HEAD WITHOUT CONTRAST TECHNIQUE: Contiguous axial images were obtained from the base of the skull through the vertex without intravenous contrast. RADIATION DOSE REDUCTION: This exam was performed according to the departmental dose-optimization program which includes automated exposure control, adjustment of the mA and/or kV according to patient size and/or use of iterative reconstruction  technique. COMPARISON:  Previous examinations including the CT brain done earlier today FINDINGS: Brain: There is 5 mm area of increased density in the right thalamus which has not changed significantly since the examination done earlier today. There is no significant adjacent edema or mass effect. There are no new abnormal foci of bleeding within the cranium. Cortical sulci are prominent. There is decreased density in periventricular white matter. Calcifications are seen in the falx. Vascular: Unremarkable. Skull: Unremarkable. Sinuses/Orbits: Unremarkable. Other: None. IMPRESSION: There is 5 mm focus of increased density in posterior right thalamus, possibly small focus of intraparenchymal hemorrhage. This finding appears stable. Possibility of calcifications accounting for this high density focus is not excluded. There is no adjacent edema or mass effect. Follow-up MRI as clinically warranted may be considered. No new focal abnormalities are seen. Atrophy. Small-vessel disease. Electronically Signed   By: Elmer Picker M.D.   On: 06/06/2022 19:03   CT Head Wo Contrast  Result Date: 06/06/2022 CLINICAL DATA:  Mental status change, unknown cause EXAM: CT HEAD WITHOUT CONTRAST TECHNIQUE: Contiguous axial images were obtained from the base of the skull through the vertex without intravenous contrast. RADIATION DOSE REDUCTION: This exam was performed according to the departmental dose-optimization program which includes automated exposure control, adjustment of the mA and/or kV according to patient size and/or use of iterative reconstruction technique. COMPARISON:  01/12/20 FINDINGS: Brain: Small (5 mm) focus of hyperdensity in the right thalamus is new since the prior. No evidence of acute large vascular territory infarction, hydrocephalus, extra-axial collection or mass lesion/mass effect. Vascular: No hyperdense vessel. Skull: No acute fracture. Sinuses/Orbits: Clear sinuses.  No acute orbital findings.  Other: No mastoid effusions. IMPRESSION: Small (5 mm) hyperdensity in the right thalamus appears new from prior and is suspicious for small hemorrhage. A small focus of mineralization is a differential consideration. A follow-up CT is recommended to ensure stability. Findings discussed with provider BELFI at 12:30 PM via telephone. Electronically Signed   By: Margaretha Sheffield M.D.   On: 06/06/2022 12:31   DG Chest Port 1 View  Result Date: 06/06/2022 CLINICAL DATA:  Questionable sepsis - evaluate for abnormality EXAM: PORTABLE CHEST 1 VIEW COMPARISON:  09/19/2019 FINDINGS: Low lung volumes, bibasilar atelectasis. Heart is normal size. No effusions or acute bony abnormality. IMPRESSION: Low lung volumes, bibasilar atelectasis. Electronically Signed   By: Rolm Baptise M.D.   On: 06/06/2022 12:29    Assessment/Plan Adult failure to thrive comfort measures, Morphine prn, Atropine prn  Bleeding in brain [facility]) Hospitalized for fall 06/07/22,  small brain bleed CT head , f/u neurology 06/13/22, off ASA, 17m posterior right thalamus  Decubitus ulcer with suspected deep tissue injury 06/19/22 DTI R lateral heel-skin prep, left heel-skin prep. Unstageable: R lateral heel, R lateral distal midfoot, R medial malleolus, L lateral malleolus. Stage 2 PU x2, L medial malleolus-hydrocolloid dressing.   Chronic atrial fibrillation ([place] onset 09/2019, heart rate is controlled, off  ASA  Hypertension Mildly elevated, on amlodipine, Lisinopril  GERD (gastroesophageal reflux disease) takes Omeprazole.    Family/ staff Communication: plan of care reviewed with the patient and charge nurse.   Labs/tests ordered: none  Time spend 35 minutes.

## 2022-07-17 NOTE — Assessment & Plan Note (Signed)
comfort measures, Morphine prn, Atropine prn

## 2022-07-17 NOTE — Assessment & Plan Note (Signed)
takes Keppra, F/u neurology

## 2022-07-17 NOTE — Assessment & Plan Note (Signed)
Hospitalized for fall 06/07/22,  small brain bleed CT head , f/u neurology 06/13/22, off ASA, 35m posterior right thalamus

## 2022-07-17 NOTE — Assessment & Plan Note (Signed)
06/19/22 DTI R lateral heel-skin prep, left heel-skin prep. Unstageable: R lateral heel, R lateral distal midfoot, R medial malleolus, L lateral malleolus. Stage 2 PU x2, L medial malleolus-hydrocolloid dressing.

## 2022-07-17 NOTE — Assessment & Plan Note (Signed)
Mildly elevated, on amlodipine, Lisinopril

## 2022-07-17 DEATH — deceased
